# Patient Record
Sex: Male | Born: 1940 | Race: White | Hispanic: No | Smoking: Current every day smoker
Health system: Southern US, Community
[De-identification: ages and names within clinical notes are randomized; demographics above are authoritative.]

## PROBLEM LIST (undated history)

## (undated) DIAGNOSIS — R058 Other specified cough: Secondary | ICD-10-CM

## (undated) DIAGNOSIS — IMO0001 Reserved for inherently not codable concepts without codable children: Secondary | ICD-10-CM

## (undated) DIAGNOSIS — F17210 Nicotine dependence, cigarettes, uncomplicated: Secondary | ICD-10-CM

## (undated) DIAGNOSIS — R05 Cough: Secondary | ICD-10-CM

## (undated) DIAGNOSIS — R918 Other nonspecific abnormal finding of lung field: Secondary | ICD-10-CM

## (undated) DIAGNOSIS — C801 Malignant (primary) neoplasm, unspecified: Secondary | ICD-10-CM

## (undated) DIAGNOSIS — J9383 Other pneumothorax: Secondary | ICD-10-CM

## (undated) DIAGNOSIS — G479 Sleep disorder, unspecified: Secondary | ICD-10-CM

## (undated) DIAGNOSIS — Z85828 Personal history of other malignant neoplasm of skin: Secondary | ICD-10-CM

## (undated) DIAGNOSIS — R634 Abnormal weight loss: Secondary | ICD-10-CM

## (undated) DIAGNOSIS — K4090 Unilateral inguinal hernia, without obstruction or gangrene, not specified as recurrent: Secondary | ICD-10-CM

## (undated) DIAGNOSIS — C3490 Malignant neoplasm of unspecified part of unspecified bronchus or lung: Secondary | ICD-10-CM

## (undated) DIAGNOSIS — K5900 Constipation, unspecified: Secondary | ICD-10-CM

## (undated) DIAGNOSIS — IMO0002 Reserved for concepts with insufficient information to code with codable children: Secondary | ICD-10-CM

## (undated) DIAGNOSIS — I10 Essential (primary) hypertension: Secondary | ICD-10-CM

## (undated) DIAGNOSIS — J939 Pneumothorax, unspecified: Secondary | ICD-10-CM

## (undated) HISTORY — PX: WISDOM TOOTH EXTRACTION: SHX21

## (undated) HISTORY — DX: Other pneumothorax: J93.83

## (undated) HISTORY — PX: TONSILLECTOMY: SUR1361

## (undated) HISTORY — DX: Reserved for inherently not codable concepts without codable children: IMO0001

## (undated) HISTORY — DX: Reserved for concepts with insufficient information to code with codable children: IMO0002

## (undated) HISTORY — PX: COLONOSCOPY W/ BIOPSIES AND POLYPECTOMY: SHX1376

## (undated) HISTORY — DX: Nicotine dependence, cigarettes, uncomplicated: F17.210

## (undated) HISTORY — DX: Essential (primary) hypertension: I10

## (undated) HISTORY — DX: Abnormal weight loss: R63.4

---

## 1999-11-14 ENCOUNTER — Encounter: Payer: Self-pay | Admitting: Otolaryngology

## 1999-11-14 ENCOUNTER — Encounter: Admission: RE | Admit: 1999-11-14 | Discharge: 1999-11-14 | Payer: Self-pay | Admitting: Otolaryngology

## 2000-11-28 ENCOUNTER — Encounter (INDEPENDENT_AMBULATORY_CARE_PROVIDER_SITE_OTHER): Payer: Self-pay | Admitting: Specialist

## 2000-11-28 ENCOUNTER — Ambulatory Visit (HOSPITAL_COMMUNITY): Admission: RE | Admit: 2000-11-28 | Discharge: 2000-11-28 | Payer: Self-pay | Admitting: Surgery

## 2008-02-13 ENCOUNTER — Ambulatory Visit (HOSPITAL_COMMUNITY): Admission: RE | Admit: 2008-02-13 | Discharge: 2008-02-13 | Payer: Self-pay | Admitting: Surgery

## 2009-10-29 HISTORY — PX: HERNIA REPAIR: SHX51

## 2011-03-13 NOTE — Op Note (Signed)
NAMEJAVON, Gabriel [name] NO.:  [id number]   MEDICAL RECORD NO.:  [id number]          PATIENT TYPE:  AMB   LOCATION:  DAY                          FACILITY:  [facility]   PHYSICIAN:  Thornton Park. Daphine Deutscher, MD  DATE OF BIRTH:  February 01, 1941   DATE OF PROCEDURE:  02/13/2008  DATE OF DISCHARGE:                               OPERATIVE REPORT   PREOP DIAGNOSIS:  Right inguinal hernia.   POSTOP DIAGNOSIS:  Right inguinal hernia.   SURGEON:  Luretha Murphy, M.D.   ASSISTANT:  None.   ANESTHESIA:  General by LMA.   FINDINGS:  Right indirect inguinal hernia at a site previously treated  for right femoral hernia by Dr. Magnus Ivan.   DESCRIPTION OF PROCEDURE:  [name] was taken to room 11 at [facility] on Friday, February 13, 2008 and given general by LMA.  The inguinal  region was clipped and then prepped with Techni-Care and draped  sterilely.  An oblique incision was made and carried down where I upon  incised the external oblique and opened it up. I spared and separated  the ilioinguinal nerve branch medially and mobilized the cord. In the  medial portion of the cord I found the hernia sac which I dissected  free, opened and then I ligated it under direct vision with 2-0 silk and  then excised the excess sac.  I felt inside before doing that and I did  not feel a recurrent femoral hernia and could feel a piece of mesh plug  there and did not feel any other direct components.   I went ahead and placed three sutures of 2-0 Prolene in the internal  oblique muscles to tighten up the ring and then cut a piece of ultra Pro  mesh and sutured along the inguinal ligament medially and then brought  it around the cord structures to reinforce the floor.  This was then  tucked beneath the external oblique which was then closed with running 2-  0 Vicryl.  4-0 Vicryl was used in the subcutaneous tissue and then  subcuticularly 4-0 Vicryl was used and then Dermabond.  The patient  tolerated  the procedure well.  He will be given some Vicodin to take for  pain.  He will be followed-up in the office in 4-5 weeks.      Thornton Park Daphine Deutscher, MD  Electronically Signed     MBM/MEDQ  D:  02/13/2008  T:  02/13/2008  Job:  [id number]   cc:   Bernette Redbird, M.D.  Fax: [phone]

## 2011-03-16 NOTE — Op Note (Signed)
[facility]  Patient:    Gabriel [name], Gabriel [name]                      MRN: [id number] Proc. Date: 11/28/00 Adm. Date:  [id number] Attending:  Abigail Miyamoto A                           Operative Report  PREOPERATIVE DIAGNOSIS:  Lymphadenopathy of right groin.  POSTOPERATIVE DIAGNOSIS:  Right femoral hernia.  PROCEDURE:  Right groin exploration and repair of right femoral hernia with mesh.  SURGEON:  Abigail Miyamoto, M.D.  ANESTHESIA:  Local 1% lidocaine and monitored anesthesia care.  ESTIMATED BLOOD LOSS:  Minimal.  INDICATIONS:  Dayvian Blixt is a 70 year old gentleman who was found to have what appeared to be a large palpable lymph node in his right groin, which had been treated conservatively with observation and oral antibiotics, and seemed to be related to an infection he has had on his right buttock.  He had had no obstructive symptoms.  Because this mass stayed persistent, the decision was made to explore this area and excise the possible lymph node.  FINDINGS:  Instead of having a large lymph node, Mr. Kuenzel was found to have a femoral hernia with omentum.  There was only a small hernia defect, and no bowel was involved.  DESCRIPTION OF PROCEDURE:  The patient was brought to the operating room and identified as Maryelizabeth Rowan.  He was placed supine on the operating room table, and anesthesia was induced.  His right groin was then prepped and draped in the usual sterile fashion.  The area overlying the mass was then anesthetized with 1% lidocaine.  Incision was carried down to the mass with 15 scalpel.  The mass was dissected out further with electrocautery.  Upon finding the mass, it became evident that this represented a small femoral hernia sac rather than an enlarged lymph node.  The sac was dissected down easily to its base.  The base was then completely transected with the electrocautery, and the sac containing omentum was sent to  pathology.  The hernia defect was then plugged with a medium-sized Bard Prolene mesh plug. The inguinal ligament was then sewn down covering the defect with interrupted 2-0 Prolene sutures.  The wound was then thoroughly irrigated with normal saline.  Subcutaneous layer was then closed with interrupted 3-0 Vicryl sutures, and the skin was closed with running 4-0 Monocryl.  Steri-Strips, gauze, and tape were then applied.  The patient tolerated the procedure well. All sponge, needle and instrument counts were correct at the end of the procedure.  The patient was then taken in stable condition from the operating room to the recovery room. DD:  11/28/00 TD:  11/28/00 Job: [id number]

## 2011-07-24 LAB — BASIC METABOLIC PANEL
BUN: 16
CO2: 28
Calcium: 9.6
Chloride: 104
Creatinine, Ser: 1.03
GFR calc Af Amer: >60
GFR calc non Af Amer: >60
Glucose, Bld: 117 — ABNORMAL HIGH
Potassium: 4
Sodium: 139

## 2011-07-24 LAB — HEMOGLOBIN AND HEMATOCRIT, BLOOD
HCT: 47.4
Hemoglobin: 15.8

## 2013-02-05 ENCOUNTER — Other Ambulatory Visit: Payer: Self-pay | Admitting: Gastroenterology

## 2013-02-05 DIAGNOSIS — R110 Nausea: Secondary | ICD-10-CM

## 2013-02-09 ENCOUNTER — Ambulatory Visit
Admission: RE | Admit: 2013-02-09 | Discharge: 2013-02-09 | Disposition: A | Discharge status: Home / Self Care | Payer: 59 | Source: Ambulatory Visit | Attending: Gastroenterology | Admitting: Gastroenterology

## 2013-02-09 DIAGNOSIS — R110 Nausea: Secondary | ICD-10-CM

## 2013-10-05 ENCOUNTER — Encounter (HOSPITAL_COMMUNITY): Payer: Self-pay | Admitting: Emergency Medicine

## 2013-10-05 ENCOUNTER — Emergency Department (HOSPITAL_COMMUNITY)
Admission: EM | Admit: 2013-10-05 | Discharge: 2013-10-05 | Disposition: A | Discharge status: Home / Self Care | Payer: 59 | Source: Home / Self Care | Attending: Family Medicine | Admitting: Family Medicine

## 2013-10-05 DIAGNOSIS — J329 Chronic sinusitis, unspecified: Secondary | ICD-10-CM

## 2013-10-05 DIAGNOSIS — R0982 Postnasal drip: Secondary | ICD-10-CM

## 2013-10-05 NOTE — ED Notes (Signed)
C/o throat pain for six weeks States he notices spots of blood in the mucous  Admits to having nasal drainage

## 2013-10-05 NOTE — ED Provider Notes (Signed)
CSN: [id number]     Arrival date & time 10/05/13  1243 History   First MD Initiated Contact with Patient 10/05/13 1358     Chief Complaint  Patient presents with  . Dental Pain   (Consider location/radiation/quality/duration/timing/severity/associated sxs/prior Treatment) HPI Comments: Pt denies URI or other illness at time of onset of sx.  Sx did coincide with change in season and turning heat on in house.   Patient is a 72 y.o. male presenting with URI. The history is provided by the patient.  URI Presenting symptoms: no congestion, no cough, no fever, no rhinorrhea and no sore throat   Presenting symptoms comment:  Post nasal drainage with occasional small amounts of blood in it Severity:  Mild Onset quality:  Sudden Duration:  6 weeks Timing:  Sporadic Progression:  Worsening Chronicity:  New Relieved by:  None tried Worsened by:  Nothing tried Ineffective treatments:  None tried Associated symptoms: no sinus pain     History reviewed. No pertinent past medical history. History reviewed. No pertinent past surgical history. History reviewed. No pertinent family history. History  Substance Use Topics  . Smoking status: Not on file  . Smokeless tobacco: Not on file  . Alcohol Use: Not on file    Review of Systems  Constitutional: Negative for fever and chills.  HENT: Positive for postnasal drip. Negative for congestion, rhinorrhea, sinus pressure and sore throat.   Respiratory: Negative for cough.     Allergies  Aleve  Home Medications  No current outpatient prescriptions on file. BP 168/85  Pulse 95  Temp(Src) 97.6 F (36.4 C) (Oral)  Resp 16  SpO2 96% Physical Exam  Constitutional: He appears well-developed and well-nourished. No distress.  HENT:  Right Ear: Tympanic membrane, external ear and ear canal normal.  Left Ear: Tympanic membrane, external ear and ear canal normal.  Nose: No mucosal edema.  Mouth/Throat: Oropharynx is clear and moist.  Nasal  mucosa with friable areas, occasional slight bleeding in spots  Cardiovascular: Normal rate and regular rhythm.   Pulmonary/Chest: Effort normal and breath sounds normal.    ED Course  Procedures (including critical care time) Labs Review Labs Reviewed - No data to display Imaging Review No results found.  EKG Interpretation    Date/Time:    Ventricular Rate:    PR Interval:    QRS Duration:   QT Interval:    QTC Calculation:   R Axis:     Text Interpretation:              MDM   1. Post-nasal drainage   recommended saline nasal spray and humidifier  bp recheck is 168/85   Cathlyn Parsons, NP 10/05/13 1404  Cathlyn Parsons, NP 10/05/13 1405

## 2013-10-06 NOTE — ED Provider Notes (Signed)
Medical screening examination/treatment/procedure(s) were performed by a resident physician or non-physician practitioner and as the supervising physician I was immediately available for consultation/collaboration.  Clementeen Graham, MD    Rodolph Bong, MD 10/06/13 [phone]

## 2013-10-07 ENCOUNTER — Encounter: Payer: Self-pay | Admitting: Pulmonary Disease

## 2013-10-07 ENCOUNTER — Other Ambulatory Visit: Payer: Self-pay | Admitting: Pulmonary Disease

## 2013-10-07 ENCOUNTER — Ambulatory Visit (INDEPENDENT_AMBULATORY_CARE_PROVIDER_SITE_OTHER): Payer: 59 | Admitting: Pulmonary Disease

## 2013-10-07 ENCOUNTER — Ambulatory Visit
Admission: RE | Admit: 2013-10-07 | Discharge: 2013-10-07 | Disposition: A | Discharge status: Home / Self Care | Payer: 59 | Source: Ambulatory Visit | Attending: Internal Medicine | Admitting: Internal Medicine

## 2013-10-07 ENCOUNTER — Ambulatory Visit (INDEPENDENT_AMBULATORY_CARE_PROVIDER_SITE_OTHER)
Admission: RE | Admit: 2013-10-07 | Discharge: 2013-10-07 | Disposition: A | Discharge status: Home / Self Care | Payer: 59 | Source: Ambulatory Visit | Attending: Pulmonary Disease | Admitting: Pulmonary Disease

## 2013-10-07 ENCOUNTER — Telehealth: Payer: Self-pay | Admitting: Pulmonary Disease

## 2013-10-07 ENCOUNTER — Other Ambulatory Visit: Payer: Self-pay | Admitting: Internal Medicine

## 2013-10-07 VITALS — BP 172/82 | HR 104 | Temp 97.4°F | Ht 67.5 in | Wt 146.0 lb

## 2013-10-07 DIAGNOSIS — R042 Hemoptysis: Secondary | ICD-10-CM

## 2013-10-07 NOTE — Progress Notes (Signed)
Quick Note:  Pt aware of results. ______ 

## 2013-10-07 NOTE — Telephone Encounter (Signed)
    Result Note     Let pt know nothing of concern on cxr to explain blood in mucus. Good news.   Pt aware of results.

## 2013-10-07 NOTE — Progress Notes (Signed)
   Subjective:    Patient ID: Gabriel [name], male    DOB: 12-31-1940, 72 y.o.   MRN: [id number]  HPI The patient is a 72 year old male who comes in today for evaluation of hemoptysis. He has never had an issue with this before, and proximally 6 weeks ago began to notice specks of blood in the mucus after throat clearing. He has not had chest congestion or any significant cough, and does not feel this is coming out of his lungs. He denies any sinus pressure or purulent nasal mucus. He has been evaluated by a nurse practitioner who saw all areas of bleeding in the nose and recommended nasal saline spray for dryness. The patient has a long history of tobacco abuse, and has not had a recent chest x-ray. He is eating very well, and has not had any weight loss.   Review of Systems  Constitutional: Negative for fever and unexpected weight change.  HENT: Positive for nosebleeds and sinus pressure ( possible-- w/some blood in throat). Negative for congestion, dental problem, ear pain, postnasal drip, rhinorrhea, sneezing, sore throat and trouble swallowing.   Eyes: Negative for redness and itching.  Respiratory: Negative for cough, chest tightness, shortness of breath and wheezing.   Cardiovascular: Negative for palpitations and leg swelling.  Gastrointestinal: Negative for nausea and vomiting.  Genitourinary: Negative for dysuria.  Musculoskeletal: Negative for joint swelling.  Skin: Negative for rash.  Neurological: Negative for headaches.  Hematological: Does not bruise/bleed easily.  Psychiatric/Behavioral: Negative for dysphoric mood. The patient is not nervous/anxious.        Objective:   Physical Exam Constitutional:  Well developed, no acute distress  HENT:  Nares patent without discharge, but areas of erythema with mild bleeding/crusting noted.  Oropharynx without exudate, palate and uvula are normal.  No lesions seen in OP.   Eyes:  Perrla, eomi, no scleral icterus  Neck:  No JVD,  no TMG  Cardiovascular:  Normal rate, regular rhythm, no rubs or gallops.  No murmurs        Intact distal pulses  Pulmonary :  Normal breath sounds, no stridor or respiratory distress   No rales, rhonchi, or wheezing  Abdominal:  Soft, nondistended, bowel sounds present.  No tenderness noted.   Musculoskeletal:  No lower extremity edema noted.  Lymph Nodes:  No cervical lymphadenopathy noted  Skin:  No cyanosis noted  Neurologic:  Alert, appropriate, moves all 4 extremities without obvious deficit.         Assessment & Plan:

## 2013-10-07 NOTE — Patient Instructions (Signed)
Will check chest xray today for completeness.  Will call you with results. Use nasal saline spray to moisten and clean nasal passages. Work on total smoking cessation. followup with me as needed, but call if increasing quantity of blood that is persistent.

## 2013-10-07 NOTE — Assessment & Plan Note (Signed)
The patient has had a 6 week history of specks of blood in his mucus after throat clearing. He has some erythematous areas with local bruising in his nose, but no obvious oral pharyngeal abnormalities. His lungs are totally clear to auscultation, and he is denying any bronchitic symptoms. I suspect this is coming from his nose, but given his history of smoking, and he needs a chest x-ray for completeness. I have recommended nasal saline spray for dryness, and have given him a recipe to make his own at home. I have also stressed to the patient importance of contacting me if this continues to be an issue or if it worsens. I will call him with the results of his chest x-ray.

## 2015-04-04 ENCOUNTER — Inpatient Hospital Stay (HOSPITAL_COMMUNITY)
Admission: EM | Admit: 2015-04-04 | Discharge: 2015-04-08 | DRG: 201 | Disposition: A | Discharge status: Home / Self Care | Payer: 59 | Attending: Cardiothoracic Surgery | Admitting: Cardiothoracic Surgery

## 2015-04-04 ENCOUNTER — Encounter (HOSPITAL_COMMUNITY): Payer: Self-pay | Admitting: Emergency Medicine

## 2015-04-04 ENCOUNTER — Inpatient Hospital Stay (HOSPITAL_COMMUNITY): Payer: 59

## 2015-04-04 ENCOUNTER — Emergency Department (HOSPITAL_COMMUNITY): Payer: 59

## 2015-04-04 ENCOUNTER — Other Ambulatory Visit (HOSPITAL_COMMUNITY): Payer: Self-pay

## 2015-04-04 DIAGNOSIS — Z888 Allergy status to other drugs, medicaments and biological substances status: Secondary | ICD-10-CM | POA: Diagnosis not present

## 2015-04-04 DIAGNOSIS — Z6820 Body mass index (BMI) 20.0-20.9, adult: Secondary | ICD-10-CM

## 2015-04-04 DIAGNOSIS — J9383 Other pneumothorax: Principal | ICD-10-CM | POA: Diagnosis present

## 2015-04-04 DIAGNOSIS — R918 Other nonspecific abnormal finding of lung field: Secondary | ICD-10-CM | POA: Diagnosis present

## 2015-04-04 DIAGNOSIS — J9311 Primary spontaneous pneumothorax: Secondary | ICD-10-CM

## 2015-04-04 DIAGNOSIS — Z9689 Presence of other specified functional implants: Secondary | ICD-10-CM

## 2015-04-04 DIAGNOSIS — J939 Pneumothorax, unspecified: Secondary | ICD-10-CM | POA: Diagnosis present

## 2015-04-04 DIAGNOSIS — F17210 Nicotine dependence, cigarettes, uncomplicated: Secondary | ICD-10-CM | POA: Diagnosis present

## 2015-04-04 DIAGNOSIS — Z4682 Encounter for fitting and adjustment of non-vascular catheter: Secondary | ICD-10-CM

## 2015-04-04 DIAGNOSIS — R0602 Shortness of breath: Secondary | ICD-10-CM | POA: Diagnosis present

## 2015-04-04 LAB — COMPREHENSIVE METABOLIC PANEL
ALT: 18 U/L (ref 17–63)
AST: 22 U/L (ref 15–41)
Albumin: 3.8 g/dL (ref 3.5–5.0)
Alkaline Phosphatase: 72 U/L (ref 38–126)
Anion gap: 8 (ref 5–15)
BUN: 17 mg/dL (ref 6–20)
CO2: 26 mmol/L (ref 22–32)
Calcium: 8.9 mg/dL (ref 8.9–10.3)
Chloride: 103 mmol/L (ref 101–111)
Creatinine, Ser: 0.96 mg/dL (ref 0.61–1.24)
GFR calc Af Amer: >60 mL/min (ref >60)
GFR calc non Af Amer: >60 mL/min (ref >60)
Glucose, Bld: 89 mg/dL (ref 65–99)
Potassium: 3.9 mmol/L (ref 3.5–5.1)
Sodium: 137 mmol/L (ref 135–145)
Total Bilirubin: 0.7 mg/dL (ref 0.3–1.2)
Total Protein: 6.7 g/dL (ref 6.5–8.1)

## 2015-04-04 LAB — CBC WITH DIFFERENTIAL/PLATELET
Basophils Absolute: 0 10*3/uL (ref 0.0–0.1)
Basophils Relative: 0 % (ref 0–1)
EOS PCT: 2 % (ref 0–5)
Eosinophils Absolute: 0.2 10*3/uL (ref 0.0–0.7)
HEMATOCRIT: 46.8 % (ref 39.0–52.0)
Hemoglobin: 16.2 g/dL (ref 13.0–17.0)
LYMPHS ABS: 2.2 10*3/uL (ref 0.7–4.0)
LYMPHS PCT: 17 % (ref 12–46)
MCH: 30.4 pg (ref 26.0–34.0)
MCHC: 34.6 g/dL (ref 30.0–36.0)
MCV: 87.8 fL (ref 78.0–100.0)
MONOS PCT: 7 % (ref 3–12)
Monocytes Absolute: 0.9 10*3/uL (ref 0.1–1.0)
Neutro Abs: 9.5 10*3/uL — ABNORMAL HIGH (ref 1.7–7.7)
Neutrophils Relative %: 74 % (ref 43–77)
Platelets: 285 10*3/uL (ref 150–400)
RBC: 5.33 MIL/uL (ref 4.22–5.81)
RDW: 13.7 % (ref 11.5–15.5)
WBC: 12.9 10*3/uL — ABNORMAL HIGH (ref 4.0–10.5)

## 2015-04-04 LAB — CBC
HCT: 41.2 % (ref 39.0–52.0)
Hemoglobin: 14.4 g/dL (ref 13.0–17.0)
MCH: 30.5 pg (ref 26.0–34.0)
MCHC: 35 g/dL (ref 30.0–36.0)
MCV: 87.3 fL (ref 78.0–100.0)
Platelets: 228 10*3/uL (ref 150–400)
RBC: 4.72 MIL/uL (ref 4.22–5.81)
RDW: 13.7 % (ref 11.5–15.5)
WBC: 11.2 10*3/uL — ABNORMAL HIGH (ref 4.0–10.5)

## 2015-04-04 LAB — I-STAT CHEM 8, ED
BUN: 21 mg/dL — ABNORMAL HIGH (ref 6–20)
CALCIUM ION: 1.21 mmol/L (ref 1.13–1.30)
CHLORIDE: 100 mmol/L — AB (ref 101–111)
Creatinine, Ser: 1.1 mg/dL (ref 0.61–1.24)
Glucose, Bld: 109 mg/dL — ABNORMAL HIGH (ref 65–99)
HCT: 52 % (ref 39.0–52.0)
Hemoglobin: 17.7 g/dL — ABNORMAL HIGH (ref 13.0–17.0)
Potassium: 3.8 mmol/L (ref 3.5–5.1)
Sodium: 139 mmol/L (ref 135–145)
TCO2: 22 mmol/L (ref 0–100)

## 2015-04-04 LAB — I-STAT TROPONIN, ED: Troponin i, poc: 0 ng/mL (ref 0.00–0.08)

## 2015-04-04 LAB — BRAIN NATRIURETIC PEPTIDE: B Natriuretic Peptide: 53.8 pg/mL (ref 0.0–100.0)

## 2015-04-04 MED ORDER — SODIUM CHLORIDE 0.9 % IV SOLN: 250.0000 mL | INTRAVENOUS | Status: DC | PRN

## 2015-04-04 MED ORDER — ENOXAPARIN SODIUM 40 MG/0.4ML ~~LOC~~ SOLN
40.0000 mg | SUBCUTANEOUS | Status: DC
  Administered 2015-04-05 – 2015-04-07 (×3): 40 mg via SUBCUTANEOUS
  Filled 2015-04-04 (×4): qty 0.4

## 2015-04-04 MED ORDER — ACETAMINOPHEN 325 MG PO TABS: 650.0000 mg | ORAL_TABLET | Freq: Four times a day (QID) | ORAL | Status: DC | PRN

## 2015-04-04 MED ORDER — POLYETHYLENE GLYCOL 3350 17 G PO PACK
17.0000 g | PACK | Freq: Every day | ORAL | Status: DC | PRN
  Filled 2015-04-04: qty 1

## 2015-04-04 MED ORDER — OXYCODONE HCL 5 MG PO TABS
5.0000 mg | ORAL_TABLET | ORAL | Status: DC | PRN
  Administered 2015-04-05 – 2015-04-07 (×14): 5 mg via ORAL
  Filled 2015-04-04 (×14): qty 1

## 2015-04-04 MED ORDER — BISACODYL 5 MG PO TBEC
5.0000 mg | DELAYED_RELEASE_TABLET | Freq: Every day | ORAL | Status: DC | PRN
  Filled 2015-04-04: qty 1

## 2015-04-04 MED ORDER — LORAZEPAM 2 MG/ML IJ SOLN
2.0000 mg | Freq: Once | INTRAMUSCULAR | Status: AC
  Administered 2015-04-04: 1 mg via INTRAVENOUS
  Filled 2015-04-04: qty 1

## 2015-04-04 MED ORDER — SODIUM CHLORIDE 0.9 % IJ SOLN
3.0000 mL | Freq: Two times a day (BID) | INTRAMUSCULAR | Status: DC
  Administered 2015-04-05 – 2015-04-06 (×2): 3 mL via INTRAVENOUS

## 2015-04-04 MED ORDER — LIDOCAINE HCL (PF) 1 % IJ SOLN
30.0000 mL | Freq: Once | INTRAMUSCULAR | Status: AC
  Administered 2015-04-04: 30 mL
  Filled 2015-04-04: qty 30

## 2015-04-04 MED ORDER — MORPHINE SULFATE 2 MG/ML IJ SOLN
2.0000 mg | INTRAMUSCULAR | Status: DC | PRN
  Filled 2015-04-04: qty 1

## 2015-04-04 MED ORDER — ACETAMINOPHEN 650 MG RE SUPP: 650.0000 mg | Freq: Four times a day (QID) | RECTAL | Status: DC | PRN

## 2015-04-04 MED ORDER — SODIUM CHLORIDE 0.9 % IJ SOLN
3.0000 mL | INTRAMUSCULAR | Status: DC | PRN
  Administered 2015-04-04: 3 mL via INTRAVENOUS

## 2015-04-04 MED ORDER — SODIUM CHLORIDE 0.9 % IJ SOLN
3.0000 mL | Freq: Two times a day (BID) | INTRAMUSCULAR | Status: DC
  Administered 2015-04-04: 3 mL via INTRAVENOUS
  Administered 2015-04-05: 10 mL via INTRAVENOUS
  Administered 2015-04-05 – 2015-04-07 (×4): 3 mL via INTRAVENOUS

## 2015-04-04 MED ORDER — DOCUSATE SODIUM 100 MG PO CAPS
100.0000 mg | ORAL_CAPSULE | Freq: Two times a day (BID) | ORAL | Status: DC
  Administered 2015-04-05 – 2015-04-08 (×6): 100 mg via ORAL
  Filled 2015-04-04 (×10): qty 1

## 2015-04-04 MED ORDER — MORPHINE SULFATE 2 MG/ML IJ SOLN
2.0000 mg | Freq: Once | INTRAMUSCULAR | Status: AC
  Administered 2015-04-04: 2 mg via INTRAVENOUS
  Filled 2015-04-04: qty 1

## 2015-04-04 NOTE — Procedures (Signed)
      [address]       [place] [address]             [phone]      Chest Tube Insertion Procedure Note  Indications:  Clinically significant Pneumothorax  Pre-operative Diagnosis: Pneumothorax  Post-operative Diagnosis: Pneumothorax  Procedure Details  Informed consent was obtained for the procedure, including sedation.  Risks of lung perforation, hemorrhage, arrhythmia, and adverse drug reaction were discussed.   After sterile skin prep, using standard technique, a 28 French tube was placed in the right lateral 6 rib space.  Findings: None  Estimated Blood Loss:  Minimal         Specimens:  None              Complications:  None; patient tolerated the procedure well.         Disposition: admit 2w         Condition: stable  Attending Attestation: I performed the procedure.

## 2015-04-04 NOTE — H&P (Signed)
[address]       [name],Gabriel [name] [id number]             [phone]    Gabriel [name]   Chief Complaint: SOB right chest pain HPI: patient  Is 74 yo male long term smoker has some shortness of breath. Began this morning. He did have some slight right-sided chest pain . He felt that he pulled a muscle. He has had some shortness of breath since. No fevers.He's had a mild cough. He is in everyday smoker. He has a spontaneous pneumothorax.while in  [place] on the left . Brought back from triage chest x-ray showed hydropneumothorax on the right.  Past Medical History  Diagnosis Date  . Spontaneous pneumothorax     HISTORY OF (ONLY)    Past Surgical History  Procedure Laterality Date  . Hernia repair  2011    Family History  Problem Relation Age of Onset  . Heart attack Father   . Hypertension Mother   . Heart disease Father    Social History:  reports that he has been smoking Cigarettes.  He has a 41.25 pack-year smoking history. He does not have any smokeless tobacco history on file. He reports that he drinks alcohol. He reports that he does not use illicit drugs.  Allergies:  Allergies  Allergen Reactions  . Aleve [Naproxen] Rash     (Not in a hospital admission)  Results for orders placed or performed during the hospital encounter of 04/04/15 (from the past 48 hour(s))  CBC with Differential     Status: Abnormal   Collection Time: 04/04/15  6:31 PM  Result Value Ref Range   WBC 12.9 (H) 4.0 - 10.5 K/uL   RBC 5.33 4.22 - 5.81 MIL/uL   Hemoglobin 16.2 13.0 - 17.0 g/dL   HCT 46.8 39.0 - 52.0 %   MCV 87.8 78.0 - 100.0 fL   MCH 30.4 26.0 - 34.0 pg   MCHC 34.6 30.0 - 36.0 g/dL   RDW 13.7 11.5 - 15.5 %   Platelets 285 150 - 400 K/uL   Neutrophils Relative % 74 43 - 77 %   Neutro Abs 9.5 (H) 1.7 - 7.7 K/uL   Lymphocytes Relative 17 12 - 46 %   Lymphs Abs 2.2 0.7 - 4.0 K/uL   Monocytes Relative 7 3 - 12 %   Monocytes Absolute 0.9 0.1 - 1.0 K/uL   Eosinophils Relative 2 0 - 5 %   Eosinophils Absolute 0.2 0.0 - 0.7 K/uL   Basophils Relative 0 0 - 1 %   Basophils Absolute 0.0 0.0 - 0.1 K/uL  Brain natriuretic peptide     Status: None   Collection Time: 04/04/15  6:31 PM  Result Value Ref Range   B Natriuretic Peptide 53.8 0.0 - 100.0 pg/mL  I-Stat Troponin, ED (not at [facility])     Status: None   Collection Time: 04/04/15  6:38 PM  Result Value Ref Range   Troponin i, poc 0.00 0.00 - 0.08 ng/mL   Comment 3            Comment: Due to the release kinetics of cTnI, a negative result within the first hours of the onset of symptoms does not rule out myocardial infarction with certainty. If myocardial infarction is still suspected, repeat the test at appropriate intervals.   I-Stat Chem 8, ED     Status: Abnormal   Collection Time: 04/04/15  6:39 PM  Result  Value Ref Range   Sodium 139 135 - 145 mmol/L   Potassium 3.8 3.5 - 5.1 mmol/L   Chloride 100 (L) 101 - 111 mmol/L   BUN 21 (H) 6 - 20 mg/dL   Creatinine, Ser 1.10 0.61 - 1.24 mg/dL   Glucose, Bld 109 (H) 65 - 99 mg/dL   Calcium, Ion 1.21 1.13 - 1.30 mmol/L   TCO2 22 0 - 100 mmol/L   Hemoglobin 17.7 (H) 13.0 - 17.0 g/dL   HCT 52.0 39.0 - 52.0 %   Dg Chest 2 View  04/04/2015   CLINICAL DATA:  Productive cough for several months, acute shortness of Breath  EXAM: CHEST - 2 VIEW  COMPARISON:  10/07/2013  FINDINGS: Cardiac shadow is stable. A moderate to large right hydro pneumothorax is noted. The left lung is clear. Multiple radiopaque leads are noted over the chest wall. No acute bony abnormality is seen. Old healed rib fractures on the left are noted.  IMPRESSION: Moderate to large right hydro pneumothorax.  These results were called by telephone at the time of interpretation on 04/04/2015 at 7:49 pm to Dr. Alvino Chapel, who verbally acknowledged these results.   Electronically Signed   By: Inez Catalina M.D.   On: 04/04/2015 19:49    Review of Systems  Constitutional: Negative.   HENT:  Positive for congestion.   Eyes: Negative.   Respiratory: Positive for cough and shortness of breath. Negative for hemoptysis and sputum production.   Cardiovascular: Negative.   Gastrointestinal: Negative.   Genitourinary: Negative.   Musculoskeletal: Negative.   Skin: Negative.   Neurological: Negative.   Endo/Heme/Allergies: Negative.   Psychiatric/Behavioral: Negative.     Blood pressure 143/80, pulse 84, temperature 97.7 F (36.5 C), temperature source Oral, resp. rate 22, height '5\' 10"'$  (1.778 m), weight 140 lb 8 oz (63.73 kg), SpO2 92 %. Physical Exam  Constitutional: He is oriented to person, place, and time. He appears well-developed. He is not intubated.  HENT:  Mouth/Throat: No oropharyngeal exudate.  Neck: No JVD present. No tracheal deviation present. No thyromegaly present.  Cardiovascular: Normal rate, regular rhythm, normal heart sounds and intact distal pulses.   No murmur heard. Respiratory: Tachypnea noted. No apnea. He is not intubated. He is in respiratory distress. He has decreased breath sounds in the right middle field and the right lower field. He has no wheezes. He has no rhonchi. He has no rales.  GI: Soft. Bowel sounds are normal.  Musculoskeletal: Normal range of motion.  Neurological: He is alert and oriented to person, place, and time. No cranial nerve deficit.  Skin: Skin is warm.  Psychiatric: He has a normal mood and affect. His behavior is normal. Judgment and thought content normal.  left reduciable inguinal hernia  Assessment/Plan Right pneumothorax in long term smoker- plan admission and placement of right chest tube  Grace Isaac 04/04/2015, 10:40 PM

## 2015-04-04 NOTE — ED Notes (Signed)
Chest tube tray placed at bedside.

## 2015-04-04 NOTE — ED Provider Notes (Signed)
CSN: [id number]     Arrival date & time 04/04/15  1805 History   First MD Initiated Contact with Patient 04/04/15 1954     Chief Complaint  Patient presents with  . Shortness of Breath     (Consider location/radiation/quality/duration/timing/severity/associated sxs/prior Treatment) The history is provided by the patient.   patient has some shortness of breath. Began this morning. He did have some slight right-sided chest pain began when he was reaching backwards. He felt that he pulled a muscle. He has had some shortness of breath since. No fevers. He's had a mild cough. He is in everyday smoker. He has a spontaneous pneumothorax. He was in college. Brought back from triage chest x-ray showed hydropneumothorax on the right. Past Medical History  Diagnosis Date  . Spontaneous pneumothorax     HISTORY OF (ONLY)   Past Surgical History  Procedure Laterality Date  . Hernia repair  2011   Family History  Problem Relation Age of Onset  . Heart attack Father   . Hypertension Mother   . Heart disease Father    History  Substance Use Topics  . Smoking status: Current Every Day Smoker -- 0.75 packs/day for 55 years    Types: Cigarettes  . Smokeless tobacco: Not on file  . Alcohol Use: Yes     Comment: social    Review of Systems  Constitutional: Negative for activity change.  Respiratory: Positive for cough and shortness of breath.   Cardiovascular: Negative for chest pain and leg swelling.  Gastrointestinal: Negative for abdominal pain.  Genitourinary: Negative for flank pain.  Musculoskeletal: Negative for back pain.  Skin: Negative for wound.  Neurological: Negative for seizures.  Hematological: Negative for adenopathy.  Psychiatric/Behavioral: Negative for behavioral problems.      Allergies  Aleve  Home Medications   Prior to Admission medications   Medication Sig Start Date End Date Taking? Authorizing Provider  b complex vitamins tablet Take 1 tablet by mouth  daily.   Yes Historical Provider, MD  calcium-vitamin D (OSCAL WITH D) 500-200 MG-UNIT per tablet Take 1 tablet by mouth daily.   Yes Historical Provider, MD  Cyanocobalamin (VITAMIN B 12 PO) Take 1 tablet by mouth daily.   Yes Historical Provider, MD  Ginkgo Biloba 40 MG TABS Take 40 mg by mouth daily.   Yes Historical Provider, MD  L-Arginine 1000 MG TABS Take 1,000 mg by mouth daily.   Yes Historical Provider, MD  Multiple Vitamin (MULTI VITAMIN DAILY PO) Take 1 tablet by mouth daily.   Yes Historical Provider, MD  Omega-3 Fatty Acids (FISH OIL) 1000 MG CAPS Take 1,000 mg by mouth daily.   Yes Historical Provider, MD  vitamin A 10000 UNIT capsule Take 10,000 Units by mouth daily.   Yes Historical Provider, MD   BP 112/71 mmHg  Pulse 74  Temp(Src) 97.7 F (36.5 C) (Oral)  Resp 22  Ht '5\' 10"'$  (1.778 m)  Wt 140 lb 8 oz (63.73 kg)  BMI 20.16 kg/m2  SpO2 92% Physical Exam  Constitutional: He appears well-developed.  HENT:  Head: Normocephalic.  Cardiovascular: Normal rate and regular rhythm.   Pulmonary/Chest: Breath sounds normal. No respiratory distress. He has no wheezes. He has no rales.  Abdominal: Soft. There is no tenderness.  Musculoskeletal: Normal range of motion.  Neurological: He is alert.  Skin: Skin is warm.    ED Course  Procedures (including critical care time) Labs Review Labs Reviewed  CBC WITH DIFFERENTIAL/PLATELET - Abnormal; Notable for the  following:    WBC 12.9 (*)    Neutro Abs 9.5 (*)    All other components within normal limits  CBC - Abnormal; Notable for the following:    WBC 11.2 (*)    All other components within normal limits  I-STAT CHEM 8, ED - Abnormal; Notable for the following:    Chloride 100 (*)    BUN 21 (*)    Glucose, Bld 109 (*)    Hemoglobin 17.7 (*)    All other components within normal limits  BRAIN NATRIURETIC PEPTIDE  COMPREHENSIVE METABOLIC PANEL  I-STAT TROPOININ, ED    Imaging Review Dg Chest 2 View  04/04/2015    CLINICAL DATA:  Productive cough for several months, acute shortness of Breath  EXAM: CHEST - 2 VIEW  COMPARISON:  10/07/2013  FINDINGS: Cardiac shadow is stable. A moderate to large right hydro pneumothorax is noted. The left lung is clear. Multiple radiopaque leads are noted over the chest wall. No acute bony abnormality is seen. Old healed rib fractures on the left are noted.  IMPRESSION: Moderate to large right hydro pneumothorax.  These results were called by telephone at the time of interpretation on 04/04/2015 at 7:49 pm to Dr. Alvino Chapel, who verbally acknowledged these results.   Electronically Signed   By: Inez Catalina M.D.   On: 04/04/2015 19:49   Portable Chest 1 View  04/04/2015   CLINICAL DATA:  Pneumothorax.  Chest tube placement.  EXAM: PORTABLE CHEST - 1 VIEW  COMPARISON:  Earlier this day at 1859 hours  FINDINGS: Right chest tube, coursing medially tip adjacent pneumomediastinum. Decreased size of the right pneumothorax with small apical component persisting. Question of apical blebs. Mild atelectasis in the right lower lobe. Underlying emphysematous change. No acute abnormality in the left lung. Cardiomediastinal contours are unchanged. There are old left-sided rib fractures.  IMPRESSION: Decreased right pneumothorax post right-sided chest tube placement with small residual apical component. Question small right apical blebs.   Electronically Signed   By: Jeb Levering M.D.   On: 04/04/2015 23:32     EKG Interpretation   Date/Time:  Monday April 04 2015 18:20:49 EDT Ventricular Rate:  101 PR Interval:  154 QRS Duration: 94 QT Interval:  352 QTC Calculation: 456 R Axis:   91 Text Interpretation:  * Suspect arm lead reversal, interpretation  assumes no reversal Sinus tachycardia with Premature atrial complexes  Rightward axis Borderline ECG Confirmed by Alvino Chapel  MD, Ovid Curd [phone])  on 04/04/2015 8:38:29 PM      MDM   Final diagnoses:  Pneumothorax    Patient with  pneumothorax. Symptomatic with it but not hypoxic at rest. To be admitted to cardiothoracic surgery    Davonna Belling, MD 04/05/15 [phone]

## 2015-04-04 NOTE — ED Notes (Addendum)
Pt c/o shortness of breath onset earlier today,  Worse on exertion,  Pt denies any chest pain,  Pt speaking in full sentences

## 2015-04-04 NOTE — ED Notes (Signed)
Dr. Servando Snare inserting chest tube. Sterile technique used. Pt tolerated procedure well. Chest tube hooked to suction. Pts VSS and he was A&Ox4 throughout.

## 2015-04-05 ENCOUNTER — Inpatient Hospital Stay (HOSPITAL_COMMUNITY): Payer: 59

## 2015-04-05 DIAGNOSIS — J9311 Primary spontaneous pneumothorax: Secondary | ICD-10-CM

## 2015-04-05 NOTE — Progress Notes (Signed)
PATIENT ARRIVED TO TELE UNIT FROM E.D. VIA STRETCHER. AMBULATED TO BED. TELE APPLIED. VITALS OBTAINED. ASSESSMENT PERFORMED.  CHEST TUBE ATTACHED TO 20 CM SUCTION PER ORDER.  BLOODY DRAINAGE OBSERVED ON CHEST TUBE DRESSING. SITE REINFORCED WITH GAUZE AND TAPE.  PATIENT INSTRUCTED TO CALL FOR ASSISTANCE WHEN NEEDED. SON AND BELONGINGS AT BEDSIDE.

## 2015-04-05 NOTE — ED Notes (Signed)
Report attempted 

## 2015-04-05 NOTE — Progress Notes (Signed)
Utilization review completed.  

## 2015-04-05 NOTE — Care Management Note (Signed)
Case Management Note  Patient Details  Name: [name] MRN: [id number] Date of Birth: August 17, 1941  Subjective/Objective:    Pt admitted with Pntx and chest tube placement                Action/Plan:  PTA pt lived at home- anticipate return home- NCM to follow for d/c needs and planning  Expected Discharge Date:            Expected Discharge Plan:  Home/Self Care  In-House Referral:     Discharge planning Services  CM Consult  Post Acute Care Choice:    Choice offered to:     DME Arranged:    DME Agency:     HH Arranged:    HH Agency:     Status of Service:  In process, will continue to follow  Medicare Important Message Given:    Date Medicare IM Given:    Medicare IM give by:    Date Additional Medicare IM Given:    Additional Medicare Important Message give by:     If discussed at [place] of Stay Meetings, dates discussed:    Additional Comments:  Dawayne Patricia, RN 04/05/2015, 10:31 AM

## 2015-04-05 NOTE — Progress Notes (Addendum)
[address]       [place] [address]             [phone]      Subjective:  [name] has no complaints this morning.  States he is doing well.  Objective: Vital signs in last 24 hours: Temp:  [97.7 F (36.5 C)-98 F (36.7 C)] 97.8 F (36.6 C) (06/07 0455) Pulse Rate:  [74-103] 76 (06/07 0455) Cardiac Rhythm:  [-] Normal sinus rhythm (06/07 0113) Resp:  [13-24] 18 (06/07 0455) BP: (97-162)/(60-99) 97/62 mmHg (06/07 0455) SpO2:  [91 %-100 %] 93 % (06/07 0455) FiO2 (%):  [0 %] 0 % (06/06 2256) Weight:  [138 lb 3.7 oz (62.7 kg)-140 lb 8 oz (63.73 kg)] 138 lb 3.7 oz (62.7 kg) (06/07 0132)  Intake/Output from previous day: 06/06 0701 - 06/07 0700 In: 120 [P.O.:120] Out: -  Intake/Output this shift: Total I/O In: 240 [P.O.:240] Out: -   General appearance: alert, cooperative and no distress Heart: regular rate and rhythm Lungs: clear to auscultation bilaterally Abdomen: soft, non-tender; bowel sounds normal; no masses,  no organomegaly Wound: clean and dry  Lab Results:  Recent Labs  04/04/15 1831 04/04/15 1839 04/04/15 2302  WBC 12.9*  --  11.2*  HGB 16.2 17.7* 14.4  HCT 46.8 52.0 41.2  PLT 285  --  228   BMET:  Recent Labs  04/04/15 1839 04/04/15 2302  NA 139 137  K 3.8 3.9  CL 100* 103  CO2  --  26  GLUCOSE 109* 89  BUN 21* 17  CREATININE 1.10 0.96  CALCIUM  --  8.9    PT/INR: No results for input(s): LABPROT, INR in the last 72 hours. ABG    Component Value Date/Time   TCO2 22 04/04/2015 1839   CBG (last 3)  No results for input(s): GLUCAP in the last 72 hours.  Dg Chest 2 View  04/04/2015   CLINICAL DATA:  Productive cough for several months, acute shortness of Breath  EXAM: CHEST - 2 VIEW  COMPARISON:  10/07/2013  FINDINGS: Cardiac shadow is stable. A moderate to large right hydro pneumothorax is noted. The left lung is clear. Multiple radiopaque leads are noted over the chest wall. No acute bony abnormality is  seen. Old healed rib fractures on the left are noted.  IMPRESSION: Moderate to large right hydro pneumothorax.  These results were called by telephone at the time of interpretation on 04/04/2015 at 7:49 pm to Dr. Alvino Chapel, who verbally acknowledged these results.   Electronically Signed   By: Inez Catalina M.D.   On: 04/04/2015 19:49   Dg Chest Port 1 View  04/05/2015   CLINICAL DATA:  Pneumothorax.  EXAM: PORTABLE CHEST - 1 VIEW  COMPARISON:  04/04/2015.  FINDINGS: Right chest tube noted in stable position. Tiny right pneumothorax is decreased in size. Mediastinum and hilar structures are normal. Cardiomegaly with normal pulmonary vascularity. Persistent bibasilar atelectasis. No pleural effusion.  IMPRESSION: 1. Right chest tube in stable position. Tiny apical pneumothorax has almost completely resolved . 2. Persistent bibasilar atelectasis. 3. Stable cardiomegaly.   Electronically Signed   By: Marcello Moores  Register   On: 04/05/2015 07:56   Portable Chest 1 View  04/04/2015   CLINICAL DATA:  Pneumothorax.  Chest tube placement.  EXAM: PORTABLE CHEST - 1 VIEW  COMPARISON:  Earlier this day at 1859 hours  FINDINGS: Right chest tube, coursing medially tip adjacent pneumomediastinum. Decreased size of the right  pneumothorax with small apical component persisting. Question of apical blebs. Mild atelectasis in the right lower lobe. Underlying emphysematous change. No acute abnormality in the left lung. Cardiomediastinal contours are unchanged. There are old left-sided rib fractures.  IMPRESSION: Decreased right pneumothorax post right-sided chest tube placement with small residual apical component. Question small right apical blebs.   Electronically Signed   By: Jeb Levering M.D.   On: 04/04/2015 23:32    Assessment/Plan:  1. Spontaneous Pneumothorax- Right chest tube in place, shows resolution of pneumothorax- will leave on suction today 2. Dispo- patient stable, leave chest tube to suction today, repeat CXR in  AM   LOS: 1 day    Gabriel [name], [name] 04/05/2015  Not mention in xray report concerned about left upper lobe mass also, will get ct of chest  To evaluate No air  leak today I have seen and examined [name] and agree with the above assessment  and plan.  [name] Beeper [phone] Office [phone] 04/05/2015 11:17 AM

## 2015-04-06 ENCOUNTER — Other Ambulatory Visit: Payer: Self-pay

## 2015-04-06 ENCOUNTER — Inpatient Hospital Stay (HOSPITAL_COMMUNITY): Payer: 59

## 2015-04-06 DIAGNOSIS — J9311 Primary spontaneous pneumothorax: Secondary | ICD-10-CM

## 2015-04-06 DIAGNOSIS — R918 Other nonspecific abnormal finding of lung field: Secondary | ICD-10-CM

## 2015-04-06 NOTE — Progress Notes (Addendum)
      [address]       [place] [address]             [phone]            Subjective: Patient has no specific complaints this am.  Objective: Vital signs in last 24 hours: Temp:  [97.6 F (36.4 C)-98 F (36.7 C)] 98 F (36.7 C) (06/08 0522) Pulse Rate:  [75-83] 83 (06/08 0522) Cardiac Rhythm:  [-] Normal sinus rhythm (06/07 2000) Resp:  [18] 18 (06/08 0522) BP: (121-144)/(69-79) 121/69 mmHg (06/08 0522) SpO2:  [91 %-96 %] 91 % (06/08 0522)     Intake/Output from previous day: 06/07 0701 - 06/08 0700 In: 720 [P.O.:720] Out: 1350 [Urine:1350]   Physical Exam:  Cardiovascular: RRR Pulmonary: Clear to auscultation bilaterally; no rales, wheezes, or rhonchi. Abdomen: Soft, non tender, bowel sounds present. Extremities: No lower extremity edema. Wounds: Clean and dry.  No erythema or signs of infection. Chest Tube: to suction and no air leak  Lab Results: CBC: Recent Labs  04/04/15 1831 04/04/15 1839 04/04/15 2302  WBC 12.9*  --  11.2*  HGB 16.2 17.7* 14.4  HCT 46.8 52.0 41.2  PLT 285  --  228   BMET:  Recent Labs  04/04/15 1839 04/04/15 2302  NA 139 137  K 3.8 3.9  CL 100* 103  CO2  --  26  GLUCOSE 109* 89  BUN 21* 17  CREATININE 1.10 0.96  CALCIUM  --  8.9     PT/INR: No results for input(s): LABPROT, INR in the last 72 hours. ABG:  INR: Will add last result for INR, ABG once components are confirmed Will add last 4 CBG results once components are confirmed  Assessment/Plan:  1. CV - SR 2.  Pulmonary - Chest tube is to suction. There is no air leak. CXR this am shows no pneumothorax, basilar atelectasis, and bullous changes in bilateral upper lobes. CT done yesterday showed a 4.7 cm spiculated mass like opacity in central RLL withinvolvement of the right hilum/ right hilar lymphadenopathy, 5% right apical pneumothorax, moderate to severe emphysema with sub pleural blebs in both lung apices. Dr. Servando Snare to discuss CT  findings with patient. Likely place chest tube to water seal.  ZIMMERMAN,DONIELLE MPA-C 04/06/2015,8:13 AM  No air leak, chest tube to water seal today . 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 Discussed getting patient home and PET scan before planning BX depending PET findings I have seen and examined Gabriel [name] and agree with the above assessment  and plan.  [name] Beeper [phone] Office [phone] 04/06/2015 1:02 PM

## 2015-04-06 NOTE — Care Management Note (Signed)
Case Management Note  Patient Details  Name: Gabriel [name] MRN: [id number] Date of Birth: 1941-03-02  Subjective/Objective:  Pt admitted with Right pneumothorax - chest tube placed                  Action/Plan: PTA pt lived at home  Expected Discharge Date:  04/08/15               Expected Discharge Plan:  Home/Self Care  In-House Referral:     Discharge planning Services  CM Consult  Post Acute Care Choice:    Choice offered to:     DME Arranged:    DME Agency:     HH Arranged:    [place] Agency:     Status of Service:  In process, will continue to follow  Medicare Important Message Given:    Date Medicare IM Given:    Medicare IM give by:    Date Additional Medicare IM Given:    Additional Medicare Important Message give by:     If discussed at [place] of Stay Meetings, dates discussed:    Additional Comments: Asked to see pt regarding PCP needs- pt has [place] # given to pt with instructions for use.   [name] Chisholm, RN- [phone] 04/06/2015, 10:58 AM

## 2015-04-07 ENCOUNTER — Inpatient Hospital Stay (HOSPITAL_COMMUNITY): Payer: 59

## 2015-04-07 NOTE — Discharge Summary (Signed)
[address]       [name],Gabriel [name]             [phone]              Discharge Summary  Name: Gabriel [name] DOB: [birth year] 74 y.o. MRN: [id number]   Admission Date: 04/04/2015 Discharge Date: 04/08/2015    Admitting Diagnosis: Right spontaneous pneumothorax   Discharge Diagnosis:  Right spontaneous pneumothorax Right lower lobe mass  Past Medical History  Diagnosis Date  . Spontaneous pneumothorax     HISTORY OF (ONLY)     Procedures: Right 28 Fr chest tube placement - 04/04/2015    HPI:  The patient is a 74 y.o. male who presented to the ER at [name] on the date of admission complaining of right sided chest pain that started earlier in the day. He thought he had pulled a muscle initially, then developed some shortness of breath. He does have a history of a remote left pneumothorax.  A chest x-ray revealed a moderate to large right pneumothorax.  Thoracic surgery was consulted for evaluation.  Gabriel [name] saw the patient and placed a right chest tube at the bedside.  The patient was subsequently admitted for chest tube management.    Hospital Course:  The patient was admitted to [name] on 04/04/2015.  Repeat chest x-ray following chest tube placement showed resolution of his pneumothorax.  The chest tube has been managed conservatively and has remained stable with no air leak.  Suction was gradually weaned and the chest tube was ultimately discontinued on 04/07/2015.  Also, after review of his initial chest x-ray, [name] a right upper lobe mass.  Due to concern over this, a CT of the chest was performed which revealed a 4.7 cm spiculated mass-like opacity in the central right lower lobe which is highly suspicious for primary bronchogenic carcinoma, as well as indeterminate ground glass and tiny solitary pulmonary nodules in the left upper and lower lobes.  We will plan to further evaluate these with an outpatient PET scan  post-discharge.    The patient is overall progressing well. Follow up chest x-rays have been stable with no recurrent pneumothorax.  He is ambulating in the halls without difficulty and has been weaned from supplemental oxygen.  He is medically stable for discharge on todays date.   Recent vital signs:  Filed Vitals:   04/08/15 0347  BP: 122/78  Pulse: 75  Temp: 97.8 F (36.6 C)  Resp:     Recent laboratory studies:  CBC:No results for input(s): WBC, HGB, HCT, PLT in the last 72 hours. BMET: No results for input(s): NA, K, CL, CO2, GLUCOSE, BUN, CREATININE, CALCIUM in the last 72 hours.  PT/INR: No results for input(s): LABPROT, INR in the last 72 hours.   Discharge Medications:     Medication List    TAKE these medications        b complex vitamins tablet  Take 1 tablet by mouth daily.     calcium-vitamin D 500-200 MG-UNIT per tablet  Commonly known as:  OSCAL WITH D  Take 1 tablet by mouth daily.     Fish Oil 1000 MG Caps  Take 1,000 mg by mouth daily.     Ginkgo Biloba 40 MG Tabs  Take 40 mg by mouth daily.     L-Arginine 1000 MG Tabs  Take 1,000 mg by mouth daily.     MULTI VITAMIN DAILY PO  Take 1 tablet by mouth daily.     oxyCODONE 5 MG immediate release tablet  Commonly known as:  Oxy IR/ROXICODONE  Take 1 tablet (5 mg total) by mouth every 4 (four) hours as needed for moderate pain.     vitamin A 10000 UNIT capsule  Take 10,000 Units by mouth daily.     VITAMIN B 12 PO  Take 1 tablet by mouth daily.         Discharge Instructions:  The patient is to refrain from driving, heavy lifting or strenuous activity.  May shower daily and clean incisions with soap and water.  May resume regular diet.    Follow Up:  Follow-up Information    Follow up with [facility].   Contact information:   please call [phone] for assistance with finding primary care doctor      Follow up On 04/12/2015.   Why:  [facility] at 12:00.  Please have nothing to eat or drink after 6:00 am that morning.       Follow up with [name].   Specialty:  Cardiothoracic Surgery   Why:  Office will contact you with the results of your scan and will make a follow up appointment at that time   Contact information:   [place] [address] [phone]       Follow up with [facility] In 1 week.   Specialty:  Cardiothoracic Surgery   Why:  For suture removal, office will contact you with appointment   Contact information:   [place], [place], [place] 04/08/2015, 8:08 AM

## 2015-04-07 NOTE — Progress Notes (Addendum)
      [address]       [place] [address]             [phone]      Subjective:  Mr. Casaus has no complaints this morning.  He wants his telemetry pack removed  Objective: Vital signs in last 24 hours: Temp:  [97.7 F (36.5 C)-98 F (36.7 C)] 97.7 F (36.5 C) (06/09 0431) Pulse Rate:  [71-75] 71 (06/09 0431) Cardiac Rhythm:  [-] Normal sinus rhythm (06/08 1930) Resp:  [18-20] 18 (06/09 0431) BP: (128-145)/(74-78) 145/75 mmHg (06/09 0431) SpO2:  [93 %-95 %] 93 % (06/09 0431)  Intake/Output from previous day: 06/08 0701 - 06/09 0700 In: 360 [P.O.:360] Out: 215 [Urine:200; Chest Tube:15]   General appearance: alert, cooperative and no distress Heart: regular rate and rhythm Lungs: clear to auscultation bilaterally Abdomen: soft, non-tender; bowel sounds normal; no masses,  no organomegaly Wound: clean and dry  Lab Results:  Recent Labs  04/04/15 1831 04/04/15 1839 04/04/15 2302  WBC 12.9*  --  11.2*  HGB 16.2 17.7* 14.4  HCT 46.8 52.0 41.2  PLT 285  --  228   BMET:  Recent Labs  04/04/15 1839 04/04/15 2302  NA 139 137  K 3.8 3.9  CL 100* 103  CO2  --  26  GLUCOSE 109* 89  BUN 21* 17  CREATININE 1.10 0.96  CALCIUM  --  8.9    PT/INR: No results for input(s): LABPROT, INR in the last 72 hours. ABG    Component Value Date/Time   TCO2 22 04/04/2015 1839   CBG (last 3)  No results for input(s): GLUCAP in the last 72 hours.  Assessment/Plan:  1. Chest tube- no air leak, CXR no completed, nursing said duplicate order so night nurse just didn't get an CXR performed  2. Pulm- Bilateral Lung Nodules, likely cancer, not on oxygen, continue IS- will require workup with PET as outpatient 3. D/C Telemetry 4. Dispo- patient stable, will await CXR if stable will remove chest tube today, likely d/c home tomorrow   LOS: 3 days    BARRETT, ERIN 04/07/2015  Discussed work up and bx with patient. PET scan early next week as  outpatient I have seen and examined [name] and agree with the above assessment  and plan.  [name] Beeper [phone] Office [phone] 04/07/2015 8:43 AM

## 2015-04-08 ENCOUNTER — Inpatient Hospital Stay (HOSPITAL_COMMUNITY): Payer: 59

## 2015-04-08 MED ORDER — OXYCODONE HCL 5 MG PO TABS: 5.0000 mg | ORAL_TABLET | ORAL | Status: DC | PRN

## 2015-04-08 NOTE — Discharge Instructions (Signed)
Pneumothorax °A pneumothorax, commonly called a collapsed lung, is a condition in which air leaks from a lung and builds up in the space between the lung and the chest wall (pleural space). The air in a pneumothorax is trapped outside the lung and takes up space, preventing the lung from fully expanding. This is a condition that usually occurs suddenly. The buildup of air may be small or large. A small pneumothorax may go away on its own. When a pneumothorax is larger, it will often require medical treatment and hospitalization.  °CAUSES  °A pneumothorax can sometimes happen quickly with no apparent cause. People with underlying lung problems, particularly COPD or emphysema, are at higher risk of pneumothorax. However, pneumothorax can happen quickly even in people with no prior known lung problems. Trauma, surgery, medical procedures, or injury to the chest wall can also cause a pneumothorax. °SIGNS AND SYMPTOMS  °Sometimes a pneumothorax will have no symptoms. When symptoms are present, they can include: °· Chest pain. °· Shortness of breath. °· Increased rate of breathing. °· Bluish color to your lips or skin (cyanosis). °DIAGNOSIS  °Pneumothorax is usually diagnosed by a chest X-ray or chest CT scan. Your health care provider will also take a medical history and perform a physical exam to determine why you may have a pneumothorax. °TREATMENT  °A small pneumothorax may go away on its own without treatment. Extra oxygen can sometimes help a small pneumothorax go away more quickly. For a larger pneumothorax or a pneumothorax that is causing symptoms, a procedure is usually needed to drain the air. In some cases, the health care provider may drain the air using a needle. In other cases, a chest tube may be inserted into the pleural space. A chest tube is a small tube placed between the ribs and into the pleural space. This removes the extra air and allows the lung to expand back to its normal size. A large  pneumothorax will usually require a hospital stay. If there is ongoing air leakage into the pleural space, then the chest tube may need to remain in place for several days until the air leak has healed. In some cases, surgery may be needed.  °HOME CARE INSTRUCTIONS  °· Only take over-the-counter or prescription medicines as directed by your health care provider. °· If a cough or pain makes it difficult for you to sleep at night, try sleeping in a semi-upright position in a recliner or by using 2 or 3 pillows. °· Rest and limit activity as directed by your health care provider. °· If you had a chest tube and it was removed, ask your health care provider when it is okay to remove the dressing. Until your health care provider says you can remove the dressing, do not allow it to get wet. °· Do not smoke. Smoking is a risk factor for pneumothorax. °· Do not fly in an airplane or scuba dive until your health care provider says it is okay. °· Follow up with your health care provider as directed. °SEEK IMMEDIATE MEDICAL CARE IF:  °· You have increasing chest pain or shortness of breath. °· You have a cough that is not controlled with suppressants. °· You begin coughing up blood. °· You have pain that is getting worse or is not controlled with medicines. °· You cough up thick, discolored mucus (sputum) that is yellow to green in color. °· You have redness, increasing pain, or discharge at the site where a chest tube had been in place (if   your pneumothorax was treated with a chest tube). °· The site where your chest tube was located opens up. °· You feel air coming out of the site where the chest tube was placed. °· You have a fever or persistent symptoms for more than 2-3 days. °· You have a fever and your symptoms suddenly get worse. °MAKE SURE YOU:  °· Understand these instructions. °· Will watch your condition. °· Will get help right away if you are not doing well or get worse. °Document Released: 10/15/2005 Document  Revised: 08/05/2013 Document Reviewed: 05/14/2013 °ExitCare® Patient Information ©2015 [employer]. This information is not intended to replace advice given to you by your health care provider. Make sure you discuss any questions you have with your health care provider. ° °

## 2015-04-08 NOTE — Progress Notes (Signed)
Patient was adamant about walking out of the hospital and denied a wheel chair. Pt steady and stable

## 2015-04-08 NOTE — Progress Notes (Signed)
      [address]       [place] [address]             [phone]      Subjective:  Gabriel [name] has no complaints this morning.  He is ready to go home.  Objective: Vital signs in last 24 hours: Temp:  [97.5 F (36.4 C)-97.8 F (36.6 C)] 97.8 F (36.6 C) (06/10 0347) Pulse Rate:  [75-79] 75 (06/10 0347) Cardiac Rhythm:  [-]  Resp:  [19] 19 (06/09 2008) BP: (122-156)/(78-88) 122/78 mmHg (06/10 0347) SpO2:  [92 %-96 %] 92 % (06/10 0347)  Intake/Output from previous day: 06/09 0701 - 06/10 0700 In: 720 [P.O.:720] Out: 85 [Chest Tube:85]  General appearance: alert, cooperative and no distress Heart: regular rate and rhythm Lungs: clear to auscultation bilaterally Abdomen: soft, non-tender; bowel sounds normal; no masses,  no organomegaly Wound: clean and dry  Lab Results: No results for input(s): WBC, HGB, HCT, PLT in the last 72 hours. BMET: No results for input(s): NA, K, CL, CO2, GLUCOSE, BUN, CREATININE, CALCIUM in the last 72 hours.  PT/INR: No results for input(s): LABPROT, INR in the last 72 hours. ABG    Component Value Date/Time   TCO2 22 04/04/2015 1839   CBG (last 3)  No results for input(s): GLUCAP in the last 72 hours.  Assessment/Plan: S/P  Chest Tube Placement  1. Chest tube- removed yesterday, no pneumothorax on CXR 2. 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 Dispo- patient stable, will d/c home today    LOS: 4 days    Ahmed Prima, Burnette Valenti 04/08/2015

## 2015-04-11 ENCOUNTER — Ambulatory Visit: Payer: 59

## 2015-04-12 ENCOUNTER — Ambulatory Visit: Payer: 59 | Admitting: Cardiothoracic Surgery

## 2015-04-12 ENCOUNTER — Ambulatory Visit
Admit: 2015-04-12 | Discharge: 2015-04-12 | Disposition: A | Discharge status: Home / Self Care | Payer: 59 | Source: Ambulatory Visit | Attending: Cardiothoracic Surgery | Admitting: Cardiothoracic Surgery

## 2015-04-12 ENCOUNTER — Other Ambulatory Visit: Payer: Self-pay | Admitting: *Deleted

## 2015-04-12 DIAGNOSIS — R918 Other nonspecific abnormal finding of lung field: Secondary | ICD-10-CM

## 2015-04-12 HISTORY — DX: Malignant (primary) neoplasm, unspecified: C80.1

## 2015-04-12 LAB — GLUCOSE, CAPILLARY: Glucose-Capillary: 95 mg/dL (ref 65–99)

## 2015-04-12 MED ORDER — FLUDEOXYGLUCOSE F - 18 (FDG) INJECTION
12.6100 | Freq: Once | INTRAVENOUS | Status: AC | PRN
  Administered 2015-04-12: 12.61 via INTRAVENOUS

## 2015-04-14 ENCOUNTER — Encounter (HOSPITAL_COMMUNITY): Payer: Self-pay | Admitting: *Deleted

## 2015-04-14 NOTE — Progress Notes (Signed)
Pt denies SOB, chest pain, and being under the care of a cardiologist. Pt denies having a stress test, echo and cardiac cath. Pt made aware to stop taking Aspirin, otc vitamins and herbal medications ( Fish oil, Ginko, L- Arginine) Do not take any NSAIDs ie: Ibuprofen, Advil, Naproxen or any medication containing Aspirin. Pt verbalized understanding of all pre-op instructions.

## 2015-04-15 NOTE — Progress Notes (Signed)
Anesthesia Chart Review: SAME DAY WORK-UP.  Patient is a 74 year old male scheduled for video bronchoscopy with EBUS on 04/18/15 by Dr. Servando Snare.  History includes smoking, emphysema, left spontaneous PTX (remote), skin cancer, right IHR '09, tonsillectomy. He was admitted to [facility] 04/04/15 with a spontaneous right PTX s/p CT placement (d/c'd 04/07/15). During his hospitalization chest CT showed a 4.7 cm spiculated mass in the central RLL with right hilum/hilar lymphadenopathy and indeterminate pulmonary nodules in the LUL and LLL.   04/12/15 PET scan: ADDENDED IMPRESSION: 4.0 x 3.0 cm hypermetabolic right perihilar mass-like opacity, suspicious for primary bronchogenic neoplasm, as described above. Consider bronchoscopy for further evaluation. Multiple nodular/cavitary opacities in the left lung, including a dominant 2.3 cm cavitary lesion in the superior left lower lobe which is considered is suspicious for possible synchronous primary bronchogenic neoplasm. Given the presence of multiple lesions, consider short-term follow-up in 4-6 weeks after appropriate antimicrobial therapy to assess for resolution.  04/04/15 EKG: ST at 101 bpm, PACs, rightward axis. R waves are lower in aVL and V3 since his 02/13/08 EKG ([facility]).  Labs from 04/04/15 noted. He is for repeat CXR on the day of surgery.  No CP or SOB reported during PAT phone interview.  Further evaluation by his assigned anesthesiologist on the day of surgery, but if no acute changes then I would anticipate that he could proceed as planned.  [facility]/Anesthesiology Phone [phone] 04/15/2015 9:40 AM

## 2015-04-18 ENCOUNTER — Ambulatory Visit (HOSPITAL_COMMUNITY): Payer: 59

## 2015-04-18 ENCOUNTER — Encounter (HOSPITAL_COMMUNITY)
Admission: RE | Disposition: A | Discharge status: Home / Self Care | Payer: Self-pay | Source: Ambulatory Visit | Attending: Cardiothoracic Surgery

## 2015-04-18 ENCOUNTER — Encounter: Payer: Self-pay | Admitting: *Deleted

## 2015-04-18 ENCOUNTER — Ambulatory Visit (HOSPITAL_COMMUNITY)
Admission: RE | Admit: 2015-04-18 | Discharge: 2015-04-18 | Disposition: A | Discharge status: Home / Self Care | Payer: 59 | Source: Ambulatory Visit | Attending: Cardiothoracic Surgery | Admitting: Cardiothoracic Surgery

## 2015-04-18 ENCOUNTER — Ambulatory Visit (HOSPITAL_COMMUNITY): Payer: 59 | Admitting: Vascular Surgery

## 2015-04-18 ENCOUNTER — Telehealth: Payer: Self-pay | Admitting: *Deleted

## 2015-04-18 ENCOUNTER — Encounter (HOSPITAL_COMMUNITY): Payer: Self-pay | Admitting: Anesthesiology

## 2015-04-18 DIAGNOSIS — R918 Other nonspecific abnormal finding of lung field: Secondary | ICD-10-CM

## 2015-04-18 DIAGNOSIS — C342 Malignant neoplasm of middle lobe, bronchus or lung: Secondary | ICD-10-CM | POA: Insufficient documentation

## 2015-04-18 DIAGNOSIS — F17210 Nicotine dependence, cigarettes, uncomplicated: Secondary | ICD-10-CM | POA: Insufficient documentation

## 2015-04-18 DIAGNOSIS — R222 Localized swelling, mass and lump, trunk: Secondary | ICD-10-CM | POA: Diagnosis not present

## 2015-04-18 DIAGNOSIS — T819XXA Unspecified complication of procedure, initial encounter: Secondary | ICD-10-CM

## 2015-04-18 HISTORY — DX: Unilateral inguinal hernia, without obstruction or gangrene, not specified as recurrent: K40.90

## 2015-04-18 HISTORY — PX: VIDEO BRONCHOSCOPY WITH ENDOBRONCHIAL ULTRASOUND: SHX6177

## 2015-04-18 HISTORY — DX: Other nonspecific abnormal finding of lung field: R91.8

## 2015-04-18 LAB — COMPREHENSIVE METABOLIC PANEL
ALT: 20 U/L (ref 17–63)
AST: 21 U/L (ref 15–41)
Albumin: 3.7 g/dL (ref 3.5–5.0)
Alkaline Phosphatase: 69 U/L (ref 38–126)
Anion gap: 9 (ref 5–15)
BUN: 17 mg/dL (ref 6–20)
CO2: 24 mmol/L (ref 22–32)
Calcium: 9.4 mg/dL (ref 8.9–10.3)
Chloride: 104 mmol/L (ref 101–111)
Creatinine, Ser: 0.89 mg/dL (ref 0.61–1.24)
GFR calc Af Amer: >60 mL/min (ref >60)
GFR calc non Af Amer: >60 mL/min (ref >60)
Glucose, Bld: 117 mg/dL — ABNORMAL HIGH (ref 65–99)
Potassium: 3.9 mmol/L (ref 3.5–5.1)
Sodium: 137 mmol/L (ref 135–145)
Total Bilirubin: 0.7 mg/dL (ref 0.3–1.2)
Total Protein: 6.9 g/dL (ref 6.5–8.1)

## 2015-04-18 LAB — CBC
HCT: 44.5 % (ref 39.0–52.0)
Hemoglobin: 15.4 g/dL (ref 13.0–17.0)
MCH: 30.1 pg (ref 26.0–34.0)
MCHC: 34.6 g/dL (ref 30.0–36.0)
MCV: 86.9 fL (ref 78.0–100.0)
Platelets: 265 10*3/uL (ref 150–400)
RBC: 5.12 MIL/uL (ref 4.22–5.81)
RDW: 13.6 % (ref 11.5–15.5)
WBC: 8.8 10*3/uL (ref 4.0–10.5)

## 2015-04-18 LAB — PROTIME-INR
INR: 1.07 (ref 0.00–1.49)
Prothrombin Time: 14.1 seconds (ref 11.6–15.2)

## 2015-04-18 LAB — APTT: aPTT: 28 seconds (ref 24–37)

## 2015-04-18 SURGERY — BRONCHOSCOPY, WITH EBUS
Anesthesia: General

## 2015-04-18 MED ORDER — GLYCOPYRROLATE 0.2 MG/ML IJ SOLN
INTRAMUSCULAR | Status: DC | PRN
  Administered 2015-04-18: .8 mg via INTRAVENOUS

## 2015-04-18 MED ORDER — OXYCODONE HCL 5 MG PO TABS
5.0000 mg | ORAL_TABLET | Freq: Once | ORAL | Status: DC | PRN
Start: 2015-04-18 — End: 2015-04-18

## 2015-04-18 MED ORDER — LIDOCAINE HCL (CARDIAC) 20 MG/ML IV SOLN
INTRAVENOUS | Status: AC
  Filled 2015-04-18: qty 5

## 2015-04-18 MED ORDER — DEXAMETHASONE SODIUM PHOSPHATE 4 MG/ML IJ SOLN
INTRAMUSCULAR | Status: DC | PRN
  Administered 2015-04-18: 8 mg via INTRAVENOUS

## 2015-04-18 MED ORDER — ONDANSETRON HCL 4 MG/2ML IJ SOLN
INTRAMUSCULAR | Status: AC
  Filled 2015-04-18: qty 2

## 2015-04-18 MED ORDER — PHENYLEPHRINE HCL 10 MG/ML IJ SOLN
INTRAMUSCULAR | Status: DC | PRN
  Administered 2015-04-18 (×4): 80 ug via INTRAVENOUS
  Administered 2015-04-18: 120 ug via INTRAVENOUS
  Administered 2015-04-18: 40 ug via INTRAVENOUS

## 2015-04-18 MED ORDER — ONDANSETRON HCL 4 MG/2ML IJ SOLN: 4.0000 mg | Freq: Once | INTRAMUSCULAR | Status: DC | PRN

## 2015-04-18 MED ORDER — HYDROMORPHONE HCL 1 MG/ML IJ SOLN: 0.2500 mg | INTRAMUSCULAR | Status: DC | PRN

## 2015-04-18 MED ORDER — PROPOFOL 10 MG/ML IV BOLUS
INTRAVENOUS | Status: AC
  Filled 2015-04-18: qty 20

## 2015-04-18 MED ORDER — FENTANYL CITRATE (PF) 100 MCG/2ML IJ SOLN
INTRAMUSCULAR | Status: DC | PRN
  Administered 2015-04-18: 150 ug via INTRAVENOUS

## 2015-04-18 MED ORDER — NEOSTIGMINE METHYLSULFATE 10 MG/10ML IV SOLN
INTRAVENOUS | Status: DC | PRN
  Administered 2015-04-18: 5 mg via INTRAVENOUS

## 2015-04-18 MED ORDER — MIDAZOLAM HCL 5 MG/5ML IJ SOLN
INTRAMUSCULAR | Status: DC | PRN
  Administered 2015-04-18: 2 mg via INTRAVENOUS

## 2015-04-18 MED ORDER — MIDAZOLAM HCL 2 MG/2ML IJ SOLN
INTRAMUSCULAR | Status: AC
  Filled 2015-04-18: qty 2

## 2015-04-18 MED ORDER — FENTANYL CITRATE (PF) 250 MCG/5ML IJ SOLN
INTRAMUSCULAR | Status: AC
  Filled 2015-04-18: qty 5

## 2015-04-18 MED ORDER — LACTATED RINGERS IV SOLN
INTRAVENOUS | Status: DC | PRN
  Administered 2015-04-18 (×2): via INTRAVENOUS

## 2015-04-18 MED ORDER — LACTATED RINGERS IV SOLN
INTRAVENOUS | Status: DC
  Administered 2015-04-18: 12:00:00 via INTRAVENOUS

## 2015-04-18 MED ORDER — ROCURONIUM BROMIDE 100 MG/10ML IV SOLN
INTRAVENOUS | Status: DC | PRN
  Administered 2015-04-18: 10 mg via INTRAVENOUS
  Administered 2015-04-18: 40 mg via INTRAVENOUS

## 2015-04-18 MED ORDER — DEXAMETHASONE SODIUM PHOSPHATE 4 MG/ML IJ SOLN
INTRAMUSCULAR | Status: AC
  Filled 2015-04-18: qty 2

## 2015-04-18 MED ORDER — 0.9 % SODIUM CHLORIDE (POUR BTL) OPTIME
TOPICAL | Status: DC | PRN
  Administered 2015-04-18: 1000 mL

## 2015-04-18 MED ORDER — ROCURONIUM BROMIDE 50 MG/5ML IV SOLN
INTRAVENOUS | Status: AC
  Filled 2015-04-18: qty 1

## 2015-04-18 MED ORDER — LIDOCAINE HCL (CARDIAC) 20 MG/ML IV SOLN
INTRAVENOUS | Status: DC | PRN
  Administered 2015-04-18: 60 mg via INTRAVENOUS

## 2015-04-18 MED ORDER — OXYCODONE HCL 5 MG/5ML PO SOLN: 5.0000 mg | Freq: Once | ORAL | Status: DC | PRN

## 2015-04-18 MED ORDER — PROPOFOL 10 MG/ML IV BOLUS
INTRAVENOUS | Status: DC | PRN
  Administered 2015-04-18: 180 mg via INTRAVENOUS

## 2015-04-18 MED ORDER — ONDANSETRON HCL 4 MG/2ML IJ SOLN
INTRAMUSCULAR | Status: DC | PRN
  Administered 2015-04-18: 4 mg via INTRAVENOUS

## 2015-04-18 MED ORDER — EPINEPHRINE HCL 1 MG/ML IJ SOLN
INTRAMUSCULAR | Status: AC
  Filled 2015-04-18: qty 1

## 2015-04-18 MED ORDER — LIDOCAINE HCL 4 % MT SOLN
OROMUCOSAL | Status: DC | PRN
  Administered 2015-04-18: 4 mL via TOPICAL

## 2015-04-18 SURGICAL SUPPLY — 22 items
BRUSH CYTOL CELLEBRITY 1.5X140 (MISCELLANEOUS) IMPLANT
CANISTER SUCTION 2500CC (MISCELLANEOUS) ×2 IMPLANT
CONT SPEC 4OZ CLIKSEAL STRL BL (MISCELLANEOUS) ×8 IMPLANT
COVER TABLE BACK 60X90 (DRAPES) ×2 IMPLANT
FORCEPS BIOP RJ4 1.8 (CUTTING FORCEPS) ×2 IMPLANT
GAUZE SPONGE 4X4 12PLY STRL (GAUZE/BANDAGES/DRESSINGS) ×2 IMPLANT
GLOVE BIO SURGEON STRL SZ 6.5 (GLOVE) ×4 IMPLANT
GOWN STRL REUS W/ TWL LRG LVL3 (GOWN DISPOSABLE) ×2 IMPLANT
GOWN STRL REUS W/TWL LRG LVL3 (GOWN DISPOSABLE) ×2
KIT CLEAN ENDO COMPLIANCE (KITS) ×4 IMPLANT
KIT ROOM TURNOVER OR (KITS) ×2 IMPLANT
MARKER SKIN DUAL TIP RULER LAB (MISCELLANEOUS) ×2 IMPLANT
NEEDLE BLUNT 18X1 FOR OR ONLY (NEEDLE) IMPLANT
NEEDLE SONO TIP II EBUS (NEEDLE) ×2 IMPLANT
NS IRRIG 1000ML POUR BTL (IV SOLUTION) ×2 IMPLANT
OIL SILICONE PENTAX (PARTS (SERVICE/REPAIRS)) ×2 IMPLANT
PAD ARMBOARD 7.5X6 YLW CONV (MISCELLANEOUS) ×4 IMPLANT
SYR 20CC LL (SYRINGE) ×2 IMPLANT
SYR 20ML ECCENTRIC (SYRINGE) ×4 IMPLANT
TOWEL OR 17X24 6PK STRL BLUE (TOWEL DISPOSABLE) ×2 IMPLANT
TRAP SPECIMEN MUCOUS 40CC (MISCELLANEOUS) ×2 IMPLANT
TUBE CONNECTING 12X1/4 (SUCTIONS) ×4 IMPLANT

## 2015-04-18 NOTE — Telephone Encounter (Signed)
Oncology Nurse Navigator Documentation  Oncology Nurse Navigator Flowsheets 04/18/2015  Referral date to RadOnc/MedOnc 04/18/2015  Navigator Encounter Type Introductory phone call/Recieved referral from Dr. Servando Snare today.  I called to set up for thoracic clinic on 04/21/15 arrive at cancer center at 1:30.  I vm message regarding appt and if needed to follow up with my, my phon number.    Time Spent with Patient 15

## 2015-04-18 NOTE — Progress Notes (Signed)
Patient asking about sutures in his right chest that were to be removed today while he was at the hospital.  Dr. Servando Snare notified, just waiting on reply before sending patient home. Patient is stable

## 2015-04-18 NOTE — Brief Op Note (Signed)
      [address]       [place] [address]             [phone]      04/18/2015  1:34 PM  PATIENT:  Gabriel [name]  74 y.o. 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  PROCEDURE:  Procedure(s): VIDEO BRONCHOSCOPY WITH ENDOBRONCHIAL ULTRASOUND (N/A) And biopsy of right middle and right lower lobe bronchous , and transbronchial biopsy of the 10 r nodes   SURGEON:  Surgeon(s) and Role:    * Grace Isaac, MD - Primary    ANESTHESIA:   general  EBL:  Total I/O In: 1000 [I.V.:1000] Out: -   BLOOD ADMINISTERED:none  DRAINS: none   LOCAL MEDICATIONS USED:  NONE  SPECIMEN:  Source of Specimen:  right lowwr lobe bronchus, right middel lobe bronchus , 10 r node   DISPOSITION OF SPECIMEN:  PATHOLOGY  COUNTS:  YES DICTATION: .Dragon Dictation  PLAN OF CARE: Discharge to home after PACU  PATIENT DISPOSITION:  PACU - hemodynamically stable.   Delay start of Pharmacological VTE agent (>24hrs) due to surgical blood loss or risk of bleeding: yes

## 2015-04-18 NOTE — Anesthesia Preprocedure Evaluation (Signed)
Anesthesia Evaluation  Patient identified by MRN, date of birth, ID band Patient awake    Reviewed: Allergy & Precautions, NPO status , Patient's Chart, lab work & pertinent test results  Airway Mallampati: II  TM Distance: >3 FB Neck ROM: Full    Dental  (+) Teeth Intact, Dental Advisory Given   Pulmonary Current Smoker,  breath sounds clear to auscultation        Cardiovascular Rhythm:Regular Rate:Normal     Neuro/Psych    GI/Hepatic   Endo/Other    Renal/GU      Musculoskeletal   Abdominal   Peds  Hematology   Anesthesia Other Findings   Reproductive/Obstetrics                             Anesthesia Physical Anesthesia Plan  ASA: III  Anesthesia Plan: General   Post-op Pain Management:    Induction: Intravenous  Airway Management Planned: Oral ETT  Additional Equipment:   Intra-op Plan:   Post-operative Plan: Extubation in OR  Informed Consent: I have reviewed the patients History and Physical, chart, labs and discussed the procedure including the risks, benefits and alternatives for the proposed anesthesia with the patient or authorized representative who has indicated his/her understanding and acceptance.   Dental advisory given  Plan Discussed with: CRNA and Anesthesiologist  Anesthesia Plan Comments: (L. Lung mass Smoker  Plan GA with oral ETT  Gabriel [name])        Anesthesia Quick Evaluation

## 2015-04-18 NOTE — Anesthesia Postprocedure Evaluation (Signed)
  Anesthesia Post-op Note  Patient: Gabriel [name]  Procedure(s) Performed: Procedure(s): VIDEO BRONCHOSCOPY WITH ENDOBRONCHIAL ULTRASOUND (N/A)  Patient Location: PACU  Anesthesia Type:General  Level of Consciousness: awake, alert  and oriented  Airway and Oxygen Therapy: Patient Spontanous Breathing and Patient connected to nasal cannula oxygen  Post-op Pain: mild  Post-op Assessment: Post-op Vital signs reviewed, Patient's Cardiovascular Status Stable, Respiratory Function Stable, Patent Airway and Pain level controlled              Post-op Vital Signs: stable  Last Vitals:  Filed Vitals:   04/18/15 1500  BP: 136/67  Pulse: 76  Temp:   Resp:     Complications: No apparent anesthesia complications

## 2015-04-18 NOTE — Progress Notes (Signed)
[employer] nurse from Dr. Servando Snare office called back and stated to remove the one suture from his right side.  Suture removed skin intact and surrounding area clean dry and intact. Instructed patient to monitor for signs of infection redness tenderness and swelling. Patient tolerated well. And ready for discharge

## 2015-04-18 NOTE — Transfer of Care (Signed)
Immediate Anesthesia Transfer of Care Note  Patient: Gabriel [name]  Procedure(s) Performed: Procedure(s): VIDEO BRONCHOSCOPY WITH ENDOBRONCHIAL ULTRASOUND (N/A)  Patient Location: PACU  Anesthesia Type:General  Level of Consciousness: awake, alert , oriented and patient cooperative  Airway & Oxygen Therapy: Patient Spontanous Breathing and Patient connected to nasal cannula oxygen  Post-op Assessment: Report given to RN and Post -op Vital signs reviewed and stable  Post vital signs: Reviewed and stable  Last Vitals:  Filed Vitals:   04/18/15 1345  BP: 138/76  Pulse: 92  Temp: 36.9 C  Resp: 15    Complications: No apparent anesthesia complications

## 2015-04-18 NOTE — Discharge Instructions (Signed)
Flexible Bronchoscopy, Care After ° °Refer to this sheet in the next few weeks. These instructions provide you with information on caring for yourself after your procedure. Your health care provider may also give you more specific instructions. Your treatment has been planned according to current medical practices, but problems sometimes occur. Call your health care provider if you have any problems or questions after your procedure.  °WHAT TO EXPECT AFTER THE PROCEDURE °It is normal to have the following symptoms for 24-48 hours after the procedure:  °· Increased cough. °· Low-grade fever. °· Sore throat or hoarse voice. °· Small streaks of blood in your thick spit (sputum) if tissue samples were taken (biopsy). °HOME CARE INSTRUCTIONS  °· Do not eat or drink anything for 2 hours after your procedure. Your nose and throat were numbed by medicine. If you try to eat or drink before the medicine wears off, food or drink could go into your lungs or you could burn yourself. After the numbness is gone and your cough and gag reflexes have returned, you may eat soft food and drink liquids slowly.   °· The day after the procedure, you can go back to your normal diet.   °· You may resume normal activities.   °· Keep all follow-up visits as directed by your health care provider. It is important to keep all your appointments, especially if tissue samples were taken for testing (biopsy). °SEEK IMMEDIATE MEDICAL CARE IF:  °· You have increasing shortness of breath.   °· You become light-headed or faint.   °· You have chest pain.   °· You have any new concerning symptoms. °· You cough up more than a small amount of blood. °· The amount of blood you cough up increases. °MAKE SURE YOU: °· Understand these instructions. °· Will watch your condition. °· Will get help right away if you are not doing well or get worse. °Document Released: 05/04/2005 Document Revised: 03/01/2014 Document Reviewed: 06/19/2013 °[employer]® Patient  Information ©2015 [employer]. This information is not intended to replace advice given to you by your health care provider. Make sure you discuss any questions you have with your health care provider. ° °

## 2015-04-18 NOTE — H&P (Signed)
[address]       [place] [address]             [phone]    Pressley H Lueth   Chief Complaint: SOB right chest pain HPI: patient  Is 74 yo male long term smoker has some shortness of breath. Began this morning. He did have some slight right-sided chest pain . He felt that he pulled a muscle. He has had some shortness of breath since. No fevers.He's had a mild cough. He is in everyday smoker. He has a spontaneous pneumothorax.while in  [place] on the left . Brought back from triage chest x-ray showed hydropneumothorax on the right. On previous admission ct was placed and removed, A ct scan was done and as out patient a PET scan was done.    Past Medical History  Diagnosis Date  . Spontaneous pneumothorax     HISTORY OF (ONLY)  . Cancer     skin cancer  . Lung mass     right  . Inguinal hernia     Past Surgical History  Procedure Laterality Date  . Hernia repair  2011  . Colonoscopy w/ biopsies and polypectomy    . Wisdom tooth extraction    . Tonsillectomy      Family History  Problem Relation Age of Onset  . Heart attack Father   . Hypertension Mother   . Heart disease Father    Social History:  reports that he has been smoking Cigarettes.  He has a 27.5 pack-year smoking history. He has never used smokeless tobacco. He reports that he drinks alcohol. He reports that he does not use illicit drugs.  Allergies:  Allergies  Allergen Reactions  . Aleve [Naproxen] Rash    Medications Prior to Admission  Medication Sig Dispense Refill  . b complex vitamins tablet Take 1 tablet by mouth daily.    . calcium-vitamin D (OSCAL WITH D) 500-200 MG-UNIT per tablet Take 1 tablet by mouth daily.    . Cyanocobalamin (VITAMIN B 12 PO) Take 1 tablet by mouth daily.    . Ginkgo Biloba 40 MG TABS Take 40 mg by mouth daily.    Marland Kitchen L-Arginine 1000 MG TABS Take 1,000 mg by mouth daily.    . Multiple Vitamin (MULTI VITAMIN DAILY PO) Take 1 tablet by mouth  daily.    . Omega-3 Fatty Acids (FISH OIL) 1000 MG CAPS Take 1,000 mg by mouth daily.    Marland Kitchen oxyCODONE (OXY IR/ROXICODONE) 5 MG immediate release tablet Take 1 tablet (5 mg total) by mouth every 4 (four) hours as needed for moderate pain. 30 tablet 0  . vitamin A 10000 UNIT capsule Take 10,000 Units by mouth daily.      Results for orders placed or performed during the hospital encounter of 04/18/15 (from the past 48 hour(s))  APTT     Status: None   Collection Time: 04/18/15 11:06 AM  Result Value Ref Range   aPTT 28 24 - 37 seconds  CBC     Status: None   Collection Time: 04/18/15 11:06 AM  Result Value Ref Range   WBC 8.8 4.0 - 10.5 K/uL   RBC 5.12 4.22 - 5.81 MIL/uL   Hemoglobin 15.4 13.0 - 17.0 g/dL   HCT 44.5 39.0 - 52.0 %   MCV 86.9 78.0 - 100.0 fL   MCH 30.1 26.0 - 34.0 pg   MCHC 34.6 30.0 - 36.0 g/dL   RDW 13.6  11.5 - 15.5 %   Platelets 265 150 - 400 K/uL  Comprehensive metabolic panel     Status: Abnormal   Collection Time: 04/18/15 11:06 AM  Result Value Ref Range   Sodium 137 135 - 145 mmol/L   Potassium 3.9 3.5 - 5.1 mmol/L   Chloride 104 101 - 111 mmol/L   CO2 24 22 - 32 mmol/L   Glucose, Bld 117 (H) 65 - 99 mg/dL   BUN 17 6 - 20 mg/dL   Creatinine, Ser 0.89 0.61 - 1.24 mg/dL   Calcium 9.4 8.9 - 10.3 mg/dL   Total Protein 6.9 6.5 - 8.1 g/dL   Albumin 3.7 3.5 - 5.0 g/dL   AST 21 15 - 41 U/L   ALT 20 17 - 63 U/L   Alkaline Phosphatase 69 38 - 126 U/L   Total Bilirubin 0.7 0.3 - 1.2 mg/dL   GFR calc non Af Amer >60 >60 mL/min   GFR calc Af Amer >60 >60 mL/min    Comment: (NOTE) The eGFR has been calculated using the CKD EPI equation. This calculation has not been validated in all clinical situations. eGFR's persistently <60 mL/min signify possible Chronic Kidney Disease.    Anion gap 9 5 - 15  Protime-INR     Status: None   Collection Time: 04/18/15 11:06 AM  Result Value Ref Range   Prothrombin Time 14.1 11.6 - 15.2 seconds   INR 1.07 0.00 - 1.49   Dg  Chest 2 View  04/18/2015   CLINICAL DATA:  Preop bronchoscopy for right lung mass  EXAM: CHEST  2 VIEW  COMPARISON:  04/08/2015  FINDINGS: Improved right lower lobe inflation. An ill-defined right hilar mass is again noted, recently evaluated by PET-CT. Emphysema. There is no edema, consolidation, effusion, or pneumothorax. Remote left rib fractures.  IMPRESSION: 1. No active cardiopulmonary disease. 2. Right perihilar mass. Postobstructive changes in the right lower lobe are improved since 04/08/2015.   Electronically Signed   By: Monte Fantasia M.D.   On: 04/18/2015 11:00   .Ct Chest Wo Contrast  04/06/2015   CLINICAL DATA:  Spontaneous right pneumothorax.  Left lung nodule.  EXAM: CT CHEST WITHOUT CONTRAST  TECHNIQUE: Multidetector CT imaging of the chest was performed following the standard protocol without IV contrast.  COMPARISON:  None.  FINDINGS: Mediastinum/Lymph Nodes: No mediastinal lymphadenopathy identified. Evaluation limited by lack of IV contrast, however right hilar soft tissue fullness/ lymphadenopathy is seen which is contiguous with a mass in the central right lower lobe described below.  Lungs/Pleura: Right chest tube is seen in place and there is a tiny residual less than 5% right anterolateral pneumothorax. Moderate to severe emphysema is seen with subpleural blebs seen in both lung apices, right side greater than left. No evidence pleural effusion.  A spiculated masslike opacity is seen in the central right lower lobe which is contiguous with the right hilum. This measures approximately 4.3 x 4.7 cm on image 33 of series 3. This is highly suspicious for primary bronchogenic carcinoma. Atelectasis seen in the posterior right lower lobe.  A ground-glass nodule is seen in the anterior left lower lobe on image 48 measuring 1.3 x 1.6 cm. A lesion with peripheral ground-glass opacity and central bullous changes is seen in the superior segment left lower lobe measuring 2.6 x 2.8 cm on image 30.  In the left upper lobe, there are 2 irregular subpleural nodular densities measuring 9 mm on image 8 and 17 of series 3.  Musculoskeletal/Soft Tissues: No suspicious bone lesions or other significant chest wall abnormality.  Upper Abdomen: Incidental note is made of a 1.9 cm low-attenuation right adrenal mass with attenuation value of -11 Hounsfield units, consistent with benign adrenal adenoma.  IMPRESSION: Tiny less than 5% right pneumothorax with right chest tube in place. 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  4.7 cm spiculated masslike opacity in central right lower lobe, with involvement of the right hilum/ right hilar lymphadenopathy. This is highly suspicious for primary bronchogenic carcinoma. No mediastinal lymphadenopathy identified. Consider bronchoscopy and PET-CT for further evaluation.  Indeterminate ground-glass and tiny solid pulmonary nodules in the left upper and lower lobes, as described above. These can also be assessed by PET-CT or short-term follow-up CT in 3 months.  Incidentally noted benign right adrenal adenoma.   Electronically Signed   By: Earle Gell M.D.   On: 04/06/2015 08:12   Nm Pet Image Initial (pi) Skull Base To Thigh  04/12/2015   ADDENDUM REPORT: 04/12/2015 15:32  ADDENDUM: Multiple lesions in the left lung were not described in the original report. There are as follows:  --12 x 11 mm subpleural nodular opacity in the lateral left lung apex (series 3/image 66), max SUV 1.2  --12 x 9 mm irregular nodular opacity in the medial left upper lobe (series 3/image 72), max SUV 1.4  --14 x 18 mm cavitary lesion in the posterior left upper lobe with 10 mm solid component (series 3/image 78), max SUV 1.4  --21 x 23 mm cavitary lesion in the superior left lower lobe with 9 mm solid component (series 3/image 94), max SUV 1.7  --14 x 10 mm partially cavitary nodule at the left lung base, max SUV 0.7  Of these lesions, the  2.3 cm cavitary lesion in the superior left lower lobe is considered most suspicious for malignancy, possibly synchronous primary bronchogenic neoplasm. However, given the presence of multiple cavitary lesions, infection is also possible. Contralateral cavitary pulmonary metastases would be unusual in the absence of a necrotic/cavitary primary or nodal disease, and is therefore considered less likely.  Consider short-term follow-up in 4-6 weeks after appropriate antimicrobial therapy to assess for resolution.  ADDENDED IMPRESSION:  4.0 x 3.0 cm hypermetabolic right perihilar mass-like opacity, suspicious for primary bronchogenic neoplasm, as described above. Consider bronchoscopy for further evaluation.  Multiple nodular/cavitary opacities in the left lung, including a dominant 2.3 cm cavitary lesion in the superior left lower lobe which is considered is suspicious for possible synchronous primary bronchogenic neoplasm. Given the presence of multiple lesions, consider short-term follow-up in 4-6 weeks after appropriate antimicrobial therapy to assess for resolution.  These results were called by telephone at the time of interpretation on 04/12/2015 at 3:20 pm to Dr. Lanelle Bal, who verbally acknowledged these results.   Electronically Signed   By: Julian Hy M.D.   On: 04/12/2015 15:32   04/12/2015   CLINICAL DATA:  Initial treatment strategy for right lung mass.  EXAM: NUCLEAR MEDICINE PET SKULL BASE TO THIGH  TECHNIQUE: 12.61 mCi F-18 FDG was injected intravenously. Full-ring PET imaging was performed from the skull base to thigh after the radiotracer. CT data was obtained and used for attenuation correction and anatomic localization.  FASTING BLOOD GLUCOSE:  Value: 98 mg/dl  COMPARISON:  CT chest dated 04/05/2015  FINDINGS: NECK  No hypermetabolic lymph nodes in the neck.  9 mm right thyroid nodule (series 3/ image 61), max SUV 3.7.  CHEST  4.0 x 3.0 cm right perihilar mass-like opacity (series 3/  image 104), max SUV 8.0, suspicious for primary bronchogenic neoplasm. The lesion abuts the major fissure and straddles the posterior right upper and superior right lower lobes.  Notably, this does not reflect a true endobronchial lesion, although this does compress the right lower and middle lobe airway, with associated debris within the right lower lobe bronchus and associated postobstructive opacity the in the posterior right upper and middle lobes.  No associated hypermetabolic thoracic lymphadenopathy.  The heart is normal in size. No pericardial effusion. Coronary atherosclerosis. Atherosclerotic calcifications of the aortic arch.  ABDOMEN/PELVIS  No abnormal hypermetabolic activity within the liver, pancreas, adrenal glands, or spleen.  No hypermetabolic lymph nodes in the abdomen or pelvis.  Atherosclerotic calcifications of the abdominal aorta and branch vessels.  Prostatomegaly, with enlargement of the central gland which indents the base of the bladder. Bladder is mildly thick-walled although underdistended.  SKELETON  No focal hypermetabolic activity to suggest skeletal metastasis.  IMPRESSION: 4.0 x 3.0 cm hyper mid right perihilar mass-like opacity, suspicious for primary bronchogenic neoplasm. The lesion abuts the major fissure and straddles the posterior right upper and superior right lower lobes.  No evidence of metastatic disease.  9 mm hypermetabolic right thyroid nodule, indeterminate.  Electronically Signed: By: Julian Hy M.D. On: 04/12/2015 14:48  .  Review of Systems  Constitutional: Negative.   HENT: Positive for congestion.   Eyes: Negative.   Respiratory: Positive for cough and shortness of breath. Negative for hemoptysis and sputum production.   Cardiovascular: Negative.   Gastrointestinal: Negative.   Genitourinary: Negative.   Musculoskeletal: Negative.   Skin: Negative.   Neurological: Negative.   Endo/Heme/Allergies: Negative.   Psychiatric/Behavioral: Negative.      Blood pressure 173/73, pulse 89, temperature 97.6 F (36.4 C), temperature source Oral, resp. rate 20, height _0  (1.778 m), weight 138 lb (62.596 kg), SpO2 100 %. Physical Exam  Constitutional: He is oriented to person, place, and time. He appears well-developed. He is not intubated.  HENT:  Mouth/Throat: No oropharyngeal exudate.  Neck: No JVD present. No tracheal deviation present. No thyromegaly present.  Cardiovascular: Normal rate, regular rhythm, normal heart sounds and intact distal pulses.   No murmur heard. Respiratory: breath sounds bilaterial GI: Soft. Bowel sounds are normal.  Musculoskeletal: Normal range of motion.  Neurological: He is alert and oriented to person, place, and time. No cranial nerve deficit.  Skin: Skin is warm.  Psychiatric: He has a normal mood and affect. His behavior is normal. Judgment and thought content normal.  left reduciable inguinal hernia  Assessment/Plan  Right hilar lung mass with addition smaller left lung lesions. Plan Bronchoscopy and EBUS poss biopsy. The goals risks and alternatives of the planned surgical procedure Bronchoscopy and EBUS poss biopsy  have been discussed with the patient in detail. The risks of the procedure including death, infection, stroke, myocardial infarction, bleeding, blood transfusion have all been discussed specifically.  I have quoted [name] a 2 % of perioperative mortality and a complication rate as high as 20 %. The patient's questions have been answered.[name] is willing  to proceed with the planned procedure.   Grace Isaac 04/18/2015, 11:48 AM

## 2015-04-19 ENCOUNTER — Other Ambulatory Visit: Payer: 59

## 2015-04-19 ENCOUNTER — Encounter (HOSPITAL_COMMUNITY): Payer: Self-pay | Admitting: Cardiothoracic Surgery

## 2015-04-21 ENCOUNTER — Encounter: Payer: Self-pay | Admitting: *Deleted

## 2015-04-21 ENCOUNTER — Ambulatory Visit: Payer: 59 | Attending: Internal Medicine | Admitting: Physical Therapy

## 2015-04-21 ENCOUNTER — Ambulatory Visit (HOSPITAL_BASED_OUTPATIENT_CLINIC_OR_DEPARTMENT_OTHER): Payer: 59 | Admitting: Internal Medicine

## 2015-04-21 ENCOUNTER — Telehealth: Payer: Self-pay | Admitting: Internal Medicine

## 2015-04-21 ENCOUNTER — Ambulatory Visit
Admission: RE | Admit: 2015-04-21 | Discharge: 2015-04-21 | Disposition: A | Discharge status: Home / Self Care | Payer: 59 | Source: Ambulatory Visit | Attending: Radiation Oncology | Admitting: Radiation Oncology

## 2015-04-21 ENCOUNTER — Encounter: Payer: Self-pay | Admitting: Internal Medicine

## 2015-04-21 VITALS — BP 162/86 | HR 93 | Temp 98.6°F | Resp 19 | Ht 70.0 in | Wt 138.8 lb

## 2015-04-21 DIAGNOSIS — C342 Malignant neoplasm of middle lobe, bronchus or lung: Secondary | ICD-10-CM | POA: Diagnosis not present

## 2015-04-21 DIAGNOSIS — C3491 Malignant neoplasm of unspecified part of right bronchus or lung: Secondary | ICD-10-CM

## 2015-04-21 DIAGNOSIS — R599 Enlarged lymph nodes, unspecified: Secondary | ICD-10-CM | POA: Diagnosis not present

## 2015-04-21 DIAGNOSIS — R911 Solitary pulmonary nodule: Secondary | ICD-10-CM

## 2015-04-21 DIAGNOSIS — Z720 Tobacco use: Secondary | ICD-10-CM

## 2015-04-21 DIAGNOSIS — C3431 Malignant neoplasm of lower lobe, right bronchus or lung: Secondary | ICD-10-CM

## 2015-04-21 DIAGNOSIS — Z8739 Personal history of other diseases of the musculoskeletal system and connective tissue: Secondary | ICD-10-CM

## 2015-04-21 DIAGNOSIS — M549 Dorsalgia, unspecified: Secondary | ICD-10-CM | POA: Diagnosis not present

## 2015-04-21 DIAGNOSIS — R293 Abnormal posture: Secondary | ICD-10-CM | POA: Diagnosis present

## 2015-04-21 MED ORDER — PROCHLORPERAZINE MALEATE 10 MG PO TABS: 10.0000 mg | ORAL_TABLET | Freq: Four times a day (QID) | ORAL | Status: DC | PRN

## 2015-04-21 NOTE — Progress Notes (Signed)
[place] Clinical Social Work  Clinical Social Work met with patient/family and Futures trader at [facility] appointment to offer support and assess for psychosocial needs.  Medical oncologist reviewed patient's diagnosis and recommended treatment plan with patient/family.  Gabriel [name] was accompanied by [name] and daughter Gabriel [name].  Gabriel [name] works as Holiday representative for [employer]  Gabriel [name] shared his main concern was experiencing side effects, patient's son shared his concern is father not feeling well and dealing with the uncertainty, and his daughter shared her hope is more information.  CSW and patient/family discussed ways of coping including family support and information seeking.    Clinical Social Work briefly discussed Clinical Social Work role and [employer] support programs/services.  Clinical Social Work encouraged patient to call with any additional questions or concerns.   [name], MSW, LCSW, OSW-C Clinical Social Worker [facility] [phone]

## 2015-04-21 NOTE — Therapy (Signed)
[place], [place], [address] Phone: [phone]   Fax:  [phone]  Physical Therapy Evaluation  Patient Details  Name: Gabriel [name] MRN: [id number] Date of Birth: 11/25/1940 Referring Provider:  Curt Bears, MD  Encounter Date: 04/21/2015      PT End of Session - 04/21/15 1703    Visit Number 1   Number of Visits 1   PT Start Time 1530   PT Stop Time 1545   PT Time Calculation (min) 15 min   Activity Tolerance Patient tolerated treatment well   Behavior During Therapy [facility] for tasks assessed/performed      Past Medical History  Diagnosis Date  . Spontaneous pneumothorax     HISTORY OF (ONLY)  . Cancer     skin cancer  . Lung mass     right  . Inguinal hernia     Past Surgical History  Procedure Laterality Date  . Hernia repair  2011  . Colonoscopy w/ biopsies and polypectomy    . Wisdom tooth extraction    . Tonsillectomy    . Video bronchoscopy with endobronchial ultrasound N/A 04/18/2015    Procedure: VIDEO BRONCHOSCOPY WITH ENDOBRONCHIAL ULTRASOUND;  Surgeon: Grace Isaac, MD;  [facility];  Service: Thoracic;  Laterality: N/A;    There were no vitals filed for this visit.  Visit Diagnosis:  Poor posture - Plan: PT plan of care cert/re-cert  History of back pain - Plan: PT plan of care cert/re-cert      Subjective Assessment - 04/21/15 1654    Subjective Patient presented to Ed with shortness of breath and right chest pain 04/06/15.   Patient is accompained by: Family member  son and daughter   Pertinent History Presented to ED with SOB and right chest pain.  Workup indicated hydropneumothorax; drained with chest tube.  Studies showed right lung mass and about 5 left lung lesions; mass on right diagnosed as adenocarcinoma.  Pt. anticipates having chemoradiation to right lung and left lung lesions will be followed.  Current smoker.  Hernia repair 2011.  h/o back  problems helped with current home exercise program; also does shoulder exercises for problems there.                                                                       Currently in Pain? 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  No   Is the patient reluctant to leave their home because of a fear of falling?  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   Leisure takes a break to walk around the block a couple of times at work; also does crunches, back exercises, and shoulder exercises with [employer]   Observation/Other Assessments   Observations slim older male in no distress   Functional Tests   Functional tests Sit to Stand   Sit to Stand   Comments 17 times in 30 seconds (good)   Posture/Postural Control   Posture/Postural Control Postural limitations   Postural Limitations Rounded Shoulders;Forward head   ROM / Strength   AROM / PROM / Strength AROM   AROM   Overall AROM Comments trunk AROM in standing WFL throughout   Ambulation/Gait   Ambulation/Gait Yes   Ambulation/Gait Assistance 7: Independent   Balance   Balance Assessed Yes   Dynamic Standing Balance   Dynamic Standing - Comments reaches forward 14 inches in standing                           PT Education  - 2015/04/27 1703    Education provided Yes   Education Details posture, breathing, energy conservation, walking, PT information   Person(s) Educated Patient;Child(ren)   Methods Explanation;Handout   Comprehension Verbalized understanding               Lung Clinic Goals - 04-27-2015 1706    Patient will be able to verbalize understanding of the benefit of exercise to decrease fatigue.   Status Achieved   Patient will be able to verbalize the importance of posture.   Status Achieved   Patient will be able to demonstrate diaphragmatic breathing for improved lung function.   Status Achieved   Patient will be able to verbalize understanding of the role of physical therapy to prevent functional decline and who to contact if physical therapy is needed.   Status Achieved             Plan - 27-Apr-2015 1704    Clinical Impression Statement Patient with newly diagnosed with adenocarcinoma of right lung and undiagnosed lesions in left lung, expected to be treated with chemoradiation;  He does have some postural impairments and h/o back pain and shoulder pain.   Rehab Potential Good   PT Frequency One time visit   PT Treatment/Interventions Patient/family education   PT Next Visit Plan None at this time.   Consulted and Agree with Plan of Care Patient          G-Codes - Apr 27, 2015 1707    Functional Assessment Tool Used clinical judgement   Functional Limitation Changing and maintaining body position   Changing and Maintaining Body Position Current Status [phone]) At least 1 percent but less than 20 percent impaired, limited or restricted   Changing and Maintaining Body Position Goal Status (K9326) At least 1 percent but less than 20 percent impaired, limited or restricted   Changing and Maintaining Body Position Discharge Status (Z1245) At least 1 percent but less than 20 percent impaired, limited or restricted       Problem List Patient Active Problem List   Diagnosis Date  Noted  . Non-small cell carcinoma of right lung, stage 3 04/27/2015  . Lung mass 04/18/2015  . Pneumothorax 04/04/2015  . Pneumothorax on right 04/04/2015  . Hemoptysis, unspecified 10/07/2013    Jearlene Bridwell 27-Apr-2015, 5:09 PM  [facility]  [place], [place], [address] Phone: [phone]   Fax:  [phone]    Patient will follow up at outpatient cancer rehab if needed.  If the patient does require further physical therapy, a specific plan will be dictated and sent to the referring physician for approval at that time. The patient was educated today on the importance of posture, diaphragmatic breathing, energy conservation, and a progressive walking program.    Serafina Royals, PT 04/21/2015 5:09 PM

## 2015-04-21 NOTE — Progress Notes (Signed)
[place] [phone]   [phone] Multidisciplinary thoracic oncology clinic  CONSULT NOTE  REFERRING PHYSICIAN: Dr. Lanelle Bal  REASON FOR CONSULTATION:  74 years old white male recently diagnosed with lung cancer.  HPI Gabriel [name] is a 74 y.o. male was past medical history significant for right pneumothorax, hemoptysis, irregular heart rate, skin cancer as well as long history of heavy smoking. The patient mentions that in early June 2016 he was complaining of right sided chest pain as well as cough and shortness of breath. He was seen at the emergency department at [facility] and chest x-ray performed on 04/04/2015 showed moderate to large right hydropneumothorax. The patient was seen by Dr. Madolyn Frieze and a right chest tube was placed. CT scan of the chest without contrast on 04/05/2015 showed less than 5% right pneumothorax with a right chest tube in place. There was a 4.7 cm spiculated masslike opacity in the central right lower lobe with involvement of the right hilum/right hilar lymphadenopathy. This is highly suspicious for primary bronchogenic carcinoma. There was no mediastinal lymphadenopathy identified. There was also indeterminate groundglass and tiny solid pulmonary nodules in the left upper and lower lobes. A PET scan was performed on 04/12/2015 and it showed 4.0 x 3.0 cm hypermetabolic right perihilar mass like opacity suspicious for primary bronchogenic neoplasm. There was multinodular and typically opacity in the left lung including a dominant 2.3 cm cavitary lesion in the superior left lower lobe suspicious for possible synchronous primary bronchogenic neoplasm. There was also a 1.2 x 1.1 cm subpleural nodular opacity in the lateral left lung apex, 1.2 x 0.9 cm irregular nodular opacity in the medial left upper lobe, 1.4 x 1.8 cm cavitary lesion in the posterior left upper lobe with a 1.0 cm solid component. There was also a 1.4 x  1.0 cm partially cavitary nodule in the left lung base. This nodules have no hypermetabolic activity. On 04/18/2015 the patient underwent bronchoscopy with biopsy of the right lower and right middle lobe bronchus and EBUS with transbronchial biopsy of 10 R lymph nodes. Dr. Servando Snare kindly referred the patient to me today for evaluation and recommendation regarding treatment of his condition. When seen today he denied having any specific complaints except for dry cough as well as shortness of breath with exertion and anxiety. He denied having any significant chest pain or hemoptysis. He lost around 8 pounds in the last 8 months. He has no headache or visual changes. Family history significant for a father with heart disease and mother with hypertension. The patient is divorced and he has 2 children [name] and daughter Gabriel [name]. 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 He has a history of smoking up to 2 packs per day for around 60 years and unfortunately he continues to smoke. He also drinks alcohol occasionally and no history of drug abuse.  HPI  Past Medical History  Diagnosis Date  . Spontaneous pneumothorax     HISTORY OF (ONLY)  . Cancer     skin cancer  . Lung mass     right  . Inguinal hernia     Past Surgical History  Procedure Laterality Date  . Hernia repair  2011  . Colonoscopy w/ biopsies and polypectomy    . Wisdom tooth extraction    . Tonsillectomy    . Video bronchoscopy with endobronchial ultrasound N/A 04/18/2015    Procedure: VIDEO BRONCHOSCOPY WITH ENDOBRONCHIAL ULTRASOUND;  Surgeon: Percell Miller  Maryruth Bun, MD;  Location: [facility];  Service: Thoracic;  Laterality: N/A;    Family History  Problem Relation Age of Onset  . Heart attack Father   . Hypertension Mother   . Heart disease Father     Social History History  Substance Use Topics  . Smoking status: Current Every Day Smoker -- 0.50 packs/day for 55 years    Types: Cigarettes  . Smokeless  tobacco: Never Used  . Alcohol Use: Yes     Comment: every other night -social    Allergies  Allergen Reactions  . Aleve [Naproxen] Rash    Current Outpatient Prescriptions  Medication Sig Dispense Refill  . Ascorbic Acid (VITAMIN C) 1000 MG tablet Take 1,000 mg by mouth daily.    Marland Kitchen b complex vitamins tablet Take 1 tablet by mouth daily.    . calcium-vitamin D (OSCAL WITH D) 500-200 MG-UNIT per tablet Take 1 tablet by mouth daily.    . Cyanocobalamin (VITAMIN B 12 PO) Take 1 tablet by mouth daily.    . Ginkgo Biloba 40 MG TABS Take 40 mg by mouth daily.    Marland Kitchen GINSENG PO Take 1 each by mouth.    Marland Kitchen L-Arginine 1000 MG TABS Take 1,000 mg by mouth daily.    . magnesium 30 MG tablet Take 30 mg by mouth daily.    . Multiple Vitamin (MULTI VITAMIN DAILY PO) Take 1 tablet by mouth daily.    . Omega-3 Fatty Acids (FISH OIL) 1000 MG CAPS Take 1,000 mg by mouth daily.    Marland Kitchen oxyCODONE (OXY IR/ROXICODONE) 5 MG immediate release tablet Take 1 tablet (5 mg total) by mouth every 4 (four) hours as needed for moderate pain. 30 tablet 0  . potassium chloride (K-DUR) 10 MEQ tablet Take 10 mEq by mouth daily.    . vitamin A 10000 UNIT capsule Take 10,000 Units by mouth daily.     No current facility-administered medications for this visit.    Review of Systems  Constitutional: positive for fatigue and weight loss Eyes: negative Ears, nose, mouth, throat, and face: negative Respiratory: positive for cough and dyspnea on exertion Cardiovascular: negative Gastrointestinal: negative Genitourinary:negative Integument/breast: negative Hematologic/lymphatic: negative Musculoskeletal:negative Neurological: negative Behavioral/Psych: positive for anxiety Endocrine: negative Allergic/Immunologic: negative  Physical Exam  EUM:PNTIR, healthy, no distress, well nourished, well developed and anxious SKIN: skin color, texture, turgor are normal, no rashes or significant lesions HEAD: Normocephalic, No  masses, lesions, tenderness or abnormalities EYES: normal, PERRLA, Conjunctiva are pink and non-injected EARS: External ears normal, Canals clear OROPHARYNX:no exudate, no erythema and lips, buccal mucosa, and tongue normal  NECK: supple, no adenopathy, no JVD LYMPH:  no palpable lymphadenopathy, no hepatosplenomegaly LUNGS: clear to auscultation , and palpation HEART: regular rate & rhythm, no murmurs and no gallops ABDOMEN:abdomen soft, non-tender, normal bowel sounds and no masses or organomegaly BACK: Back symmetric, no curvature., No CVA tenderness EXTREMITIES:no joint deformities, effusion, or inflammation, no edema, no skin discoloration  NEURO: alert & oriented x 3 with fluent speech, no focal motor/sensory deficits  PERFORMANCE STATUS: ECOG 1  LABORATORY DATA: Lab Results  Component Value Date   WBC 8.8 04/18/2015   HGB 15.4 04/18/2015   HCT 44.5 04/18/2015   MCV 86.9 04/18/2015   PLT 265 04/18/2015      Chemistry      Component Value Date/Time   NA 137 04/18/2015 1106   K 3.9 04/18/2015 1106   CL 104 04/18/2015 1106   CO2 24 04/18/2015 1106  BUN 17 04/18/2015 1106   CREATININE 0.89 04/18/2015 1106      Component Value Date/Time   CALCIUM 9.4 04/18/2015 1106   ALKPHOS 69 04/18/2015 1106   AST 21 04/18/2015 1106   ALT 20 04/18/2015 1106   BILITOT 0.7 04/18/2015 1106       RADIOGRAPHIC STUDIES: Dg Chest 2 View  04/18/2015   CLINICAL DATA:  Preop bronchoscopy for right lung mass  EXAM: CHEST  2 VIEW  COMPARISON:  04/08/2015  FINDINGS: Improved right lower lobe inflation. An ill-defined right hilar mass is again noted, recently evaluated by PET-CT. Emphysema. There is no edema, consolidation, effusion, or pneumothorax. Remote left rib fractures.  IMPRESSION: 1. No active cardiopulmonary disease. 2. Right perihilar mass. Postobstructive changes in the right lower lobe are improved since 04/08/2015.   Electronically Signed   By: Monte Fantasia M.D.   On: 04/18/2015  11:00   Dg Chest 2 View  04/08/2015   CLINICAL DATA:  Right pneumothorax  EXAM: CHEST  2 VIEW  COMPARISON:  04/07/2015  FINDINGS: The right chest tube has been removed. There is no significant pneumothorax. There are apical blebs, right greater than left. There is mild chronic appearing interstitial coarsening. There is improvement, with partial clearance of right base airspace opacities.  IMPRESSION: Right chest tube removal, with no significant pneumothorax. Partial clearance of right base opacities.   Electronically Signed   By: Andreas Newport M.D.   On: 04/08/2015 06:22   Dg Chest 2 View  04/04/2015   CLINICAL DATA:  Productive cough for several months, acute shortness of Breath  EXAM: CHEST - 2 VIEW  COMPARISON:  10/07/2013  FINDINGS: Cardiac shadow is stable. A moderate to large right hydro pneumothorax is noted. The left lung is clear. Multiple radiopaque leads are noted over the chest wall. No acute bony abnormality is seen. Old healed rib fractures on the left are noted.  IMPRESSION: Moderate to large right hydro pneumothorax.  These results were called by telephone at the time of interpretation on 04/04/2015 at 7:49 pm to Dr. Alvino Chapel, who verbally acknowledged these results.   Electronically Signed   By: Inez Catalina M.D.   On: 04/04/2015 19:49   Ct Chest Wo Contrast  04/06/2015   CLINICAL DATA:  Spontaneous right pneumothorax.  Left lung nodule.  EXAM: CT CHEST WITHOUT CONTRAST  TECHNIQUE: Multidetector CT imaging of the chest was performed following the standard protocol without IV contrast.  COMPARISON:  None.  FINDINGS: Mediastinum/Lymph Nodes: No mediastinal lymphadenopathy identified. Evaluation limited by lack of IV contrast, however right hilar soft tissue fullness/ lymphadenopathy is seen which is contiguous with a mass in the central right lower lobe described below.  Lungs/Pleura: Right chest tube is seen in place and there is a tiny residual less than 5% right anterolateral  pneumothorax. Moderate to severe emphysema is seen with subpleural blebs seen in both lung apices, right side greater than left. No evidence pleural effusion.  A spiculated masslike opacity is seen in the central right lower lobe which is contiguous with the right hilum. This measures approximately 4.3 x 4.7 cm on image 33 of series 3. This is highly suspicious for primary bronchogenic carcinoma. Atelectasis seen in the posterior right lower lobe.  A ground-glass nodule is seen in the anterior left lower lobe on image 48 measuring 1.3 x 1.6 cm. A lesion with peripheral ground-glass opacity and central bullous changes is seen in the superior segment left lower lobe measuring 2.6 x 2.8 cm on  image 30. In the left upper lobe, there are 2 irregular subpleural nodular densities measuring 9 mm on image 8 and 17 of series 3.  Musculoskeletal/Soft Tissues: No suspicious bone lesions or other significant chest wall abnormality.  Upper Abdomen: Incidental note is made of a 1.9 cm low-attenuation right adrenal mass with attenuation value of -11 Hounsfield units, consistent with benign adrenal adenoma.  IMPRESSION: Tiny less than 5% right pneumothorax with right chest tube in place. 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  4.7 cm spiculated masslike opacity in central right lower lobe, with involvement of the right hilum/ right hilar lymphadenopathy. This is highly suspicious for primary bronchogenic carcinoma. No mediastinal lymphadenopathy identified. Consider bronchoscopy and PET-CT for further evaluation.  Indeterminate ground-glass and tiny solid pulmonary nodules in the left upper and lower lobes, as described above. These can also be assessed by PET-CT or short-term follow-up CT in 3 months.  Incidentally noted benign right adrenal adenoma.   Electronically Signed   By: Earle Gell M.D.   On: 04/06/2015 08:12   Nm Pet Image Initial (pi) Skull Base To  Thigh  04/12/2015   ADDENDUM REPORT: 04/12/2015 15:32  ADDENDUM: Multiple lesions in the left lung were not described in the original report. There are as follows:  --12 x 11 mm subpleural nodular opacity in the lateral left lung apex (series 3/image 66), max SUV 1.2  --12 x 9 mm irregular nodular opacity in the medial left upper lobe (series 3/image 72), max SUV 1.4  --14 x 18 mm cavitary lesion in the posterior left upper lobe with 10 mm solid component (series 3/image 78), max SUV 1.4  --21 x 23 mm cavitary lesion in the superior left lower lobe with 9 mm solid component (series 3/image 94), max SUV 1.7  --14 x 10 mm partially cavitary nodule at the left lung base, max SUV 0.7  Of these lesions, the 2.3 cm cavitary lesion in the superior left lower lobe is considered most suspicious for malignancy, possibly synchronous primary bronchogenic neoplasm. However, given the presence of multiple cavitary lesions, infection is also possible. Contralateral cavitary pulmonary metastases would be unusual in the absence of a necrotic/cavitary primary or nodal disease, and is therefore considered less likely.  Consider short-term follow-up in 4-6 weeks after appropriate antimicrobial therapy to assess for resolution.  ADDENDED IMPRESSION:  4.0 x 3.0 cm hypermetabolic right perihilar mass-like opacity, suspicious for primary bronchogenic neoplasm, as described above. Consider bronchoscopy for further evaluation.  Multiple nodular/cavitary opacities in the left lung, including a dominant 2.3 cm cavitary lesion in the superior left lower lobe which is considered is suspicious for possible synchronous primary bronchogenic neoplasm. Given the presence of multiple lesions, consider short-term follow-up in 4-6 weeks after appropriate antimicrobial therapy to assess for resolution.  These results were called by telephone at the time of interpretation on 04/12/2015 at 3:20 pm to Dr. Lanelle Bal, who verbally acknowledged these  results.   Electronically Signed   By: Julian Hy M.D.   On: 04/12/2015 15:32   04/12/2015   CLINICAL DATA:  Initial treatment strategy for right lung mass.  EXAM: NUCLEAR MEDICINE PET SKULL BASE TO THIGH  TECHNIQUE: 12.61 mCi F-18 FDG was injected intravenously. Full-ring PET imaging was performed from the skull base to thigh after the radiotracer. CT data was obtained and used for attenuation correction and anatomic localization.  FASTING BLOOD GLUCOSE:  Value: 98 mg/dl  COMPARISON:  CT  chest dated 04/05/2015  FINDINGS: NECK  No hypermetabolic lymph nodes in the neck.  9 mm right thyroid nodule (series 3/ image 61), max SUV 3.7.  CHEST  4.0 x 3.0 cm right perihilar mass-like opacity (series 3/ image 104), max SUV 8.0, suspicious for primary bronchogenic neoplasm. The lesion abuts the major fissure and straddles the posterior right upper and superior right lower lobes.  Notably, this does not reflect a true endobronchial lesion, although this does compress the right lower and middle lobe airway, with associated debris within the right lower lobe bronchus and associated postobstructive opacity the in the posterior right upper and middle lobes.  No associated hypermetabolic thoracic lymphadenopathy.  The heart is normal in size. No pericardial effusion. Coronary atherosclerosis. Atherosclerotic calcifications of the aortic arch.  ABDOMEN/PELVIS  No abnormal hypermetabolic activity within the liver, pancreas, adrenal glands, or spleen.  No hypermetabolic lymph nodes in the abdomen or pelvis.  Atherosclerotic calcifications of the abdominal aorta and branch vessels.  Prostatomegaly, with enlargement of the central gland which indents the base of the bladder. Bladder is mildly thick-walled although underdistended.  SKELETON  No focal hypermetabolic activity to suggest skeletal metastasis.  IMPRESSION: 4.0 x 3.0 cm hyper mid right perihilar mass-like opacity, suspicious for primary bronchogenic neoplasm. The  lesion abuts the major fissure and straddles the posterior right upper and superior right lower lobes.  No evidence of metastatic disease.  9 mm hypermetabolic right thyroid nodule, indeterminate.  Electronically Signed: By: Julian Hy M.D. On: 04/12/2015 14:48   Dg Chest Port 1 View  04/18/2015   CLINICAL DATA:  Postop.  Lung mass.  Inguinal hernia.  EXAM: PORTABLE CHEST - 1 VIEW  COMPARISON:  CT of 04/05/2015. Most recent plain film of earlier today.  FINDINGS: 1413 hours. Remote left rib fractures. Midline trachea. Mild cardiomegaly. No pleural effusion or pneumothorax. Pulmonary interstitial thickening is increased. Right infrahilar volume loss again identified.  IMPRESSION: No pneumothorax.  Persistent right infrahilar volume loss. Right perihilar mass is less well delineated today.  Pulmonary interstitial prominence is slightly increased. Possibly related to COPD/chronic bronchitis. Cannot exclude mild pulmonary venous congestion.   Electronically Signed   By: Abigail Miyamoto M.D.   On: 04/18/2015 14:34   Dg Chest Port 1 View  04/07/2015   CLINICAL DATA:  Chest soreness  EXAM: PORTABLE CHEST - 1 VIEW  COMPARISON:  04/06/2015  FINDINGS: Right chest tube is in place. No pneumothorax identified. Atelectasis is noted in the right lung base. Heart size is normal. No pleural effusion or edema.  IMPRESSION: 1. Stable right chest tube without pneumothorax. 2. Right base atelectasis.   Electronically Signed   By: Kerby Moors M.D.   On: 04/07/2015 08:25   Dg Chest Port 1 View  04/06/2015   CLINICAL DATA:  Right-sided pneumothorax, chest tube treatment  EXAM: PORTABLE CHEST - 1 VIEW  COMPARISON:  Portable chest x-ray and chest CT scan dated April 05, 2015  FINDINGS: The left lung is well-expanded. There are coarse interstitial markings inferiorly which are stable. On the right there is mild volume loss. There is basilar atelectasis. The apical pneumothorax is no longer evident. There are bullous changes in  the right pulmonary apex. The right chest tube tip projects over the medial aspect of the eighth rib. The mediastinum is normal in width. The cardiac silhouette remains enlarged. The pulmonary vascularity is normal. There are old rib deformities on the left.  IMPRESSION: There is bibasilar atelectasis greater on the right than  on the left. No right-sided pneumothorax is evident today. There are bullous changes in the upper lobes bilaterally.   Electronically Signed   By: David  Martinique M.D.   On: 04/06/2015 07:32   Dg Chest Port 1 View  04/05/2015   CLINICAL DATA:  Pneumothorax.  EXAM: PORTABLE CHEST - 1 VIEW  COMPARISON:  04/04/2015.  FINDINGS: Right chest tube noted in stable position. Tiny right pneumothorax is decreased in size. Mediastinum and hilar structures are normal. Cardiomegaly with normal pulmonary vascularity. Persistent bibasilar atelectasis. No pleural effusion.  IMPRESSION: 1. Right chest tube in stable position. Tiny apical pneumothorax has almost completely resolved . 2. Persistent bibasilar atelectasis. 3. Stable cardiomegaly.   Electronically Signed   By: Marcello Moores  Register   On: 04/05/2015 07:56   Portable Chest 1 View  04/04/2015   CLINICAL DATA:  Pneumothorax.  Chest tube placement.  EXAM: PORTABLE CHEST - 1 VIEW  COMPARISON:  Earlier this day at 1859 hours  FINDINGS: Right chest tube, coursing medially tip adjacent pneumomediastinum. Decreased size of the right pneumothorax with small apical component persisting. Question of apical blebs. Mild atelectasis in the right lower lobe. Underlying emphysematous change. No acute abnormality in the left lung. Cardiomediastinal contours are unchanged. There are old left-sided rib fractures.  IMPRESSION: Decreased right pneumothorax post right-sided chest tube placement with small residual apical component. Question small right apical blebs.   Electronically Signed   By: Jeb Levering M.D.   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 The patient also has multiple suspicious pulmonary nodules in the left upper and lower lobes.   PLAN: I had a lengthy discussion with the patient and his children about his current disease stage, prognosis and treatment options. I will complete the staging workup by ordering a MRI of the brain to rule out brain metastasis. I recommended for the patient a course of concurrent chemoradiation with weekly carboplatin for AUC of 2 and paclitaxel 45 MG/M2 to the right lower lobe lung mass in addition to the right hilar lymphadenopathy. I discussed with the patient adverse effect of the chemotherapy including but not limited to alopecia, myelosuppression, nausea and vomiting, peripheral neuropathy, liver or renal dysfunction. He is expected to start the first dose of his concurrent chemoradiation on 05/09/2015. He will have a chemotherapy education class before starting the first dose of his treatment. We will continue to monitor the pulmonary nodules in the left lung closely. The tumor tissue block was sent for molecular testing that if insufficient, I would consider sending blood test to [facility] for molecular biomarker testing. The patient was seen later today by Dr. Pablo Ledger for discussion of the radiotherapy option. He would come back for follow-up visit in 3 weeks for reevaluation. The patient was seen during the multidisciplinary thoracic oncology clinic today by medical oncology, radiation oncology, thoracic navigator, social worker and physical therapist. The patient voices understanding of current disease status and treatment options and is in agreement with the current care plan.  All questions were answered. The patient knows to call the  clinic with any problems, questions or concerns. We can certainly see the patient much sooner if necessary.  Thank you so much for allowing me to participate in the care of [place].  I will continue to follow up the patient with you and assist in his care.  I spent 55 minutes counseling the patient face to face. The total time spent in the appointment was 80 minutes.  Disclaimer: This note was dictated with voice recognition software. Similar sounding words can inadvertently be transcribed and may not be corrected upon review.   Xzaria Teo K. April 21, 2015, 3:02 PM

## 2015-04-21 NOTE — Progress Notes (Signed)
  Radiation Oncology         [phone]) [phone] ________________________________  Initial Outpatient Consultation - Date: 04/21/2015   Name: Gabriel [name] MRN: [id number]   DOB: 14-Mar-1941  REFERRING PHYSICIAN: Grace Isaac, MD  DIAGNOSIS AND STAGE: Stage III NSCLC  HISTORY OF PRESENT ILLNESS:Gabriel [name] is a 74 y.o. male presenting to clinic in regards to recommendation with treatment to his disease. the patient presented to emergency room with shortness of breath and chest pain. He was found to have a hydropneumothorax on the right. A chest tube was placed. A CT scan on 04/06/2015 showed a 4.3cmX4.7cm mass in the right hilar with associated right lower lobe atelectatics. Other pulmonary nodules were observed, including a 1.3X1.6cm left lower lobe nodule. A PET scan was performed on 04/12/2015 that showed uptake in the right lung mass and an SUV of 1.7 in the left lower lobe mass. Multiple other nodules in the lungs were not hot on PET imaging.No mediastinal adenopathy was seen. He underwent bronchoscopy and bx on 04/18/2015, which showed adenocarcinoma. Upon bronchoscopy, he was noted to have tumor in the lower and middle lobe bronchi and a 10R lymph node that was bx was also positive for adenocarcinoma. He is a current smoker. He is accompanied by his son and daughter.   PREVIOUS RADIATION THERAPY: No  Past medical, social and family history were reviewed in the electronic chart. Review of symptoms was reviewed in the electronic chart. Medications were reviewed in the electronic chart. The patient currently engages in smoking.He is employed as an Chief Financial Officer.   PHYSICAL EXAM: Pleasant male. No distress. Normal respiratory effort.   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     PLAN:    We discussed his diagnosis and stage. We discussed definitive chemoradiation in the management of  his disease. We discussed 6 weeks of treatment as an outpatient. We discussed the process of simulation and the placement of tattoos. We discussed dysphagia, weight loss and fatigue as the acute side effects of radiation. We discussed damage to critical normal structures in the chest as well as the spinal cord as possible but improbably. We scheduled him for simulation on July 5th and begin treatments as soon as possible.  He has signed informed consent.   A brain MRI has been ordered to rule out metastatic disease.   We will monitor or possibly D.r Servando Snare will go back and biopsy via bronch the lesion in the left lung.      I spent 40 minutes  face to face with the patient and more than 50% of that time was spent in counseling and/or coordination of care.  This document serves as a record of services personally performed by [name] , MD. It was created on her behalf by Lenn Cal, a trained medical scribe. The creation of this record is based on the scribe's personal observations and the provider's statements to them. This document has been checked and approved by the attending provider.     ------------------------------------------------  [name], MD

## 2015-04-21 NOTE — Telephone Encounter (Signed)
Gave and prnted appt sched adn avs fo rpt for July and Aug

## 2015-04-21 NOTE — Op Note (Signed)
NAMEREID, REGAS NO.:  [id number]  MEDICAL RECORD NO.:  [id number]  LOCATION:  [facility]                         FACILITY:  [facility]  PHYSICIAN:  Lanelle Bal, MD    DATE OF BIRTH:  1941-09-19  DATE OF PROCEDURE:  04/18/2015 DATE OF DISCHARGE:  04/18/2015                              OPERATIVE REPORT   PREOPERATIVE DIAGNOSIS:  Right hilar lung mass.  POSTOPERATIVE DIAGNOSIS:  Right hilar lung mass, non-small cell lung cancer by quick stain cytology.  PROCEDURES PERFORMED:  Bronchoscopy with biopsy of right lower and right middle lobe bronchus and EBUS with transbronchial biopsy of 10R lymph nodes.  SURGEON:  Lanelle Bal, MD  BRIEF HISTORY:  The patient is a 74 year old male, who 10 days prior had presented with a right spontaneous pneumothorax.  A chest tube was placed at that time.  The patient had a long history of smoking and emphysema.  On his initial portable chest film, a left lung lesion was noted.  After the reinflation of his lung and to evaluate his pulmonary disease and left lung lesion, a CT scan was performed.  This confirmed a right hilar mass involving the right lower lobe and also cavitary lesions in the left lung.  The patient had successful removal of his chest tube, was discharged home, and a PET scan and a brain scan was performed.  The patient now returns to biopsy of the right lung lesion. Risks and options were discussed with him in detail.  DESCRIPTION OF PROCEDURE:  The patient underwent general endotracheal anesthesia without incident.  A single-lumen endotracheal tube was placed.  Appropriate time-out was performed.  We proceeded with fiberoptic bronchoscopy to the subsegmental level.  In the left, there were no endobronchial lesions noted.  In the right lung, takeoff of the right upper lobe bronchus appeared normal, but just past the right upper lobe bronchus.  The bronchus became irregular looking with mass involving both  the right middle lobe bronchus and lower lobe bronchus. Brushings were obtained in the right middle lobe bronchus area.  Quick stain on this confirmed adequate tissue for diagnosis and was highly suspicious of non-small cell cancer.  All the remaining tissue was submitted for permanent evaluation to maximize the amount of tissue. Multiple passes with biopsy forceps in the area of the right middle lobe bronchus and the right lower lobe bronchus were obtained and submitted in separate cups for pathology.  We then removed the scope and placed an EBUS scope, and just at the takeoff of the intermediate bronchus, we examined with the EBUS scope and identified 10R nodes on the right. Several passes with the aspirating needle were performed in the area of the 10R lymph nodes and submitted in satellite.  The patient tolerated the procedure without complication, was extubated in the operating room and transferred to the recovery room for further postoperative care.     Lanelle Bal, MD     EG/MEDQ  D:  04/20/2015  T:  04/21/2015  Job:  [id number]

## 2015-04-23 LAB — CULTURE, RESPIRATORY W GRAM STAIN

## 2015-04-25 ENCOUNTER — Telehealth: Payer: Self-pay | Admitting: *Deleted

## 2015-04-25 NOTE — Telephone Encounter (Signed)
I left message to call me back about his "hernia " .

## 2015-04-25 NOTE — Telephone Encounter (Signed)
Oncology Nurse Navigator Documentation  Oncology Nurse Navigator Flowsheets 04/25/2015  Navigator Encounter Type Other/Recieved a message from triage today.  Apparently, patient is having problems with his hernia.  I called and left a vm message to call me to figure out what was going on.  I left my name and phone number  Treatment Phase Other  Coordination of Care MD Appointments  Time Spent with Patient 15

## 2015-04-25 NOTE — Telephone Encounter (Signed)
Pt called and left message re:  Pt was seen last week by Dr. Johnnette Litter, nurse navigator, and other physicians at the lung clinic.  Pt stated he has lower abdominal hernia, and wanted to know if pt should have hernia repaired ASAP prior to starting chemotherapy.  Pt had seen surgeon Dr. Cottie Banda  9 years ago.   Pt would like to talk to either Dr. Julien Nordmann and/or Hinton Dyer, lung navigator nurse. Pt's  Phone     [phone].

## 2015-04-26 ENCOUNTER — Encounter: Payer: Self-pay | Admitting: *Deleted

## 2015-04-26 NOTE — Progress Notes (Signed)
Oncology Nurse Navigator Documentation  Oncology Nurse Navigator Flowsheets 04/26/2015  Navigator Encounter Type Other/I called and spoke with Dr. Hassell Done about patient's concerns regarding hernia.  He stated his office will follow up with patient.    Treatment Phase Pre treatment  Coordination of Care MD Appointments  Time Spent with Patient 15

## 2015-04-26 NOTE — Progress Notes (Signed)
Oncology Nurse Navigator Documentation  Oncology Nurse Navigator Flowsheets 04/26/2015  Navigator Encounter Type Other/I received a message from triage about patient having hernia pain.  I called and he stated that Diane, Dr. Worthy Flank desk nurse called earlier today and is following up with Dr. Julien Nordmann.   Treatment Phase Other  Coordination of Care MD Appointments  Time Spent with Patient 30

## 2015-04-29 ENCOUNTER — Encounter: Payer: Self-pay | Admitting: *Deleted

## 2015-04-29 NOTE — Progress Notes (Signed)
Oncology Nurse Navigator Documentation  Oncology Nurse Navigator Flowsheets 04/29/2015  Navigator Encounter Type Other/Noted in [facility] patient saw general surgery, Dr. Hassell Done today regarding hernia.  Dr. Earlie Server note indicates patient is to precede with lung cancer tx and get supportive device for hernia at this time.  Patient is set up for oncology tx.   Treatment Phase Other  Time Spent with Patient 15

## 2015-05-03 ENCOUNTER — Ambulatory Visit
Admission: RE | Admit: 2015-05-03 | Discharge: 2015-05-03 | Disposition: A | Discharge status: Home / Self Care | Payer: 59 | Source: Ambulatory Visit | Attending: Radiation Oncology | Admitting: Radiation Oncology

## 2015-05-03 DIAGNOSIS — Z510 Encounter for antineoplastic radiation therapy: Secondary | ICD-10-CM | POA: Diagnosis present

## 2015-05-03 DIAGNOSIS — C3491 Malignant neoplasm of unspecified part of right bronchus or lung: Secondary | ICD-10-CM | POA: Insufficient documentation

## 2015-05-03 NOTE — Progress Notes (Signed)
[facility] Simulation and Treatment Planning Note   Name: Gabriel [name] MRN: [id number]  Date: 05/03/2015  DOB: July 05, 1941  Status: outpatient    DIAGNOSIS: Non-small cell carcinoma of right lung, stage 3   Staging form: Lung, AJCC 7th Edition     Clinical stage from 04/21/2015: Stage IIIA (T2a, N2, M0) - Signed by Curt Bears, MD on 04/21/2015     CONSENT VERIFIED: yes   SET UP AND IMMOBILIZATION: Patient is setup supine with arms in a wing board.   NARRATIVE: The patient was brought to the [facility].  Identity was confirmed.  All relevant records and images related to the planned course of therapy were reviewed.  Then, the patient was positioned in a stable reproducible clinical set-up for radiation therapy.  CT images were obtained.  Skin markings were placed.  The CT images were loaded into the planning software where the target and avoidance structures were contoured.  The radiation prescription was entered and confirmed.   TREATMENT PLANNING NOTE:  Treatment planning then occurred. I have requested 3D simulation with [facility] of the spinal cord, total lungs and gross tumor volume. I have also requested mlcs and an isodose plan.   Special treatment procedure will be performed as [name] will be receiving concurrent chemotherapy.   I have ordered a consult with the dietician for monitoring.  I will also be verifying that weekly lab values are appropriate.   A total of 4 complex treatment devices will be utilized in the form of unique MLCs to shield critical normal structures such as the spinal cord and heart.

## 2015-05-04 ENCOUNTER — Ambulatory Visit (HOSPITAL_COMMUNITY)
Admission: RE | Admit: 2015-05-04 | Discharge: 2015-05-04 | Disposition: A | Discharge status: Home / Self Care | Payer: 59 | Source: Ambulatory Visit | Attending: Internal Medicine | Admitting: Internal Medicine

## 2015-05-04 ENCOUNTER — Encounter (HOSPITAL_COMMUNITY): Payer: Self-pay

## 2015-05-04 DIAGNOSIS — Q283 Other malformations of cerebral vessels: Secondary | ICD-10-CM | POA: Diagnosis not present

## 2015-05-04 DIAGNOSIS — I739 Peripheral vascular disease, unspecified: Secondary | ICD-10-CM | POA: Insufficient documentation

## 2015-05-04 DIAGNOSIS — G319 Degenerative disease of nervous system, unspecified: Secondary | ICD-10-CM | POA: Insufficient documentation

## 2015-05-04 DIAGNOSIS — C3491 Malignant neoplasm of unspecified part of right bronchus or lung: Secondary | ICD-10-CM | POA: Insufficient documentation

## 2015-05-04 MED ORDER — GADOBENATE DIMEGLUMINE 529 MG/ML IV SOLN
15.0000 mL | Freq: Once | INTRAVENOUS | Status: AC | PRN
  Administered 2015-05-04: 13 mL via INTRAVENOUS

## 2015-05-09 ENCOUNTER — Other Ambulatory Visit (HOSPITAL_BASED_OUTPATIENT_CLINIC_OR_DEPARTMENT_OTHER): Payer: 59

## 2015-05-09 ENCOUNTER — Ambulatory Visit (HOSPITAL_BASED_OUTPATIENT_CLINIC_OR_DEPARTMENT_OTHER): Payer: 59 | Admitting: Physician Assistant

## 2015-05-09 ENCOUNTER — Ambulatory Visit: Payer: 59

## 2015-05-09 ENCOUNTER — Encounter: Payer: Self-pay | Admitting: Physician Assistant

## 2015-05-09 ENCOUNTER — Ambulatory Visit (HOSPITAL_BASED_OUTPATIENT_CLINIC_OR_DEPARTMENT_OTHER): Payer: 59

## 2015-05-09 ENCOUNTER — Other Ambulatory Visit: Payer: 59

## 2015-05-09 VITALS — BP 169/86 | HR 88 | Temp 97.7°F | Resp 18 | Wt 140.8 lb

## 2015-05-09 VITALS — BP 142/81 | HR 75 | Temp 97.9°F | Resp 20

## 2015-05-09 DIAGNOSIS — R918 Other nonspecific abnormal finding of lung field: Secondary | ICD-10-CM

## 2015-05-09 DIAGNOSIS — C3431 Malignant neoplasm of lower lobe, right bronchus or lung: Secondary | ICD-10-CM

## 2015-05-09 DIAGNOSIS — C342 Malignant neoplasm of middle lobe, bronchus or lung: Secondary | ICD-10-CM | POA: Diagnosis not present

## 2015-05-09 DIAGNOSIS — C3491 Malignant neoplasm of unspecified part of right bronchus or lung: Secondary | ICD-10-CM

## 2015-05-09 DIAGNOSIS — Z5111 Encounter for antineoplastic chemotherapy: Secondary | ICD-10-CM

## 2015-05-09 LAB — COMPREHENSIVE METABOLIC PANEL (CC13)
ALBUMIN: 4 g/dL (ref 3.5–5.0)
ALT: 16 U/L (ref 0–55)
AST: 19 U/L (ref 5–34)
Alkaline Phosphatase: 80 U/L (ref 40–150)
Anion Gap: 8 mEq/L (ref 3–11)
BUN: 18.9 mg/dL (ref 7.0–26.0)
CALCIUM: 9.8 mg/dL (ref 8.4–10.4)
CO2: 24 meq/L (ref 22–29)
Chloride: 106 mEq/L (ref 98–109)
Creatinine: 1 mg/dL (ref 0.7–1.3)
EGFR: 75 mL/min/{1.73_m2} — ABNORMAL LOW (ref >90)
GLUCOSE: 110 mg/dL (ref 70–140)
POTASSIUM: 4.3 meq/L (ref 3.5–5.1)
Sodium: 138 mEq/L (ref 136–145)
Total Bilirubin: 0.43 mg/dL (ref 0.20–1.20)
Total Protein: 7.1 g/dL (ref 6.4–8.3)

## 2015-05-09 LAB — CBC WITH DIFFERENTIAL/PLATELET
BASO%: 0.8 % (ref 0.0–2.0)
Basophils Absolute: 0.1 10*3/uL (ref 0.0–0.1)
EOS%: 1.2 % (ref 0.0–7.0)
Eosinophils Absolute: 0.1 10*3/uL (ref 0.0–0.5)
HCT: 46 % (ref 38.4–49.9)
HGB: 15.4 g/dL (ref 13.0–17.1)
LYMPH#: 1.4 10*3/uL (ref 0.9–3.3)
LYMPH%: 16 % (ref 14.0–49.0)
MCH: 29.7 pg (ref 27.2–33.4)
MCHC: 33.4 g/dL (ref 32.0–36.0)
MCV: 89.1 fL (ref 79.3–98.0)
MONO#: 0.7 10*3/uL (ref 0.1–0.9)
MONO%: 7.9 % (ref 0.0–14.0)
NEUT#: 6.6 10*3/uL — ABNORMAL HIGH (ref 1.5–6.5)
NEUT%: 74.1 % (ref 39.0–75.0)
Platelets: 225 10*3/uL (ref 140–400)
RBC: 5.17 10*6/uL (ref 4.20–5.82)
RDW: 13.9 % (ref 11.0–14.6)
WBC: 8.9 10*3/uL (ref 4.0–10.3)

## 2015-05-09 MED ORDER — METHYLPREDNISOLONE SODIUM SUCC 40 MG IJ SOLR
40.0000 mg | Freq: Once | INTRAMUSCULAR | Status: AC
  Administered 2015-05-09: 40 mg via INTRAVENOUS

## 2015-05-09 MED ORDER — DIPHENHYDRAMINE HCL 50 MG/ML IJ SOLN
50.0000 mg | Freq: Once | INTRAMUSCULAR | Status: AC
  Administered 2015-05-09: 50 mg via INTRAVENOUS

## 2015-05-09 MED ORDER — SODIUM CHLORIDE 0.9 % IV SOLN
Freq: Once | INTRAVENOUS | Status: AC
  Administered 2015-05-09: 13:00:00 via INTRAVENOUS
  Filled 2015-05-09: qty 8

## 2015-05-09 MED ORDER — SODIUM CHLORIDE 0.9 % IV SOLN
Freq: Once | INTRAVENOUS | Status: AC
  Administered 2015-05-09: 13:00:00 via INTRAVENOUS

## 2015-05-09 MED ORDER — DIPHENHYDRAMINE HCL 50 MG/ML IJ SOLN
INTRAMUSCULAR | Status: AC
  Filled 2015-05-09: qty 1

## 2015-05-09 MED ORDER — SODIUM CHLORIDE 0.9 % IV SOLN
160.0000 mg | Freq: Once | INTRAVENOUS | Status: AC
  Administered 2015-05-09: 160 mg via INTRAVENOUS
  Filled 2015-05-09: qty 16

## 2015-05-09 MED ORDER — FAMOTIDINE IN NACL 20-0.9 MG/50ML-% IV SOLN
20.0000 mg | Freq: Once | INTRAVENOUS | Status: AC
  Administered 2015-05-09: 20 mg via INTRAVENOUS

## 2015-05-09 MED ORDER — DEXTROSE 5 % IV SOLN
45.0000 mg/m2 | Freq: Once | INTRAVENOUS | Status: AC
  Administered 2015-05-09: 78 mg via INTRAVENOUS
  Filled 2015-05-09: qty 13

## 2015-05-09 MED ORDER — FAMOTIDINE IN NACL 20-0.9 MG/50ML-% IV SOLN
INTRAVENOUS | Status: AC
  Filled 2015-05-09: qty 50

## 2015-05-09 MED ORDER — METHYLPREDNISOLONE SODIUM SUCC 40 MG IJ SOLR
INTRAMUSCULAR | Status: AC
  Filled 2015-05-09: qty 1

## 2015-05-09 NOTE — Progress Notes (Addendum)
No images are attached to the encounter. No scans are attached to the encounter. No scans are attached to the encounter. [place] SHARED VISIT PROGRESS NOTE  No PCP Per Patient No address on file  DIAGNOSIS: Non-small cell carcinoma of right lung, stage 3   Staging form: Lung, AJCC 7th Edition     Clinical stage from 04/21/2015: Stage IIIA (T2a, N2, M0) - Signed by Curt Bears, MD on 04/21/2015  Foundation One molecular studies reveal that he was positive for [place], [place], and STAG2 [id number]*20. He was negative for: EGFR, ALK, BRAF, MET, RET, ERBB2 and ROS1  PRIOR THERAPY: none  CURRENT THERAPY: Concurrent chemoradiation with chemotherapy in the form of weekly carboplatin for AUC 2 paclitaxel 45 mg/m given concurrent with radiation therapy. First cycle expected 05/09/2015  INTERVAL HISTORY: Gabriel MONFORTE89y.[id number]. male returns for a scheduled regular office visit for followup of his recently diagnosed stage IIIa non-small cell lung cancer. He presents to receive his first cycle of  weekly chemotherapy with carboplatin and paclitaxel. He is scheduled to have port films 05/10/2015 with his first fraction of radiation therapy on 05/11/2015. He is accompanied today by [name] and [name] He states that he does not have anything for nausea at home. He reports some tooth pain/discomfort but he has not been evaluated by his dentist. He is not sure whether he will need just antibiotics, or a root canal, or what will be involved. He has inquired about his Foundation One molecular study results. He is negative for EGFR, ALK, BRAF, MET, RET, ERBB2 and ROS 1. He was positive for KRAS as outlined above. Today he feels fine, denies any fever, chills, cough, shortness of breath or hemoptysis. He denied any nausea vomiting, diarrhea or constipation.  MEDICAL HISTORY: Past Medical History  Diagnosis Date  . Spontaneous pneumothorax     HISTORY  OF (ONLY)  . Cancer     skin cancer  . Lung mass     right  . Inguinal hernia     ALLERGIES:  is allergic to aleve.  MEDICATIONS:  Current Outpatient Prescriptions  Medication Sig Dispense Refill  . Ascorbic Acid (VITAMIN C) 1000 MG tablet Take 1,000 mg by mouth daily.    .Marland Kitchenb complex vitamins tablet Take 1 tablet by mouth daily.    . calcium-vitamin D (OSCAL WITH D) 500-200 MG-UNIT per tablet Take 1 tablet by mouth daily.    . Cyanocobalamin (VITAMIN B 12 PO) Take 1 tablet by mouth daily.    . Ginkgo Biloba 40 MG TABS Take 40 mg by mouth daily.    .Marland KitchenGINSENG PO Take 1 each by mouth.    .Marland KitchenL-Arginine 1000 MG TABS Take 1,000 mg by mouth daily.    . magnesium 30 MG tablet Take 30 mg by mouth daily.    . Multiple Vitamin (MULTI VITAMIN DAILY PO) Take 1 tablet by mouth daily.    . Omega-3 Fatty Acids (FISH OIL) 1000 MG CAPS Take 1,000 mg by mouth daily.    .Marland KitchenoxyCODONE (OXY IR/ROXICODONE) 5 MG immediate release tablet Take 1 tablet (5 mg total) by mouth every 4 (four) hours as needed for moderate pain. 30 tablet 0  . potassium chloride (K-DUR) 10 MEQ tablet Take 10 mEq by mouth daily.    . prochlorperazine (COMPAZINE) 10 MG tablet Take 1 tablet (10 mg total) by mouth every 6 (six) hours as needed for nausea or vomiting. 3[place]  tablet 0  . vitamin A 10000 UNIT capsule Take 10,000 Units by mouth daily.     No current facility-administered medications for this visit.    SURGICAL HISTORY:  Past Surgical History  Procedure Laterality Date  . Hernia repair  2011  . Colonoscopy w/ biopsies and polypectomy    . Wisdom tooth extraction    . Tonsillectomy    . Video bronchoscopy with endobronchial ultrasound N/A 04/18/2015    Procedure: VIDEO BRONCHOSCOPY WITH ENDOBRONCHIAL ULTRASOUND;  Surgeon: Grace Isaac, MD;  Location: [facility] OR;  Service: Thoracic;  Laterality: N/A;    REVIEW OF SYSTEMS:  Review of Systems  Constitutional: Negative for fever, chills, weight loss, malaise/fatigue and  diaphoresis.  HENT: Negative for congestion, ear discharge, ear pain, hearing loss, nosebleeds, sore throat and tinnitus.        Patient has minor tooth pain and will contact his dentist for evaluation and treatment  Eyes: Negative for blurred vision, double vision, photophobia, pain, discharge and redness.  Respiratory: Negative for cough, hemoptysis, sputum production, shortness of breath, wheezing and stridor.   Cardiovascular: Negative for chest pain, palpitations, orthopnea, claudication, leg swelling and PND.  Gastrointestinal: Negative for heartburn, nausea, vomiting, abdominal pain, diarrhea, constipation, blood in stool and melena.  Genitourinary: Negative.   Musculoskeletal: Negative.   Skin: Negative.   Neurological: Negative for dizziness, tingling, focal weakness, seizures, weakness and headaches.  Endo/Heme/Allergies: Does not bruise/bleed easily.  Psychiatric/Behavioral: Negative for depression. The patient is not nervous/anxious and does not have insomnia.      PHYSICAL EXAMINATION: Physical Exam  Constitutional: He is oriented to person, place, and time and well-developed, well-nourished, and in no distress.  HENT:  Head: Normocephalic and atraumatic.  Mouth/Throat: Oropharynx is clear and moist.  Eyes: Pupils are equal, round, and reactive to light.  Neck: Normal range of motion. Neck supple. No JVD present. No tracheal deviation present. No thyromegaly present.  Cardiovascular: Normal rate, regular rhythm, normal heart sounds and intact distal pulses.  Exam reveals no gallop and no friction rub.   No murmur heard. Pulmonary/Chest: Effort normal and breath sounds normal. No respiratory distress. He has no wheezes. He has no rales.  Abdominal: Soft. Bowel sounds are normal. He exhibits no distension and no mass. There is no tenderness.  Musculoskeletal: Normal range of motion. He exhibits no edema or tenderness.  Lymphadenopathy:    He has no cervical adenopathy.   Neurological: He is alert and oriented to person, place, and time. He has normal reflexes. Gait normal.  Skin: Skin is warm and dry. No rash noted.    ECOG PERFORMANCE STATUS: 0 - Asymptomatic  Blood pressure 169/86, pulse 88, temperature 97.7 F (36.5 C), temperature source Oral, resp. rate 18, weight 140 lb 12.8 oz (63.866 kg), SpO2 97 %.  LABORATORY DATA: Lab Results  Component Value Date   WBC 8.9 05/09/2015   HGB 15.4 05/09/2015   HCT 46.0 05/09/2015   MCV 89.1 05/09/2015   PLT 225 05/09/2015      Chemistry      Component Value Date/Time   NA 138 05/09/2015 0924   NA 137 04/18/2015 1106   K 4.3 05/09/2015 0924   K 3.9 04/18/2015 1106   CL 104 04/18/2015 1106   CO2 24 05/09/2015 0924   CO2 24 04/18/2015 1106   BUN 18.9 05/09/2015 0924   BUN 17 04/18/2015 1106   CREATININE 1.0 05/09/2015 0924   CREATININE 0.89 04/18/2015 1106      Component Value  Date/Time   CALCIUM 9.8 05/09/2015 0924   CALCIUM 9.4 04/18/2015 1106   ALKPHOS 80 05/09/2015 0924   ALKPHOS 69 04/18/2015 1106   AST 19 05/09/2015 0924   AST 21 04/18/2015 1106   ALT 16 05/09/2015 0924   ALT 20 04/18/2015 1106   BILITOT 0.43 05/09/2015 0924   BILITOT 0.7 04/18/2015 1106       RADIOGRAPHIC STUDIES:  Dg Chest 2 View  04/18/2015   CLINICAL DATA:  Preop bronchoscopy for right lung mass  EXAM: CHEST  2 VIEW  COMPARISON:  04/08/2015  FINDINGS: Improved right lower lobe inflation. An ill-defined right hilar mass is again noted, recently evaluated by PET-CT. Emphysema. There is no edema, consolidation, effusion, or pneumothorax. Remote left rib fractures.  IMPRESSION: 1. No active cardiopulmonary disease. 2. Right perihilar mass. Postobstructive changes in the right lower lobe are improved since 04/08/2015.   Electronically Signed   By: Monte Fantasia M.D.   On: 04/18/2015 11:00   Mr Jeri Cos TK Contrast  05/04/2015   CLINICAL DATA:  Lung cancer.  Staging.  EXAM: MRI HEAD WITHOUT AND WITH CONTRAST   TECHNIQUE: Multiplanar, multiecho pulse sequences of the brain and surrounding structures were obtained without and with intravenous contrast.  CONTRAST:  61m MULTIHANCE GADOBENATE DIMEGLUMINE 529 MG/ML IV SOLN  COMPARISON:  PET scan 04/12/2015.  FINDINGS: No evidence for acute infarction, hemorrhage, mass lesion, hydrocephalus, or extra-axial fluid. Moderate atrophy not unexpected for age. Mild subcortical and periventricular T2 and FLAIR hyperintensities, likely chronic microvascular ischemic change. Flow voids are maintained throughout the carotid, basilar, and vertebral arteries. There are no areas of chronic hemorrhage. Pituitary, pineal, and cerebellar tonsils unremarkable.  Focal area of loss of the normal marrow signal in the clivus seen best on image 13 series 3, 4 x 6 mm, does show postcontrast enhancement. Skull base metastasis could have this appearance. This area is incompletely evaluated on recent PET scan but not clearly abnormal on that study. Continued short-term surveillance warranted. No upper cervical or calvarial osseous metastases elsewhere.  Post infusion, no abnormal enhancement of the brain or meninges. Incidental venous angioma LEFT frontal white matter. Major dural venous sinuses are patent. Extracranial soft tissues are unremarkable.  IMPRESSION: Moderate atrophy with mild small vessel disease.  No intracranial metastatic disease is evident.  Focal area of marrow heterogeneity within the clivus, subcentimeter size, is indeterminate but short-term follow-up recommended as osseous metastatic disease could have this appearance.   Electronically Signed   By: JStaci RighterM.D.   On: 05/04/2015 21:02   Nm Pet Image Initial (pi) Skull Base To Thigh  04/12/2015   ADDENDUM REPORT: 04/12/2015 15:32  ADDENDUM: Multiple lesions in the left lung were not described in the original report. There are as follows:  --12 x 11 mm subpleural nodular opacity in the lateral left lung apex (series 3/image  66), max SUV 1.2  --12 x 9 mm irregular nodular opacity in the medial left upper lobe (series 3/image 72), max SUV 1.4  --14 x 18 mm cavitary lesion in the posterior left upper lobe with 10 mm solid component (series 3/image 78), max SUV 1.4  --21 x 23 mm cavitary lesion in the superior left lower lobe with 9 mm solid component (series 3/image 94), max SUV 1.7  --14 x 10 mm partially cavitary nodule at the left lung base, max SUV 0.7  Of these lesions, the 2.3 cm cavitary lesion in the superior left lower lobe is considered most suspicious for malignancy,  possibly synchronous primary bronchogenic neoplasm. However, given the presence of multiple cavitary lesions, infection is also possible. Contralateral cavitary pulmonary metastases would be unusual in the absence of a necrotic/cavitary primary or nodal disease, and is therefore considered less likely.  Consider short-term follow-up in 4-6 weeks after appropriate antimicrobial therapy to assess for resolution.  ADDENDED IMPRESSION:  4.0 x 3.0 cm hypermetabolic right perihilar mass-like opacity, suspicious for primary bronchogenic neoplasm, as described above. Consider bronchoscopy for further evaluation.  Multiple nodular/cavitary opacities in the left lung, including a dominant 2.3 cm cavitary lesion in the superior left lower lobe which is considered is suspicious for possible synchronous primary bronchogenic neoplasm. Given the presence of multiple lesions, consider short-term follow-up in 4-6 weeks after appropriate antimicrobial therapy to assess for resolution.  These results were called by telephone at the time of interpretation on 04/12/2015 at 3:20 pm to Dr. Lanelle Bal, who verbally acknowledged these results.   Electronically Signed   By: Julian Hy M.D.   On: 04/12/2015 15:32   04/12/2015   CLINICAL DATA:  Initial treatment strategy for right lung mass.  EXAM: NUCLEAR MEDICINE PET SKULL BASE TO THIGH  TECHNIQUE: 12.61 mCi F-18 FDG was  injected intravenously. Full-ring PET imaging was performed from the skull base to thigh after the radiotracer. CT data was obtained and used for attenuation correction and anatomic localization.  FASTING BLOOD GLUCOSE:  Value: 98 mg/dl  COMPARISON:  CT chest dated 04/05/2015  FINDINGS: NECK  No hypermetabolic lymph nodes in the neck.  9 mm right thyroid nodule (series 3/ image 61), max SUV 3.7.  CHEST  4.0 x 3.0 cm right perihilar mass-like opacity (series 3/ image 104), max SUV 8.0, suspicious for primary bronchogenic neoplasm. The lesion abuts the major fissure and straddles the posterior right upper and superior right lower lobes.  Notably, this does not reflect a true endobronchial lesion, although this does compress the right lower and middle lobe airway, with associated debris within the right lower lobe bronchus and associated postobstructive opacity the in the posterior right upper and middle lobes.  No associated hypermetabolic thoracic lymphadenopathy.  The heart is normal in size. No pericardial effusion. Coronary atherosclerosis. Atherosclerotic calcifications of the aortic arch.  ABDOMEN/PELVIS  No abnormal hypermetabolic activity within the liver, pancreas, adrenal glands, or spleen.  No hypermetabolic lymph nodes in the abdomen or pelvis.  Atherosclerotic calcifications of the abdominal aorta and branch vessels.  Prostatomegaly, with enlargement of the central gland which indents the base of the bladder. Bladder is mildly thick-walled although underdistended.  SKELETON  No focal hypermetabolic activity to suggest skeletal metastasis.  IMPRESSION: 4.0 x 3.0 cm hyper mid right perihilar mass-like opacity, suspicious for primary bronchogenic neoplasm. The lesion abuts the major fissure and straddles the posterior right upper and superior right lower lobes.  No evidence of metastatic disease.  9 mm hypermetabolic right thyroid nodule, indeterminate.  Electronically Signed: By: Julian Hy M.D. On:  04/12/2015 14:48   Dg Chest Port 1 View  04/18/2015   CLINICAL DATA:  Postop.  Lung mass.  Inguinal hernia.  EXAM: PORTABLE CHEST - 1 VIEW  COMPARISON:  CT of 04/05/2015. Most recent plain film of earlier today.  FINDINGS: 1413 hours. Remote left rib fractures. Midline trachea. Mild cardiomegaly. No pleural effusion or pneumothorax. Pulmonary interstitial thickening is increased. Right infrahilar volume loss again identified.  IMPRESSION: No pneumothorax.  Persistent right infrahilar volume loss. Right perihilar mass is less well delineated today.  Pulmonary interstitial prominence is  slightly increased. Possibly related to COPD/chronic bronchitis. Cannot exclude mild pulmonary venous congestion.   Electronically Signed   By: Abigail Miyamoto M.D.   On: 04/18/2015 14:34     ASSESSMENT/PLAN:  No problem-specific assessment & plan notes found for this encounter.  patient is very pleasant 74 year old Caucasian male recently diagnosed with Stage IIIA (T2a, N2, M0) non-small cell lung cancer, adenocarcinoma. He presents to start his course of concurrent chemoradiation. First weekly cycle of systemic chemotherapy with carboplatin for an AUC of 2 and paclitaxel 45 mg/m will be given today (05/09/2015). He will a port films on 05/10/2015 and his first fraction of radiation therapy on 05/11/2015. Contacted patient's pharmacy and he does have a prescription for Compazine available to him unable have it ready for him today. He will follow-up in 2 weeks for symptom management visit.  Awilda Metro E, PA-C 05/09/2015  All questions were answered. The patient knows to call the clinic with any problems, questions or concerns. We can certainly see the patient much sooner if necessary.  ADDENDUM: Hematology/Oncology Attending: I had a face to face encounter with the patient today. I recommended his care plan. 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 His molecular studies showed no EGFR mutation or ALK gene translocation. The patient is here today to start the first cycle of concurrent chemoradiation to the lesions in the right lung. We will continue to monitor the lesion in the left lung as well as the questionable bone lesion and the skull closely on the upcoming scans. He will proceed with the first cycle of his concurrent chemoradiation today as a scheduled. The patient would come back for follow-up visit in 2 weeks for reevaluation and management of any adverse effect of his treatment. He was advised to call immediately if he has any concerning symptoms in the interval.  Disclaimer: This note was dictated with voice recognition software. Similar sounding words can inadvertently be transcribed and may be missed upon review. Eilleen Kempf., MD 05/09/2015

## 2015-05-09 NOTE — Progress Notes (Signed)
1725-Pt states that numbness remains to bilateral cheeks, chin and across his lips.  No difficulty swallowing or SOB.  Pt has no other complaints at this time.  Pt instructed that if numbness gets worse to directly report to the ER.  Pt and his son and daughter verbalize an understanding to do so.  Pt has no further questions at this time.

## 2015-05-09 NOTE — Patient Instructions (Signed)
[facility] Discharge Instructions for Patients Receiving Chemotherapy  Today you received the following chemotherapy agents TAXOL, CARBOPLATIN  To help prevent nausea and vomiting after your treatment, we encourage you to take your nausea medication compazine 10 mg every 6 hours as needed. May start tonight.   If you develop nausea and vomiting that is not controlled by your nausea medication, call the clinic.   BELOW ARE SYMPTOMS THAT SHOULD BE REPORTED IMMEDIATELY:  *FEVER GREATER THAN 100.5 F  *CHILLS WITH OR WITHOUT FEVER  NAUSEA AND VOMITING THAT IS NOT CONTROLLED WITH YOUR NAUSEA MEDICATION  *UNUSUAL SHORTNESS OF BREATH  *UNUSUAL BRUISING OR BLEEDING  TENDERNESS IN MOUTH AND THROAT WITH OR WITHOUT PRESENCE OF ULCERS  *URINARY PROBLEMS  *BOWEL PROBLEMS  UNUSUAL RASH Items with * indicate a potential emergency and should be followed up as soon as possible.  Feel free to call the clinic you have any questions or concerns. The clinic phone number is [phone].  Please show the [place] at check-in to the Emergency Department and triage nurse.

## 2015-05-09 NOTE — Progress Notes (Signed)
64 Pt c/o of "cottoney feeling on my face; like there is a cobweb on my face"  No other c/o "just feels different" VSS; no c/o pain  Dr. Julien Nordmann @ chairside.  Solumedrol 40 mg IVP given per protocol and will observe pt for 20mn.

## 2015-05-09 NOTE — Patient Instructions (Signed)
Continue labs chemotherapy ans radiation as scheduled. Follow up in 2 weeks

## 2015-05-10 ENCOUNTER — Ambulatory Visit
Admission: RE | Admit: 2015-05-10 | Discharge: 2015-05-10 | Disposition: A | Discharge status: Home / Self Care | Payer: 59 | Source: Ambulatory Visit | Attending: Radiation Oncology | Admitting: Radiation Oncology

## 2015-05-10 ENCOUNTER — Telehealth: Payer: Self-pay

## 2015-05-10 ENCOUNTER — Ambulatory Visit: Payer: 59

## 2015-05-10 DIAGNOSIS — Z510 Encounter for antineoplastic radiation therapy: Secondary | ICD-10-CM | POA: Diagnosis not present

## 2015-05-10 NOTE — Telephone Encounter (Signed)
S/w pt: the numbness gradually went away and no problems since. Denied nausea, vomiting, bowel problems, fever. Instructed to call if any questions or concerns arise.

## 2015-05-10 NOTE — Telephone Encounter (Signed)
-----   Message from [place], RN sent at 05/09/2015  6:28 PM EDT ----- Regarding: [facility] f/u call  Contact: [phone] First time Taxol and Carboplatin-Pt had numbness and tingling to his face at the end of his infusion. Dr. Julien Nordmann aware and ordered additional 40 mg Solumedrol.  At time of discharge, numbness was slightly better.  MD aware.  Pt instructed to go to the ED if numbness worsened.

## 2015-05-11 ENCOUNTER — Ambulatory Visit: Payer: 59

## 2015-05-11 ENCOUNTER — Ambulatory Visit
Admission: RE | Admit: 2015-05-11 | Discharge: 2015-05-11 | Disposition: A | Discharge status: Home / Self Care | Payer: 59 | Source: Ambulatory Visit | Attending: Radiation Oncology | Admitting: Radiation Oncology

## 2015-05-11 DIAGNOSIS — Z510 Encounter for antineoplastic radiation therapy: Secondary | ICD-10-CM | POA: Diagnosis not present

## 2015-05-12 ENCOUNTER — Ambulatory Visit
Admission: RE | Admit: 2015-05-12 | Discharge: 2015-05-12 | Disposition: A | Discharge status: Home / Self Care | Payer: 59 | Source: Ambulatory Visit | Attending: Radiation Oncology | Admitting: Radiation Oncology

## 2015-05-12 ENCOUNTER — Ambulatory Visit: Payer: 59

## 2015-05-12 DIAGNOSIS — Z510 Encounter for antineoplastic radiation therapy: Secondary | ICD-10-CM | POA: Diagnosis not present

## 2015-05-13 ENCOUNTER — Ambulatory Visit
Admission: RE | Admit: 2015-05-13 | Discharge: 2015-05-13 | Disposition: A | Discharge status: Home / Self Care | Payer: 59 | Source: Ambulatory Visit | Attending: Radiation Oncology | Admitting: Radiation Oncology

## 2015-05-13 ENCOUNTER — Ambulatory Visit: Payer: 59

## 2015-05-13 DIAGNOSIS — C3491 Malignant neoplasm of unspecified part of right bronchus or lung: Secondary | ICD-10-CM

## 2015-05-13 DIAGNOSIS — Z510 Encounter for antineoplastic radiation therapy: Secondary | ICD-10-CM | POA: Diagnosis not present

## 2015-05-13 NOTE — Progress Notes (Signed)
Pt here for patient teaching.  Pt given Radiation and You booklet and skin care instructions. Reviewed areas of pertinence such as fatigue, hair loss, skin changes, throat changes, cough, shortness of breath, earaches and taste changes . Pt able to give teach back of to pat skin, use unscented/gentle soap and drink plenty of water,apply Radiaplex bid. Pt demonstrated understanding, needs reinforcement and verbalizes understanding of information given and will contact nursing with any questions or concerns.

## 2015-05-16 ENCOUNTER — Ambulatory Visit: Payer: 59

## 2015-05-16 ENCOUNTER — Ambulatory Visit
Admission: RE | Admit: 2015-05-16 | Discharge: 2015-05-16 | Disposition: A | Discharge status: Home / Self Care | Payer: 59 | Source: Ambulatory Visit | Attending: Radiation Oncology | Admitting: Radiation Oncology

## 2015-05-16 ENCOUNTER — Other Ambulatory Visit (HOSPITAL_BASED_OUTPATIENT_CLINIC_OR_DEPARTMENT_OTHER): Payer: 59

## 2015-05-16 ENCOUNTER — Ambulatory Visit (HOSPITAL_BASED_OUTPATIENT_CLINIC_OR_DEPARTMENT_OTHER): Payer: 59

## 2015-05-16 VITALS — BP 137/75 | HR 76 | Temp 98.3°F | Resp 16

## 2015-05-16 DIAGNOSIS — C342 Malignant neoplasm of middle lobe, bronchus or lung: Secondary | ICD-10-CM

## 2015-05-16 DIAGNOSIS — Z5111 Encounter for antineoplastic chemotherapy: Secondary | ICD-10-CM

## 2015-05-16 DIAGNOSIS — C3431 Malignant neoplasm of lower lobe, right bronchus or lung: Secondary | ICD-10-CM

## 2015-05-16 DIAGNOSIS — C3491 Malignant neoplasm of unspecified part of right bronchus or lung: Secondary | ICD-10-CM

## 2015-05-16 DIAGNOSIS — Z510 Encounter for antineoplastic radiation therapy: Secondary | ICD-10-CM | POA: Diagnosis not present

## 2015-05-16 LAB — CBC WITH DIFFERENTIAL/PLATELET
BASO%: 0.5 % (ref 0.0–2.0)
Basophils Absolute: 0 10*3/uL (ref 0.0–0.1)
EOS%: 1.2 % (ref 0.0–7.0)
Eosinophils Absolute: 0.1 10*3/uL (ref 0.0–0.5)
HCT: 45.8 % (ref 38.4–49.9)
HGB: 15.4 g/dL (ref 13.0–17.1)
LYMPH#: 1.5 10*3/uL (ref 0.9–3.3)
LYMPH%: 15.5 % (ref 14.0–49.0)
MCH: 29.9 pg (ref 27.2–33.4)
MCHC: 33.7 g/dL (ref 32.0–36.0)
MCV: 88.7 fL (ref 79.3–98.0)
MONO#: 0.7 10*3/uL (ref 0.1–0.9)
MONO%: 7.6 % (ref 0.0–14.0)
NEUT#: 7.1 10*3/uL — ABNORMAL HIGH (ref 1.5–6.5)
NEUT%: 75.2 % — ABNORMAL HIGH (ref 39.0–75.0)
PLATELETS: 234 10*3/uL (ref 140–400)
RBC: 5.17 10*6/uL (ref 4.20–5.82)
RDW: 13.9 % (ref 11.0–14.6)
WBC: 9.4 10*3/uL (ref 4.0–10.3)

## 2015-05-16 LAB — COMPREHENSIVE METABOLIC PANEL (CC13)
ALK PHOS: 78 U/L (ref 40–150)
ALT: 19 U/L (ref 0–55)
AST: 19 U/L (ref 5–34)
Albumin: 4.1 g/dL (ref 3.5–5.0)
Anion Gap: 8 mEq/L (ref 3–11)
BILIRUBIN TOTAL: 0.73 mg/dL (ref 0.20–1.20)
BUN: 23.2 mg/dL (ref 7.0–26.0)
CALCIUM: 9.7 mg/dL (ref 8.4–10.4)
CHLORIDE: 105 meq/L (ref 98–109)
CO2: 23 meq/L (ref 22–29)
Creatinine: 0.9 mg/dL (ref 0.7–1.3)
EGFR: 85 mL/min/{1.73_m2} — ABNORMAL LOW (ref >90)
Glucose: 102 mg/dl (ref 70–140)
POTASSIUM: 4.2 meq/L (ref 3.5–5.1)
Sodium: 136 mEq/L (ref 136–145)
Total Protein: 6.9 g/dL (ref 6.4–8.3)

## 2015-05-16 LAB — FUNGUS CULTURE W SMEAR: Fungal Smear: NONE SEEN

## 2015-05-16 MED ORDER — FAMOTIDINE IN NACL 20-0.9 MG/50ML-% IV SOLN
INTRAVENOUS | Status: AC
  Filled 2015-05-16: qty 50

## 2015-05-16 MED ORDER — SODIUM CHLORIDE 0.9 % IV SOLN
Freq: Once | INTRAVENOUS | Status: AC
  Administered 2015-05-16: 14:00:00 via INTRAVENOUS

## 2015-05-16 MED ORDER — FAMOTIDINE IN NACL 20-0.9 MG/50ML-% IV SOLN
20.0000 mg | Freq: Once | INTRAVENOUS | Status: AC
  Administered 2015-05-16: 20 mg via INTRAVENOUS

## 2015-05-16 MED ORDER — SODIUM CHLORIDE 0.9 % IV SOLN
Freq: Once | INTRAVENOUS | Status: AC
  Administered 2015-05-16: 14:00:00 via INTRAVENOUS
  Filled 2015-05-16: qty 50

## 2015-05-16 MED ORDER — BIAFINE EX EMUL
CUTANEOUS | Status: DC | PRN
  Administered 2015-05-16: 16:00:00 via TOPICAL

## 2015-05-16 MED ORDER — DIPHENHYDRAMINE HCL 50 MG/ML IJ SOLN
50.0000 mg | Freq: Once | INTRAMUSCULAR | Status: DC
  Administered 2015-05-16: 50 mg via INTRAVENOUS

## 2015-05-16 MED ORDER — PACLITAXEL CHEMO INJECTION 300 MG/50ML
45.0000 mg/m2 | Freq: Once | INTRAVENOUS | Status: AC
  Administered 2015-05-16: 78 mg via INTRAVENOUS
  Filled 2015-05-16: qty 13

## 2015-05-16 MED ORDER — SODIUM CHLORIDE 0.9 % IV SOLN
163.8000 mg | Freq: Once | INTRAVENOUS | Status: AC
  Administered 2015-05-16: 160 mg via INTRAVENOUS
  Filled 2015-05-16: qty 16

## 2015-05-16 MED ORDER — DIPHENHYDRAMINE HCL 50 MG/ML IJ SOLN
INTRAMUSCULAR | Status: AC
  Filled 2015-05-16: qty 1

## 2015-05-16 MED ORDER — SODIUM CHLORIDE 0.9 % IV SOLN
Freq: Once | INTRAVENOUS | Status: AC
  Administered 2015-05-16: 14:00:00 via INTRAVENOUS
  Filled 2015-05-16: qty 8

## 2015-05-17 ENCOUNTER — Ambulatory Visit
Admission: RE | Admit: 2015-05-17 | Discharge: 2015-05-17 | Disposition: A | Discharge status: Home / Self Care | Payer: 59 | Source: Ambulatory Visit | Attending: Radiation Oncology | Admitting: Radiation Oncology

## 2015-05-17 ENCOUNTER — Ambulatory Visit: Payer: 59

## 2015-05-17 VITALS — BP 147/80 | HR 98 | Temp 97.8°F | Wt 141.0 lb

## 2015-05-17 DIAGNOSIS — C3491 Malignant neoplasm of unspecified part of right bronchus or lung: Secondary | ICD-10-CM

## 2015-05-17 DIAGNOSIS — Z510 Encounter for antineoplastic radiation therapy: Secondary | ICD-10-CM | POA: Diagnosis not present

## 2015-05-17 NOTE — Progress Notes (Signed)
Weekly Management Note Current Dose:10 Gy  Projected Dose:60 Gy   Narrative:  The patient presents for routine under treatment assessment.  CBCT/MVCT images/Port film x-rays were reviewed.  The chart was checked. Doing well. No complaints.   Physical Findings:  Unchanged  Vitals:  Filed Vitals:   05/17/15 1550  BP: 147/80  Pulse: 98  Temp: 97.8 F (36.6 C)   Weight:  Wt Readings from Last 3 Encounters:  05/17/15 141 lb (63.957 kg)  05/09/15 140 lb 12.8 oz (63.866 kg)  05/04/15 140 lb (63.504 kg)   Lab Results  Component Value Date   WBC 9.4 05/16/2015   HGB 15.4 05/16/2015   HCT 45.8 05/16/2015   MCV 88.7 05/16/2015   PLT 234 05/16/2015   Lab Results  Component Value Date   CREATININE 0.9 05/16/2015   BUN 23.2 05/16/2015   NA 136 05/16/2015   K 4.2 05/16/2015   CL 104 04/18/2015   CO2 23 05/16/2015     Impression:  The patient is tolerating radiation.  Plan:  Continue treatment as planned. Discussed treatment plan, effect on singing, etc.

## 2015-05-17 NOTE — Progress Notes (Signed)
Weekly assessment of radiation to right lung.Denies pain or shortness of breath.Has mild  productive cough with some streaks of blood.Had  Weekly chemotherapy on yesterday and is doing well.

## 2015-05-18 ENCOUNTER — Emergency Department (HOSPITAL_COMMUNITY)
Admission: EM | Admit: 2015-05-18 | Discharge: 2015-05-18 | Disposition: A | Discharge status: Home / Self Care | Payer: 59 | Attending: Emergency Medicine | Admitting: Emergency Medicine

## 2015-05-18 ENCOUNTER — Encounter (HOSPITAL_COMMUNITY): Payer: Self-pay | Admitting: Emergency Medicine

## 2015-05-18 ENCOUNTER — Ambulatory Visit
Admission: RE | Admit: 2015-05-18 | Discharge: 2015-05-18 | Disposition: A | Discharge status: Home / Self Care | Payer: 59 | Source: Ambulatory Visit | Attending: Radiation Oncology | Admitting: Radiation Oncology

## 2015-05-18 ENCOUNTER — Emergency Department (HOSPITAL_COMMUNITY): Payer: 59

## 2015-05-18 ENCOUNTER — Ambulatory Visit: Payer: 59

## 2015-05-18 DIAGNOSIS — Z8709 Personal history of other diseases of the respiratory system: Secondary | ICD-10-CM | POA: Diagnosis not present

## 2015-05-18 DIAGNOSIS — R062 Wheezing: Secondary | ICD-10-CM | POA: Diagnosis not present

## 2015-05-18 DIAGNOSIS — Z85828 Personal history of other malignant neoplasm of skin: Secondary | ICD-10-CM | POA: Diagnosis not present

## 2015-05-18 DIAGNOSIS — Z720 Tobacco use: Secondary | ICD-10-CM | POA: Insufficient documentation

## 2015-05-18 DIAGNOSIS — R042 Hemoptysis: Secondary | ICD-10-CM | POA: Diagnosis not present

## 2015-05-18 DIAGNOSIS — Z510 Encounter for antineoplastic radiation therapy: Secondary | ICD-10-CM | POA: Diagnosis not present

## 2015-05-18 DIAGNOSIS — Z8719 Personal history of other diseases of the digestive system: Secondary | ICD-10-CM | POA: Diagnosis not present

## 2015-05-18 DIAGNOSIS — Z79899 Other long term (current) drug therapy: Secondary | ICD-10-CM | POA: Insufficient documentation

## 2015-05-18 LAB — CBC WITH DIFFERENTIAL/PLATELET
Basophils Absolute: 0 10*3/uL (ref 0.0–0.1)
Basophils Relative: 0 % (ref 0–1)
Eosinophils Absolute: 0.1 10*3/uL (ref 0.0–0.7)
Eosinophils Relative: 1 % (ref 0–5)
HCT: 43.1 % (ref 39.0–52.0)
Hemoglobin: 14.7 g/dL (ref 13.0–17.0)
LYMPHS ABS: 1.6 10*3/uL (ref 0.7–4.0)
Lymphocytes Relative: 21 % (ref 12–46)
MCH: 30 pg (ref 26.0–34.0)
MCHC: 34.1 g/dL (ref 30.0–36.0)
MCV: 88 fL (ref 78.0–100.0)
MONOS PCT: 5 % (ref 3–12)
Monocytes Absolute: 0.4 10*3/uL (ref 0.1–1.0)
NEUTROS ABS: 5.4 10*3/uL (ref 1.7–7.7)
Neutrophils Relative %: 73 % (ref 43–77)
Platelets: 220 10*3/uL (ref 150–400)
RBC: 4.9 MIL/uL (ref 4.22–5.81)
RDW: 14 % (ref 11.5–15.5)
WBC: 7.4 10*3/uL (ref 4.0–10.5)

## 2015-05-18 LAB — COMPREHENSIVE METABOLIC PANEL
ALT: 20 U/L (ref 17–63)
ANION GAP: 8 (ref 5–15)
AST: 26 U/L (ref 15–41)
Albumin: 4.4 g/dL (ref 3.5–5.0)
Alkaline Phosphatase: 67 U/L (ref 38–126)
BUN: 21 mg/dL — ABNORMAL HIGH (ref 6–20)
CHLORIDE: 104 mmol/L (ref 101–111)
CO2: 24 mmol/L (ref 22–32)
Calcium: 9.1 mg/dL (ref 8.9–10.3)
Creatinine, Ser: 0.97 mg/dL (ref 0.61–1.24)
GFR calc Af Amer: >60 mL/min (ref >60)
GFR calc non Af Amer: >60 mL/min (ref >60)
GLUCOSE: 125 mg/dL — AB (ref 65–99)
POTASSIUM: 4 mmol/L (ref 3.5–5.1)
Sodium: 136 mmol/L (ref 135–145)
Total Bilirubin: 0.6 mg/dL (ref 0.3–1.2)
Total Protein: 7.4 g/dL (ref 6.5–8.1)

## 2015-05-18 LAB — I-STAT TROPONIN, ED: TROPONIN I, POC: 0.01 ng/mL (ref 0.00–0.08)

## 2015-05-18 MED ORDER — ALBUTEROL SULFATE HFA 108 (90 BASE) MCG/ACT IN AERS: 1.0000 | INHALATION_SPRAY | Freq: Four times a day (QID) | RESPIRATORY_TRACT | Status: DC | PRN

## 2015-05-18 MED ORDER — IPRATROPIUM-ALBUTEROL 0.5-2.5 (3) MG/3ML IN SOLN
3.0000 mL | Freq: Once | RESPIRATORY_TRACT | Status: AC
  Administered 2015-05-18: 3 mL via RESPIRATORY_TRACT
  Filled 2015-05-18: qty 3

## 2015-05-18 MED ORDER — SODIUM CHLORIDE 0.9 % IV BOLUS (SEPSIS)
1000.0000 mL | Freq: Once | INTRAVENOUS | Status: AC
  Administered 2015-05-18: 1000 mL via INTRAVENOUS

## 2015-05-18 NOTE — ED Provider Notes (Signed)
CSN: [id number]     Arrival date & time 05/18/15  2112 History   First MD Initiated Contact with Patient 05/18/15 2115     Chief Complaint  Patient presents with  . Hemoptysis  . chemo patient      (Consider location/radiation/quality/duration/timing/severity/associated sxs/prior Treatment) The history is provided by the patient.  Gabriel [name] is a 74 y.o. male hx of nonsmall cell cancer on chemo (Monday) and radiation (today), here with hemoptysis.  Patient has been having nonproductive cough for several days. Patient states that he coughed up blood tinged sputum today. He coughed up 3 episodes of blood in his sputum, about 1 teaspoon each time. Denies fever or chills. Called oncology and sent for evaluation.    Past Medical History  Diagnosis Date  . Spontaneous pneumothorax     HISTORY OF (ONLY)  . Cancer     skin cancer  . Lung mass     right  . Inguinal hernia    Past Surgical History  Procedure Laterality Date  . Hernia repair  2011  . Colonoscopy w/ biopsies and polypectomy    . Wisdom tooth extraction    . Tonsillectomy    . Video bronchoscopy with endobronchial ultrasound N/A 04/18/2015    Procedure: VIDEO BRONCHOSCOPY WITH ENDOBRONCHIAL ULTRASOUND;  Surgeon: Grace Isaac, MD;  Location: [facility] OR;  Service: Thoracic;  Laterality: N/A;   Family History  Problem Relation Age of Onset  . Heart attack Father   . Hypertension Mother   . Heart disease Father    History  Substance Use Topics  . Smoking status: Current Every Day Smoker -- 0.50 packs/day for 55 years    Types: Cigarettes  . Smokeless tobacco: Never Used  . Alcohol Use: Yes     Comment: every other night -social    Review of Systems  Respiratory: Positive for cough.   All other systems reviewed and are negative.     Allergies  Aleve  Home Medications   Prior to Admission medications   Medication Sig Start Date End Date Taking? Authorizing Provider  Ascorbic Acid (VITAMIN C) 1000 MG  tablet Take 1,000 mg by mouth daily.   Yes Historical Provider, MD  b complex vitamins tablet Take 1 tablet by mouth daily.   Yes Historical Provider, MD  calcium-vitamin D (OSCAL WITH D) 500-200 MG-UNIT per tablet Take 1 tablet by mouth daily.   Yes Historical Provider, MD  Cyanocobalamin (VITAMIN B 12 PO) Take 1 tablet by mouth daily.   Yes Historical Provider, MD  L-Arginine 1000 MG TABS Take 1,000 mg by mouth daily.   Yes Historical Provider, MD  magnesium 30 MG tablet Take 30 mg by mouth daily.   Yes Historical Provider, MD  Multiple Vitamin (MULTI VITAMIN DAILY PO) Take 1 tablet by mouth daily.   Yes Historical Provider, MD  Omega-3 Fatty Acids (FISH OIL) 1000 MG CAPS Take 1,000 mg by mouth daily.   Yes Historical Provider, MD  potassium chloride (K-DUR) 10 MEQ tablet Take 10 mEq by mouth daily.   Yes Historical Provider, MD  prochlorperazine (COMPAZINE) 10 MG tablet Take 1 tablet (10 mg total) by mouth every 6 (six) hours as needed for nausea or vomiting. 04/21/15  Yes Curt Bears, MD  vitamin A 10000 UNIT capsule Take 10,000 Units by mouth daily.   Yes Historical Provider, MD   BP 177/91 mmHg  Pulse 87  Temp(Src) 98.2 F (36.8 C) (Oral)  Resp 24  SpO2 98% Physical Exam  Constitutional: He is oriented to person, place, and time. He appears well-nourished.  Chronically ill   HENT:  Head: Normocephalic.  Mouth/Throat: Oropharynx is clear and moist.  Eyes: Pupils are equal, round, and reactive to light.  Neck: Normal range of motion. Neck supple.  Cardiovascular: Normal rate, regular rhythm and normal heart sounds.   Pulmonary/Chest: Effort normal and breath sounds normal.  Minimal wheezing throughout. Diminished R side   Abdominal: Soft. Bowel sounds are normal. He exhibits no distension. There is no tenderness. There is no rebound.  Musculoskeletal: Normal range of motion. He exhibits no edema or tenderness.  Neurological: He is alert and oriented to person, place, and time.  No cranial nerve deficit. Coordination normal.  Skin: Skin is warm and dry.  Psychiatric: He has a normal mood and affect. His behavior is normal. Judgment and thought content normal.  Nursing note and vitals reviewed.   ED Course  Procedures (including critical care time) Labs Review Labs Reviewed  COMPREHENSIVE METABOLIC PANEL - Abnormal; Notable for the following:    Glucose, Bld 125 (*)    BUN 21 (*)    All other components within normal limits  CBC WITH DIFFERENTIAL/PLATELET  Randolm Idol, ED    Imaging Review Dg Chest 2 View  05/18/2015   CLINICAL DATA:  74 year old male with hemoptysis  EXAM: CHEST  2 VIEW  COMPARISON:  Chest radiograph dated 04/18/2015 and CT dated 04/05/2015  FINDINGS: Two views of the chest demonstrate emphysematous changes of the lungs. There is no focal consolidation, pleural effusion, or pneumothorax. Right perihilar linear atelectasis/ scarring. The cardiomediastinal silhouette is within normal limits. The osseous structures are grossly unremarkable.  IMPRESSION: Emphysema with right lung linear atelectasis/ scarring. No focal consolidation.   Electronically Signed   By: Anner Crete M.D.   On: 05/18/2015 22:22     EKG Interpretation None      MDM   Final diagnoses:  None    Gabriel [name] is a 74 y.o. male here with hemoptysis after radiation. Will get labs, CXR. Vitals stable, I doubt PE.   11:05 PM Hg stable. CXR showed no infiltrate. Discussed with Dr. Inda Merlin, who agrees with discharge. Will dc home with albuterol for possible bronchitis.      Wandra Arthurs, MD 05/18/15 [phone]

## 2015-05-18 NOTE — ED Notes (Signed)
Pt from home c/o hemoptysis. Pt being treated for lung CA last treatment was today at 1315. Denies fever or pain. Pt talked to Oncologist Lance Morin about "considerable amount of blood in phlegm".

## 2015-05-18 NOTE — Discharge Instructions (Signed)
Use albuterol as needed for cough.   Go to radiation tomorrow.   See your oncologist.  Return to ER if you have worse cough with blood, vomiting, fevers, trouble breathing.

## 2015-05-19 ENCOUNTER — Ambulatory Visit: Payer: 59

## 2015-05-19 ENCOUNTER — Ambulatory Visit
Admission: RE | Admit: 2015-05-19 | Discharge: 2015-05-19 | Disposition: A | Discharge status: Home / Self Care | Payer: 59 | Source: Ambulatory Visit | Attending: Radiation Oncology | Admitting: Radiation Oncology

## 2015-05-19 DIAGNOSIS — Z510 Encounter for antineoplastic radiation therapy: Secondary | ICD-10-CM | POA: Diagnosis not present

## 2015-05-20 ENCOUNTER — Ambulatory Visit: Payer: 59

## 2015-05-20 ENCOUNTER — Ambulatory Visit
Admission: RE | Admit: 2015-05-20 | Discharge: 2015-05-20 | Disposition: A | Discharge status: Home / Self Care | Payer: 59 | Source: Ambulatory Visit | Attending: Radiation Oncology | Admitting: Radiation Oncology

## 2015-05-20 DIAGNOSIS — Z510 Encounter for antineoplastic radiation therapy: Secondary | ICD-10-CM | POA: Diagnosis not present

## 2015-05-23 ENCOUNTER — Ambulatory Visit
Admission: RE | Admit: 2015-05-23 | Discharge: 2015-05-23 | Disposition: A | Discharge status: Home / Self Care | Payer: 59 | Source: Ambulatory Visit | Attending: Radiation Oncology | Admitting: Radiation Oncology

## 2015-05-23 ENCOUNTER — Ambulatory Visit (HOSPITAL_BASED_OUTPATIENT_CLINIC_OR_DEPARTMENT_OTHER): Payer: 59 | Admitting: Oncology

## 2015-05-23 ENCOUNTER — Ambulatory Visit (HOSPITAL_BASED_OUTPATIENT_CLINIC_OR_DEPARTMENT_OTHER): Payer: 59

## 2015-05-23 ENCOUNTER — Other Ambulatory Visit (HOSPITAL_BASED_OUTPATIENT_CLINIC_OR_DEPARTMENT_OTHER): Payer: 59

## 2015-05-23 ENCOUNTER — Ambulatory Visit: Payer: 59

## 2015-05-23 ENCOUNTER — Encounter: Payer: Self-pay | Admitting: *Deleted

## 2015-05-23 ENCOUNTER — Encounter: Payer: Self-pay | Admitting: Oncology

## 2015-05-23 VITALS — BP 155/62 | HR 98 | Temp 97.5°F | Resp 18 | Ht 70.0 in | Wt 138.7 lb

## 2015-05-23 DIAGNOSIS — C3431 Malignant neoplasm of lower lobe, right bronchus or lung: Secondary | ICD-10-CM | POA: Diagnosis not present

## 2015-05-23 DIAGNOSIS — C3491 Malignant neoplasm of unspecified part of right bronchus or lung: Secondary | ICD-10-CM

## 2015-05-23 DIAGNOSIS — Z5111 Encounter for antineoplastic chemotherapy: Secondary | ICD-10-CM

## 2015-05-23 DIAGNOSIS — C342 Malignant neoplasm of middle lobe, bronchus or lung: Secondary | ICD-10-CM

## 2015-05-23 DIAGNOSIS — Z510 Encounter for antineoplastic radiation therapy: Secondary | ICD-10-CM | POA: Diagnosis not present

## 2015-05-23 DIAGNOSIS — R0609 Other forms of dyspnea: Secondary | ICD-10-CM

## 2015-05-23 DIAGNOSIS — R05 Cough: Secondary | ICD-10-CM

## 2015-05-23 LAB — CBC WITH DIFFERENTIAL/PLATELET
BASO%: 0.1 % (ref 0.0–2.0)
BASOS ABS: 0 10*3/uL (ref 0.0–0.1)
EOS%: 1 % (ref 0.0–7.0)
Eosinophils Absolute: 0.1 10*3/uL (ref 0.0–0.5)
HEMATOCRIT: 43.7 % (ref 38.4–49.9)
HGB: 15.1 g/dL (ref 13.0–17.1)
LYMPH#: 1 10*3/uL (ref 0.9–3.3)
LYMPH%: 14.9 % (ref 14.0–49.0)
MCH: 30.3 pg (ref 27.2–33.4)
MCHC: 34.6 g/dL (ref 32.0–36.0)
MCV: 87.8 fL (ref 79.3–98.0)
MONO#: 0.5 10*3/uL (ref 0.1–0.9)
MONO%: 7.2 % (ref 0.0–14.0)
NEUT%: 76.8 % — AB (ref 39.0–75.0)
NEUTROS ABS: 5.1 10*3/uL (ref 1.5–6.5)
Platelets: 203 10*3/uL (ref 140–400)
RBC: 4.98 10*6/uL (ref 4.20–5.82)
RDW: 14.1 % (ref 11.0–14.6)
WBC: 6.7 10*3/uL (ref 4.0–10.3)

## 2015-05-23 LAB — COMPREHENSIVE METABOLIC PANEL (CC13)
ALBUMIN: 4 g/dL (ref 3.5–5.0)
ALK PHOS: 77 U/L (ref 40–150)
ALT: 17 U/L (ref 0–55)
AST: 18 U/L (ref 5–34)
Anion Gap: 9 mEq/L (ref 3–11)
BUN: 20.3 mg/dL (ref 7.0–26.0)
CO2: 22 mEq/L (ref 22–29)
CREATININE: 0.9 mg/dL (ref 0.7–1.3)
Calcium: 9.7 mg/dL (ref 8.4–10.4)
Chloride: 108 mEq/L (ref 98–109)
EGFR: 85 mL/min/{1.73_m2} — ABNORMAL LOW (ref >90)
Glucose: 105 mg/dl (ref 70–140)
POTASSIUM: 4.3 meq/L (ref 3.5–5.1)
SODIUM: 139 meq/L (ref 136–145)
Total Bilirubin: 0.4 mg/dL (ref 0.20–1.20)
Total Protein: 6.9 g/dL (ref 6.4–8.3)

## 2015-05-23 MED ORDER — FAMOTIDINE IN NACL 20-0.9 MG/50ML-% IV SOLN
20.0000 mg | Freq: Once | INTRAVENOUS | Status: AC
  Administered 2015-05-23: 20 mg via INTRAVENOUS

## 2015-05-23 MED ORDER — SODIUM CHLORIDE 0.9 % IV SOLN
Freq: Once | INTRAVENOUS | Status: AC
  Administered 2015-05-23: 14:00:00 via INTRAVENOUS

## 2015-05-23 MED ORDER — FAMOTIDINE IN NACL 20-0.9 MG/50ML-% IV SOLN
INTRAVENOUS | Status: AC
  Filled 2015-05-23: qty 50

## 2015-05-23 MED ORDER — SODIUM CHLORIDE 0.9 % IV SOLN
Freq: Once | INTRAVENOUS | Status: AC
  Administered 2015-05-23: 15:00:00 via INTRAVENOUS
  Filled 2015-05-23: qty 8

## 2015-05-23 MED ORDER — PACLITAXEL CHEMO INJECTION 300 MG/50ML
45.0000 mg/m2 | Freq: Once | INTRAVENOUS | Status: AC
  Administered 2015-05-23: 78 mg via INTRAVENOUS
  Filled 2015-05-23: qty 13

## 2015-05-23 MED ORDER — DIPHENHYDRAMINE HCL 50 MG/ML IJ SOLN
50.0000 mg | Freq: Once | INTRAMUSCULAR | Status: AC
  Administered 2015-05-23: 50 mg via INTRAVENOUS

## 2015-05-23 MED ORDER — DIPHENHYDRAMINE HCL 50 MG/ML IJ SOLN
INTRAMUSCULAR | Status: AC
  Filled 2015-05-23: qty 1

## 2015-05-23 MED ORDER — SODIUM CHLORIDE 0.9 % IV SOLN
163.8000 mg | Freq: Once | INTRAVENOUS | Status: AC
  Administered 2015-05-23: 160 mg via INTRAVENOUS
  Filled 2015-05-23: qty 16

## 2015-05-23 NOTE — Progress Notes (Signed)
No images are attached to the encounter. No scans are attached to the encounter. No scans are attached to the encounter. [facility] SHARED VISIT PROGRESS NOTE  No PCP Per Patient No address on file  DIAGNOSIS: Non-small cell carcinoma of right lung, stage 3   Staging form: Lung, AJCC 7th Edition     Clinical stage from 04/21/2015: Stage IIIA (T2a, N2, M0) - Signed by Curt Bears, MD on 04/21/2015  [facility] One molecular studies reveal that he was positive for [place], [place], and STAG2 [id number]. He was negative for: EGFR, ALK, BRAF, MET, RET, ERBB2 and ROS1  PRIOR THERAPY: none  CURRENT THERAPY: Concurrent chemoradiation with chemotherapy in the form of weekly carboplatin for AUC 2 paclitaxel 45 mg/m given concurrent with radiation therapy. First cycle started 05/09/2015  INTERVAL HISTORY: Gabriel CUMMISKEY74y.o. male returns for a scheduled regular office visit for followup of his recently diagnosed stage IIIa non-small cell lung cancer. He presents to receive his third cycle of  weekly chemotherapy with carboplatin and paclitaxel. Since his last visit, he was seen in the ER for hemoptysis. Hgb was stable and CXR was negative for infiltrate. He was discharged home with an inhaler which he indicates that he doesn't really need. Today he feels fine, denies any fever, chills, cough, shortness of breath. Hemoptysis is significantly improved. States sputum is only slightly blood-tinged. He denied any nausea vomiting, diarrhea or constipation.  MEDICAL HISTORY: Past Medical History  Diagnosis Date  . Spontaneous pneumothorax     HISTORY OF (ONLY)  . Cancer     skin cancer  . Lung mass     right  . Inguinal hernia     ALLERGIES:  is allergic to aleve.  MEDICATIONS:  Current Outpatient Prescriptions  Medication Sig Dispense Refill  . albuterol (PROVENTIL HFA;VENTOLIN HFA) 108 (90 BASE) MCG/ACT inhaler Inhale 1-2 puffs into the lungs every 6 (six)  hours as needed for wheezing or shortness of breath. 1 Inhaler 0  . Ascorbic Acid (VITAMIN C) 1000 MG tablet Take 1,000 mg by mouth daily.    .Marland Kitchenb complex vitamins tablet Take 1 tablet by mouth daily.    . calcium-vitamin D (OSCAL WITH D) 500-200 MG-UNIT per tablet Take 1 tablet by mouth daily.    . Cyanocobalamin (VITAMIN B 12 PO) Take 1 tablet by mouth daily.    .Marland KitchenL-Arginine 1000 MG TABS Take 1,000 mg by mouth daily.    . magnesium 30 MG tablet Take 30 mg by mouth daily.    . Multiple Vitamin (MULTI VITAMIN DAILY PO) Take 1 tablet by mouth daily.    . Omega-3 Fatty Acids (FISH OIL) 1000 MG CAPS Take 1,000 mg by mouth daily.    . potassium chloride (K-DUR) 10 MEQ tablet Take 10 mEq by mouth daily.    . prochlorperazine (COMPAZINE) 10 MG tablet Take 1 tablet (10 mg total) by mouth every 6 (six) hours as needed for nausea or vomiting. 30 tablet 0  . vitamin A 10000 UNIT capsule Take 10,000 Units by mouth daily.     No current facility-administered medications for this visit.    SURGICAL HISTORY:  Past Surgical History  Procedure Laterality Date  . Hernia repair  2011  . Colonoscopy w/ biopsies and polypectomy    . Wisdom tooth extraction    . Tonsillectomy    . Video bronchoscopy with endobronchial ultrasound N/A 04/18/2015    Procedure: VIDEO BRONCHOSCOPY WITH ENDOBRONCHIAL ULTRASOUND;  Surgeon: EPercell Miller  Maryruth Bun, MD;  Location: [facility];  Service: Thoracic;  Laterality: N/A;    REVIEW OF SYSTEMS:  Review of Systems  Constitutional: Negative.  Negative for fever, chills, weight loss, malaise/fatigue and diaphoresis.  HENT: Negative.  Negative for congestion, ear discharge, ear pain, hearing loss, nosebleeds, sore throat and tinnitus.        Patient has minor tooth pain and will contact his dentist for evaluation and treatment  Eyes: Negative.  Negative for blurred vision, double vision, photophobia, pain, discharge and redness.  Respiratory: Positive for hemoptysis. Negative for cough,  sputum production, shortness of breath, wheezing and stridor.        Slightly blood-tinged sputum.   Cardiovascular: Negative.  Negative for chest pain, palpitations, orthopnea, claudication, leg swelling and PND.  Gastrointestinal: Negative.  Negative for heartburn, nausea, vomiting, abdominal pain, diarrhea, constipation, blood in stool and melena.  Genitourinary: Negative.   Musculoskeletal: Negative.   Skin: Negative.   Neurological: Negative.  Negative for dizziness, tingling, focal weakness, seizures, weakness and headaches.  Endo/Heme/Allergies: Negative.  Does not bruise/bleed easily.  Psychiatric/Behavioral: Negative.  Negative for depression. The patient is not nervous/anxious and does not have insomnia.      PHYSICAL EXAMINATION: Physical Exam  Constitutional: He is oriented to person, place, and time and well-developed, well-nourished, and in no distress.  HENT:  Head: Normocephalic and atraumatic.  Mouth/Throat: Oropharynx is clear and moist.  Eyes: Pupils are equal, round, and reactive to light.  Neck: Normal range of motion. Neck supple. No JVD present. No tracheal deviation present. No thyromegaly present.  Cardiovascular: Normal rate, regular rhythm, normal heart sounds and intact distal pulses.  Exam reveals no gallop and no friction rub.   No murmur heard. Pulmonary/Chest: Effort normal and breath sounds normal. No respiratory distress. He has no wheezes. He has no rales.  Abdominal: Soft. Bowel sounds are normal. He exhibits no distension and no mass. There is no tenderness.  Musculoskeletal: Normal range of motion. He exhibits no edema or tenderness.  Lymphadenopathy:    He has no cervical adenopathy.  Neurological: He is alert and oriented to person, place, and time. He has normal reflexes. Gait normal.  Skin: Skin is warm and dry. No rash noted.    ECOG PERFORMANCE STATUS: 0 - Asymptomatic  Blood pressure 155/62, pulse 98, temperature 97.5 F (36.4 C),  temperature source Oral, resp. rate 18, height 5' 10"  (1.778 m), weight 138 lb 11.2 oz (62.914 kg), SpO2 98 %.  LABORATORY DATA: Lab Results  Component Value Date   WBC 6.7 05/23/2015   HGB 15.1 05/23/2015   HCT 43.7 05/23/2015   MCV 87.8 05/23/2015   PLT 203 05/23/2015      Chemistry      Component Value Date/Time   NA 139 05/23/2015 1246   NA 136 05/18/2015 2136   K 4.3 05/23/2015 1246   K 4.0 05/18/2015 2136   CL 104 05/18/2015 2136   CO2 22 05/23/2015 1246   CO2 24 05/18/2015 2136   BUN 20.3 05/23/2015 1246   BUN 21* 05/18/2015 2136   CREATININE 0.9 05/23/2015 1246   CREATININE 0.97 05/18/2015 2136      Component Value Date/Time   CALCIUM 9.7 05/23/2015 1246   CALCIUM 9.1 05/18/2015 2136   ALKPHOS 77 05/23/2015 1246   ALKPHOS 67 05/18/2015 2136   AST 18 05/23/2015 1246   AST 26 05/18/2015 2136   ALT 17 05/23/2015 1246   ALT 20 05/18/2015 2136   BILITOT 0.40 05/23/2015  1246   BILITOT 0.6 05/18/2015 2136       RADIOGRAPHIC STUDIES:  Dg Chest 2 View  05/18/2015   CLINICAL DATA:  74 year old male with hemoptysis  EXAM: CHEST  2 VIEW  COMPARISON:  Chest radiograph dated 04/18/2015 and CT dated 04/05/2015  FINDINGS: Two views of the chest demonstrate emphysematous changes of the lungs. There is no focal consolidation, pleural effusion, or pneumothorax. Right perihilar linear atelectasis/ scarring. The cardiomediastinal silhouette is within normal limits. The osseous structures are grossly unremarkable.  IMPRESSION: Emphysema with right lung linear atelectasis/ scarring. No focal consolidation.   Electronically Signed   By: Anner Crete M.D.   On: 05/18/2015 22:22   Mr Jeri Cos FY Contrast  05/04/2015   CLINICAL DATA:  Lung cancer.  Staging.  EXAM: MRI HEAD WITHOUT AND WITH CONTRAST  TECHNIQUE: Multiplanar, multiecho pulse sequences of the brain and surrounding structures were obtained without and with intravenous contrast.  CONTRAST:  25m MULTIHANCE GADOBENATE  DIMEGLUMINE 529 MG/ML IV SOLN  COMPARISON:  PET scan 04/12/2015.  FINDINGS: No evidence for acute infarction, hemorrhage, mass lesion, hydrocephalus, or extra-axial fluid. Moderate atrophy not unexpected for age. Mild subcortical and periventricular T2 and FLAIR hyperintensities, likely chronic microvascular ischemic change. Flow voids are maintained throughout the carotid, basilar, and vertebral arteries. There are no areas of chronic hemorrhage. Pituitary, pineal, and cerebellar tonsils unremarkable.  Focal area of loss of the normal marrow signal in the clivus seen best on image 13 series 3, 4 x 6 mm, does show postcontrast enhancement. Skull base metastasis could have this appearance. This area is incompletely evaluated on recent PET scan but not clearly abnormal on that study. Continued short-term surveillance warranted. No upper cervical or calvarial osseous metastases elsewhere.  Post infusion, no abnormal enhancement of the brain or meninges. Incidental venous angioma LEFT frontal white matter. Major dural venous sinuses are patent. Extracranial soft tissues are unremarkable.  IMPRESSION: Moderate atrophy with mild small vessel disease.  No intracranial metastatic disease is evident.  Focal area of marrow heterogeneity within the clivus, subcentimeter size, is indeterminate but short-term follow-up recommended as osseous metastatic disease could have this appearance.   Electronically Signed   By: JStaci RighterM.D.   On: 05/04/2015 21:02     ASSESSMENT/PLAN:  No problem-specific assessment & plan notes found for this encounter. Mr. OGoodallis a very pleasant 74year old Caucasian male recently diagnosed with Stage IIIA (T2a, N2, M0) non-small cell lung cancer, adenocarcinoma. He presents for his third weekly cycle of concurrent chemoradiation with chemotherapy consisting of carboplatin for an AUC of 2 and paclitaxel 45 mg/m.    Patient was seen and discussed with Dr. MJulien Nordmann Recommend that he  proceed with cycle 3 today as scheduled. He was advised that he would need to be evaluated in the ER if he developed any further significant hemoptysis. He will follow-up in 2 weeks for symptom management visit.  All questions were answered. The patient knows to call the clinic with any problems, questions or concerns. We can certainly see the patient much sooner if necessary.  CMikey Bussing DNP, AGPCNP-BC, AOCNP 05/23/2015  ADDENDUM: Hematology/Oncology Attending: I had a face to face encounter with the patient today. I recommended his care plan. This is a very pleasant 74years old white male with a stage IIIa non-small cell lung cancer currently undergoing concurrent chemoradiation with weekly carboplatin and paclitaxel is status post 2 cycles and tolerating his treatment fairly well. The patient was seen at the emergency department  recently complaining of mild hemoptysis but this improved over the last few days. He denied having any significant chest pain but has shortness breath with exertion and mild cough. He denied having any significant weight loss or night sweats. He has no nausea or vomiting. I recommended for the patient to proceed with cycle #3 today as a scheduled. He would come back for follow-up visit in 2 weeks for reevaluation and management of any adverse effect of his treatment. The patient was advised to call immediately if he has any concerning symptoms in the interval.  Disclaimer: This note was dictated with voice recognition software. Similar sounding words can inadvertently be transcribed and may not be corrected upon review.  Eilleen Kempf., MD 05/23/2015

## 2015-05-23 NOTE — Progress Notes (Signed)
Oncology Nurse Navigator Documentation  Oncology Nurse Navigator Flowsheets 05/23/2015  Navigator Encounter Type Treatment/spoke with patient today at [facility].  He is receiving treatment.  No issues or concerns with treatment at this time.  No barriers identified at this time.    Patient Visit Type Medonc  Treatment Phase Treatment  Barriers/Navigation Needs No barriers at this time  Interventions Other  Time Spent with Patient 15

## 2015-05-23 NOTE — Patient Instructions (Signed)
[facility] Discharge Instructions for Patients Receiving Chemotherapy  Today you received the following chemotherapy agents Taxol and Carboplatin.  To help prevent nausea and vomiting after your treatment, we encourage you to take your nausea medication as prescribed.   If you develop nausea and vomiting that is not controlled by your nausea medication, call the clinic.   BELOW ARE SYMPTOMS THAT SHOULD BE REPORTED IMMEDIATELY:  *FEVER GREATER THAN 100.5 F  *CHILLS WITH OR WITHOUT FEVER  NAUSEA AND VOMITING THAT IS NOT CONTROLLED WITH YOUR NAUSEA MEDICATION  *UNUSUAL SHORTNESS OF BREATH  *UNUSUAL BRUISING OR BLEEDING  TENDERNESS IN MOUTH AND THROAT WITH OR WITHOUT PRESENCE OF ULCERS  *URINARY PROBLEMS  *BOWEL PROBLEMS  UNUSUAL RASH Items with * indicate a potential emergency and should be followed up as soon as possible.  Feel free to call the clinic you have any questions or concerns. The clinic phone number is [phone].  Please show the CHEMO ALERT CARD at check-in to the Emergency Department and triage nurse.   

## 2015-05-24 ENCOUNTER — Ambulatory Visit
Admission: RE | Admit: 2015-05-24 | Discharge: 2015-05-24 | Disposition: A | Discharge status: Home / Self Care | Payer: 59 | Source: Ambulatory Visit | Attending: Radiation Oncology | Admitting: Radiation Oncology

## 2015-05-24 ENCOUNTER — Ambulatory Visit: Payer: 59

## 2015-05-24 VITALS — BP 141/79 | HR 96 | Temp 98.3°F | Resp 20 | Wt 140.6 lb

## 2015-05-24 DIAGNOSIS — Z510 Encounter for antineoplastic radiation therapy: Secondary | ICD-10-CM | POA: Diagnosis not present

## 2015-05-24 DIAGNOSIS — C3491 Malignant neoplasm of unspecified part of right bronchus or lung: Secondary | ICD-10-CM

## 2015-05-24 NOTE — Progress Notes (Signed)
Weekly assessment of radiation to right lung.Completed 10 of 30 fractions.Denies pain or shortness of breath.No cough over last couple of days.Will start amoxicillin tonight for abscessed tooth.Moderate fatigue.Weekly chemotherapy on Monday.No skin changes per patient.

## 2015-05-24 NOTE — Progress Notes (Signed)
  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  Current Dose: 20 Gy     Planned Dose:  60 Gy  Narrative . . . . . . . . The patient presents for routine under treatment assessment.                                   The patient is without complaint. Denies pain or shortness of breath. No cough over last couple of days. Will start amoxicillin tonight for abscessed tooth. Moderate fatigue. Weekly chemotherapy on Monday. No skin changes per patient. Has an albuterol inhaler that he does not currently use.                                  Set-up films were reviewed.                                 The chart was checked. Physical Findings. . .  weight is 140 lb 9.6 oz (63.776 kg). His temperature is 98.3 F (36.8 C). His blood pressure is 141/79 and his pulse is 96. His respiration is 20 and oxygen saturation is 98%. . Weight essentially stable.  No significant changes. Wheezing heard on right side. Heart has regular rhythm and rate. Impression . . . . . . . The patient is tolerating radiation. Plan . . . . . . . . . . . . Continue treatment as planned. He does not need any refills today.  This document serves as a record of services personally performed by Gery Pray, MD. It was created on his behalf by Arlyce Harman, a trained medical scribe. The creation of this record is based on the scribe's personal observations and the provider's statements to them. This document has been checked and approved by the attending provider. ________________________________   Blair Promise, PhD, MD

## 2015-05-25 ENCOUNTER — Ambulatory Visit: Payer: 59

## 2015-05-25 ENCOUNTER — Ambulatory Visit
Admission: RE | Admit: 2015-05-25 | Discharge: 2015-05-25 | Disposition: A | Discharge status: Home / Self Care | Payer: 59 | Source: Ambulatory Visit | Attending: Radiation Oncology | Admitting: Radiation Oncology

## 2015-05-25 DIAGNOSIS — Z510 Encounter for antineoplastic radiation therapy: Secondary | ICD-10-CM | POA: Diagnosis not present

## 2015-05-26 ENCOUNTER — Ambulatory Visit
Admission: RE | Admit: 2015-05-26 | Discharge: 2015-05-26 | Disposition: A | Discharge status: Home / Self Care | Payer: 59 | Source: Ambulatory Visit | Attending: Radiation Oncology | Admitting: Radiation Oncology

## 2015-05-26 ENCOUNTER — Ambulatory Visit: Payer: 59 | Admitting: Nutrition

## 2015-05-26 ENCOUNTER — Ambulatory Visit: Payer: 59

## 2015-05-26 DIAGNOSIS — Z510 Encounter for antineoplastic radiation therapy: Secondary | ICD-10-CM | POA: Diagnosis not present

## 2015-05-26 NOTE — Progress Notes (Signed)
74 year old male diagnosed with non-small cell lung cancer.  Past medical history includes spontaneous pneumothorax and hernia.  Medications include vitamin C, B complex vitamin, B12, calcium with vitamin D, L-arginine, magnesium, multivitamin, omega-3 fatty acids, Compazine, and vitamin A.  Labs were reviewed.  Height: 5 feet 10 inches. Weight: 140.6 pounds Usual body weight: 140-145 pounds. BMI: 20.17.  Patient currently maintaining weight and eating well. Reports his appetite is fair.  He makes himself eat. Dietary recall reveals patient does consume 3 meals daily with approximately 95 g of protein daily. Patient drinks a shake every morning with milk, Carnation breakfast essentials and one scoop of whey protein powder. Patient denies current nutrition impact symptoms.  Nutrition diagnosis: Food and nutrition related knowledge deficit related to lung cancer and associated treatments as evidenced by no prior need for nutrition related information.  Intervention: Educated patient on sources of calories and protein and encouraged continued intake of current meals.   Recommended patient could add bedtime snack if weight maintenance becomes an issue. Patient will continue Carnation breakfast essentials daily.  Provided samples and coupons. Educated patient on strategies for difficulty swallowing and provided a fact sheet Questions were answered.  Teach back method used.  I provided contact information.  Monitoring, evaluation, goals: Patient will tolerate adequate calories and protein for weight maintenance.  Next visit: Patient agrees to contact me for questions or concerns.  **Disclaimer: This note was dictated with voice recognition software. Similar sounding words can inadvertently be transcribed and this note may contain transcription errors which may not have been corrected upon publication of note.**

## 2015-05-27 ENCOUNTER — Ambulatory Visit
Admission: RE | Admit: 2015-05-27 | Discharge: 2015-05-27 | Disposition: A | Discharge status: Home / Self Care | Payer: 59 | Source: Ambulatory Visit | Attending: Radiation Oncology | Admitting: Radiation Oncology

## 2015-05-27 ENCOUNTER — Ambulatory Visit: Payer: 59

## 2015-05-27 DIAGNOSIS — Z510 Encounter for antineoplastic radiation therapy: Secondary | ICD-10-CM | POA: Diagnosis not present

## 2015-05-30 ENCOUNTER — Other Ambulatory Visit (HOSPITAL_BASED_OUTPATIENT_CLINIC_OR_DEPARTMENT_OTHER): Payer: 59

## 2015-05-30 ENCOUNTER — Ambulatory Visit (HOSPITAL_BASED_OUTPATIENT_CLINIC_OR_DEPARTMENT_OTHER): Payer: 59

## 2015-05-30 ENCOUNTER — Ambulatory Visit
Admission: RE | Admit: 2015-05-30 | Discharge: 2015-05-30 | Disposition: A | Discharge status: Home / Self Care | Payer: 59 | Source: Ambulatory Visit | Attending: Radiation Oncology | Admitting: Radiation Oncology

## 2015-05-30 ENCOUNTER — Ambulatory Visit: Payer: 59

## 2015-05-30 VITALS — BP 155/77 | HR 101 | Temp 98.2°F | Resp 18

## 2015-05-30 DIAGNOSIS — Z510 Encounter for antineoplastic radiation therapy: Secondary | ICD-10-CM | POA: Diagnosis not present

## 2015-05-30 DIAGNOSIS — C3431 Malignant neoplasm of lower lobe, right bronchus or lung: Secondary | ICD-10-CM

## 2015-05-30 DIAGNOSIS — Z5111 Encounter for antineoplastic chemotherapy: Secondary | ICD-10-CM | POA: Diagnosis not present

## 2015-05-30 DIAGNOSIS — C342 Malignant neoplasm of middle lobe, bronchus or lung: Secondary | ICD-10-CM | POA: Diagnosis not present

## 2015-05-30 DIAGNOSIS — C3491 Malignant neoplasm of unspecified part of right bronchus or lung: Secondary | ICD-10-CM

## 2015-05-30 LAB — COMPREHENSIVE METABOLIC PANEL (CC13)
ALK PHOS: 75 U/L (ref 40–150)
ALT: 19 U/L (ref 0–55)
ANION GAP: 8 meq/L (ref 3–11)
AST: 20 U/L (ref 5–34)
Albumin: 3.9 g/dL (ref 3.5–5.0)
BILIRUBIN TOTAL: 0.37 mg/dL (ref 0.20–1.20)
BUN: 26.3 mg/dL — ABNORMAL HIGH (ref 7.0–26.0)
CO2: 26 mEq/L (ref 22–29)
Calcium: 9.6 mg/dL (ref 8.4–10.4)
Chloride: 107 mEq/L (ref 98–109)
Creatinine: 0.9 mg/dL (ref 0.7–1.3)
EGFR: 85 mL/min/{1.73_m2} — ABNORMAL LOW (ref >90)
Glucose: 123 mg/dl (ref 70–140)
Potassium: 4 mEq/L (ref 3.5–5.1)
Sodium: 140 mEq/L (ref 136–145)
TOTAL PROTEIN: 6.8 g/dL (ref 6.4–8.3)

## 2015-05-30 LAB — CBC WITH DIFFERENTIAL/PLATELET
BASO%: 0.5 % (ref 0.0–2.0)
BASOS ABS: 0 10*3/uL (ref 0.0–0.1)
EOS%: 0.8 % (ref 0.0–7.0)
Eosinophils Absolute: 0.1 10*3/uL (ref 0.0–0.5)
HEMATOCRIT: 42.4 % (ref 38.4–49.9)
HGB: 14.7 g/dL (ref 13.0–17.1)
LYMPH#: 1.1 10*3/uL (ref 0.9–3.3)
LYMPH%: 16 % (ref 14.0–49.0)
MCH: 30.8 pg (ref 27.2–33.4)
MCHC: 34.7 g/dL (ref 32.0–36.0)
MCV: 88.7 fL (ref 79.3–98.0)
MONO#: 0.6 10*3/uL (ref 0.1–0.9)
MONO%: 9.4 % (ref 0.0–14.0)
NEUT#: 4.9 10*3/uL (ref 1.5–6.5)
NEUT%: 73.3 % (ref 39.0–75.0)
PLATELETS: 160 10*3/uL (ref 140–400)
RBC: 4.78 10*6/uL (ref 4.20–5.82)
RDW: 14.5 % (ref 11.0–14.6)
WBC: 6.6 10*3/uL (ref 4.0–10.3)

## 2015-05-30 MED ORDER — SODIUM CHLORIDE 0.9 % IV SOLN
Freq: Once | INTRAVENOUS | Status: AC
  Administered 2015-05-30: 14:00:00 via INTRAVENOUS
  Filled 2015-05-30: qty 8

## 2015-05-30 MED ORDER — SODIUM CHLORIDE 0.9 % IV SOLN
Freq: Once | INTRAVENOUS | Status: AC
  Administered 2015-05-30: 14:00:00 via INTRAVENOUS

## 2015-05-30 MED ORDER — DIPHENHYDRAMINE HCL 50 MG/ML IJ SOLN
INTRAMUSCULAR | Status: AC
  Filled 2015-05-30: qty 1

## 2015-05-30 MED ORDER — FAMOTIDINE IN NACL 20-0.9 MG/50ML-% IV SOLN
INTRAVENOUS | Status: AC
  Filled 2015-05-30: qty 50

## 2015-05-30 MED ORDER — SODIUM CHLORIDE 0.9 % IV SOLN
163.8000 mg | Freq: Once | INTRAVENOUS | Status: AC
  Administered 2015-05-30: 160 mg via INTRAVENOUS
  Filled 2015-05-30: qty 16

## 2015-05-30 MED ORDER — FAMOTIDINE IN NACL 20-0.9 MG/50ML-% IV SOLN
20.0000 mg | Freq: Once | INTRAVENOUS | Status: AC
  Administered 2015-05-30: 20 mg via INTRAVENOUS

## 2015-05-30 MED ORDER — PACLITAXEL CHEMO INJECTION 300 MG/50ML
45.0000 mg/m2 | Freq: Once | INTRAVENOUS | Status: AC
  Administered 2015-05-30: 78 mg via INTRAVENOUS
  Filled 2015-05-30: qty 13

## 2015-05-30 MED ORDER — DIPHENHYDRAMINE HCL 50 MG/ML IJ SOLN
50.0000 mg | Freq: Once | INTRAMUSCULAR | Status: AC
  Administered 2015-05-30: 50 mg via INTRAVENOUS

## 2015-05-30 NOTE — Patient Instructions (Signed)
[facility] Discharge Instructions for Patients Receiving Chemotherapy  Today you received the following chemotherapy agents:  Taxol and Carboplatin  To help prevent nausea and vomiting after your treatment, we encourage you to take your nausea medication as ordered per MD.   If you develop nausea and vomiting that is not controlled by your nausea medication, call the clinic.   BELOW ARE SYMPTOMS THAT SHOULD BE REPORTED IMMEDIATELY:  *FEVER GREATER THAN 100.5 F  *CHILLS WITH OR WITHOUT FEVER  NAUSEA AND VOMITING THAT IS NOT CONTROLLED WITH YOUR NAUSEA MEDICATION  *UNUSUAL SHORTNESS OF BREATH  *UNUSUAL BRUISING OR BLEEDING  TENDERNESS IN MOUTH AND THROAT WITH OR WITHOUT PRESENCE OF ULCERS  *URINARY PROBLEMS  *BOWEL PROBLEMS  UNUSUAL RASH Items with * indicate a potential emergency and should be followed up as soon as possible.  Feel free to call the clinic you have any questions or concerns. The clinic phone number is [phone].  Please show the CHEMO ALERT CARD at check-in to the Emergency Department and triage nurse.   

## 2015-05-31 ENCOUNTER — Ambulatory Visit
Admission: RE | Admit: 2015-05-31 | Discharge: 2015-05-31 | Disposition: A | Discharge status: Home / Self Care | Payer: 59 | Source: Ambulatory Visit | Attending: Radiation Oncology | Admitting: Radiation Oncology

## 2015-05-31 ENCOUNTER — Ambulatory Visit: Payer: 59

## 2015-05-31 VITALS — BP 155/78 | HR 86 | Temp 97.8°F | Wt 140.3 lb

## 2015-05-31 DIAGNOSIS — Z510 Encounter for antineoplastic radiation therapy: Secondary | ICD-10-CM | POA: Diagnosis not present

## 2015-05-31 DIAGNOSIS — C3491 Malignant neoplasm of unspecified part of right bronchus or lung: Secondary | ICD-10-CM

## 2015-05-31 LAB — AFB CULTURE WITH SMEAR (NOT AT ARMC): Acid Fast Smear: NONE SEEN

## 2015-05-31 NOTE — Progress Notes (Signed)
Weekly Management Note Current Dose: 30 Gy  Projected Dose: 60 Gy   Narrative:  The patient presents for routine under treatment assessment.  CBCT/MVCT images/Port film x-rays were reviewed.  The chart was checked. Doing well. No complaints. To ED for hemoptysis but w/u was negative. Scant hemoptysis now. Skin is red. Not using biafene.   Physical Findings:  Skin of chest and back is red.   Vitals:  Filed Vitals:   05/31/15 1711  BP: 155/78  Pulse: 86  Temp: 97.8 F (36.6 C)   Weight:  Wt Readings from Last 3 Encounters:  05/31/15 140 lb 4.8 oz (63.64 kg)  05/24/15 140 lb 9.6 oz (63.776 kg)  05/23/15 138 lb 11.2 oz (62.914 kg)   Lab Results  Component Value Date   WBC 6.6 05/30/2015   HGB 14.7 05/30/2015   HCT 42.4 05/30/2015   MCV 88.7 05/30/2015   PLT 160 05/30/2015   Lab Results  Component Value Date   CREATININE 0.9 05/30/2015   BUN 26.3* 05/30/2015   NA 140 05/30/2015   K 4.0 05/30/2015   CL 104 05/18/2015   CO2 26 05/30/2015     Impression:  The patient is tolerating radiation.  Plan:  Continue treatment as planned. Start biafene. Monitor hemoptysis.

## 2015-05-31 NOTE — Progress Notes (Signed)
Weekly assessment of radiation to right lung.Completed 15 of 30 fractions.Denies pain.Encouraged to drink at least 1 more glass water daily.On amoxicillin for tooth.No other concerns voiced.

## 2015-06-01 ENCOUNTER — Ambulatory Visit
Admission: RE | Admit: 2015-06-01 | Discharge: 2015-06-01 | Disposition: A | Discharge status: Home / Self Care | Payer: 59 | Source: Ambulatory Visit | Attending: Radiation Oncology | Admitting: Radiation Oncology

## 2015-06-01 ENCOUNTER — Ambulatory Visit: Payer: 59

## 2015-06-01 DIAGNOSIS — Z510 Encounter for antineoplastic radiation therapy: Secondary | ICD-10-CM | POA: Diagnosis not present

## 2015-06-02 ENCOUNTER — Ambulatory Visit: Payer: 59

## 2015-06-02 ENCOUNTER — Ambulatory Visit
Admission: RE | Admit: 2015-06-02 | Discharge: 2015-06-02 | Disposition: A | Discharge status: Home / Self Care | Payer: 59 | Source: Ambulatory Visit | Attending: Radiation Oncology | Admitting: Radiation Oncology

## 2015-06-02 DIAGNOSIS — Z510 Encounter for antineoplastic radiation therapy: Secondary | ICD-10-CM | POA: Diagnosis not present

## 2015-06-03 ENCOUNTER — Ambulatory Visit
Admission: RE | Admit: 2015-06-03 | Discharge: 2015-06-03 | Disposition: A | Discharge status: Home / Self Care | Payer: 59 | Source: Ambulatory Visit | Attending: Radiation Oncology | Admitting: Radiation Oncology

## 2015-06-03 ENCOUNTER — Ambulatory Visit: Payer: 59

## 2015-06-03 DIAGNOSIS — Z510 Encounter for antineoplastic radiation therapy: Secondary | ICD-10-CM | POA: Diagnosis not present

## 2015-06-06 ENCOUNTER — Ambulatory Visit
Admission: RE | Admit: 2015-06-06 | Discharge: 2015-06-06 | Disposition: A | Discharge status: Home / Self Care | Payer: 59 | Source: Ambulatory Visit | Attending: Radiation Oncology | Admitting: Radiation Oncology

## 2015-06-06 ENCOUNTER — Ambulatory Visit: Payer: 59

## 2015-06-06 ENCOUNTER — Other Ambulatory Visit: Payer: Self-pay | Admitting: Medical Oncology

## 2015-06-06 ENCOUNTER — Other Ambulatory Visit (HOSPITAL_BASED_OUTPATIENT_CLINIC_OR_DEPARTMENT_OTHER): Payer: 59

## 2015-06-06 ENCOUNTER — Ambulatory Visit (HOSPITAL_BASED_OUTPATIENT_CLINIC_OR_DEPARTMENT_OTHER): Payer: 59

## 2015-06-06 DIAGNOSIS — C3491 Malignant neoplasm of unspecified part of right bronchus or lung: Secondary | ICD-10-CM

## 2015-06-06 DIAGNOSIS — C342 Malignant neoplasm of middle lobe, bronchus or lung: Secondary | ICD-10-CM

## 2015-06-06 DIAGNOSIS — Z5111 Encounter for antineoplastic chemotherapy: Secondary | ICD-10-CM

## 2015-06-06 DIAGNOSIS — C3431 Malignant neoplasm of lower lobe, right bronchus or lung: Secondary | ICD-10-CM

## 2015-06-06 DIAGNOSIS — Z510 Encounter for antineoplastic radiation therapy: Secondary | ICD-10-CM | POA: Diagnosis not present

## 2015-06-06 LAB — COMPREHENSIVE METABOLIC PANEL (CC13)
ALBUMIN: 4 g/dL (ref 3.5–5.0)
ALK PHOS: 84 U/L (ref 40–150)
ALT: 21 U/L (ref 0–55)
AST: 21 U/L (ref 5–34)
Anion Gap: 7 mEq/L (ref 3–11)
BUN: 18.2 mg/dL (ref 7.0–26.0)
CO2: 26 mEq/L (ref 22–29)
Calcium: 9.6 mg/dL (ref 8.4–10.4)
Chloride: 105 mEq/L (ref 98–109)
Creatinine: 0.9 mg/dL (ref 0.7–1.3)
EGFR: 82 mL/min/{1.73_m2} — ABNORMAL LOW (ref >90)
GLUCOSE: 90 mg/dL (ref 70–140)
Potassium: 4 mEq/L (ref 3.5–5.1)
Sodium: 138 mEq/L (ref 136–145)
TOTAL PROTEIN: 6.9 g/dL (ref 6.4–8.3)
Total Bilirubin: 0.7 mg/dL (ref 0.20–1.20)

## 2015-06-06 LAB — CBC WITH DIFFERENTIAL/PLATELET
BASO%: 0.5 % (ref 0.0–2.0)
Basophils Absolute: 0 10*3/uL (ref 0.0–0.1)
EOS ABS: 0.1 10*3/uL (ref 0.0–0.5)
EOS%: 1.1 % (ref 0.0–7.0)
HEMATOCRIT: 45.1 % (ref 38.4–49.9)
HGB: 15.2 g/dL (ref 13.0–17.1)
LYMPH#: 0.8 10*3/uL — AB (ref 0.9–3.3)
LYMPH%: 13.3 % — ABNORMAL LOW (ref 14.0–49.0)
MCH: 30.2 pg (ref 27.2–33.4)
MCHC: 33.6 g/dL (ref 32.0–36.0)
MCV: 89.7 fL (ref 79.3–98.0)
MONO#: 0.5 10*3/uL (ref 0.1–0.9)
MONO%: 8.1 % (ref 0.0–14.0)
NEUT%: 77 % — AB (ref 39.0–75.0)
NEUTROS ABS: 4.5 10*3/uL (ref 1.5–6.5)
Platelets: 121 10*3/uL — ABNORMAL LOW (ref 140–400)
RBC: 5.02 10*6/uL (ref 4.20–5.82)
RDW: 14.8 % — AB (ref 11.0–14.6)
WBC: 5.9 10*3/uL (ref 4.0–10.3)

## 2015-06-06 MED ORDER — PACLITAXEL CHEMO INJECTION 300 MG/50ML
45.0000 mg/m2 | Freq: Once | INTRAVENOUS | Status: AC
  Administered 2015-06-06: 78 mg via INTRAVENOUS
  Filled 2015-06-06: qty 13

## 2015-06-06 MED ORDER — DIPHENHYDRAMINE HCL 50 MG/ML IJ SOLN
50.0000 mg | Freq: Once | INTRAMUSCULAR | Status: AC
  Administered 2015-06-06: 50 mg via INTRAVENOUS

## 2015-06-06 MED ORDER — SODIUM CHLORIDE 0.9 % IV SOLN
Freq: Once | INTRAVENOUS | Status: AC
  Administered 2015-06-06: 14:00:00 via INTRAVENOUS

## 2015-06-06 MED ORDER — FAMOTIDINE IN NACL 20-0.9 MG/50ML-% IV SOLN
20.0000 mg | Freq: Once | INTRAVENOUS | Status: AC
Start: 2015-06-06 — End: 2015-06-06
  Administered 2015-06-06: 20 mg via INTRAVENOUS

## 2015-06-06 MED ORDER — SODIUM CHLORIDE 0.9 % IV SOLN
Freq: Once | INTRAVENOUS | Status: AC
  Administered 2015-06-06: 14:00:00 via INTRAVENOUS
  Filled 2015-06-06: qty 8

## 2015-06-06 MED ORDER — DIPHENHYDRAMINE HCL 50 MG/ML IJ SOLN
INTRAMUSCULAR | Status: AC
  Filled 2015-06-06: qty 1

## 2015-06-06 MED ORDER — SODIUM CHLORIDE 0.9 % IV SOLN
160.0000 mg | Freq: Once | INTRAVENOUS | Status: AC
  Administered 2015-06-06: 160 mg via INTRAVENOUS
  Filled 2015-06-06: qty 16

## 2015-06-06 MED ORDER — FAMOTIDINE IN NACL 20-0.9 MG/50ML-% IV SOLN
INTRAVENOUS | Status: AC
  Filled 2015-06-06: qty 50

## 2015-06-06 NOTE — Patient Instructions (Signed)
[facility] Discharge Instructions for Patients Receiving Chemotherapy  Today you received the following chemotherapy agents carboplatin/taxol  To help prevent nausea and vomiting after your treatment, we encourage you to take your nausea medication.   If you develop nausea and vomiting that is not controlled by your nausea medication, call the clinic.   BELOW ARE SYMPTOMS THAT SHOULD BE REPORTED IMMEDIATELY:  *FEVER GREATER THAN 100.5 F  *CHILLS WITH OR WITHOUT FEVER  NAUSEA AND VOMITING THAT IS NOT CONTROLLED WITH YOUR NAUSEA MEDICATION  *UNUSUAL SHORTNESS OF BREATH  *UNUSUAL BRUISING OR BLEEDING  TENDERNESS IN MOUTH AND THROAT WITH OR WITHOUT PRESENCE OF ULCERS  *URINARY PROBLEMS  *BOWEL PROBLEMS  UNUSUAL RASH Items with * indicate a potential emergency and should be followed up as soon as possible.  Feel free to call the clinic you have any questions or concerns. The clinic phone number is [phone].  Please show the [facility] at check-in to the Emergency Department and triage nurse.

## 2015-06-06 NOTE — Progress Notes (Signed)
Per Gabriel [name] pt can be seen next week instead of today since he is chemo radiation

## 2015-06-07 ENCOUNTER — Ambulatory Visit
Admission: RE | Admit: 2015-06-07 | Discharge: 2015-06-07 | Disposition: A | Discharge status: Home / Self Care | Payer: 59 | Source: Ambulatory Visit | Attending: Radiation Oncology | Admitting: Radiation Oncology

## 2015-06-07 ENCOUNTER — Ambulatory Visit: Payer: 59

## 2015-06-07 VITALS — BP 148/78 | HR 88 | Temp 98.1°F | Wt 139.5 lb

## 2015-06-07 DIAGNOSIS — C3491 Malignant neoplasm of unspecified part of right bronchus or lung: Secondary | ICD-10-CM

## 2015-06-07 DIAGNOSIS — Z510 Encounter for antineoplastic radiation therapy: Secondary | ICD-10-CM | POA: Diagnosis not present

## 2015-06-07 NOTE — Progress Notes (Signed)
Weekly Management Note Current Dose:40 Gy  Projected Dose: 60 Gy   Narrative:  The patient presents for routine under treatment assessment.  CBCT/MVCT images/Port film x-rays were reviewed.  The chart was checked. Productive clear cough, worse when lying down. Insomnia aided by Ativan.   Physical Findings:  Unchanged. Skin irritation in front and back.   Vitals:  Filed Vitals:   06/07/15 1705  BP: 148/78  Pulse: 88  Temp: 98.1 F (36.7 C)   Weight:  Wt Readings from Last 3 Encounters:  06/07/15 139 lb 8 oz (63.277 kg)  05/31/15 140 lb 4.8 oz (63.64 kg)  05/24/15 140 lb 9.6 oz (63.776 kg)   Lab Results  Component Value Date   WBC 5.9 06/06/2015   HGB 15.2 06/06/2015   HCT 45.1 06/06/2015   MCV 89.7 06/06/2015   PLT 121* 06/06/2015   Lab Results  Component Value Date   CREATININE 0.9 06/06/2015   BUN 18.2 06/06/2015   NA 138 06/06/2015   K 4.0 06/06/2015   CL 104 05/18/2015   CO2 26 06/06/2015     Impression:  The patient is tolerating radiation.  Plan:  Continue treatment as planned. Start Gi prophylaxis with PPI. Continue biafene to skin.

## 2015-06-07 NOTE — Progress Notes (Signed)
Completed 20 of 30 treatments to right lung.Skin is red anteriorly and posterioly.Apply radiaplex twice daily.May bring with him to be applied in unreachable areas.Denies pin.Has increased cough at beditme and epigastric tickle.

## 2015-06-08 ENCOUNTER — Ambulatory Visit
Admission: RE | Admit: 2015-06-08 | Discharge: 2015-06-08 | Disposition: A | Discharge status: Home / Self Care | Payer: 59 | Source: Ambulatory Visit | Attending: Radiation Oncology | Admitting: Radiation Oncology

## 2015-06-08 ENCOUNTER — Ambulatory Visit: Payer: 59

## 2015-06-08 DIAGNOSIS — Z510 Encounter for antineoplastic radiation therapy: Secondary | ICD-10-CM | POA: Diagnosis not present

## 2015-06-09 ENCOUNTER — Ambulatory Visit
Admission: RE | Admit: 2015-06-09 | Discharge: 2015-06-09 | Disposition: A | Discharge status: Home / Self Care | Payer: 59 | Source: Ambulatory Visit | Attending: Radiation Oncology | Admitting: Radiation Oncology

## 2015-06-09 ENCOUNTER — Ambulatory Visit: Payer: 59

## 2015-06-09 DIAGNOSIS — Z510 Encounter for antineoplastic radiation therapy: Secondary | ICD-10-CM | POA: Diagnosis not present

## 2015-06-10 ENCOUNTER — Ambulatory Visit
Admission: RE | Admit: 2015-06-10 | Discharge: 2015-06-10 | Disposition: A | Discharge status: Home / Self Care | Payer: 59 | Source: Ambulatory Visit | Attending: Radiation Oncology | Admitting: Radiation Oncology

## 2015-06-10 ENCOUNTER — Ambulatory Visit: Payer: 59

## 2015-06-10 DIAGNOSIS — Z510 Encounter for antineoplastic radiation therapy: Secondary | ICD-10-CM | POA: Diagnosis not present

## 2015-06-13 ENCOUNTER — Ambulatory Visit (HOSPITAL_BASED_OUTPATIENT_CLINIC_OR_DEPARTMENT_OTHER): Payer: 59

## 2015-06-13 ENCOUNTER — Other Ambulatory Visit (HOSPITAL_BASED_OUTPATIENT_CLINIC_OR_DEPARTMENT_OTHER): Payer: 59

## 2015-06-13 ENCOUNTER — Ambulatory Visit: Payer: 59

## 2015-06-13 ENCOUNTER — Ambulatory Visit (HOSPITAL_BASED_OUTPATIENT_CLINIC_OR_DEPARTMENT_OTHER): Payer: 59 | Admitting: Internal Medicine

## 2015-06-13 ENCOUNTER — Telehealth: Payer: Self-pay | Admitting: Internal Medicine

## 2015-06-13 ENCOUNTER — Encounter: Payer: Self-pay | Admitting: Internal Medicine

## 2015-06-13 ENCOUNTER — Other Ambulatory Visit: Payer: 59

## 2015-06-13 ENCOUNTER — Ambulatory Visit
Admission: RE | Admit: 2015-06-13 | Discharge: 2015-06-13 | Disposition: A | Discharge status: Home / Self Care | Payer: 59 | Source: Ambulatory Visit | Attending: Radiation Oncology | Admitting: Radiation Oncology

## 2015-06-13 VITALS — BP 155/81 | HR 110 | Temp 98.2°F | Resp 18 | Ht 70.0 in | Wt 138.2 lb

## 2015-06-13 DIAGNOSIS — C3431 Malignant neoplasm of lower lobe, right bronchus or lung: Secondary | ICD-10-CM

## 2015-06-13 DIAGNOSIS — Z5111 Encounter for antineoplastic chemotherapy: Secondary | ICD-10-CM | POA: Diagnosis not present

## 2015-06-13 DIAGNOSIS — R05 Cough: Secondary | ICD-10-CM

## 2015-06-13 DIAGNOSIS — C342 Malignant neoplasm of middle lobe, bronchus or lung: Secondary | ICD-10-CM

## 2015-06-13 DIAGNOSIS — C3491 Malignant neoplasm of unspecified part of right bronchus or lung: Secondary | ICD-10-CM

## 2015-06-13 DIAGNOSIS — Z510 Encounter for antineoplastic radiation therapy: Secondary | ICD-10-CM | POA: Diagnosis not present

## 2015-06-13 LAB — COMPREHENSIVE METABOLIC PANEL (CC13)
ALK PHOS: 78 U/L (ref 40–150)
ALT: 19 U/L (ref 0–55)
ANION GAP: 9 meq/L (ref 3–11)
AST: 18 U/L (ref 5–34)
Albumin: 3.8 g/dL (ref 3.5–5.0)
BILIRUBIN TOTAL: 0.38 mg/dL (ref 0.20–1.20)
BUN: 19.2 mg/dL (ref 7.0–26.0)
CO2: 24 meq/L (ref 22–29)
CREATININE: 1 mg/dL (ref 0.7–1.3)
Calcium: 9.3 mg/dL (ref 8.4–10.4)
Chloride: 107 mEq/L (ref 98–109)
EGFR: 71 mL/min/{1.73_m2} — AB (ref >90)
Glucose: 124 mg/dl (ref 70–140)
Potassium: 3.7 mEq/L (ref 3.5–5.1)
Sodium: 140 mEq/L (ref 136–145)
Total Protein: 6.7 g/dL (ref 6.4–8.3)

## 2015-06-13 LAB — CBC WITH DIFFERENTIAL/PLATELET
BASO%: 0.5 % (ref 0.0–2.0)
Basophils Absolute: 0 10*3/uL (ref 0.0–0.1)
EOS ABS: 0.1 10*3/uL (ref 0.0–0.5)
EOS%: 1.3 % (ref 0.0–7.0)
HEMATOCRIT: 42.4 % (ref 38.4–49.9)
HGB: 14.1 g/dL (ref 13.0–17.1)
LYMPH#: 0.7 10*3/uL — AB (ref 0.9–3.3)
LYMPH%: 15.2 % (ref 14.0–49.0)
MCH: 29.9 pg (ref 27.2–33.4)
MCHC: 33.2 g/dL (ref 32.0–36.0)
MCV: 90 fL (ref 79.3–98.0)
MONO#: 0.3 10*3/uL (ref 0.1–0.9)
MONO%: 5.4 % (ref 0.0–14.0)
NEUT%: 77.6 % — AB (ref 39.0–75.0)
NEUTROS ABS: 3.6 10*3/uL (ref 1.5–6.5)
PLATELETS: 102 10*3/uL — AB (ref 140–400)
RBC: 4.71 10*6/uL (ref 4.20–5.82)
RDW: 15.5 % — ABNORMAL HIGH (ref 11.0–14.6)
WBC: 4.6 10*3/uL (ref 4.0–10.3)

## 2015-06-13 MED ORDER — DIPHENHYDRAMINE HCL 50 MG/ML IJ SOLN
INTRAMUSCULAR | Status: AC
  Filled 2015-06-13: qty 1

## 2015-06-13 MED ORDER — SODIUM CHLORIDE 0.9 % IV SOLN
Freq: Once | INTRAVENOUS | Status: AC
  Administered 2015-06-13: 15:00:00 via INTRAVENOUS
  Filled 2015-06-13: qty 8

## 2015-06-13 MED ORDER — SODIUM CHLORIDE 0.9 % IV SOLN
163.8000 mg | Freq: Once | INTRAVENOUS | Status: AC
  Administered 2015-06-13: 160 mg via INTRAVENOUS
  Filled 2015-06-13: qty 16

## 2015-06-13 MED ORDER — FAMOTIDINE IN NACL 20-0.9 MG/50ML-% IV SOLN
INTRAVENOUS | Status: AC
  Filled 2015-06-13: qty 50

## 2015-06-13 MED ORDER — DIPHENHYDRAMINE HCL 50 MG/ML IJ SOLN
50.0000 mg | Freq: Once | INTRAMUSCULAR | Status: AC
  Administered 2015-06-13: 50 mg via INTRAVENOUS

## 2015-06-13 MED ORDER — SODIUM CHLORIDE 0.9 % IV SOLN
Freq: Once | INTRAVENOUS | Status: AC
  Administered 2015-06-13: 15:00:00 via INTRAVENOUS

## 2015-06-13 MED ORDER — PACLITAXEL CHEMO INJECTION 300 MG/50ML
45.0000 mg/m2 | Freq: Once | INTRAVENOUS | Status: AC
  Administered 2015-06-13: 78 mg via INTRAVENOUS
  Filled 2015-06-13: qty 13

## 2015-06-13 MED ORDER — FAMOTIDINE IN NACL 20-0.9 MG/50ML-% IV SOLN
20.0000 mg | Freq: Once | INTRAVENOUS | Status: AC
  Administered 2015-06-13: 20 mg via INTRAVENOUS

## 2015-06-13 NOTE — Patient Instructions (Signed)
[facility] Discharge Instructions for Patients Receiving Chemotherapy  Today you received the following chemotherapy agents: taxol, carboplatin  To help prevent nausea and vomiting after your treatment, we encourage you to take your nausea medication.  Take it as often as prescribed.     If you develop nausea and vomiting that is not controlled by your nausea medication, call the clinic. If it is after clinic hours your family physician or the after hours number for the clinic or go to the Emergency Department.   BELOW ARE SYMPTOMS THAT SHOULD BE REPORTED IMMEDIATELY:  *FEVER GREATER THAN 100.5 F  *CHILLS WITH OR WITHOUT FEVER  NAUSEA AND VOMITING THAT IS NOT CONTROLLED WITH YOUR NAUSEA MEDICATION  *UNUSUAL SHORTNESS OF BREATH  *UNUSUAL BRUISING OR BLEEDING  TENDERNESS IN MOUTH AND THROAT WITH OR WITHOUT PRESENCE OF ULCERS  *URINARY PROBLEMS  *BOWEL PROBLEMS  UNUSUAL RASH Items with * indicate a potential emergency and should be followed up as soon as possible.  Feel free to call the clinic you have any questions or concerns. The clinic phone number is [phone].   I have been informed and understand all the instructions given to me. I know to contact the clinic, my physician, or go to the Emergency Department if any problems should occur. I do not have any questions at this time, but understand that I may call the clinic during office hours   should I have any questions or need assistance in obtaining follow up care.    __________________________________________  _____________  __________ Signature of Patient or Authorized Representative            Date                   Time    __________________________________________ Nurse's Signature    

## 2015-06-13 NOTE — Telephone Encounter (Signed)
Appointments made and avs will be printed in chemo

## 2015-06-13 NOTE — Patient Instructions (Signed)
Smoking Cessation, Tips for Success  If you are ready to quit smoking, congratulations! You have chosen to help yourself be healthier. Cigarettes bring nicotine, tar, carbon monoxide, and other irritants into your body. Your lungs, heart, and blood vessels will be able to work better without these poisons. There are many different ways to quit smoking. Nicotine gum, nicotine patches, a nicotine inhaler, or nicotine nasal spray can help with physical craving. Hypnosis, support groups, and medicines help break the habit of smoking.  WHAT THINGS CAN I DO TO MAKE QUITTING EASIER?   Here are some tips to help you quit for good:  · Pick a date when you will quit smoking completely. Tell all of your friends and family about your plan to quit on that date.  · Do not try to slowly cut down on the number of cigarettes you are smoking. Pick a quit date and quit smoking completely starting on that day.  · Throw away all cigarettes.    · Clean and remove all ashtrays from your home, work, and car.  · On a card, write down your reasons for quitting. Carry the card with you and read it when you get the urge to smoke.  · Cleanse your body of nicotine. Drink enough water and fluids to keep your urine clear or pale yellow. Do this after quitting to flush the nicotine from your body.  · Learn to predict your moods. Do not let a bad situation be your excuse to have a cigarette. Some situations in your life might tempt you into wanting a cigarette.  · Never have "just one" cigarette. It leads to wanting another and another. Remind yourself of your decision to quit.  · Change habits associated with smoking. If you smoked while driving or when feeling stressed, try other activities to replace smoking. Stand up when drinking your coffee. Brush your teeth after eating. Sit in a different chair when you read the paper. Avoid alcohol while trying to quit, and try to drink fewer caffeinated beverages. Alcohol and caffeine may urge you to  smoke.  · Avoid foods and drinks that can trigger a desire to smoke, such as sugary or spicy foods and alcohol.  · Ask people who smoke not to smoke around you.  · Have something planned to do right after eating or having a cup of coffee. For example, plan to take a walk or exercise.  · Try a relaxation exercise to calm you down and decrease your stress. Remember, you may be tense and nervous for the first 2 weeks after you quit, but this will pass.  · Find new activities to keep your hands busy. Play with a pen, coin, or rubber band. Doodle or draw things on paper.  · Brush your teeth right after eating. This will help cut down on the craving for the taste of tobacco after meals. You can also try mouthwash.    · Use oral substitutes in place of cigarettes. Try using lemon drops, carrots, cinnamon sticks, or chewing gum. Keep them handy so they are available when you have the urge to smoke.  · When you have the urge to smoke, try deep breathing.  · Designate your home as a nonsmoking area.  · If you are a heavy smoker, ask your health care provider about a prescription for nicotine chewing gum. It can ease your withdrawal from nicotine.  · Reward yourself. Set aside the cigarette money you save and buy yourself something nice.  · Look for   support from others. Join a support group or smoking cessation program. Ask someone at home or at work to help you with your plan to quit smoking.  · Always ask yourself, "Do I need this cigarette or is this just a reflex?" Tell yourself, "Today, I choose not to smoke," or "I do not want to smoke." You are reminding yourself of your decision to quit.  · Do not replace cigarette smoking with electronic cigarettes (commonly called e-cigarettes). The safety of e-cigarettes is unknown, and some may contain harmful chemicals.  · If you relapse, do not give up! Plan ahead and think about what you will do the next time you get the urge to smoke.  HOW WILL I FEEL WHEN I QUIT SMOKING?  You  may have symptoms of withdrawal because your body is used to nicotine (the addictive substance in cigarettes). You may crave cigarettes, be irritable, feel very hungry, cough often, get headaches, or have difficulty concentrating. The withdrawal symptoms are only temporary. They are strongest when you first quit but will go away within 10-14 days. When withdrawal symptoms occur, stay in control. Think about your reasons for quitting. Remind yourself that these are signs that your body is healing and getting used to being without cigarettes. Remember that withdrawal symptoms are easier to treat than the major diseases that smoking can cause.   Even after the withdrawal is over, expect periodic urges to smoke. However, these cravings are generally short lived and will go away whether you smoke or not. Do not smoke!  WHAT RESOURCES ARE AVAILABLE TO HELP ME QUIT SMOKING?  Your health care provider can direct you to community resources or hospitals for support, which may include:  · Group support.  · Education.  · Hypnosis.  · Therapy.  Document Released: 07/13/2004 Document Revised: 03/01/2014 Document Reviewed: 04/02/2013  ExitCare® Patient Information ©2015 [employer]. This information is not intended to replace advice given to you by your health care provider. Make sure you discuss any questions you have with your health care provider.

## 2015-06-13 NOTE — Progress Notes (Signed)
[facility] [phone]   [phone]  OFFICE PROGRESS NOTE  No PCP Per Patient No address on file  DIAGNOSIS: Non-small cell carcinoma of right lung, stage 3  Staging form: Lung, AJCC 7th Edition  Clinical stage from 04/21/2015: Stage IIIA (T2a, N2, M0) - Signed by Curt Bears, MD on 04/21/2015  Foundation One molecular studies reveal that he was positive for [place], [place], and STAG2 S1039fs*20. He was negative for: EGFR, ALK, BRAF, MET, RET, ERBB2 and ROS1  PRIOR THERAPY: none  CURRENT THERAPY: Concurrent chemoradiation with chemotherapy in the form of weekly carboplatin for AUC 2 paclitaxel 45 mg/m given concurrent with radiation therapy. First cycle started 05/09/2015. Status post 5 cycles.  INTERVAL HISTORY: Gabriel [name] 74 y.o. male returns to the clinic today for follow-up visit accompanied by his daughter [name] and his son Drayke. The patient is currently undergoing a course of concurrent chemoradiation with weekly carboplatin and paclitaxel status post 5 cycles and tolerating his treatment fairly well. He denied having any significant nausea or vomiting, no fever or chills. He denied having any sore throat, dysphagia or odynophagia. No significant chest pain but continues to have dry cough with no hemoptysis. He is here today to start cycle #6.  MEDICAL HISTORY: Past Medical History  Diagnosis Date  . Spontaneous pneumothorax     HISTORY OF (ONLY)  . Cancer     skin cancer  . Lung mass     right  . Inguinal hernia     ALLERGIES:  is allergic to aleve.  MEDICATIONS:  Current Outpatient Prescriptions  Medication Sig Dispense Refill  . albuterol (PROVENTIL HFA;VENTOLIN HFA) 108 (90 BASE) MCG/ACT inhaler Inhale 1-2 puffs into the lungs every 6 (six) hours as needed for wheezing or shortness of breath. 1 Inhaler 0  . amoxicillin (AMOXIL) 500 MG capsule Take 500 mg by mouth 3 (three) times daily.    .  Ascorbic Acid (VITAMIN C) 1000 MG tablet Take 1,000 mg by mouth daily.    Marland Kitchen b complex vitamins tablet Take 1 tablet by mouth daily.    . calcium-vitamin D (OSCAL WITH D) 500-200 MG-UNIT per tablet Take 1 tablet by mouth daily.    . Cyanocobalamin (VITAMIN B 12 PO) Take 1 tablet by mouth daily.    Marland Kitchen L-Arginine 1000 MG TABS Take 1,000 mg by mouth daily.    . magnesium 30 MG tablet Take 30 mg by mouth daily.    . Multiple Vitamin (MULTI VITAMIN DAILY PO) Take 1 tablet by mouth daily.    . Omega-3 Fatty Acids (FISH OIL) 1000 MG CAPS Take 1,000 mg by mouth daily.    . potassium chloride (K-DUR) 10 MEQ tablet Take 10 mEq by mouth daily.    . prochlorperazine (COMPAZINE) 10 MG tablet Take 1 tablet (10 mg total) by mouth every 6 (six) hours as needed for nausea or vomiting. 30 tablet 0  . vitamin A 10000 UNIT capsule Take 10,000 Units by mouth daily.     No current facility-administered medications for this visit.    SURGICAL HISTORY:  Past Surgical History  Procedure Laterality Date  . Hernia repair  2011  . Colonoscopy w/ biopsies and polypectomy    . Wisdom tooth extraction    . Tonsillectomy    . Video bronchoscopy with endobronchial ultrasound N/A 04/18/2015    Procedure: VIDEO BRONCHOSCOPY WITH ENDOBRONCHIAL ULTRASOUND;  Surgeon: Grace Isaac, MD;  Location: [facility];  Service:  Thoracic;  Laterality: N/A;    REVIEW OF SYSTEMS:  A comprehensive review of systems was negative except for: Respiratory: positive for cough   PHYSICAL EXAMINATION: General appearance: alert, cooperative and no distress Head: Normocephalic, without obvious abnormality, atraumatic Neck: no adenopathy, no JVD, supple, symmetrical, trachea midline and thyroid not enlarged, symmetric, no tenderness/mass/nodules Lymph nodes: Cervical, supraclavicular, and axillary nodes normal. Resp: clear to auscultation bilaterally Back: symmetric, no curvature. ROM normal. No CVA tenderness. Cardio: regular rate and rhythm, S1,  S2 normal, no murmur, click, rub or gallop GI: soft, non-tender; bowel sounds normal; no masses,  no organomegaly Extremities: extremities normal, atraumatic, no cyanosis or edema  ECOG PERFORMANCE STATUS: 1 - Symptomatic but completely ambulatory  Blood pressure 155/81, pulse 110, temperature 98.2 F (36.8 C), temperature source Oral, resp. rate 18, height $RemoveBe'5\' 10"'yYURaFMxY$  (1.778 m), weight 138 lb 3.2 oz (62.687 kg), SpO2 98 %.  LABORATORY DATA: Lab Results  Component Value Date   WBC 4.6 06/13/2015   HGB 14.1 06/13/2015   HCT 42.4 06/13/2015   MCV 90.0 06/13/2015   PLT 102* 06/13/2015      Chemistry      Component Value Date/Time   NA 138 06/06/2015 1244   NA 136 05/18/2015 2136   K 4.0 06/06/2015 1244   K 4.0 05/18/2015 2136   CL 104 05/18/2015 2136   CO2 26 06/06/2015 1244   CO2 24 05/18/2015 2136   BUN 18.2 06/06/2015 1244   BUN 21* 05/18/2015 2136   CREATININE 0.9 06/06/2015 1244   CREATININE 0.97 05/18/2015 2136      Component Value Date/Time   CALCIUM 9.6 06/06/2015 1244   CALCIUM 9.1 05/18/2015 2136   ALKPHOS 84 06/06/2015 1244   ALKPHOS 67 05/18/2015 2136   AST 21 06/06/2015 1244   AST 26 05/18/2015 2136   ALT 21 06/06/2015 1244   ALT 20 05/18/2015 2136   BILITOT 0.70 06/06/2015 1244   BILITOT 0.6 05/18/2015 2136       RADIOGRAPHIC STUDIES: Dg Chest 2 View  05/18/2015   CLINICAL DATA:  74 year old male with hemoptysis  EXAM: CHEST  2 VIEW  COMPARISON:  Chest radiograph dated 04/18/2015 and CT dated 04/05/2015  FINDINGS: Two views of the chest demonstrate emphysematous changes of the lungs. There is no focal consolidation, pleural effusion, or pneumothorax. Right perihilar linear atelectasis/ scarring. The cardiomediastinal silhouette is within normal limits. The osseous structures are grossly unremarkable.  IMPRESSION: Emphysema with right lung linear atelectasis/ scarring. No focal consolidation.   Electronically Signed   By: Anner Crete M.D.   On: 05/18/2015  22:22    ASSESSMENT AND PLAN: This is a very pleasant 74 years old white male with stage IIIa non-small cell lung cancer currently undergoing concurrent chemoradiation with weekly carboplatin and paclitaxel is status post 5 cycles and tolerating his treatment fairly well with no significant adverse effects. I recommended for the patient to continue his treatment and he will receive cycle #6 today. The patient would come back for follow-up visit in 6 weeks for reevaluation after repeating CT scan of the chest for restaging of his disease. He was advised to call immediately if he has any concerning symptoms in the interval. The patient voices understanding of current disease status and treatment options and is in agreement with the current care plan.  All questions were answered. The patient knows to call the clinic with any problems, questions or concerns. We can certainly see the patient much sooner if necessary.  Disclaimer:  This note was dictated with voice recognition software. Similar sounding words can inadvertently be transcribed and may not be corrected upon review.

## 2015-06-14 ENCOUNTER — Ambulatory Visit: Payer: 59

## 2015-06-14 ENCOUNTER — Ambulatory Visit
Admission: RE | Admit: 2015-06-14 | Discharge: 2015-06-14 | Disposition: A | Discharge status: Home / Self Care | Payer: 59 | Source: Ambulatory Visit | Attending: Radiation Oncology | Admitting: Radiation Oncology

## 2015-06-14 VITALS — BP 150/79 | HR 95 | Temp 97.4°F | Wt 139.3 lb

## 2015-06-14 DIAGNOSIS — Z510 Encounter for antineoplastic radiation therapy: Secondary | ICD-10-CM | POA: Diagnosis not present

## 2015-06-14 DIAGNOSIS — C3491 Malignant neoplasm of unspecified part of right bronchus or lung: Secondary | ICD-10-CM

## 2015-06-14 NOTE — Progress Notes (Signed)
Weekly Management Note Current Dose:50 Gy  Projected Dose:60 Gy   Narrative:  The patient presents for routine under treatment assessment.  CBCT/MVCT images/Port film x-rays were reviewed.  The chart was checked. Doing well. No swallowing problems.   Physical Findings: Pink skin anterior and posterior back.   Vitals:  Filed Vitals:   06/14/15 1645  BP: 150/79  Pulse: 95  Temp: 97.4 F (36.3 C)   Weight:  Wt Readings from Last 3 Encounters:  06/14/15 139 lb 4.8 oz (63.186 kg)  06/13/15 138 lb 3.2 oz (62.687 kg)  06/07/15 139 lb 8 oz (63.277 kg)   Lab Results  Component Value Date   WBC 4.6 06/13/2015   HGB 14.1 06/13/2015   HCT 42.4 06/13/2015   MCV 90.0 06/13/2015   PLT 102* 06/13/2015   Lab Results  Component Value Date   CREATININE 1.0 06/13/2015   BUN 19.2 06/13/2015   NA 140 06/13/2015   K 3.7 06/13/2015   CL 104 05/18/2015   CO2 24 06/13/2015     Impression:  The patient is tolerating radiation.  Plan:  Continue treatment as planned. Continue radiaplex.

## 2015-06-14 NOTE — Progress Notes (Signed)
Completed 25 of 30 treatments to right lung.Denies pain,shortness of breath.Has light blood-tinged sputum at times.States skin is mildly discolored soothed by radiaplex.No concerns voiced.

## 2015-06-15 ENCOUNTER — Ambulatory Visit: Payer: 59

## 2015-06-15 ENCOUNTER — Ambulatory Visit
Admission: RE | Admit: 2015-06-15 | Discharge: 2015-06-15 | Disposition: A | Discharge status: Home / Self Care | Payer: 59 | Source: Ambulatory Visit | Attending: Radiation Oncology | Admitting: Radiation Oncology

## 2015-06-15 DIAGNOSIS — Z510 Encounter for antineoplastic radiation therapy: Secondary | ICD-10-CM | POA: Diagnosis not present

## 2015-06-16 ENCOUNTER — Ambulatory Visit: Payer: 59

## 2015-06-16 ENCOUNTER — Ambulatory Visit
Admission: RE | Admit: 2015-06-16 | Discharge: 2015-06-16 | Disposition: A | Discharge status: Home / Self Care | Payer: 59 | Source: Ambulatory Visit | Attending: Radiation Oncology | Admitting: Radiation Oncology

## 2015-06-16 DIAGNOSIS — Z510 Encounter for antineoplastic radiation therapy: Secondary | ICD-10-CM | POA: Diagnosis not present

## 2015-06-17 ENCOUNTER — Ambulatory Visit: Payer: 59

## 2015-06-17 ENCOUNTER — Ambulatory Visit
Admission: RE | Admit: 2015-06-17 | Discharge: 2015-06-17 | Disposition: A | Discharge status: Home / Self Care | Payer: 59 | Source: Ambulatory Visit | Attending: Radiation Oncology | Admitting: Radiation Oncology

## 2015-06-17 DIAGNOSIS — Z510 Encounter for antineoplastic radiation therapy: Secondary | ICD-10-CM | POA: Diagnosis not present

## 2015-06-20 ENCOUNTER — Ambulatory Visit
Admission: RE | Admit: 2015-06-20 | Discharge: 2015-06-20 | Disposition: A | Discharge status: Home / Self Care | Payer: 59 | Source: Ambulatory Visit | Attending: Radiation Oncology | Admitting: Radiation Oncology

## 2015-06-20 ENCOUNTER — Ambulatory Visit (HOSPITAL_BASED_OUTPATIENT_CLINIC_OR_DEPARTMENT_OTHER): Payer: 59

## 2015-06-20 ENCOUNTER — Other Ambulatory Visit (HOSPITAL_BASED_OUTPATIENT_CLINIC_OR_DEPARTMENT_OTHER): Payer: 59

## 2015-06-20 VITALS — BP 149/80 | HR 105 | Temp 97.0°F | Resp 18

## 2015-06-20 DIAGNOSIS — C3491 Malignant neoplasm of unspecified part of right bronchus or lung: Secondary | ICD-10-CM

## 2015-06-20 DIAGNOSIS — C342 Malignant neoplasm of middle lobe, bronchus or lung: Secondary | ICD-10-CM

## 2015-06-20 DIAGNOSIS — C3431 Malignant neoplasm of lower lobe, right bronchus or lung: Secondary | ICD-10-CM

## 2015-06-20 DIAGNOSIS — Z5111 Encounter for antineoplastic chemotherapy: Secondary | ICD-10-CM | POA: Diagnosis not present

## 2015-06-20 DIAGNOSIS — Z510 Encounter for antineoplastic radiation therapy: Secondary | ICD-10-CM | POA: Diagnosis not present

## 2015-06-20 LAB — CBC WITH DIFFERENTIAL/PLATELET
BASO%: 0.7 % (ref 0.0–2.0)
BASOS ABS: 0 10*3/uL (ref 0.0–0.1)
EOS%: 1.2 % (ref 0.0–7.0)
Eosinophils Absolute: 0 10*3/uL (ref 0.0–0.5)
HEMATOCRIT: 41.7 % (ref 38.4–49.9)
HGB: 13.9 g/dL (ref 13.0–17.1)
LYMPH#: 0.7 10*3/uL — AB (ref 0.9–3.3)
LYMPH%: 20.8 % (ref 14.0–49.0)
MCH: 30.2 pg (ref 27.2–33.4)
MCHC: 33.4 g/dL (ref 32.0–36.0)
MCV: 90.4 fL (ref 79.3–98.0)
MONO#: 0.2 10*3/uL (ref 0.1–0.9)
MONO%: 7.1 % (ref 0.0–14.0)
NEUT#: 2.2 10*3/uL (ref 1.5–6.5)
NEUT%: 70.2 % (ref 39.0–75.0)
PLATELETS: 115 10*3/uL — AB (ref 140–400)
RBC: 4.61 10*6/uL (ref 4.20–5.82)
RDW: 16 % — ABNORMAL HIGH (ref 11.0–14.6)
WBC: 3.2 10*3/uL — ABNORMAL LOW (ref 4.0–10.3)

## 2015-06-20 LAB — COMPREHENSIVE METABOLIC PANEL (CC13)
ALT: 18 U/L (ref 0–55)
ANION GAP: 10 meq/L (ref 3–11)
AST: 19 U/L (ref 5–34)
Albumin: 3.7 g/dL (ref 3.5–5.0)
Alkaline Phosphatase: 72 U/L (ref 40–150)
BUN: 22.6 mg/dL (ref 7.0–26.0)
CALCIUM: 9.6 mg/dL (ref 8.4–10.4)
CHLORIDE: 107 meq/L (ref 98–109)
CO2: 22 mEq/L (ref 22–29)
Creatinine: 1 mg/dL (ref 0.7–1.3)
EGFR: 75 mL/min/{1.73_m2} — AB (ref >90)
Glucose: 119 mg/dl (ref 70–140)
POTASSIUM: 4.2 meq/L (ref 3.5–5.1)
Sodium: 140 mEq/L (ref 136–145)
Total Bilirubin: 0.38 mg/dL (ref 0.20–1.20)
Total Protein: 6.7 g/dL (ref 6.4–8.3)

## 2015-06-20 MED ORDER — DIPHENHYDRAMINE HCL 50 MG/ML IJ SOLN
INTRAMUSCULAR | Status: AC
  Filled 2015-06-20: qty 1

## 2015-06-20 MED ORDER — SODIUM CHLORIDE 0.9 % IV SOLN
163.8000 mg | Freq: Once | INTRAVENOUS | Status: AC
  Administered 2015-06-20: 160 mg via INTRAVENOUS
  Filled 2015-06-20: qty 16

## 2015-06-20 MED ORDER — PACLITAXEL CHEMO INJECTION 300 MG/50ML
45.0000 mg/m2 | Freq: Once | INTRAVENOUS | Status: AC
  Administered 2015-06-20: 78 mg via INTRAVENOUS
  Filled 2015-06-20: qty 13

## 2015-06-20 MED ORDER — SODIUM CHLORIDE 0.9 % IV SOLN
Freq: Once | INTRAVENOUS | Status: AC
  Administered 2015-06-20: 14:00:00 via INTRAVENOUS
  Filled 2015-06-20: qty 8

## 2015-06-20 MED ORDER — DIPHENHYDRAMINE HCL 50 MG/ML IJ SOLN
50.0000 mg | Freq: Once | INTRAMUSCULAR | Status: AC
  Administered 2015-06-20: 50 mg via INTRAVENOUS

## 2015-06-20 MED ORDER — SODIUM CHLORIDE 0.9 % IV SOLN
Freq: Once | INTRAVENOUS | Status: AC
  Administered 2015-06-20: 13:00:00 via INTRAVENOUS

## 2015-06-20 MED ORDER — FAMOTIDINE IN NACL 20-0.9 MG/50ML-% IV SOLN
20.0000 mg | Freq: Once | INTRAVENOUS | Status: AC
  Administered 2015-06-20: 20 mg via INTRAVENOUS

## 2015-06-20 MED ORDER — FAMOTIDINE IN NACL 20-0.9 MG/50ML-% IV SOLN
INTRAVENOUS | Status: AC
  Filled 2015-06-20: qty 50

## 2015-06-20 NOTE — Patient Instructions (Addendum)
[facility] Discharge Instructions for Patients Receiving Chemotherapy  Today you received the following chemotherapy agents Taxol/Carboplatin.  To help prevent nausea and vomiting after your treatment, we encourage you to take your nausea medication as directed.   If you develop nausea and vomiting that is not controlled by your nausea medication, call the clinic.   BELOW ARE SYMPTOMS THAT SHOULD BE REPORTED IMMEDIATELY:  *FEVER GREATER THAN 100.5 F  *CHILLS WITH OR WITHOUT FEVER  NAUSEA AND VOMITING THAT IS NOT CONTROLLED WITH YOUR NAUSEA MEDICATION  *UNUSUAL SHORTNESS OF BREATH  *UNUSUAL BRUISING OR BLEEDING  TENDERNESS IN MOUTH AND THROAT WITH OR WITHOUT PRESENCE OF ULCERS  *URINARY PROBLEMS  *BOWEL PROBLEMS  UNUSUAL RASH Items with * indicate a potential emergency and should be followed up as soon as possible.  Feel free to call the clinic you have any questions or concerns. The clinic phone number is [phone].  Please show the [facility] at check-in to the Emergency Department and triage nurse.

## 2015-06-21 ENCOUNTER — Ambulatory Visit
Admission: RE | Admit: 2015-06-21 | Discharge: 2015-06-21 | Disposition: A | Discharge status: Home / Self Care | Payer: 59 | Source: Ambulatory Visit | Attending: Radiation Oncology | Admitting: Radiation Oncology

## 2015-06-21 VITALS — BP 113/73 | HR 91 | Temp 98.0°F | Wt 137.0 lb

## 2015-06-21 DIAGNOSIS — C3491 Malignant neoplasm of unspecified part of right bronchus or lung: Secondary | ICD-10-CM | POA: Diagnosis not present

## 2015-06-21 DIAGNOSIS — Z720 Tobacco use: Secondary | ICD-10-CM | POA: Diagnosis not present

## 2015-06-21 DIAGNOSIS — Z510 Encounter for antineoplastic radiation therapy: Secondary | ICD-10-CM | POA: Diagnosis not present

## 2015-06-21 MED ORDER — BIAFINE EX EMUL
CUTANEOUS | Status: DC | PRN
  Administered 2015-06-21: 18:00:00 via TOPICAL

## 2015-06-21 NOTE — Progress Notes (Signed)
Patient has completed 30/30 radiation treatments to right lung.Denies pain.Has skin irritation and discoloration greater posterior than anterior.Give another tube of biafine.follow up in one month.

## 2015-06-21 NOTE — Progress Notes (Signed)
  Radiation Oncology         [phone] ________________________________  Name: CLAUDE WALDMAN MRN: [id number]  Date: 06/21/2015  DOB: [birth year]  End of Treatment Note  Diagnosis:   Non-small cell carcinoma of right lung, stage 3   Staging form: Lung, AJCC 7th Edition     Clinical stage from 04/21/2015: Stage IIIA (T2a, N2, M0) - Signed by Curt Bears, MD on 04/21/2015  Indication for treatment:  Curative       Radiation treatment dates:   05/11/15-06/21/15  Site/dose:   Right lung/ 60 Gy in 30 fractions at 2 Gy per fraction  Beams/energy:   3D with 6 and 10 MV photons  Narrative: The patient tolerated radiation treatment relatively well.   He had dry desquamation over his back. He had no weight loss and no esophagitis.  Plan: The patient has completed radiation treatment. The patient will return to radiation oncology clinic for routine followup in one month. I advised them to call or return sooner if they have any questions or concerns related to their recovery or treatment.  ------------------------------------------------  [name], MD

## 2015-06-21 NOTE — Addendum Note (Signed)
Encounter addended by: Norm Salt, RN on: 06/21/2015  6:01 PM<BR>     Documentation filed: Inpatient MAR

## 2015-06-21 NOTE — Addendum Note (Signed)
Encounter addended by: Norm Salt, RN on: 06/21/2015  5:58 PM<BR>     Documentation filed: Orders, Dx Association

## 2015-07-01 ENCOUNTER — Ambulatory Visit: Payer: Self-pay | Admitting: Surgery

## 2015-07-01 NOTE — H&P (Signed)
Chief Complaint:  Left inguinal hernia  History of Present Illness:  Gabriel [name] is an 74 y.o. male who was seen in the office regarding a new left inguinal hernia (I had previously repaired a right inguinal hernia).  He had just received a dx of lung cancer and was beginning chemotherapy.  He called the office and wanted to have this repaired between chemotherapy sessions.  He must have failed the truss treatment.   Past Medical History  Diagnosis Date  . Spontaneous pneumothorax     HISTORY OF (ONLY)  . Cancer     skin cancer  . Lung mass     right  . Inguinal hernia     Past Surgical History  Procedure Laterality Date  . Hernia repair  2011  . Colonoscopy w/ biopsies and polypectomy    . Wisdom tooth extraction    . Tonsillectomy    . Video bronchoscopy with endobronchial ultrasound N/A 04/18/2015    Procedure: VIDEO BRONCHOSCOPY WITH ENDOBRONCHIAL ULTRASOUND;  Surgeon: Grace Isaac, MD;  Location: [facility] OR;  Service: Thoracic;  Laterality: N/A;    Current Outpatient Prescriptions  Medication Sig Dispense Refill  . albuterol (PROVENTIL HFA;VENTOLIN HFA) 108 (90 BASE) MCG/ACT inhaler Inhale 1-2 puffs into the lungs every 6 (six) hours as needed for wheezing or shortness of breath. 1 Inhaler 0  . Ascorbic Acid (VITAMIN C) 1000 MG tablet Take 1,000 mg by mouth daily.    Marland Kitchen b complex vitamins tablet Take 1 tablet by mouth daily.    . calcium-vitamin D (OSCAL WITH D) 500-200 MG-UNIT per tablet Take 1 tablet by mouth daily.    . Cyanocobalamin (VITAMIN B 12 PO) Take 1 tablet by mouth daily.    Marland Kitchen docusate sodium (COLACE) 100 MG capsule Take 100 mg by mouth daily.    Marland Kitchen emollient (BIAFINE) cream Apply 90 g topically 2 (two) times daily.    Marland Kitchen L-Arginine 1000 MG TABS Take 1,000 mg by mouth daily.    . magnesium 30 MG tablet Take 30 mg by mouth daily.    . Multiple Vitamin (MULTI VITAMIN DAILY PO) Take 1 tablet by mouth daily.    . Omega-3 Fatty Acids (FISH OIL) 1000 MG CAPS Take  1,000 mg by mouth daily.    . potassium chloride (K-DUR) 10 MEQ tablet Take 10 mEq by mouth daily.    . prochlorperazine (COMPAZINE) 10 MG tablet Take 1 tablet (10 mg total) by mouth every 6 (six) hours as needed for nausea or vomiting. (Patient not taking: Reported on 06/13/2015) 30 tablet 0  . vitamin A 10000 UNIT capsule Take 10,000 Units by mouth daily.     No current facility-administered medications for this visit.   Aleve Family History  Problem Relation Age of Onset  . Heart attack Father   . Hypertension Mother   . Heart disease Father    Social History:   reports that he has been smoking Cigarettes.  He has a 27.5 pack-year smoking history. He has never used smokeless tobacco. He reports that he drinks alcohol. He reports that he does not use illicit drugs.   REVIEW OF SYSTEMS : Negative except for see problem list  Physical Exam:   There were no vitals taken for this visit. There is no weight on file to calculate BMI.  Gen:  WDWN WM NAD  Neurological: Alert and oriented to person, place, and time. Motor and sensory function is grossly intact  Head: Normocephalic and atraumatic.  Eyes: Conjunctivae  are normal. Pupils are equal, round, and reactive to light. No scleral icterus.  Neck: Normal range of motion. Neck supple. No tracheal deviation or thyromegaly present.  Cardiovascular:  SR without murmurs or gallops.  No carotid bruits Breast:  Not examined Respiratory: Effort normal.  No respiratory distress. No chest wall tenderness. Breath sounds normal.  No wheezes, rales or rhonchi.  Abdomen:  nontender GU:  Prior open RIH; now with reducible left inguinal hernia Musculoskeletal: Normal range of motion. Extremities are nontender. No cyanosis, edema or clubbing noted Lymphadenopathy: No cervical, preauricular, postauricular or axillary adenopathy is present Skin: Skin is warm and dry. No rash noted. No diaphoresis. No erythema. No pallor. Pscyh: Normal mood and affect.  Behavior is normal. Judgment and thought content normal.   LABORATORY RESULTS: No results found for this or any previous visit (from the past 48 hour(s)).   RADIOLOGY RESULTS: No results found.  Problem List: Patient Active Problem List   Diagnosis Date Noted  . Encounter for antineoplastic chemotherapy 05/09/2015  . Non-small cell carcinoma of right lung, stage 3 04/21/2015  . Lung mass 04/18/2015  . Pneumothorax 04/04/2015  . Pneumothorax on right 04/04/2015  . Hemoptysis, unspecified 10/07/2013    Assessment & Plan: Left inguinal hernia Open left inguinal hernia    Matt B. Hassell Done, MD, [facility] [phone] beeper [phone]  07/01/2015 3:43 PM    .

## 2015-07-07 ENCOUNTER — Encounter: Payer: Self-pay | Admitting: Radiation Oncology

## 2015-07-07 ENCOUNTER — Telehealth: Payer: Self-pay | Admitting: *Deleted

## 2015-07-07 NOTE — Patient Instructions (Addendum)
YOUR PROCEDURE IS SCHEDULED ON :  07/15/15  REPORT TO [place] MAIN ENTRANCE FOLLOW SIGNS TO EAST ELEVATOR - GO TO 3rd FLOOR CHECK IN AT 3 EAST NURSES STATION (SHORT STAY) AT: 5:30 AM  CALL THIS NUMBER IF YOU HAVE PROBLEMS THE MORNING OF SURGERY [phone]  REMEMBER:ONLY 1 PER PERSON MAY GO TO SHORT STAY WITH YOU TO GET READY THE MORNING OF YOUR SURGERY  DO NOT EAT FOOD OR DRINK LIQUIDS AFTER MIDNIGHT  TAKE THESE MEDICINES THE MORNING OF SURGERY: MAY USE INHALER IF NEEDED  BRING ALBUTEROL INHALER TO HOSPITAL  STOP ASPIRIN / IBUPROFEN / ALEVE / VITAMINS / HERBAL MEDS __5__ DAYS BEFORE SURGERY  YOU MAY NOT HAVE ANY METAL ON YOUR BODY INCLUDING HAIR PINS AND PIERCING'S. DO NOT WEAR JEWELRY, MAKEUP, LOTIONS, POWDERS OR PERFUMES. DO NOT WEAR NAIL POLISH. DO NOT SHAVE 48 HRS PRIOR TO SURGERY. MEN MAY SHAVE FACE AND NECK.  DO NOT Cement. [facility] IS NOT RESPONSIBLE FOR VALUABLES.  CONTACTS, DENTURES OR PARTIALS MAY NOT BE WORN TO SURGERY. LEAVE SUITCASE IN CAR. CAN BE BROUGHT TO ROOM AFTER SURGERY.  PATIENTS DISCHARGED THE DAY OF SURGERY WILL NOT BE ALLOWED TO DRIVE HOME.  PLEASE READ OVER THE FOLLOWING INSTRUCTION SHEETS _________________________________________________________________________________                                          [place] - PREPARING FOR SURGERY  Before surgery, you can play an important role.  Because skin is not sterile, your skin needs to be as free of germs as possible.  You can reduce the number of germs on your skin by washing with CHG (chlorahexidine gluconate) soap before surgery.  CHG is an antiseptic cleaner which kills germs and bonds with the skin to continue killing germs even after washing. Please DO NOT use if you have an allergy to CHG or antibacterial soaps.  If your skin becomes reddened/irritated stop using the CHG and inform your nurse when you arrive at Short Stay. Do not shave (including  legs and underarms) for at least 48 hours prior to the first CHG shower.  You may shave your face. Please follow these instructions carefully:   1.  Shower with CHG Soap the night before surgery and the  morning of Surgery.   2.  If you choose to wash your hair, wash your hair first as usual with your  normal  Shampoo.   3.  After you shampoo, rinse your hair and body thoroughly to remove the  shampoo.                                         4.  Use CHG as you would any other liquid soap.  You can apply chg directly  to the skin and wash . Gently wash with scrungie or clean wascloth    5.  Apply the CHG Soap to your body ONLY FROM THE NECK DOWN.   Do not use on open                           Wound or open sores. Avoid contact with eyes, ears mouth and genitals (private parts).  Genitals (private parts) with your normal soap.              6.  Wash thoroughly, paying special attention to the area where your surgery  will be performed.   7.  Thoroughly rinse your body with warm water from the neck down.   8.  DO NOT shower/wash with your normal soap after using and rinsing off  the CHG Soap .                9.  Pat yourself dry with a clean towel.             10.  Wear clean night clothes to bed after shower             11.  Place clean sheets on your bed the night of your first shower and do not  sleep with pets.  Day of Surgery : Do not apply any lotions/deodorants the morning of surgery.  Please wear clean clothes to the hospital/surgery center.  FAILURE TO FOLLOW THESE INSTRUCTIONS MAY RESULT IN THE CANCELLATION OF YOUR SURGERY    PATIENT SIGNATURE_________________________________  ______________________________________________________________________

## 2015-07-07 NOTE — Telephone Encounter (Signed)
"  Would you pl ease let Dr. Julien Nordmann know I am scheduled for Hernia Surgery 07-15-2015."

## 2015-07-07 NOTE — Progress Notes (Signed)
  Radiation Oncology         [phone] ________________________________  Name: Gabriel [name] MRN: [id number]  Date: 07/07/2015  DOB: Jul 23, 1941  End of Treatment Note  Diagnosis:   Non-small cell carcinoma of right lung, stage 3   Staging form: Lung, AJCC 7th Edition     Clinical stage from 04/21/2015: Stage IIIA (T2a, N2, M0) - Signed by Curt Bears, MD on 04/21/2015      Indication for treatment:  Curative, Stage III Non-small cell cancer of the right lung.  Radiation treatment dates: 05/11/2015-06/21/2015  Site/dose: Right lung 60 Gy @ 2 Gy per fraction X30  Beams/energy: 3D conformal with 6 and 10 MV photons and daily CBCT for image guidance.  Narrative: The patient tolerated radiation treatment relatively well. He maintained his weight and did not have any dysphagia. He was able to continue working through out his course of treatment.   Plan: The patient has completed radiation treatment. The patient will return to radiation oncology clinic for routine followup in one month. I advised them to call or return sooner if they have any questions or concerns related to their recovery or treatment.   This document serves as a record of services personally performed by [name] , MD. It was created on her behalf by Lenn Cal, a trained medical scribe. The creation of this record is based on the scribe's personal observations and the provider's statements to them. This document has been checked and approved by the attending provider.   ------------------------------------------------  [name], MD

## 2015-07-08 ENCOUNTER — Encounter (HOSPITAL_COMMUNITY)
Admission: RE | Admit: 2015-07-08 | Discharge: 2015-07-08 | Disposition: A | Discharge status: Home / Self Care | Payer: 59 | Source: Ambulatory Visit | Attending: Surgery | Admitting: Surgery

## 2015-07-08 ENCOUNTER — Encounter (INDEPENDENT_AMBULATORY_CARE_PROVIDER_SITE_OTHER): Payer: Self-pay

## 2015-07-08 ENCOUNTER — Encounter (HOSPITAL_COMMUNITY): Payer: Self-pay

## 2015-07-08 DIAGNOSIS — K4090 Unilateral inguinal hernia, without obstruction or gangrene, not specified as recurrent: Secondary | ICD-10-CM | POA: Insufficient documentation

## 2015-07-08 DIAGNOSIS — Z01818 Encounter for other preprocedural examination: Secondary | ICD-10-CM | POA: Insufficient documentation

## 2015-07-08 HISTORY — DX: Constipation, unspecified: K59.00

## 2015-07-08 HISTORY — DX: Personal history of other malignant neoplasm of skin: Z85.828

## 2015-07-08 HISTORY — DX: Other specified cough: R05.8

## 2015-07-08 HISTORY — DX: Cough: R05

## 2015-07-08 HISTORY — DX: Sleep disorder, unspecified: G47.9

## 2015-07-08 LAB — BASIC METABOLIC PANEL
ANION GAP: 8 (ref 5–15)
BUN: 18 mg/dL (ref 6–20)
CHLORIDE: 102 mmol/L (ref 101–111)
CO2: 28 mmol/L (ref 22–32)
Calcium: 9.6 mg/dL (ref 8.9–10.3)
Creatinine, Ser: 0.98 mg/dL (ref 0.61–1.24)
GFR calc non Af Amer: >60 mL/min (ref >60)
Glucose, Bld: 104 mg/dL — ABNORMAL HIGH (ref 65–99)
POTASSIUM: 5.1 mmol/L (ref 3.5–5.1)
Sodium: 138 mmol/L (ref 135–145)

## 2015-07-08 LAB — CBC
HEMATOCRIT: 40.4 % (ref 39.0–52.0)
HEMOGLOBIN: 13.6 g/dL (ref 13.0–17.0)
MCH: 30.7 pg (ref 26.0–34.0)
MCHC: 33.7 g/dL (ref 30.0–36.0)
MCV: 91.2 fL (ref 78.0–100.0)
Platelets: 220 10*3/uL (ref 150–400)
RBC: 4.43 MIL/uL (ref 4.22–5.81)
RDW: 17.2 % — ABNORMAL HIGH (ref 11.5–15.5)
WBC: 5.9 10*3/uL (ref 4.0–10.5)

## 2015-07-08 NOTE — Telephone Encounter (Signed)
Note routed to Mohamed. 

## 2015-07-11 ENCOUNTER — Inpatient Hospital Stay (HOSPITAL_COMMUNITY): Admission: RE | Admit: 2015-07-11 | Payer: 59 | Source: Ambulatory Visit

## 2015-07-14 NOTE — Progress Notes (Signed)
Verified with OR patient's OR time changed. Called and left msg. On patient's phone re time change to be at Short Stay at 10:50 May have clear liquids mn to 6:30 am (listed what patient could have) Asked patient to call us back to confirm.

## 2015-07-14 NOTE — Progress Notes (Signed)
Per [place] and [place] OR scheduling, pt surgery changed back to 0730, left message with patient to arrive at 0530 and be NPO after midnight. Asked patient to call back to confirm he got message, or if he has questions

## 2015-07-15 ENCOUNTER — Ambulatory Visit (HOSPITAL_COMMUNITY): Payer: 59 | Admitting: Anesthesiology

## 2015-07-15 ENCOUNTER — Encounter (HOSPITAL_COMMUNITY)
Admission: RE | Disposition: A | Discharge status: Home / Self Care | Payer: Self-pay | Source: Ambulatory Visit | Attending: Surgery

## 2015-07-15 ENCOUNTER — Ambulatory Visit (HOSPITAL_COMMUNITY)
Admission: RE | Admit: 2015-07-15 | Discharge: 2015-07-15 | Disposition: A | Discharge status: Home / Self Care | Payer: 59 | Source: Ambulatory Visit | Attending: Surgery | Admitting: Surgery

## 2015-07-15 ENCOUNTER — Encounter (HOSPITAL_COMMUNITY): Payer: Self-pay | Admitting: *Deleted

## 2015-07-15 DIAGNOSIS — F17210 Nicotine dependence, cigarettes, uncomplicated: Secondary | ICD-10-CM | POA: Insufficient documentation

## 2015-07-15 DIAGNOSIS — C3490 Malignant neoplasm of unspecified part of unspecified bronchus or lung: Secondary | ICD-10-CM | POA: Diagnosis not present

## 2015-07-15 DIAGNOSIS — Z79899 Other long term (current) drug therapy: Secondary | ICD-10-CM | POA: Diagnosis not present

## 2015-07-15 DIAGNOSIS — K4090 Unilateral inguinal hernia, without obstruction or gangrene, not specified as recurrent: Secondary | ICD-10-CM | POA: Diagnosis present

## 2015-07-15 HISTORY — PX: INGUINAL HERNIA REPAIR: SHX194

## 2015-07-15 SURGERY — REPAIR, HERNIA, INGUINAL, ADULT
Anesthesia: General | Site: Groin | Laterality: Left

## 2015-07-15 MED ORDER — FENTANYL CITRATE (PF) 100 MCG/2ML IJ SOLN
INTRAMUSCULAR | Status: AC
  Filled 2015-07-15: qty 2

## 2015-07-15 MED ORDER — CHLORHEXIDINE GLUCONATE 4 % EX LIQD: 1.0000 "application " | Freq: Once | CUTANEOUS | Status: DC

## 2015-07-15 MED ORDER — CEFAZOLIN SODIUM-DEXTROSE 2-3 GM-% IV SOLR
2.0000 g | INTRAVENOUS | Status: AC
  Administered 2015-07-15: 2 g via INTRAVENOUS

## 2015-07-15 MED ORDER — ACETAMINOPHEN 650 MG RE SUPP
650.0000 mg | RECTAL | Status: DC | PRN
  Filled 2015-07-15: qty 1

## 2015-07-15 MED ORDER — CEFAZOLIN SODIUM-DEXTROSE 2-3 GM-% IV SOLR
INTRAVENOUS | Status: AC
  Filled 2015-07-15: qty 50

## 2015-07-15 MED ORDER — PROPOFOL 10 MG/ML IV BOLUS
INTRAVENOUS | Status: AC
  Filled 2015-07-15: qty 20

## 2015-07-15 MED ORDER — EPHEDRINE SULFATE 50 MG/ML IJ SOLN
INTRAMUSCULAR | Status: AC
  Filled 2015-07-15: qty 1

## 2015-07-15 MED ORDER — EPHEDRINE SULFATE 50 MG/ML IJ SOLN
INTRAMUSCULAR | Status: DC | PRN
  Administered 2015-07-15: 5 mg via INTRAVENOUS
  Administered 2015-07-15: 10 mg via INTRAVENOUS
  Administered 2015-07-15: 5 mg via INTRAVENOUS

## 2015-07-15 MED ORDER — HYDROCODONE-ACETAMINOPHEN 5-325 MG PO TABS: 1.0000 | ORAL_TABLET | ORAL | Status: DC | PRN

## 2015-07-15 MED ORDER — SODIUM CHLORIDE 0.9 % IV SOLN: 250.0000 mL | INTRAVENOUS | Status: DC | PRN

## 2015-07-15 MED ORDER — ONDANSETRON HCL 4 MG/2ML IJ SOLN
INTRAMUSCULAR | Status: AC
  Filled 2015-07-15: qty 2

## 2015-07-15 MED ORDER — FENTANYL CITRATE (PF) 250 MCG/5ML IJ SOLN
INTRAMUSCULAR | Status: AC
  Filled 2015-07-15: qty 25

## 2015-07-15 MED ORDER — DEXAMETHASONE SODIUM PHOSPHATE 10 MG/ML IJ SOLN
INTRAMUSCULAR | Status: DC | PRN
  Administered 2015-07-15: 10 mg via INTRAVENOUS

## 2015-07-15 MED ORDER — 0.9 % SODIUM CHLORIDE (POUR BTL) OPTIME
TOPICAL | Status: DC | PRN
  Administered 2015-07-15: 1000 mL

## 2015-07-15 MED ORDER — PROPOFOL 10 MG/ML IV BOLUS
INTRAVENOUS | Status: DC | PRN
  Administered 2015-07-15: 200 mg via INTRAVENOUS

## 2015-07-15 MED ORDER — FENTANYL CITRATE (PF) 100 MCG/2ML IJ SOLN
INTRAMUSCULAR | Status: DC | PRN
  Administered 2015-07-15 (×2): 25 ug via INTRAVENOUS
  Administered 2015-07-15: 50 ug via INTRAVENOUS
  Administered 2015-07-15: 25 ug via INTRAVENOUS
  Administered 2015-07-15 (×2): 50 ug via INTRAVENOUS
  Administered 2015-07-15: 25 ug via INTRAVENOUS

## 2015-07-15 MED ORDER — SODIUM CHLORIDE 0.9 % IJ SOLN
3.0000 mL | INTRAMUSCULAR | Status: DC | PRN
Start: 2015-07-15 — End: 2015-07-15

## 2015-07-15 MED ORDER — DEXAMETHASONE SODIUM PHOSPHATE 10 MG/ML IJ SOLN
INTRAMUSCULAR | Status: AC
  Filled 2015-07-15: qty 1

## 2015-07-15 MED ORDER — ACETAMINOPHEN 325 MG PO TABS: 650.0000 mg | ORAL_TABLET | ORAL | Status: DC | PRN

## 2015-07-15 MED ORDER — LACTATED RINGERS IV SOLN
INTRAVENOUS | Status: DC | PRN
  Administered 2015-07-15: 07:00:00 via INTRAVENOUS

## 2015-07-15 MED ORDER — ONDANSETRON HCL 4 MG/2ML IJ SOLN
INTRAMUSCULAR | Status: DC | PRN
  Administered 2015-07-15: 4 mg via INTRAVENOUS

## 2015-07-15 MED ORDER — HEPARIN SODIUM (PORCINE) 5000 UNIT/ML IJ SOLN
5000.0000 [IU] | Freq: Once | INTRAMUSCULAR | Status: AC
  Administered 2015-07-15: 5000 [IU] via SUBCUTANEOUS
  Filled 2015-07-15: qty 1

## 2015-07-15 MED ORDER — ROCURONIUM BROMIDE 100 MG/10ML IV SOLN
INTRAVENOUS | Status: AC
  Filled 2015-07-15: qty 1

## 2015-07-15 MED ORDER — PHENYLEPHRINE HCL 10 MG/ML IJ SOLN
INTRAMUSCULAR | Status: DC | PRN
  Administered 2015-07-15 (×2): 80 ug via INTRAVENOUS
  Administered 2015-07-15: 40 ug via INTRAVENOUS

## 2015-07-15 MED ORDER — LIDOCAINE HCL (CARDIAC) 20 MG/ML IV SOLN
INTRAVENOUS | Status: DC | PRN
  Administered 2015-07-15: 100 mg via INTRAVENOUS

## 2015-07-15 MED ORDER — SODIUM CHLORIDE 0.9 % IJ SOLN: 3.0000 mL | Freq: Two times a day (BID) | INTRAMUSCULAR | Status: DC

## 2015-07-15 MED ORDER — FENTANYL CITRATE (PF) 100 MCG/2ML IJ SOLN
25.0000 ug | INTRAMUSCULAR | Status: DC | PRN
  Administered 2015-07-15: 50 ug via INTRAVENOUS

## 2015-07-15 MED ORDER — BUPIVACAINE-EPINEPHRINE (PF) 0.25% -1:200000 IJ SOLN
INTRAMUSCULAR | Status: AC
  Filled 2015-07-15: qty 30

## 2015-07-15 MED ORDER — LIDOCAINE HCL (CARDIAC) 20 MG/ML IV SOLN
INTRAVENOUS | Status: AC
  Filled 2015-07-15: qty 5

## 2015-07-15 MED ORDER — PHENYLEPHRINE 40 MCG/ML (10ML) SYRINGE FOR IV PUSH (FOR BLOOD PRESSURE SUPPORT)
PREFILLED_SYRINGE | INTRAVENOUS | Status: AC
  Filled 2015-07-15: qty 10

## 2015-07-15 MED ORDER — BUPIVACAINE LIPOSOME 1.3 % IJ SUSP
20.0000 mL | Freq: Once | INTRAMUSCULAR | Status: AC
  Administered 2015-07-15: 20 mL
  Filled 2015-07-15: qty 20

## 2015-07-15 MED ORDER — SODIUM CHLORIDE 0.9 % IJ SOLN
INTRAMUSCULAR | Status: AC
  Filled 2015-07-15: qty 10

## 2015-07-15 MED ORDER — OXYCODONE HCL 5 MG PO TABS
5.0000 mg | ORAL_TABLET | ORAL | Status: DC | PRN
  Administered 2015-07-15: 5 mg via ORAL
  Filled 2015-07-15: qty 1

## 2015-07-15 MED ORDER — PROMETHAZINE HCL 25 MG/ML IJ SOLN: 6.2500 mg | INTRAMUSCULAR | Status: DC | PRN

## 2015-07-15 SURGICAL SUPPLY — 36 items
BENZOIN TINCTURE PRP APPL 2/3 (GAUZE/BANDAGES/DRESSINGS) IMPLANT
BLADE HEX COATED 2.75 (ELECTRODE) ×3 IMPLANT
BLADE SURG 15 STRL LF DISP TIS (BLADE) ×1 IMPLANT
BLADE SURG 15 STRL SS (BLADE) ×2
CLOSURE WOUND 1/2 X4 (GAUZE/BANDAGES/DRESSINGS)
COVER SURGICAL LIGHT HANDLE (MISCELLANEOUS) ×3 IMPLANT
DECANTER SPIKE VIAL GLASS SM (MISCELLANEOUS) IMPLANT
DISSECTOR ROUND CHERRY 3/8 STR (MISCELLANEOUS) IMPLANT
DRAIN PENROSE 18X1/2 LTX STRL (DRAIN) ×3 IMPLANT
DRAPE LAPAROTOMY TRNSV 102X78 (DRAPE) ×3 IMPLANT
ELECT PENCIL ROCKER SW 15FT (MISCELLANEOUS) ×3 IMPLANT
ELECT REM PT RETURN 9FT ADLT (ELECTROSURGICAL) ×3
ELECTRODE REM PT RTRN 9FT ADLT (ELECTROSURGICAL) ×1 IMPLANT
GLOVE BIOGEL M 8.0 STRL (GLOVE) ×3 IMPLANT
GOWN STRL REUS W/TWL XL LVL3 (GOWN DISPOSABLE) ×9 IMPLANT
KIT BASIN OR (CUSTOM PROCEDURE TRAY) ×3 IMPLANT
LIQUID BAND (GAUZE/BANDAGES/DRESSINGS) ×3 IMPLANT
MESH HERNIA 1X4 RECT BARD (Mesh General) ×1 IMPLANT
MESH HERNIA BARD 1X4 (Mesh General) ×2 IMPLANT
NEEDLE HYPO 22GX1.5 SAFETY (NEEDLE) ×3 IMPLANT
PACK BASIC VI WITH GOWN DISP (CUSTOM PROCEDURE TRAY) ×3 IMPLANT
SPONGE LAP 4X18 X RAY DECT (DISPOSABLE) ×3 IMPLANT
STAPLER VISISTAT 35W (STAPLE) IMPLANT
STRIP CLOSURE SKIN 1/2X4 (GAUZE/BANDAGES/DRESSINGS) IMPLANT
SUT PROLENE 2 0 CT2 30 (SUTURE) ×9 IMPLANT
SUT SILK 2 0 SH (SUTURE) ×3 IMPLANT
SUT VIC AB 2-0 SH 27 (SUTURE) ×2
SUT VIC AB 2-0 SH 27X BRD (SUTURE) ×1 IMPLANT
SUT VIC AB 4-0 PS2 18 (SUTURE) ×3 IMPLANT
SUT VIC AB 4-0 SH 18 (SUTURE) ×3 IMPLANT
SUT VICRYL 4-0 (SUTURE) ×3 IMPLANT
SYR 20CC LL (SYRINGE) ×3 IMPLANT
SYR BULB IRRIGATION 50ML (SYRINGE) ×3 IMPLANT
TOWEL OR 17X26 10 PK STRL BLUE (TOWEL DISPOSABLE) ×3 IMPLANT
TOWEL OR NON WOVEN STRL DISP B (DISPOSABLE) ×3 IMPLANT
YANKAUER SUCT BULB TIP 10FT TU (MISCELLANEOUS) ×3 IMPLANT

## 2015-07-15 NOTE — Brief Op Note (Signed)
07/15/2015  9:08 AM  PATIENT:  [name]  74 y.o. male  PRE-OPERATIVE DIAGNOSIS:  LEFT INGUINAL HERNIA  POST-OPERATIVE DIAGNOSIS:  LEFT INGUINAL HERNIA  PROCEDURE:  Procedure(s): OPEN LEFT INGUINAL HERNIA REPAIR (Left)  SURGEON:  Surgeon(s) and Role:    * Johnathan Hausen, MD - Primary  PHYSICIAN ASSISTANT:   ASSISTANTS: none   ANESTHESIA:   general  EBL:  Total I/O In: 600 [I.V.:600] Out: -   BLOOD ADMINISTERED:none  DRAINS: none   LOCAL MEDICATIONS USED:  BUPIVICAINE   SPECIMEN:  No Specimen  DISPOSITION OF SPECIMEN:  N/A  COUNTS:  YES  TOURNIQUET:  * No tourniquets in log *  DICTATION: .Other Dictation: Dictation Number [id number]  PLAN OF CARE: Discharge to home after PACU  PATIENT DISPOSITION:  PACU - hemodynamically stable.   Delay start of Pharmacological VTE agent (>24hrs) due to surgical blood loss or risk of bleeding: not applicable

## 2015-07-15 NOTE — Anesthesia Preprocedure Evaluation (Addendum)
Anesthesia Evaluation  Patient identified by MRN, date of birth, ID band Patient awake    Reviewed: Allergy & Precautions, NPO status , Patient's Chart, lab work & pertinent test results  Airway Mallampati: II  TM Distance: >3 FB Neck ROM: Full    Dental no notable dental hx.    Pulmonary Current Smoker,    Pulmonary exam normal breath sounds clear to auscultation       Cardiovascular negative cardio ROS Normal cardiovascular exam Rhythm:Regular Rate:Normal     Neuro/Psych negative neurological ROS  negative psych ROS   GI/Hepatic negative GI ROS, Neg liver ROS,   Endo/Other  negative endocrine ROS  Renal/GU negative Renal ROS  negative genitourinary   Musculoskeletal negative musculoskeletal ROS (+)   Abdominal   Peds negative pediatric ROS (+)  Hematology negative hematology ROS (+)   Anesthesia Other Findings   Reproductive/Obstetrics negative OB ROS                            Anesthesia Physical Anesthesia Plan  ASA: III  Anesthesia Plan: General   Post-op Pain Management:    Induction: Intravenous  Airway Management Planned: LMA  Additional Equipment:   Intra-op Plan:   Post-operative Plan: Extubation in OR  Informed Consent: I have reviewed the patients History and Physical, chart, labs and discussed the procedure including the risks, benefits and alternatives for the proposed anesthesia with the patient or authorized representative who has indicated his/her understanding and acceptance.   Dental advisory given  Plan Discussed with: CRNA  Anesthesia Plan Comments:        Anesthesia Quick Evaluation

## 2015-07-15 NOTE — Interval H&P Note (Signed)
History and Physical Interval Note:  07/15/2015 7:25 AM  Gabriel [name]  has presented today for surgery, with the diagnosis of LEFT INGUINAL HERNIA  The various methods of treatment have been discussed with the patient and family. After consideration of risks, benefits and other options for treatment, the patient has consented to  Procedure(s): OPEN LEFT INGUINAL HERNIA REPAIR (Left) as a surgical intervention .  The patient's history has been reviewed, patient examined, no change in status, stable for surgery.  I have reviewed the patient's chart and labs.  Questions were answered to the patient's satisfaction.     MARTIN,MATTHEW B

## 2015-07-15 NOTE — Anesthesia Postprocedure Evaluation (Signed)
  Anesthesia Post-op Note  Patient: Gabriel [name]  Procedure(s) Performed: Procedure(s) (LRB): OPEN LEFT INGUINAL HERNIA REPAIR (Left)  Patient Location: PACU  Anesthesia Type: General  Level of Consciousness: awake and alert   Airway and Oxygen Therapy: Patient Spontanous Breathing  Post-op Pain: mild  Post-op Assessment: Post-op Vital signs reviewed, Patient's Cardiovascular Status Stable, Respiratory Function Stable, Patent Airway and No signs of Nausea or vomiting  Last Vitals:  Filed Vitals:   07/15/15 1000  BP: 106/51  Pulse: 98  Temp:   Resp: 20    Post-op Vital Signs: stable   Complications: No apparent anesthesia complications

## 2015-07-15 NOTE — Progress Notes (Signed)
Gabriel [name] up and walked hall without difficulty.  Pt tolerated well.  He then voided moderate amount without difficulty.

## 2015-07-15 NOTE — H&P (View-Only) (Signed)
Chief Complaint:  Left inguinal hernia  History of Present Illness:  Gabriel [name] is an 74 y.o. male who was seen in the office regarding a new left inguinal hernia (I had previously repaired a right inguinal hernia).  He had just received a dx of lung cancer and was beginning chemotherapy.  He called the office and wanted to have this repaired between chemotherapy sessions.  He must have failed the truss treatment.   Past Medical History  Diagnosis Date  . Spontaneous pneumothorax     HISTORY OF (ONLY)  . Cancer     skin cancer  . Lung mass     right  . Inguinal hernia     Past Surgical History  Procedure Laterality Date  . Hernia repair  2011  . Colonoscopy w/ biopsies and polypectomy    . Wisdom tooth extraction    . Tonsillectomy    . Video bronchoscopy with endobronchial ultrasound N/A 04/18/2015    Procedure: VIDEO BRONCHOSCOPY WITH ENDOBRONCHIAL ULTRASOUND;  Surgeon: Grace Isaac, MD;  Location: [facility] OR;  Service: Thoracic;  Laterality: N/A;    Current Outpatient Prescriptions  Medication Sig Dispense Refill  . albuterol (PROVENTIL HFA;VENTOLIN HFA) 108 (90 BASE) MCG/ACT inhaler Inhale 1-2 puffs into the lungs every 6 (six) hours as needed for wheezing or shortness of breath. 1 Inhaler 0  . Ascorbic Acid (VITAMIN C) 1000 MG tablet Take 1,000 mg by mouth daily.    Marland Kitchen b complex vitamins tablet Take 1 tablet by mouth daily.    . calcium-vitamin D (OSCAL WITH D) 500-200 MG-UNIT per tablet Take 1 tablet by mouth daily.    . Cyanocobalamin (VITAMIN B 12 PO) Take 1 tablet by mouth daily.    Marland Kitchen docusate sodium (COLACE) 100 MG capsule Take 100 mg by mouth daily.    Marland Kitchen emollient (BIAFINE) cream Apply 90 g topically 2 (two) times daily.    Marland Kitchen L-Arginine 1000 MG TABS Take 1,000 mg by mouth daily.    . magnesium 30 MG tablet Take 30 mg by mouth daily.    . Multiple Vitamin (MULTI VITAMIN DAILY PO) Take 1 tablet by mouth daily.    . Omega-3 Fatty Acids (FISH OIL) 1000 MG CAPS Take  1,000 mg by mouth daily.    . potassium chloride (K-DUR) 10 MEQ tablet Take 10 mEq by mouth daily.    . prochlorperazine (COMPAZINE) 10 MG tablet Take 1 tablet (10 mg total) by mouth every 6 (six) hours as needed for nausea or vomiting. (Patient not taking: Reported on 06/13/2015) 30 tablet 0  . vitamin A 10000 UNIT capsule Take 10,000 Units by mouth daily.     No current facility-administered medications for this visit.   Aleve Family History  Problem Relation Age of Onset  . Heart attack Father   . Hypertension Mother   . Heart disease Father    Social History:   reports that he has been smoking Cigarettes.  He has a 27.5 pack-year smoking history. He has never used smokeless tobacco. He reports that he drinks alcohol. He reports that he does not use illicit drugs.   REVIEW OF SYSTEMS : Negative except for see problem list  Physical Exam:   There were no vitals taken for this visit. There is no weight on file to calculate BMI.  Gen:  WDWN WM NAD  Neurological: Alert and oriented to person, place, and time. Motor and sensory function is grossly intact  Head: Normocephalic and atraumatic.  Eyes: Conjunctivae  are normal. Pupils are equal, round, and reactive to light. No scleral icterus.  Neck: Normal range of motion. Neck supple. No tracheal deviation or thyromegaly present.  Cardiovascular:  SR without murmurs or gallops.  No carotid bruits Breast:  Not examined Respiratory: Effort normal.  No respiratory distress. No chest wall tenderness. Breath sounds normal.  No wheezes, rales or rhonchi.  Abdomen:  nontender GU:  Prior open RIH; now with reducible left inguinal hernia Musculoskeletal: Normal range of motion. Extremities are nontender. No cyanosis, edema or clubbing noted Lymphadenopathy: No cervical, preauricular, postauricular or axillary adenopathy is present Skin: Skin is warm and dry. No rash noted. No diaphoresis. No erythema. No pallor. Pscyh: Normal mood and affect.  Behavior is normal. Judgment and thought content normal.   LABORATORY RESULTS: No results found for this or any previous visit (from the past 48 hour(s)).   RADIOLOGY RESULTS: No results found.  Problem List: Patient Active Problem List   Diagnosis Date Noted  . Encounter for antineoplastic chemotherapy 05/09/2015  . Non-small cell carcinoma of right lung, stage 3 04/21/2015  . Lung mass 04/18/2015  . Pneumothorax 04/04/2015  . Pneumothorax on right 04/04/2015  . Hemoptysis, unspecified 10/07/2013    Assessment & Plan: Left inguinal hernia Open left inguinal hernia    Matt B. Hassell Done, MD, [facility] [phone] beeper [phone]  07/01/2015 3:43 PM    .

## 2015-07-15 NOTE — Anesthesia Procedure Notes (Signed)
Procedure Name: LMA Insertion Date/Time: 07/15/2015 7:37 AM Performed by: Maxwell Caul Pre-anesthesia Checklist: Patient identified, Emergency Drugs available, Suction available and Patient being monitored Patient Re-evaluated:Patient Re-evaluated prior to inductionOxygen Delivery Method: Circle system utilized Preoxygenation: Pre-oxygenation with 100% oxygen Intubation Type: IV induction LMA: LMA inserted LMA Size: 4.0 Number of attempts: 1 Placement Confirmation: positive ETCO2,  CO2 detector and breath sounds checked- equal and bilateral Tube secured with: Tape Dental Injury: Teeth and Oropharynx as per pre-operative assessment

## 2015-07-15 NOTE — Progress Notes (Signed)
Dr. Hassell Done talked with patient.

## 2015-07-15 NOTE — Transfer of Care (Signed)
Immediate Anesthesia Transfer of Care Note  Patient: Gabriel [name]  Procedure(s) Performed: Procedure(s): OPEN LEFT INGUINAL HERNIA REPAIR (Left)  Patient Location: PACU  Anesthesia Type:General  Level of Consciousness:  sedated, patient cooperative and responds to stimulation  Airway & Oxygen Therapy:Patient Spontanous Breathing and Patient connected to face mask oxgen  Post-op Assessment:  Report given to PACU RN and Post -op Vital signs reviewed and stable  Post vital signs:  Reviewed and stable  Last Vitals:  Filed Vitals:   07/15/15 0529  BP: 135/82  Pulse: 103  Temp: 36.4 C  Resp: 18    Complications: No apparent anesthesia complications

## 2015-07-15 NOTE — Op Note (Signed)
NAMEROCKLIN, SODERQUIST NO.:  [id number]  MEDICAL RECORD NO.:  [id number]  LOCATION:  [facility]                         FACILITY:  [facility]  PHYSICIAN:  Isabel Caprice. Hassell Done, MD  DATE OF BIRTH:  February 22, 1941  DATE OF PROCEDURE: DATE OF DISCHARGE:                              OPERATIVE REPORT   PREOPERATIVE DIAGNOSIS:  Symptomatic painful left inguinal hernia who has failed TRUS therapy.  POSTOPERATIVE DIAGNOSIS:  Symptomatic painful left inguinal hernia.  PROCEDURE:  Open left inguinal herniorrhaphy with mesh.  FINDINGS:  Left indirect inguinal hernia with a direct component as well.  SURGEON:  Isabel Caprice. Hassell Done, MD.  ASSISTANT:  None.  ANESTHESIA:  General by LMA.  DESCRIPTION OF PROCEDURE:  Gabriel [name] was taken to room 4 and given general anesthesia.  Left side has previously been marked.  After prepping completely with PCMX and after a time out, I made an incision in the left groin obliquely.  I carried this down to the external oblique which I incised.  His external oblique fibers had separated somewhat, and I went ahead and opened in that area to expose the cord structures.  I mobilized the cord.  In the anteromedial location, I found an indirect sac which was dissected free from the cord structures, and performed a high ligation after opening it, putting my finger in, and then identifying small bowel.  I did a pursestring suture of 2-0 silk at the neck, tucked everything and twisted it, ligated, and it was returned into the abdomen.  I put a single Prolene suture medially to snug up the internal ring.  I then cut a piece of 1 x 4 mesh, Marlex type and sutured it to the inguinal ligament with a running 2-0 Prolene. I sutured that medially as well and took it around the cord and sutured it to itself with a single 2-0 Prolene.  External oblique was closed with running 2-0 Vicryl.  The entire area was infiltrated with 20 mL of Exparel.  It was then closed with 4-0  Vicryl, subcutaneously with a running 4-0 Vicryl and LiquiBand.  The patient tolerated the procedure well.  He was taken to the recovery room in satisfactory condition.    Isabel Caprice Hassell Done, MD    MBM/MEDQ  D:  07/15/2015  T:  07/15/2015  Job:  [id number]  cc:   Eilleen Kempf, M.D.

## 2015-07-15 NOTE — Discharge Instructions (Signed)
Inguinal Hernia, Adult  Care After Refer to this sheet in the next few weeks. These discharge instructions provide you with general information on caring for yourself after you leave the hospital. Your caregiver may also give you specific instructions. Your treatment has been planned according to the most current medical practices available, but unavoidable complications sometimes occur. If you have any problems or questions after discharge, please call your caregiver. HOME CARE INSTRUCTIONS  Put ice on the operative site.  Put ice in a plastic bag.  Place a towel between your skin and the bag.  Leave the ice on for 15-20 minutes at a time, 03-04 times a day while awake.  Change bandages (dressings) as directed.  Keep the wound dry and clean. The wound may be washed gently with soap and water. Gently blot or dab the wound dry. It is okay to take showers 24 to 48 hours after surgery. Do not take baths, use swimming pools, or use hot tubs for 10 days, or as directed by your caregiver.  Only take over-the-counter or prescription medicines for pain, discomfort, or fever as directed by your caregiver.  Continue your normal diet as directed.  Do not lift anything more than 10 pounds or play contact sports for 3 weeks, or as directed. SEEK MEDICAL CARE IF:  There is redness, swelling, or increasing pain in the wound.  There is fluid (pus) coming from the wound.  There is drainage from a wound lasting longer than 1 day.  You have an oral temperature above 102 F (38.9 C).  You notice a bad smell coming from the wound or dressing.  The wound breaks open after the stitches (sutures) have been removed.  You notice increasing pain in the shoulders (shoulder strap areas).  You develop dizzy episodes or fainting while standing.  You feel sick to your stomach (nauseous) or throw up (vomit). SEEK IMMEDIATE MEDICAL CARE IF:  You develop a rash.  You have difficulty breathing.  You  develop a reaction or have side effects to medicines you were given. MAKE SURE YOU:   Understand these instructions.  Will watch your condition.  Will get help right away if you are not doing well or get worse. Document Released: 11/15/2006 Document Revised: 01/07/2012 Document Reviewed: 09/14/2009 [facility] Patient Information 2015 [place], [place]. This information is not intended to replace advice given to you by your health care provider. Make sure you discuss any questions you have with your health care provider.    General Anesthesia, Care After Refer to this sheet in the next few weeks. These instructions provide you with information on caring for yourself after your procedure. Your health care provider may also give you more specific instructions. Your treatment has been planned according to current medical practices, but problems sometimes occur. Call your health care provider if you have any problems or questions after your procedure. WHAT TO EXPECT AFTER THE PROCEDURE After the procedure, it is typical to experience:  Sleepiness.  Nausea and vomiting. HOME CARE INSTRUCTIONS  For the first 24 hours after general anesthesia:  Have a responsible person with you.  Do not drive a car. If you are alone, do not take public transportation.  Do not drink alcohol.  Do not take medicine that has not been prescribed by your health care provider.  Do not sign important papers or make important decisions.  You may resume a normal diet and activities as directed by your health care provider.  Change bandages (dressings) as directed.  If you have questions or problems that seem related to general anesthesia, call the hospital and ask for the anesthetist or anesthesiologist on call. SEEK MEDICAL CARE IF:  You have nausea and vomiting that continue the day after anesthesia.  You develop a rash. SEEK IMMEDIATE MEDICAL CARE IF:   You have difficulty breathing.  You have chest  pain.  You have any allergic problems. Document Released: 01/21/2001 Document Revised: 10/20/2013 Document Reviewed: 04/30/2013 [facility] Patient Information 2015 [place], [place]. This information is not intended to replace advice given to you by your health care provider. Make sure you discuss any questions you have with your health care provider.

## 2015-07-22 ENCOUNTER — Ambulatory Visit (HOSPITAL_COMMUNITY)
Admission: RE | Admit: 2015-07-22 | Discharge: 2015-07-22 | Disposition: A | Discharge status: Home / Self Care | Payer: 59 | Source: Ambulatory Visit | Attending: Internal Medicine | Admitting: Internal Medicine

## 2015-07-22 DIAGNOSIS — R918 Other nonspecific abnormal finding of lung field: Secondary | ICD-10-CM | POA: Insufficient documentation

## 2015-07-22 DIAGNOSIS — C3491 Malignant neoplasm of unspecified part of right bronchus or lung: Secondary | ICD-10-CM | POA: Diagnosis present

## 2015-07-22 DIAGNOSIS — Z5111 Encounter for antineoplastic chemotherapy: Secondary | ICD-10-CM

## 2015-07-22 DIAGNOSIS — J439 Emphysema, unspecified: Secondary | ICD-10-CM | POA: Diagnosis not present

## 2015-07-22 DIAGNOSIS — I2510 Atherosclerotic heart disease of native coronary artery without angina pectoris: Secondary | ICD-10-CM | POA: Diagnosis not present

## 2015-07-22 MED ORDER — IOHEXOL 300 MG/ML  SOLN
75.0000 mL | Freq: Once | INTRAMUSCULAR | Status: AC | PRN
  Administered 2015-07-22: 75 mL via INTRAVENOUS

## 2015-07-25 ENCOUNTER — Telehealth: Payer: Self-pay | Admitting: Internal Medicine

## 2015-07-25 ENCOUNTER — Other Ambulatory Visit (HOSPITAL_BASED_OUTPATIENT_CLINIC_OR_DEPARTMENT_OTHER): Payer: 59

## 2015-07-25 ENCOUNTER — Ambulatory Visit (HOSPITAL_BASED_OUTPATIENT_CLINIC_OR_DEPARTMENT_OTHER): Payer: 59 | Admitting: Internal Medicine

## 2015-07-25 ENCOUNTER — Encounter: Payer: Self-pay | Admitting: Internal Medicine

## 2015-07-25 ENCOUNTER — Encounter: Payer: Self-pay | Admitting: *Deleted

## 2015-07-25 VITALS — BP 158/80 | HR 96 | Temp 97.6°F | Resp 17 | Ht 70.0 in | Wt 132.4 lb

## 2015-07-25 DIAGNOSIS — C3491 Malignant neoplasm of unspecified part of right bronchus or lung: Secondary | ICD-10-CM

## 2015-07-25 DIAGNOSIS — C3431 Malignant neoplasm of lower lobe, right bronchus or lung: Secondary | ICD-10-CM

## 2015-07-25 DIAGNOSIS — C342 Malignant neoplasm of middle lobe, bronchus or lung: Secondary | ICD-10-CM

## 2015-07-25 DIAGNOSIS — Z5111 Encounter for antineoplastic chemotherapy: Secondary | ICD-10-CM

## 2015-07-25 LAB — COMPREHENSIVE METABOLIC PANEL (CC13)
ALT: 20 U/L (ref 0–55)
ANION GAP: 9 meq/L (ref 3–11)
AST: 18 U/L (ref 5–34)
Albumin: 3.8 g/dL (ref 3.5–5.0)
Alkaline Phosphatase: 98 U/L (ref 40–150)
BILIRUBIN TOTAL: 0.34 mg/dL (ref 0.20–1.20)
BUN: 19.2 mg/dL (ref 7.0–26.0)
CHLORIDE: 104 meq/L (ref 98–109)
CO2: 25 meq/L (ref 22–29)
Calcium: 9.7 mg/dL (ref 8.4–10.4)
Creatinine: 0.9 mg/dL (ref 0.7–1.3)
EGFR: 84 mL/min/{1.73_m2} — AB (ref >90)
Glucose: 111 mg/dl (ref 70–140)
Potassium: 4.2 mEq/L (ref 3.5–5.1)
Sodium: 138 mEq/L (ref 136–145)
Total Protein: 7.4 g/dL (ref 6.4–8.3)

## 2015-07-25 LAB — CBC WITH DIFFERENTIAL/PLATELET
BASO%: 1 % (ref 0.0–2.0)
BASOS ABS: 0.1 10*3/uL (ref 0.0–0.1)
EOS ABS: 0.1 10*3/uL (ref 0.0–0.5)
EOS%: 0.7 % (ref 0.0–7.0)
HCT: 43.7 % (ref 38.4–49.9)
HGB: 14.7 g/dL (ref 13.0–17.1)
LYMPH%: 8.4 % — AB (ref 14.0–49.0)
MCH: 30.9 pg (ref 27.2–33.4)
MCHC: 33.7 g/dL (ref 32.0–36.0)
MCV: 91.6 fL (ref 79.3–98.0)
MONO#: 0.9 10*3/uL (ref 0.1–0.9)
MONO%: 9.2 % (ref 0.0–14.0)
NEUT#: 8.3 10*3/uL — ABNORMAL HIGH (ref 1.5–6.5)
NEUT%: 80.7 % — AB (ref 39.0–75.0)
PLATELETS: 298 10*3/uL (ref 140–400)
RBC: 4.77 10*6/uL (ref 4.20–5.82)
RDW: 18.1 % — ABNORMAL HIGH (ref 11.0–14.6)
WBC: 10.3 10*3/uL (ref 4.0–10.3)
lymph#: 0.9 10*3/uL (ref 0.9–3.3)

## 2015-07-25 NOTE — Telephone Encounter (Signed)
Gave and printed appt sched and avs for pt for Sept thru [facility]

## 2015-07-25 NOTE — Progress Notes (Signed)
Oncology Nurse Navigator Documentation  Oncology Nurse Navigator Flowsheets 07/25/2015  Navigator Encounter Type Treatment/spoke with patient and family today at [facility].  Patient is post hernia repair and is on tx.  He had follow up scans today with good results.  He will have tx in 2 weeks. All questions and concerns addressed.   Patient Visit Type Follow-up  Treatment Phase -  Barriers/Navigation Needs No barriers at this time  Interventions -  Time Spent with Patient 15

## 2015-07-25 NOTE — Progress Notes (Signed)
[place] Telephone:([phone]) [phone]   [phone]  OFFICE PROGRESS NOTE  No PCP Per Patient No address on file  DIAGNOSIS: Non-small cell carcinoma of right lung, stage 3  Staging form: Lung, AJCC 7th Edition  Clinical stage from 04/21/2015: Stage IIIA (T2a, N2, M0) - Signed by Curt Bears, MD on 04/21/2015  Foundation One molecular studies reveal that he was positive for [place], [place], and STAG2 [id number]. He was negative for: EGFR, ALK, BRAF, MET, RET, ERBB2 and ROS1  PRIOR THERAPY: Concurrent chemoradiation with chemotherapy in the form of weekly carboplatin for AUC 2 paclitaxel 45 mg/m given concurrent with radiation therapy. First cycle started 05/09/2015. Status post 7 cycles.  CURRENT THERAPY: Consolidation chemotherapy with carboplatin for AUC of 5 on day 1 and gemcitabine 1000 MG/M2 on days 1 and 8 every 3 weeks. First dose 08/15/2015.  INTERVAL HISTORY: Gabriel [id number].[id number]. male returns to the clinic today for follow-up visit accompanied by his daughter Gabriel Signsand [name] The patient completed a course of concurrent chemoradiation with weekly carboplatin and paclitaxel status post 7 cycles. He tolerated his treatment fairly well with no significant adverse effects. He denied having any significant weight loss or night sweats.. He denied having any significant nausea or vomiting, no fever or chills. He denied having any sore throat, dysphagia or odynophagia. He has no chest pain, shortness of breath, cough or hemoptysis. The patient recently had hernia repair by Dr. MHassell Done He had repeat CT scan of the chest performed recently and he is here for evaluation and discussion of his scan results.  MEDICAL HISTORY: Past Medical History  Diagnosis Date  . Spontaneous pneumothorax MANY YRS AGO AND AGAIN 04/04/15    HISTORY OF (ONLY)  . Cancer     skin cancer  . Lung mass     right / HAS HAD RADIATION AND CHEMO FOR LUNG CANCER   . Inguinal hernia   . Productive cough     "DUE TO RADIATION"  . History of nonmelanoma skin cancer   . Constipation     AFTER ANESTHESIA  . Difficulty sleeping     ALLERGIES:  is allergic to aleve.  MEDICATIONS:  Current Outpatient Prescriptions  Medication Sig Dispense Refill  . albuterol (PROVENTIL HFA;VENTOLIN HFA) 108 (90 BASE) MCG/ACT inhaler Inhale 1-2 puffs into the lungs every 6 (six) hours as needed for wheezing or shortness of breath. 1 Inhaler 0  . amoxicillin-clavulanate (AUGMENTIN) 875-125 MG per tablet Take 1 tablet by mouth 2 (two) times daily.  0  . Ascorbic Acid (VITAMIN C) 1000 MG tablet Take 1,000 mg by mouth daily.    .Marland Kitchenb complex vitamins tablet Take 1 tablet by mouth daily.    . calcium-vitamin D (OSCAL WITH D) 500-200 MG-UNIT per tablet Take 1 tablet by mouth daily.    . Cyanocobalamin (VITAMIN B 12 PO) Take 1 tablet by mouth daily.    .Marland Kitchendocusate sodium (COLACE) 100 MG capsule Take 100 mg by mouth daily.    .Marland Kitchenemollient (BIAFINE) cream Apply 90 g topically 2 (two) times daily.    .Marland KitchenHYDROcodone-acetaminophen (NORCO) 5-325 MG per tablet Take 1 tablet by mouth every 4 (four) hours as needed for moderate pain. 30 tablet 0  . L-Arginine 1000 MG TABS Take 1,000 mg by mouth daily.    . magnesium 30 MG tablet Take 30 mg by mouth daily.    . Multiple Vitamin (MULTI VITAMIN DAILY PO) Take  1 tablet by mouth daily.    . Omega-3 Fatty Acids (FISH OIL) 1000 MG CAPS Take 1,000 mg by mouth daily.    . potassium chloride (K-DUR) 10 MEQ tablet Take 10 mEq by mouth daily.    Marland Kitchen PRESCRIPTION MEDICATION CHEMO [facility]    . prochlorperazine (COMPAZINE) 10 MG tablet Take 1 tablet (10 mg total) by mouth every 6 (six) hours as needed for nausea or vomiting. 30 tablet 0  . vitamin A 10000 UNIT capsule Take 10,000 Units by mouth daily.     No current facility-administered medications for this visit.    SURGICAL HISTORY:  Past Surgical History  Procedure Laterality Date  . Hernia repair   2011  . Colonoscopy w/ biopsies and polypectomy    . Wisdom tooth extraction    . Tonsillectomy    . Video bronchoscopy with endobronchial ultrasound N/A 04/18/2015    Procedure: VIDEO BRONCHOSCOPY WITH ENDOBRONCHIAL ULTRASOUND;  Surgeon: Grace Isaac, MD;  Location: [facility];  Service: Thoracic;  Laterality: N/A;  . Inguinal hernia repair Left 07/15/2015    Procedure: OPEN LEFT INGUINAL HERNIA REPAIR;  Surgeon: Johnathan Hausen, MD;  Location: [facility];  Service: General;  Laterality: Left;  With MESH    REVIEW OF SYSTEMS:  Constitutional: negative Eyes: negative Ears, nose, mouth, throat, and face: negative Respiratory: negative Cardiovascular: negative Gastrointestinal: negative Genitourinary:negative Integument/breast: negative Hematologic/lymphatic: negative Musculoskeletal:negative Neurological: negative Behavioral/Psych: negative Endocrine: negative Allergic/Immunologic: negative   PHYSICAL EXAMINATION: General appearance: alert, cooperative and no distress Head: Normocephalic, without obvious abnormality, atraumatic Neck: no adenopathy, no JVD, supple, symmetrical, trachea midline and thyroid not enlarged, symmetric, no tenderness/mass/nodules Lymph nodes: Cervical, supraclavicular, and axillary nodes normal. Resp: clear to auscultation bilaterally Back: symmetric, no curvature. ROM normal. No CVA tenderness. Cardio: regular rate and rhythm, S1, S2 normal, no murmur, click, rub or gallop GI: soft, non-tender; bowel sounds normal; no masses,  no organomegaly Extremities: extremities normal, atraumatic, no cyanosis or edema Neurologic: Alert and oriented X 3, normal strength and tone. Normal symmetric reflexes. Normal coordination and gait  ECOG PERFORMANCE STATUS: 1 - Symptomatic but completely ambulatory  There were no vitals taken for this visit.  LABORATORY DATA: Lab Results  Component Value Date   WBC 10.3 07/25/2015   HGB 14.7 07/25/2015   HCT 43.7 07/25/2015    MCV 91.6 07/25/2015   PLT 298 07/25/2015      Chemistry      Component Value Date/Time   NA 138 07/08/2015 0945   NA 140 06/20/2015 1234   K 5.1 07/08/2015 0945   K 4.2 06/20/2015 1234   CL 102 07/08/2015 0945   CO2 28 07/08/2015 0945   CO2 22 06/20/2015 1234   BUN 18 07/08/2015 0945   BUN 22.6 06/20/2015 1234   CREATININE 0.98 07/08/2015 0945   CREATININE 1.0 06/20/2015 1234      Component Value Date/Time   CALCIUM 9.6 07/08/2015 0945   CALCIUM 9.6 06/20/2015 1234   ALKPHOS 72 06/20/2015 1234   ALKPHOS 67 05/18/2015 2136   AST 19 06/20/2015 1234   AST 26 05/18/2015 2136   ALT 18 06/20/2015 1234   ALT 20 05/18/2015 2136   BILITOT 0.38 06/20/2015 1234   BILITOT 0.6 05/18/2015 2136       RADIOGRAPHIC STUDIES: Ct Chest W Contrast  07/22/2015   CLINICAL DATA:  74 year old male with stage IIIA (T2a, N2, M0) lung adenocarcinoma of the right middle/lower lobes diagnosed in June 2016 status post interval concurrent chemoradiation therapy, presenting  for restaging.  EXAM: CT CHEST WITH CONTRAST  TECHNIQUE: Multidetector CT imaging of the chest was performed during intravenous contrast administration.  CONTRAST:  16m OMNIPAQUE IOHEXOL 300 MG/ML  SOLN  COMPARISON:  04/12/2015 PET-CT and 04/05/2015 chest CT.  FINDINGS: Mediastinum/Nodes: Normal heart size. No pericardial fluid/thickening. There is atherosclerosis of the thoracic aorta, the great vessels of the mediastinum and the coronary arteries, including calcified atherosclerotic plaque in the left anterior descending coronary artery. Great vessels are normal in course and caliber. No central pulmonary emboli. Stable 0.7 cm hypodense right thyroid lobe nodule. Normal esophagus. No axillary lymphadenopathy. No pathologically enlarged mediastinal or left hilar lymph nodes.  Lungs/Pleura: No pneumothorax. No pleural effusion. There is a 3.8 x 2.7 cm spiculated lung mass centered in the central right lower/middle lobe (series 2/ image 35),  minimally decreased from 4.0 x 3.0 cm on 04/12/2015. The mass encases the proximal right middle and right lower lobe bronchi, with improved patency/decreased narrowing of these bronchi in the interval. The mass is confluent with right infrahilar lymphadenopathy, unchanged. There is subpleural 1.0 x 1.0 cm lateral apical left upper lobe irregular pulmonary nodule (series 5/image 7), previously 1.2 x 1.1 cm, minimally decreased. A 1.0 x 0.7 cm medial subpleural irregular left upper lobe pulmonary nodule (5/11) is slightly decreased from 1.2 x 0.9 cm. A sub solid 1.5 x 1.1 cm posterior left upper lobe pulmonary nodule (5/15) is decreased from 1.8 x 1.4 cm with decreased solid component now measuring 0.8 cm, previously 1.0 cm. A cavitary sub solid superior segment left lower lobe 2.2 x 2.0 cm pulmonary nodule with 0.6 cm solid component (5/28) is decreased from 2.3 x 2.1 cm with solid component previously 0.9 cm. A sub solid and possibly cavitary 1.3 x 1.0 cm basilar left lower lobe pulmonary nodule (5/50) is not appreciably changed, previously 1.4 x 1.0 cm. There is a new sub solid 1.0 x 0.9 cm peripheral right upper lobe pulmonary nodule (5/29). Focal peribronchovascular nodularity and interstitial thickening (5/35) in the peripheral right middle lobe is not appreciably changed and suspicious for focal lymphangitic tumor spread. Stable moderate centrilobular and paraseptal emphysema. Subsegmental scarring versus atelectasis in the basilar right lower lobe.  Upper abdomen: Stable 1.9 cm hypodense right adrenal nodule, characterized as a benign adenoma on 04/05/2015 chest CT.  Musculoskeletal: Moderate degenerative changes in the thoracic spine. No aggressive appearing focal osseous lesions.  IMPRESSION: 1. Slight interval decreased size of the primary spiculated central right lower/middle lobe malignancy, now 3.8 x 2.7 cm. Improved patency of the encased right middle and right lower lobe bronchi. 2. Stable focal  peribronchovascular nodularity and interstitial thickening in the peripheral right middle lobe, suspicious for stable focal lymphangitic tumor spread. 3. Nonspecific new subsolid 1.0 cm peripheral right upper lobe pulmonary nodule, which could represent early post treatment change or a subsolid metastasis. 4. Stable to decreased size of the previously described sub solid left lung nodules, several of which are cavitary. These remain suspicious for lung metastases. 5. Moderate centrilobular and paraseptal emphysema. 6. Atherosclerosis, including left anterior descending coronary artery disease.   Electronically Signed   By: JIlona SorrelM.D.   On: 07/22/2015 17:34    ASSESSMENT AND PLAN: This is a very pleasant 74years old white male with stage IIIA non-small cell lung cancer. He is status post a course of concurrent chemoradiation with weekly carboplatin and paclitaxel with partial response. I discussed the scan results and showed the images to the patient and his family.  I gave the patient the option of continuous observation versus consideration of consolidation chemotherapy. He is interested in proceeding with chemotherapy. I discussed with him to regimen for treatment of his condition including carboplatin for AUC of 5 and paclitaxel 175 MG/M2 every 3 weeks with Neulasta support versus treatment with carboplatin for AUC of 5 on day 1 and gemcitabine 1000 MG/M2 on days 1 and 8 every 3 weeks. I discussed with the patient adverse effect of each treatment including but not limited to alopecia, myelosuppression, nausea and vomiting, peripheral neuropathy, liver or renal dysfunction. The patient is interested in treatment with carboplatin and gemcitabine because of the lower risk for alopecia and peripheral neuropathy. He is expected to start the first cycle of this treatment on 08/08/2015. I will see the patient back for follow-up visit in one month's with the start of cycle #2. He was advised to call  immediately if he has any concerning symptoms in the interval. The patient voices understanding of current disease status and treatment options and is in agreement with the current care plan.  All questions were answered. The patient knows to call the clinic with any problems, questions or concerns. We can certainly see the patient much sooner if necessary.  Disclaimer: This note was dictated with voice recognition software. Similar sounding words can inadvertently be transcribed and may not be corrected upon review.

## 2015-07-28 ENCOUNTER — Other Ambulatory Visit: Payer: Self-pay

## 2015-07-28 ENCOUNTER — Inpatient Hospital Stay (HOSPITAL_COMMUNITY): Payer: 59

## 2015-07-28 ENCOUNTER — Telehealth: Payer: Self-pay | Admitting: *Deleted

## 2015-07-28 ENCOUNTER — Emergency Department (HOSPITAL_COMMUNITY): Payer: 59

## 2015-07-28 ENCOUNTER — Encounter (HOSPITAL_COMMUNITY): Payer: Self-pay | Admitting: *Deleted

## 2015-07-28 ENCOUNTER — Ambulatory Visit: Admission: RE | Admit: 2015-07-28 | Payer: 59 | Source: Ambulatory Visit | Admitting: Radiation Oncology

## 2015-07-28 ENCOUNTER — Inpatient Hospital Stay (HOSPITAL_COMMUNITY)
Admission: EM | Admit: 2015-07-28 | Discharge: 2015-08-18 | DRG: 199 | Disposition: A | Discharge status: Home Health | Payer: 59 | Attending: Internal Medicine | Admitting: Internal Medicine

## 2015-07-28 DIAGNOSIS — J439 Emphysema, unspecified: Secondary | ICD-10-CM | POA: Diagnosis not present

## 2015-07-28 DIAGNOSIS — J189 Pneumonia, unspecified organism: Secondary | ICD-10-CM | POA: Diagnosis not present

## 2015-07-28 DIAGNOSIS — C3411 Malignant neoplasm of upper lobe, right bronchus or lung: Secondary | ICD-10-CM

## 2015-07-28 DIAGNOSIS — C3491 Malignant neoplasm of unspecified part of right bronchus or lung: Secondary | ICD-10-CM | POA: Diagnosis present

## 2015-07-28 DIAGNOSIS — F17210 Nicotine dependence, cigarettes, uncomplicated: Secondary | ICD-10-CM | POA: Diagnosis present

## 2015-07-28 DIAGNOSIS — M62838 Other muscle spasm: Secondary | ICD-10-CM | POA: Diagnosis not present

## 2015-07-28 DIAGNOSIS — C3490 Malignant neoplasm of unspecified part of unspecified bronchus or lung: Secondary | ICD-10-CM | POA: Diagnosis present

## 2015-07-28 DIAGNOSIS — R0989 Other specified symptoms and signs involving the circulatory and respiratory systems: Secondary | ICD-10-CM

## 2015-07-28 DIAGNOSIS — J939 Pneumothorax, unspecified: Secondary | ICD-10-CM

## 2015-07-28 DIAGNOSIS — J9383 Other pneumothorax: Secondary | ICD-10-CM | POA: Diagnosis present

## 2015-07-28 DIAGNOSIS — Z419 Encounter for procedure for purposes other than remedying health state, unspecified: Secondary | ICD-10-CM

## 2015-07-28 DIAGNOSIS — J42 Unspecified chronic bronchitis: Secondary | ICD-10-CM | POA: Diagnosis not present

## 2015-07-28 DIAGNOSIS — Z7289 Other problems related to lifestyle: Secondary | ICD-10-CM | POA: Diagnosis not present

## 2015-07-28 DIAGNOSIS — R0602 Shortness of breath: Secondary | ICD-10-CM | POA: Diagnosis present

## 2015-07-28 DIAGNOSIS — J9382 Other air leak: Secondary | ICD-10-CM | POA: Diagnosis present

## 2015-07-28 DIAGNOSIS — Z85828 Personal history of other malignant neoplasm of skin: Secondary | ICD-10-CM

## 2015-07-28 DIAGNOSIS — J9811 Atelectasis: Secondary | ICD-10-CM | POA: Diagnosis not present

## 2015-07-28 DIAGNOSIS — J441 Chronic obstructive pulmonary disease with (acute) exacerbation: Secondary | ICD-10-CM | POA: Diagnosis not present

## 2015-07-28 DIAGNOSIS — Z79899 Other long term (current) drug therapy: Secondary | ICD-10-CM

## 2015-07-28 DIAGNOSIS — Z923 Personal history of irradiation: Secondary | ICD-10-CM | POA: Diagnosis not present

## 2015-07-28 DIAGNOSIS — Z789 Other specified health status: Secondary | ICD-10-CM | POA: Diagnosis not present

## 2015-07-28 DIAGNOSIS — J449 Chronic obstructive pulmonary disease, unspecified: Secondary | ICD-10-CM | POA: Diagnosis not present

## 2015-07-28 DIAGNOSIS — E875 Hyperkalemia: Secondary | ICD-10-CM | POA: Diagnosis present

## 2015-07-28 DIAGNOSIS — B370 Candidal stomatitis: Secondary | ICD-10-CM | POA: Diagnosis not present

## 2015-07-28 DIAGNOSIS — Z9221 Personal history of antineoplastic chemotherapy: Secondary | ICD-10-CM | POA: Diagnosis not present

## 2015-07-28 DIAGNOSIS — Y95 Nosocomial condition: Secondary | ICD-10-CM | POA: Diagnosis not present

## 2015-07-28 DIAGNOSIS — Z9689 Presence of other specified functional implants: Secondary | ICD-10-CM

## 2015-07-28 DIAGNOSIS — J9311 Primary spontaneous pneumothorax: Secondary | ICD-10-CM

## 2015-07-28 HISTORY — PX: CHEST TUBE INSERTION: SHX231

## 2015-07-28 LAB — CBC WITH DIFFERENTIAL/PLATELET
BASOS ABS: 0 10*3/uL (ref 0.0–0.1)
BASOS PCT: 1 %
EOS ABS: 0.3 10*3/uL (ref 0.0–0.7)
EOS PCT: 4 %
HCT: 41.1 % (ref 39.0–52.0)
Hemoglobin: 14.6 g/dL (ref 13.0–17.0)
LYMPHS PCT: 12 %
Lymphs Abs: 1 10*3/uL (ref 0.7–4.0)
MCH: 32.5 pg (ref 26.0–34.0)
MCHC: 35.5 g/dL (ref 30.0–36.0)
MCV: 91.5 fL (ref 78.0–100.0)
Monocytes Absolute: 0.6 10*3/uL (ref 0.1–1.0)
Monocytes Relative: 8 %
Neutro Abs: 5.8 10*3/uL (ref 1.7–7.7)
Neutrophils Relative %: 75 %
PLATELETS: 244 10*3/uL (ref 150–400)
RBC: 4.49 MIL/uL (ref 4.22–5.81)
RDW: 17.3 % — ABNORMAL HIGH (ref 11.5–15.5)
WBC: 7.8 10*3/uL (ref 4.0–10.5)

## 2015-07-28 LAB — BASIC METABOLIC PANEL
ANION GAP: 8 (ref 5–15)
BUN: 20 mg/dL (ref 6–20)
CO2: 23 mmol/L (ref 22–32)
Calcium: 9.3 mg/dL (ref 8.9–10.3)
Chloride: 105 mmol/L (ref 101–111)
Creatinine, Ser: 0.9 mg/dL (ref 0.61–1.24)
GFR calc Af Amer: >60 mL/min (ref >60)
GLUCOSE: 107 mg/dL — AB (ref 65–99)
POTASSIUM: 5.3 mmol/L — AB (ref 3.5–5.1)
Sodium: 136 mmol/L (ref 135–145)

## 2015-07-28 LAB — I-STAT TROPONIN, ED: Troponin i, poc: 0 ng/mL (ref 0.00–0.08)

## 2015-07-28 LAB — BRAIN NATRIURETIC PEPTIDE: B NATRIURETIC PEPTIDE 5: 42.7 pg/mL (ref 0.0–100.0)

## 2015-07-28 MED ORDER — MORPHINE SULFATE (PF) 2 MG/ML IV SOLN
2.0000 mg | Freq: Once | INTRAVENOUS | Status: AC
  Administered 2015-07-28: 2 mg via INTRAVENOUS
  Filled 2015-07-28: qty 1

## 2015-07-28 MED ORDER — MIDAZOLAM HCL 2 MG/2ML IJ SOLN
INTRAMUSCULAR | Status: AC | PRN
  Administered 2015-07-28: 2 mg via INTRAVENOUS

## 2015-07-28 MED ORDER — ALUM & MAG HYDROXIDE-SIMETH 200-200-20 MG/5ML PO SUSP: 30.0000 mL | Freq: Four times a day (QID) | ORAL | Status: DC | PRN

## 2015-07-28 MED ORDER — SODIUM CHLORIDE 0.9 % IV SOLN: 250.0000 mL | INTRAVENOUS | Status: DC | PRN

## 2015-07-28 MED ORDER — ACETAMINOPHEN 325 MG PO TABS: 650.0000 mg | ORAL_TABLET | Freq: Four times a day (QID) | ORAL | Status: DC | PRN

## 2015-07-28 MED ORDER — MORPHINE SULFATE (PF) 2 MG/ML IV SOLN
INTRAVENOUS | Status: AC
  Administered 2015-07-28: 2 mg via INTRAVENOUS
  Filled 2015-07-28: qty 1

## 2015-07-28 MED ORDER — MORPHINE SULFATE (PF) 2 MG/ML IV SOLN
2.0000 mg | INTRAVENOUS | Status: DC | PRN
  Administered 2015-07-28 – 2015-08-05 (×15): 2 mg via INTRAVENOUS
  Filled 2015-07-28 (×18): qty 1

## 2015-07-28 MED ORDER — ONDANSETRON HCL 4 MG/2ML IJ SOLN
4.0000 mg | Freq: Four times a day (QID) | INTRAMUSCULAR | Status: DC | PRN
  Administered 2015-08-02: 4 mg via INTRAVENOUS

## 2015-07-28 MED ORDER — ACETAMINOPHEN 650 MG RE SUPP: 650.0000 mg | Freq: Four times a day (QID) | RECTAL | Status: DC | PRN

## 2015-07-28 MED ORDER — MORPHINE SULFATE 10 MG/ML IJ SOLN
INTRAMUSCULAR | Status: AC | PRN
  Administered 2015-07-28 (×2): 2 mg via INTRAVENOUS

## 2015-07-28 MED ORDER — ONDANSETRON HCL 4 MG PO TABS
4.0000 mg | ORAL_TABLET | Freq: Four times a day (QID) | ORAL | Status: DC | PRN
Start: 2015-07-28 — End: 2015-08-18

## 2015-07-28 MED ORDER — SODIUM CHLORIDE 0.9 % IV BOLUS (SEPSIS)
1000.0000 mL | Freq: Once | INTRAVENOUS | Status: AC
  Administered 2015-07-28: 1000 mL via INTRAVENOUS

## 2015-07-28 MED ORDER — SODIUM CHLORIDE 0.9 % IJ SOLN
3.0000 mL | Freq: Two times a day (BID) | INTRAMUSCULAR | Status: DC
  Administered 2015-07-29 – 2015-08-03 (×5): 3 mL via INTRAVENOUS

## 2015-07-28 MED ORDER — MIDAZOLAM HCL 2 MG/2ML IJ SOLN
2.0000 mg | Freq: Once | INTRAMUSCULAR | Status: AC
  Administered 2015-07-28: 2 mg via INTRAVENOUS
  Filled 2015-07-28: qty 2

## 2015-07-28 MED ORDER — SODIUM CHLORIDE 0.9 % IJ SOLN: 3.0000 mL | INTRAMUSCULAR | Status: DC | PRN

## 2015-07-28 MED ORDER — LIDOCAINE HCL 1 % IJ SOLN
INTRAMUSCULAR | Status: AC
  Administered 2015-07-28: 17 mL
  Filled 2015-07-28: qty 20

## 2015-07-28 MED ORDER — OXYCODONE HCL 5 MG PO TABS
5.0000 mg | ORAL_TABLET | ORAL | Status: DC | PRN
  Administered 2015-07-28 – 2015-08-14 (×64): 5 mg via ORAL
  Filled 2015-07-28 (×65): qty 1

## 2015-07-28 MED ORDER — SODIUM CHLORIDE 0.9 % IJ SOLN
3.0000 mL | Freq: Two times a day (BID) | INTRAMUSCULAR | Status: DC
  Administered 2015-07-28 – 2015-08-17 (×22): 3 mL via INTRAVENOUS

## 2015-07-28 MED ORDER — DOCUSATE SODIUM 100 MG PO CAPS
100.0000 mg | ORAL_CAPSULE | Freq: Three times a day (TID) | ORAL | Status: DC | PRN
  Administered 2015-07-28 – 2015-08-10 (×29): 100 mg via ORAL
  Filled 2015-07-28 (×30): qty 1

## 2015-07-28 MED ORDER — ENOXAPARIN SODIUM 40 MG/0.4ML ~~LOC~~ SOLN
40.0000 mg | SUBCUTANEOUS | Status: DC
  Administered 2015-07-28 – 2015-08-17 (×21): 40 mg via SUBCUTANEOUS
  Filled 2015-07-28 (×22): qty 0.4

## 2015-07-28 NOTE — Sedation Documentation (Signed)
Chest tube placed by Dr. Servando Snare to the right chest.

## 2015-07-28 NOTE — ED Notes (Signed)
Pt remains on monitor. 

## 2015-07-28 NOTE — Procedures (Signed)
      [address]       [place] [address]             [phone]     Chest Tube Insertion Procedure Note  Indications:  Clinically significant Pneumothorax  Pre-operative Diagnosis: Pneumothorax  Post-operative Diagnosis: Pneumothorax  Procedure Details  Informed consent was obtained for the procedure, including sedation.  Risks of lung perforation, hemorrhage, arrhythmia, and adverse drug reaction were discussed.   After sterile skin prep, using standard technique, a 28 French tube was placed in the right lateral 7 rib space.  Findings: Chest xray post tube lung inflated   Estimated Blood Loss:  Minimal         Specimens:  None              Complications:  None; patient tolerated the procedure well.         Disposition: admission          Condition: stable  Attending Attestation: I performed the procedure.

## 2015-07-28 NOTE — Sedation Documentation (Signed)
Patient denies pain and is resting comfortably.  

## 2015-07-28 NOTE — ED Provider Notes (Signed)
CSN: [id number]     Arrival date & time 07/28/15  2458 History   First MD Initiated Contact with Patient 07/28/15 [phone]     Chief Complaint  Patient presents with  . Shortness of Breath     (Consider location/radiation/quality/duration/timing/severity/associated sxs/prior Treatment) Patient is a 74 y.o. male presenting with shortness of breath.  Shortness of Breath Severity:  Severe Onset quality:  Gradual Duration:  1 hour Timing:  Constant Progression:  Unchanged Chronicity:  New Context: activity   Relieved by:  Oxygen and rest Worsened by:  Exertion Associated symptoms: no abdominal pain, no chest pain, no cough, no diaphoresis, no fever, no headaches, no hemoptysis, no PND, no rash, no sore throat, no sputum production and no wheezing   Risk factors: hx of cancer and recent surgery (9/16)   Risk factors: no hx of PE/DVT   Felt "muscle pull" similar to last pneumothorax.  Past Medical History  Diagnosis Date  . Spontaneous pneumothorax MANY YRS AGO AND AGAIN 04/04/15    HISTORY OF (ONLY)  . Cancer     skin cancer  . Lung mass     right / HAS HAD RADIATION AND CHEMO FOR LUNG CANCER  . Inguinal hernia   . Productive cough     "DUE TO RADIATION"  . History of nonmelanoma skin cancer   . Constipation     AFTER ANESTHESIA  . Difficulty sleeping   . Radiation 05/11/15-06/21/15    NSCLCA/right lung   Past Surgical History  Procedure Laterality Date  . Hernia repair  2011  . Colonoscopy w/ biopsies and polypectomy    . Wisdom tooth extraction    . Tonsillectomy    . Video bronchoscopy with endobronchial ultrasound N/A 04/18/2015    Procedure: VIDEO BRONCHOSCOPY WITH ENDOBRONCHIAL ULTRASOUND;  Surgeon: Grace Isaac, MD;  Location: [facility];  Service: Thoracic;  Laterality: N/A;  . Inguinal hernia repair Left 07/15/2015    Procedure: OPEN LEFT INGUINAL HERNIA REPAIR;  Surgeon: Johnathan Hausen, MD;  Location: [facility];  Service: General;  Laterality: Left;  With MESH   Family  History  Problem Relation Age of Onset  . Heart attack Father   . Hypertension Mother   . Heart disease Father    Social History  Substance Use Topics  . Smoking status: Current Every Day Smoker -- 0.50 packs/day for 55 years    Types: Cigarettes  . Smokeless tobacco: Never Used  . Alcohol Use: Yes     Comment: every other night -social    Review of Systems  Constitutional: Negative for fever and diaphoresis.  HENT: Negative for sore throat.   Eyes: Negative for visual disturbance.  Respiratory: Positive for shortness of breath. Negative for cough, hemoptysis, sputum production and wheezing.   Cardiovascular: Negative for chest pain and PND.  Gastrointestinal: Negative for abdominal pain.  Genitourinary: Negative for difficulty urinating.  Musculoskeletal: Negative for back pain and neck stiffness.  Skin: Negative for rash.  Neurological: Negative for syncope and headaches.      Allergies  Aleve  Home Medications   Prior to Admission medications   Medication Sig Start Date End Date Taking? Authorizing Provider  albuterol (PROVENTIL HFA;VENTOLIN HFA) 108 (90 BASE) MCG/ACT inhaler Inhale 1-2 puffs into the lungs every 6 (six) hours as needed for wheezing or shortness of breath. 05/18/15  Yes Wandra Arthurs, MD  Ascorbic Acid (VITAMIN C) 1000 MG tablet Take 1,000 mg by mouth daily.   Yes Historical Provider, MD  b complex  vitamins tablet Take 1 tablet by mouth daily.   Yes Historical Provider, MD  calcium-vitamin D (OSCAL WITH D) 500-200 MG-UNIT per tablet Take 1 tablet by mouth daily.   Yes Historical Provider, MD  Cyanocobalamin (VITAMIN B 12 PO) Take 1 tablet by mouth daily.   Yes Historical Provider, MD  docusate sodium (COLACE) 100 MG capsule Take 100 mg by mouth 3 (three) times daily as needed for mild constipation.    Yes Historical Provider, MD  L-Arginine 1000 MG TABS Take 1,000 mg by mouth daily.    Historical Provider, MD  magnesium 30 MG tablet Take 30 mg by mouth  daily.    Historical Provider, MD  Multiple Vitamin (MULTI VITAMIN DAILY PO) Take 1 tablet by mouth daily.    Historical Provider, MD  Omega-3 Fatty Acids (FISH OIL) 1000 MG CAPS Take 1,000 mg by mouth daily.    Historical Provider, MD  potassium chloride (K-DUR) 10 MEQ tablet Take 10 mEq by mouth daily.    Historical Provider, MD  vitamin A 10000 UNIT capsule Take 10,000 Units by mouth daily.    Historical Provider, MD   BP 132/82 mmHg  Pulse 85  Temp(Src) 97.8 F (36.6 C) (Oral)  Resp 19  Ht '5\' 10"'$  (1.778 m)  Wt 135 lb (61.236 kg)  BMI 19.37 kg/m2  SpO2 100% Physical Exam  Constitutional: He is oriented to person, place, and time. He appears well-developed and well-nourished. No distress.  HENT:  Head: Normocephalic and atraumatic.  Eyes: Conjunctivae and EOM are normal.  Neck: Normal range of motion.  Cardiovascular: Normal rate, regular rhythm, normal heart sounds and intact distal pulses.  Exam reveals no gallop and no friction rub.   No murmur heard. Pulmonary/Chest: Effort normal. No respiratory distress. He has decreased breath sounds (right side diminished, bronchial breath sounds, with breath sounds present bilaterally) in the right upper field, the right middle field and the right lower field. He has no wheezes. He has no rales.  Abdominal: Soft.  Musculoskeletal: He exhibits no edema.  Neurological: He is alert and oriented to person, place, and time.  Skin: Skin is warm and dry. He is not diaphoretic.  Nursing note and vitals reviewed.   ED Course  Procedures (including critical care time) Labs Review Labs Reviewed  BASIC METABOLIC PANEL - Abnormal; Notable for the following:    Potassium 5.3 (*)    Glucose, Bld 107 (*)    All other components within normal limits  CBC WITH DIFFERENTIAL/PLATELET - Abnormal; Notable for the following:    RDW 17.3 (*)    All other components within normal limits  BRAIN NATRIURETIC PEPTIDE  I-STAT TROPOININ, ED    Imaging  Review Dg Chest Portable 1 View  07/28/2015   CLINICAL DATA:  Short of breath.  EXAM: PORTABLE CHEST 1 VIEW  COMPARISON:  05/18/2015  FINDINGS: Large right pneumothorax approximately 60%. There is collapse in the right lower lobe. No significant effusion.  Increased interstitial markings in the left lung likely due to mild interstitial edema and vascular congestion. No edema or effusion on the left.  Underlying chronic lung disease and COPD.  IMPRESSION: 60% right pneumothorax.  Critical Value/emergent results were called by telephone at the time of interpretation on 07/28/2015 at 7:51 am to Dr. Gareth Morgan , who verbally acknowledged these results.   Electronically Signed   By: Franchot Gallo M.D.   On: 07/28/2015 07:52   I have personally reviewed and evaluated these images and lab results  as part of my medical decision-making.   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 Servando Snare CT Surgery), recent hernia repair September 16th presents with concern for shortness of breath.  Differential diagnosis for dyspnea includes ACS, PE,pneumothorax, CHF exacerbation, anemia, pneumonia.  Chest x-ray was done which showed large (60-70%) pneumothorax.  Patient with O2 saturation 88% on EMS arrival with exertion, 93% in ED on room air, however was placed on nonrebreather O2, VS otherwise stable. Consulted CT surgery.  Labs show mild hyperkalemia of 5.3, no renal injury,  no EKG changes and will give IVF and monitor.   CT Surgery, Dr. Servando Snare, came to emergency department and placed chest tube. Patient to be admitted to hospitalist for continued care.    Gareth Morgan, MD 07/28/15 [phone]

## 2015-07-28 NOTE — Telephone Encounter (Signed)
Patient called from hospital bed.  "I have problems with my appointments.  There are labs scheduled after chemotherapy.  All appointments are two hours.  The first is two hours, the second is thirty minutes.  I am working so this needs to be corrected.  Please tell Dr. Julien Nordmann I surprisingly have been admitted with a collapsed lung.  In room 1423."   Explained appointment on day 8 still need time for assessment, access, pre-meds and then the gemzar so two hours is needed.

## 2015-07-28 NOTE — ED Notes (Signed)
Pt arrives to the ER via EMS for complaints of shortness of breath upon exertion; pt states that he began having shortness of breath with exertion; pt states that he had a spontaneous pneumo in June and is concerned that this is happening again; EMS reports that pt ambulated to the EMS stretcher and his O2 sat was 88% on room air; pt was placed on NRB for comfort; pt with slightly dim lung sounds on the right per EMS

## 2015-07-28 NOTE — Consult Note (Signed)
[address]       [place] [address]             [phone]        Gabriel [name] Kingsley Medical Record #[id number] Date of Birth: 08-10-1941  [facility]: [facility] Primary Care: No PCP Per Patient  Chief Complaint:    Chief Complaint  Patient presents with  . Shortness of Breath    History of Present Illness:  Patient with known lung CA being treated presents with recurrent PTX on the right. Was diagnosed in June when he presented with rt ptx, ct of chest at that time revealed. Right lung mass.        Non-small cell carcinoma of right lung, stage 3   Staging form: Lung, AJCC 7th Edition     Clinical stage from 04/21/2015: Stage IIIA (T2a, N2, M0) - Signed by Curt Bears, MD on 04/21/2015  DIAGNOSIS: Non-small cell carcinoma of right lung, stage 3  Staging form: Lung, AJCC 7th Edition  Clinical stage from 04/21/2015: Stage IIIA (T2a, N2, M0) - Signed by Curt Bears, MD on 04/21/2015  Foundation One molecular studies reveal that he was positive for [place], [place], and STAG2 S102f*20. He was negative for: EGFR, ALK, BRAF, MET, RET, ERBB2 and ROS1  PRIOR THERAPY: Concurrent chemoradiation with chemotherapy in the form of weekly carboplatin for AUC 2 paclitaxel 45 mg/m given concurrent with radiation therapy. First cycle started 05/09/2015. Status post 7 cycles.  CURRENT THERAPY: Consolidation chemotherapy with carboplatin for AUC of 5 on day 1 and gemcitabine 1000 MG/M2 on days 1 and 8   Current Activity/ Functional Status: Patient is independent with mobility/ambulation, transfers, ADL's, IADL's.   Zubrod Score: At the time of surgery this patient's most appropriate activity status/level should be described as: []     0    Normal activity, no symptoms []     1    Restricted in physical strenuous activity but ambulatory, able to do out light work []     2    Ambulatory and capable of self care, unable to do work activities,  up and about                 more than 50%  Of the time                            []     3    Only limited self care, in bed greater than 50% of waking hours []     4    Completely disabled, no self care, confined to bed or chair []     5    Moribund  Past Medical History  Diagnosis Date  . Spontaneous pneumothorax MANY YRS AGO AND AGAIN 04/04/15    HISTORY OF (ONLY)  . Cancer     skin cancer  . Lung mass     right / HAS HAD RADIATION AND CHEMO FOR LUNG CANCER  . Inguinal hernia   . Productive cough     "DUE TO RADIATION"  . History of nonmelanoma skin cancer   . Constipation     AFTER ANESTHESIA  . Difficulty sleeping   . Radiation 05/11/15-06/21/15    NSCLCA/right lung    Past Surgical History  Procedure Laterality Date  . Hernia repair  2011  . Colonoscopy w/ biopsies and polypectomy    . Wisdom tooth extraction    .  Tonsillectomy    . Video bronchoscopy with endobronchial ultrasound N/A 04/18/2015    Procedure: VIDEO BRONCHOSCOPY WITH ENDOBRONCHIAL ULTRASOUND;  Surgeon: Grace Isaac, MD;  Location: [facility];  Service: Thoracic;  Laterality: N/A;  . Inguinal hernia repair Left 07/15/2015    Procedure: OPEN LEFT INGUINAL HERNIA REPAIR;  Surgeon: Johnathan Hausen, MD;  Location: [facility];  Service: General;  Laterality: Left;  With MESH    History  Smoking status  . Current Every Day Smoker -- 0.50 packs/day for 55 years  . Types: Cigarettes  Smokeless tobacco  . Never Used   History  Alcohol Use  . Yes    Comment: every other night -social    Social History   Social History  . Marital Status: Divorced    Spouse Name: N/A  . Number of Children: N/A  . Years of Education: N/A   Occupational History  . engineer    Social History Main Topics  . Smoking status: Current Every Day Smoker -- 0.50 packs/day for 55 years    Types: Cigarettes  . Smokeless tobacco: Never Used  . Alcohol Use: Yes     Comment: every other night -social  . Drug Use: No  . Sexual Activity:  Not on file   Other Topics Concern  . Not on file   Social History Narrative    Allergies  Allergen Reactions  . Aleve [Naproxen] Rash    Current Facility-Administered Medications  Medication Dose Route Frequency Provider Last Rate Last Dose  . midazolam (VERSED) injection 2 mg  2 mg Intravenous Once Grace Isaac, MD      . morphine 2 MG/ML injection 2 mg  2 mg Intravenous Once Grace Isaac, MD       Current Outpatient Prescriptions  Medication Sig Dispense Refill  . albuterol (PROVENTIL HFA;VENTOLIN HFA) 108 (90 BASE) MCG/ACT inhaler Inhale 1-2 puffs into the lungs every 6 (six) hours as needed for wheezing or shortness of breath. 1 Inhaler 0  . Ascorbic Acid (VITAMIN C) 1000 MG tablet Take 1,000 mg by mouth daily.    Marland Kitchen b complex vitamins tablet Take 1 tablet by mouth daily.    . calcium-vitamin D (OSCAL WITH D) 500-200 MG-UNIT per tablet Take 1 tablet by mouth daily.    . Cyanocobalamin (VITAMIN B 12 PO) Take 1 tablet by mouth daily.    Marland Kitchen docusate sodium (COLACE) 100 MG capsule Take 100 mg by mouth 3 (three) times daily as needed for mild constipation.     Marland Kitchen L-Arginine 1000 MG TABS Take 1,000 mg by mouth daily.    . magnesium 30 MG tablet Take 30 mg by mouth daily.    . Multiple Vitamin (MULTI VITAMIN DAILY PO) Take 1 tablet by mouth daily.    . Omega-3 Fatty Acids (FISH OIL) 1000 MG CAPS Take 1,000 mg by mouth daily.    . potassium chloride (K-DUR) 10 MEQ tablet Take 10 mEq by mouth daily.    . vitamin A 10000 UNIT capsule Take 10,000 Units by mouth daily.       (Not in a hospital admission)  Family History  Problem Relation Age of Onset  . Heart attack Father   . Hypertension Mother   . Heart disease Father      Review of Systems:      Cardiac Review of Systems: Y or N  Chest Pain [  y  ]  Resting SOB [  y ] Exertional SOB  Blue.Reese  ]  Orthopnea [  ]   Pedal Edema [ n  ]    Palpitations [n ] Syncope  [ n ]   Presyncope [n   ]  General Review of Systems: [Y]  = yes [  ]=no Constitional: recent weight change [  ]; anorexia [  ]; fatigue [  ]; nausea [  ]; night sweats [  ]; fever [  ]; or chills [  ]                                                               Dental: poor dentition[  ]; Last Dentist visit:   Eye : blurred vision [  ]; diplopia [   ]; vision changes [  ];  Amaurosis fugax[  ]; Resp: cough [  ];  wheezing[  ];  hemoptysis[  ]; shortness of breath[  ]; paroxysmal nocturnal dyspnea[  ]; dyspnea on exertion[  ]; or orthopnea[  ];  GI:  gallstones[  ], vomiting[  ];  dysphagia[  ]; melena[  ];  hematochezia [  ]; heartburn[  ];   Hx of  Colonoscopy[  ]; GU: kidney stones [  ]; hematuria[  ];   dysuria [  ];  nocturia[  ];  history of     obstruction [  ]; urinary frequency [  ]             Skin: rash, swelling[  ];, hair loss[  ];  peripheral edema[  ];  or itching[  ]; Musculosketetal: myalgias[  ];  joint swelling[  ];  joint erythema[  ];  joint pain[  ];  back pain[  ];  Heme/Lymph: bruising[  ];  bleeding[  ];  anemia[  ];  Neuro: TIA[  ];  headaches[  ];  stroke[  ];  vertigo[  ];  seizures[  ];   paresthesias[  ];  difficulty walking[  ];  Psych:depression[  ]; anxiety[  ];  Endocrine: diabetes[  ];  thyroid dysfunction[  ];  Immunizations: Flu [  ]; Pneumococcal[  ];  Other:  Physical Exam: BP 124/86 mmHg  Pulse 97  Temp(Src) 97.8 F (36.6 C) (Oral)  Resp 24  Ht 5' 10"  (1.778 m)  Wt 135 lb (61.236 kg)  BMI 19.37 kg/m2  SpO2 95%   General appearance: alert, cooperative and appears stated age Head: Normocephalic, without obvious abnormality, atraumatic Neck: no adenopathy, no carotid bruit, no JVD, supple, symmetrical, trachea midline and thyroid not enlarged, symmetric, no tenderness/mass/nodules Lymph nodes: Cervical, supraclavicular, and axillary nodes normal. Resp: diminished breath sounds RLL, RML and RUL Back: symmetric, no curvature. ROM normal. No CVA tenderness. Cardio: regular rate and rhythm, S1, S2 normal,  no murmur, click, rub or gallop GI: soft, non-tender; bowel sounds normal; no masses,  no organomegaly Extremities: extremities normal, atraumatic, no cyanosis or edema Neurologic: Grossly normal  Diagnostic Studies & Laboratory data:     Recent Radiology Findings:   Dg Chest Portable 1 View  07/28/2015   CLINICAL DATA:  Short of breath.  EXAM: PORTABLE CHEST 1 VIEW  COMPARISON:  05/18/2015  FINDINGS: Large right pneumothorax approximately 60%. There is collapse in the right lower lobe. No significant effusion.  Increased interstitial markings in the left lung likely due to mild  interstitial edema and vascular congestion. No edema or effusion on the left.  Underlying chronic lung disease and COPD.  IMPRESSION: 60% right pneumothorax.  Critical Value/emergent results were called by telephone at the time of interpretation on 07/28/2015 at 7:51 am to Dr. Gareth Morgan , who verbally acknowledged these results.   Electronically Signed   By: Franchot Gallo M.D.   On: 07/28/2015 07:52     I have independently reviewed the above radiologic studies.  Recent Lab Findings: Lab Results  Component Value Date   WBC 10.3 07/25/2015   HGB 14.7 07/25/2015   HCT 43.7 07/25/2015   PLT 298 07/25/2015   GLUCOSE 107* 07/28/2015   ALT 20 07/25/2015   AST 18 07/25/2015   NA 136 07/28/2015   K 5.3* 07/28/2015   CL 105 07/28/2015   CREATININE 0.90 07/28/2015   BUN 20 07/28/2015   CO2 23 07/28/2015   INR 1.07 04/18/2015      Assessment / Plan:     Recurrent pneumothorax, right- placed chest tube Ad[facility] to [facility] if needs more then chest tube will need to move to [facility]    I  spent 30 minutes counseling the patient face to face and 50% or more the  time was spent in counseling and coordination of care. The total time spent in the appointment was 40 minutes.    [name]      [address] [place] [address] Office [phone]   Beeper  [phone]  07/28/2015 8:45 AM

## 2015-07-28 NOTE — H&P (Signed)
[facility] History and Physical  Gabriel [name] EXH:[id number] DOB: 1940-12-13 DOA: 07/28/2015  Referring physician:  PCP: No PCP Per Patient   Chief Complaint: Shortness of breath  HPI: Gabriel [name] is a 74 y.o. 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 He was last seen by Dr. Julien Nordmann on 07/25/2015 with treatment options were discussed at the time, he expects to start treatment with carboplatin and gemcitabine on 08/08/2015. Gabriel [name] also has a history of a right-sided pneumothorax diagnosed on 04/04/2015 undergoing chest tube placement. He presented to the emergency room today with complaints of severe dyspnea on exertion. He reported getting out of bed today having significant shortness of breath and intolerance to physical exertion. He denied fevers, chills, wheezing, cough, sputum production, falls, chest pain or recent trauma. On x-ray performed in the emergency department revealed a right-sided pneumothorax. Dr Creta Levin all of cardiothoracic surgery was consulted as a chest tube placement was performed in the emergency department.  Repeat chest x-ray showed significantly improved right-sided pneumothorax.                                                                                     Review of Systems:  Constitutional:  No weight loss, night sweats, Fevers, chills, fatigue.  HEENT:  No headaches, Difficulty swallowing,Tooth/dental problems,Sore throat,  No sneezing, itching, ear ache, nasal congestion, post nasal drip,  Cardio-vascular:  No chest pain, Orthopnea, PND, swelling in lower extremities, anasarca, dizziness, palpitations  GI:  No heartburn, indigestion, abdominal pain, nausea, vomiting, diarrhea, change in bowel habits, loss of appetite  Resp:  Positive for shortness of  breath with exertion or at rest. No excess mucus, no productive cough, No non-productive cough, No coughing up of blood.No change in color of mucus.No wheezing.No chest wall deformity  Skin:  no rash or lesions.  GU:  no dysuria, change in color of urine, no urgency or frequency. No flank pain.  Musculoskeletal:  No joint pain or swelling. No decreased range of motion. No back pain.  Psych:  No change in mood or affect. No depression or anxiety. No memory loss.   Past Medical History  Diagnosis Date  . Spontaneous pneumothorax MANY YRS AGO AND AGAIN 04/04/15    HISTORY OF (ONLY)  . Cancer     skin cancer  . Lung mass     right / HAS HAD RADIATION AND CHEMO FOR LUNG CANCER  . Inguinal hernia   . Productive cough     "DUE TO RADIATION"  . History of nonmelanoma skin cancer   . Constipation     AFTER ANESTHESIA  . Difficulty sleeping   . Radiation 05/11/15-06/21/15    NSCLCA/right lung   Past Surgical History  Procedure Laterality Date  . Hernia repair  2011  . Colonoscopy w/ biopsies and polypectomy    . Wisdom tooth extraction    . Tonsillectomy    . Video bronchoscopy with endobronchial ultrasound N/A 04/18/2015    Procedure: VIDEO BRONCHOSCOPY WITH ENDOBRONCHIAL ULTRASOUND;  Surgeon:  Grace Isaac, MD;  Location: [facility];  Service: Thoracic;  Laterality: N/A;  . Inguinal hernia repair Left 07/15/2015    Procedure: OPEN LEFT INGUINAL HERNIA REPAIR;  Surgeon: Johnathan Hausen, MD;  Location: [facility];  Service: General;  Laterality: Left;  With MESH   Social History:  reports that he has been smoking Cigarettes.  He has a 27.5 pack-year smoking history. He has never used smokeless tobacco. He reports that he drinks alcohol. He reports that he does not use illicit drugs.  Allergies  Allergen Reactions  . Aleve [Naproxen] Rash    Family History  Problem Relation Age of Onset  . Heart attack Father   . Hypertension Mother   . Heart disease Father     Prior to Admission  medications   Medication Sig Start Date End Date Taking? Authorizing Provider  albuterol (PROVENTIL HFA;VENTOLIN HFA) 108 (90 BASE) MCG/ACT inhaler Inhale 1-2 puffs into the lungs every 6 (six) hours as needed for wheezing or shortness of breath. 05/18/15  Yes Wandra Arthurs, MD  Ascorbic Acid (VITAMIN C) 1000 MG tablet Take 1,000 mg by mouth daily.   Yes Historical Provider, MD  b complex vitamins tablet Take 1 tablet by mouth daily.   Yes Historical Provider, MD  CALCIUM CITRATE PO Take 1 tablet by mouth daily.   Yes Historical Provider, MD  Cyanocobalamin (VITAMIN B 12 PO) Take 1 tablet by mouth daily.   Yes Historical Provider, MD  docusate sodium (COLACE) 100 MG capsule Take 100 mg by mouth 3 (three) times daily as needed for mild constipation.    Yes Historical Provider, MD  HYDROcodone-acetaminophen (NORCO/VICODIN) 5-325 MG tablet Take 1 tablet by mouth every 6 (six) hours as needed. pain 07/15/15  Yes Historical Provider, MD  L-Arginine 1000 MG TABS Take 1,000 mg by mouth daily.   Yes Historical Provider, MD  magnesium 30 MG tablet Take 30 mg by mouth daily.   Yes Historical Provider, MD  Multiple Vitamin (MULTI VITAMIN DAILY PO) Take 1 tablet by mouth daily.   Yes Historical Provider, MD  Omega-3 Fatty Acids (FISH OIL) 1000 MG CAPS Take 1,000 mg by mouth daily.   Yes Historical Provider, MD  potassium chloride (K-DUR) 10 MEQ tablet Take 10 mEq by mouth daily.   Yes Historical Provider, MD  prochlorperazine (COMPAZINE) 10 MG tablet Take 1 tablet by mouth every 6 (six) hours as needed. n/v 05/09/15  Yes Historical Provider, MD  vitamin A 10000 UNIT capsule Take 10,000 Units by mouth daily.   Yes Historical Provider, MD   Physical Exam: Filed Vitals:   07/28/15 0932 07/28/15 0939 07/28/15 0946 07/28/15 1039  BP: 93/65 110/64 105/73 123/74  Pulse: 84 82 81 75  Temp:    97.6 F (36.4 C)  TempSrc:    Oral  Resp: '16 22 18 19  '$ Height:    '5\' 10"'$  (1.778 m)  Weight:    60.1 kg (132 lb 7.9 oz)    SpO2: 99% 100% 100% 100%    Wt Readings from Last 3 Encounters:  07/28/15 60.1 kg (132 lb 7.9 oz)  07/25/15 60.056 kg (132 lb 6.4 oz)  07/15/15 61.236 kg (135 lb)    General:  Patient says in no acute distress, he does not appear to be in acute respiratory failure, status post chest tube placement. He is awake and alert oriented able to provide history. Chronically ill-appearing. Eyes: PERRL, normal lids, irises & conjunctiva ENT: grossly normal hearing, lips & tongue Neck:  no LAD, masses or thyromegaly Cardiovascular: RRR, no m/r/g. No LE edema. Telemetry: SR, no arrhythmias  Respiratory: Left lung clear to auscultation bilaterally no wheezing rhonchi rales, right lung had a few rhonchi otherwise good air movement Abdomen: soft, ntnd Skin: no rash or induration seen on limited exam Musculoskeletal: grossly normal tone BUE/BLE Psychiatric: grossly normal mood and affect, speech fluent and appropriate Neurologic: grossly non-focal.          Labs on Admission:  Basic Metabolic Panel:  Recent Labs Lab 07/25/15 1039 07/28/15 0720  NA 138 136  K 4.2 5.3*  CL  --  105  CO2 25 23  GLUCOSE 111 107*  BUN 19.2 20  CREATININE 0.9 0.90  CALCIUM 9.7 9.3   Liver Function Tests:  Recent Labs Lab 07/25/15 1039  AST 18  ALT 20  ALKPHOS 98  BILITOT 0.34  PROT 7.4  ALBUMIN 3.8   No results for input(s): LIPASE, AMYLASE in the last 168 hours. No results for input(s): AMMONIA in the last 168 hours. CBC:  Recent Labs Lab 07/25/15 1039 07/28/15 0720  WBC 10.3 7.8  NEUTROABS 8.3* 5.8  HGB 14.7 14.6  HCT 43.7 41.1  MCV 91.6 91.5  PLT 298 244   Cardiac Enzymes: No results for input(s): CKTOTAL, CKMB, CKMBINDEX, TROPONINI in the last 168 hours.  BNP (last 3 results)  Recent Labs  04/04/15 1831 07/28/15 0720  BNP 53.8 42.7    ProBNP (last 3 results) No results for input(s): PROBNP in the last 8760 hours.  CBG: No results for input(s): GLUCAP in the last 168  hours.  Radiological Exams on Admission: Dg Chest Portable 1 View  07/28/2015   CLINICAL DATA:  74 year old male with right-sided chest tube for pneumothorax  EXAM: PORTABLE CHEST 1 VIEW  COMPARISON:  Prior chest x-ray 07/28/2015  FINDINGS: Right-sided thoracostomy tube has been placed. There has been partial interval re-expansion of the lung. A small (less than 10%) residual pneumothorax remains. Improving atelectasis in the right middle and lower lobes. Background chronic parenchymal lung changes again noted. Cardiac and mediastinal contours are unchanged right infrahilar mass again noted. No left-sided pneumothorax. No acute osseous abnormality.  IMPRESSION: 1. Significantly improved right-sided pneumothorax status post placement of thoracostomy tube. There is a small (less than 10%) residual pneumothorax. 2. Improving right lower and middle lobe atelectasis.   Electronically Signed   By: Jacqulynn Cadet M.D.   On: 07/28/2015 09:51   Dg Chest Portable 1 View  07/28/2015   CLINICAL DATA:  Short of breath.  EXAM: PORTABLE CHEST 1 VIEW  COMPARISON:  05/18/2015  FINDINGS: Large right pneumothorax approximately 60%. There is collapse in the right lower lobe. No significant effusion.  Increased interstitial markings in the left lung likely due to mild interstitial edema and vascular congestion. No edema or effusion on the left.  Underlying chronic lung disease and COPD.  IMPRESSION: 60% right pneumothorax.  Critical Value/emergent results were called by telephone at the time of interpretation on 07/28/2015 at 7:51 am to Dr. Gareth Morgan , who verbally acknowledged these results.   Electronically Signed   By: Franchot Gallo M.D.   On: 07/28/2015 07:52    EKG: Independently reviewed.   Assessment/Plan Principal Problem:   Pneumothorax Active Problems:   Non-small cell carcinoma of right lung, stage 3   COPD (chronic obstructive pulmonary disease)   Lung cancer   1. Spontaneous pneumothorax. Mr.  [name] has a history of COPD and lung cancer with probable underlying structural  lung disease in the context of ongoing tobacco abuse, likely leading to development of spontaneous pneumothorax. He denies history of trauma. Prior to this he had a similar episode back in June 2016 that was treated with chest tube placement. He was seen by Dr.Gerhadt of cardiothoracic surgery undergoing placement of chest tube in the emergency room. Repeat chest x-ray showed near resolution to pneumothorax.  2. Stage III non-small cell carcinoma of right lung. He is currently receiving his care at the cancer center and has been followed by Dr. Julien Nordmann of medical oncology. He was treated with chemoradiation therapy undergoing treatment with carboplatin and paclitaxel having partial response. Plans have been made for him to initiate therapy with carboplatin and gemcitabine on 08/08/2015.  3. Chronic objective pulmonary disease. He does not appear to have acute issues regarding COPD. Both lungs were overall clear without evidence for wheezing. 4. Tobacco abuse. Unfortunately patient continues to smoke about half a pack a day, indicates that he is in the process of quitting. Patient counseled.  Code Status: Full code DVT Prophylaxis: Lovenox Family Communication: I spoke to his son was present at bedside Disposition Plan: Admitting him to the inpatient service, anticipate will require rhythm 2 nights hospitalization  Time spent: 60 minutes  [name] [facility] Pager [phone]

## 2015-07-28 NOTE — ED Notes (Signed)
4th floor Charge nurse notified of delay of transport due to chest tube placement.

## 2015-07-28 NOTE — Progress Notes (Signed)
Patient have been tolerating chest tube well, with some intermittent right flank pain control with PRN pain med, patient denies any SOB or difficulty breathing, sat between 95-97%-RA, encourage patient to notify staff with any SOB. Will continue to assess patient.

## 2015-07-28 NOTE — ED Notes (Signed)
Bed: CA98 Expected date:  Expected time:  Means of arrival:  Comments: EMS 74 yo male lung cancer/short of breath non-rebreather

## 2015-07-29 ENCOUNTER — Inpatient Hospital Stay (HOSPITAL_COMMUNITY): Payer: 59

## 2015-07-29 DIAGNOSIS — J9311 Primary spontaneous pneumothorax: Secondary | ICD-10-CM

## 2015-07-29 DIAGNOSIS — J439 Emphysema, unspecified: Secondary | ICD-10-CM

## 2015-07-29 LAB — BASIC METABOLIC PANEL
ANION GAP: 8 (ref 5–15)
BUN: 14 mg/dL (ref 6–20)
CALCIUM: 8.9 mg/dL (ref 8.9–10.3)
CO2: 26 mmol/L (ref 22–32)
CREATININE: 0.87 mg/dL (ref 0.61–1.24)
Chloride: 103 mmol/L (ref 101–111)
GLUCOSE: 95 mg/dL (ref 65–99)
Potassium: 4.2 mmol/L (ref 3.5–5.1)
Sodium: 137 mmol/L (ref 135–145)

## 2015-07-29 LAB — CBC
HCT: 40 % (ref 39.0–52.0)
HEMOGLOBIN: 13.2 g/dL (ref 13.0–17.0)
MCH: 30.6 pg (ref 26.0–34.0)
MCHC: 33 g/dL (ref 30.0–36.0)
MCV: 92.8 fL (ref 78.0–100.0)
PLATELETS: 224 10*3/uL (ref 150–400)
RBC: 4.31 MIL/uL (ref 4.22–5.81)
RDW: 17.4 % — ABNORMAL HIGH (ref 11.5–15.5)
WBC: 7 10*3/uL (ref 4.0–10.5)

## 2015-07-29 MED ORDER — METHOCARBAMOL 500 MG PO TABS
500.0000 mg | ORAL_TABLET | Freq: Three times a day (TID) | ORAL | Status: AC | PRN
  Administered 2015-07-30 – 2015-07-31 (×3): 500 mg via ORAL
  Filled 2015-07-29 (×3): qty 1

## 2015-07-29 NOTE — Progress Notes (Signed)
[facility] PROGRESS NOTE  Gabriel [name] JAS:[id number] DOB: 1941-02-04 DOA: 07/28/2015 PCP: No PCP Per Patient  Assessment/Plan: 1. Spontaneous pneumothorax -Gabriel [name] having a history of lung cancer, chronic obstructive pulmonary disease, active tobacco abuse -I think that his underlying structural lung disease likely contributing to development of spontaneous pneumothorax -In the emergency department he underwent placement of a chest tube -Repeat chest x-ray today showing a stable less than 10% right pneumothorax -Cardiothoracic surgery following.  2.  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 Julien Nordmann of medical oncology. -Status post treatment with chemoradiation therapy, carboplatin and paclitaxel having partial response, plan for him to initiate therapy with carboplatin and gemcitabine on 08/08/2015  3.  Chronic objective pulmonary disease. -No wheeze on exam, does not appear to have acute issues regarding COPD.   Code Status: Full code Family Communication:  Disposition Plan:    Consultants:  Cardiothoracic surgery  Procedures:  Chest tube placement performed on 07/28/2015   HPI/Subjective: Gabriel [name] is a pleasant 74 year old woman with a past medical history of stage III non-small cell carcinoma of right lung, undergoing chemoradiation therapy at the cancer center, also had a history of right sided spontaneous pneumothorax in June 2016 treated with chest tube placement at that time, admitted to medicine service on 07/28/2015 when he present with complaints of dyspnea on exertion. X-ray in the emergency department revealed a recurrent right-sided spontaneous pneumothorax. He was seen and evaluated by cardiothoracic surgery undergoing chest tube placement.  Objective: Filed Vitals:   07/29/15 1259  BP: 98/64  Pulse: 84  Temp: 98 F (36.7 C)  Resp: 18    Intake/Output Summary (Last 24 hours) at 07/29/15  1623 Last data filed at 07/29/15 0845  Gross per 24 hour  Intake    240 ml  Output   1300 ml  Net  -1060 ml   Filed Weights   07/28/15 0652 07/28/15 1039  Weight: 61.236 kg (135 lb) 60.1 kg (132 lb 7.9 oz)    Exam:   General:  Gabriel [name] is in no acute distress, awake and alert, states doing well has no complaints.  Cardiovascular: Regular rate and rhythm normal S1-S2 no murmurs rubs or gallops  Respiratory: Right lung having good air movement, no wheezing rhonchi or rales. Left lung clear. Chest tube in place.  Abdomen: Soft nontender nondistended  Musculoskeletal: No edema  Data Reviewed: Basic Metabolic Panel:  Recent Labs Lab 07/25/15 1039 07/28/15 0720 07/29/15 0510  NA 138 136 137  K 4.2 5.3* 4.2  CL  --  105 103  CO2 '25 23 26  '$ GLUCOSE 111 107* 95  BUN 19.'2 20 14  '$ CREATININE 0.9 0.90 0.87  CALCIUM 9.7 9.3 8.9   Liver Function Tests:  Recent Labs Lab 07/25/15 1039  AST 18  ALT 20  ALKPHOS 98  BILITOT 0.34  PROT 7.4  ALBUMIN 3.8   No results for input(s): LIPASE, AMYLASE in the last 168 hours. No results for input(s): AMMONIA in the last 168 hours. CBC:  Recent Labs Lab 07/25/15 1039 07/28/15 0720 07/29/15 0510  WBC 10.3 7.8 7.0  NEUTROABS 8.3* 5.8  --   HGB 14.7 14.6 13.2  HCT 43.7 41.1 40.0  MCV 91.6 91.5 92.8  PLT 298 244 224   Cardiac Enzymes: No results for input(s): CKTOTAL, CKMB, CKMBINDEX, TROPONINI in the last 168 hours. BNP (last 3 results)  Recent Labs  04/04/15 1831 07/28/15 0720  BNP 53.8 42.7  ProBNP (last 3 results) No results for input(s): PROBNP in the last 8760 hours.  CBG: No results for input(s): GLUCAP in the last 168 hours.  No results found for this or any previous visit (from the past 240 hour(s)).   Studies: Dg Chest Port 1 View  07/29/2015   CLINICAL DATA:  Pneumothorax and right chest tube  EXAM: PORTABLE CHEST 1 VIEW  COMPARISON:  Yesterday  FINDINGS: Right basilar chest tube has a more  horizontal appearance but still overlaps the lower chest to the same degree. Small right apical and basilar pneumothorax is unchanged, less than 10%. Increase in right basilar atelectasis. Background of emphysema. No edema, pneumonia, or effusion. Remote left rib fractures. Normal heart size.  IMPRESSION: 1. Stable small, less than 10%, right pneumothorax. 2. Shifted chest tube but still overlapping the lower chest.   Electronically Signed   By: Monte Fantasia M.D.   On: 07/29/2015 06:58   Dg Chest Portable 1 View  07/28/2015   CLINICAL DATA:  74 year old male with right-sided chest tube for pneumothorax  EXAM: PORTABLE CHEST 1 VIEW  COMPARISON:  Prior chest x-ray 07/28/2015  FINDINGS: Right-sided thoracostomy tube has been placed. There has been partial interval re-expansion of the lung. A small (less than 10%) residual pneumothorax remains. Improving atelectasis in the right middle and lower lobes. Background chronic parenchymal lung changes again noted. Cardiac and mediastinal contours are unchanged right infrahilar mass again noted. No left-sided pneumothorax. No acute osseous abnormality.  IMPRESSION: 1. Significantly improved right-sided pneumothorax status post placement of thoracostomy tube. There is a small (less than 10%) residual pneumothorax. 2. Improving right lower and middle lobe atelectasis.   Electronically Signed   By: Jacqulynn Cadet M.D.   On: 07/28/2015 09:51   Dg Chest Portable 1 View  07/28/2015   CLINICAL DATA:  Short of breath.  EXAM: PORTABLE CHEST 1 VIEW  COMPARISON:  05/18/2015  FINDINGS: Large right pneumothorax approximately 60%. There is collapse in the right lower lobe. No significant effusion.  Increased interstitial markings in the left lung likely due to mild interstitial edema and vascular congestion. No edema or effusion on the left.  Underlying chronic lung disease and COPD.  IMPRESSION: 60% right pneumothorax.  Critical Value/emergent results were called by telephone  at the time of interpretation on 07/28/2015 at 7:51 am to Dr. Gareth Morgan , who verbally acknowledged these results.   Electronically Signed   By: Franchot Gallo M.D.   On: 07/28/2015 07:52    Scheduled Meds: . enoxaparin (LOVENOX) injection  40 mg Subcutaneous Q24H  . sodium chloride  3 mL Intravenous Q12H  . sodium chloride  3 mL Intravenous Q12H   Continuous Infusions:   Principal Problem:   Pneumothorax Active Problems:   Non-small cell carcinoma of right lung, stage 3   COPD (chronic obstructive pulmonary disease)   Lung cancer    Time spent: 15 min    [name]  [facility] Pager [phone]. If 7PM-7AM, please contact night-coverage at [email or web address], password [facility] 07/29/2015, 4:23 PM  LOS: 1 day

## 2015-07-29 NOTE — Progress Notes (Signed)
Patient ID: Gabriel [name], male   DOB: Feb 03, 1941, 74 y.o.   MRN: [id number]      [address]       [place] [address]             [phone]                      LOS: 1 day   Subjective: stable chest tube in place   Objective: Vital signs in last 24 hours: Patient Vitals for the past 24 hrs:  BP Temp Temp src Pulse Resp SpO2  07/29/15 1259 98/64 mmHg 98 F (36.7 C) Oral 84 18 91 %  07/28/15 2243 135/76 mmHg 98.2 F (36.8 C) Oral 84 20 93 %    Filed Weights   07/28/15 0652 07/28/15 1039  Weight: 135 lb (61.236 kg) 132 lb 7.9 oz (60.1 kg)    Hemodynamic parameters for last 24 hours:    Intake/Output from previous day: 09/29 0701 - 09/30 0700 In: 1480 [P.O.:480; I.V.:1000] Out: 1105 [Urine:925; Chest Tube:180] Intake/Output this shift: Total I/O In: -  Out: 800 [Urine:800]  Scheduled Meds: . enoxaparin (LOVENOX) injection  40 mg Subcutaneous Q24H  . sodium chloride  3 mL Intravenous Q12H  . sodium chloride  3 mL Intravenous Q12H   Continuous Infusions:  PRN Meds:.sodium chloride, acetaminophen **OR** acetaminophen, alum & mag hydroxide-simeth, docusate sodium, morphine injection, ondansetron **OR** ondansetron (ZOFRAN) IV, oxyCODONE, sodium chloride  General appearance: alert and cooperative Neurologic: intact Heart: regular rate and rhythm, S1, S2 normal, no murmur, click, rub or gallop Lungs: diminished breath sounds RLL Abdomen: soft, non-tender; bowel sounds normal; no masses,  no organomegaly Extremities: extremities normal, atraumatic, no cyanosis or edema and Homans sign is negative, no sign of DVT Wound: 2-3+ air leak with cough  Lab Results: CBC: Recent Labs  07/28/15 0720 07/29/15 0510  WBC 7.8 7.0  HGB 14.6 13.2  HCT 41.1 40.0  PLT 244 224   BMET:  Recent Labs  07/28/15 0720 07/29/15 0510  NA 136 137  K 5.3* 4.2  CL 105 103  CO2 23 26  GLUCOSE 107* 95  BUN 20 14  CREATININE 0.90 0.87  CALCIUM 9.3 8.9      PT/INR: No results for input(s): LABPROT, INR in the last 72 hours.   Radiology Dg Chest Port 1 View  07/29/2015   CLINICAL DATA:  Pneumothorax and right chest tube  EXAM: PORTABLE CHEST 1 VIEW  COMPARISON:  Yesterday  FINDINGS: Right basilar chest tube has a more horizontal appearance but still overlaps the lower chest to the same degree. Small right apical and basilar pneumothorax is unchanged, less than 10%. Increase in right basilar atelectasis. Background of emphysema. No edema, pneumonia, or effusion. Remote left rib fractures. Normal heart size.  IMPRESSION: 1. Stable small, less than 10%, right pneumothorax. 2. Shifted chest tube but still overlapping the lower chest.   Electronically Signed   By: Monte Fantasia M.D.   On: 07/29/2015 06:58   Dg Chest Portable 1 View  07/28/2015   CLINICAL DATA:  74 year old male with right-sided chest tube for pneumothorax  EXAM: PORTABLE CHEST 1 VIEW  COMPARISON:  Prior chest x-ray 07/28/2015  FINDINGS: Right-sided thoracostomy tube has been placed. There has been partial interval re-expansion of the lung. A small (less than 10%) residual pneumothorax remains. Improving atelectasis in the right middle and lower lobes. Background chronic parenchymal lung changes again noted. Cardiac and mediastinal contours are unchanged  right infrahilar mass again noted. No left-sided pneumothorax. No acute osseous abnormality.  IMPRESSION: 1. Significantly improved right-sided pneumothorax status post placement of thoracostomy tube. There is a small (less than 10%) residual pneumothorax. 2. Improving right lower and middle lobe atelectasis.   Electronically Signed   By: Jacqulynn Cadet M.D.   On: 07/28/2015 09:51   Dg Chest Portable 1 View  07/28/2015   CLINICAL DATA:  Short of breath.  EXAM: PORTABLE CHEST 1 VIEW  COMPARISON:  05/18/2015  FINDINGS: Large right pneumothorax approximately 60%. There is collapse in the right lower lobe. No significant effusion.  Increased  interstitial markings in the left lung likely due to mild interstitial edema and vascular congestion. No edema or effusion on the left.  Underlying chronic lung disease and COPD.  IMPRESSION: 60% right pneumothorax.  Critical Value/emergent results were called by telephone at the time of interpretation on 07/28/2015 at 7:51 am to Dr. Gareth Morgan , who verbally acknowledged these results.   Electronically Signed   By: Franchot Gallo M.D.   On: 07/28/2015 07:52     Assessment/Plan: Persistent air leak with cough- leave on suction -20 and follow up chest xray in am  [name] 07/29/2015 6:26 PM

## 2015-07-30 ENCOUNTER — Inpatient Hospital Stay (HOSPITAL_COMMUNITY): Payer: 59

## 2015-07-30 LAB — CBC
HCT: 38.4 % — ABNORMAL LOW (ref 39.0–52.0)
HEMOGLOBIN: 13.4 g/dL (ref 13.0–17.0)
MCH: 32.2 pg (ref 26.0–34.0)
MCHC: 34.9 g/dL (ref 30.0–36.0)
MCV: 92.3 fL (ref 78.0–100.0)
PLATELETS: 190 10*3/uL (ref 150–400)
RBC: 4.16 MIL/uL — AB (ref 4.22–5.81)
RDW: 17.4 % — ABNORMAL HIGH (ref 11.5–15.5)
WBC: 9 10*3/uL (ref 4.0–10.5)

## 2015-07-30 LAB — BASIC METABOLIC PANEL
ANION GAP: 6 (ref 5–15)
BUN: 19 mg/dL (ref 6–20)
CHLORIDE: 104 mmol/L (ref 101–111)
CO2: 28 mmol/L (ref 22–32)
Calcium: 9 mg/dL (ref 8.9–10.3)
Creatinine, Ser: 0.88 mg/dL (ref 0.61–1.24)
GFR calc Af Amer: >60 mL/min (ref >60)
GLUCOSE: 89 mg/dL (ref 65–99)
POTASSIUM: 4.2 mmol/L (ref 3.5–5.1)
Sodium: 138 mmol/L (ref 135–145)

## 2015-07-30 MED ORDER — DIAZEPAM 5 MG PO TABS
5.0000 mg | ORAL_TABLET | Freq: Four times a day (QID) | ORAL | Status: DC | PRN
Start: 2015-07-30 — End: 2015-08-04
  Administered 2015-07-30 – 2015-08-04 (×11): 5 mg via ORAL
  Filled 2015-07-30 (×11): qty 1

## 2015-07-30 NOTE — Progress Notes (Addendum)
      [address]       [place] [address]             [phone]      Subjective:  Mr. Ndiaye complains of back spasms this morning.  He states the medication prescribed has not done much. He request valium be added as this has worked well for him in the past.   Objective: Vital signs in last 24 hours: Temp:  [97.7 F (36.5 C)-98.6 F (37 C)] 98.6 F (37 C) (10/01 0930) Pulse Rate:  [80-89] 80 (10/01 0930) Cardiac Rhythm:  [-] Normal sinus rhythm;Heart block (10/01 0701) Resp:  [18] 18 (10/01 0930) BP: (98-129)/(64-71) 129/70 mmHg (10/01 0930) SpO2:  [91 %-96 %] 96 % (10/01 0930)  Intake/Output from previous day: 09/30 0701 - 10/01 0700 In: -  Out: 830 [Urine:800; Chest Tube:30]  General appearance: alert, cooperative and no distress Heart: regular rate and rhythm Lungs: clear to auscultation bilaterally Abdomen: soft, non-tender; bowel sounds normal; no masses,  no organomegaly Wound: clean and dry  Lab Results:  Recent Labs  07/29/15 0510 07/30/15 0509  WBC 7.0 9.0  HGB 13.2 13.4  HCT 40.0 38.4*  PLT 224 190   BMET:  Recent Labs  07/29/15 0510 07/30/15 0509  NA 137 138  K 4.2 4.2  CL 103 104  CO2 26 28  GLUCOSE 95 89  BUN 14 19  CREATININE 0.87 0.88  CALCIUM 8.9 9.0    PT/INR: No results for input(s): LABPROT, INR in the last 72 hours. ABG    Component Value Date/Time   TCO2 22 04/04/2015 1839   CBG (last 3)  No results for input(s): GLUCAP in the last 72 hours.  Assessment/Plan:  1. Pneumothorax- chest tube in place with + air leak, CXR with stable appearance of pneumothorax- leave on suction today, repeat CXR in AM 2. Back Spasms- on Robaxin, instructed patient to give this a few hours to work, will add Valium prn 3. Dispo- leave chest tube to suction, repeat CXR in AM, care per primary   LOS: 2 days    BARRETT, ERIN 07/30/2015  I agree with the assessment and plan as outlined.  Rexene Alberts,  MD 07/30/2015 12:00 PM

## 2015-07-30 NOTE — Progress Notes (Signed)
[facility] PROGRESS NOTE  Gabriel [name] GHW:[id number] DOB: January 31, 1941 DOA: 07/28/2015 PCP: No PCP Per Patient  Assessment/Plan: 1. Spontaneous pneumothorax -Gabriel [name] having a history of lung cancer, chronic obstructive pulmonary disease, active tobacco abuse -I think that his underlying structural lung disease likely contributing to development of spontaneous pneumothorax -In the emergency department he underwent placement of a chest tube -Repeat chest x-ray on 07/29/2015 showing a stable less than 10% right pneumothorax -Cardiothoracic surgery following.  2.  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 Julien Nordmann of medical oncology. -Status post treatment with chemoradiation therapy, carboplatin and paclitaxel having partial response, plan for him to initiate therapy with carboplatin and gemcitabine on 08/08/2015  3.  Chronic objective pulmonary disease. -No wheeze on exam, does not appear to have acute issues regarding COPD.   Code Status: Full code Family Communication:  Disposition Plan: Anticipate discharge home when medically stable   Consultants:  Cardiothoracic surgery  Procedures:  Chest tube placement performed on 07/28/2015   HPI/Subjective: Gabriel [name] is a pleasant 74 year old woman with a past medical history of stage III non-small cell carcinoma of right lung, undergoing chemoradiation therapy at the cancer center, also had a history of right sided spontaneous pneumothorax in June 2016 treated with chest tube placement at that time, admitted to medicine service on 07/28/2015 when he present with complaints of dyspnea on exertion. X-ray in the emergency department revealed a recurrent right-sided spontaneous pneumothorax. He was seen and evaluated by cardiothoracic surgery undergoing chest tube placement.  Objective: Filed Vitals:   07/30/15 1412  BP: 108/68  Pulse: 85  Temp: 97.6 F (36.4 C)  Resp: 16     Intake/Output Summary (Last 24 hours) at 07/30/15 1548 Last data filed at 07/30/15 0930  Gross per 24 hour  Intake    240 ml  Output      0 ml  Net    240 ml   Filed Weights   07/28/15 0652 07/28/15 1039  Weight: 61.236 kg (135 lb) 60.1 kg (132 lb 7.9 oz)    Exam:   General:  Gabriel [name] is in no acute distress, awake and alert, states doing well has no complaints.  Cardiovascular: Regular rate and rhythm normal S1-S2 no murmurs rubs or gallops  Respiratory: Right lung having good air movement, no wheezing rhonchi or rales. Left lung clear. Chest tube in place.  Abdomen: Soft nontender nondistended  Musculoskeletal: No edema  Data Reviewed: Basic Metabolic Panel:  Recent Labs Lab 07/25/15 1039 07/28/15 0720 07/29/15 0510 07/30/15 0509  NA 138 136 137 138  K 4.2 5.3* 4.2 4.2  CL  --  105 103 104  CO2 '25 23 26 28  '$ GLUCOSE 111 107* 95 89  BUN 19.'2 20 14 19  '$ CREATININE 0.9 0.90 0.87 0.88  CALCIUM 9.7 9.3 8.9 9.0   Liver Function Tests:  Recent Labs Lab 07/25/15 1039  AST 18  ALT 20  ALKPHOS 98  BILITOT 0.34  PROT 7.4  ALBUMIN 3.8   No results for input(s): LIPASE, AMYLASE in the last 168 hours. No results for input(s): AMMONIA in the last 168 hours. CBC:  Recent Labs Lab 07/25/15 1039 07/28/15 0720 07/29/15 0510 07/30/15 0509  WBC 10.3 7.8 7.0 9.0  NEUTROABS 8.3* 5.8  --   --   HGB 14.7 14.6 13.2 13.4  HCT 43.7 41.1 40.0 38.4*  MCV 91.6 91.5 92.8 92.3  PLT 298 244 224 190   Cardiac Enzymes:  No results for input(s): CKTOTAL, CKMB, CKMBINDEX, TROPONINI in the last 168 hours. BNP (last 3 results)  Recent Labs  04/04/15 1831 07/28/15 0720  BNP 53.8 42.7    ProBNP (last 3 results) No results for input(s): PROBNP in the last 8760 hours.  CBG: No results for input(s): GLUCAP in the last 168 hours.  No results found for this or any previous visit (from the past 240 hour(s)).   Studies: Dg Chest Port 1 View  07/30/2015   CLINICAL  DATA:  Chest tube, RIGHT lung cancer  EXAM: PORTABLE CHEST 1 VIEW  COMPARISON:  Radiograph 07/29/2015  FINDINGS: Normal cardiac silhouette. Chest tube in the RIGHT lower lobe with a focus of atelectasis or contusion. No interval change. Small RIGHT apical pneumothorax is unchanged in volume. No pulmonary edema.  IMPRESSION: 1. Small RIGHT apical pneumothorax with chest tube in place. 2. No interval change.   Electronically Signed   By: Suzy Bouchard M.D.   On: 07/30/2015 07:45   Dg Chest Port 1 View  07/29/2015   CLINICAL DATA:  Pneumothorax and right chest tube  EXAM: PORTABLE CHEST 1 VIEW  COMPARISON:  Yesterday  FINDINGS: Right basilar chest tube has a more horizontal appearance but still overlaps the lower chest to the same degree. Small right apical and basilar pneumothorax is unchanged, less than 10%. Increase in right basilar atelectasis. Background of emphysema. No edema, pneumonia, or effusion. Remote left rib fractures. Normal heart size.  IMPRESSION: 1. Stable small, less than 10%, right pneumothorax. 2. Shifted chest tube but still overlapping the lower chest.   Electronically Signed   By: Monte Fantasia M.D.   On: 07/29/2015 06:58    Scheduled Meds: . enoxaparin (LOVENOX) injection  40 mg Subcutaneous Q24H  . sodium chloride  3 mL Intravenous Q12H  . sodium chloride  3 mL Intravenous Q12H   Continuous Infusions:   Principal Problem:   Pneumothorax Active Problems:   Non-small cell carcinoma of right lung, stage 3   COPD (chronic obstructive pulmonary disease)   Lung cancer    Time spent: 15 min    [name]  [facility] Pager [phone]. If 7PM-7AM, please contact night-coverage at [email or web address], password [facility] 07/30/2015, 3:48 PM  LOS: 2 days

## 2015-07-31 ENCOUNTER — Inpatient Hospital Stay (HOSPITAL_COMMUNITY): Payer: 59

## 2015-07-31 NOTE — Progress Notes (Addendum)
      [address]       [place] [address]             [phone]      Subjective:  Mr. Heindl has no new complaints.  He states the Robaxin actually helped his muscle spasms.    Objective: Vital signs in last 24 hours: Temp:  [97.5 F (36.4 C)-97.6 F (36.4 C)] 97.6 F (36.4 C) (10/02 0602) Pulse Rate:  [81-85] 81 (10/02 0602) Cardiac Rhythm:  [-] Normal sinus rhythm (10/02 0700) Resp:  [16-18] 18 (10/02 0602) BP: (101-121)/(63-77) 101/63 mmHg (10/02 0602) SpO2:  [92 %-94 %] 92 % (10/02 0602)  Intake/Output from previous day: 10/01 0701 - 10/02 0700 In: 240 [P.O.:240] Out: 125 [Chest Tube:125]  General appearance: alert, cooperative and no distress Heart: regular rate and rhythm Lungs: clear to auscultation bilaterally Abdomen: soft, non-tender; bowel sounds normal; no masses,  no organomegaly Wound: clean and dry  Lab Results:  Recent Labs  07/29/15 0510 07/30/15 0509  WBC 7.0 9.0  HGB 13.2 13.4  HCT 40.0 38.4*  PLT 224 190   BMET:  Recent Labs  07/29/15 0510 07/30/15 0509  NA 137 138  K 4.2 4.2  CL 103 104  CO2 26 28  GLUCOSE 95 89  BUN 14 19  CREATININE 0.87 0.88  CALCIUM 8.9 9.0    PT/INR: No results for input(s): LABPROT, INR in the last 72 hours. ABG    Component Value Date/Time   TCO2 22 04/04/2015 1839   CBG (last 3)  No results for input(s): GLUCAP in the last 72 hours.  Assessment/Plan:  1. Chest tube- continued air leak with cough 5-6+, on 30 cm of suction- will leave chest tube there today 2. Pulm- CXR shows a 10% pneumothorax, which is worse from yesterday, will repeat in AM 3. Back Spasms- improved, continue Robaxin, Valium prn 4. Dispo- patient stable, leave chest tube on suction, repeat CXR in AM   LOS: 3 days    BARRETT, ERIN 07/31/2015  I agree with the assessment and plan as outlined.  Rexene Alberts, MD 07/31/2015 11:23 AM

## 2015-07-31 NOTE — Progress Notes (Signed)
[facility] PROGRESS NOTE  Gabriel [name] PFX:[id number] DOB: Apr 25, 1941 DOA: 07/28/2015 PCP: No PCP Per Patient  Assessment/Plan: 1. Spontaneous pneumothorax -Gabriel [name] having a history of lung cancer, chronic obstructive pulmonary disease, active tobacco abuse -I think that his underlying structural lung disease likely contributing to development of spontaneous pneumothorax -In the emergency department he underwent placement of a chest tube -On 07/31/2015 a repeat CXR showed interval increase in pneumothorax, now at 10%. -Cardiothoracic surgery recommending keeping chest tube in .   2.  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 Julien Nordmann of medical oncology. -Status post treatment with chemoradiation therapy, carboplatin and paclitaxel having partial response, plan for him to initiate therapy with carboplatin and gemcitabine on 08/08/2015  3.  Chronic objective pulmonary disease. -No wheeze on exam, does not appear to have acute issues regarding COPD.   Code Status: Full code Family Communication:  Disposition Plan: Anticipate discharge home when medically stable   Consultants:  Cardiothoracic surgery  Procedures:  Chest tube placement performed on 07/28/2015   HPI/Subjective: Gabriel [name] is a pleasant 74 year old woman with a past medical history of stage III non-small cell carcinoma of right lung, undergoing chemoradiation therapy at the cancer center, also had a history of right sided spontaneous pneumothorax in June 2016 treated with chest tube placement at that time, admitted to medicine service on 07/28/2015 when he present with complaints of dyspnea on exertion. X-ray in the emergency department revealed a recurrent right-sided spontaneous pneumothorax. He was seen and evaluated by cardiothoracic surgery undergoing chest tube placement.  Objective: Filed Vitals:   07/31/15 0602  BP: 101/63  Pulse: 81  Temp: 97.6 F  (36.4 C)  Resp: 18    Intake/Output Summary (Last 24 hours) at 07/31/15 1426 Last data filed at 07/31/15 0559  Gross per 24 hour  Intake      0 ml  Output    125 ml  Net   -125 ml   Filed Weights   07/28/15 0652 07/28/15 1039  Weight: 61.236 kg (135 lb) 60.1 kg (132 lb 7.9 oz)    Exam:   General:  Gabriel [name] is in no acute distress, awake and alert, states doing well has no complaints.  Cardiovascular: Regular rate and rhythm normal S1-S2 no murmurs rubs or gallops  Respiratory: Right lung having good air movement, no wheezing rhonchi or rales. Left lung clear. Chest tube in place.  Abdomen: Soft nontender nondistended  Musculoskeletal: No edema  Data Reviewed: Basic Metabolic Panel:  Recent Labs Lab 07/25/15 1039 07/28/15 0720 07/29/15 0510 07/30/15 0509  NA 138 136 137 138  K 4.2 5.3* 4.2 4.2  CL  --  105 103 104  CO2 '25 23 26 28  '$ GLUCOSE 111 107* 95 89  BUN 19.'2 20 14 19  '$ CREATININE 0.9 0.90 0.87 0.88  CALCIUM 9.7 9.3 8.9 9.0   Liver Function Tests:  Recent Labs Lab 07/25/15 1039  AST 18  ALT 20  ALKPHOS 98  BILITOT 0.34  PROT 7.4  ALBUMIN 3.8   No results for input(s): LIPASE, AMYLASE in the last 168 hours. No results for input(s): AMMONIA in the last 168 hours. CBC:  Recent Labs Lab 07/25/15 1039 07/28/15 0720 07/29/15 0510 07/30/15 0509  WBC 10.3 7.8 7.0 9.0  NEUTROABS 8.3* 5.8  --   --   HGB 14.7 14.6 13.2 13.4  HCT 43.7 41.1 40.0 38.4*  MCV 91.6 91.5 92.8 92.3  PLT 298 244 224  190   Cardiac Enzymes: No results for input(s): CKTOTAL, CKMB, CKMBINDEX, TROPONINI in the last 168 hours. BNP (last 3 results)  Recent Labs  04/04/15 1831 07/28/15 0720  BNP 53.8 42.7    ProBNP (last 3 results) No results for input(s): PROBNP in the last 8760 hours.  CBG: No results for input(s): GLUCAP in the last 168 hours.  No results found for this or any previous visit (from the past 240 hour(s)).   Studies: Dg Chest Port 1  View  07/31/2015   CLINICAL DATA:  Pneumothorax.  Lung cancer  EXAM: PORTABLE CHEST 1 VIEW  COMPARISON:  07/30/2015  FINDINGS: Right chest tube in place. Increased right pneumothorax now approximately 10%.  Slight increase in right lower lobe atelectasis. Right hilar lung mass unchanged. Negative for heart failure. COPD.  IMPRESSION: Interval increase in right apical pneumothorax now approximately 10%.  Increased right lower lobe atelectasis.   Electronically Signed   By: Franchot Gallo M.D.   On: 07/31/2015 07:16   Dg Chest Port 1 View  07/30/2015   CLINICAL DATA:  Chest tube, RIGHT lung cancer  EXAM: PORTABLE CHEST 1 VIEW  COMPARISON:  Radiograph 07/29/2015  FINDINGS: Normal cardiac silhouette. Chest tube in the RIGHT lower lobe with a focus of atelectasis or contusion. No interval change. Small RIGHT apical pneumothorax is unchanged in volume. No pulmonary edema.  IMPRESSION: 1. Small RIGHT apical pneumothorax with chest tube in place. 2. No interval change.   Electronically Signed   By: Suzy Bouchard M.D.   On: 07/30/2015 07:45    Scheduled Meds: . enoxaparin (LOVENOX) injection  40 mg Subcutaneous Q24H  . sodium chloride  3 mL Intravenous Q12H  . sodium chloride  3 mL Intravenous Q12H   Continuous Infusions:   Principal Problem:   Pneumothorax Active Problems:   Non-small cell carcinoma of right lung, stage 3 (HCC)   COPD (chronic obstructive pulmonary disease) ([place])   Lung cancer ([place])    Time spent: 15 min    [name]  [facility] Pager [phone]. If 7PM-7AM, please contact night-coverage at [email or web address], password [facility] 07/31/2015, 2:26 PM  LOS: 3 days

## 2015-08-01 ENCOUNTER — Inpatient Hospital Stay (HOSPITAL_COMMUNITY): Payer: 59

## 2015-08-01 DIAGNOSIS — J9311 Primary spontaneous pneumothorax: Secondary | ICD-10-CM

## 2015-08-01 DIAGNOSIS — J42 Unspecified chronic bronchitis: Secondary | ICD-10-CM

## 2015-08-01 LAB — COMPREHENSIVE METABOLIC PANEL
ALT: 19 U/L (ref 17–63)
AST: 21 U/L (ref 15–41)
Albumin: 3.3 g/dL — ABNORMAL LOW (ref 3.5–5.0)
Alkaline Phosphatase: 79 U/L (ref 38–126)
Anion gap: 7 (ref 5–15)
BUN: 14 mg/dL (ref 6–20)
CO2: 27 mmol/L (ref 22–32)
Calcium: 9.1 mg/dL (ref 8.9–10.3)
Chloride: 104 mmol/L (ref 101–111)
Creatinine, Ser: 0.9 mg/dL (ref 0.61–1.24)
GFR calc Af Amer: >60 mL/min (ref >60)
GFR calc non Af Amer: >60 mL/min (ref >60)
Glucose, Bld: 93 mg/dL (ref 65–99)
Potassium: 3.7 mmol/L (ref 3.5–5.1)
Sodium: 138 mmol/L (ref 135–145)
Total Bilirubin: 0.3 mg/dL (ref 0.3–1.2)
Total Protein: 6.1 g/dL — ABNORMAL LOW (ref 6.5–8.1)

## 2015-08-01 LAB — CBC
HCT: 39.7 % (ref 39.0–52.0)
Hemoglobin: 13.1 g/dL (ref 13.0–17.0)
MCH: 30.4 pg (ref 26.0–34.0)
MCHC: 33 g/dL (ref 30.0–36.0)
MCV: 92.1 fL (ref 78.0–100.0)
Platelets: 180 10*3/uL (ref 150–400)
RBC: 4.31 MIL/uL (ref 4.22–5.81)
RDW: 16.7 % — ABNORMAL HIGH (ref 11.5–15.5)
WBC: 7.1 10*3/uL (ref 4.0–10.5)

## 2015-08-01 LAB — PROTIME-INR
INR: 1.1 (ref 0.00–1.49)
Prothrombin Time: 14.4 seconds (ref 11.6–15.2)

## 2015-08-01 LAB — APTT: aPTT: 34 seconds (ref 24–37)

## 2015-08-01 NOTE — Progress Notes (Signed)
Patient ID: Gabriel [name], male   DOB: 1941/06/10, 73 y.o.   MRN: [id number]      [address]       [place] [address]             [phone]                   Procedure(s) (LRB): VIDEO BRONCHOSCOPY WITH POSSIBLE INSERTION OF INTERBRONCHIAL VALVE (IBV) (N/A)  LOS: 4 days   Subjective: Some right chest wall tenderness  Objective: Vital signs in last 24 hours: Patient Vitals for the past 24 hrs:  BP Temp Temp src Pulse Resp SpO2  08/01/15 1815 135/73 mmHg 97.9 F (36.6 C) Oral 80 16 96 %  08/01/15 1500 112/68 mmHg 97.9 F (36.6 C) Oral 83 18 93 %  08/01/15 0454 111/71 mmHg 97.7 F (36.5 C) Oral 80 16 92 %  07/31/15 2052 110/73 mmHg 97.5 F (36.4 C) Oral 87 20 94 %    Filed Weights   07/28/15 0652 07/28/15 1039  Weight: 135 lb (61.236 kg) 132 lb 7.9 oz (60.1 kg)    Hemodynamic parameters for last 24 hours:    Intake/Output from previous day: 10/02 0701 - 10/03 0700 In: -  Out: 100 [Chest Tube:100] Intake/Output this shift:    Scheduled Meds: . enoxaparin (LOVENOX) injection  40 mg Subcutaneous Q24H  . sodium chloride  3 mL Intravenous Q12H  . sodium chloride  3 mL Intravenous Q12H   Continuous Infusions:  PRN Meds:.sodium chloride, acetaminophen **OR** acetaminophen, alum & mag hydroxide-simeth, diazepam, docusate sodium, morphine injection, ondansetron **OR** ondansetron (ZOFRAN) IV, oxyCODONE, sodium chloride  General appearance: alert and cooperative Neurologic: intact Heart: regular rate and rhythm, S1, S2 normal, no murmur, click, rub or gallop Lungs: rhonchi RLL and RML Abdomen: soft, non-tender; bowel sounds normal; no masses,  no organomegaly Extremities: extremities normal, atraumatic, no cyanosis or edema and Homans sign is negative, no sign of DVT Wound: increased air leak from two days ago   Lab Results: CBC: Recent Labs  07/30/15 0509  WBC 9.0  HGB 13.4  HCT 38.4*  PLT 190   BMET:  Recent Labs  07/30/15 0509  NA  138  K 4.2  CL 104  CO2 28  GLUCOSE 89  BUN 19  CREATININE 0.88  CALCIUM 9.0    PT/INR: No results for input(s): LABPROT, INR in the last 72 hours.   Radiology Dg Chest Port 1 View  08/01/2015   CLINICAL DATA:  Pneumothorax.  EXAM: PORTABLE CHEST 1 VIEW  COMPARISON:  07/31/2015.  CT 07/22/2015  FINDINGS: Right chest tube in stable position. Stable tiny right apical pneumothorax. Heart size stable. Right hilar mass unchanged. Persistent right base subsegmental atelectasis. Represent made to recent CT report for discussion of pulmonary nodules. No prominent pleural effusion. Old left posterior rib fractures.  IMPRESSION: 1. Right chest tube in stable position. Stable right apical pneumothorax. 2. Right hilar mass unchanged.  Persistent bibasilar atelectasis.   Electronically Signed   By: Golden   On: 08/01/2015 07:32   Dg Chest Port 1 View  07/31/2015   CLINICAL DATA:  Pneumothorax.  Lung cancer  EXAM: PORTABLE CHEST 1 VIEW  COMPARISON:  07/30/2015  FINDINGS: Right chest tube in place. Increased right pneumothorax now approximately 10%.  Slight increase in right lower lobe atelectasis. Right hilar lung mass unchanged. Negative for heart failure. COPD.  IMPRESSION: Interval increase in right apical pneumothorax now approximately 10%.  Increased right  lower lobe atelectasis.   Electronically Signed   By: Franchot Gallo M.D.   On: 07/31/2015 07:16     Assessment/Plan: S/P Procedure(s) (LRB): VIDEO BRONCHOSCOPY WITH POSSIBLE INSERTION OF INTERBRONCHIAL VALVE (IBV) (N/A) With persistent air leak with tube on increased suction and underlying unresectable lung ca I have discussed with patient , son and daughter placement of IBV valves to try to stop or decrease air leak without major  operative procedure. The limitations of FDA approval for post surgical cases  discussed with patient . Risks and options discussed. Patient agreeable with proceeding.  [name] 08/01/2015 7:06  PM

## 2015-08-01 NOTE — Progress Notes (Signed)
Date:  Oct. 03, 2016 U.R. performed for needs and level of care. Will continue to follow for Case Management needs.  Velva Harman, RN, BSN, [place]   [phone]

## 2015-08-01 NOTE — Progress Notes (Signed)
[facility] PROGRESS NOTE  Gabriel [name]:[id number] DOB: 1940/11/15 DOA: 07/28/2015 PCP: No PCP Per Patient  Assessment/Plan: 1. Spontaneous pneumothorax -Gabriel [name] having a history of lung cancer, chronic obstructive pulmonary disease, active tobacco abuse -I think that his underlying structural lung disease likely contributing to development of spontaneous pneumothorax -In the emergency department he underwent placement of a chest tube -On 07/31/2015 a repeat CXR showed interval increase in pneumothorax, now at 10%. -He continues to have air leak with series CXR's showing interval increase in Pneumothorax. Cardiothoracic surgery recommending transfer to [facility] where he will likely undergo surgery.   2.  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 Julien Nordmann of medical oncology. -Status post treatment with chemoradiation therapy, carboplatin and paclitaxel having partial response, plan for him to initiate therapy with carboplatin and gemcitabine on 08/08/2015  3.  Chronic objective pulmonary disease. -No wheeze on exam, does not appear to have acute issues regarding COPD.   Code Status: Full code Family Communication:  Disposition Plan: Anticipate discharge home when medically stable   Consultants:  Cardiothoracic surgery  Procedures:  Chest tube placement performed on 07/28/2015   HPI/Subjective: Gabriel [name] is a pleasant 74 year old woman with a past medical history of stage III non-small cell carcinoma of right lung, undergoing chemoradiation therapy at the cancer center, also had a history of right sided spontaneous pneumothorax in June 2016 treated with chest tube placement at that time, admitted to medicine service on 07/28/2015 when he present with complaints of dyspnea on exertion. X-ray in the emergency department revealed a recurrent right-sided spontaneous pneumothorax. He was seen and evaluated by  cardiothoracic surgery undergoing chest tube placement. Serial CXR's revealed interval increase in Pneumothorax. On 08/01/2015 Dr Roxy Manns recommended he be transferred to [facility] where he would likely undergo surgical intervention.   Objective: Filed Vitals:   08/01/15 1500  BP: 112/68  Pulse: 83  Temp: 97.9 F (36.6 C)  Resp: 18    Intake/Output Summary (Last 24 hours) at 08/01/15 1729 Last data filed at 08/01/15 4259  Gross per 24 hour  Intake      0 ml  Output    150 ml  Net   -150 ml   Filed Weights   07/28/15 0652 07/28/15 1039  Weight: 61.236 kg (135 lb) 60.1 kg (132 lb 7.9 oz)    Exam:   General:  Mr. Griesinger is in no acute distress, awake and alert, states doing well has no complaints.  Cardiovascular: Regular rate and rhythm normal S1-S2 no murmurs rubs or gallops  Respiratory: Right lung having good air movement, no wheezing rhonchi or rales. Left lung clear. Chest tube in place.  Abdomen: Soft nontender nondistended  Musculoskeletal: No edema  Data Reviewed: Basic Metabolic Panel:  Recent Labs Lab 07/28/15 0720 07/29/15 0510 07/30/15 0509  NA 136 137 138  K 5.3* 4.2 4.2  CL 105 103 104  CO2 '23 26 28  '$ GLUCOSE 107* 95 89  BUN '20 14 19  '$ CREATININE 0.90 0.87 0.88  CALCIUM 9.3 8.9 9.0   Liver Function Tests: No results for input(s): AST, ALT, ALKPHOS, BILITOT, PROT, ALBUMIN in the last 168 hours. No results for input(s): LIPASE, AMYLASE in the last 168 hours. No results for input(s): AMMONIA in the last 168 hours. CBC:  Recent Labs Lab 07/28/15 0720 07/29/15 0510 07/30/15 0509  WBC 7.8 7.0 9.0  NEUTROABS 5.8  --   --   HGB 14.6  13.2 13.4  HCT 41.1 40.0 38.4*  MCV 91.5 92.8 92.3  PLT 244 224 190   Cardiac Enzymes: No results for input(s): CKTOTAL, CKMB, CKMBINDEX, TROPONINI in the last 168 hours. BNP (last 3 results)  Recent Labs  04/04/15 1831 07/28/15 0720  BNP 53.8 42.7    ProBNP (last 3 results) No results for  input(s): PROBNP in the last 8760 hours.  CBG: No results for input(s): GLUCAP in the last 168 hours.  No results found for this or any previous visit (from the past 240 hour(s)).   Studies: Dg Chest Port 1 View  08/01/2015   CLINICAL DATA:  Pneumothorax.  EXAM: PORTABLE CHEST 1 VIEW  COMPARISON:  07/31/2015.  CT 07/22/2015  FINDINGS: Right chest tube in stable position. Stable tiny right apical pneumothorax. Heart size stable. Right hilar mass unchanged. Persistent right base subsegmental atelectasis. Represent made to recent CT report for discussion of pulmonary nodules. No prominent pleural effusion. Old left posterior rib fractures.  IMPRESSION: 1. Right chest tube in stable position. Stable right apical pneumothorax. 2. Right hilar mass unchanged.  Persistent bibasilar atelectasis.   Electronically Signed   By: [place]   On: 08/01/2015 07:32   Dg Chest Port 1 View  07/31/2015   CLINICAL DATA:  Pneumothorax.  Lung cancer  EXAM: PORTABLE CHEST 1 VIEW  COMPARISON:  07/30/2015  FINDINGS: Right chest tube in place. Increased right pneumothorax now approximately 10%.  Slight increase in right lower lobe atelectasis. Right hilar lung mass unchanged. Negative for heart failure. COPD.  IMPRESSION: Interval increase in right apical pneumothorax now approximately 10%.  Increased right lower lobe atelectasis.   Electronically Signed   By: Franchot Gallo M.D.   On: 07/31/2015 07:16    Scheduled Meds: . enoxaparin (LOVENOX) injection  40 mg Subcutaneous Q24H  . sodium chloride  3 mL Intravenous Q12H  . sodium chloride  3 mL Intravenous Q12H   Continuous Infusions:   Principal Problem:   Pneumothorax Active Problems:   Non-small cell carcinoma of right lung, stage 3 (HCC)   COPD (chronic obstructive pulmonary disease) ([place])   Lung cancer ([place])    Time spent: 15 min    Gabriel [name]  [facility] Pager [phone]. If 7PM-7AM, please contact night-coverage at [email or web address],  password [facility] 08/01/2015, 5:29 PM  LOS: 4 days

## 2015-08-01 NOTE — Progress Notes (Signed)
Patient arrived to 2w30 via stretcher. Transported from [employer] by [employer] . Patient alert and oriented X 4 and in no distress. Right chest tube placed to 30 cm H2O wall Sx.  Patient O2 sats 96% on room air.  Left lung UL/LL clear but faint gurgling sound noted on the Right UL/LL.  Intermittent air leak noted. Chest tube dressing changed.  PIV in Left for arm saline locked.  Patient placed on Tele. SR in 45s noted.  Abd soft and bowel sounds active X 4.  Patient reported pain score of 2/10.  Requested pain medication now and stool softener before bed.  Will continue to monitor patient until shift change.

## 2015-08-02 ENCOUNTER — Encounter (HOSPITAL_COMMUNITY): Payer: Self-pay | Admitting: Certified Registered Nurse Anesthetist

## 2015-08-02 ENCOUNTER — Inpatient Hospital Stay (HOSPITAL_COMMUNITY): Payer: 59

## 2015-08-02 ENCOUNTER — Inpatient Hospital Stay (HOSPITAL_COMMUNITY): Payer: 59 | Admitting: Certified Registered"

## 2015-08-02 ENCOUNTER — Encounter (HOSPITAL_COMMUNITY)
Admission: EM | Disposition: A | Discharge status: Home Health | Payer: Self-pay | Source: Home / Self Care | Attending: Internal Medicine

## 2015-08-02 DIAGNOSIS — J9311 Primary spontaneous pneumothorax: Secondary | ICD-10-CM

## 2015-08-02 HISTORY — PX: VIDEO BRONCHOSCOPY WITH INSERTION OF INTERBRONCHIAL VALVE (IBV): SHX6178

## 2015-08-02 HISTORY — PX: CHEST TUBE INSERTION: SHX231

## 2015-08-02 LAB — SURGICAL PCR SCREEN
MRSA, PCR: NEGATIVE
Staphylococcus aureus: NEGATIVE

## 2015-08-02 LAB — GRAM STAIN

## 2015-08-02 SURGERY — BRONCHOSCOPY, FLEXIBLE, WITH INTRABRONCHIAL VALVE INSERTION
Anesthesia: General | Site: Chest | Laterality: Right

## 2015-08-02 MED ORDER — OXYCODONE HCL 5 MG PO TABS: 5.0000 mg | ORAL_TABLET | Freq: Once | ORAL | Status: DC | PRN

## 2015-08-02 MED ORDER — PROPOFOL 10 MG/ML IV BOLUS
INTRAVENOUS | Status: DC | PRN
  Administered 2015-08-02: 140 mg via INTRAVENOUS

## 2015-08-02 MED ORDER — PHENYLEPHRINE HCL 10 MG/ML IJ SOLN
INTRAMUSCULAR | Status: DC | PRN
  Administered 2015-08-02: 80 ug via INTRAVENOUS

## 2015-08-02 MED ORDER — FENTANYL CITRATE (PF) 100 MCG/2ML IJ SOLN
INTRAMUSCULAR | Status: DC | PRN
  Administered 2015-08-02: 100 ug via INTRAVENOUS

## 2015-08-02 MED ORDER — SUCCINYLCHOLINE CHLORIDE 20 MG/ML IJ SOLN
INTRAMUSCULAR | Status: AC
  Filled 2015-08-02: qty 1

## 2015-08-02 MED ORDER — FENTANYL CITRATE (PF) 100 MCG/2ML IJ SOLN
INTRAMUSCULAR | Status: AC
  Filled 2015-08-02: qty 2

## 2015-08-02 MED ORDER — PHENYLEPHRINE HCL 10 MG/ML IJ SOLN
10.0000 mg | INTRAMUSCULAR | Status: DC | PRN
Start: 2015-08-02 — End: 2015-08-02
  Administered 2015-08-02: 10 ug/min via INTRAVENOUS

## 2015-08-02 MED ORDER — EPHEDRINE SULFATE 50 MG/ML IJ SOLN
INTRAMUSCULAR | Status: AC
  Filled 2015-08-02: qty 1

## 2015-08-02 MED ORDER — LIDOCAINE HCL (CARDIAC) 20 MG/ML IV SOLN
INTRAVENOUS | Status: DC | PRN
  Administered 2015-08-02: 40 mg via INTRAVENOUS

## 2015-08-02 MED ORDER — PROPOFOL 10 MG/ML IV BOLUS
INTRAVENOUS | Status: AC
  Filled 2015-08-02: qty 20

## 2015-08-02 MED ORDER — MIDAZOLAM HCL 5 MG/5ML IJ SOLN
INTRAMUSCULAR | Status: DC | PRN
  Administered 2015-08-02 (×2): 1 mg via INTRAVENOUS

## 2015-08-02 MED ORDER — DEXAMETHASONE SODIUM PHOSPHATE 4 MG/ML IJ SOLN
INTRAMUSCULAR | Status: AC
  Filled 2015-08-02: qty 1

## 2015-08-02 MED ORDER — DEXAMETHASONE SODIUM PHOSPHATE 4 MG/ML IJ SOLN
INTRAMUSCULAR | Status: DC | PRN
  Administered 2015-08-02: 4 mg via INTRAVENOUS

## 2015-08-02 MED ORDER — FENTANYL CITRATE (PF) 100 MCG/2ML IJ SOLN
25.0000 ug | INTRAMUSCULAR | Status: DC | PRN
  Administered 2015-08-02: 25 ug via INTRAVENOUS

## 2015-08-02 MED ORDER — ROCURONIUM BROMIDE 100 MG/10ML IV SOLN
INTRAVENOUS | Status: DC | PRN
  Administered 2015-08-02: 40 mg via INTRAVENOUS

## 2015-08-02 MED ORDER — LACTATED RINGERS IV SOLN
INTRAVENOUS | Status: DC | PRN
  Administered 2015-08-02: 07:00:00 via INTRAVENOUS

## 2015-08-02 MED ORDER — SUGAMMADEX SODIUM 200 MG/2ML IV SOLN
INTRAVENOUS | Status: AC
  Filled 2015-08-02: qty 2

## 2015-08-02 MED ORDER — LIDOCAINE HCL (CARDIAC) 20 MG/ML IV SOLN
INTRAVENOUS | Status: AC
  Filled 2015-08-02: qty 5

## 2015-08-02 MED ORDER — HYDROMORPHONE HCL 1 MG/ML IJ SOLN
1.0000 mg | INTRAMUSCULAR | Status: DC | PRN
  Administered 2015-08-02: 1 mg via INTRAVENOUS
  Filled 2015-08-02: qty 1

## 2015-08-02 MED ORDER — LIDOCAINE HCL 4 % MT SOLN
OROMUCOSAL | Status: DC | PRN
  Administered 2015-08-02: 5 mL via TOPICAL

## 2015-08-02 MED ORDER — OXYCODONE HCL 5 MG PO TABS
ORAL_TABLET | ORAL | Status: AC
  Administered 2015-08-02: 5 mg
  Filled 2015-08-02: qty 1

## 2015-08-02 MED ORDER — OXYCODONE HCL 5 MG/5ML PO SOLN: 5.0000 mg | Freq: Once | ORAL | Status: DC | PRN

## 2015-08-02 MED ORDER — SUGAMMADEX SODIUM 200 MG/2ML IV SOLN
INTRAVENOUS | Status: DC | PRN
  Administered 2015-08-02: 150 mg via INTRAVENOUS

## 2015-08-02 MED ORDER — ONDANSETRON HCL 4 MG/2ML IJ SOLN: 4.0000 mg | Freq: Once | INTRAMUSCULAR | Status: DC | PRN

## 2015-08-02 MED ORDER — EPINEPHRINE HCL 1 MG/ML IJ SOLN
INTRAMUSCULAR | Status: AC
  Filled 2015-08-02: qty 1

## 2015-08-02 MED ORDER — MIDAZOLAM HCL 2 MG/2ML IJ SOLN
INTRAMUSCULAR | Status: AC
  Filled 2015-08-02: qty 4

## 2015-08-02 MED ORDER — SODIUM CHLORIDE 0.9 % IJ SOLN
INTRAMUSCULAR | Status: AC
  Filled 2015-08-02: qty 10

## 2015-08-02 MED ORDER — PHENYLEPHRINE 40 MCG/ML (10ML) SYRINGE FOR IV PUSH (FOR BLOOD PRESSURE SUPPORT)
PREFILLED_SYRINGE | INTRAVENOUS | Status: AC
  Filled 2015-08-02: qty 10

## 2015-08-02 MED ORDER — 0.9 % SODIUM CHLORIDE (POUR BTL) OPTIME
TOPICAL | Status: DC | PRN
  Administered 2015-08-02: 1000 mL

## 2015-08-02 MED ORDER — GLYCOPYRROLATE 0.2 MG/ML IJ SOLN
INTRAMUSCULAR | Status: DC | PRN
  Administered 2015-08-02: 0.2 mg via INTRAVENOUS

## 2015-08-02 MED ORDER — ONDANSETRON HCL 4 MG/2ML IJ SOLN
INTRAMUSCULAR | Status: AC
  Filled 2015-08-02: qty 2

## 2015-08-02 MED ORDER — FENTANYL CITRATE (PF) 250 MCG/5ML IJ SOLN
INTRAMUSCULAR | Status: AC
  Filled 2015-08-02: qty 5

## 2015-08-02 SURGICAL SUPPLY — 36 items
CANISTER SUCTION 2500CC (MISCELLANEOUS) ×3 IMPLANT
CATH BALLN 4FR (CATHETERS) ×3 IMPLANT
CATH EMB 4FR 80CM (CATHETERS) IMPLANT
CATH EMB 5FR 80CM (CATHETERS) ×3 IMPLANT
CATH EMB 6FR 80CM (CATHETERS) ×3 IMPLANT
CATH THORACIC 28FR (CATHETERS) ×3 IMPLANT
CATH TROCAR 28FR (CATHETERS) IMPLANT
CONT SPEC 4OZ CLIKSEAL STRL BL (MISCELLANEOUS) ×3 IMPLANT
COVER TABLE BACK 60X90 (DRAPES) ×3 IMPLANT
DRSG AQUACEL AG ADV 3.5X14 (GAUZE/BANDAGES/DRESSINGS) ×3 IMPLANT
FILTER STRAW FLUID ASPIR (MISCELLANEOUS) IMPLANT
FORCEPS BIOP RJ4 1.8 (CUTTING FORCEPS) IMPLANT
GAUZE SPONGE 4X4 12PLY STRL (GAUZE/BANDAGES/DRESSINGS) IMPLANT
GLOVE BIO SURGEON STRL SZ 6.5 (GLOVE) ×3 IMPLANT
GLOVE SURG SS PI 7.0 STRL IVOR (GLOVE) ×3 IMPLANT
GOWN STRL REUS W/ TWL LRG LVL3 (GOWN DISPOSABLE) ×4 IMPLANT
GOWN STRL REUS W/TWL LRG LVL3 (GOWN DISPOSABLE) ×2
KIT AIRWAY SIZING W/B5-23 BALL (VALVE) ×3 IMPLANT
KIT CLEAN ENDO COMPLIANCE (KITS) ×3 IMPLANT
KIT ROOM TURNOVER OR (KITS) ×3 IMPLANT
MARKER SKIN DUAL TIP RULER LAB (MISCELLANEOUS) ×3 IMPLANT
NS IRRIG 1000ML POUR BTL (IV SOLUTION) ×3 IMPLANT
OIL SILICONE PENTAX (PARTS (SERVICE/REPAIRS)) ×3 IMPLANT
PAD ARMBOARD 7.5X6 YLW CONV (MISCELLANEOUS) ×6 IMPLANT
SPONGE GAUZE 4X4 12PLY STER LF (GAUZE/BANDAGES/DRESSINGS) ×3 IMPLANT
STOPCOCK MORSE 400PSI 3WAY (MISCELLANEOUS) ×3 IMPLANT
SUT SILK  1 MH (SUTURE) ×1
SUT SILK 1 MH (SUTURE) ×2 IMPLANT
SYR 20ML ECCENTRIC (SYRINGE) ×3 IMPLANT
SYR 3ML LL SCALE MARK (SYRINGE) ×3 IMPLANT
SYR 5ML LL (SYRINGE) ×3 IMPLANT
SYRINGE 10CC LL (SYRINGE) ×3 IMPLANT
TAPE CLOTH SURG 4X10 WHT LF (GAUZE/BANDAGES/DRESSINGS) ×3 IMPLANT
TOWEL NATURAL 4PK STERILE (DISPOSABLE) ×3 IMPLANT
TRAP SPECIMEN MUCOUS 40CC (MISCELLANEOUS) ×3 IMPLANT
TUBE CONNECTING 20X1/4 (TUBING) ×3 IMPLANT

## 2015-08-02 NOTE — Transfer of Care (Signed)
Immediate Anesthesia Transfer of Care Note  Patient: Gabriel [name]  Procedure(s) Performed: Procedure(s): VIDEO BRONCHOSCOPY WITH ATTEMPTED INSERTION OF INTERBRONCHIAL VALVE (IBV) WITH FLUROSCOPY (N/A) INSERTION OF SECOND RIGHT CHEST TUBE WITH FLUROSCOPY (Right)  Patient Location: PACU  Anesthesia Type:General  Level of Consciousness: awake and alert   Airway & Oxygen Therapy: Patient Spontanous Breathing and Patient connected to nasal cannula oxygen  Post-op Assessment: Report given to RN and Post -op Vital signs reviewed and stable  Post vital signs: Reviewed and stable  Last Vitals:  Filed Vitals:   08/02/15 0933  BP: 108/63  Pulse: 90  Temp:   Resp: 24    Complications: No apparent anesthesia complications

## 2015-08-02 NOTE — Progress Notes (Signed)
[facility] PROGRESS NOTE  RASHAAN [name] XNT:[id number] DOB: August 24, 1941 DOA: 07/28/2015 PCP: No PCP Per Patient   Assessment/Plan: 1. Spontaneous pneumothorax -[name] having a history of lung cancer, chronic obstructive pulmonary disease, active tobacco abuse -I think that his underlying structural lung disease most likely contributing to development of spontaneous pneumothorax -In the emergency department he underwent placement of a chest tube -Cardiothoracic surgeon on board and today he attempted insertion of intrabronchial valve but was unable to place. As such second chest tube was inserted  2.  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 Julien Nordmann of medical oncology. -Status post treatment with chemoradiation therapy, carboplatin and paclitaxel having partial response, plan for him to initiate therapy with carboplatin and gemcitabine on 08/08/2015  3.  Chronic objective pulmonary disease. -No wheeze on exam, does not appear to have acute issues regarding COPD.   Code Status: Full code Family Communication:  Disposition Plan: Once cleared for discharge by cardiothoracic surgeon   Consultants:  Cardiothoracic surgery  Procedures:  Chest tube placement performed on 07/28/2015   HPI/Subjective: Gabriel [name] is a pleasant 74 year old woman with a past medical history of stage III non-small cell carcinoma of right lung, undergoing chemoradiation therapy at the cancer center, also had a history of right sided spontaneous pneumothorax in June 2016 treated with chest tube placement at that time, admitted to medicine service on 07/28/2015 when he present with complaints of dyspnea on exertion. X-ray in the emergency department revealed a recurrent right-sided spontaneous pneumothorax. He was seen and evaluated by cardiothoracic surgery undergoing chest tube placement. Serial CXR's revealed interval increase in Pneumothorax. On 08/01/2015  Dr Roxy Manns recommended he be transferred to [facility] where he would likely undergo surgical intervention.   Patient has no new complaints today. States that he is currently breathing comfortably     Objective: Filed Vitals:   08/02/15 1346  BP: 120/66  Pulse: 82  Temp: 97.3 F (36.3 C)  Resp: 18    Intake/Output Summary (Last 24 hours) at 08/02/15 1826 Last data filed at 08/02/15 1630  Gross per 24 hour  Intake   1380 ml  Output    195 ml  Net   1185 ml   Filed Weights   07/28/15 0652 07/28/15 1039  Weight: 61.236 kg (135 lb) 60.1 kg (132 lb 7.9 oz)    Exam:   General:  Mr. [name] is in no acute distress, awake and alert, states doing well has no complaints.  Cardiovascular: Regular rate and rhythm normal S1-S2 no murmurs rubs or gallops  Respiratory: Right lung having good air movement, no wheezing rhonchi or rales. Left lung clear. Chest tube in place x 2  Abdomen: Soft nontender nondistended  Musculoskeletal: No edema  Data Reviewed: Basic Metabolic Panel:  Recent Labs Lab 07/28/15 0720 07/29/15 0510 07/30/15 0509 08/01/15 2044  NA 136 137 138 138  K 5.3* 4.2 4.2 3.7  CL 105 103 104 104  CO2 '23 26 28 27  '$ GLUCOSE 107* 95 89 93  BUN '20 14 19 14  '$ CREATININE 0.90 0.87 0.88 0.90  CALCIUM 9.3 8.9 9.0 9.1   Liver Function Tests:  Recent Labs Lab 08/01/15 2044  AST 21  ALT 19  ALKPHOS 79  BILITOT 0.3  PROT 6.1*  ALBUMIN 3.3*   No results for input(s): LIPASE, AMYLASE in the last 168 hours. No results for input(s): AMMONIA in the last 168 hours. CBC:  Recent Labs Lab 07/28/15  0720 07/29/15 0510 07/30/15 0509 08/01/15 2044  WBC 7.8 7.0 9.0 7.1  NEUTROABS 5.8  --   --   --   HGB 14.6 13.2 13.4 13.1  HCT 41.1 40.0 38.4* 39.7  MCV 91.5 92.8 92.3 92.1  PLT 244 224 190 180   Cardiac Enzymes: No results for input(s): CKTOTAL, CKMB, CKMBINDEX, TROPONINI in the last 168 hours. BNP (last 3 results)  Recent Labs  04/04/15 1831  07/28/15 0720  BNP 53.8 42.7    ProBNP (last 3 results) No results for input(s): PROBNP in the last 8760 hours.  CBG: No results for input(s): GLUCAP in the last 168 hours.  Recent Results (from the past 240 hour(s))  Surgical pcr screen     Status: None   Collection Time: 08/01/15 11:47 PM  Result Value Ref Range Status   MRSA, PCR NEGATIVE NEGATIVE Final   Staphylococcus aureus NEGATIVE NEGATIVE Final    Comment:        The Xpert SA Assay (FDA approved for NASAL specimens in patients over 85 years of age), is one component of a comprehensive surveillance program.  Test performance has been validated by [facility] for patients greater than or equal to 75 year old. It is not intended to diagnose infection nor to guide or monitor treatment.   Gram stain     Status: None   Collection Time: 08/02/15  8:26 AM  Result Value Ref Range Status   Specimen Description BRONCHIAL WASHINGS  Final   Special Requests NONE  Final   Gram Stain   Final    MODERATE WBC PRESENT, PREDOMINANTLY PMN FEW GRAM POSITIVE COCCI IN PAIRS FEW GRAM NEGATIVE COCCOBACILLI RARE GRAM POSITIVE RODS    Report Status 08/02/2015 FINAL  Final     Studies: Dg Chest Port 1 View  08/02/2015   CLINICAL DATA:  Status post intrabronchial valve placement  EXAM: CHEST RADIOGRAPH-ONE VIEW  COMPARISON:  August 01, 2015  FINDINGS: There are 2 chest tubes on the right. Pneumothorax on the right is slightly larger compared to 1 day prior. Is patchy consolidation in the right base. There is generalized interstitial prominence, likely reflecting chronic inflammatory type change. Heart size and pulmonary vascularity are normal. No adenopathy.  IMPRESSION: Pneumothorax on the right is slightly larger compared to 1 day prior. Two chest tubes are currently present on the right there is no tension component. There is persistent patchy consolidation right base. Left lung clear except for underlying interstitial prominence which  is stable and probably reflective of chronic inflammatory type change. No change in cardiac silhouette.   Electronically Signed   By: [name]   On: 08/02/2015 10:15   Dg Chest Port 1 View  08/01/2015   CLINICAL DATA:  Pneumothorax.  EXAM: PORTABLE CHEST 1 VIEW  COMPARISON:  07/31/2015.  CT 07/22/2015  FINDINGS: Right chest tube in stable position. Stable tiny right apical pneumothorax. Heart size stable. Right hilar mass unchanged. Persistent right base subsegmental atelectasis. Represent made to recent CT report for discussion of pulmonary nodules. No prominent pleural effusion. Old left posterior rib fractures.  IMPRESSION: 1. Right chest tube in stable position. Stable right apical pneumothorax. 2. Right hilar mass unchanged.  Persistent bibasilar atelectasis.   Electronically Signed   By: Marcello Moores  Register   On: 08/01/2015 07:32   Dg Fluoro Guide Cv Line-no Report  08/02/2015   CLINICAL DATA:    FLOURO GUIDE CV LINE  Fluoroscopy was utilized by the requesting physician.  No radiographic  interpretation.     Scheduled Meds: . enoxaparin (LOVENOX) injection  40 mg Subcutaneous Q24H  . fentaNYL      . sodium chloride  3 mL Intravenous Q12H  . sodium chloride  3 mL Intravenous Q12H   Continuous Infusions:   Principal Problem:   Pneumothorax Active Problems:   Non-small cell carcinoma of right lung, stage 3 (HCC)   COPD (chronic obstructive pulmonary disease) ([place])   Lung cancer (HCC)    Time spent: 15 min    Abass Misener, [facility] Pager [phone]. If 7PM-7AM, please contact night-coverage at [email or web address], password [facility] 08/02/2015, 6:26 PM  LOS: 5 days

## 2015-08-02 NOTE — Anesthesia Postprocedure Evaluation (Signed)
  Anesthesia Post-op Note  Patient: Gabriel [name]  Procedure(s) Performed: Procedure(s): VIDEO BRONCHOSCOPY WITH ATTEMPTED INSERTION OF INTERBRONCHIAL VALVE (IBV) WITH FLUROSCOPY (N/A) INSERTION OF SECOND RIGHT CHEST TUBE WITH FLUROSCOPY (Right)  Patient Location: PACU  Anesthesia Type:General  Level of Consciousness: awake, alert  and oriented  Airway and Oxygen Therapy: Patient Spontanous Breathing and Patient connected to nasal cannula oxygen  Post-op Pain: mild  Post-op Assessment: Post-op Vital signs reviewed, Patient's Cardiovascular Status Stable, Respiratory Function Stable, Patent Airway and Pain level controlled              Post-op Vital Signs: stable  Last Vitals:  Filed Vitals:   08/02/15 1024  BP: 118/61  Pulse: 80  Temp: 36.6 C  Resp: 18    Complications: No apparent anesthesia complications

## 2015-08-02 NOTE — Progress Notes (Signed)
Hot pack to S.W. Site redden hard swollen up on 2 pillows

## 2015-08-02 NOTE — Anesthesia Preprocedure Evaluation (Signed)
Anesthesia Evaluation  Patient identified by MRN, date of birth, ID band Patient awake    Reviewed: Allergy & Precautions, NPO status , Patient's Chart, lab work & pertinent test results  Airway Mallampati: II  TM Distance: >3 FB Neck ROM: Full    Dental  (+) Teeth Intact   Pulmonary Current Smoker,     + decreased breath sounds      Cardiovascular  Rhythm:Regular Rate:Normal     Neuro/Psych    GI/Hepatic   Endo/Other    Renal/GU      Musculoskeletal   Abdominal   Peds  Hematology   Anesthesia Other Findings   Reproductive/Obstetrics                             Anesthesia Physical Anesthesia Plan  ASA: III  Anesthesia Plan: General   Post-op Pain Management:    Induction: Intravenous  Airway Management Planned: Oral ETT  Additional Equipment:   Intra-op Plan:   Post-operative Plan: Extubation in OR  Informed Consent: I have reviewed the patients History and Physical, chart, labs and discussed the procedure including the risks, benefits and alternatives for the proposed anesthesia with the patient or authorized representative who has indicated his/her understanding and acceptance.     Plan Discussed with: CRNA and Anesthesiologist  Anesthesia Plan Comments:         Anesthesia Quick Evaluation

## 2015-08-02 NOTE — Brief Op Note (Signed)
      [address]       [place] [address]             [phone]       08/02/2015  9:35 AM  PATIENT:  Gabriel [name]  74 y.o. male  PRE-OPERATIVE DIAGNOSIS:  AIRLEAK RIGHT CHEST TUBE  POST-OPERATIVE DIAGNOSIS:  AIRLEAK RIGHT CHEST TUBE  PROCEDURE:  Procedure(s): VIDEO BRONCHOSCOPY WITH ATTEMPTED INSERTION OF INTERBRONCHIAL VALVE (IBV) WITH FLUROSCOPY (N/A) INSERTION OF SECOND RIGHT CHEST TUBE WITH FLUROSCOPY (Right)  SURGEON:  Surgeon(s) and Role:    * Grace Isaac, MD - Primary    ANESTHESIA:   general  EBL:     BLOOD ADMINISTERED:none  DRAINS: right chest tube placement   LOCAL MEDICATIONS USED:  NONE  SPECIMEN:  Source of Specimen:  bronchial washings  DISPOSITION OF SPECIMEN:  micro  COUNTS:  YES  DICTATION: .Dragon Dictation  PLAN OF CARE: inpatient  PATIENT DISPOSITION:  PACU - hemodynamically stable.   Delay start of Pharmacological VTE agent (>24hrs) due to surgical blood loss or risk of bleeding: no

## 2015-08-02 NOTE — Anesthesia Procedure Notes (Signed)
Procedure Name: Intubation Date/Time: 08/02/2015 8:12 AM Performed by: Raphael Gibney T Pre-anesthesia Checklist: Patient identified, Timeout performed, Emergency Drugs available, Suction available and Patient being monitored Patient Re-evaluated:Patient Re-evaluated prior to inductionOxygen Delivery Method: Circle system utilized Preoxygenation: Pre-oxygenation with 100% oxygen Intubation Type: IV induction Ventilation: Mask ventilation without difficulty Laryngoscope Size: Mac and 4 Grade View: Grade I Tube type: Oral Tube size: 9.0 mm Number of attempts: 1 Airway Equipment and Method: Stylet Placement Confirmation: ETT inserted through vocal cords under direct vision,  positive ETCO2,  CO2 detector and breath sounds checked- equal and bilateral Secured at: 21 cm Tube secured with: Tape Dental Injury: Teeth and Oropharynx as per pre-operative assessment

## 2015-08-02 NOTE — Progress Notes (Addendum)
Received patient from PACU, alert & oriented, c/o with pain right chest,  2 chest tubes in place.  HOB elevated, placed back on the monitor, family present with patient.   Very anxious, Valium '5mg'$  po now.  Mervyn Skeeters, RN

## 2015-08-03 ENCOUNTER — Encounter (HOSPITAL_COMMUNITY): Payer: Self-pay | Admitting: Cardiothoracic Surgery

## 2015-08-03 ENCOUNTER — Inpatient Hospital Stay (HOSPITAL_COMMUNITY): Payer: 59

## 2015-08-03 DIAGNOSIS — J449 Chronic obstructive pulmonary disease, unspecified: Secondary | ICD-10-CM

## 2015-08-03 MED ORDER — TIOTROPIUM BROMIDE MONOHYDRATE 18 MCG IN CAPS
18.0000 ug | ORAL_CAPSULE | Freq: Every day | RESPIRATORY_TRACT | Status: DC
  Administered 2015-08-04: 18 ug via RESPIRATORY_TRACT
  Filled 2015-08-03: qty 5

## 2015-08-03 MED ORDER — BUDESONIDE-FORMOTEROL FUMARATE 160-4.5 MCG/ACT IN AERO
2.0000 | INHALATION_SPRAY | Freq: Two times a day (BID) | RESPIRATORY_TRACT | Status: DC
  Administered 2015-08-04 – 2015-08-18 (×29): 2 via RESPIRATORY_TRACT
  Filled 2015-08-03 (×2): qty 6

## 2015-08-03 NOTE — Progress Notes (Addendum)
[address]       [place] [address]             [phone]      1 Day Post-Op Procedure(s) (LRB): VIDEO BRONCHOSCOPY WITH ATTEMPTED INSERTION OF INTERBRONCHIAL VALVE (IBV) WITH FLUROSCOPY (N/A) INSERTION OF SECOND RIGHT CHEST TUBE WITH FLUROSCOPY (Right) Subjective: Feels pretty well  Objective: Vital signs in last 24 hours: Temp:  [97.3 F (36.3 C)-97.9 F (36.6 C)] 97.7 F (36.5 C) (10/05 0522) Pulse Rate:  [74-90] 74 (10/05 0522) Cardiac Rhythm:  [-] Heart block (10/05 0700) Resp:  [16-24] 18 (10/05 0522) BP: (108-132)/(61-72) 132/72 mmHg (10/05 0522) SpO2:  [90 %-98 %] 95 % (10/05 0522)  Hemodynamic parameters for last 24 hours:    Intake/Output from previous day: 10/04 0701 - 10/05 0700 In: 1260 [P.O.:360; I.V.:900] Out: 375 [Urine:350; Blood:25] Intake/Output this shift: Total I/O In: -  Out: 170 [Chest Tube:170]  General appearance: alert, cooperative and no distress Heart: regular rate and rhythm Lungs: coarse BS on right  Lab Results:  Recent Labs  08/01/15 2044  WBC 7.1  HGB 13.1  HCT 39.7  PLT 180   BMET:  Recent Labs  08/01/15 2044  NA 138  K 3.7  CL 104  CO2 27  GLUCOSE 93  BUN 14  CREATININE 0.90  CALCIUM 9.1    PT/INR:  Recent Labs  08/01/15 2044  LABPROT 14.4  INR 1.10   ABG    Component Value Date/Time   TCO2 22 04/04/2015 1839   CBG (last 3)  No results for input(s): GLUCAP in the last 72 hours.  Meds Scheduled Meds: . enoxaparin (LOVENOX) injection  40 mg Subcutaneous Q24H  . sodium chloride  3 mL Intravenous Q12H  . sodium chloride  3 mL Intravenous Q12H   Continuous Infusions:  PRN Meds:.sodium chloride, acetaminophen **OR** acetaminophen, alum & mag hydroxide-simeth, diazepam, docusate sodium, HYDROmorphone (DILAUDID) injection, morphine injection, ondansetron **OR** ondansetron (ZOFRAN) IV, oxyCODONE, sodium chloride  Xrays Dg Chest Port 1 View  08/03/2015   CLINICAL DATA:   74 year old male, chest tube placement. Stage IIIA right lung adenocarcinoma. Initial encounter.  EXAM: PORTABLE CHEST 1 VIEW  COMPARISON:  08/02/2015 and earlier.  FINDINGS: Portable AP upright view at 0712 hours. Two right side chest tubes are stable. Patchy opacity at the right lung base along the course of the tubes is stable. Stable right pneumothorax, small to moderate. Pleural edge is also visible at the right lung base today.  Slightly lower lung volumes. Stable cardiac size and mediastinal contours. Stable left lung.  IMPRESSION: Mildly lower lung volumes today. Small to moderate right pneumothorax is stable with 2 right chest tubes remaining in place.   Electronically Signed   By: [name]   On: 08/03/2015 08:21   Dg Chest Port 1 View  08/02/2015   CLINICAL DATA:  Status post intrabronchial valve placement  EXAM: CHEST RADIOGRAPH-ONE VIEW  COMPARISON:  August 01, 2015  FINDINGS: There are 2 chest tubes on the right. Pneumothorax on the right is slightly larger compared to 1 day prior. Is patchy consolidation in the right base. There is generalized interstitial prominence, likely reflecting chronic inflammatory type change. Heart size and pulmonary vascularity are normal. No adenopathy.  IMPRESSION: Pneumothorax on the right is slightly larger compared to 1 day prior. Two chest tubes are currently present on the right there is no tension component. There is persistent patchy consolidation right base. Left lung clear  except for underlying interstitial prominence which is stable and probably reflective of chronic inflammatory type change. No change in cardiac silhouette.   Electronically Signed   By: [name]   On: 08/02/2015 10:15   Dg Fluoro Guide Cv Line-no Report  08/02/2015   CLINICAL DATA:    FLOURO GUIDE CV LINE  Fluoroscopy was utilized by the requesting physician.  No radiographic  interpretation.     Assessment/Plan: S/P Procedure(s) (LRB): VIDEO BRONCHOSCOPY WITH  ATTEMPTED INSERTION OF INTERBRONCHIAL VALVE (IBV) WITH FLUROSCOPY (N/A) INSERTION OF SECOND RIGHT CHEST TUBE WITH FLUROSCOPY (Right)   1 chest tubes in place- + air leak- the pntx is stable 2 cont medical management per primary svc   LOS: 6 days    [name],[name] 08/03/2015  Will place tubes to water seal today I have seen and examined Gabriel [name] and agree with the above assessment  and plan.  [name] [facility] [phone] Office [phone] 08/03/2015 12:09 PM

## 2015-08-03 NOTE — Progress Notes (Signed)
[facility] PROGRESS NOTE  Gabriel [name] ZDG:[id number] DOB: 22-May-1941 DOA: 07/28/2015 PCP: No PCP Per Patient  Assessment/Plan: #1 recurrent pneumothorax Questionable etiology. May be secondary to history of lung cancer, COPD and ongoing tobacco abuse. Patient status post video bronchoscopy with attempted insertion of intrabronchial valve with fluoroscopy, insertion of second right chest tube 08/02/2015. Chest x-ray this morning with stable pneumothorax. Chest using place. Per cardiothoracic surgery.  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 Earlie Server of oncology. Patient status post treatment with chemotherapy and radiation therapy, carboplatin and paclitaxel with partial response. Oncology plans to initiate therapy with carboplatin and gemcitabine on 08/08/2015. Outpatient follow-up with oncology.  #3 COPD Stable. Will place on Spiriva and Symbicort. Tobacco cessation.  #4 prophylaxis Lovenox for DVT prophylaxis.    Code Status: Full Family Communication: Updated patient and family at bedside.  Disposition Plan: Per CVTS   Consultants:  CVTS: Dr Servando Snare 07/28/2015  Procedures:  Video bronchoscopy with attempted insertion of intrabronchial valve with fluoroscopy insertion of second right chest tube with fluoroscopy 08/02/2015 per Dr. Servando Snare   chest x-ray 08/02/2015, 08/03/2015     Antibiotics:  None   HPI/Subjective: Patient denies any testing. No shortness of breath.  Objective: Filed Vitals:   08/03/15 0522  BP: 132/72  Pulse: 74  Temp: 97.7 F (36.5 C)  Resp: 18    Intake/Output Summary (Last 24 hours) at 08/03/15 1403 Last data filed at 08/03/15 1236  Gross per 24 hour  Intake    600 ml  Output   1170 ml  Net   -570 ml   Filed Weights   07/28/15 0652 07/28/15 1039  Weight: 61.236 kg (135 lb) 60.1 kg (132 lb 7.9 oz)    Exam:   General:  NAD  Cardiovascular: RRR  Respiratory: CTAB  Abdomen: Soft, nontender,  nondistended, positive bowel sounds.   Musculoskeletal:  no clubbing cyanosis or edema.   Data Reviewed: Basic Metabolic Panel:  Recent Labs Lab 07/28/15 0720 07/29/15 0510 07/30/15 0509 08/01/15 2044  NA 136 137 138 138  K 5.3* 4.2 4.2 3.7  CL 105 103 104 104  CO2 '23 26 28 27  '$ GLUCOSE 107* 95 89 93  BUN '20 14 19 14  '$ CREATININE 0.90 0.87 0.88 0.90  CALCIUM 9.3 8.9 9.0 9.1   Liver Function Tests:  Recent Labs Lab 08/01/15 2044  AST 21  ALT 19  ALKPHOS 79  BILITOT 0.3  PROT 6.1*  ALBUMIN 3.3*   No results for input(s): LIPASE, AMYLASE in the last 168 hours. No results for input(s): AMMONIA in the last 168 hours. CBC:  Recent Labs Lab 07/28/15 0720 07/29/15 0510 07/30/15 0509 08/01/15 2044  WBC 7.8 7.0 9.0 7.1  NEUTROABS 5.8  --   --   --   HGB 14.6 13.2 13.4 13.1  HCT 41.1 40.0 38.4* 39.7  MCV 91.5 92.8 92.3 92.1  PLT 244 224 190 180   Cardiac Enzymes: No results for input(s): CKTOTAL, CKMB, CKMBINDEX, TROPONINI in the last 168 hours. BNP (last 3 results)  Recent Labs  04/04/15 1831 07/28/15 0720  BNP 53.8 42.7    ProBNP (last 3 results) No results for input(s): PROBNP in the last 8760 hours.  CBG: No results for input(s): GLUCAP in the last 168 hours.  Recent Results (from the past 240 hour(s))  Surgical pcr screen     Status: None   Collection Time: 08/01/15 11:47 PM  Result Value Ref Range Status   MRSA,  PCR NEGATIVE NEGATIVE Final   Staphylococcus aureus NEGATIVE NEGATIVE Final    Comment:        The Xpert SA Assay (FDA approved for NASAL specimens in patients over 73 years of age), is one component of a comprehensive surveillance program.  Test performance has been validated by [facility] for patients greater than or equal to 70 year old. It is not intended to diagnose infection nor to guide or monitor treatment.   Culture, respiratory (NON-Expectorated)     Status: None (Preliminary result)   Collection Time: 08/02/15  8:26  AM  Result Value Ref Range Status   Specimen Description BRONCHIAL WASHINGS RIGHT  Final   Special Requests NONE  Final   Gram Stain   Final    MODERATE WBC PRESENT, PREDOMINANTLY PMN RARE SQUAMOUS EPITHELIAL CELLS PRESENT FEW GRAM POSITIVE COCCI IN PAIRS FEW GRAM NEGATIVE COCCOBACILLI Performed at [facility]    Culture   Final    Culture reincubated for better growth Performed at [facility]    Report Status PENDING  Incomplete  Gram stain     Status: None   Collection Time: 08/02/15  8:26 AM  Result Value Ref Range Status   Specimen Description BRONCHIAL WASHINGS  Final   Special Requests NONE  Final   Gram Stain   Final    MODERATE WBC PRESENT, PREDOMINANTLY PMN FEW GRAM POSITIVE COCCI IN PAIRS FEW GRAM NEGATIVE COCCOBACILLI RARE GRAM POSITIVE RODS    Report Status 08/02/2015 FINAL  Final     Studies: Dg Chest Port 1 View  08/03/2015   CLINICAL DATA:  74 year old male, chest tube placement. Stage IIIA right lung adenocarcinoma. Initial encounter.  EXAM: PORTABLE CHEST 1 VIEW  COMPARISON:  08/02/2015 and earlier.  FINDINGS: Portable AP upright view at 0712 hours. Two right side chest tubes are stable. Patchy opacity at the right lung base along the course of the tubes is stable. Stable right pneumothorax, small to moderate. Pleural edge is also visible at the right lung base today.  Slightly lower lung volumes. Stable cardiac size and mediastinal contours. Stable left lung.  IMPRESSION: Mildly lower lung volumes today. Small to moderate right pneumothorax is stable with 2 right chest tubes remaining in place.   Electronically Signed   By: Gabriel [name] M.D.   On: 08/03/2015 08:21   Dg Chest Port 1 View  08/02/2015   CLINICAL DATA:  Status post intrabronchial valve placement  EXAM: CHEST RADIOGRAPH-ONE VIEW  COMPARISON:  August 01, 2015  FINDINGS: There are 2 chest tubes on the right. Pneumothorax on the right is slightly larger compared to 1 day prior. Is patchy  consolidation in the right base. There is generalized interstitial prominence, likely reflecting chronic inflammatory type change. Heart size and pulmonary vascularity are normal. No adenopathy.  IMPRESSION: Pneumothorax on the right is slightly larger compared to 1 day prior. Two chest tubes are currently present on the right there is no tension component. There is persistent patchy consolidation right base. Left lung clear except for underlying interstitial prominence which is stable and probably reflective of chronic inflammatory type change. No change in cardiac silhouette.   Electronically Signed   By: [name]   On: 08/02/2015 10:15   Dg Fluoro Guide Cv Line-no Report  08/02/2015   CLINICAL DATA:    FLOURO GUIDE CV LINE  Fluoroscopy was utilized by the requesting physician.  No radiographic  interpretation.     Scheduled Meds: . enoxaparin (  LOVENOX) injection  40 mg Subcutaneous Q24H  . sodium chloride  3 mL Intravenous Q12H  . sodium chloride  3 mL Intravenous Q12H   Continuous Infusions:   Principal Problem:   Pneumothorax Active Problems:   Non-small cell carcinoma of right lung, stage 3 (HCC)   COPD (chronic obstructive pulmonary disease) (HCC)   Lung cancer (HCC)    Time spent: 8 minutes    THOMPSON,DANIEL M.D. [facility] Pager [phone]. If 7PM-7AM, please contact night-coverage at [email or web address], password [facility] 08/03/2015, 2:03 PM  LOS: 6 days

## 2015-08-03 NOTE — Evaluation (Addendum)
Occupational Therapy Evaluation Patient Details Name: Gabriel [name] MRN: [id number] DOB: [birth year] Today's Date: 08/03/2015    History of Present Illness 74 y.o. 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 He was last seen by Dr. Julien Nordmann on 07/25/2015 with treatment options were discussed at the time, he expects to start treatment with carboplatin and gemcitabine on 08/08/2015. Mr. Jacquin also has a history of a right-sided pneumothorax diagnosed on 04/04/2015 undergoing chest tube placement. He presented to the emergency room with complaints of severe dyspnea on exertion. Pt now s/p BRONCHOSCOPY WITH ATTEMPTED INSERTION OF INTERBRONCHIAL VALVE (IBV) WITH FLUROSCOPY and INSERTION OF SECOND RIGHT CHEST TUBE WITH FLUROSCOPY (Right)   Clinical Impression   Pt s/p above. Pt independent with ADLs, PTA. Feel pt will benefit from acute OT to increase activity tolerance prior to d/c. Do not feel pt will need follow up OT upon d/c.     Follow Up Recommendations  No OT follow up;Supervision - Intermittent    Equipment Recommendations  None recommended by OT    Recommendations for Other Services       Precautions / Restrictions Precautions Precaution Comments: watch O2 sats; has 2 chest tubes Restrictions Weight Bearing Restrictions: No      Mobility Bed Mobility               General bed mobility comments: not assessed  Transfers Overall transfer level: Needs assistance   Transfers: Sit to/from Stand Sit to Stand: Supervision              Balance  No LOB in session.                                           ADL Overall ADL's : Needs assistance/impaired     Grooming: Wash/dry hands;Supervision/safety;Standing   Upper Body Bathing: Supervision/  safety;Standing   Lower Body Bathing: Supervison/ safety;Sit to/from stand   Upper Body Dressing : Supervision/safety;Standing   Lower Body Dressing: Supervision/safety;Sit to/from stand   Toilet Transfer: Supervision/safety;Ambulation (sit to stand from bed)           Functional mobility during ADLs: Supervision/safety General ADL Comments: Educated on energy conservation. Mentioned getting a shower chair, but pt not interested-also suggested maybe sitting on edge of tub. Suggested long handled sponge for washing back. Explained role of OT and benefit of therapy. Pt seemed SOB in session-he reported it was due to pain.     Vision     Perception     Praxis      Pertinent Vitals/Pain Pain Assessment: 0-10 Pain Score: 2  Pain Location: right side Pain Descriptors / Indicators: Spasm;Grimacing Pain Intervention(s): Monitored during session  Pt on RA during session and O2 sats towards end of session were in 80s. O2 did trend up to 90-91%. Placed pt on 2L of O2. Notified nurse.     Hand Dominance     Extremity/Trunk Assessment Upper Extremity Assessment Upper Extremity Assessment: Overall WFL for tasks assessed   Lower Extremity Assessment Lower Extremity Assessment: Overall WFL for tasks assessed       Communication Communication Communication: No difficulties   Cognition Arousal/Alertness: Awake/alert Behavior During Therapy: WFL for tasks assessed/performed Overall Cognitive Status: Within Functional Limits for tasks assessed  General Comments       Exercises       Shoulder Instructions      Home Living Family/patient expects to be discharged to:: Private residence Living Arrangements: Alone Available Help at Discharge: Family Type of Home: Other(Comment) (townhome) Home Access: Stairs to enter Technical brewer of Steps: 3 Entrance Stairs-Rails: None Home Layout: One level     Bathroom Shower/Tub: Tub/shower unit                     Prior Functioning/Environment Level of Independence: Independent             OT Diagnosis: Other (comment) (decreased activity tolerance)   OT Problem List: Decreased activity tolerance;Pain;Decreased knowledge of precautions;Decreased knowledge of use of DME or AE   OT Treatment/Interventions: Self-care/ADL training;Therapeutic exercise;Energy conservation;DME and/or AE instruction;Therapeutic activities;Patient/family education;Balance training    OT Goals(Current goals can be found in the care plan section) Acute Rehab OT Goals Patient Stated Goal: not stated OT Goal Formulation: With patient Time For Goal Achievement: 08/10/15 Potential to Achieve Goals: Good ADL Goals Pt Will Perform Lower Body Bathing: sit to/from stand;with modified independence Pt Will Perform Lower Body Dressing: sit to/from stand;Independently Pt Will Transfer to Toilet: with modified independence;ambulating;regular height toilet Additional ADL Goal #1: Pt will independently utilize energy conservation techniques as needed.  OT Frequency: Min 2X/week   Barriers to D/C:            Co-evaluation              End of Session Nurse Communication: Other (comment) (O2 sats)  Activity Tolerance: Patient tolerated treatment well Patient left: with family/visitor present (sitting EOB)   Time: 1437-1510 OT Time Calculation (min): 33 min Charges:  OT General Charges $OT Visit: 1 Procedure OT Evaluation $Initial OT Evaluation Tier I: 1 Procedure OT Treatments $Self Care/Home Management : 8-22 mins G-CodesBenito Mccreedy OTR/L [phone] 08/03/2015, 3:29 PM

## 2015-08-04 ENCOUNTER — Inpatient Hospital Stay (HOSPITAL_COMMUNITY): Payer: 59

## 2015-08-04 DIAGNOSIS — J189 Pneumonia, unspecified organism: Secondary | ICD-10-CM | POA: Clinically undetermined

## 2015-08-04 DIAGNOSIS — J441 Chronic obstructive pulmonary disease with (acute) exacerbation: Secondary | ICD-10-CM | POA: Diagnosis not present

## 2015-08-04 LAB — CBC WITH DIFFERENTIAL/PLATELET
Basophils Absolute: 0 10*3/uL (ref 0.0–0.1)
Basophils Relative: 0 %
EOS PCT: 1 %
Eosinophils Absolute: 0.2 10*3/uL (ref 0.0–0.7)
HCT: 41.4 % (ref 39.0–52.0)
HEMOGLOBIN: 13.8 g/dL (ref 13.0–17.0)
LYMPHS ABS: 0.7 10*3/uL (ref 0.7–4.0)
LYMPHS PCT: 5 %
MCH: 30.7 pg (ref 26.0–34.0)
MCHC: 33.3 g/dL (ref 30.0–36.0)
MCV: 92 fL (ref 78.0–100.0)
Monocytes Absolute: 1.5 10*3/uL — ABNORMAL HIGH (ref 0.1–1.0)
Monocytes Relative: 10 %
NEUTROS PCT: 84 %
Neutro Abs: 11.6 10*3/uL — ABNORMAL HIGH (ref 1.7–7.7)
Platelets: 187 10*3/uL (ref 150–400)
RBC: 4.5 MIL/uL (ref 4.22–5.81)
RDW: 16.2 % — ABNORMAL HIGH (ref 11.5–15.5)
WBC: 14.1 10*3/uL — AB (ref 4.0–10.5)

## 2015-08-04 LAB — BASIC METABOLIC PANEL
Anion gap: 8 (ref 5–15)
BUN: 14 mg/dL (ref 6–20)
CHLORIDE: 100 mmol/L — AB (ref 101–111)
CO2: 27 mmol/L (ref 22–32)
Calcium: 9.1 mg/dL (ref 8.9–10.3)
Creatinine, Ser: 0.89 mg/dL (ref 0.61–1.24)
GFR calc Af Amer: >60 mL/min (ref >60)
GFR calc non Af Amer: >60 mL/min (ref >60)
Glucose, Bld: 119 mg/dL — ABNORMAL HIGH (ref 65–99)
POTASSIUM: 4 mmol/L (ref 3.5–5.1)
SODIUM: 135 mmol/L (ref 135–145)

## 2015-08-04 MED ORDER — IPRATROPIUM-ALBUTEROL 0.5-2.5 (3) MG/3ML IN SOLN: 3.0000 mL | RESPIRATORY_TRACT | Status: DC | PRN

## 2015-08-04 MED ORDER — METHYLPREDNISOLONE SODIUM SUCC 125 MG IJ SOLR
80.0000 mg | Freq: Three times a day (TID) | INTRAMUSCULAR | Status: DC
  Administered 2015-08-04 – 2015-08-06 (×6): 80 mg via INTRAVENOUS
  Filled 2015-08-04 (×6): qty 2

## 2015-08-04 MED ORDER — ALPRAZOLAM 0.5 MG PO TABS
0.5000 mg | ORAL_TABLET | Freq: Three times a day (TID) | ORAL | Status: DC | PRN
  Administered 2015-08-04 – 2015-08-18 (×25): 0.5 mg via ORAL
  Filled 2015-08-04 (×28): qty 1

## 2015-08-04 MED ORDER — IPRATROPIUM BROMIDE 0.02 % IN SOLN: 0.5000 mg | Freq: Four times a day (QID) | RESPIRATORY_TRACT | Status: DC

## 2015-08-04 MED ORDER — GUAIFENESIN ER 600 MG PO TB12
1200.0000 mg | ORAL_TABLET | Freq: Two times a day (BID) | ORAL | Status: DC
  Administered 2015-08-04 – 2015-08-18 (×29): 1200 mg via ORAL
  Filled 2015-08-04 (×32): qty 2

## 2015-08-04 MED ORDER — LEVALBUTEROL HCL 0.63 MG/3ML IN NEBU
0.6300 mg | INHALATION_SOLUTION | Freq: Four times a day (QID) | RESPIRATORY_TRACT | Status: DC
  Administered 2015-08-04 – 2015-08-07 (×12): 0.63 mg via RESPIRATORY_TRACT
  Filled 2015-08-04 (×12): qty 3

## 2015-08-04 MED ORDER — LEVOFLOXACIN IN D5W 750 MG/150ML IV SOLN
750.0000 mg | INTRAVENOUS | Status: DC
  Administered 2015-08-04 – 2015-08-07 (×4): 750 mg via INTRAVENOUS
  Filled 2015-08-04 (×4): qty 150

## 2015-08-04 MED ORDER — LEVALBUTEROL HCL 0.63 MG/3ML IN NEBU: 0.6300 mg | INHALATION_SOLUTION | Freq: Four times a day (QID) | RESPIRATORY_TRACT | Status: DC

## 2015-08-04 MED ORDER — IPRATROPIUM BROMIDE 0.02 % IN SOLN
0.5000 mg | RESPIRATORY_TRACT | Status: DC | PRN
  Administered 2015-08-09 – 2015-08-15 (×3): 0.5 mg via RESPIRATORY_TRACT
  Filled 2015-08-04 (×3): qty 2.5

## 2015-08-04 MED ORDER — LEVALBUTEROL HCL 0.63 MG/3ML IN NEBU
0.6300 mg | INHALATION_SOLUTION | RESPIRATORY_TRACT | Status: DC | PRN
  Administered 2015-08-04: 0.63 mg via RESPIRATORY_TRACT
  Filled 2015-08-04: qty 3

## 2015-08-04 MED ORDER — LEVALBUTEROL HCL 0.63 MG/3ML IN NEBU: 0.6300 mg | INHALATION_SOLUTION | Freq: Four times a day (QID) | RESPIRATORY_TRACT | Status: DC | PRN

## 2015-08-04 MED ORDER — IPRATROPIUM BROMIDE 0.02 % IN SOLN
0.5000 mg | Freq: Four times a day (QID) | RESPIRATORY_TRACT | Status: DC
  Administered 2015-08-04 – 2015-08-07 (×12): 0.5 mg via RESPIRATORY_TRACT
  Filled 2015-08-04 (×12): qty 2.5

## 2015-08-04 NOTE — Progress Notes (Signed)
Utilization review completed.  

## 2015-08-04 NOTE — Progress Notes (Signed)
Called by RN for patient with c/o chest tightness and trouble taking a breath.  Upon my arrival to patients room, RN and RT at bedside along with family members.  VSS.  Breath Sounds coarse rhonchi on right, left diminished at base.  Patient states he feels like his lungs are full and feels a little anxious.  Rn giving patient morphine, RT giving a breathing treatment.  RN to call if assistance needed

## 2015-08-04 NOTE — Progress Notes (Signed)
[facility] PROGRESS NOTE  Gabriel [name] YHC:[id number] DOB: August 18, 1941 DOA: 07/28/2015 PCP: No PCP Per Patient  Assessment/Plan: #1 recurrent pneumothorax Questionable etiology. May be secondary to history of lung cancer, COPD and ongoing tobacco abuse. Patient status post video bronchoscopy with attempted insertion of intrabronchial valve with fluoroscopy, insertion of second right chest tube 08/02/2015. Chest x-ray this morning with worsening pneumothorax. Chest tube was placed back to suction per CT surgery with repeat chest x-ray with improvement in pneumothorax. Pain management. Per cardiothoracic surgery.  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 Earlie Server of oncology. Patient status post treatment with chemotherapy and radiation therapy, carboplatin and paclitaxel with partial response. Oncology plans to initiate therapy with carboplatin and gemcitabine on 08/08/2015. Outpatient follow-up with oncology.  #3 COPD with acute exacerbation Patient with wheezing noted on examination and right-sided rhonchi complaining of shortness of breath. Chest x-ray repeat shows improved pneumothorax after chest tubes have been placed back on suction with persistent right basilar opacity consistent with atelectasis or infiltrate. Patient also noted to have a leukocytosis. Discontinue Spiriva. Place on Xopenex and Atrovent scheduled nebulizers, continue Symbicort, place on IV Solu-Medrol, place on IV Levaquin.   #4 probable HCAP Repeat chest x-ray with persistent right basilar opacity noted consistent with atelectasis or infiltrate. Patient withdrawn process breath sounds and patient states feeling congested. Patient also noted to be wheezing. Check a urine Legionella antigen. Check a urine pneumococcus antigen. Check a sputum Gram stain and culture. Patient with history of lung cancer and as such is immunocompromised with a pneumothorax and will place empirically on IV Levaquin,  scheduled nebs, Mucinex, flutter valve. Follow.  #5 leukocytosis Likely secondary to problem #4. Check a sputum Gram stain and culture. Check a urine Legionella antigen. Check a urine pneumococcus antigen.   #6 prophylaxis Lovenox for DVT prophylaxis.    Code Status: Full Family Communication: Updated patient and family at bedside.  Disposition Plan: Per CVTS   Consultants:  CVTS: Dr Servando Snare 07/28/2015  Procedures:  Video bronchoscopy with attempted insertion of intrabronchial valve with fluoroscopy insertion of second right chest tube with fluoroscopy 08/02/2015 per Dr. Servando Snare   chest x-ray 08/02/2015, 08/03/2015, 08/04/2015     Antibiotics:  IV Levaquin 08/04/2015  HPI/Subjective: Patient states was very short of breath and very anxious and concerned about his breathing. Patient also with some complaints of congestion.  Objective: Filed Vitals:   08/04/15 1400  BP: 117/64  Pulse:   Temp:   Resp:     Intake/Output Summary (Last 24 hours) at 08/04/15 1522 Last data filed at 08/04/15 0558  Gross per 24 hour  Intake    240 ml  Output    390 ml  Net   -150 ml   Filed Weights   07/28/15 0652 07/28/15 1039  Weight: 61.236 kg (135 lb) 60.1 kg (132 lb 7.9 oz)    Exam:   General:  NAD  Cardiovascular: RRR  Respiratory: Rhonchorous breath sounds on the right greater than left. Inspiratory and expiratory wheezing  Abdomen: Soft, nontender, nondistended, positive bowel sounds.   Musculoskeletal:  no clubbing cyanosis or edema.   Data Reviewed: Basic Metabolic Panel:  Recent Labs Lab 07/29/15 0510 07/30/15 0509 08/01/15 2044 08/04/15 0519  NA 137 138 138 135  K 4.2 4.2 3.7 4.0  CL 103 104 104 100*  CO2 '26 28 27 27  '$ GLUCOSE 95 89 93 119*  BUN '14 19 14 14  '$ CREATININE 0.87 0.88 0.90 0.89  CALCIUM  8.9 9.0 9.1 9.1   Liver Function Tests:  Recent Labs Lab 08/01/15 2044  AST 21  ALT 19  ALKPHOS 79  BILITOT 0.3  PROT 6.1*  ALBUMIN 3.3*    No results for input(s): LIPASE, AMYLASE in the last 168 hours. No results for input(s): AMMONIA in the last 168 hours. CBC:  Recent Labs Lab 07/29/15 0510 07/30/15 0509 08/01/15 2044 08/04/15 1316  WBC 7.0 9.0 7.1 14.1*  NEUTROABS  --   --   --  11.6*  HGB 13.2 13.4 13.1 13.8  HCT 40.0 38.4* 39.7 41.4  MCV 92.8 92.3 92.1 92.0  PLT 224 190 180 187   Cardiac Enzymes: No results for input(s): CKTOTAL, CKMB, CKMBINDEX, TROPONINI in the last 168 hours. BNP (last 3 results)  Recent Labs  04/04/15 1831 07/28/15 0720  BNP 53.8 42.7    ProBNP (last 3 results) No results for input(s): PROBNP in the last 8760 hours.  CBG: No results for input(s): GLUCAP in the last 168 hours.  Recent Results (from the past 240 hour(s))  Surgical pcr screen     Status: None   Collection Time: 08/01/15 11:47 PM  Result Value Ref Range Status   MRSA, PCR NEGATIVE NEGATIVE Final   Staphylococcus aureus NEGATIVE NEGATIVE Final    Comment:        The Xpert SA Assay (FDA approved for NASAL specimens in patients over 31 years of age), is one component of a comprehensive surveillance program.  Test performance has been validated by [facility] for patients greater than or equal to 61 year old. It is not intended to diagnose infection nor to guide or monitor treatment.   Culture, respiratory (NON-Expectorated)     Status: None (Preliminary result)   Collection Time: 08/02/15  8:26 AM  Result Value Ref Range Status   Specimen Description BRONCHIAL WASHINGS RIGHT  Final   Special Requests NONE  Final   Gram Stain   Final    MODERATE WBC PRESENT, PREDOMINANTLY PMN RARE SQUAMOUS EPITHELIAL CELLS PRESENT FEW GRAM POSITIVE COCCI IN PAIRS FEW GRAM NEGATIVE COCCOBACILLI Performed at [facility]    Culture   Final    Non-Pathogenic Oropharyngeal-type Flora Isolated. Performed at [facility]    Report Status PENDING  Incomplete  Gram stain     Status: None   Collection  Time: 08/02/15  8:26 AM  Result Value Ref Range Status   Specimen Description BRONCHIAL WASHINGS  Final   Special Requests NONE  Final   Gram Stain   Final    MODERATE WBC PRESENT, PREDOMINANTLY PMN FEW GRAM POSITIVE COCCI IN PAIRS FEW GRAM NEGATIVE COCCOBACILLI RARE GRAM POSITIVE RODS    Report Status 08/02/2015 FINAL  Final     Studies: Dg Chest Port 1 View  08/04/2015   CLINICAL DATA:  Shortness of breath.  EXAM: PORTABLE CHEST 1 VIEW  COMPARISON:  Same day.  FINDINGS: Two right-sided chest tubes are again noted and unchanged in position. Right-sided pneumothorax noted on prior exam is significantly smaller, with mild apical component remaining. Stable right basilar opacity is noted consistent with atelectasis or infiltrate and possible associated pleural effusion. Left lung is clear and hyperexpanded. Mediastinal shift to the right is noted. Old left rib fractures are noted.  IMPRESSION: Mild right apical pneumothorax is noted which is significantly smaller since prior exam. 2 right-sided chest tubes are unchanged in position. Persistent right basilar opacity is noted consistent with atelectasis or infiltrate and possible associated effusion.  Electronically Signed   By: Marijo Conception, M.D.   On: 08/04/2015 11:45   Dg Chest Port 1 View  08/04/2015   CLINICAL DATA:  Pneumothorax.  EXAM: PORTABLE CHEST 1 VIEW  COMPARISON:  08/03/2015.  CT 07/22/2015 .  FINDINGS: Two right chest tubes in stable position. Progression of right pneumothorax. Atelectasis and consolidation of the right lower lobe. Persistent right infrahilar mass. Left lung is clear. Heart size normal.  IMPRESSION: 1. Two right chest tubes in stable position. Significant progression of right-sided pneumothorax. Pneumothorax is approximately 40%. 2. Dense atelectasis and consolidation of the right lower lobe. Persistent right infrahilar mass. Critical Value/emergent results were called by telephone at the time of interpretation on  08/04/2015 at 7:46 am to nurse Lee,who verbally acknowledged these results.   Electronically Signed   By: Marcello Moores  Register   On: 08/04/2015 07:47   Dg Chest Port 1 View  08/03/2015   CLINICAL DATA:  74 year old male, chest tube placement. Stage IIIA right lung adenocarcinoma. Initial encounter.  EXAM: PORTABLE CHEST 1 VIEW  COMPARISON:  08/02/2015 and earlier.  FINDINGS: Portable AP upright view at 0712 hours. Two right side chest tubes are stable. Patchy opacity at the right lung base along the course of the tubes is stable. Stable right pneumothorax, small to moderate. Pleural edge is also visible at the right lung base today.  Slightly lower lung volumes. Stable cardiac size and mediastinal contours. Stable left lung.  IMPRESSION: Mildly lower lung volumes today. Small to moderate right pneumothorax is stable with 2 right chest tubes remaining in place.   Electronically Signed   By: [name]   On: 08/03/2015 08:21    Scheduled Meds: . budesonide-formoterol  2 puff Inhalation BID  . enoxaparin (LOVENOX) injection  40 mg Subcutaneous Q24H  . guaiFENesin  1,200 mg Oral BID  . ipratropium  0.5 mg Nebulization Q6H  . levalbuterol  0.63 mg Nebulization Q6H  . levofloxacin (LEVAQUIN) IV  750 mg Intravenous Q24H  . methylPREDNISolone (SOLU-MEDROL) injection  80 mg Intravenous 3 times per day  . sodium chloride  3 mL Intravenous Q12H  . sodium chloride  3 mL Intravenous Q12H   Continuous Infusions:   Principal Problem:   Pneumothorax Active Problems:   Pneumothorax on right   Non-small cell carcinoma of right lung, stage 3 (HCC)   COPD (chronic obstructive pulmonary disease) (HCC)   Lung cancer (HCC)   HCAP (healthcare-associated pneumonia)   COPD exacerbation (HCC)    Time spent: 15 minutes    THOMPSON,DANIEL M.D. [facility] Pager [phone]. If 7PM-7AM, please contact night-coverage at [email or web address], password [facility] 08/04/2015, 3:22 PM  LOS: 7 days

## 2015-08-04 NOTE — Op Note (Signed)
NAMEDEYTON, ELLENBECKER NO.:  [id number]  MEDICAL RECORD NO.:  [id number]  LOCATION:  [id number]                        FACILITY:  [facility]  PHYSICIAN:  Lanelle Bal, MD    DATE OF BIRTH:  18-Apr-1941  DATE OF PROCEDURE:  08/02/2015 DATE OF DISCHARGE:                              OPERATIVE REPORT   PREOPERATIVE DIAGNOSIS:  Persistent air leak following spontaneous pneumothorax.  POSTOPERATIVE DIAGNOSIS:  Persistent air leak following spontaneous pneumothorax.  SURGICAL PROCEDURE:  Bronchoscopy with possible placement of intrabronchial valves and placement of second right chest tube.  SURGEON:  Lanelle Bal, M.D.  BRIEF HISTORY:  The patient is a 74 year old male, with known stage IIIB carcinoma of the lung, originally diagnosed when he presented with a right spontaneous pneumothorax.  At that time, a CT scan revealed a moderate-size right hilar mass.  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  Approximately 5 days previously, he presented to the [facility] Emergency Room with a recurrent right spontaneous pneumothorax.  A right chest tube was placed with reinflation of his lung.  He continued to have air leak and residual basilar pneumothorax.  We discussed with the patient options of treatment.  His underlying pulmonary disease and malignancy would make him a poor candidate for video-assisted thoracoscopy.  We discussed placement of IB valves as a possible treatment.  He agreed and signed informed consent.  DESCRIPTION OF PROCEDURE:  The patient underwent general endotracheal anesthesia without incident.  After appropriate time-out, a video bronchoscope was used to examine the tracheobronchial tree.  There were little in the way of secretions.  The left tracheobronchial tree was without lesions.  The right tracheobronchial tree was then  carefully examined.  The right upper lobe was free of any disease.  There was extrinsic compression and narrowing of the bronchus intermedius, the middle lobe bronchus and the lower lobe bronchus.  We, in a systematic manner, used a Fogarty balloon to occlude upper lobe, middle lobe, and lower lobe in an attempt to isolate an area of air leak for placement of the balloons.  Doing this, we were unable to find a satisfactory anatomic situation where the balloon would stop the air leak for placement of valve, so we abandoned placement of IB valves.  While the patient was asleep, the right chest was then prepped in an attempt to get the lung to fully inflate.  A second chest tube was placed.  The patient tolerated the procedure without obvious complication and was transferred to the recovery room for postoperative care.  Sponge and needle counts were correct.     Lanelle Bal, MD     EG/MEDQ  D:  08/04/2015  T:  08/04/2015  Job:  [id number]

## 2015-08-04 NOTE — Progress Notes (Signed)
Called by Gabriel [name]'s nurse regarding patient being anxious and experiencing shortness of breath.  Patient states his occurred around 1pm.  He states it felt like his lungs were full of fluid.  Currently he states he is feeling better and his shortness of breath is back to baseline.    BP 117/64 mmHg  Pulse 90  Temp(Src) 98 F (36.7 C) (Oral)  Resp 18  Ht '5\' 10"'$  (1.778 m)  Wt 132 lb 7.9 oz (60.1 kg)  BMI 19.01 kg/m2  SpO2 95%  Gen: no apparent distress, awoke from sleep for eval Pulm- diminished R base Chest Tubes- Posterior chest tube is free from air leak, anterior chest tube with air leak  A/P:  1. Patients discomfort and symptoms were likely from lung trying to re-expand from Pleuro-vac being placed back on suction 2. Will add low dose Xanax prn for anxiety 3. Add Xopenex nebs prn 4. Instructed to nurse to notify primary team in case further issues arise, they should be aware

## 2015-08-04 NOTE — Progress Notes (Signed)
PT Cancellation Note  Patient Details Name: Gabriel [name] MRN: [id number] DOB: [birth year]   Cancelled Treatment:    Reason Eval/Treat Not Completed: Medical issues which prohibited therapy. Noted incr pneumothorax. Spoke with patient re: OOB to chair for lunch. He was sitting EOB and states he feels the worst he's felt and just cannot do it today. Agreed PT may return 10/7 to attempt evaluation   [name] 08/04/2015, 11:47 AM  Pager [phone]

## 2015-08-04 NOTE — Progress Notes (Addendum)
Patient continues to c/o of chest tightness. Rapid response called vitals stable patient sats 95% on 2.5 L. Bp 117/64. Heart rate 90. Patient given morphine and changed positions. Called PA to notify of patients condition. Respiratory called to assess patient as well.. Md notified as well. No new orders will continue to monitor.   Tobin Witucki, Mervin Kung Rn

## 2015-08-04 NOTE — Progress Notes (Addendum)
[address]       [place] [address]             [phone]      2 Days Post-Op Procedure(s) (LRB): VIDEO BRONCHOSCOPY WITH ATTEMPTED INSERTION OF INTERBRONCHIAL VALVE (IBV) WITH FLUROSCOPY (N/A) INSERTION OF SECOND RIGHT CHEST TUBE WITH FLUROSCOPY (Right) Subjective: Feels mildly more SOB, and anxious. CXR shows increase in PNTX  Objective: Vital signs in last 24 hours: Temp:  [97.7 F (36.5 C)-99 F (37.2 C)] 98 F (36.7 C) (10/06 0515) Pulse Rate:  [90-95] 90 (10/06 0515) Cardiac Rhythm:  [-] Normal sinus rhythm (10/06 0700) Resp:  [18-19] 18 (10/06 0515) BP: (108-136)/(63-76) 108/63 mmHg (10/06 0515) SpO2:  [93 %-97 %] 93 % (10/06 0515)  Hemodynamic parameters for last 24 hours:    Intake/Output from previous day: 10/05 0701 - 10/06 0700 In: 720 [P.O.:720] Out: 1210 [Urine:1000; Chest Tube:210] Intake/Output this shift:    General appearance: alert, cooperative and no distress Heart: regular rate and rhythm Lungs: dim in right lower fields  Lab Results:  Recent Labs  08/01/15 2044  WBC 7.1  HGB 13.1  HCT 39.7  PLT 180   BMET:  Recent Labs  08/01/15 2044 08/04/15 0519  NA 138 135  K 3.7 4.0  CL 104 100*  CO2 27 27  GLUCOSE 93 119*  BUN 14 14  CREATININE 0.90 0.89  CALCIUM 9.1 9.1    PT/INR:  Recent Labs  08/01/15 2044  LABPROT 14.4  INR 1.10   ABG    Component Value Date/Time   TCO2 22 04/04/2015 1839   CBG (last 3)  No results for input(s): GLUCAP in the last 72 hours.  Meds Scheduled Meds: . budesonide-formoterol  2 puff Inhalation BID  . enoxaparin (LOVENOX) injection  40 mg Subcutaneous Q24H  . sodium chloride  3 mL Intravenous Q12H  . sodium chloride  3 mL Intravenous Q12H  . tiotropium  18 mcg Inhalation Daily   Continuous Infusions:  PRN Meds:.sodium chloride, acetaminophen **OR** acetaminophen, alum & mag hydroxide-simeth, diazepam, docusate sodium, HYDROmorphone (DILAUDID) injection, morphine  injection, ondansetron **OR** ondansetron (ZOFRAN) IV, oxyCODONE, sodium chloride  Xrays Dg Chest Port 1 View  08/04/2015   CLINICAL DATA:  Pneumothorax.  EXAM: PORTABLE CHEST 1 VIEW  COMPARISON:  08/03/2015.  CT 07/22/2015 .  FINDINGS: Two right chest tubes in stable position. Progression of right pneumothorax. Atelectasis and consolidation of the right lower lobe. Persistent right infrahilar mass. Left lung is clear. Heart size normal.  IMPRESSION: 1. Two right chest tubes in stable position. Significant progression of right-sided pneumothorax. Pneumothorax is approximately 40%. 2. Dense atelectasis and consolidation of the right lower lobe. Persistent right infrahilar mass. Critical Value/emergent results were called by telephone at the time of interpretation on 08/04/2015 at 7:46 am to nurse Lee,who verbally acknowledged these results.   Electronically Signed   By: Marcello Moores  Register   On: 08/04/2015 07:47   Dg Chest Port 1 View  08/03/2015   CLINICAL DATA:  74 year old male, chest tube placement. Stage IIIA right lung adenocarcinoma. Initial encounter.  EXAM: PORTABLE CHEST 1 VIEW  COMPARISON:  08/02/2015 and earlier.  FINDINGS: Portable AP upright view at 0712 hours. Two right side chest tubes are stable. Patchy opacity at the right lung base along the course of the tubes is stable. Stable right pneumothorax, small to moderate. Pleural edge is also visible at the right lung base today.  Slightly lower lung volumes. Stable cardiac  size and mediastinal contours. Stable left lung.  IMPRESSION: Mildly lower lung volumes today. Small to moderate right pneumothorax is stable with 2 right chest tubes remaining in place.   Electronically Signed   By: [name]   On: 08/03/2015 08:21   Dg Chest Port 1 View  08/02/2015   CLINICAL DATA:  Status post intrabronchial valve placement  EXAM: CHEST RADIOGRAPH-ONE VIEW  COMPARISON:  August 01, 2015  FINDINGS: There are 2 chest tubes on the right. Pneumothorax on the  right is slightly larger compared to 1 day prior. Is patchy consolidation in the right base. There is generalized interstitial prominence, likely reflecting chronic inflammatory type change. Heart size and pulmonary vascularity are normal. No adenopathy.  IMPRESSION: Pneumothorax on the right is slightly larger compared to 1 day prior. Two chest tubes are currently present on the right there is no tension component. There is persistent patchy consolidation right base. Left lung clear except for underlying interstitial prominence which is stable and probably reflective of chronic inflammatory type change. No change in cardiac silhouette.   Electronically Signed   By: [name]   On: 08/02/2015 10:15   Dg Fluoro Guide Cv Line-no Report  08/02/2015   CLINICAL DATA:    FLOURO GUIDE CV LINE  Fluoroscopy was utilized by the requesting physician.  No radiographic  interpretation.     Assessment/Plan: S/P Procedure(s) (LRB): VIDEO BRONCHOSCOPY WITH ATTEMPTED INSERTION OF INTERBRONCHIAL VALVE (IBV) WITH FLUROSCOPY (N/A) INSERTION OF SECOND RIGHT CHEST TUBE WITH FLUROSCOPY (Right)  1 Chest tube shows small air leak but pntx is increased- place back on 20 cm H2O suction . Will probably require talc pleurodesis in near future   LOS: 7 days    [name],[name] 08/04/2015  significant increase in ptx off suction, ct tubes back to suction , consider talc pleurodesis when lung up  I have seen and examined Gabriel [name] and agree with the above assessment  and plan.  [name] Beeper [phone] Office [phone] 08/04/2015 8:39 AM

## 2015-08-04 NOTE — Progress Notes (Signed)
Patient c/o of chest tightness when hooked the chest tubes up to suction. Pa notified and he ordered a chest x-ray.   Patients sats remained above 95% on 2L . Bp and heart rate stable.  Chevelle Coulson, Mervin Kung RN

## 2015-08-04 NOTE — Progress Notes (Signed)
Notified Dr Grandville Silos of the updates today with patients chest tightness and discomfort. New orders given. Will continue to monitor.  Nava Song, Mervin Kung RN

## 2015-08-05 ENCOUNTER — Inpatient Hospital Stay (HOSPITAL_COMMUNITY): Payer: 59

## 2015-08-05 DIAGNOSIS — Z9689 Presence of other specified functional implants: Secondary | ICD-10-CM | POA: Insufficient documentation

## 2015-08-05 LAB — CBC WITH DIFFERENTIAL/PLATELET
BASOS ABS: 0 10*3/uL (ref 0.0–0.1)
BASOS PCT: 0 %
EOS ABS: 0 10*3/uL (ref 0.0–0.7)
EOS PCT: 0 %
HCT: 38.2 % — ABNORMAL LOW (ref 39.0–52.0)
Hemoglobin: 12.8 g/dL — ABNORMAL LOW (ref 13.0–17.0)
LYMPHS PCT: 4 %
Lymphs Abs: 0.4 10*3/uL — ABNORMAL LOW (ref 0.7–4.0)
MCH: 30.9 pg (ref 26.0–34.0)
MCHC: 33.5 g/dL (ref 30.0–36.0)
MCV: 92.3 fL (ref 78.0–100.0)
MONO ABS: 0.2 10*3/uL (ref 0.1–1.0)
Monocytes Relative: 2 %
Neutro Abs: 10 10*3/uL — ABNORMAL HIGH (ref 1.7–7.7)
Neutrophils Relative %: 94 %
PLATELETS: 177 10*3/uL (ref 150–400)
RBC: 4.14 MIL/uL — AB (ref 4.22–5.81)
RDW: 16.1 % — AB (ref 11.5–15.5)
WBC: 10.6 10*3/uL — AB (ref 4.0–10.5)

## 2015-08-05 LAB — CULTURE, RESPIRATORY W GRAM STAIN

## 2015-08-05 LAB — BASIC METABOLIC PANEL
ANION GAP: 9 (ref 5–15)
BUN: 14 mg/dL (ref 6–20)
CALCIUM: 9.3 mg/dL (ref 8.9–10.3)
CO2: 26 mmol/L (ref 22–32)
Chloride: 96 mmol/L — ABNORMAL LOW (ref 101–111)
Creatinine, Ser: 0.81 mg/dL (ref 0.61–1.24)
GFR calc non Af Amer: >60 mL/min (ref >60)
GLUCOSE: 153 mg/dL — AB (ref 65–99)
POTASSIUM: 4.2 mmol/L (ref 3.5–5.1)
SODIUM: 131 mmol/L — AB (ref 135–145)

## 2015-08-05 LAB — HIV ANTIBODY (ROUTINE TESTING W REFLEX): HIV SCREEN 4TH GENERATION: NONREACTIVE

## 2015-08-05 MED ORDER — ACETYLCYSTEINE 20 % IN SOLN
2.0000 mL | Freq: Four times a day (QID) | RESPIRATORY_TRACT | Status: AC
  Administered 2015-08-06: 2 mL via RESPIRATORY_TRACT
  Administered 2015-08-06: 11:00:00 via RESPIRATORY_TRACT
  Filled 2015-08-05 (×4): qty 4

## 2015-08-05 MED ORDER — POLYETHYLENE GLYCOL 3350 17 G PO PACK
17.0000 g | PACK | Freq: Every day | ORAL | Status: DC
  Filled 2015-08-05 (×8): qty 1

## 2015-08-05 NOTE — Progress Notes (Signed)
PT Cancellation Note  Patient Details Name: DUANE EARNSHAW MRN: [id number] DOB: 10/06/1941   Cancelled Treatment:    Reason Eval Not Completed: PT screened, no needs identified, will sign off  On arrival pt standing at bedside with family members present. Discussed prior functional status, he's currently ambulating in room with chest tube on suction (able to verbalize procedure), and states he has no PT needs. Encouraged upright activity as much as possible (OOB in chair, standing, etc).    Joanny Dupree 08/05/2015, 10:03 AM Pager [phone]

## 2015-08-05 NOTE — Progress Notes (Addendum)
[address]       [place] [address]             [phone]      3 Days Post-Op Procedure(s) (LRB): VIDEO BRONCHOSCOPY WITH ATTEMPTED INSERTION OF INTERBRONCHIAL VALVE (IBV) WITH FLUROSCOPY (N/A) INSERTION OF SECOND RIGHT CHEST TUBE WITH FLUROSCOPY (Right) Subjective: Feels better today, breathing is more comfortable and less anxious  Objective: Vital signs in last 24 hours: Temp:  [97.5 F (36.4 C)-98.2 F (36.8 C)] 97.5 F (36.4 C) (10/07 1430) Pulse Rate:  [76-90] 90 (10/07 1430) Cardiac Rhythm:  [-] Normal sinus rhythm (10/07 1000) Resp:  [14-18] 18 (10/07 1430) BP: (105-123)/(62-68) 123/64 mmHg (10/07 1430) SpO2:  [88 %-94 %] 88 % (10/07 1445)  Hemodynamic parameters for last 24 hours:    Intake/Output from previous day: 10/06 0701 - 10/07 0700 In: 480 [P.O.:480] Out: 1200 [Urine:500; Chest Tube:700] Intake/Output this shift: Total I/O In: 360 [P.O.:360] Out: 76 [Urine:1; Chest Tube:75]  General appearance: alert, cooperative and no distress Heart: regular rate and rhythm Lungs: coarse BS  Lab Results:  Recent Labs  08/04/15 1316 08/05/15 0359  WBC 14.1* 10.6*  HGB 13.8 12.8*  HCT 41.4 38.2*  PLT 187 177   BMET:   Recent Labs  08/04/15 0519 08/05/15 0359  NA 135 131*  K 4.0 4.2  CL 100* 96*  CO2 27 26  GLUCOSE 119* 153*  BUN 14 14  CREATININE 0.89 0.81  CALCIUM 9.1 9.3    PT/INR: No results for input(s): LABPROT, INR in the last 72 hours. ABG    Component Value Date/Time   TCO2 22 04/04/2015 1839   CBG (last 3)  No results for input(s): GLUCAP in the last 72 hours.  Meds Scheduled Meds: . budesonide-formoterol  2 puff Inhalation BID  . enoxaparin (LOVENOX) injection  40 mg Subcutaneous Q24H  . guaiFENesin  1,200 mg Oral BID  . ipratropium  0.5 mg Nebulization Q6H  . levalbuterol  0.63 mg Nebulization Q6H  . levofloxacin (LEVAQUIN) IV  750 mg Intravenous Q24H  . methylPREDNISolone (SOLU-MEDROL) injection   80 mg Intravenous 3 times per day  . sodium chloride  3 mL Intravenous Q12H   Continuous Infusions:  PRN Meds:.acetaminophen **OR** acetaminophen, ALPRAZolam, alum & mag hydroxide-simeth, docusate sodium, ipratropium, levalbuterol, morphine injection, ondansetron **OR** ondansetron (ZOFRAN) IV, oxyCODONE  Xrays Dg Chest Port 1 View  08/05/2015   CLINICAL DATA:  Chest tubes, recent bronchoscopy  EXAM: PORTABLE CHEST 1 VIEW  COMPARISON:  Portable exam 0811 hours compared to 08/04/2015  FINDINGS: Pair of RIGHT thoracostomy tubes again identified.  Persistent mild to moderate RIGHT apex pneumothorax little changed.  Persistent consolidation of RIGHT middle and RIGHT lower lobes question atelectasis though infiltrate not excluded.  Blebs noted at RIGHT apex.  Heart size stable.  LEFT lung clear.  Bones unremarkable.  IMPRESSION: Persistent mild to moderate RIGHT pneumothorax despite thoracostomy tubes.  Persistent atelectasis versus consolidation of RIGHT middle and RIGHT lower lobes.   Electronically Signed   By: Lavonia Dana M.D.   On: 08/05/2015 08:20   Dg Chest Port 1 View  08/04/2015   CLINICAL DATA:  Shortness of breath.  EXAM: PORTABLE CHEST 1 VIEW  COMPARISON:  Same day.  FINDINGS: Two right-sided chest tubes are again noted and unchanged in position. Right-sided pneumothorax noted on prior exam is significantly smaller, with mild apical component remaining. Stable right basilar opacity is noted consistent with atelectasis or infiltrate and possible  associated pleural effusion. Left lung is clear and hyperexpanded. Mediastinal shift to the right is noted. Old left rib fractures are noted.  IMPRESSION: Mild right apical pneumothorax is noted which is significantly smaller since prior exam. 2 right-sided chest tubes are unchanged in position. Persistent right basilar opacity is noted consistent with atelectasis or infiltrate and possible associated effusion.   Electronically Signed   By: Marijo Conception,  M.D.   On: 08/04/2015 11:45   Dg Chest Port 1 View  08/04/2015   CLINICAL DATA:  Pneumothorax.  EXAM: PORTABLE CHEST 1 VIEW  COMPARISON:  08/03/2015.  CT 07/22/2015 .  FINDINGS: Two right chest tubes in stable position. Progression of right pneumothorax. Atelectasis and consolidation of the right lower lobe. Persistent right infrahilar mass. Left lung is clear. Heart size normal.  IMPRESSION: 1. Two right chest tubes in stable position. Significant progression of right-sided pneumothorax. Pneumothorax is approximately 40%. 2. Dense atelectasis and consolidation of the right lower lobe. Persistent right infrahilar mass. Critical Value/emergent results were called by telephone at the time of interpretation on 08/04/2015 at 7:46 am to nurse Lee,who verbally acknowledged these results.   Electronically Signed   By: Marcello Moores  Register   On: 08/04/2015 07:47    Assessment/Plan: S/P Procedure(s) (LRB): VIDEO BRONCHOSCOPY WITH ATTEMPTED INSERTION OF INTERBRONCHIAL VALVE (IBV) WITH FLUROSCOPY (N/A) INSERTION OF SECOND RIGHT CHEST TUBE WITH FLUROSCOPY (Right)   1 stable on current rx. CXR is scheduled for tomorrow- keep CT's to suction with ongoing air leak    LOS: 8 days    Grace Isaac 08/05/2015

## 2015-08-05 NOTE — Progress Notes (Signed)
[facility] PROGRESS NOTE  Gabriel [name] KGY:[id number] DOB: 24-Mar-1941 DOA: 07/28/2015 PCP: No PCP Per Patient  Assessment/Plan: #1 recurrent pneumothorax Questionable etiology. May be secondary to history of lung cancer, COPD and ongoing tobacco abuse. Patient status post video bronchoscopy with attempted insertion of intrabronchial valve with fluoroscopy, insertion of second right chest tube 08/02/2015. Chest x-ray on the morning of 08/04/2015 with worsening pneumothorax. Chest tube was placed back to suction per CT surgery with repeat chest x-ray with improvement in pneumothorax. Pain management. Per cardiothoracic surgery.  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 Earlie Server of oncology. Patient status post treatment with chemotherapy and radiation therapy, carboplatin and paclitaxel with partial response. Oncology plans to initiate therapy with carboplatin and gemcitabine on 08/08/2015. Outpatient follow-up with oncology.  #3 COPD with acute exacerbation Patient with wheezing noted on examination and right-sided rhonchi complaining of shortness of breath yesterday. Clinical improvement.. Chest x-ray repeat shows improved pneumothorax after chest tubes have been placed back on suction with persistent right basilar opacity consistent with atelectasis or infiltrate. Patient also noted to have a leukocytosis which is trending down. Continue Xopenex and Atrovent scheduled nebulizers, Symbicort,  IV Solu-Medrol, IV Levaquin.   #4 probable HCAP Repeat chest x-ray with persistent right basilar opacity noted consistent with atelectasis or infiltrate. Patient withdrawn process breath sounds and patient states feeling congested. Patient also noted to be wheezing yesterday. Clinical improvement. Urine Legionella antigen and urine pneumococcus antigen pending. Sputum Gram stain and culture pending. Patient with history of lung cancer and as such is immunocompromised with a  pneumothorax. Continue empiric IV Levaquin, scheduled nebs, Mucinex, flutter valve. Follow.  #5 leukocytosis Likely secondary to problem #4. Leukocytosis trending down. Sputum Gram stain and culture pending. Urine Legionella antigen and pneumococcus antigen pending.    #6 prophylaxis Lovenox for DVT prophylaxis.    Code Status: Full Family Communication: Updated patient and family at bedside.  Disposition Plan: Per CVTS   Consultants:  CVTS: Dr Servando Snare 07/28/2015  Procedures:  Video bronchoscopy with attempted insertion of intrabronchial valve with fluoroscopy insertion of second right chest tube with fluoroscopy 08/02/2015 per Dr. Servando Snare   chest x-ray 08/02/2015, 08/03/2015, 08/04/2015     Antibiotics:  IV Levaquin 08/04/2015  HPI/Subjective: Patient states shortness of breath has improved. Patient less anxious today.  Objective: Filed Vitals:   08/05/15 0358  BP: 105/62  Pulse: 76  Temp: 98.2 F (36.8 C)  Resp: 16    Intake/Output Summary (Last 24 hours) at 08/05/15 1153 Last data filed at 08/05/15 0900  Gross per 24 hour  Intake    480 ml  Output   1000 ml  Net   -520 ml   Filed Weights   07/28/15 0652 07/28/15 1039  Weight: 61.236 kg (135 lb) 60.1 kg (132 lb 7.9 oz)    Exam:   General:  NAD  Cardiovascular: RRR  Respiratory: Rhonchorous breath sounds on the right greater than left. Less Inspiratory and expiratory wheezing  Abdomen: Soft, nontender, nondistended, positive bowel sounds.   Musculoskeletal:  no clubbing cyanosis or edema.   Data Reviewed: Basic Metabolic Panel:  Recent Labs Lab 07/30/15 0509 08/01/15 2044 08/04/15 0519 08/05/15 0359  NA 138 138 135 131*  K 4.2 3.7 4.0 4.2  CL 104 104 100* 96*  CO2 '28 27 27 26  '$ GLUCOSE 89 93 119* 153*  BUN '19 14 14 14  '$ CREATININE 0.88 0.90 0.89 0.81  CALCIUM 9.0 9.1 9.1 9.3   Liver Function  Tests:  Recent Labs Lab 08/01/15 2044  AST 21  ALT 19  ALKPHOS 79  BILITOT 0.3   PROT 6.1*  ALBUMIN 3.3*   No results for input(s): LIPASE, AMYLASE in the last 168 hours. No results for input(s): AMMONIA in the last 168 hours. CBC:  Recent Labs Lab 07/30/15 0509 08/01/15 2044 08/04/15 1316 08/05/15 0359  WBC 9.0 7.1 14.1* 10.6*  NEUTROABS  --   --  11.6* 10.0*  HGB 13.4 13.1 13.8 12.8*  HCT 38.4* 39.7 41.4 38.2*  MCV 92.3 92.1 92.0 92.3  PLT 190 180 187 177   Cardiac Enzymes: No results for input(s): CKTOTAL, CKMB, CKMBINDEX, TROPONINI in the last 168 hours. BNP (last 3 results)  Recent Labs  04/04/15 1831 07/28/15 0720  BNP 53.8 42.7    ProBNP (last 3 results) No results for input(s): PROBNP in the last 8760 hours.  CBG: No results for input(s): GLUCAP in the last 168 hours.  Recent Results (from the past 240 hour(s))  Surgical pcr screen     Status: None   Collection Time: 08/01/15 11:47 PM  Result Value Ref Range Status   MRSA, PCR NEGATIVE NEGATIVE Final   Staphylococcus aureus NEGATIVE NEGATIVE Final    Comment:        The Xpert SA Assay (FDA approved for NASAL specimens in patients over 86 years of age), is one component of a comprehensive surveillance program.  Test performance has been validated by [facility] for patients greater than or equal to 31 year old. It is not intended to diagnose infection nor to guide or monitor treatment.   Culture, respiratory (NON-Expectorated)     Status: None   Collection Time: 08/02/15  8:26 AM  Result Value Ref Range Status   Specimen Description BRONCHIAL WASHINGS RIGHT  Final   Special Requests NONE  Final   Gram Stain   Final    MODERATE WBC PRESENT, PREDOMINANTLY PMN RARE SQUAMOUS EPITHELIAL CELLS PRESENT FEW GRAM POSITIVE COCCI IN PAIRS FEW GRAM NEGATIVE COCCOBACILLI Performed at [facility]    Culture   Final    Non-Pathogenic Oropharyngeal-type Flora Isolated. Performed at [facility]    Report Status 08/05/2015 FINAL  Final  Gram stain     Status: None    Collection Time: 08/02/15  8:26 AM  Result Value Ref Range Status   Specimen Description BRONCHIAL WASHINGS  Final   Special Requests NONE  Final   Gram Stain   Final    MODERATE WBC PRESENT, PREDOMINANTLY PMN FEW GRAM POSITIVE COCCI IN PAIRS FEW GRAM NEGATIVE COCCOBACILLI RARE GRAM POSITIVE RODS    Report Status 08/02/2015 FINAL  Final  Culture, blood (routine x 2) Call MD if unable to obtain prior to antibiotics being given     Status: None (Preliminary result)   Collection Time: 08/04/15  4:00 PM  Result Value Ref Range Status   Specimen Description BLOOD RIGHT ANTECUBITAL  Final   Special Requests BOTTLES DRAWN AEROBIC ONLY 10CC  Final   Culture NO GROWTH < 24 HOURS  Final   Report Status PENDING  Incomplete  Culture, blood (routine x 2) Call MD if unable to obtain prior to antibiotics being given     Status: None (Preliminary result)   Collection Time: 08/04/15  4:18 PM  Result Value Ref Range Status   Specimen Description BLOOD LEFT ANTECUBITAL  Final   Special Requests BOTTLES DRAWN AEROBIC AND ANAEROBIC 10CC 10CC  Final   Culture NO GROWTH <  24 HOURS  Final   Report Status PENDING  Incomplete     Studies: Dg Chest Port 1 View  08/05/2015   CLINICAL DATA:  Chest tubes, recent bronchoscopy  EXAM: PORTABLE CHEST 1 VIEW  COMPARISON:  Portable exam 0811 hours compared to 08/04/2015  FINDINGS: Pair of RIGHT thoracostomy tubes again identified.  Persistent mild to moderate RIGHT apex pneumothorax little changed.  Persistent consolidation of RIGHT middle and RIGHT lower lobes question atelectasis though infiltrate not excluded.  Blebs noted at RIGHT apex.  Heart size stable.  LEFT lung clear.  Bones unremarkable.  IMPRESSION: Persistent mild to moderate RIGHT pneumothorax despite thoracostomy tubes.  Persistent atelectasis versus consolidation of RIGHT middle and RIGHT lower lobes.   Electronically Signed   By: Lavonia Dana M.D.   On: 08/05/2015 08:20   Dg Chest Port 1  View  08/04/2015   CLINICAL DATA:  Shortness of breath.  EXAM: PORTABLE CHEST 1 VIEW  COMPARISON:  Same day.  FINDINGS: Two right-sided chest tubes are again noted and unchanged in position. Right-sided pneumothorax noted on prior exam is significantly smaller, with mild apical component remaining. Stable right basilar opacity is noted consistent with atelectasis or infiltrate and possible associated pleural effusion. Left lung is clear and hyperexpanded. Mediastinal shift to the right is noted. Old left rib fractures are noted.  IMPRESSION: Mild right apical pneumothorax is noted which is significantly smaller since prior exam. 2 right-sided chest tubes are unchanged in position. Persistent right basilar opacity is noted consistent with atelectasis or infiltrate and possible associated effusion.   Electronically Signed   By: Marijo Conception, M.D.   On: 08/04/2015 11:45   Dg Chest Port 1 View  08/04/2015   CLINICAL DATA:  Pneumothorax.  EXAM: PORTABLE CHEST 1 VIEW  COMPARISON:  08/03/2015.  CT 07/22/2015 .  FINDINGS: Two right chest tubes in stable position. Progression of right pneumothorax. Atelectasis and consolidation of the right lower lobe. Persistent right infrahilar mass. Left lung is clear. Heart size normal.  IMPRESSION: 1. Two right chest tubes in stable position. Significant progression of right-sided pneumothorax. Pneumothorax is approximately 40%. 2. Dense atelectasis and consolidation of the right lower lobe. Persistent right infrahilar mass. Critical Value/emergent results were called by telephone at the time of interpretation on 08/04/2015 at 7:46 am to nurse Lee,who verbally acknowledged these results.   Electronically Signed   By: Marcello Moores  Register   On: 08/04/2015 07:47    Scheduled Meds: . budesonide-formoterol  2 puff Inhalation BID  . enoxaparin (LOVENOX) injection  40 mg Subcutaneous Q24H  . guaiFENesin  1,200 mg Oral BID  . ipratropium  0.5 mg Nebulization Q6H  . levalbuterol  0.63  mg Nebulization Q6H  . levofloxacin (LEVAQUIN) IV  750 mg Intravenous Q24H  . methylPREDNISolone (SOLU-MEDROL) injection  80 mg Intravenous 3 times per day  . sodium chloride  3 mL Intravenous Q12H   Continuous Infusions:   Principal Problem:   Pneumothorax Active Problems:   Pneumothorax on right   Non-small cell carcinoma of right lung, stage 3 (HCC)   COPD (chronic obstructive pulmonary disease) (HCC)   Lung cancer (HCC)   HCAP (healthcare-associated pneumonia)   COPD exacerbation (HCC)    Time spent: 31 minutes    Jodine Muchmore M.D. [facility] Pager [phone]. If 7PM-7AM, please contact night-coverage at [email or web address], password [email or web address] 08/05/2015, 11:53 AM  LOS: 8 days

## 2015-08-06 ENCOUNTER — Inpatient Hospital Stay (HOSPITAL_COMMUNITY): Payer: 59

## 2015-08-06 LAB — CBC
HCT: 34 % — ABNORMAL LOW (ref 39.0–52.0)
Hemoglobin: 11.7 g/dL — ABNORMAL LOW (ref 13.0–17.0)
MCH: 31.7 pg (ref 26.0–34.0)
MCHC: 34.4 g/dL (ref 30.0–36.0)
MCV: 92.1 fL (ref 78.0–100.0)
PLATELETS: 174 10*3/uL (ref 150–400)
RBC: 3.69 MIL/uL — AB (ref 4.22–5.81)
RDW: 15.9 % — ABNORMAL HIGH (ref 11.5–15.5)
WBC: 13.3 10*3/uL — ABNORMAL HIGH (ref 4.0–10.5)

## 2015-08-06 LAB — BASIC METABOLIC PANEL
Anion gap: 10 (ref 5–15)
BUN: 22 mg/dL — ABNORMAL HIGH (ref 6–20)
CALCIUM: 9.4 mg/dL (ref 8.9–10.3)
CO2: 26 mmol/L (ref 22–32)
Chloride: 100 mmol/L — ABNORMAL LOW (ref 101–111)
Creatinine, Ser: 0.93 mg/dL (ref 0.61–1.24)
GFR calc Af Amer: >60 mL/min (ref >60)
GFR calc non Af Amer: >60 mL/min (ref >60)
GLUCOSE: 150 mg/dL — AB (ref 65–99)
Potassium: 4.1 mmol/L (ref 3.5–5.1)
SODIUM: 136 mmol/L (ref 135–145)

## 2015-08-06 MED ORDER — METHYLPREDNISOLONE SODIUM SUCC 125 MG IJ SOLR
60.0000 mg | Freq: Two times a day (BID) | INTRAMUSCULAR | Status: DC
  Administered 2015-08-06 – 2015-08-07 (×3): 60 mg via INTRAVENOUS
  Filled 2015-08-06 (×3): qty 2

## 2015-08-06 NOTE — Progress Notes (Addendum)
[address]       [place] [address]             [phone]      4 Days Post-Op Procedure(s) (LRB): VIDEO BRONCHOSCOPY WITH ATTEMPTED INSERTION OF INTERBRONCHIAL VALVE (IBV) WITH FLUROSCOPY (N/A) INSERTION OF SECOND RIGHT CHEST TUBE WITH FLUROSCOPY (Right) Subjective: Feeling better, cough is productive but clearing secretions better  Objective: Vital signs in last 24 hours: Temp:  [97.5 F (36.4 C)-97.8 F (36.6 C)] 97.8 F (36.6 C) (10/08 0448) Pulse Rate:  [80-96] 80 (10/08 0448) Cardiac Rhythm:  [-] Normal sinus rhythm (10/08 0700) Resp:  [18] 18 (10/08 0448) BP: (101-134)/(62-75) 101/62 mmHg (10/08 0448) SpO2:  [88 %-96 %] 93 % (10/08 0448)  Hemodynamic parameters for last 24 hours:    Intake/Output from previous day: 10/07 0701 - 10/08 0700 In: 600 [P.O.:600] Out: 1137 [Urine:1002; Chest Tube:135] Intake/Output this shift:    General appearance: alert, cooperative and no distress Heart: regular rate and rhythm Lungs: coarse BS- improved  Lab Results:  Recent Labs  08/05/15 0359 08/06/15 0414  WBC 10.6* 13.3*  HGB 12.8* 11.7*  HCT 38.2* 34.0*  PLT 177 174   BMET:  Recent Labs  08/05/15 0359 08/06/15 0414  NA 131* 136  K 4.2 4.1  CL 96* 100*  CO2 26 26  GLUCOSE 153* 150*  BUN 14 22*  CREATININE 0.81 0.93  CALCIUM 9.3 9.4    PT/INR: No results for input(s): LABPROT, INR in the last 72 hours. ABG    Component Value Date/Time   TCO2 22 04/04/2015 1839   CBG (last 3)  No results for input(s): GLUCAP in the last 72 hours.  Meds Scheduled Meds: . acetylcysteine  2 mL Nebulization QID  . budesonide-formoterol  2 puff Inhalation BID  . enoxaparin (LOVENOX) injection  40 mg Subcutaneous Q24H  . guaiFENesin  1,200 mg Oral BID  . ipratropium  0.5 mg Nebulization Q6H  . levalbuterol  0.63 mg Nebulization Q6H  . levofloxacin (LEVAQUIN) IV  750 mg Intravenous Q24H  . methylPREDNISolone (SOLU-MEDROL) injection  60 mg  Intravenous Q12H  . polyethylene glycol  17 g Oral Daily  . sodium chloride  3 mL Intravenous Q12H   Continuous Infusions:  PRN Meds:.acetaminophen **OR** acetaminophen, ALPRAZolam, alum & mag hydroxide-simeth, docusate sodium, ipratropium, levalbuterol, morphine injection, ondansetron **OR** ondansetron (ZOFRAN) IV, oxyCODONE  Xrays Dg Chest Port 1 View  08/06/2015   CLINICAL DATA:  Chest tube.  Shortness of breath.  EXAM: PORTABLE CHEST 1 VIEW  COMPARISON:  Chest radiograph from one day prior.  FINDINGS: Stable cardiomediastinal silhouette with normal heart size. Stable position of 2 right chest tubes. Small right apical pneumothorax, slightly decreased. No left pneumothorax. No pleural effusion. No pulmonary edema. Patchy masslike consolidation at the right lung base, decreased.  IMPRESSION: 1. Small right apical pneumothorax, slightly decreased. 2. Patchy masslike right basilar lung consolidation, decreased, in keeping with decreased atelectasis and persistent known right lung base mass.   Electronically Signed   By: Ilona Sorrel M.D.   On: 08/06/2015 07:33   Dg Chest Port 1 View  08/05/2015   CLINICAL DATA:  Chest tubes, recent bronchoscopy  EXAM: PORTABLE CHEST 1 VIEW  COMPARISON:  Portable exam 0811 hours compared to 08/04/2015  FINDINGS: Pair of RIGHT thoracostomy tubes again identified.  Persistent mild to moderate RIGHT apex pneumothorax little changed.  Persistent consolidation of RIGHT middle and RIGHT lower lobes question atelectasis though infiltrate not  excluded.  Blebs noted at RIGHT apex.  Heart size stable.  LEFT lung clear.  Bones unremarkable.  IMPRESSION: Persistent mild to moderate RIGHT pneumothorax despite thoracostomy tubes.  Persistent atelectasis versus consolidation of RIGHT middle and RIGHT lower lobes.   Electronically Signed   By: Lavonia Dana M.D.   On: 08/05/2015 08:20   Dg Chest Port 1 View  08/04/2015   CLINICAL DATA:  Shortness of breath.  EXAM: PORTABLE CHEST 1 VIEW   COMPARISON:  Same day.  FINDINGS: Two right-sided chest tubes are again noted and unchanged in position. Right-sided pneumothorax noted on prior exam is significantly smaller, with mild apical component remaining. Stable right basilar opacity is noted consistent with atelectasis or infiltrate and possible associated pleural effusion. Left lung is clear and hyperexpanded. Mediastinal shift to the right is noted. Old left rib fractures are noted.  IMPRESSION: Mild right apical pneumothorax is noted which is significantly smaller since prior exam. 2 right-sided chest tubes are unchanged in position. Persistent right basilar opacity is noted consistent with atelectasis or infiltrate and possible associated effusion.   Electronically Signed   By: Marijo Conception, M.D.   On: 08/04/2015 11:45    Assessment/Plan: S/P Procedure(s) (LRB): VIDEO BRONCHOSCOPY WITH ATTEMPTED INSERTION OF INTERBRONCHIAL VALVE (IBV) WITH FLUROSCOPY (N/A) INSERTION OF SECOND RIGHT CHEST TUBE WITH FLUROSCOPY (Right)  1 conts with air leak, pntx improved- cont current rx    LOS: 9 days    GOLD,[name] 08/06/2015   Chart reviewed, patient examined, agree with above. Right ptx is stable. There is a constant 1-4+ air leak from the [place] with normal breathing. The other [place] has no leak. Continue current suction. Check CXR Monday unless something changes

## 2015-08-06 NOTE — Progress Notes (Signed)
ANTIBIOTIC CONSULT NOTE - INITIAL  Pharmacy Consult for Levaquin  Indication: Probably HCAP   Allergies  Allergen Reactions  . Aleve [Naproxen] Rash    Patient Measurements: Height: '5\' 10"'$  (177.8 cm) Weight: 132 lb 7.9 oz (60.1 kg) IBW/kg (Calculated) : 73  Vital Signs: Temp: 97.8 F (36.6 C) (10/08 1322) Temp Source: Oral (10/08 1322) BP: 124/61 mmHg (10/08 1322) Pulse Rate: 95 (10/08 1322) Intake/Output from previous day: 10/07 0701 - 10/08 0700 In: 600 [P.O.:600] Out: 1137 [Urine:1002; Chest Tube:135] Intake/Output from this shift: Total I/O In: 360 [P.O.:360] Out: 651 [Urine:650; Stool:1]  Labs:  Recent Labs  08/04/15 0519 08/04/15 1316 08/05/15 0359 08/06/15 0414  WBC  --  14.1* 10.6* 13.3*  HGB  --  13.8 12.8* 11.7*  PLT  --  187 177 174  CREATININE 0.89  --  0.81 0.93   Estimated Creatinine Clearance: 58.3 mL/min (by C-G formula based on Cr of 0.93). No results for input(s): VANCOTROUGH, VANCOPEAK, VANCORANDOM, GENTTROUGH, GENTPEAK, GENTRANDOM, TOBRATROUGH, TOBRAPEAK, TOBRARND, AMIKACINPEAK, AMIKACINTROU, AMIKACIN in the last 72 hours.   Microbiology: Recent Results (from the past 720 hour(s))  Surgical pcr screen     Status: None   Collection Time: 08/01/15 11:47 PM  Result Value Ref Range Status   MRSA, PCR NEGATIVE NEGATIVE Final   Staphylococcus aureus NEGATIVE NEGATIVE Final    Comment:        The Xpert SA Assay (FDA approved for NASAL specimens in patients over 75 years of age), is one component of a comprehensive surveillance program.  Test performance has been validated by [facility] for patients greater than or equal to 75 year old. It is not intended to diagnose infection nor to guide or monitor treatment.   Culture, respiratory (NON-Expectorated)     Status: None   Collection Time: 08/02/15  8:26 AM  Result Value Ref Range Status   Specimen Description BRONCHIAL WASHINGS RIGHT  Final   Special Requests NONE  Final   Gram Stain    Final    MODERATE WBC PRESENT, PREDOMINANTLY PMN RARE SQUAMOUS EPITHELIAL CELLS PRESENT FEW GRAM POSITIVE COCCI IN PAIRS FEW GRAM NEGATIVE COCCOBACILLI Performed at [facility]    Culture   Final    Non-Pathogenic Oropharyngeal-type Flora Isolated. Performed at [facility]    Report Status 08/05/2015 FINAL  Final  Gram stain     Status: None   Collection Time: 08/02/15  8:26 AM  Result Value Ref Range Status   Specimen Description BRONCHIAL WASHINGS  Final   Special Requests NONE  Final   Gram Stain   Final    MODERATE WBC PRESENT, PREDOMINANTLY PMN FEW GRAM POSITIVE COCCI IN PAIRS FEW GRAM NEGATIVE COCCOBACILLI RARE GRAM POSITIVE RODS    Report Status 08/02/2015 FINAL  Final  Culture, blood (routine x 2) Call MD if unable to obtain prior to antibiotics being given     Status: None (Preliminary result)   Collection Time: 08/04/15  4:00 PM  Result Value Ref Range Status   Specimen Description BLOOD RIGHT ANTECUBITAL  Final   Special Requests BOTTLES DRAWN AEROBIC ONLY 10CC  Final   Culture NO GROWTH 2 DAYS  Final   Report Status PENDING  Incomplete  Culture, blood (routine x 2) Call MD if unable to obtain prior to antibiotics being given     Status: None (Preliminary result)   Collection Time: 08/04/15  4:18 PM  Result Value Ref Range Status   Specimen Description BLOOD LEFT ANTECUBITAL  Final   Special Requests BOTTLES DRAWN AEROBIC AND ANAEROBIC 10CC  Final   Culture NO GROWTH 2 DAYS  Final   Report Status PENDING  Incomplete    Medical History: Past Medical History  Diagnosis Date  . Spontaneous pneumothorax MANY YRS AGO AND AGAIN 04/04/15    HISTORY OF (ONLY)  . Cancer ([place])     skin cancer  . Lung mass     right / HAS HAD RADIATION AND CHEMO FOR LUNG CANCER  . Inguinal hernia   . Productive cough     "DUE TO RADIATION"  . History of nonmelanoma skin cancer   . Constipation     AFTER ANESTHESIA  . Difficulty sleeping   . Radiation  05/11/15-06/21/15    NSCLCA/right lung    Medications:  Prescriptions prior to admission  Medication Sig Dispense Refill Last Dose  . albuterol (PROVENTIL HFA;VENTOLIN HFA) 108 (90 BASE) MCG/ACT inhaler Inhale 1-2 puffs into the lungs every 6 (six) hours as needed for wheezing or shortness of breath. 1 Inhaler 0 07/28/2015 at Unknown time  . Ascorbic Acid (VITAMIN C) 1000 MG tablet Take 1,000 mg by mouth daily.   07/27/2015 at Unknown time  . b complex vitamins tablet Take 1 tablet by mouth daily.   07/27/2015 at Unknown time  . CALCIUM CITRATE PO Take 1 tablet by mouth daily.   07/27/2015 at Unknown time  . Cyanocobalamin (VITAMIN B 12 PO) Take 1 tablet by mouth daily.   07/27/2015 at Unknown time  . docusate sodium (COLACE) 100 MG capsule Take 100 mg by mouth 3 (three) times daily as needed for mild constipation.    07/27/2015 at Unknown time  . HYDROcodone-acetaminophen (NORCO/VICODIN) 5-325 MG tablet Take 1 tablet by mouth every 6 (six) hours as needed. pain   07/18/2015  . L-Arginine 1000 MG TABS Take 1,000 mg by mouth daily.   07/27/2015 at Unknown time  . magnesium 30 MG tablet Take 30 mg by mouth daily.   Past Month at Unknown time  . Multiple Vitamin (MULTI VITAMIN DAILY PO) Take 1 tablet by mouth daily.   07/27/2015 at Unknown time  . Omega-3 Fatty Acids (FISH OIL) 1000 MG CAPS Take 1,000 mg by mouth daily.   07/27/2015 at Unknown time  . potassium chloride (K-DUR) 10 MEQ tablet Take 10 mEq by mouth daily.   Past Month at Unknown time  . prochlorperazine (COMPAZINE) 10 MG tablet Take 1 tablet by mouth every 6 (six) hours as needed. n/v   unknown  . vitamin A 10000 UNIT capsule Take 10,000 Units by mouth daily.   Past Month at Unknown time   Assessment: 11 YOM with recurrent pneumothorax of questionable etiology s/p chest tube insertion. He is currently IV Levaquin for probably HCAP. Afeb, wbc up to 13.3. CrCl ~ 55-60 mL/min   10/6 BCx2>> NGTD  10/4 Bronch wash>> Normal flora   Goal of  Therapy:  Resolution of infection   Plan:  -Levaquin 750 mg IV Q 24 hours -Monitor CBC, renal fx, cultures and clinical progress   Albertina Parr, PharmD., BCPS Clinical Pharmacist Pager [phone]

## 2015-08-06 NOTE — Progress Notes (Signed)
[facility] PROGRESS NOTE  Gabriel [name] YBO:[id number] DOB: 1941/10/24 DOA: 07/28/2015 PCP: No PCP Per Patient  Assessment/Plan: #1 recurrent pneumothorax Questionable etiology. May be secondary to history of lung cancer, COPD and ongoing tobacco abuse. Patient status post video bronchoscopy with attempted insertion of intrabronchial valve with fluoroscopy, insertion of second right chest tube 08/02/2015. Chest x-ray on the morning of 08/04/2015 with worsening pneumothorax. Chest tube was placed back to suction per CT surgery with repeat chest x-ray with improvement in pneumothorax. Pain management. Per cardiothoracic surgery.  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 Earlie Server of oncology. Patient status post treatment with chemotherapy and radiation therapy, carboplatin and paclitaxel with partial response. Oncology plans to initiate therapy with carboplatin and gemcitabine on 08/08/2015. Outpatient follow-up with oncology.  #3 COPD with acute exacerbation Patient with wheezing noted on examination and right-sided rhonchi complaining of shortness of breath 2 days ago. Clinical improvement.. Chest x-ray repeat shows improved pneumothorax after chest tubes have been placed back on suction with persistent right basilar opacity consistent with atelectasis or infiltrate. Patient also noted to have a leukocytosis which is trending down. Continue Xopenex and Atrovent scheduled nebulizers, Symbicort,  IV Levaquin. Change IV Solu-Medrol to 60 mg IV every 12 hours.  #4 probable HCAP Repeat chest x-ray with persistent right basilar opacity noted consistent with atelectasis or infiltrate. Patient withdrawn process breath sounds and patient states feeling congested. Patient also noted to be wheezing 2 days ago. Clinical improvement. Urine Legionella antigen and urine pneumococcus antigen pending. Sputum Gram stain and culture negative. Patient with history of lung cancer and as such  is immunocompromised with a pneumothorax. Continue empiric IV Levaquin, scheduled nebs, Mucinex, flutter valve. Follow.  #5 leukocytosis Likely secondary to problem #4. Leukocytosis trending down. Sputum Gram stain and culture negative. Urine Legionella antigen and pneumococcus antigen pending. Continue empiric IV Levaquin.   #6 prophylaxis Lovenox for DVT prophylaxis.    Code Status: Full Family Communication: Updated patient and family at bedside.  Disposition Plan: Per CVTS   Consultants:  CVTS: Dr Servando Snare 07/28/2015  Procedures:  Video bronchoscopy with attempted insertion of intrabronchial valve with fluoroscopy insertion of second right chest tube with fluoroscopy 08/02/2015 per Dr. Servando Snare   chest x-ray 08/02/2015, 08/03/2015, 08/04/2015     Antibiotics:  IV Levaquin 08/04/2015  HPI/Subjective: Patient states shortness of breath has improved. Patient feeling better.  Objective: Filed Vitals:   08/06/15 0448  BP: 101/62  Pulse: 80  Temp: 97.8 F (36.6 C)  Resp: 18    Intake/Output Summary (Last 24 hours) at 08/06/15 1238 Last data filed at 08/06/15 0449  Gross per 24 hour  Intake    360 ml  Output   1062 ml  Net   -702 ml   Filed Weights   07/28/15 0652 07/28/15 1039  Weight: 61.236 kg (135 lb) 60.1 kg (132 lb 7.9 oz)    Exam:   General:  NAD  Cardiovascular: RRR  Respiratory: Less Rhonchorous breath sounds on the right greater than left. Less Inspiratory and expiratory wheezing  Abdomen: Soft, nontender, nondistended, positive bowel sounds.   Musculoskeletal:  no clubbing cyanosis or edema.   Data Reviewed: Basic Metabolic Panel:  Recent Labs Lab 08/01/15 2044 08/04/15 0519 08/05/15 0359 08/06/15 0414  NA 138 135 131* 136  K 3.7 4.0 4.2 4.1  CL 104 100* 96* 100*  CO2 '27 27 26 26  '$ GLUCOSE 93 119* 153* 150*  BUN '14 14 14 '$ 22*  CREATININE  0.90 0.89 0.81 0.93  CALCIUM 9.1 9.1 9.3 9.4   Liver Function Tests:  Recent Labs Lab  08/01/15 2044  AST 21  ALT 19  ALKPHOS 79  BILITOT 0.3  PROT 6.1*  ALBUMIN 3.3*   No results for input(s): LIPASE, AMYLASE in the last 168 hours. No results for input(s): AMMONIA in the last 168 hours. CBC:  Recent Labs Lab 08/01/15 2044 08/04/15 1316 08/05/15 0359 08/06/15 0414  WBC 7.1 14.1* 10.6* 13.3*  NEUTROABS  --  11.6* 10.0*  --   HGB 13.1 13.8 12.8* 11.7*  HCT 39.7 41.4 38.2* 34.0*  MCV 92.1 92.0 92.3 92.1  PLT 180 187 177 174   Cardiac Enzymes: No results for input(s): CKTOTAL, CKMB, CKMBINDEX, TROPONINI in the last 168 hours. BNP (last 3 results)  Recent Labs  04/04/15 1831 07/28/15 0720  BNP 53.8 42.7    ProBNP (last 3 results) No results for input(s): PROBNP in the last 8760 hours.  CBG: No results for input(s): GLUCAP in the last 168 hours.  Recent Results (from the past 240 hour(s))  Surgical pcr screen     Status: None   Collection Time: 08/01/15 11:47 PM  Result Value Ref Range Status   MRSA, PCR NEGATIVE NEGATIVE Final   Staphylococcus aureus NEGATIVE NEGATIVE Final    Comment:        The Xpert SA Assay (FDA approved for NASAL specimens in patients over 53 years of age), is one component of a comprehensive surveillance program.  Test performance has been validated by [facility] for patients greater than or equal to 68 year old. It is not intended to diagnose infection nor to guide or monitor treatment.   Culture, respiratory (NON-Expectorated)     Status: None   Collection Time: 08/02/15  8:26 AM  Result Value Ref Range Status   Specimen Description BRONCHIAL WASHINGS RIGHT  Final   Special Requests NONE  Final   Gram Stain   Final    MODERATE WBC PRESENT, PREDOMINANTLY PMN RARE SQUAMOUS EPITHELIAL CELLS PRESENT FEW GRAM POSITIVE COCCI IN PAIRS FEW GRAM NEGATIVE COCCOBACILLI Performed at [facility]    Culture   Final    Non-Pathogenic Oropharyngeal-type Flora Isolated. Performed at [facility]    Report  Status 08/05/2015 FINAL  Final  Gram stain     Status: None   Collection Time: 08/02/15  8:26 AM  Result Value Ref Range Status   Specimen Description BRONCHIAL WASHINGS  Final   Special Requests NONE  Final   Gram Stain   Final    MODERATE WBC PRESENT, PREDOMINANTLY PMN FEW GRAM POSITIVE COCCI IN PAIRS FEW GRAM NEGATIVE COCCOBACILLI RARE GRAM POSITIVE RODS    Report Status 08/02/2015 FINAL  Final  Culture, blood (routine x 2) Call MD if unable to obtain prior to antibiotics being given     Status: None (Preliminary result)   Collection Time: 08/04/15  4:00 PM  Result Value Ref Range Status   Specimen Description BLOOD RIGHT ANTECUBITAL  Final   Special Requests BOTTLES DRAWN AEROBIC ONLY 10CC  Final   Culture NO GROWTH 2 DAYS  Final   Report Status PENDING  Incomplete  Culture, blood (routine x 2) Call MD if unable to obtain prior to antibiotics being given     Status: None (Preliminary result)   Collection Time: 08/04/15  4:18 PM  Result Value Ref Range Status   Specimen Description BLOOD LEFT ANTECUBITAL  Final   Special Requests BOTTLES DRAWN  AEROBIC AND ANAEROBIC 10CC  Final   Culture NO GROWTH 2 DAYS  Final   Report Status PENDING  Incomplete     Studies: Dg Chest Port 1 View  08/06/2015   CLINICAL DATA:  Chest tube.  Shortness of breath.  EXAM: PORTABLE CHEST 1 VIEW  COMPARISON:  Chest radiograph from one day prior.  FINDINGS: Stable cardiomediastinal silhouette with normal heart size. Stable position of 2 right chest tubes. Small right apical pneumothorax, slightly decreased. No left pneumothorax. No pleural effusion. No pulmonary edema. Patchy masslike consolidation at the right lung base, decreased.  IMPRESSION: 1. Small right apical pneumothorax, slightly decreased. 2. Patchy masslike right basilar lung consolidation, decreased, in keeping with decreased atelectasis and persistent known right lung base mass.   Electronically Signed   By: Ilona Sorrel M.D.   On: 08/06/2015  07:33   Dg Chest Port 1 View  08/05/2015   CLINICAL DATA:  Chest tubes, recent bronchoscopy  EXAM: PORTABLE CHEST 1 VIEW  COMPARISON:  Portable exam 0811 hours compared to 08/04/2015  FINDINGS: Pair of RIGHT thoracostomy tubes again identified.  Persistent mild to moderate RIGHT apex pneumothorax little changed.  Persistent consolidation of RIGHT middle and RIGHT lower lobes question atelectasis though infiltrate not excluded.  Blebs noted at RIGHT apex.  Heart size stable.  LEFT lung clear.  Bones unremarkable.  IMPRESSION: Persistent mild to moderate RIGHT pneumothorax despite thoracostomy tubes.  Persistent atelectasis versus consolidation of RIGHT middle and RIGHT lower lobes.   Electronically Signed   By: Lavonia Dana M.D.   On: 08/05/2015 08:20    Scheduled Meds: . acetylcysteine  2 mL Nebulization QID  . budesonide-formoterol  2 puff Inhalation BID  . enoxaparin (LOVENOX) injection  40 mg Subcutaneous Q24H  . guaiFENesin  1,200 mg Oral BID  . ipratropium  0.5 mg Nebulization Q6H  . levalbuterol  0.63 mg Nebulization Q6H  . levofloxacin (LEVAQUIN) IV  750 mg Intravenous Q24H  . methylPREDNISolone (SOLU-MEDROL) injection  60 mg Intravenous Q12H  . polyethylene glycol  17 g Oral Daily  . sodium chloride  3 mL Intravenous Q12H   Continuous Infusions:   Principal Problem:   Pneumothorax Active Problems:   Pneumothorax on right   Non-small cell carcinoma of right lung, stage 3 (HCC)   COPD (chronic obstructive pulmonary disease) (HCC)   Lung cancer (HCC)   HCAP (healthcare-associated pneumonia)   COPD exacerbation (HCC)   Chest tube in place    Time spent: 32 minutes    [facility] M.D. [facility] Pager [phone]. If 7PM-7AM, please contact night-coverage at [email or web address], password [email or web address] 08/06/2015, 12:38 PM  LOS: 9 days

## 2015-08-07 DIAGNOSIS — J939 Pneumothorax, unspecified: Secondary | ICD-10-CM | POA: Insufficient documentation

## 2015-08-07 LAB — BASIC METABOLIC PANEL
Anion gap: 10 (ref 5–15)
BUN: 19 mg/dL (ref 6–20)
CALCIUM: 9.3 mg/dL (ref 8.9–10.3)
CO2: 27 mmol/L (ref 22–32)
CREATININE: 0.83 mg/dL (ref 0.61–1.24)
Chloride: 100 mmol/L — ABNORMAL LOW (ref 101–111)
GFR calc Af Amer: >60 mL/min (ref >60)
GLUCOSE: 133 mg/dL — AB (ref 65–99)
Potassium: 4.4 mmol/L (ref 3.5–5.1)
Sodium: 137 mmol/L (ref 135–145)

## 2015-08-07 LAB — CBC
HCT: 35.4 % — ABNORMAL LOW (ref 39.0–52.0)
Hemoglobin: 11.7 g/dL — ABNORMAL LOW (ref 13.0–17.0)
MCH: 30.8 pg (ref 26.0–34.0)
MCHC: 33.1 g/dL (ref 30.0–36.0)
MCV: 93.2 fL (ref 78.0–100.0)
PLATELETS: 188 10*3/uL (ref 150–400)
RBC: 3.8 MIL/uL — ABNORMAL LOW (ref 4.22–5.81)
RDW: 16.2 % — AB (ref 11.5–15.5)
WBC: 10.6 10*3/uL — AB (ref 4.0–10.5)

## 2015-08-07 MED ORDER — LEVALBUTEROL HCL 0.63 MG/3ML IN NEBU
0.6300 mg | INHALATION_SOLUTION | Freq: Four times a day (QID) | RESPIRATORY_TRACT | Status: DC
  Administered 2015-08-08 – 2015-08-13 (×19): 0.63 mg via RESPIRATORY_TRACT
  Filled 2015-08-07 (×23): qty 3

## 2015-08-07 MED ORDER — IPRATROPIUM BROMIDE 0.02 % IN SOLN
0.5000 mg | Freq: Four times a day (QID) | RESPIRATORY_TRACT | Status: DC
  Administered 2015-08-08 – 2015-08-13 (×19): 0.5 mg via RESPIRATORY_TRACT
  Filled 2015-08-07 (×23): qty 2.5

## 2015-08-07 MED ORDER — PREDNISONE 20 MG PO TABS
60.0000 mg | ORAL_TABLET | Freq: Every day | ORAL | Status: DC
  Administered 2015-08-08 – 2015-08-09 (×2): 60 mg via ORAL
  Filled 2015-08-07 (×2): qty 3

## 2015-08-07 MED ORDER — LEVOFLOXACIN 750 MG PO TABS
750.0000 mg | ORAL_TABLET | Freq: Every day | ORAL | Status: DC
  Administered 2015-08-08 – 2015-08-11 (×4): 750 mg via ORAL
  Filled 2015-08-07 (×4): qty 1

## 2015-08-07 MED ORDER — LEVALBUTEROL HCL 0.63 MG/3ML IN NEBU
0.6300 mg | INHALATION_SOLUTION | RESPIRATORY_TRACT | Status: DC | PRN
  Administered 2015-08-09 – 2015-08-17 (×4): 0.63 mg via RESPIRATORY_TRACT
  Filled 2015-08-07 (×3): qty 3

## 2015-08-07 NOTE — Progress Notes (Addendum)
[address]       [place] [address]             [phone]      5 Days Post-Op Procedure(s) (LRB): VIDEO BRONCHOSCOPY WITH ATTEMPTED INSERTION OF INTERBRONCHIAL VALVE (IBV) WITH FLUROSCOPY (N/A) INSERTION OF SECOND RIGHT CHEST TUBE WITH FLUROSCOPY (Right) Subjective: Feels pretty well, no new complaints  Objective: Vital signs in last 24 hours: Temp:  [97.5 F (36.4 C)-97.8 F (36.6 C)] 97.8 F (36.6 C) (10/09 0438) Pulse Rate:  [77-95] 77 (10/09 0438) Cardiac Rhythm:  [-] Normal sinus rhythm (10/08 2015) Resp:  [18] 18 (10/09 0438) BP: (101-124)/(56-66) 109/66 mmHg (10/09 0438) SpO2:  [93 %-98 %] 98 % (10/09 0438)  Hemodynamic parameters for last 24 hours:    Intake/Output from previous day: 10/08 0701 - 10/09 0700 In: 360 [P.O.:360] Out: 1451 [Urine:1450; Stool:1] Intake/Output this shift: Total I/O In: -  Out: 100 [Chest Tube:100]  General appearance: alert, cooperative and no distress Heart: regular rate and rhythm Lungs: coarse BS on right  Lab Results:  Recent Labs  08/06/15 0414 08/07/15 0335  WBC 13.3* 10.6*  HGB 11.7* 11.7*  HCT 34.0* 35.4*  PLT 174 188   BMET:  Recent Labs  08/06/15 0414 08/07/15 0335  NA 136 137  K 4.1 4.4  CL 100* 100*  CO2 26 27  GLUCOSE 150* 133*  BUN 22* 19  CREATININE 0.93 0.83  CALCIUM 9.4 9.3    PT/INR: No results for input(s): LABPROT, INR in the last 72 hours. ABG    Component Value Date/Time   TCO2 22 04/04/2015 1839   CBG (last 3)  No results for input(s): GLUCAP in the last 72 hours.  Meds Scheduled Meds: . budesonide-formoterol  2 puff Inhalation BID  . enoxaparin (LOVENOX) injection  40 mg Subcutaneous Q24H  . guaiFENesin  1,200 mg Oral BID  . ipratropium  0.5 mg Nebulization Q6H  . levalbuterol  0.63 mg Nebulization Q6H  . levofloxacin (LEVAQUIN) IV  750 mg Intravenous Q24H  . methylPREDNISolone (SOLU-MEDROL) injection  60 mg Intravenous Q12H  . polyethylene glycol   17 g Oral Daily  . sodium chloride  3 mL Intravenous Q12H   Continuous Infusions:  PRN Meds:.acetaminophen **OR** acetaminophen, ALPRAZolam, alum & mag hydroxide-simeth, docusate sodium, ipratropium, levalbuterol, morphine injection, ondansetron **OR** ondansetron (ZOFRAN) IV, oxyCODONE  Xrays Dg Chest Port 1 View  08/06/2015   CLINICAL DATA:  Chest tube.  Shortness of breath.  EXAM: PORTABLE CHEST 1 VIEW  COMPARISON:  Chest radiograph from one day prior.  FINDINGS: Stable cardiomediastinal silhouette with normal heart size. Stable position of 2 right chest tubes. Small right apical pneumothorax, slightly decreased. No left pneumothorax. No pleural effusion. No pulmonary edema. Patchy masslike consolidation at the right lung base, decreased.  IMPRESSION: 1. Small right apical pneumothorax, slightly decreased. 2. Patchy masslike right basilar lung consolidation, decreased, in keeping with decreased atelectasis and persistent known right lung base mass.   Electronically Signed   By: Ilona Sorrel M.D.   On: 08/06/2015 07:33   Dg Chest Port 1 View  08/05/2015   CLINICAL DATA:  Chest tubes, recent bronchoscopy  EXAM: PORTABLE CHEST 1 VIEW  COMPARISON:  Portable exam 0811 hours compared to 08/04/2015  FINDINGS: Pair of RIGHT thoracostomy tubes again identified.  Persistent mild to moderate RIGHT apex pneumothorax little changed.  Persistent consolidation of RIGHT middle and RIGHT lower lobes question atelectasis though infiltrate not excluded.  Blebs noted  at RIGHT apex.  Heart size stable.  LEFT lung clear.  Bones unremarkable.  IMPRESSION: Persistent mild to moderate RIGHT pneumothorax despite thoracostomy tubes.  Persistent atelectasis versus consolidation of RIGHT middle and RIGHT lower lobes.   Electronically Signed   By: Lavonia Dana M.D.   On: 08/05/2015 08:20    Assessment/Plan: S/P Procedure(s) (LRB): VIDEO BRONCHOSCOPY WITH ATTEMPTED INSERTION OF INTERBRONCHIAL VALVE (IBV) WITH FLUROSCOPY  (N/A) INSERTION OF SECOND RIGHT CHEST TUBE WITH FLUROSCOPY (Right)  1 Stable with persistent air leak, no real clinical change- cont same tx for now 2 CXR in am   LOS: 10 days    [name],[name] 08/07/2015   Chart reviewed, patient examined, agree with above.

## 2015-08-07 NOTE — Progress Notes (Signed)
[facility] PROGRESS NOTE  Gabriel [name]:[id number] DOB: 06-18-1941 DOA: 07/28/2015 PCP: No PCP Per Patient  Assessment/Plan: #1 recurrent pneumothorax Questionable etiology. May be secondary to history of lung cancer, COPD and ongoing tobacco abuse. Patient status post video bronchoscopy with attempted insertion of intrabronchial valve with fluoroscopy, insertion of second right chest tube 08/02/2015. Chest x-ray on the morning of 08/04/2015 with worsening pneumothorax. Chest tube was placed back to suction per CT surgery with repeat chest x-ray with improvement in pneumothorax. Pain management. Per cardiothoracic surgery.  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 Earlie Server of oncology. Patient status post treatment with chemotherapy and radiation therapy, carboplatin and paclitaxel with partial response. Oncology plans to initiate therapy with carboplatin and gemcitabine on 08/08/2015, which may need to be rescheduled. Outpatient follow-up with oncology.  #3 COPD with acute exacerbation Patient with wheezing noted on examination and right-sided rhonchi complaining of shortness of breath 2 days ago. Clinical improvement.. Chest x-ray repeat shows improved pneumothorax after chest tubes have been placed back on suction with persistent right basilar opacity consistent with atelectasis or infiltrate. Patient also noted to have a leukocytosis which is trending down. Continue Xopenex and Atrovent scheduled nebulizers, Symbicort,  IV Levaquin. Continue IV Solu-Medrol to 60 mg IV 12 hours.  #4 probable HCAP Repeat chest x-ray with persistent right basilar opacity noted consistent with atelectasis or infiltrate. Patient withdrawn process breath sounds and patient states feeling congested. Patient also noted to be wheezing 2 days ago. Clinical improvement. Urine Legionella antigen and urine pneumococcus antigen pending. Sputum Gram stain and culture negative. Patient with  history of lung cancer and as such is immunocompromised with a pneumothorax. Continue empiric IV Levaquin, scheduled nebs, Mucinex, flutter valve. Transition to oral antibiotics tomorrow. Follow.  #5 leukocytosis Likely secondary to problem #4. Leukocytosis trending down. Sputum Gram stain and culture negative. Urine Legionella antigen and pneumococcus antigen pending. Continue empiric IV Levaquin.   #6 prophylaxis Lovenox for DVT prophylaxis.    Code Status: Full Family Communication: Updated patient and family at bedside.  Disposition Plan: Per CVTS   Consultants:  CVTS: Dr Servando Snare 07/28/2015  Procedures:  Video bronchoscopy with attempted insertion of intrabronchial valve with fluoroscopy insertion of second right chest tube with fluoroscopy 08/02/2015 per Dr. Servando Snare   chest x-ray 08/02/2015, 08/03/2015, 08/04/2015     Antibiotics:  IV Levaquin 08/04/2015  HPI/Subjective: Patient states shortness of breath has improved. Patient feeling better.  Objective: Filed Vitals:   08/07/15 1300  BP: 93/55  Pulse: 94  Temp: 99.5 F (37.5 C)  Resp: 18    Intake/Output Summary (Last 24 hours) at 08/07/15 1428 Last data filed at 08/07/15 1230  Gross per 24 hour  Intake    480 ml  Output   2000 ml  Net  -1520 ml   Filed Weights   07/28/15 0652 07/28/15 1039  Weight: 61.236 kg (135 lb) 60.1 kg (132 lb 7.9 oz)    Exam:   General:  NAD  Cardiovascular: RRR  Respiratory: Less Rhonchorous breath sounds on the right greater than left. Less Inspiratory and expiratory wheezing  Abdomen: Soft, nontender, nondistended, positive bowel sounds.   Musculoskeletal:  no clubbing cyanosis or edema.   Data Reviewed: Basic Metabolic Panel:  Recent Labs Lab 08/01/15 2044 08/04/15 0519 08/05/15 0359 08/06/15 0414 08/07/15 0335  NA 138 135 131* 136 137  K 3.7 4.0 4.2 4.1 4.4  CL 104 100* 96* 100* 100*  CO2 '27 27 26 '$ 26  27  GLUCOSE 93 119* 153* 150* 133*  BUN '14 14  14 '$ 22* 19  CREATININE 0.90 0.89 0.81 0.93 0.83  CALCIUM 9.1 9.1 9.3 9.4 9.3   Liver Function Tests:  Recent Labs Lab 08/01/15 2044  AST 21  ALT 19  ALKPHOS 79  BILITOT 0.3  PROT 6.1*  ALBUMIN 3.3*   No results for input(s): LIPASE, AMYLASE in the last 168 hours. No results for input(s): AMMONIA in the last 168 hours. CBC:  Recent Labs Lab 08/01/15 2044 08/04/15 1316 08/05/15 0359 08/06/15 0414 08/07/15 0335  WBC 7.1 14.1* 10.6* 13.3* 10.6*  NEUTROABS  --  11.6* 10.0*  --   --   HGB 13.1 13.8 12.8* 11.7* 11.7*  HCT 39.7 41.4 38.2* 34.0* 35.4*  MCV 92.1 92.0 92.3 92.1 93.2  PLT 180 187 177 174 188   Cardiac Enzymes: No results for input(s): CKTOTAL, CKMB, CKMBINDEX, TROPONINI in the last 168 hours. BNP (last 3 results)  Recent Labs  04/04/15 1831 07/28/15 0720  BNP 53.8 42.7    ProBNP (last 3 results) No results for input(s): PROBNP in the last 8760 hours.  CBG: No results for input(s): GLUCAP in the last 168 hours.  Recent Results (from the past 240 hour(s))  Surgical pcr screen     Status: None   Collection Time: 08/01/15 11:47 PM  Result Value Ref Range Status   MRSA, PCR NEGATIVE NEGATIVE Final   Staphylococcus aureus NEGATIVE NEGATIVE Final    Comment:        The Xpert SA Assay (FDA approved for NASAL specimens in patients over 60 years of age), is one component of a comprehensive surveillance program.  Test performance has been validated by [facility] for patients greater than or equal to 48 year old. It is not intended to diagnose infection nor to guide or monitor treatment.   Culture, respiratory (NON-Expectorated)     Status: None   Collection Time: 08/02/15  8:26 AM  Result Value Ref Range Status   Specimen Description BRONCHIAL WASHINGS RIGHT  Final   Special Requests NONE  Final   Gram Stain   Final    MODERATE WBC PRESENT, PREDOMINANTLY PMN RARE SQUAMOUS EPITHELIAL CELLS PRESENT FEW GRAM POSITIVE COCCI IN PAIRS FEW GRAM  NEGATIVE COCCOBACILLI Performed at [facility]    Culture   Final    Non-Pathogenic Oropharyngeal-type Flora Isolated. Performed at [facility]    Report Status 08/05/2015 FINAL  Final  Gram stain     Status: None   Collection Time: 08/02/15  8:26 AM  Result Value Ref Range Status   Specimen Description BRONCHIAL WASHINGS  Final   Special Requests NONE  Final   Gram Stain   Final    MODERATE WBC PRESENT, PREDOMINANTLY PMN FEW GRAM POSITIVE COCCI IN PAIRS FEW GRAM NEGATIVE COCCOBACILLI RARE GRAM POSITIVE RODS    Report Status 08/02/2015 FINAL  Final  Culture, blood (routine x 2) Call MD if unable to obtain prior to antibiotics being given     Status: None (Preliminary result)   Collection Time: 08/04/15  4:00 PM  Result Value Ref Range Status   Specimen Description BLOOD RIGHT ANTECUBITAL  Final   Special Requests BOTTLES DRAWN AEROBIC ONLY 10CC  Final   Culture NO GROWTH 3 DAYS  Final   Report Status PENDING  Incomplete  Culture, blood (routine x 2) Call MD if unable to obtain prior to antibiotics being given     Status: None (Preliminary result)  Collection Time: 08/04/15  4:18 PM  Result Value Ref Range Status   Specimen Description BLOOD LEFT ANTECUBITAL  Final   Special Requests BOTTLES DRAWN AEROBIC AND ANAEROBIC 10CC  Final   Culture NO GROWTH 3 DAYS  Final   Report Status PENDING  Incomplete     Studies: Dg Chest Port 1 View  08/06/2015   CLINICAL DATA:  Chest tube.  Shortness of breath.  EXAM: PORTABLE CHEST 1 VIEW  COMPARISON:  Chest radiograph from one day prior.  FINDINGS: Stable cardiomediastinal silhouette with normal heart size. Stable position of 2 right chest tubes. Small right apical pneumothorax, slightly decreased. No left pneumothorax. No pleural effusion. No pulmonary edema. Patchy masslike consolidation at the right lung base, decreased.  IMPRESSION: 1. Small right apical pneumothorax, slightly decreased. 2. Patchy masslike right basilar  lung consolidation, decreased, in keeping with decreased atelectasis and persistent known right lung base mass.   Electronically Signed   By: Ilona Sorrel M.D.   On: 08/06/2015 07:33    Scheduled Meds: . budesonide-formoterol  2 puff Inhalation BID  . enoxaparin (LOVENOX) injection  40 mg Subcutaneous Q24H  . guaiFENesin  1,200 mg Oral BID  . ipratropium  0.5 mg Nebulization Q6H  . levalbuterol  0.63 mg Nebulization Q6H  . levofloxacin (LEVAQUIN) IV  750 mg Intravenous Q24H  . methylPREDNISolone (SOLU-MEDROL) injection  60 mg Intravenous Q12H  . polyethylene glycol  17 g Oral Daily  . sodium chloride  3 mL Intravenous Q12H   Continuous Infusions:   Principal Problem:   Pneumothorax Active Problems:   Pneumothorax on right   Non-small cell carcinoma of right lung, stage 3 (HCC)   COPD (chronic obstructive pulmonary disease) (HCC)   Lung cancer (HCC)   HCAP (healthcare-associated pneumonia)   COPD exacerbation (HCC)   Chest tube in place    Time spent: 57 minutes    [facility] M.D. [facility] Pager [phone]. If 7PM-7AM, please contact night-coverage at [email or web address], password [facility] 08/07/2015, 2:28 PM  LOS: 10 days

## 2015-08-08 ENCOUNTER — Inpatient Hospital Stay (HOSPITAL_COMMUNITY): Payer: 59

## 2015-08-08 ENCOUNTER — Other Ambulatory Visit: Payer: 59

## 2015-08-08 ENCOUNTER — Ambulatory Visit: Payer: 59

## 2015-08-08 LAB — BASIC METABOLIC PANEL
ANION GAP: 10 (ref 5–15)
BUN: 16 mg/dL (ref 6–20)
CHLORIDE: 101 mmol/L (ref 101–111)
CO2: 26 mmol/L (ref 22–32)
Calcium: 9.1 mg/dL (ref 8.9–10.3)
Creatinine, Ser: 0.83 mg/dL (ref 0.61–1.24)
GFR calc Af Amer: >60 mL/min (ref >60)
GLUCOSE: 101 mg/dL — AB (ref 65–99)
POTASSIUM: 4 mmol/L (ref 3.5–5.1)
SODIUM: 137 mmol/L (ref 135–145)

## 2015-08-08 LAB — CBC
HCT: 35.7 % — ABNORMAL LOW (ref 39.0–52.0)
HEMOGLOBIN: 11.9 g/dL — AB (ref 13.0–17.0)
MCH: 30.7 pg (ref 26.0–34.0)
MCHC: 33.3 g/dL (ref 30.0–36.0)
MCV: 92.2 fL (ref 78.0–100.0)
PLATELETS: 189 10*3/uL (ref 150–400)
RBC: 3.87 MIL/uL — AB (ref 4.22–5.81)
RDW: 16.2 % — ABNORMAL HIGH (ref 11.5–15.5)
WBC: 8.1 10*3/uL (ref 4.0–10.5)

## 2015-08-08 LAB — MAGNESIUM: MAGNESIUM: 1.7 mg/dL (ref 1.7–2.4)

## 2015-08-08 MED ORDER — MAGNESIUM SULFATE 2 GM/50ML IV SOLN
2.0000 g | Freq: Once | INTRAVENOUS | Status: AC
  Administered 2015-08-08: 2 g via INTRAVENOUS
  Filled 2015-08-08: qty 50

## 2015-08-08 NOTE — Progress Notes (Signed)
OT Cancellation Note  Patient Details Name: Gabriel [name] MRN: [id number] DOB: 1941/08/22   Cancelled Treatment:    Reason Eval/Treat Not Completed:  (Pt with no OT concerns/needs at this time). Pt reports he has been up and walking for 10 minutes and dressing/bathing himself. OT signing off.  Benito Mccreedy  OTR/L [phone]  08/08/2015, 11:17 AM

## 2015-08-08 NOTE — Progress Notes (Addendum)
[address]       [place] [address]             [phone]      6 Days Post-Op Procedure(s) (LRB): VIDEO BRONCHOSCOPY WITH ATTEMPTED INSERTION OF INTERBRONCHIAL VALVE (IBV) WITH FLUROSCOPY (N/A) INSERTION OF SECOND RIGHT CHEST TUBE WITH FLUROSCOPY (Right) Subjective: Feels ok, no new issues  Objective: Vital signs in last 24 hours: Temp:  [97.5 F (36.4 C)-99.5 F (37.5 C)] 98.1 F (36.7 C) (10/10 0454) Pulse Rate:  [79-105] 79 (10/10 0454) Cardiac Rhythm:  [-] Other (Comment) (10/10 0735) Resp:  [16-80] 18 (10/10 0454) BP: (93-135)/(55-72) 120/72 mmHg (10/10 0454) SpO2:  [94 %-100 %] 96 % (10/10 0837) FiO2 (%):  [30 %] 30 % (10/09 1401)  Hemodynamic parameters for last 24 hours:    Intake/Output from previous day: 10/09 0701 - 10/10 0700 In: 1800 [P.O.:1200; IV Piggyback:600] Out: 3171 [Urine:2950; Stool:1; Chest Tube:220] Intake/Output this shift:    General appearance: alert, cooperative and no distress Heart: regular rate and rhythm Lungs: coarse BS- improved  Lab Results:  Recent Labs  08/07/15 0335 08/08/15 0422  WBC 10.6* 8.1  HGB 11.7* 11.9*  HCT 35.4* 35.7*  PLT 188 189   BMET:  Recent Labs  08/07/15 0335 08/08/15 0422  NA 137 137  K 4.4 4.0  CL 100* 101  CO2 27 26  GLUCOSE 133* 101*  BUN 19 16  CREATININE 0.83 0.83  CALCIUM 9.3 9.1    PT/INR: No results for input(s): LABPROT, INR in the last 72 hours. ABG    Component Value Date/Time   TCO2 22 04/04/2015 1839   CBG (last 3)  No results for input(s): GLUCAP in the last 72 hours.  Meds Scheduled Meds: . budesonide-formoterol  2 puff Inhalation BID  . enoxaparin (LOVENOX) injection  40 mg Subcutaneous Q24H  . guaiFENesin  1,200 mg Oral BID  . ipratropium  0.5 mg Nebulization QID  . levalbuterol  0.63 mg Nebulization QID  . levofloxacin  750 mg Oral Daily  . magnesium sulfate 1 - 4 g bolus IVPB  2 g Intravenous Once  . polyethylene glycol  17 g Oral  Daily  . predniSONE  60 mg Oral Q breakfast  . sodium chloride  3 mL Intravenous Q12H   Continuous Infusions:  PRN Meds:.acetaminophen **OR** acetaminophen, ALPRAZolam, alum & mag hydroxide-simeth, docusate sodium, ipratropium, levalbuterol, morphine injection, ondansetron **OR** ondansetron (ZOFRAN) IV, oxyCODONE  Xrays Dg Chest Port 1 View  08/08/2015   CLINICAL DATA:  Right-sided pneumothorax with chest tube treatment, lung malignancy, Celsius OPD  EXAM: PORTABLE CHEST 1 VIEW  COMPARISON:  Portable chest x-ray of August 06, 2015  FINDINGS: A persistent right apical and subpulmonic pneumothorax is demonstrated. There are 2 chest tubes in place today. The medial chest tube is stable. The lower chest tube is new and and upper chest tube has been removed. There is density within the lung parenchyma are just above the right hemidiaphragm which is stable. The left lung is clear. There is no significant mediastinal shift. The cardiac silhouette remains enlarged. The pulmonary vascularity is normal. The bony thorax exhibits no acute abnormality.  IMPRESSION: There is a persistent approximately 15-20% right-sided pneumothorax in the apex and subpulmonic region. Two right chest tubes are present with the tip of the medial chest tube being most superiorly positioned just inferior to the eighth rib. Stable parenchymal density at the right lung base.   Electronically Signed  By: David  Martinique M.D.   On: 08/08/2015 07:27   Chest tube- + air leak Assessment/Plan: S/P Procedure(s) (LRB): VIDEO BRONCHOSCOPY WITH ATTEMPTED INSERTION OF INTERBRONCHIAL VALVE (IBV) WITH FLUROSCOPY (N/A) INSERTION OF SECOND RIGHT CHEST TUBE WITH FLUROSCOPY (Right)  1 change to 10 cm H2O suction per EBG    LOS: 11 days    GOLD,[name] 08/08/2015  Stable chest xray still with air leak fom one tube , suction decreased today I have seen and examined Gabriel [name] and agree with the above assessment  and plan.  [name] Beeper [phone] Office [phone] 08/08/2015 7:41 PM

## 2015-08-08 NOTE — Telephone Encounter (Signed)
Appointments for lab corrected.  Currently admitted.

## 2015-08-08 NOTE — Progress Notes (Signed)
Utilization review completed.  

## 2015-08-08 NOTE — Progress Notes (Signed)
[facility] PROGRESS NOTE  Gabriel [name] RJJ:[id number] DOB: 1941/10/01 DOA: 07/28/2015 PCP: No PCP Per Patient  Assessment/Plan: #1 recurrent pneumothorax Questionable etiology. May be secondary to history of lung cancer, COPD and ongoing tobacco abuse. Patient status post video bronchoscopy with attempted insertion of intrabronchial valve with fluoroscopy, insertion of second right chest tube 08/02/2015. Chest x-ray on the morning of 08/04/2015 with worsening pneumothorax. Chest tube was placed back to suction per CT surgery with repeat chest x-ray from today with persistent 15-20% right-sided pneumothorax in the apex and subpulmonic region. 2 right chest user present with the tip of the medial chest tube being mostly superior position just inferior to the A3. Stable parenchymal density at the right lung base.  Pain management. Per cardiothoracic surgery.  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 Earlie Server of oncology. Patient status post treatment with chemotherapy and radiation therapy, carboplatin and paclitaxel with partial response. Oncology plans to initiate therapy with carboplatin and gemcitabine on 08/08/2015, which will need to be rescheduled. Outpatient follow-up with oncology.  #3 COPD with acute exacerbation Patient with wheezing noted on examination and right-sided rhonchi complaining of shortness of breath 3 days ago. Clinical improvement.. Chest x-ray repeat shows improved pneumothorax after chest tubes have been placed back on suction with persistent right basilar opacity consistent with atelectasis or infiltrate. Patient also noted to have a leukocytosis which is trending down. Continue Xopenex and Atrovent scheduled nebulizers, Symbicort,  Levaquin. Change IV Solu-Medrol to oral prednisone taper.  #4 probable HCAP Repeat chest x-ray with persistent right basilar opacity noted consistent with atelectasis or infiltrate. Patient withdrawn process breath  sounds and patient states feeling congested. Patient also noted to be wheezing 3 days ago. Clinical improvement. Urine Legionella antigen and urine pneumococcus antigen pending. Sputum Gram stain and culture negative. Patient with history of lung cancer and as such is immunocompromised with a pneumothorax. Continue scheduled nebs, Mucinex, flutter valve. Change IV Levaquin to oral Levaquin. Follow.  #5 leukocytosis Likely secondary to problem #4. Leukocytosis trending down. Sputum Gram stain and culture negative. Urine Legionella antigen and pneumococcus antigen pending. Continue empiric Levaquin.   #6 prophylaxis Lovenox for DVT prophylaxis.    Code Status: Full Family Communication: Updated patient and family at bedside.  Disposition Plan: Per CVTS   Consultants:  CVTS: Dr Servando Snare 07/28/2015  Procedures:  Video bronchoscopy with attempted insertion of intrabronchial valve with fluoroscopy insertion of second right chest tube with fluoroscopy 08/02/2015 per Dr. Servando Snare   chest x-ray 08/02/2015, 08/03/2015, 08/04/2015     Antibiotics:  IV Levaquin 08/04/2015  HPI/Subjective: Patient states shortness of breath has improved. Patient feeling better.No CP.  Objective: Filed Vitals:   08/08/15 0454  BP: 120/72  Pulse: 79  Temp: 98.1 F (36.7 C)  Resp: 18    Intake/Output Summary (Last 24 hours) at 08/08/15 1342 Last data filed at 08/08/15 0800  Gross per 24 hour  Intake   1560 ml  Output   1971 ml  Net   -411 ml   Filed Weights   07/28/15 0652 07/28/15 1039  Weight: 61.236 kg (135 lb) 60.1 kg (132 lb 7.9 oz)    Exam:   General:  NAD  Cardiovascular: RRR  Respiratory: Less Rhonchorous breath sounds on the right greater than left. Less Inspiratory and expiratory wheezing  Abdomen: Soft, nontender, nondistended, positive bowel sounds.   Musculoskeletal:  no clubbing cyanosis or edema.   Data Reviewed: Basic Metabolic Panel:  Recent Labs Lab  08/04/15  6578 08/05/15 0359 08/06/15 0414 08/07/15 0335 08/08/15 0422  NA 135 131* 136 137 137  K 4.0 4.2 4.1 4.4 4.0  CL 100* 96* 100* 100* 101  CO2 '27 26 26 27 26  '$ GLUCOSE 119* 153* 150* 133* 101*  BUN 14 14 22* 19 16  CREATININE 0.89 0.81 0.93 0.83 0.83  CALCIUM 9.1 9.3 9.4 9.3 9.1  MG  --   --   --   --  1.7   Liver Function Tests:  Recent Labs Lab 08/01/15 2044  AST 21  ALT 19  ALKPHOS 79  BILITOT 0.3  PROT 6.1*  ALBUMIN 3.3*   No results for input(s): LIPASE, AMYLASE in the last 168 hours. No results for input(s): AMMONIA in the last 168 hours. CBC:  Recent Labs Lab 08/04/15 1316 08/05/15 0359 08/06/15 0414 08/07/15 0335 08/08/15 0422  WBC 14.1* 10.6* 13.3* 10.6* 8.1  NEUTROABS 11.6* 10.0*  --   --   --   HGB 13.8 12.8* 11.7* 11.7* 11.9*  HCT 41.4 38.2* 34.0* 35.4* 35.7*  MCV 92.0 92.3 92.1 93.2 92.2  PLT 187 177 174 188 189   Cardiac Enzymes: No results for input(s): CKTOTAL, CKMB, CKMBINDEX, TROPONINI in the last 168 hours. BNP (last 3 results)  Recent Labs  04/04/15 1831 07/28/15 0720  BNP 53.8 42.7    ProBNP (last 3 results) No results for input(s): PROBNP in the last 8760 hours.  CBG: No results for input(s): GLUCAP in the last 168 hours.  Recent Results (from the past 240 hour(s))  Surgical pcr screen     Status: None   Collection Time: 08/01/15 11:47 PM  Result Value Ref Range Status   MRSA, PCR NEGATIVE NEGATIVE Final   Staphylococcus aureus NEGATIVE NEGATIVE Final    Comment:        The Xpert SA Assay (FDA approved for NASAL specimens in patients over 24 years of age), is one component of a comprehensive surveillance program.  Test performance has been validated by [facility] for patients greater than or equal to 63 year old. It is not intended to diagnose infection nor to guide or monitor treatment.   Culture, respiratory (NON-Expectorated)     Status: None   Collection Time: 08/02/15  8:26 AM  Result Value Ref Range  Status   Specimen Description BRONCHIAL WASHINGS RIGHT  Final   Special Requests NONE  Final   Gram Stain   Final    MODERATE WBC PRESENT, PREDOMINANTLY PMN RARE SQUAMOUS EPITHELIAL CELLS PRESENT FEW GRAM POSITIVE COCCI IN PAIRS FEW GRAM NEGATIVE COCCOBACILLI Performed at [facility]    Culture   Final    Non-Pathogenic Oropharyngeal-type Flora Isolated. Performed at [facility]    Report Status 08/05/2015 FINAL  Final  Gram stain     Status: None   Collection Time: 08/02/15  8:26 AM  Result Value Ref Range Status   Specimen Description BRONCHIAL WASHINGS  Final   Special Requests NONE  Final   Gram Stain   Final    MODERATE WBC PRESENT, PREDOMINANTLY PMN FEW GRAM POSITIVE COCCI IN PAIRS FEW GRAM NEGATIVE COCCOBACILLI RARE GRAM POSITIVE RODS    Report Status 08/02/2015 FINAL  Final  Culture, blood (routine x 2) Call MD if unable to obtain prior to antibiotics being given     Status: None (Preliminary result)   Collection Time: 08/04/15  4:00 PM  Result Value Ref Range Status   Specimen Description BLOOD RIGHT ANTECUBITAL  Final  Special Requests BOTTLES DRAWN AEROBIC ONLY 10CC  Final   Culture NO GROWTH 4 DAYS  Final   Report Status PENDING  Incomplete  Culture, blood (routine x 2) Call MD if unable to obtain prior to antibiotics being given     Status: None (Preliminary result)   Collection Time: 08/04/15  4:18 PM  Result Value Ref Range Status   Specimen Description BLOOD LEFT ANTECUBITAL  Final   Special Requests BOTTLES DRAWN AEROBIC AND ANAEROBIC 10CC  Final   Culture NO GROWTH 4 DAYS  Final   Report Status PENDING  Incomplete     Studies: Dg Chest Port 1 View  08/08/2015   CLINICAL DATA:  Right-sided pneumothorax with chest tube treatment, lung malignancy, Celsius OPD  EXAM: PORTABLE CHEST 1 VIEW  COMPARISON:  Portable chest x-ray of August 06, 2015  FINDINGS: A persistent right apical and subpulmonic pneumothorax is demonstrated. There are 2  chest tubes in place today. The medial chest tube is stable. The lower chest tube is new and and upper chest tube has been removed. There is density within the lung parenchyma are just above the right hemidiaphragm which is stable. The left lung is clear. There is no significant mediastinal shift. The cardiac silhouette remains enlarged. The pulmonary vascularity is normal. The bony thorax exhibits no acute abnormality.  IMPRESSION: There is a persistent approximately 15-20% right-sided pneumothorax in the apex and subpulmonic region. Two right chest tubes are present with the tip of the medial chest tube being most superiorly positioned just inferior to the eighth rib. Stable parenchymal density at the right lung base.   Electronically Signed   By: David  Martinique M.D.   On: 08/08/2015 07:27    Scheduled Meds: . budesonide-formoterol  2 puff Inhalation BID  . enoxaparin (LOVENOX) injection  40 mg Subcutaneous Q24H  . guaiFENesin  1,200 mg Oral BID  . ipratropium  0.5 mg Nebulization QID  . levalbuterol  0.63 mg Nebulization QID  . levofloxacin  750 mg Oral Daily  . polyethylene glycol  17 g Oral Daily  . predniSONE  60 mg Oral Q breakfast  . sodium chloride  3 mL Intravenous Q12H   Continuous Infusions:   Principal Problem:   Pneumothorax Active Problems:   Pneumothorax on right   Non-small cell carcinoma of right lung, stage 3 (HCC)   COPD (chronic obstructive pulmonary disease) (HCC)   Lung cancer (HCC)   HCAP (healthcare-associated pneumonia)   COPD exacerbation (HCC)   Chest tube in place   Pneumothorax, right    Time spent: 91 minutes    Glory Graefe M.D. [facility] Pager [phone]. If 7PM-7AM, please contact night-coverage at [email or web address], password [facility] 08/08/2015, 1:42 PM  LOS: 11 days

## 2015-08-08 NOTE — Progress Notes (Signed)
After clarification of chest tube order with PA Erin; pt right anterior wall chest tube left to water seal and pt second right tube decreased to 10cm suction as ordered. Will closely monitor pt quietly. P Amo Raysean Graumann RN.

## 2015-08-08 NOTE — Progress Notes (Signed)
Both chest tubes did not put out any drainage this shift. Will continue to monitor.

## 2015-08-09 ENCOUNTER — Inpatient Hospital Stay (HOSPITAL_COMMUNITY): Payer: 59

## 2015-08-09 LAB — BASIC METABOLIC PANEL
Anion gap: 7 (ref 5–15)
BUN: 16 mg/dL (ref 6–20)
CHLORIDE: 100 mmol/L — AB (ref 101–111)
CO2: 29 mmol/L (ref 22–32)
CREATININE: 0.81 mg/dL (ref 0.61–1.24)
Calcium: 9 mg/dL (ref 8.9–10.3)
GFR calc Af Amer: >60 mL/min (ref >60)
GFR calc non Af Amer: >60 mL/min (ref >60)
Glucose, Bld: 85 mg/dL (ref 65–99)
POTASSIUM: 3.6 mmol/L (ref 3.5–5.1)
SODIUM: 136 mmol/L (ref 135–145)

## 2015-08-09 LAB — CULTURE, BLOOD (ROUTINE X 2)
Culture: NO GROWTH
Culture: NO GROWTH

## 2015-08-09 MED ORDER — PREDNISONE 20 MG PO TABS
40.0000 mg | ORAL_TABLET | Freq: Every day | ORAL | Status: DC
  Administered 2015-08-10 – 2015-08-11 (×2): 40 mg via ORAL
  Filled 2015-08-09 (×2): qty 2

## 2015-08-09 NOTE — Progress Notes (Signed)
[facility] PROGRESS NOTE  Gabriel [name] BMW:[id number] DOB: [birth year] DOA: 07/28/2015 PCP: No PCP Per Patient  Brief interval summary HPI: Gabriel [name] is a 74 y.o. 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 He was last seen by Gabriel. Julien [name] on 07/25/2015 with treatment options were discussed at the time, he expects to start treatment with carboplatin and gemcitabine on 08/08/2015. Gabriel [name] has a history of a right-sided pneumothorax diagnosed on 04/04/2015 undergoing chest tube placement. He presented to the emergency room today with complaints of severe dyspnea on exertion. He reported getting out of bed on the day of admission, having significant shortness of breath and intolerance to physical exertion. He denied fevers, chills, wheezing, cough, sputum production, falls, chest pain or recent trauma. On x-ray performed in the emergency department revealed a right-sided pneumothorax. Gabriel Gabriel [name] of cardiothoracic surgery was consulted as a chest tube placement was performed in the emergency department. Repeat chest x-ray showed significantly improved right-sided pneumothorax  Patient was admitted and subsequently transferred to [facility] where he was followed by cardiothoracic surgery. Cardiothoracic surgery performed video bronchoscopy with attempted insertion of intrabronchial valve with fluoroscopy with insertion of second right chest tube with fluoroscopy on the right. Suction was discontinued however no significant increase in pneumothorax and chest tubes were placed back to suction. Cardiothoracic surgery considering a talc pleurodesis when lung is up.  Patient during this time developed some worsening shortness of breath and wheezing and noted to have a leukocytosis. Chest x-ray  obtained was worrisome for pneumonia. Patient was placed empirically on antibiotics as well as nebulizer treatments and Mucinex for probable acute COPD exacerbation and a healthcare associated pneumonia. Patient improved clinically.        Assessment/Plan: #1 recurrent pneumothorax Questionable etiology. May be secondary to history of lung cancer, COPD and ongoing tobacco abuse. Patient status post video bronchoscopy with attempted insertion of intrabronchial valve with fluoroscopy, insertion of second right chest tube 08/02/2015. Chest x-ray on the morning of 08/04/2015 with worsening pneumothorax. Chest tube was placed back to suction per CT surgery with repeat chest x-ray from today with persistent 15-20% right-sided pneumothorax in the apex and subpulmonic region. 2 right chest tubes present with the tip of the medial chest tube being mostly superior position just inferior to the A3. Stable parenchymal density at the right lung base.  Pain management. Per cardiothoracic surgery.  #2 stage III non-small cell lung cancer Being followed by Gabriel. Earlie [name] of oncology. Patient status post treatment with chemotherapy and radiation therapy, carboplatin and paclitaxel with partial response. Oncology plans to initiate therapy with carboplatin and gemcitabine on 08/08/2015, which will need to be rescheduled. Outpatient follow-up with oncology.  #3 COPD with acute exacerbation Patient with wheezing noted on examination and right-sided rhonchi complaining of shortness of breath 4 days ago. Clinical improvement.. Chest x-ray repeat shows improved pneumothorax after chest tubes have been placed back on suction with persistent right basilar opacity consistent with atelectasis or infiltrate. Patient [name] noted to have a leukocytosis which is trending down. Continue Xopenex and Atrovent scheduled nebulizers, Symbicort,  Levaquin, oral prednisone taper.  #4 probable HCAP Repeat chest x-ray with persistent right  basilar opacity noted consistent with atelectasis or infiltrate. Patient noted to have some rhonchorous breath sounds and wheezing 4 days ago. Clinical improvement. Urine Legionella antigen and urine  pneumococcus antigen pending. Sputum Gram stain and culture negative. Patient with history of lung cancer and as such is immunocompromised with a pneumothorax. Continue scheduled nebs, Mucinex, flutter valve. Continue oral Levaquin antibiotic day 5/7.  #5 leukocytosis Likely secondary to problem #4. Leukocytosis trending down. Sputum Gram stain and culture negative. Urine Legionella antigen and pneumococcus antigen pending. Continue empiric Levaquin.   #6 prophylaxis Lovenox for DVT prophylaxis.    Code Status: Full Family Communication: Updated patient and family at bedside.  Disposition Plan: Per CVTS   Consultants:  CVTS: Gabriel [name] 07/28/2015  Procedures:  Video bronchoscopy with attempted insertion of intrabronchial valve with fluoroscopy insertion of second right chest tube with fluoroscopy 08/02/2015 per Gabriel. Servando [name]   chest x-ray 08/02/2015, 08/03/2015, 08/04/2015     Antibiotics:  IV Levaquin 08/04/2015  Oral Levaquin  HPI/Subjective: Patient states shortness of breath better. No CP. Feeling better.  Objective: Filed Vitals:   08/09/15 0415  BP: 115/67  Pulse: 74  Temp: 97.9 F (36.6 C)  Resp: 18    Intake/Output Summary (Last 24 hours) at 08/09/15 0915 Last data filed at 08/09/15 0900  Gross per 24 hour  Intake    720 ml  Output    410 ml  Net    310 ml   Filed Weights   07/28/15 0652 07/28/15 1039  Weight: 61.236 kg (135 lb) 60.1 kg (132 lb 7.9 oz)    Exam:   General:  NAD  Cardiovascular: RRR  Respiratory: Less Rhonchorous breath sounds on the right greater than left. Less Inspiratory and expiratory wheezing  Abdomen: Soft, nontender, nondistended, positive bowel sounds.   Musculoskeletal:  no clubbing cyanosis or edema.   Data  Reviewed: Basic Metabolic Panel:  Recent Labs Lab 08/05/15 0359 08/06/15 0414 08/07/15 0335 08/08/15 0422 08/09/15 0506  NA 131* 136 137 137 136  K 4.2 4.1 4.4 4.0 3.6  CL 96* 100* 100* 101 100*  CO2 '26 26 27 26 29  '$ GLUCOSE 153* 150* 133* 101* 85  BUN 14 22* '19 16 16  '$ CREATININE 0.81 0.93 0.83 0.83 0.81  CALCIUM 9.3 9.4 9.3 9.1 9.0  MG  --   --   --  1.7  --    Liver Function Tests: No results for input(s): AST, ALT, ALKPHOS, BILITOT, PROT, ALBUMIN in the last 168 hours. No results for input(s): LIPASE, AMYLASE in the last 168 hours. No results for input(s): AMMONIA in the last 168 hours. CBC:  Recent Labs Lab 08/04/15 1316 08/05/15 0359 08/06/15 0414 08/07/15 0335 08/08/15 0422  WBC 14.1* 10.6* 13.3* 10.6* 8.1  NEUTROABS 11.6* 10.0*  --   --   --   HGB 13.8 12.8* 11.7* 11.7* 11.9*  HCT 41.4 38.2* 34.0* 35.4* 35.7*  MCV 92.0 92.3 92.1 93.2 92.2  PLT 187 177 174 188 189   Cardiac Enzymes: No results for input(s): CKTOTAL, CKMB, CKMBINDEX, TROPONINI in the last 168 hours. BNP (last 3 results)  Recent Labs  04/04/15 1831 07/28/15 0720  BNP 53.8 42.7    ProBNP (last 3 results) No results for input(s): PROBNP in the last 8760 hours.  CBG: No results for input(s): GLUCAP in the last 168 hours.  Recent Results (from the past 240 hour(s))  Surgical pcr screen     Status: None   Collection Time: 08/01/15 11:47 PM  Result Value Ref Range Status   MRSA, PCR NEGATIVE NEGATIVE Final   Staphylococcus aureus NEGATIVE NEGATIVE Final    Comment:  The Xpert SA Assay (FDA approved for NASAL specimens in patients over 50 years of age), is one component of a comprehensive surveillance program.  Test performance has been validated by [facility] for patients greater than or equal to 38 year old. It is not intended to diagnose infection nor to guide or monitor treatment.   Culture, respiratory (NON-Expectorated)     Status: None   Collection Time: 08/02/15   8:26 AM  Result Value Ref Range Status   Specimen Description BRONCHIAL WASHINGS RIGHT  Final   Special Requests NONE  Final   Gram Stain   Final    MODERATE WBC PRESENT, PREDOMINANTLY PMN RARE SQUAMOUS EPITHELIAL CELLS PRESENT FEW GRAM POSITIVE COCCI IN PAIRS FEW GRAM NEGATIVE COCCOBACILLI Performed at [facility]    Culture   Final    Non-Pathogenic Oropharyngeal-type Flora Isolated. Performed at [facility]    Report Status 08/05/2015 FINAL  Final  Gram stain     Status: None   Collection Time: 08/02/15  8:26 AM  Result Value Ref Range Status   Specimen Description BRONCHIAL WASHINGS  Final   Special Requests NONE  Final   Gram Stain   Final    MODERATE WBC PRESENT, PREDOMINANTLY PMN FEW GRAM POSITIVE COCCI IN PAIRS FEW GRAM NEGATIVE COCCOBACILLI RARE GRAM POSITIVE RODS    Report Status 08/02/2015 FINAL  Final  Culture, blood (routine x 2) Call MD if unable to obtain prior to antibiotics being given     Status: None (Preliminary result)   Collection Time: 08/04/15  4:00 PM  Result Value Ref Range Status   Specimen Description BLOOD RIGHT ANTECUBITAL  Final   Special Requests BOTTLES DRAWN AEROBIC ONLY 10CC  Final   Culture NO GROWTH 4 DAYS  Final   Report Status PENDING  Incomplete  Culture, blood (routine x 2) Call MD if unable to obtain prior to antibiotics being given     Status: None (Preliminary result)   Collection Time: 08/04/15  4:18 PM  Result Value Ref Range Status   Specimen Description BLOOD LEFT ANTECUBITAL  Final   Special Requests BOTTLES DRAWN AEROBIC AND ANAEROBIC 10CC  Final   Culture NO GROWTH 4 DAYS  Final   Report Status PENDING  Incomplete     Studies: Dg Chest Port 1 View  08/09/2015   CLINICAL DATA:  Followup right pneumothorax with chest tubes in place.  EXAM: PORTABLE CHEST 1 VIEW  COMPARISON:  08/08/2015 and earlier.  FINDINGS: 2 right chest tubes in place with a residual right apical, medial and basilar pneumothorax on the  order of 15-20% or so, unchanged. 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 Atelectasis at the right lung base, increased since yesterday. Left lung remains clear. Cardiac silhouette enlarged, unchanged. Pulmonary vascularity normal.  IMPRESSION: 1. No change in the approximate 15-20% or so right pneumothorax with chest tubes in place. 2. Increasing right basilar atelectasis.   Electronically Signed   By: Evangeline Dakin M.D.   On: 08/09/2015 08:14   Dg Chest Port 1 View  08/08/2015   CLINICAL DATA:  Right-sided pneumothorax with chest tube treatment, lung malignancy, Celsius OPD  EXAM: PORTABLE CHEST 1 VIEW  COMPARISON:  Portable chest x-ray of August 06, 2015  FINDINGS: A persistent right apical and subpulmonic pneumothorax is demonstrated. There are 2 chest tubes in place today. The medial chest tube is stable. The lower chest tube is new and and upper chest tube has been  removed. There is density within the lung parenchyma are just above the right hemidiaphragm which is stable. The left lung is clear. There is no significant mediastinal shift. The cardiac silhouette remains enlarged. The pulmonary vascularity is normal. The bony thorax exhibits no acute abnormality.  IMPRESSION: There is a persistent approximately 15-20% right-sided pneumothorax in the apex and subpulmonic region. Two right chest tubes are present with the tip of the medial chest tube being most superiorly positioned just inferior to the eighth rib. Stable parenchymal density at the right lung base.   Electronically Signed   By: David  Martinique M.D.   On: 08/08/2015 07:27    Scheduled Meds: . budesonide-formoterol  2 puff Inhalation BID  . enoxaparin (LOVENOX) injection  40 mg Subcutaneous Q24H  . guaiFENesin  1,200 mg Oral BID  . ipratropium  0.5 mg Nebulization QID  . levalbuterol  0.63 mg Nebulization QID  . levofloxacin  750 mg Oral Daily  . polyethylene glycol  17 g Oral Daily  .  predniSONE  60 mg Oral Q breakfast  . sodium chloride  3 mL Intravenous Q12H   Continuous Infusions:   Principal Problem:   Pneumothorax Active Problems:   Pneumothorax on right   Non-small cell carcinoma of right lung, stage 3 (HCC)   COPD (chronic obstructive pulmonary disease) (HCC)   Lung cancer (HCC)   HCAP (healthcare-associated pneumonia)   COPD exacerbation (HCC)   Chest tube in place   Pneumothorax, right    Time spent: 60 minutes    Linda Grimmer M.D. [facility] Pager [phone]. If 7PM-7AM, please contact night-coverage at [email or web address], password [email or web address] 08/09/2015, 9:15 AM  LOS: 12 days

## 2015-08-09 NOTE — Progress Notes (Addendum)
       [address]       [place] [address]             [phone]          7 Days Post-Op Procedure(s) (LRB): VIDEO BRONCHOSCOPY WITH ATTEMPTED INSERTION OF INTERBRONCHIAL VALVE (IBV) WITH FLUROSCOPY (N/A) INSERTION OF SECOND RIGHT CHEST TUBE WITH FLUROSCOPY (Right)  Subjective: Walking around in room, breathing comfortably. No complaints.   Objective: Vital signs in last 24 hours: Patient Vitals for the past 24 hrs:  BP Temp Temp src Pulse Resp SpO2  08/09/15 0754 - - - - - 98 %  08/09/15 0415 115/67 mmHg 97.9 F (36.6 C) Oral 74 18 98 %  08/08/15 2033 - - - - - 98 %  08/08/15 2026 (!) 148/89 mmHg 97.6 F (36.4 C) Oral 92 18 98 %  08/08/15 1459 126/73 mmHg 97.8 F (36.6 C) Oral 90 18 96 %  08/08/15 1334 - - - - - 97 %   Current Weight  07/28/15 132 lb 7.9 oz (60.1 kg)     Intake/Output from previous day: 10/10 0701 - 10/11 0700 In: 720 [P.O.:720] Out: 410 [Urine:400; Chest Tube:10]    PHYSICAL EXAM:  Heart: RRR Lungs: Clear, slightly decreased BS on R Wound: Clean and dry Chest tube: 3-4 air leak with cough from posterior tube, small 1/7 leak from anterior tube   Lab Results: CBC: Recent Labs  08/07/15 0335 08/08/15 0422  WBC 10.6* 8.1  HGB 11.7* 11.9*  HCT 35.4* 35.7*  PLT 188 189   BMET:  Recent Labs  08/08/15 0422 08/09/15 0506  NA 137 136  K 4.0 3.6  CL 101 100*  CO2 26 29  GLUCOSE 101* 85  BUN 16 16  CREATININE 0.83 0.81  CALCIUM 9.1 9.0    PT/INR: No results for input(s): LABPROT, INR in the last 72 hours.  CXR; FINDINGS: 2 right chest tubes in place with a residual right apical, medial and basilar pneumothorax on the order of 15-20% or so, unchanged. 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 Atelectasis at the right lung base, increased since yesterday. Left lung remains clear. Cardiac silhouette enlarged, unchanged. Pulmonary vascularity normal.  IMPRESSION: 1.  No change in the approximate 15-20% or so right pneumothorax with chest tubes in place. 2. Increasing right basilar atelectasis.   Assessment/Plan: S/P Procedure(s) (LRB): VIDEO BRONCHOSCOPY WITH ATTEMPTED INSERTION OF INTERBRONCHIAL VALVE (IBV) WITH FLUROSCOPY (N/A) INSERTION OF SECOND RIGHT CHEST TUBE WITH FLUROSCOPY (Right) Persistent air leak . CXR with stable pneumothorax. Continue current care for now.   LOS: 12 days    COLLINS,GINA H 08/09/2015  Mostly basilar ptx, ct now to water seal, both, both tubes leak with cough I have seen and examined Gabriel [name] and agree with the above assessment  and plan.  [name] Beeper [phone] Office [phone] 08/09/2015 6:28 PM

## 2015-08-09 NOTE — Progress Notes (Signed)
Patient sitting on side of bed.  No distress, son is present at bedside.  Call light within reach.

## 2015-08-10 ENCOUNTER — Inpatient Hospital Stay (HOSPITAL_COMMUNITY): Payer: 59

## 2015-08-10 LAB — BASIC METABOLIC PANEL
Anion gap: 11 (ref 5–15)
BUN: 15 mg/dL (ref 6–20)
CHLORIDE: 98 mmol/L — AB (ref 101–111)
CO2: 29 mmol/L (ref 22–32)
CREATININE: 0.94 mg/dL (ref 0.61–1.24)
Calcium: 9.2 mg/dL (ref 8.9–10.3)
GFR calc non Af Amer: >60 mL/min (ref >60)
Glucose, Bld: 81 mg/dL (ref 65–99)
Potassium: 3.8 mmol/L (ref 3.5–5.1)
Sodium: 138 mmol/L (ref 135–145)

## 2015-08-10 LAB — CBC
HEMATOCRIT: 36.4 % — AB (ref 39.0–52.0)
HEMOGLOBIN: 11.9 g/dL — AB (ref 13.0–17.0)
MCH: 30.1 pg (ref 26.0–34.0)
MCHC: 32.7 g/dL (ref 30.0–36.0)
MCV: 92.2 fL (ref 78.0–100.0)
Platelets: 212 10*3/uL (ref 150–400)
RBC: 3.95 MIL/uL — ABNORMAL LOW (ref 4.22–5.81)
RDW: 15.9 % — ABNORMAL HIGH (ref 11.5–15.5)
WBC: 8.7 10*3/uL (ref 4.0–10.5)

## 2015-08-10 MED ORDER — POTASSIUM CHLORIDE CRYS ER 20 MEQ PO TBCR
20.0000 meq | EXTENDED_RELEASE_TABLET | Freq: Once | ORAL | Status: AC
  Administered 2015-08-10: 20 meq via ORAL
  Filled 2015-08-10: qty 1

## 2015-08-10 NOTE — Progress Notes (Signed)
Patient alert watching tv. No pain or distress at this time.  Call light within reach.

## 2015-08-10 NOTE — Progress Notes (Signed)
Patient states that he is having some discomfort in the sacrum area.  Did not want the foam dressing at this time.  Wanted to try repositioning with a pillow at this time.  Advised him that if he does not get any relief to let the nurse know so that further steps can be taken. Area is slightly red, no broken skin.  States that he slept in one spot all night.

## 2015-08-10 NOTE — Progress Notes (Addendum)
      [address]       [place] [address]             [phone]       8 Days Post-Op Procedure(s) (LRB): VIDEO BRONCHOSCOPY WITH ATTEMPTED INSERTION OF INTERBRONCHIAL VALVE (IBV) WITH FLUROSCOPY (N/A) INSERTION OF SECOND RIGHT CHEST TUBE WITH FLUROSCOPY (Right)  Subjective: Patient had some burning at chest tube site. He states his breathing is "fairly good".   Objective: Vital signs in last 24 hours: Temp:  [97.8 F (36.6 C)-98.3 F (36.8 C)] 97.8 F (36.6 C) (10/12 0412) Pulse Rate:  [79-106] 79 (10/12 0412) Cardiac Rhythm:  [-] Normal sinus rhythm (10/12 0708) Resp:  [16-18] 18 (10/12 0412) BP: (105-147)/(71-77) 105/71 mmHg (10/12 0412) SpO2:  [97 %-99 %] 98 % (10/12 0412)     Intake/Output from previous day: 10/11 0701 - 10/12 0700 In: 483 [P.O.:480; I.V.:3] Out: 1020 [Urine:1020]   Physical Exam:  Cardiovascular: RRR Pulmonary: Mostly clear to auscultation bilaterally Abdomen: Soft, non tender, bowel sounds present. Wounds: Clean and dry.  No erythema or signs of infection. Chest Tubes:to water seal with air leak in both  Lab Results: CBC: Recent Labs  08/08/15 0422 08/10/15 0429  WBC 8.1 8.7  HGB 11.9* 11.9*  HCT 35.7* 36.4*  PLT 189 212   BMET:  Recent Labs  08/09/15 0506 08/10/15 0429  NA 136 138  K 3.6 3.8  CL 100* 98*  CO2 29 29  GLUCOSE 85 81  BUN 16 15  CREATININE 0.81 0.94  CALCIUM 9.0 9.2    PT/INR: No results for input(s): LABPROT, INR in the last 72 hours. ABG:  INR: Will add last result for INR, ABG once components are confirmed Will add last 4 CBG results once components are confirmed  Assessment/Plan:  1. CV - SR in the 70's. 2.  Pulmonary - 2 chest tubes with minor output. There is an air leak in both that worsens with cough. CXR not taken yet. Continue both chest tubes to water seal for now. On 2 liters of oxygen via [place]. Encourage incentive spirometer. 3. H and H stable at 11.9 and 36.4 4.  Supplement potassium 5.ID-On Levaquin for probable CAP  ZIMMERMAN,Gabriel [name] 08/10/2015,7:59 AM  Basilar ptx evident slightly larger off suction, will echeck in am, consider d/c one chest tube an d/c home with one tube. I have seen and examined Gabriel [name] and agree with the above assessment  and plan.  Gabriel Isaac MD Beeper [phone] Office [phone] 08/10/2015 10:23 PM

## 2015-08-10 NOTE — Progress Notes (Signed)
[facility] PROGRESS NOTE  Gabriel [name] MWU:[id number] DOB: 1941/01/25 DOA: 07/28/2015 PCP: No PCP Per Patient  Assessment/Plan: recurrent pneumothorax  - May be secondary to history of lung cancer, COPD and ongoing tobacco abuse.  -  status post video bronchoscopy with attempted insertion of intrabronchial valve with fluoroscopy, insertion of second right chest tube 08/02/2015. - Management per CT surgery  stage III non-small cell lung cancer Being followed by Dr. Earlie Server of oncology. Patient status post treatment with chemotherapy and radiation therapy, carboplatin and paclitaxel with partial response. Oncology plans to initiate therapy with carboplatin and gemcitabine on 08/08/2015, which will need to be rescheduled. Outpatient follow-up with oncology.   COPD with acute exacerbation -  Continue Xopenex and Atrovent scheduled nebulizers, Symbicort,  Levaquin. IV steroids changed to oral prednisone.  probable HCAP Repeat chest x-ray with persistent right basilar opacity noted consistent with atelectasis or infiltrate. Patient withdrawn process breath sounds and patient states feeling congested. Patient also noted to be wheezing 3 days ago. Clinical improvement. Urine Legionella antigen and urine pneumococcus antigen pending. Sputum Gram stain and culture negative. Patient with history of lung cancer and as such is immunocompromised with a pneumothorax. Continue scheduled nebs, Mucinex, flutter valve. Change IV Levaquin to oral Levaquin. Follow.  leukocytosis Likely secondary to problem #4. Leukocytosis trending down. Sputum Gram stain and culture negative. Urine Legionella antigen and pneumococcus antigen pending. Continue empiric Levaquin.    prophylaxis Lovenox for DVT prophylaxis.    Code Status: Full Family Communication: Updated patient and family at bedside.  Disposition Plan: Per CVTS   Consultants:  CVTS: Dr Servando Snare 07/28/2015  Procedures:  Video bronchoscopy  with attempted insertion of intrabronchial valve with fluoroscopy insertion of second right chest tube with fluoroscopy 08/02/2015 per Dr. Servando Snare   chest x-ray 08/02/2015, 08/03/2015, 08/04/2015     Antibiotics:  IV Levaquin 08/04/2015  HPI/Subjective: Patient states shortness of breath has improved. Patient feeling better.No CP.  Objective: Filed Vitals:   08/10/15 0412  BP: 105/71  Pulse: 79  Temp: 97.8 F (36.6 C)  Resp: 18    Intake/Output Summary (Last 24 hours) at 08/10/15 1302 Last data filed at 08/10/15 0730  Gross per 24 hour  Intake    483 ml  Output   1470 ml  Net   -987 ml   Filed Weights   07/28/15 0652 07/28/15 1039  Weight: 61.236 kg (135 lb) 60.1 kg (132 lb 7.9 oz)    Exam:   General:  NAD  Cardiovascular: RRR  Respiratory: Less Rhonchorous breath sounds on the right greater than left. No wheezing  Abdomen: Soft, nontender, nondistended, positive bowel sounds.   Musculoskeletal:  no clubbing cyanosis or edema.   Data Reviewed: Basic Metabolic Panel:  Recent Labs Lab 08/06/15 0414 08/07/15 0335 08/08/15 0422 08/09/15 0506 08/10/15 0429  NA 136 137 137 136 138  K 4.1 4.4 4.0 3.6 3.8  CL 100* 100* 101 100* 98*  CO2 '26 27 26 29 29  '$ GLUCOSE 150* 133* 101* 85 81  BUN 22* '19 16 16 15  '$ CREATININE 0.93 0.83 0.83 0.81 0.94  CALCIUM 9.4 9.3 9.1 9.0 9.2  MG  --   --  1.7  --   --    Liver Function Tests: No results for input(s): AST, ALT, ALKPHOS, BILITOT, PROT, ALBUMIN in the last 168 hours. No results for input(s): LIPASE, AMYLASE in the last 168 hours. No results for input(s): AMMONIA in the last 168 hours. CBC:  Recent Labs Lab 08/04/15  1316 08/05/15 0359 08/06/15 0414 08/07/15 0335 08/08/15 0422 08/10/15 0429  WBC 14.1* 10.6* 13.3* 10.6* 8.1 8.7  NEUTROABS 11.6* 10.0*  --   --   --   --   HGB 13.8 12.8* 11.7* 11.7* 11.9* 11.9*  HCT 41.4 38.2* 34.0* 35.4* 35.7* 36.4*  MCV 92.0 92.3 92.1 93.2 92.2 92.2  PLT 187 177 174 188  189 212   Cardiac Enzymes: No results for input(s): CKTOTAL, CKMB, CKMBINDEX, TROPONINI in the last 168 hours. BNP (last 3 results)  Recent Labs  04/04/15 1831 07/28/15 0720  BNP 53.8 42.7    ProBNP (last 3 results) No results for input(s): PROBNP in the last 8760 hours.  CBG: No results for input(s): GLUCAP in the last 168 hours.  Recent Results (from the past 240 hour(s))  Surgical pcr screen     Status: None   Collection Time: 08/01/15 11:47 PM  Result Value Ref Range Status   MRSA, PCR NEGATIVE NEGATIVE Final   Staphylococcus aureus NEGATIVE NEGATIVE Final    Comment:        The Xpert SA Assay (FDA approved for NASAL specimens in patients over 55 years of age), is one component of a comprehensive surveillance program.  Test performance has been validated by [facility] for patients greater than or equal to 55 year old. It is not intended to diagnose infection nor to guide or monitor treatment.   Culture, respiratory (NON-Expectorated)     Status: None   Collection Time: 08/02/15  8:26 AM  Result Value Ref Range Status   Specimen Description BRONCHIAL WASHINGS RIGHT  Final   Special Requests NONE  Final   Gram Stain   Final    MODERATE WBC PRESENT, PREDOMINANTLY PMN RARE SQUAMOUS EPITHELIAL CELLS PRESENT FEW GRAM POSITIVE COCCI IN PAIRS FEW GRAM NEGATIVE COCCOBACILLI Performed at [facility]    Culture   Final    Non-Pathogenic Oropharyngeal-type Flora Isolated. Performed at [facility]    Report Status 08/05/2015 FINAL  Final  Gram stain     Status: None   Collection Time: 08/02/15  8:26 AM  Result Value Ref Range Status   Specimen Description BRONCHIAL WASHINGS  Final   Special Requests NONE  Final   Gram Stain   Final    MODERATE WBC PRESENT, PREDOMINANTLY PMN FEW GRAM POSITIVE COCCI IN PAIRS FEW GRAM NEGATIVE COCCOBACILLI RARE GRAM POSITIVE RODS    Report Status 08/02/2015 FINAL  Final  Culture, blood (routine x 2) Call MD if  unable to obtain prior to antibiotics being given     Status: None   Collection Time: 08/04/15  4:00 PM  Result Value Ref Range Status   Specimen Description BLOOD RIGHT ANTECUBITAL  Final   Special Requests BOTTLES DRAWN AEROBIC ONLY 10CC  Final   Culture NO GROWTH 5 DAYS  Final   Report Status 08/09/2015 FINAL  Final  Culture, blood (routine x 2) Call MD if unable to obtain prior to antibiotics being given     Status: None   Collection Time: 08/04/15  4:18 PM  Result Value Ref Range Status   Specimen Description BLOOD LEFT ANTECUBITAL  Final   Special Requests BOTTLES DRAWN AEROBIC AND ANAEROBIC 10CC  Final   Culture NO GROWTH 5 DAYS  Final   Report Status 08/09/2015 FINAL  Final     Studies: Dg Chest 2 View  08/10/2015  CLINICAL DATA:  Patient with right-sided chest pain and shortness of breath. EXAM: CHEST  2  VIEW COMPARISON:  Chest radiograph 08/09/2015 FINDINGS: Two right-sided chest tubes remain in place. Slight interval increase in size of moderate right pneumothorax, particularly the basilar component. Stable cardiac and mediastinal contours. Unchanged heterogeneous opacities right lung base. Left lung is clear. Mid thoracic spine degenerative changes. IMPRESSION: Slight interval increase in size of right pneumothorax with interval increase in size of the basilar component. Two right chest tubes remain in place. Persistent heterogeneous opacities right lung base. These results will be called to the ordering clinician or representative by the Radiologist Assistant, and communication documented in the PACS or zVision Dashboard. Electronically Signed   By: Lovey Newcomer M.D.   On: 08/10/2015 08:59   Dg Chest Port 1 View  08/09/2015  CLINICAL DATA:  Followup right pneumothorax with chest tubes in place. EXAM: PORTABLE CHEST 1 VIEW COMPARISON:  08/08/2015 and earlier. FINDINGS: 2 right chest tubes in place with a residual right apical, medial and basilar pneumothorax on the order of 15-20%  or so, unchanged. 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 Atelectasis at the right lung base, increased since yesterday. Left lung remains clear. Cardiac silhouette enlarged, unchanged. Pulmonary vascularity normal. IMPRESSION: 1. No change in the approximate 15-20% or so right pneumothorax with chest tubes in place. 2. Increasing right basilar atelectasis. Electronically Signed   By: Evangeline Dakin M.D.   On: 08/09/2015 08:14    Scheduled Meds: . budesonide-formoterol  2 puff Inhalation BID  . enoxaparin (LOVENOX) injection  40 mg Subcutaneous Q24H  . guaiFENesin  1,200 mg Oral BID  . ipratropium  0.5 mg Nebulization QID  . levalbuterol  0.63 mg Nebulization QID  . levofloxacin  750 mg Oral Daily  . polyethylene glycol  17 g Oral Daily  . predniSONE  40 mg Oral Q breakfast  . sodium chloride  3 mL Intravenous Q12H   Continuous Infusions:   Principal Problem:   Pneumothorax Active Problems:   Pneumothorax on right   Non-small cell carcinoma of right lung, stage 3 (HCC)   COPD (chronic obstructive pulmonary disease) (HCC)   Lung cancer (HCC)   HCAP (healthcare-associated pneumonia)   COPD exacerbation (HCC)   Chest tube in place   Pneumothorax, right    Time spent: 35 minutes    Karion Cudd M.D. [facility] Pager [phone] If 7PM-7AM, please contact night-coverage at [email or web address], password [facility] 08/10/2015, 1:02 PM  LOS: 13 days

## 2015-08-10 NOTE — Progress Notes (Signed)
Tele monitoring states pt having occasional missed beats.  Patient was asleep, states he is fine, no pain or distress. Call light within reach.

## 2015-08-11 ENCOUNTER — Inpatient Hospital Stay (HOSPITAL_COMMUNITY): Payer: 59

## 2015-08-11 MED ORDER — PREDNISONE 20 MG PO TABS
20.0000 mg | ORAL_TABLET | Freq: Every day | ORAL | Status: DC
  Administered 2015-08-12: 20 mg via ORAL
  Filled 2015-08-11: qty 1

## 2015-08-11 MED ORDER — PREDNISONE 20 MG PO TABS: 30.0000 mg | ORAL_TABLET | Freq: Every day | ORAL | Status: DC

## 2015-08-11 NOTE — Progress Notes (Addendum)
      [address]       [place] [address]             [phone]       9 Days Post-Op Procedure(s) (LRB): VIDEO BRONCHOSCOPY WITH ATTEMPTED INSERTION OF INTERBRONCHIAL VALVE (IBV) WITH FLUROSCOPY (N/A) INSERTION OF SECOND RIGHT CHEST TUBE WITH FLUROSCOPY (Right)  Subjective: Patient had some burning at chest tube site. He states his breathing is "fairly good".   Objective: Vital signs in last 24 hours: Temp:  [97.3 F (36.3 C)-98.1 F (36.7 C)] 97.3 F (36.3 C) (10/13 0606) Pulse Rate:  [84-105] 84 (10/13 0606) Cardiac Rhythm:  [-] Normal sinus rhythm (10/12 1948) Resp:  [18] 18 (10/13 0606) BP: (107-124)/(71-74) 107/74 mmHg (10/13 0606) SpO2:  [96 %-98 %] 96 % (10/13 0606)     Intake/Output from previous day: 10/12 0701 - 10/13 0700 In: 960 [P.O.:960] Out: 1860 [Urine:1750; Chest Tube:110]   Physical Exam:  Cardiovascular: RRR Pulmonary: Mostly clear to auscultation bilaterally Abdomen: Soft, non tender, bowel sounds present. Wounds: Clean and dry.  No erythema or signs of infection. Chest Tubes:to water seal with air leak in both  Lab Results: CBC:  Recent Labs  08/10/15 0429  WBC 8.7  HGB 11.9*  HCT 36.4*  PLT 212   BMET:   Recent Labs  08/09/15 0506 08/10/15 0429  NA 136 138  K 3.6 3.8  CL 100* 98*  CO2 29 29  GLUCOSE 85 81  BUN 16 15  CREATININE 0.81 0.94  CALCIUM 9.0 9.2    PT/INR: No results for input(s): LABPROT, INR in the last 72 hours. ABG:  INR: Will add last result for INR, ABG once components are confirmed Will add last 4 CBG results once components are confirmed  Assessment/Plan:  1. CV - SR in the 70's. 2.  Pulmonary - 2 chest tubes with 110 cc of output last 24 hours.There is an air leak in both that worsens with cough.  CXR appears similar to yesterday. Hope to remove one chest tube. On 2 liters of oxygen via [place]. Encourage incentive spirometer. 3. H and H stable at 11.9 and 36.4 4. Supplement  potassium 5.ID-On Levaquin for probable CAP  ZIMMERMAN,DONIELLE MPA-C 08/11/2015,8:02 AM   Plan to get one chest tbe out today,  I have seen and examined [name] and agree with the above assessment  and plan.  [name] Beeper [phone] Office [phone] 08/11/2015 11:19 AM

## 2015-08-11 NOTE — Progress Notes (Signed)
[facility] PROGRESS NOTE  Gabriel [name] WYO:[id number] DOB: 1941-09-18 DOA: 07/28/2015 PCP: No PCP Per Patient  Assessment/Plan:  recurrent pneumothorax  - May be secondary to history of lung cancer, COPD and ongoing tobacco abuse.  -  status post video bronchoscopy with attempted insertion of intrabronchial valve with fluoroscopy, insertion of second right chest tube 08/02/2015. - Management per CT surgery  stage III non-small cell lung cancer - followed by Dr. Earlie Server of oncology. Patient status post treatment with chemotherapy and radiation therapy, carboplatin and paclitaxel with partial response. - Outpatient follow-up with oncology.   COPD with acute exacerbation -  Continue Xopenex and Atrovent scheduled nebulizers, Symbicort,  finished Levaquin treatment,  continue with oral prednisone taper.  probable HCAP - Repeat chest x-ray with persistent right basilar opacity noted consistent with atelectasis or infiltrate. -  Treated with levofloxacin  leukocytosis - Resolved   prophylaxis Lovenox for DVT prophylaxis.    Code Status: Full Family Communication: None at bedside Disposition Plan: Per CVTS   Consultants:  CVTS: Dr Servando Snare 07/28/2015  Procedures:  Video bronchoscopy with attempted insertion of intrabronchial valve with fluoroscopy insertion of second right chest tube with fluoroscopy 08/02/2015 per Dr. Servando Snare   chest x-ray 08/02/2015, 08/03/2015, 08/04/2015     Antibiotics:  IV Levaquin 08/04/2015  HPI/Subjective: Patient states shortness of breath has improved. Patient feeling better.No CP.  Objective: Filed Vitals:   08/11/15 0606  BP: 107/74  Pulse: 84  Temp: 97.3 F (36.3 C)  Resp: 18    Intake/Output Summary (Last 24 hours) at 08/11/15 1356 Last data filed at 08/11/15 0831  Gross per 24 hour  Intake    600 ml  Output    760 ml  Net   -160 ml   Filed Weights   07/28/15 0652 07/28/15 1039  Weight: 61.236 kg (135 lb) 60.1  kg (132 lb 7.9 oz)    Exam:   General:  NAD  Cardiovascular: RRR  Respiratory: Less Rhonchorous breath sounds on the right greater than left. No wheezing  Abdomen: Soft, nontender, nondistended, positive bowel sounds.   Musculoskeletal:  no clubbing cyanosis or edema.   Data Reviewed: Basic Metabolic Panel:  Recent Labs Lab 08/06/15 0414 08/07/15 0335 08/08/15 0422 08/09/15 0506 08/10/15 0429  NA 136 137 137 136 138  K 4.1 4.4 4.0 3.6 3.8  CL 100* 100* 101 100* 98*  CO2 '26 27 26 29 29  '$ GLUCOSE 150* 133* 101* 85 81  BUN 22* '19 16 16 15  '$ CREATININE 0.93 0.83 0.83 0.81 0.94  CALCIUM 9.4 9.3 9.1 9.0 9.2  MG  --   --  1.7  --   --    Liver Function Tests: No results for input(s): AST, ALT, ALKPHOS, BILITOT, PROT, ALBUMIN in the last 168 hours. No results for input(s): LIPASE, AMYLASE in the last 168 hours. No results for input(s): AMMONIA in the last 168 hours. CBC:  Recent Labs Lab 08/05/15 0359 08/06/15 0414 08/07/15 0335 08/08/15 0422 08/10/15 0429  WBC 10.6* 13.3* 10.6* 8.1 8.7  NEUTROABS 10.0*  --   --   --   --   HGB 12.8* 11.7* 11.7* 11.9* 11.9*  HCT 38.2* 34.0* 35.4* 35.7* 36.4*  MCV 92.3 92.1 93.2 92.2 92.2  PLT 177 174 188 189 212   Cardiac Enzymes: No results for input(s): CKTOTAL, CKMB, CKMBINDEX, TROPONINI in the last 168 hours. BNP (last 3 results)  Recent Labs  04/04/15 1831 07/28/15 0720  BNP 53.8 42.7  ProBNP (last 3 results) No results for input(s): PROBNP in the last 8760 hours.  CBG: No results for input(s): GLUCAP in the last 168 hours.  Recent Results (from the past 240 hour(s))  Surgical pcr screen     Status: None   Collection Time: 08/01/15 11:47 PM  Result Value Ref Range Status   MRSA, PCR NEGATIVE NEGATIVE Final   Staphylococcus aureus NEGATIVE NEGATIVE Final    Comment:        The Xpert SA Assay (FDA approved for NASAL specimens in patients over 6 years of age), is one component of a comprehensive  surveillance program.  Test performance has been validated by [facility] for patients greater than or equal to 50 year old. It is not intended to diagnose infection nor to guide or monitor treatment.   Culture, respiratory (NON-Expectorated)     Status: None   Collection Time: 08/02/15  8:26 AM  Result Value Ref Range Status   Specimen Description BRONCHIAL WASHINGS RIGHT  Final   Special Requests NONE  Final   Gram Stain   Final    MODERATE WBC PRESENT, PREDOMINANTLY PMN RARE SQUAMOUS EPITHELIAL CELLS PRESENT FEW GRAM POSITIVE COCCI IN PAIRS FEW GRAM NEGATIVE COCCOBACILLI Performed at [facility]    Culture   Final    Non-Pathogenic Oropharyngeal-type Flora Isolated. Performed at [facility]    Report Status 08/05/2015 FINAL  Final  Gram stain     Status: None   Collection Time: 08/02/15  8:26 AM  Result Value Ref Range Status   Specimen Description BRONCHIAL WASHINGS  Final   Special Requests NONE  Final   Gram Stain   Final    MODERATE WBC PRESENT, PREDOMINANTLY PMN FEW GRAM POSITIVE COCCI IN PAIRS FEW GRAM NEGATIVE COCCOBACILLI RARE GRAM POSITIVE RODS    Report Status 08/02/2015 FINAL  Final  Culture, blood (routine x 2) Call MD if unable to obtain prior to antibiotics being given     Status: None   Collection Time: 08/04/15  4:00 PM  Result Value Ref Range Status   Specimen Description BLOOD RIGHT ANTECUBITAL  Final   Special Requests BOTTLES DRAWN AEROBIC ONLY 10CC  Final   Culture NO GROWTH 5 DAYS  Final   Report Status 08/09/2015 FINAL  Final  Culture, blood (routine x 2) Call MD if unable to obtain prior to antibiotics being given     Status: None   Collection Time: 08/04/15  4:18 PM  Result Value Ref Range Status   Specimen Description BLOOD LEFT ANTECUBITAL  Final   Special Requests BOTTLES DRAWN AEROBIC AND ANAEROBIC 10CC  Final   Culture NO GROWTH 5 DAYS  Final   Report Status 08/09/2015 FINAL  Final     Studies: Dg Chest 2  View  08/10/2015  CLINICAL DATA:  Patient with right-sided chest pain and shortness of breath. EXAM: CHEST  2 VIEW COMPARISON:  Chest radiograph 08/09/2015 FINDINGS: Two right-sided chest tubes remain in place. Slight interval increase in size of moderate right pneumothorax, particularly the basilar component. Stable cardiac and mediastinal contours. Unchanged heterogeneous opacities right lung base. Left lung is clear. Mid thoracic spine degenerative changes. IMPRESSION: Slight interval increase in size of right pneumothorax with interval increase in size of the basilar component. Two right chest tubes remain in place. Persistent heterogeneous opacities right lung base. These results will be called to the ordering clinician or representative by the Radiologist Assistant, and communication documented in the PACS or zVision Dashboard. Electronically  Signed   By: Lovey Newcomer M.D.   On: 08/10/2015 08:59   Dg Chest Port 1 View  08/11/2015  CLINICAL DATA:  Pneumothorax with chest tubes.  Right lung cancer. EXAM: PORTABLE CHEST 1 VIEW COMPARISON:  Chest radiograph from one day prior. FINDINGS: Two right-sided chest tubes are stable. Stable cardiomediastinal silhouette with top-normal heart size. Small moderate right pneumothorax with apical and basilar components is not appreciably changed. No left pneumothorax. No mediastinal shift. No pleural effusion. Patchy opacity in the right lower lung is unchanged. No new focal lung opacity. Emphysematous change is seen in the upper lungs. IMPRESSION: 1. Stable small to moderate right pneumothorax with apical and basilar components. 2. Stable nonspecific patchy right basilar lung opacity, likely a combination of atelectasis and tumor. Electronically Signed   By: Ilona Sorrel M.D.   On: 08/11/2015 08:07    Scheduled Meds: . budesonide-formoterol  2 puff Inhalation BID  . enoxaparin (LOVENOX) injection  40 mg Subcutaneous Q24H  . guaiFENesin  1,200 mg Oral BID  .  ipratropium  0.5 mg Nebulization QID  . levalbuterol  0.63 mg Nebulization QID  . levofloxacin  750 mg Oral Daily  . polyethylene glycol  17 g Oral Daily  . predniSONE  40 mg Oral Q breakfast  . sodium chloride  3 mL Intravenous Q12H   Continuous Infusions:   Principal Problem:   Pneumothorax Active Problems:   Pneumothorax on right   Non-small cell carcinoma of right lung, stage 3 (HCC)   COPD (chronic obstructive pulmonary disease) (HCC)   Lung cancer (HCC)   HCAP (healthcare-associated pneumonia)   COPD exacerbation (HCC)   Chest tube in place   Pneumothorax, right    Time spent: 35 minutes    Malky Rudzinski M.D. [facility] Pager [phone] If 7PM-7AM, please contact night-coverage at [email or web address], password [facility] 08/11/2015, 1:56 PM  LOS: 14 days

## 2015-08-11 NOTE — Progress Notes (Signed)
Pt doing "much better" after RN removed 1 chest tube today. 1 Chest tube remains intact, and to suction. Will cont to monitor. Etta Quill, RN

## 2015-08-11 NOTE — Progress Notes (Signed)
Utilization review completed.  

## 2015-08-12 ENCOUNTER — Inpatient Hospital Stay (HOSPITAL_COMMUNITY): Payer: 59

## 2015-08-12 DIAGNOSIS — J9311 Primary spontaneous pneumothorax: Secondary | ICD-10-CM

## 2015-08-12 MED ORDER — MAGIC MOUTHWASH
5.0000 mL | Freq: Four times a day (QID) | ORAL | Status: DC
  Administered 2015-08-12 – 2015-08-18 (×24): 5 mL via ORAL
  Filled 2015-08-12 (×31): qty 5

## 2015-08-12 MED ORDER — MENTHOL 3 MG MT LOZG
1.0000 | LOZENGE | OROMUCOSAL | Status: DC | PRN
  Filled 2015-08-12: qty 9

## 2015-08-12 MED ORDER — PREDNISONE 5 MG PO TABS
5.0000 mg | ORAL_TABLET | Freq: Every day | ORAL | Status: DC
  Administered 2015-08-13 – 2015-08-14 (×2): 5 mg via ORAL
  Filled 2015-08-12 (×2): qty 1

## 2015-08-12 NOTE — Progress Notes (Addendum)
      [address]       [place] [address]             [phone]       10 Days Post-Op Procedure(s) (LRB): VIDEO BRONCHOSCOPY WITH ATTEMPTED INSERTION OF INTERBRONCHIAL VALVE (IBV) WITH FLUROSCOPY (N/A) INSERTION OF SECOND RIGHT CHEST TUBE WITH FLUROSCOPY (Right)  Subjective: Patient without complaints this am  Objective: Vital signs in last 24 hours: Temp:  [97.6 F (36.4 C)-98.2 F (36.8 C)] 97.6 F (36.4 C) (10/14 0426) Pulse Rate:  [80-100] 80 (10/14 0426) Cardiac Rhythm:  [-] Normal sinus rhythm (10/13 1900) Resp:  [16-18] 18 (10/14 0426) BP: (104-135)/(64-83) 105/64 mmHg (10/14 0426) SpO2:  [96 %-99 %] 96 % (10/14 0426)     Intake/Output from previous day: 10/13 0701 - 10/14 0700 In: 480 [P.O.:480] Out: -    Physical Exam:  Cardiovascular: RRR Pulmonary: Clear to auscultation on left and coarse on right Abdomen: Soft, non tender, bowel sounds present. Wounds: Clean and dry.  No erythema or signs of infection. Chest Tubes:to suction and there is a +1  air leak with cough  Lab Results: CBC:  Recent Labs  08/10/15 0429  WBC 8.7  HGB 11.9*  HCT 36.4*  PLT 212   BMET:   Recent Labs  08/10/15 0429  NA 138  K 3.8  CL 98*  CO2 29  GLUCOSE 81  BUN 15  CREATININE 0.94  CALCIUM 9.2    PT/INR: No results for input(s): LABPROT, INR in the last 72 hours. ABG:  INR: Will add last result for INR, ABG once components are confirmed Will add last 4 CBG results once components are confirmed  Assessment/Plan:  1. CV - SR in the 70's. 2.  Pulmonary - On 2 liters of oxygen via [place].1 chest tube with 120 cc of utput last 24 hours.There is a +1 air leak with cough.  CXR appears stable. Hope to place remaining chest tube to water seal soon. Patient then will likely be transitioned to a mini express and go home with it. Encourage incentive spirometer.   ZIMMERMAN,DONIELLE MPA-C 08/12/2015,7:35 AM   Try miniexpress now , tentative plan  d/c home with miniexpress I have seen and examined [name] and agree with the above assessment  and plan.  [name] Beeper [phone] Office [phone] 08/12/2015 9:33 AM

## 2015-08-12 NOTE — Care Management Note (Signed)
Case Management Note [name] RN, BSN Unit 2W-Case Manager [phone]  Patient Details  Name: Gabriel [name] MRN: [id number] Date of Birth: 12-Oct-1941  Subjective/Objective:   Pt admitted with spont. pntx- with chest tube placement-                  Action/Plan: PTA pt lived at home- independent- anticipate return home when medically stable- per MD note today- may need to go home with CT to mini express- spoke with pt at bedside regarding possible [place] needs- list for [facility] agencies of [place] provided to pt for review- per pt choice he has chosen [facility] for [facility] service needs- referral called to Santiago Glad with [facility] for possible HH-RN needs for CT(mini express) referral accepted and they will follow  - will need MD order for HH-RN prior to discharge.  Expected Discharge Date:                 Expected Discharge Plan:  [place]  In-House Referral:  NA  Discharge planning Services  CM Consult  Post Acute Care Choice:  Home Health Choice offered to:  Patient  DME Arranged:    DME Agency:     HH Arranged:  RN [place] Agency:  [place]  Status of Service:  In process, will continue to follow  Medicare Important Message Given:    Date Medicare IM Given:    Medicare IM give by:    Date Additional Medicare IM Given:    Additional Medicare Important Message give by:     If discussed at [place] of Stay Meetings, dates discussed:  08/11/15  Additional Comments:  Dawayne Patricia, RN 08/12/2015, 3:25 PM

## 2015-08-12 NOTE — Progress Notes (Signed)
[facility] PROGRESS NOTE  DAIMION ADAMCIK GLO:[id number] DOB: [birth year] DOA: 07/28/2015 PCP: No PCP Per Patient  Assessment/Plan:  recurrent pneumothorax  - May be secondary to history of lung cancer, COPD and ongoing tobacco abuse.  -  status post video bronchoscopy with attempted insertion of intrabronchial valve with fluoroscopy, insertion of second right chest tube 08/02/2015,  one chest tube discontinued on 10/13. - Management as per CT surgery.  stage III non-small cell lung cancer - followed by Dr. Earlie Server of oncology. Patient status post treatment with chemotherapy and radiation therapy, carboplatin and paclitaxel with partial response. - Outpatient follow-up with oncology.   COPD with acute exacerbation -  Continue Xopenex and Atrovent scheduled nebulizers, Symbicort,  finished Levaquin treatment,  continue with oral prednisone taper.  Oral thrush - Started on Magic mouthwash.  probable HCAP - Repeat chest x-ray with persistent right basilar opacity noted consistent with atelectasis or infiltrate. -  Treated with levofloxacin  leukocytosis - Resolved   prophylaxis Lovenox for DVT prophylaxis.    Code Status: Full Family Communication: None at bedside Disposition Plan: Per CVTS   Consultants:  CVTS: Dr Servando Snare 07/28/2015  Procedures:  Video bronchoscopy with attempted insertion of intrabronchial valve with fluoroscopy insertion of second right chest tube with fluoroscopy 08/02/2015 per Dr. Servando Snare   chest x-ray 08/02/2015, 08/03/2015, 08/04/2015     Antibiotics:  IV Levaquin 08/04/2015  HPI/Subjective: Patient states shortness of breath has improved. Patient feeling better.No CP.  Objective: Filed Vitals:   08/12/15 0902  BP:   Pulse: 80  Temp:   Resp: 18    Intake/Output Summary (Last 24 hours) at 08/12/15 1115 Last data filed at 08/12/15 0815  Gross per 24 hour  Intake    480 ml  Output    120 ml  Net    360 ml   Filed Weights    07/28/15 0652 07/28/15 1039  Weight: 61.236 kg (135 lb) 60.1 kg (132 lb 7.9 oz)    Exam:   General:  NAD  Cardiovascular: RRR  Respiratory: Less Rhonchorous breath sounds on the right greater than left. No wheezing  Abdomen: Soft, nontender, nondistended, positive bowel sounds.   Musculoskeletal:  no clubbing cyanosis or edema.   Data Reviewed: Basic Metabolic Panel:  Recent Labs Lab 08/06/15 0414 08/07/15 0335 08/08/15 0422 08/09/15 0506 08/10/15 0429  NA 136 137 137 136 138  K 4.1 4.4 4.0 3.6 3.8  CL 100* 100* 101 100* 98*  CO2 '26 27 26 29 29  '$ GLUCOSE 150* 133* 101* 85 81  BUN 22* '19 16 16 15  '$ CREATININE 0.93 0.83 0.83 0.81 0.94  CALCIUM 9.4 9.3 9.1 9.0 9.2  MG  --   --  1.7  --   --    Liver Function Tests: No results for input(s): AST, ALT, ALKPHOS, BILITOT, PROT, ALBUMIN in the last 168 hours. No results for input(s): LIPASE, AMYLASE in the last 168 hours. No results for input(s): AMMONIA in the last 168 hours. CBC:  Recent Labs Lab 08/06/15 0414 08/07/15 0335 08/08/15 0422 08/10/15 0429  WBC 13.3* 10.6* 8.1 8.7  HGB 11.7* 11.7* 11.9* 11.9*  HCT 34.0* 35.4* 35.7* 36.4*  MCV 92.1 93.2 92.2 92.2  PLT 174 188 189 212   Cardiac Enzymes: No results for input(s): CKTOTAL, CKMB, CKMBINDEX, TROPONINI in the last 168 hours. BNP (last 3 results)  Recent Labs  04/04/15 1831 07/28/15 0720  BNP 53.8 42.7    ProBNP (last 3 results) No results for input(s):  PROBNP in the last 8760 hours.  CBG: No results for input(s): GLUCAP in the last 168 hours.  Recent Results (from the past 240 hour(s))  Culture, blood (routine x 2) Call MD if unable to obtain prior to antibiotics being given     Status: None   Collection Time: 08/04/15  4:00 PM  Result Value Ref Range Status   Specimen Description BLOOD RIGHT ANTECUBITAL  Final   Special Requests BOTTLES DRAWN AEROBIC ONLY 10CC  Final   Culture NO GROWTH 5 DAYS  Final   Report Status 08/09/2015 FINAL  Final   Culture, blood (routine x 2) Call MD if unable to obtain prior to antibiotics being given     Status: None   Collection Time: 08/04/15  4:18 PM  Result Value Ref Range Status   Specimen Description BLOOD LEFT ANTECUBITAL  Final   Special Requests BOTTLES DRAWN AEROBIC AND ANAEROBIC 10CC  Final   Culture NO GROWTH 5 DAYS  Final   Report Status 08/09/2015 FINAL  Final     Studies: Dg Chest Port 1 View  08/12/2015  CLINICAL DATA:  Chest tube in place EXAM: PORTABLE CHEST - 1 VIEW COMPARISON:  08/11/2015 FINDINGS: 1 of the 2 right chest tubes has been removed. A chest tube remains directed towards the infrahilar region. Moderate right pneumothorax has slightly improved, with persistent apical and basilar components. Apex of the lung projects at the posterior aspect of the third rib. Partial resolution of the atelectasis/consolidation at the right lung base. Left lung remains clear.  Heart size normal. No effusion. Old Left sixth and seventh rib fractures. IMPRESSION: 1. Removal of 1 of 2 right chest tubes, with slight decrease in residual pneumothorax and right lower lung atelectasis/consolidation. Electronically Signed   By: Lucrezia Europe M.D.   On: 08/12/2015 08:09   Dg Chest Port 1 View  08/11/2015  CLINICAL DATA:  Pneumothorax with chest tubes.  Right lung cancer. EXAM: PORTABLE CHEST 1 VIEW COMPARISON:  Chest radiograph from one day prior. FINDINGS: Two right-sided chest tubes are stable. Stable cardiomediastinal silhouette with top-normal heart size. Small moderate right pneumothorax with apical and basilar components is not appreciably changed. No left pneumothorax. No mediastinal shift. No pleural effusion. Patchy opacity in the right lower lung is unchanged. No new focal lung opacity. Emphysematous change is seen in the upper lungs. IMPRESSION: 1. Stable small to moderate right pneumothorax with apical and basilar components. 2. Stable nonspecific patchy right basilar lung opacity, likely a  combination of atelectasis and tumor. Electronically Signed   By: Ilona Sorrel M.D.   On: 08/11/2015 08:07    Scheduled Meds: . budesonide-formoterol  2 puff Inhalation BID  . enoxaparin (LOVENOX) injection  40 mg Subcutaneous Q24H  . guaiFENesin  1,200 mg Oral BID  . ipratropium  0.5 mg Nebulization QID  . levalbuterol  0.63 mg Nebulization QID  . magic mouthwash  5 mL Oral QID  . polyethylene glycol  17 g Oral Daily  . predniSONE  20 mg Oral Q breakfast  . sodium chloride  3 mL Intravenous Q12H   Continuous Infusions:   Principal Problem:   Pneumothorax Active Problems:   Pneumothorax on right   Non-small cell carcinoma of right lung, stage 3 (HCC)   COPD (chronic obstructive pulmonary disease) (HCC)   Lung cancer (HCC)   HCAP (healthcare-associated pneumonia)   COPD exacerbation (HCC)   Chest tube in place   Pneumothorax, right    Time spent: 30 minutes  [facility], Anjanae Woehrle M.D. [facility] Pager [phone] If 7PM-7AM, please contact night-coverage at [email or web address], password [facility] 08/12/2015, 11:15 AM  LOS: 15 days

## 2015-08-12 NOTE — Progress Notes (Signed)
Per X-ray results, the pneumothorax and atelectasis are both increasing.  Per order from Dr Roxan Hockey, patient placed back on suction at 20.  Patient is asymptomatic at this time and was asleep.  Did advise patient of the pneumothorax and atelectasis and why he was being put back on suction.  Advised him to let me know if he had any discomfort or pain, call light within reach.

## 2015-08-12 NOTE — Progress Notes (Signed)
Patient sitting on side of bed.  Felt very short of breath when ambulating but not at this time. Call light within reach

## 2015-08-12 NOTE — Progress Notes (Signed)
Paged Dr Roxan Hockey, 2 view chest xray ordered per phone call with read back.  Order placed.

## 2015-08-13 ENCOUNTER — Inpatient Hospital Stay (HOSPITAL_COMMUNITY): Payer: 59

## 2015-08-13 LAB — BASIC METABOLIC PANEL
Anion gap: 8 (ref 5–15)
BUN: 21 mg/dL — AB (ref 6–20)
CHLORIDE: 101 mmol/L (ref 101–111)
CO2: 28 mmol/L (ref 22–32)
CREATININE: 0.95 mg/dL (ref 0.61–1.24)
Calcium: 9 mg/dL (ref 8.9–10.3)
GFR calc Af Amer: >60 mL/min (ref >60)
GFR calc non Af Amer: >60 mL/min (ref >60)
GLUCOSE: 78 mg/dL (ref 65–99)
Potassium: 3.9 mmol/L (ref 3.5–5.1)
SODIUM: 137 mmol/L (ref 135–145)

## 2015-08-13 LAB — CBC
HCT: 36.4 % — ABNORMAL LOW (ref 39.0–52.0)
Hemoglobin: 12.2 g/dL — ABNORMAL LOW (ref 13.0–17.0)
MCH: 31.1 pg (ref 26.0–34.0)
MCHC: 33.5 g/dL (ref 30.0–36.0)
MCV: 92.9 fL (ref 78.0–100.0)
PLATELETS: 219 10*3/uL (ref 150–400)
RBC: 3.92 MIL/uL — ABNORMAL LOW (ref 4.22–5.81)
RDW: 15.8 % — AB (ref 11.5–15.5)
WBC: 9.3 10*3/uL (ref 4.0–10.5)

## 2015-08-13 MED ORDER — LEVALBUTEROL HCL 0.63 MG/3ML IN NEBU
0.6300 mg | INHALATION_SOLUTION | Freq: Three times a day (TID) | RESPIRATORY_TRACT | Status: DC
  Administered 2015-08-13 – 2015-08-15 (×6): 0.63 mg via RESPIRATORY_TRACT
  Filled 2015-08-13 (×6): qty 3

## 2015-08-13 MED ORDER — IPRATROPIUM BROMIDE 0.02 % IN SOLN
0.5000 mg | Freq: Three times a day (TID) | RESPIRATORY_TRACT | Status: DC
  Administered 2015-08-13 – 2015-08-15 (×6): 0.5 mg via RESPIRATORY_TRACT
  Filled 2015-08-13 (×6): qty 2.5

## 2015-08-13 NOTE — Progress Notes (Signed)
[facility] PROGRESS NOTE  Gabriel [name] HQI:[id number] DOB: 1941-10-09 DOA: 07/28/2015 PCP: No PCP Per Patient  Assessment/Plan:  recurrent pneumothorax  - May be secondary to history of lung cancer, COPD and ongoing tobacco abuse.  -  status post video bronchoscopy with attempted insertion of intrabronchial valve with fluoroscopy, insertion of second right chest tube 08/02/2015,  one chest tube discontinued on 10/13. - Management as per CT surgery. Had an episode of shortness of breath on ambulation overnight, chest tube back on Pleurovac on - 20 cm suction  stage III non-small cell lung cancer - followed by Dr. Earlie Server of oncology. Patient status post treatment with chemotherapy and radiation therapy, carboplatin and paclitaxel with partial response. - Outpatient follow-up with oncology.   COPD with acute exacerbation -  Continue Xopenex and Atrovent scheduled nebulizers, Symbicort,  finished Levaquin treatment,  continue with oral prednisone taper.  Oral thrush - Started on Magic mouthwash.  probable HCAP - Repeat chest x-ray with persistent right basilar opacity noted consistent with atelectasis or infiltrate. -  Treated with levofloxacin  leukocytosis - Resolved   prophylaxis Lovenox for DVT prophylaxis.    Code Status: Full Family Communication: None at bedside Disposition Plan: Per CVTS   Consultants:  CVTS: Dr Servando Snare 07/28/2015  Procedures:  Video bronchoscopy with attempted insertion of intrabronchial valve with fluoroscopy insertion of second right chest tube with fluoroscopy 08/02/2015 per Dr. Servando Snare   chest x-ray 08/02/2015, 08/03/2015, 08/04/2015     Antibiotics:  IV Levaquin 08/04/2015  HPI/Subjective: Patient states shortness of breath has improved. Patient feeling better.No CP.  Objective: Filed Vitals:   08/13/15 0438  BP: 107/66  Pulse: 79  Temp: 98.3 F (36.8 C)  Resp: 18    Intake/Output Summary (Last 24 hours) at  08/13/15 1215 Last data filed at 08/13/15 0800  Gross per 24 hour  Intake    843 ml  Output     61 ml  Net    782 ml   Filed Weights   07/28/15 0652 07/28/15 1039  Weight: 61.236 kg (135 lb) 60.1 kg (132 lb 7.9 oz)    Exam:   General:  NAD  Cardiovascular: RRR  Respiratory: Less Rhonchorous breath sounds on the right greater than left. No wheezing  Abdomen: Soft, nontender, nondistended, positive bowel sounds.   Musculoskeletal:  no clubbing cyanosis or edema.   Data Reviewed: Basic Metabolic Panel:  Recent Labs Lab 08/07/15 0335 08/08/15 0422 08/09/15 0506 08/10/15 0429 08/13/15 0416  NA 137 137 136 138 137  K 4.4 4.0 3.6 3.8 3.9  CL 100* 101 100* 98* 101  CO2 '27 26 29 29 28  '$ GLUCOSE 133* 101* 85 81 78  BUN '19 16 16 15 '$ 21*  CREATININE 0.83 0.83 0.81 0.94 0.95  CALCIUM 9.3 9.1 9.0 9.2 9.0  MG  --  1.7  --   --   --    Liver Function Tests: No results for input(s): AST, ALT, ALKPHOS, BILITOT, PROT, ALBUMIN in the last 168 hours. No results for input(s): LIPASE, AMYLASE in the last 168 hours. No results for input(s): AMMONIA in the last 168 hours. CBC:  Recent Labs Lab 08/07/15 0335 08/08/15 0422 08/10/15 0429 08/13/15 0416  WBC 10.6* 8.1 8.7 9.3  HGB 11.7* 11.9* 11.9* 12.2*  HCT 35.4* 35.7* 36.4* 36.4*  MCV 93.2 92.2 92.2 92.9  PLT 188 189 212 219   Cardiac Enzymes: No results for input(s): CKTOTAL, CKMB, CKMBINDEX, TROPONINI in the last 168 hours. BNP (last 3  results)  Recent Labs  04/04/15 1831 07/28/15 0720  BNP 53.8 42.7    ProBNP (last 3 results) No results for input(s): PROBNP in the last 8760 hours.  CBG: No results for input(s): GLUCAP in the last 168 hours.  Recent Results (from the past 240 hour(s))  Culture, blood (routine x 2) Call MD if unable to obtain prior to antibiotics being given     Status: None   Collection Time: 08/04/15  4:00 PM  Result Value Ref Range Status   Specimen Description BLOOD RIGHT ANTECUBITAL  Final    Special Requests BOTTLES DRAWN AEROBIC ONLY 10CC  Final   Culture NO GROWTH 5 DAYS  Final   Report Status 08/09/2015 FINAL  Final  Culture, blood (routine x 2) Call MD if unable to obtain prior to antibiotics being given     Status: None   Collection Time: 08/04/15  4:18 PM  Result Value Ref Range Status   Specimen Description BLOOD LEFT ANTECUBITAL  Final   Special Requests BOTTLES DRAWN AEROBIC AND ANAEROBIC 10CC  Final   Culture NO GROWTH 5 DAYS  Final   Report Status 08/09/2015 FINAL  Final     Studies: Dg Chest 2 View  08/13/2015  CLINICAL DATA:  Right chest tube EXAM: CHEST  2 VIEW COMPARISON:  08/12/2015 FINDINGS: Moderate right apical/basilar pneumothorax, mildly decreased. Indwelling medial right chest tube. Left lung is clear, noting emphysematous changes. Heart is normal in size. Degenerative changes of the visualized thoracolumbar spine. IMPRESSION: Moderate right pneumothorax, mildly decreased. Indwelling medial right chest tube. Electronically Signed   By: Julian Hy M.D.   On: 08/13/2015 09:19   Dg Chest 2 View  08/12/2015  CLINICAL DATA:  Spontaneous pneumothorax. Chest tube. Shortness of breath today. Congestion this evening. No fever. Smoker. EXAM: CHEST  2 VIEW COMPARISON:  08/12/2015 FINDINGS: Right chest tube is unchanged in position. Persistent right pneumothorax is increasing since previous study with increased atelectasis in the right lung. Left lung is clear. Heart size and pulmonary vascularity are normal. Calcification of the aorta. IMPRESSION: Increasing right pneumothorax with increased atelectasis in the right lung despite chest tube. Electronically Signed   By: Lucienne Capers M.D.   On: 08/12/2015 22:32   Dg Chest Port 1 View  08/12/2015  CLINICAL DATA:  Chest tube in place EXAM: PORTABLE CHEST - 1 VIEW COMPARISON:  08/11/2015 FINDINGS: 1 of the 2 right chest tubes has been removed. A chest tube remains directed towards the infrahilar region. Moderate  right pneumothorax has slightly improved, with persistent apical and basilar components. Apex of the lung projects at the posterior aspect of the third rib. Partial resolution of the atelectasis/consolidation at the right lung base. Left lung remains clear.  Heart size normal. No effusion. Old Left sixth and seventh rib fractures. IMPRESSION: 1. Removal of 1 of 2 right chest tubes, with slight decrease in residual pneumothorax and right lower lung atelectasis/consolidation. Electronically Signed   By: Lucrezia Europe M.D.   On: 08/12/2015 08:09    Scheduled Meds: . budesonide-formoterol  2 puff Inhalation BID  . enoxaparin (LOVENOX) injection  40 mg Subcutaneous Q24H  . guaiFENesin  1,200 mg Oral BID  . ipratropium  0.5 mg Nebulization TID  . levalbuterol  0.63 mg Nebulization TID  . magic mouthwash  5 mL Oral QID  . polyethylene glycol  17 g Oral Daily  . predniSONE  5 mg Oral Q breakfast  . sodium chloride  3 mL Intravenous Q12H  Continuous Infusions:   Principal Problem:   Pneumothorax Active Problems:   Pneumothorax on right   Non-small cell carcinoma of right lung, stage 3 (HCC)   COPD (chronic obstructive pulmonary disease) (HCC)   Lung cancer (HCC)   HCAP (healthcare-associated pneumonia)   COPD exacerbation (HCC)   Chest tube in place   Pneumothorax, right    Time spent: 30 minutes    Monia Timmers M.D. [facility] Pager [phone] If 7PM-7AM, please contact night-coverage at [email or web address], password [facility] 08/13/2015, 12:15 PM  LOS: 16 days

## 2015-08-13 NOTE — Progress Notes (Addendum)
       [address]       [place] [address]             [phone]          11 Days Post-Op Procedure(s) (LRB): VIDEO BRONCHOSCOPY WITH ATTEMPTED INSERTION OF INTERBRONCHIAL VALVE (IBV) WITH FLUROSCOPY (N/A) INSERTION OF SECOND RIGHT CHEST TUBE WITH FLUROSCOPY (Right)  Subjective: Breathing improved this am, no pain.   Objective: Vital signs in last 24 hours: Patient Vitals for the past 24 hrs:  BP Temp Temp src Pulse Resp SpO2  08/13/15 0751 - - - - - 94 %  08/13/15 0438 107/66 mmHg 98.3 F (36.8 C) Oral 79 18 98 %  08/12/15 2117 137/78 mmHg - - (!) 110 18 98 %  08/12/15 2014 122/71 mmHg 98.1 F (36.7 C) Oral 100 16 96 %  08/12/15 2013 - - - - - 98 %  08/12/15 1638 - - - 80 18 98 %  08/12/15 1554 112/71 mmHg - - - - 98 %  08/12/15 1315 - - - 86 18 -   Current Weight  07/28/15 132 lb 7.9 oz (60.1 kg)     Intake/Output from previous day: 10/14 0701 - 10/15 0700 In: 723 [P.O.:720; I.V.:3] Out: 181 [Stool:1; Chest Tube:180]    PHYSICAL EXAM:  Heart: RRR Lungs: Exp wheezes bilaterally Chest tube: + air leak on Mini Express    Lab Results: CBC: Recent Labs  08/13/15 0416  WBC 9.3  HGB 12.2*  HCT 36.4*  PLT 219   BMET:  Recent Labs  08/13/15 0416  NA 137  K 3.9  CL 101  CO2 28  GLUCOSE 78  BUN 21*  CREATININE 0.95  CALCIUM 9.0    PT/INR: No results for input(s): LABPROT, INR in the last 72 hours.  CXR: FINDINGS: Moderate right apical/basilar pneumothorax, mildly decreased. Indwelling medial right chest tube.  Left lung is clear, noting emphysematous changes.  Heart is normal in size.  Degenerative changes of the visualized thoracolumbar spine.  IMPRESSION: Moderate right pneumothorax, mildly decreased. Indwelling medial right chest tube.   Assessment/Plan: S/P Procedure(s) (LRB): VIDEO BRONCHOSCOPY WITH ATTEMPTED INSERTION OF INTERBRONCHIAL VALVE (IBV) WITH FLUROSCOPY (N/A) INSERTION OF SECOND RIGHT CHEST  TUBE WITH FLUROSCOPY (Right) Developed increased SOB on Mini Express last night with increase in ptx. Now back on suction without much improvement.  Will return CT to Pleurovac and continue to -20 cm suction.   LOS: 16 days    COLLINS,GINA H 08/13/2015  Patient seen and examined, agree with above Keep on suction today. Will try off suction again tomorrow  Remo Lipps C. Roxan Hockey, MD [facility] [phone]

## 2015-08-13 NOTE — Progress Notes (Signed)
Patient sitting on side of bed.  Daughter and son at bedside, no distress.  Call light within reach

## 2015-08-13 NOTE — Progress Notes (Signed)
Patient sitting up in bed, no distress. Call light within reach

## 2015-08-14 ENCOUNTER — Inpatient Hospital Stay (HOSPITAL_COMMUNITY): Payer: 59

## 2015-08-14 MED ORDER — SODIUM CHLORIDE 0.9 % IJ SOLN: 3.0000 mL | INTRAMUSCULAR | Status: DC | PRN

## 2015-08-14 MED ORDER — SODIUM CHLORIDE 0.9 % IJ SOLN
3.0000 mL | Freq: Two times a day (BID) | INTRAMUSCULAR | Status: DC
  Administered 2015-08-14 – 2015-08-17 (×6): 3 mL via INTRAVENOUS

## 2015-08-14 NOTE — Progress Notes (Signed)
Utilization review completed.  

## 2015-08-14 NOTE — Progress Notes (Addendum)
       [address]       [place] [address]             [phone]          12 Days Post-Op Procedure(s) (LRB): VIDEO BRONCHOSCOPY WITH ATTEMPTED INSERTION OF INTERBRONCHIAL VALVE (IBV) WITH FLUROSCOPY (N/A) INSERTION OF SECOND RIGHT CHEST TUBE WITH FLUROSCOPY (Right)  Subjective: Comfortable, breathing stable this am.   Objective: Vital signs in last 24 hours: Patient Vitals for the past 24 hrs:  BP Temp Temp src Pulse Resp SpO2  08/14/15 0434 107/63 mmHg 97.7 F (36.5 C) Oral 75 18 99 %  08/13/15 2046 131/78 mmHg 97.5 F (36.4 C) Oral 90 18 97 %  08/13/15 2020 - - - - - 97 %  08/13/15 1331 119/69 mmHg 97.5 F (36.4 C) Axillary 90 19 -  08/13/15 1325 - - - - - 97 %   Current Weight  07/28/15 132 lb 7.9 oz (60.1 kg)     Intake/Output from previous day: 10/15 0701 - 10/16 0700 In: 363 [P.O.:360; I.V.:3] Out: 100 [Chest Tube:100]    PHYSICAL EXAM:  Heart: RRR Lungs: Some exp wheezes bilaterally, slightly diminished on R Chest tube: No air leak    Lab Results: CBC: Recent Labs  08/13/15 0416  WBC 9.3  HGB 12.2*  HCT 36.4*  PLT 219   BMET:  Recent Labs  08/13/15 0416  NA 137  K 3.9  CL 101  CO2 28  GLUCOSE 78  BUN 21*  CREATININE 0.95  CALCIUM 9.0    PT/INR: No results for input(s): LABPROT, INR in the last 72 hours.  CXR: FINDINGS: Midline trachea. Mild cardiomegaly. Mild right hemidiaphragm elevation. Remote left rib trauma. No pleural fluid. Right-sided chest tube remains in place. Moderate right-sided pneumothorax. Visceral pleural line measures on the order of 2.4 cm from the chest wall superiorly and laterally. Decreased from 3.6 cm at the same location on the prior. Inferolateral component is also less well-defined today. Secondary right infrahilar atelectasis.  IMPRESSION: Decreased in moderate right-sided pneumothorax, right-sided chest tube remaining in place.   Assessment/Plan: S/P Procedure(s)  (LRB): VIDEO BRONCHOSCOPY WITH ATTEMPTED INSERTION OF INTERBRONCHIAL VALVE (IBV) WITH FLUROSCOPY (N/A) INSERTION OF SECOND RIGHT CHEST TUBE WITH FLUROSCOPY (Right) CXR with some improvement of ptx on suction, no air leak on exam. Pt reluctant to place back on water seal today. Will continue suction for now.   LOS: 17 days    [place] 08/14/2015  Patient seen and examined He is understandably anxious about CT being taken off suction His CXR looks better and I do not see an air leak currently- will keep on suction today Take off suction on AM prior to [place]. Roxan Hockey, MD [facility] [phone]

## 2015-08-14 NOTE — Progress Notes (Signed)
[facility] PROGRESS NOTE  Gabriel [name] ALP:[id number] DOB: [birth year] DOA: 07/28/2015 PCP: No PCP Per Patient  Assessment/Plan:  recurrent pneumothorax  - May be secondary to history of lung cancer, COPD and ongoing tobacco abuse.  -  status post video bronchoscopy with attempted insertion of intrabronchial valve with fluoroscopy, insertion of second right chest tube 08/02/2015,  one chest tube discontinued on 10/13. - Management as per CT surgery, chest x-ray with mild improvement of pneumothorax today, will keep on suction today, off suction. 2 chest x-ray in a.m. as per CT surgery.  stage III non-small cell lung cancer - followed by Dr. Earlie Server of oncology. Patient status post treatment with chemotherapy and radiation therapy, carboplatin and paclitaxel with partial response. - Outpatient follow-up with oncology.   COPD with acute exacerbation -  Continue Xopenex and Atrovent scheduled nebulizers, Symbicort,  finished Levaquin treatment, finished steroid taper, currently off prednisone..  Oral thrush - Started on Magic mouthwash.  probable HCAP - Repeat chest x-ray with persistent right basilar opacity noted consistent with atelectasis or infiltrate. -  Treated with levofloxacin  leukocytosis - Resolved   prophylaxis Lovenox for DVT prophylaxis.    Code Status: Full Family Communication: None at bedside Disposition Plan: Per CVTS   Consultants:  CVTS: Dr Servando Snare 07/28/2015  Procedures:  Video bronchoscopy with attempted insertion of intrabronchial valve with fluoroscopy insertion of second right chest tube with fluoroscopy 08/02/2015 per Dr. Servando Snare   chest x-ray 08/02/2015, 08/03/2015, 08/04/2015     Antibiotics:  IV Levaquin 08/04/2015  HPI/Subjective: Patient states shortness of breath has improved. Patient feeling better.No CP.  Objective: Filed Vitals:   08/14/15 0434  BP: 107/63  Pulse: 75  Temp: 97.7 F (36.5 C)  Resp: 18     Intake/Output Summary (Last 24 hours) at 08/14/15 1230 Last data filed at 08/14/15 1046  Gross per 24 hour  Intake    246 ml  Output    100 ml  Net    146 ml   Filed Weights   07/28/15 0652 07/28/15 1039  Weight: 61.236 kg (135 lb) 60.1 kg (132 lb 7.9 oz)    Exam:   General:  NAD  Cardiovascular: RRR  Respiratory: Less Rhonchorous breath sounds on the right greater than left. No wheezing  Abdomen: Soft, nontender, nondistended, positive bowel sounds.   Musculoskeletal:  no clubbing cyanosis or edema.   Data Reviewed: Basic Metabolic Panel:  Recent Labs Lab 08/08/15 0422 08/09/15 0506 08/10/15 0429 08/13/15 0416  NA 137 136 138 137  K 4.0 3.6 3.8 3.9  CL 101 100* 98* 101  CO2 '26 29 29 28  '$ GLUCOSE 101* 85 81 78  BUN '16 16 15 '$ 21*  CREATININE 0.83 0.81 0.94 0.95  CALCIUM 9.1 9.0 9.2 9.0  MG 1.7  --   --   --    Liver Function Tests: No results for input(s): AST, ALT, ALKPHOS, BILITOT, PROT, ALBUMIN in the last 168 hours. No results for input(s): LIPASE, AMYLASE in the last 168 hours. No results for input(s): AMMONIA in the last 168 hours. CBC:  Recent Labs Lab 08/08/15 0422 08/10/15 0429 08/13/15 0416  WBC 8.1 8.7 9.3  HGB 11.9* 11.9* 12.2*  HCT 35.7* 36.4* 36.4*  MCV 92.2 92.2 92.9  PLT 189 212 219   Cardiac Enzymes: No results for input(s): CKTOTAL, CKMB, CKMBINDEX, TROPONINI in the last 168 hours. BNP (last 3 results)  Recent Labs  04/04/15 1831 07/28/15 0720  BNP 53.8 42.7    ProBNP (  last 3 results) No results for input(s): PROBNP in the last 8760 hours.  CBG: No results for input(s): GLUCAP in the last 168 hours.  Recent Results (from the past 240 hour(s))  Culture, blood (routine x 2) Call MD if unable to obtain prior to antibiotics being given     Status: None   Collection Time: 08/04/15  4:00 PM  Result Value Ref Range Status   Specimen Description BLOOD RIGHT ANTECUBITAL  Final   Special Requests BOTTLES DRAWN AEROBIC ONLY  10CC  Final   Culture NO GROWTH 5 DAYS  Final   Report Status 08/09/2015 FINAL  Final  Culture, blood (routine x 2) Call MD if unable to obtain prior to antibiotics being given     Status: None   Collection Time: 08/04/15  4:18 PM  Result Value Ref Range Status   Specimen Description BLOOD LEFT ANTECUBITAL  Final   Special Requests BOTTLES DRAWN AEROBIC AND ANAEROBIC 10CC  Final   Culture NO GROWTH 5 DAYS  Final   Report Status 08/09/2015 FINAL  Final     Studies: Dg Chest 2 View  08/13/2015  CLINICAL DATA:  Right chest tube EXAM: CHEST  2 VIEW COMPARISON:  08/12/2015 FINDINGS: Moderate right apical/basilar pneumothorax, mildly decreased. Indwelling medial right chest tube. Left lung is clear, noting emphysematous changes. Heart is normal in size. Degenerative changes of the visualized thoracolumbar spine. IMPRESSION: Moderate right pneumothorax, mildly decreased. Indwelling medial right chest tube. Electronically Signed   By: Julian Hy M.D.   On: 08/13/2015 09:19   Dg Chest 2 View  08/12/2015  CLINICAL DATA:  Spontaneous pneumothorax. Chest tube. Shortness of breath today. Congestion this evening. No fever. Smoker. EXAM: CHEST  2 VIEW COMPARISON:  08/12/2015 FINDINGS: Right chest tube is unchanged in position. Persistent right pneumothorax is increasing since previous study with increased atelectasis in the right lung. Left lung is clear. Heart size and pulmonary vascularity are normal. Calcification of the aorta. IMPRESSION: Increasing right pneumothorax with increased atelectasis in the right lung despite chest tube. Electronically Signed   By: Lucienne Capers M.D.   On: 08/12/2015 22:32   Dg Chest Port 1 View  08/14/2015  CLINICAL DATA:  Right sided pneumothorax Hx of cancer, spontaneous pneumothorax, lung mass, inguinal hernia EXAM: PORTABLE CHEST 1 VIEW COMPARISON:  One day prior FINDINGS: Midline trachea. Mild cardiomegaly. Mild right hemidiaphragm elevation. Remote left rib  trauma. No pleural fluid. Right-sided chest tube remains in place. Moderate right-sided pneumothorax. Visceral pleural line measures on the order of 2.4 cm from the chest wall superiorly and laterally. Decreased from 3.6 cm at the same location on the prior. Inferolateral component is also less well-defined today. Secondary right infrahilar atelectasis. IMPRESSION: Decreased in moderate right-sided pneumothorax, right-sided chest tube remaining in place. No new findings. Electronically Signed   By: Abigail Miyamoto M.D.   On: 08/14/2015 08:55    Scheduled Meds: . budesonide-formoterol  2 puff Inhalation BID  . enoxaparin (LOVENOX) injection  40 mg Subcutaneous Q24H  . guaiFENesin  1,200 mg Oral BID  . ipratropium  0.5 mg Nebulization TID  . levalbuterol  0.63 mg Nebulization TID  . magic mouthwash  5 mL Oral QID  . polyethylene glycol  17 g Oral Daily  . predniSONE  5 mg Oral Q breakfast  . sodium chloride  3 mL Intravenous Q12H  . sodium chloride  3 mL Intravenous Q12H   Continuous Infusions:   Principal Problem:   Pneumothorax Active Problems:  Pneumothorax on right   Non-small cell carcinoma of right lung, stage 3 (HCC)   COPD (chronic obstructive pulmonary disease) ([place])   Lung cancer ([place])   HCAP (healthcare-associated pneumonia)   COPD exacerbation (HCC)   Chest tube in place   Pneumothorax, right    Time spent: 30 minutes    Therma Lasure M.D. [facility] Pager [phone] If 7PM-7AM, please contact night-coverage at [email or web address], password [facility] 08/14/2015, 12:30 PM  LOS: 17 days

## 2015-08-15 ENCOUNTER — Inpatient Hospital Stay (HOSPITAL_COMMUNITY): Payer: 59

## 2015-08-15 ENCOUNTER — Ambulatory Visit: Payer: 59

## 2015-08-15 ENCOUNTER — Other Ambulatory Visit: Payer: 59

## 2015-08-15 MED ORDER — LEVALBUTEROL HCL 0.63 MG/3ML IN NEBU
0.6300 mg | INHALATION_SOLUTION | Freq: Three times a day (TID) | RESPIRATORY_TRACT | Status: DC
  Administered 2015-08-15 – 2015-08-18 (×8): 0.63 mg via RESPIRATORY_TRACT
  Filled 2015-08-15 (×9): qty 3

## 2015-08-15 MED ORDER — IPRATROPIUM BROMIDE 0.02 % IN SOLN: 0.5000 mg | Freq: Three times a day (TID) | RESPIRATORY_TRACT | Status: DC

## 2015-08-15 MED ORDER — LEVALBUTEROL HCL 0.63 MG/3ML IN NEBU: 0.6300 mg | INHALATION_SOLUTION | Freq: Three times a day (TID) | RESPIRATORY_TRACT | Status: DC

## 2015-08-15 MED ORDER — IPRATROPIUM BROMIDE 0.02 % IN SOLN: 0.5000 mg | Freq: Four times a day (QID) | RESPIRATORY_TRACT | Status: DC

## 2015-08-15 MED ORDER — IPRATROPIUM BROMIDE 0.02 % IN SOLN
0.5000 mg | Freq: Three times a day (TID) | RESPIRATORY_TRACT | Status: DC
  Administered 2015-08-15 – 2015-08-18 (×8): 0.5 mg via RESPIRATORY_TRACT
  Filled 2015-08-15 (×9): qty 2.5

## 2015-08-15 NOTE — Progress Notes (Addendum)
      [address]       [place], [address]             [phone]      13 Days Post-Op Procedure(s) (LRB): VIDEO BRONCHOSCOPY WITH ATTEMPTED INSERTION OF INTERBRONCHIAL VALVE (IBV) WITH FLUROSCOPY (N/A) INSERTION OF SECOND RIGHT CHEST TUBE WITH FLUROSCOPY (Right)   Subjective:  Mr. Wachsmuth has no new complaints.  He states they left his chest tube on suction this morning when his CXR was taken.  Objective: Vital signs in last 24 hours: Temp:  [98.2 F (36.8 C)-98.4 F (36.9 C)] 98.2 F (36.8 C) (10/17 0439) Pulse Rate:  [84-101] 84 (10/17 0439) Cardiac Rhythm:  [-] Normal sinus rhythm;Sinus tachycardia (10/17 0900) Resp:  [18] 18 (10/17 0439) BP: (103-131)/(60-77) 103/60 mmHg (10/17 0439) SpO2:  [94 %-98 %] 94 % (10/17 0845)  Intake/Output from previous day: 10/16 0701 - 10/17 0700 In: 425 [P.O.:840; I.V.:3] Out: 120 [Chest Tube:120] Intake/Output this shift: Total I/O In: 240 [P.O.:240] Out: -   General appearance: alert, cooperative and no distress Heart: regular rate and rhythm Lungs: clear to auscultation bilaterally Abdomen: soft, non-tender; bowel sounds normal; no masses,  no organomegaly Wound: clean and dry  Lab Results:  Recent Labs  08/13/15 0416  WBC 9.3  HGB 12.2*  HCT 36.4*  PLT 219   BMET:  Recent Labs  08/13/15 0416  NA 137  K 3.9  CL 101  CO2 28  GLUCOSE 78  BUN 21*  CREATININE 0.95  CALCIUM 9.0    PT/INR: No results for input(s): LABPROT, INR in the last 72 hours. ABG    Component Value Date/Time   TCO2 22 04/04/2015 1839   CBG (last 3)  No results for input(s): GLUCAP in the last 72 hours.  Assessment/Plan: S/P Procedure(s) (LRB): VIDEO BRONCHOSCOPY WITH ATTEMPTED INSERTION OF INTERBRONCHIAL VALVE (IBV) WITH FLUROSCOPY (N/A) INSERTION OF SECOND RIGHT CHEST TUBE WITH FLUROSCOPY (Right)  1. Chest tube- no air leak appreciated, however CXR with increase in pneumothorax- will leave chest tube on suction  today.... Repeat CXR in AM   LOS: 18 days    BARRETT, ERIN 08/15/2015  Chest xray in am and place tube to water seal if chest xray stable I have seen and examined [name] and agree with the above assessment  and plan.  [name] [facility] [phone] Office [phone] 08/15/2015 10:54 PM

## 2015-08-15 NOTE — Progress Notes (Signed)
[facility] PROGRESS NOTE  Gabriel [name] NIO:[id number] DOB: 1941-04-13 DOA: 07/28/2015 PCP: No PCP Per Patient  Assessment/Plan:  recurrent pneumothorax  - May be secondary to history of lung cancer, COPD and ongoing tobacco abuse.  -  status post video bronchoscopy with attempted insertion of intrabronchial valve with fluoroscopy, insertion of second right chest tube 08/02/2015,  one chest tube discontinued on 10/13. - Management as per CT surgery, CXR with increase in pneumothorax, CT surgery will will leave chest tube on suction today, follow on repeat chest x-ray in a.m.  stage III non-small cell lung cancer - followed by Dr. Earlie Server of oncology. Patient status post treatment with chemotherapy and radiation therapy, carboplatin and paclitaxel with partial response. - Outpatient follow-up with oncology.   COPD with acute exacerbation -  Continue Xopenex and Atrovent scheduled nebulizers, Symbicort,  finished Levaquin treatment, finished steroid taper, finished steroid taper yesterday , with some mild wheezing on physical exam today, will increase duonebs frequency, continue to monitor off steroids.  Oral thrush - Started on Magic mouthwash.  probable HCAP - Repeat chest x-ray with persistent right basilar opacity noted consistent with atelectasis or infiltrate. -  Treated with levofloxacin  leukocytosis - Resolved   prophylaxis Lovenox for DVT prophylaxis.    Code Status: Full Family Communication: None at bedside Disposition Plan: Per CVTS   Consultants:  CVTS: Dr Servando Snare 07/28/2015  Procedures:  Video bronchoscopy with attempted insertion of intrabronchial valve with fluoroscopy insertion of second right chest tube with fluoroscopy 08/02/2015 per Dr. Servando Snare   chest x-ray 08/02/2015, 08/03/2015, 08/04/2015     Antibiotics:  IV Levaquin 08/04/2015  HPI/Subjective:  Patient feeling better.No CP. No shortness of breath  Objective: Filed Vitals:    08/15/15 0439  BP: 103/60  Pulse: 84  Temp: 98.2 F (36.8 C)  Resp: 18    Intake/Output Summary (Last 24 hours) at 08/15/15 1041 Last data filed at 08/15/15 0730  Gross per 24 hour  Intake    843 ml  Output    120 ml  Net    723 ml   Filed Weights   07/28/15 0652 07/28/15 1039  Weight: 61.236 kg (135 lb) 60.1 kg (132 lb 7.9 oz)    Exam:   General:  NAD  Cardiovascular: RRR  Respiratory: Mild Rhonchorous breath sounds on the right greater than left. Scattered wheezing  Abdomen: Soft, nontender, nondistended, positive bowel sounds.   Musculoskeletal:  no clubbing cyanosis or edema.   Data Reviewed: Basic Metabolic Panel:  Recent Labs Lab 08/09/15 0506 08/10/15 0429 08/13/15 0416  NA 136 138 137  K 3.6 3.8 3.9  CL 100* 98* 101  CO2 '29 29 28  '$ GLUCOSE 85 81 78  BUN 16 15 21*  CREATININE 0.81 0.94 0.95  CALCIUM 9.0 9.2 9.0   Liver Function Tests: No results for input(s): AST, ALT, ALKPHOS, BILITOT, PROT, ALBUMIN in the last 168 hours. No results for input(s): LIPASE, AMYLASE in the last 168 hours. No results for input(s): AMMONIA in the last 168 hours. CBC:  Recent Labs Lab 08/10/15 0429 08/13/15 0416  WBC 8.7 9.3  HGB 11.9* 12.2*  HCT 36.4* 36.4*  MCV 92.2 92.9  PLT 212 219   Cardiac Enzymes: No results for input(s): CKTOTAL, CKMB, CKMBINDEX, TROPONINI in the last 168 hours. BNP (last 3 results)  Recent Labs  04/04/15 1831 07/28/15 0720  BNP 53.8 42.7    ProBNP (last 3 results) No results for input(s): PROBNP in the last 8760 hours.  CBG: No results for input(s): GLUCAP in the last 168 hours.  No results found for this or any previous visit (from the past 240 hour(s)).   Studies: Dg Chest Port 1 View  08/15/2015  CLINICAL DATA:  Pneumothorax. EXAM: PORTABLE CHEST 1 VIEW COMPARISON:  08/14/2015 . FINDINGS: Right chest tube in stable position. Slight increase in right-sided pneumothorax. Persistent right base subsegmental atelectasis.  Cardiomegaly with normal pulmonary vascularity. IMPRESSION: 1. Right chest tube in stable position. Slight increase in right-sided pneumothorax. 2. Right base subsegmental atelectasis. 3. Cardiomegaly, no pulmonary venous congestion Electronically Signed   By: Wiggins   On: 08/15/2015 07:48   Dg Chest Port 1 View  08/14/2015  CLINICAL DATA:  Right sided pneumothorax Hx of cancer, spontaneous pneumothorax, lung mass, inguinal hernia EXAM: PORTABLE CHEST 1 VIEW COMPARISON:  One day prior FINDINGS: Midline trachea. Mild cardiomegaly. Mild right hemidiaphragm elevation. Remote left rib trauma. No pleural fluid. Right-sided chest tube remains in place. Moderate right-sided pneumothorax. Visceral pleural line measures on the order of 2.4 cm from the chest wall superiorly and laterally. Decreased from 3.6 cm at the same location on the prior. Inferolateral component is also less well-defined today. Secondary right infrahilar atelectasis. IMPRESSION: Decreased in moderate right-sided pneumothorax, right-sided chest tube remaining in place. No new findings. Electronically Signed   By: Abigail Miyamoto M.D.   On: 08/14/2015 08:55    Scheduled Meds: . budesonide-formoterol  2 puff Inhalation BID  . enoxaparin (LOVENOX) injection  40 mg Subcutaneous Q24H  . guaiFENesin  1,200 mg Oral BID  . ipratropium  0.5 mg Nebulization TID  . levalbuterol  0.63 mg Nebulization TID  . magic mouthwash  5 mL Oral QID  . polyethylene glycol  17 g Oral Daily  . sodium chloride  3 mL Intravenous Q12H  . sodium chloride  3 mL Intravenous Q12H   Continuous Infusions:   Principal Problem:   Pneumothorax Active Problems:   Pneumothorax on right   Non-small cell carcinoma of right lung, stage 3 (HCC)   COPD (chronic obstructive pulmonary disease) (HCC)   Lung cancer (HCC)   HCAP (healthcare-associated pneumonia)   COPD exacerbation (HCC)   Chest tube in place   Pneumothorax, right    Time spent: 20  minutes    Jeffery Bachmeier M.D. [facility] Pager [phone] If 7PM-7AM, please contact night-coverage at [email or web address], password [facility] 08/15/2015, 10:41 AM  LOS: 18 days

## 2015-08-16 ENCOUNTER — Inpatient Hospital Stay (HOSPITAL_COMMUNITY): Payer: 59

## 2015-08-16 NOTE — Progress Notes (Signed)
Exchanged the mini express for a new mini express because it was leaking.   Gabriel [name], Mervin Kung RN

## 2015-08-16 NOTE — Progress Notes (Signed)
[facility] PROGRESS NOTE  Gabriel [name] TGG:[id number] DOB: 11-01-1940 DOA: 07/28/2015 PCP: No PCP Per Patient  Assessment/Plan:  recurrent pneumothorax  - May be secondary to history of lung cancer, COPD and ongoing tobacco abuse.  -  status post video bronchoscopy with attempted insertion of intrabronchial valve with fluoroscopy, insertion of second right chest tube 08/02/2015,  one chest tube discontinued on 10/13. - Management as per CT surgery, CXR with no change in pneumothorax today.  stage III non-small cell lung cancer - followed by Dr. Earlie Server of oncology. Patient status post treatment with chemotherapy and radiation therapy, carboplatin and paclitaxel with partial response. - Outpatient follow-up with oncology.   COPD with acute exacerbation -  Continue Xopenex and Atrovent scheduled nebulizers, Symbicort,  finished Levaquin treatment, finished steroid taper, finished steroid taper yesterday , with some mild wheezing on physical exam today, will increase duonebs frequency, continue to monitor off steroids.  Oral thrush - Started on Magic mouthwash.  probable HCAP - Repeat chest x-ray with persistent right basilar opacity noted consistent with atelectasis or infiltrate. -  Treated with levofloxacin  leukocytosis - Resolved   prophylaxis Lovenox for DVT prophylaxis.    Code Status: Full Family Communication: None at bedside Disposition Plan: Per CVTS   Consultants:  CVTS: Dr Servando Snare 07/28/2015  Procedures:  Video bronchoscopy with attempted insertion of intrabronchial valve with fluoroscopy insertion of second right chest tube with fluoroscopy 08/02/2015 per Dr. Servando Snare   chest x-ray 08/02/2015, 08/03/2015, 08/04/2015     Antibiotics:  IV Levaquin 08/04/2015  HPI/Subjective:  Patient feeling better.No CP. No shortness of breath  Objective: Filed Vitals:   08/16/15 0432  BP: 109/64  Pulse: 82  Temp: 97.9 F (36.6 C)  Resp: 16     Intake/Output Summary (Last 24 hours) at 08/16/15 1146 Last data filed at 08/15/15 1900  Gross per 24 hour  Intake    600 ml  Output     40 ml  Net    560 ml   Filed Weights   07/28/15 0652 07/28/15 1039  Weight: 61.236 kg (135 lb) 60.1 kg (132 lb 7.9 oz)    Exam:   General:  NAD  Cardiovascular: RRR  Respiratory: Mild Rhonchorous breath sounds on the right greater than left. No wheezing  Abdomen: Soft, nontender, nondistended, positive bowel sounds.   Musculoskeletal:  no clubbing cyanosis or edema.   Data Reviewed: Basic Metabolic Panel:  Recent Labs Lab 08/10/15 0429 08/13/15 0416  NA 138 137  K 3.8 3.9  CL 98* 101  CO2 29 28  GLUCOSE 81 78  BUN 15 21*  CREATININE 0.94 0.95  CALCIUM 9.2 9.0   Liver Function Tests: No results for input(s): AST, ALT, ALKPHOS, BILITOT, PROT, ALBUMIN in the last 168 hours. No results for input(s): LIPASE, AMYLASE in the last 168 hours. No results for input(s): AMMONIA in the last 168 hours. CBC:  Recent Labs Lab 08/10/15 0429 08/13/15 0416  WBC 8.7 9.3  HGB 11.9* 12.2*  HCT 36.4* 36.4*  MCV 92.2 92.9  PLT 212 219   Cardiac Enzymes: No results for input(s): CKTOTAL, CKMB, CKMBINDEX, TROPONINI in the last 168 hours. BNP (last 3 results)  Recent Labs  04/04/15 1831 07/28/15 0720  BNP 53.8 42.7    ProBNP (last 3 results) No results for input(s): PROBNP in the last 8760 hours.  CBG: No results for input(s): GLUCAP in the last 168 hours.  No results found for this or any previous visit (from the past  240 hour(s)).   Studies: Dg Chest Port 1 View  08/16/2015  CLINICAL DATA:  Chest chest tube.  Pneumothorax. EXAM: PORTABLE CHEST 1 VIEW COMPARISON:  08/15/2015 . FINDINGS: Right chest tube in stable position. Persistent right-sided small pneumothorax, no change. Mediastinum and hilar structures are normal. Mild cardiomegaly. No pleural effusion. No acute bony abnormality . IMPRESSION: 1. Right chest tube in  stable position. Persistent unchanged small right pneumothorax. 2.  Right base atelectasis and/or infiltrate. Electronically Signed   By: Marcello Moores  Register   On: 08/16/2015 08:22   Dg Chest Port 1 View  08/15/2015  CLINICAL DATA:  Pneumothorax. EXAM: PORTABLE CHEST 1 VIEW COMPARISON:  08/14/2015 . FINDINGS: Right chest tube in stable position. Slight increase in right-sided pneumothorax. Persistent right base subsegmental atelectasis. Cardiomegaly with normal pulmonary vascularity. IMPRESSION: 1. Right chest tube in stable position. Slight increase in right-sided pneumothorax. 2. Right base subsegmental atelectasis. 3. Cardiomegaly, no pulmonary venous congestion Electronically Signed   By: Marcello Moores  Register   On: 08/15/2015 07:48    Scheduled Meds: . budesonide-formoterol  2 puff Inhalation BID  . enoxaparin (LOVENOX) injection  40 mg Subcutaneous Q24H  . guaiFENesin  1,200 mg Oral BID  . ipratropium  0.5 mg Nebulization TID  . levalbuterol  0.63 mg Nebulization TID  . magic mouthwash  5 mL Oral QID  . polyethylene glycol  17 g Oral Daily  . sodium chloride  3 mL Intravenous Q12H  . sodium chloride  3 mL Intravenous Q12H   Continuous Infusions:   Principal Problem:   Pneumothorax Active Problems:   Pneumothorax on right   Non-small cell carcinoma of right lung, stage 3 (HCC)   COPD (chronic obstructive pulmonary disease) (HCC)   Lung cancer (HCC)   HCAP (healthcare-associated pneumonia)   COPD exacerbation (HCC)   Chest tube in place   Pneumothorax, right    Time spent: 20 minutes    ELGERGAWY, DAWOOD M.D. [facility] Pager [phone] If 7PM-7AM, please contact night-coverage at [email or web address], password [facility] 08/16/2015, 11:46 AM  LOS: 19 days

## 2015-08-16 NOTE — Progress Notes (Addendum)
      [address]       [place] [address]             [phone]      14 Days Post-Op Procedure(s) (LRB): VIDEO BRONCHOSCOPY WITH ATTEMPTED INSERTION OF INTERBRONCHIAL VALVE (IBV) WITH FLUROSCOPY (N/A) INSERTION OF SECOND RIGHT CHEST TUBE WITH FLUROSCOPY (Right) Subjective: Feels ok  Objective: Vital signs in last 24 hours: Temp:  [97.5 F (36.4 C)-98.1 F (36.7 C)] 97.9 F (36.6 C) (10/18 0432) Pulse Rate:  [82-95] 82 (10/18 0432) Cardiac Rhythm:  [-] Normal sinus rhythm (10/17 1910) Resp:  [16-18] 16 (10/18 0432) BP: (109-137)/(64-82) 109/64 mmHg (10/18 0432) SpO2:  [94 %-98 %] 96 % (10/18 0432)  Hemodynamic parameters for last 24 hours:    Intake/Output from previous day: 10/17 0701 - 10/18 0700 In: 840 [P.O.:840] Out: 40 [Chest Tube:40] Intake/Output this shift:    General appearance: alert, cooperative and no distress Heart: regular rate and rhythm Lungs: coarse BS  Lab Results: No results for input(s): WBC, HGB, HCT, PLT in the last 72 hours. BMET: No results for input(s): NA, K, CL, CO2, GLUCOSE, BUN, CREATININE, CALCIUM in the last 72 hours.  PT/INR: No results for input(s): LABPROT, INR in the last 72 hours. ABG    Component Value Date/Time   TCO2 22 04/04/2015 1839   CBG (last 3)  No results for input(s): GLUCAP in the last 72 hours.  Meds Scheduled Meds: . budesonide-formoterol  2 puff Inhalation BID  . enoxaparin (LOVENOX) injection  40 mg Subcutaneous Q24H  . guaiFENesin  1,200 mg Oral BID  . ipratropium  0.5 mg Nebulization TID  . levalbuterol  0.63 mg Nebulization TID  . magic mouthwash  5 mL Oral QID  . polyethylene glycol  17 g Oral Daily  . sodium chloride  3 mL Intravenous Q12H  . sodium chloride  3 mL Intravenous Q12H   Continuous Infusions:  PRN Meds:.acetaminophen **OR** acetaminophen, ALPRAZolam, alum & mag hydroxide-simeth, docusate sodium, ipratropium, levalbuterol, menthol-cetylpyridinium, morphine  injection, ondansetron **OR** ondansetron (ZOFRAN) IV, oxyCODONE, sodium chloride  Xrays Dg Chest Port 1 View  08/15/2015  CLINICAL DATA:  Pneumothorax. EXAM: PORTABLE CHEST 1 VIEW COMPARISON:  08/14/2015 . FINDINGS: Right chest tube in stable position. Slight increase in right-sided pneumothorax. Persistent right base subsegmental atelectasis. Cardiomegaly with normal pulmonary vascularity. IMPRESSION: 1. Right chest tube in stable position. Slight increase in right-sided pneumothorax. 2. Right base subsegmental atelectasis. 3. Cardiomegaly, no pulmonary venous congestion Electronically Signed   By: [place]   On: 08/15/2015 07:48   CT- + air leak   Assessment/Plan: S/P Procedure(s) (LRB): VIDEO BRONCHOSCOPY WITH ATTEMPTED INSERTION OF INTERBRONCHIAL VALVE (IBV) WITH FLUROSCOPY (N/A) INSERTION OF SECOND RIGHT CHEST TUBE WITH FLUROSCOPY (Right)  1 CXR appears about the same , not read by radiologist yet- poss change to H20 seal     LOS: 19 days    GOLD,[name] 08/16/2015  Chest xray stable , to water seal today, poss home tomorrow. I have seen and examined [name] and agree with the above assessment  and plan.  [name] Beeper [phone] Office [phone] 08/16/2015 12:18 PM

## 2015-08-17 ENCOUNTER — Inpatient Hospital Stay (HOSPITAL_COMMUNITY): Payer: 59

## 2015-08-17 DIAGNOSIS — J441 Chronic obstructive pulmonary disease with (acute) exacerbation: Secondary | ICD-10-CM

## 2015-08-17 DIAGNOSIS — C3491 Malignant neoplasm of unspecified part of right bronchus or lung: Secondary | ICD-10-CM

## 2015-08-17 DIAGNOSIS — Z789 Other specified health status: Secondary | ICD-10-CM

## 2015-08-17 DIAGNOSIS — J939 Pneumothorax, unspecified: Secondary | ICD-10-CM

## 2015-08-17 DIAGNOSIS — J189 Pneumonia, unspecified organism: Secondary | ICD-10-CM

## 2015-08-17 LAB — BASIC METABOLIC PANEL
Anion gap: 9 (ref 5–15)
BUN: 14 mg/dL (ref 6–20)
CO2: 28 mmol/L (ref 22–32)
CREATININE: 0.91 mg/dL (ref 0.61–1.24)
Calcium: 9.1 mg/dL (ref 8.9–10.3)
Chloride: 101 mmol/L (ref 101–111)
GFR calc Af Amer: >60 mL/min (ref >60)
Glucose, Bld: 92 mg/dL (ref 65–99)
POTASSIUM: 4 mmol/L (ref 3.5–5.1)
SODIUM: 138 mmol/L (ref 135–145)

## 2015-08-17 LAB — CBC
HCT: 37.1 % — ABNORMAL LOW (ref 39.0–52.0)
Hemoglobin: 12.1 g/dL — ABNORMAL LOW (ref 13.0–17.0)
MCH: 30.3 pg (ref 26.0–34.0)
MCHC: 32.6 g/dL (ref 30.0–36.0)
MCV: 93 fL (ref 78.0–100.0)
PLATELETS: 224 10*3/uL (ref 150–400)
RBC: 3.99 MIL/uL — AB (ref 4.22–5.81)
RDW: 15.4 % (ref 11.5–15.5)
WBC: 8.5 10*3/uL (ref 4.0–10.5)

## 2015-08-17 NOTE — Progress Notes (Signed)
Nursing note Patient ambulated in hallway 250 feet x1 assist. Sitting on side of the bed pt on room air 94-96% Pt on room air sats ranging from 95-97 while ambulating. Will monitor patient. Loyalty Brashier, Bettina Gavia RN

## 2015-08-17 NOTE — Progress Notes (Signed)
PROGRESS NOTE    KOLLEN ARMENTI FUX:[id number] DOB: 05-Oct-1941 DOA: 07/28/2015 PCP: No PCP Per Patient  HPI/Brief narrative 74 year old male with history of stage III NSCLC (T2a, N2, M0), undergoing chemoradiation at the cancer center-carboplatin and paclitaxel with partial response, last seen by Dr. Julien Nordmann on 07/25/15 and was supposed to start treatment with carboplatin and gemcitabine on 08/08/15, prior history of right sided pneumothorax 04/04/15 requiring chest tube placement, admitted to [facility] on 07/28/15 with severe dyspnea. Chest x-ray in ED confirmed right-sided pneumothorax. Thoracic surgery consulted and chest tube was placed in the ED. They performed video bronchoscopy with attempted insertion of interbronchial valve with fluoroscopy and insertion of second right chest tube on 08/17/15. Since then his pneumothorax has not completely resolved and thoracic surgeons continue to manage. He has completed treatment for presumed pneumonia and COPD exacerbation.   Assessment/Plan:  Recurrent pneumothorax - Management per cardiothoracic surgery - May be secondary to history of lung cancer, COPD and ongoing tobacco abuse - s/p video bronchoscopy with attempted insertion of interbronchial valve with fluoroscopy and insertion of second right chest tube with fluoroscopy on 08/17/15 - Patient feels fine but chest x-ray shows slightly worsened pneumothorax  Stage III non-small cell lung cancer - Follows with Dr. Curt Bears, oncology - Status post treatment with chemotherapy and radiation therapy. Carboplatin and paclitaxel with partial response - Outpatient follow-up with oncology  COPD exacerbation - Completed antibiotics and steroid taper - Exacerbation resolved. Continue bronchodilator nebulizations, Symbicort  Oral thrush - Improved on magic mouthwash  Probable healthcare associated pneumonia - Completed treatment with levofloxacin    DVT prophylaxis: Lovenox Code Status:  Full Family Communication: None at bedside Disposition Plan: DC home when cleared by thoracic surgery   Consultants:  Cardiothoracic surgery: Dr. Servando Snare  Procedures:  VIDEO BRONCHOSCOPY WITH ATTEMPTED INSERTION OF INTERBRONCHIAL VALVE (IBV) WITH FLUROSCOPY (N/A) 08/17/15  INSERTION OF SECOND RIGHT CHEST TUBE WITH FLUROSCOPY (Right) 08/17/15  Antibiotics:  Levofloxacin 10/6 >10/13   Subjective: Denies complaints. Denied chest pain or dyspnea.  Objective: Filed Vitals:   08/16/15 2009 08/16/15 2118 08/17/15 0453 08/17/15 0838  BP:  116/72 97/68   Pulse:  96 84   Temp:  97.2 F (36.2 C) 97.6 F (36.4 C)   TempSrc:  Oral Oral   Resp:  18 20   Height:      Weight:      SpO2: 99% 97% 97% 93%    Intake/Output Summary (Last 24 hours) at 08/17/15 1403 Last data filed at 08/17/15 0900  Gross per 24 hour  Intake    720 ml  Output      0 ml  Net    720 ml   Filed Weights   07/28/15 0652 07/28/15 1039  Weight: 61.236 kg (135 lb) 60.1 kg (132 lb 7.9 oz)     Exam:  General exam: Pleasant elderly male sitting up comfortably at edge of bed this morning eating breakfast. Respiratory system: Clear to auscultation. No increased work of breathing. Right-sided chest tube connected to underwater seal. Cardiovascular system: S1 & S2 heard, RRR. No JVD, murmurs, gallops, clicks or pedal edema. Telemetry: Sinus rhythm. Gastrointestinal system: Abdomen is nondistended, soft and nontender. Normal bowel sounds heard. Central nervous system: Alert and oriented. No focal neurological deficits. Extremities: Symmetric 5 x 5 power.   Data Reviewed: Basic Metabolic Panel:  Recent Labs Lab 08/13/15 0416 08/17/15 0322  NA 137 138  K 3.9 4.0  CL 101 101  CO2 28 28  GLUCOSE  78 92  BUN 21* 14  CREATININE 0.95 0.91  CALCIUM 9.0 9.1   Liver Function Tests: No results for input(s): AST, ALT, ALKPHOS, BILITOT, PROT, ALBUMIN in the last 168 hours. No results for input(s): LIPASE,  AMYLASE in the last 168 hours. No results for input(s): AMMONIA in the last 168 hours. CBC:  Recent Labs Lab 08/13/15 0416 08/17/15 0322  WBC 9.3 8.5  HGB 12.2* 12.1*  HCT 36.4* 37.1*  MCV 92.9 93.0  PLT 219 224   Cardiac Enzymes: No results for input(s): CKTOTAL, CKMB, CKMBINDEX, TROPONINI in the last 168 hours. BNP (last 3 results) No results for input(s): PROBNP in the last 8760 hours. CBG: No results for input(s): GLUCAP in the last 168 hours.  No results found for this or any previous visit (from the past 240 hour(s)).        Studies: Dg Chest 2 View  08/17/2015  CLINICAL DATA:  Right chest tube, history of spontaneous pneumothorax EXAM: CHEST  2 VIEW COMPARISON:  Portable chest x-ray of 08/16/2015 FINDINGS: There is little change to perhaps slight increase in volume of the right pneumothorax with right basilar chest tube remaining. There is a small right pleural effusion indicating right hydro pneumothorax. Atelectasis at the right lung base is stable. The left lung is clear. Heart size is unchanged. IMPRESSION: 1. Slight increase in volume of the right hydro pneumothorax with right chest tube remaining at the right base. 2. Increase in right basilar atelectasis. Electronically Signed   By: Ivar Drape M.D.   On: 08/17/2015 08:14   Dg Chest Port 1 View  08/16/2015  CLINICAL DATA:  Chest chest tube.  Pneumothorax. EXAM: PORTABLE CHEST 1 VIEW COMPARISON:  08/15/2015 . FINDINGS: Right chest tube in stable position. Persistent right-sided small pneumothorax, no change. Mediastinum and hilar structures are normal. Mild cardiomegaly. No pleural effusion. No acute bony abnormality . IMPRESSION: 1. Right chest tube in stable position. Persistent unchanged small right pneumothorax. 2.  Right base atelectasis and/or infiltrate. Electronically Signed   By: Marcello Moores  Register   On: 08/16/2015 08:22        Scheduled Meds: . budesonide-formoterol  2 puff Inhalation BID  . enoxaparin  (LOVENOX) injection  40 mg Subcutaneous Q24H  . guaiFENesin  1,200 mg Oral BID  . ipratropium  0.5 mg Nebulization TID  . levalbuterol  0.63 mg Nebulization TID  . magic mouthwash  5 mL Oral QID  . polyethylene glycol  17 g Oral Daily  . sodium chloride  3 mL Intravenous Q12H  . sodium chloride  3 mL Intravenous Q12H   Continuous Infusions:   Principal Problem:   Pneumothorax Active Problems:   Pneumothorax on right   Non-small cell carcinoma of right lung, stage 3 (HCC)   COPD (chronic obstructive pulmonary disease) (HCC)   Lung cancer (HCC)   HCAP (healthcare-associated pneumonia)   COPD exacerbation (HCC)   Chest tube in place   Pneumothorax, right    Time spent: 20 minutes    HONGALGI,ANAND, MD, FACP, FHM. [facility] Pager [phone]  If 7PM-7AM, please contact night-coverage [email or web address] Password [email or web address] 08/17/2015, 2:03 PM    LOS: 20 days

## 2015-08-17 NOTE — Progress Notes (Addendum)
15 Days Post-Op Procedure(s) (LRB): VIDEO BRONCHOSCOPY WITH ATTEMPTED INSERTION OF INTERBRONCHIAL VALVE (IBV) WITH FLUROSCOPY (N/A) INSERTION OF SECOND RIGHT CHEST TUBE WITH FLUROSCOPY (Right) Subjective: Appears to be clinically tolerating   Objective: Vital signs in last 24 hours: Temp:  [97.2 F (36.2 C)-97.6 F (36.4 C)] 97.6 F (36.4 C) (10/19 0453) Pulse Rate:  [84-96] 84 (10/19 0453) Cardiac Rhythm:  [-] Normal sinus rhythm (10/19 0728) Resp:  [18-20] 20 (10/19 0453) BP: (97-116)/(68-72) 97/68 mmHg (10/19 0453) SpO2:  [93 %-99 %] 93 % (10/19 0838)  Hemodynamic parameters for last 24 hours:    Intake/Output from previous day: 10/18 0701 - 10/19 0700 In: 480 [P.O.:480] Out: -  Intake/Output this shift:    General appearance: alert, cooperative and no distress Heart: regular rate and rhythm Lungs: mildly coarse, improved  Lab Results:  Recent Labs  08/17/15 0322  WBC 8.5  HGB 12.1*  HCT 37.1*  PLT 224   BMET:  Recent Labs  08/17/15 0322  NA 138  K 4.0  CL 101  CO2 28  GLUCOSE 92  BUN 14  CREATININE 0.91  CALCIUM 9.1    PT/INR: No results for input(s): LABPROT, INR in the last 72 hours. ABG    Component Value Date/Time   TCO2 22 04/04/2015 1839   CBG (last 3)  No results for input(s): GLUCAP in the last 72 hours.  Meds Scheduled Meds: . budesonide-formoterol  2 puff Inhalation BID  . enoxaparin (LOVENOX) injection  40 mg Subcutaneous Q24H  . guaiFENesin  1,200 mg Oral BID  . ipratropium  0.5 mg Nebulization TID  . levalbuterol  0.63 mg Nebulization TID  . magic mouthwash  5 mL Oral QID  . polyethylene glycol  17 g Oral Daily  . sodium chloride  3 mL Intravenous Q12H  . sodium chloride  3 mL Intravenous Q12H   Continuous Infusions:  PRN Meds:.acetaminophen **OR** acetaminophen, ALPRAZolam, alum & mag hydroxide-simeth, docusate sodium, ipratropium, levalbuterol, menthol-cetylpyridinium, morphine injection, ondansetron **OR** ondansetron  (ZOFRAN) IV, oxyCODONE, sodium chloride  Xrays Dg Chest 2 View  08/17/2015  CLINICAL DATA:  Right chest tube, history of spontaneous pneumothorax EXAM: CHEST  2 VIEW COMPARISON:  Portable chest x-ray of 08/16/2015 FINDINGS: There is little change to perhaps slight increase in volume of the right pneumothorax with right basilar chest tube remaining. There is a small right pleural effusion indicating right hydro pneumothorax. Atelectasis at the right lung base is stable. The left lung is clear. Heart size is unchanged. IMPRESSION: 1. Slight increase in volume of the right hydro pneumothorax with right chest tube remaining at the right base. 2. Increase in right basilar atelectasis. Electronically Signed   By: Ivar Drape M.D.   On: 08/17/2015 08:14   Dg Chest Port 1 View  08/16/2015  CLINICAL DATA:  Chest chest tube.  Pneumothorax. EXAM: PORTABLE CHEST 1 VIEW COMPARISON:  08/15/2015 . FINDINGS: Right chest tube in stable position. Persistent right-sided small pneumothorax, no change. Mediastinum and hilar structures are normal. Mild cardiomegaly. No pleural effusion. No acute bony abnormality . IMPRESSION: 1. Right chest tube in stable position. Persistent unchanged small right pneumothorax. 2.  Right base atelectasis and/or infiltrate. Electronically Signed   By: Marcello Moores  Register   On: 08/16/2015 08:22    Chest tube-+ air leak   Assessment/Plan: S/P Procedure(s) (LRB): VIDEO BRONCHOSCOPY WITH ATTEMPTED INSERTION OF INTERBRONCHIAL VALVE (IBV) WITH FLUROSCOPY (N/A) INSERTION OF SECOND RIGHT CHEST TUBE WITH FLUROSCOPY (Right)  1 appears to be clinically tolerating pntx  with mini-express on H20 seal, CXR slightly worse     LOS: 20 days    GOLD,[name] 08/17/2015  Poss home tomorrow I have seen and examined [name] and agree with the above assessment  and plan.  [name] Beeper [phone] Office [phone] 08/17/2015 6:27 PM

## 2015-08-17 NOTE — Progress Notes (Signed)
Utilization review completed.  

## 2015-08-17 NOTE — Progress Notes (Signed)
Telemetry Monitor D/cd as ordered will monitor patient. Laderius Valbuena, Bettina Gavia RN

## 2015-08-18 ENCOUNTER — Inpatient Hospital Stay (HOSPITAL_COMMUNITY): Payer: 59

## 2015-08-18 MED ORDER — ALPRAZOLAM 0.5 MG PO TABS: 0.5000 mg | ORAL_TABLET | Freq: Every day | ORAL | Status: DC | PRN

## 2015-08-18 MED ORDER — BUDESONIDE-FORMOTEROL FUMARATE 160-4.5 MCG/ACT IN AERO: 2.0000 | INHALATION_SPRAY | Freq: Two times a day (BID) | RESPIRATORY_TRACT | Status: DC

## 2015-08-18 MED ORDER — ACETAMINOPHEN 325 MG PO TABS: 650.0000 mg | ORAL_TABLET | Freq: Four times a day (QID) | ORAL | Status: DC | PRN

## 2015-08-18 MED ORDER — OXYCODONE HCL 5 MG PO TABS: 5.0000 mg | ORAL_TABLET | Freq: Three times a day (TID) | ORAL | Status: DC | PRN

## 2015-08-18 NOTE — Care Management Note (Signed)
Case Management Note [name] RN, BSN Unit 2W-Case Manager [phone]  Patient Details  Name: Gabriel [name] MRN: [id number] Date of Birth: 08/18/1941  Subjective/Objective:   Pt admitted with spont. pntx- with chest tube placement-                  Action/Plan: PTA pt lived at home- independent- anticipate return home when medically stable- per MD note today- may need to go home with CT to mini express- spoke with pt at bedside regarding possible [place] needs- list for [facility] agencies of [place] provided to pt for review- per pt choice he has chosen [facility] for [facility] service needs- referral called to Santiago Glad with [facility] for possible HH-RN needs for CT(mini express) referral accepted and they will follow  - will need MD order for HH-RN prior to discharge.  Expected Discharge Date:                 Expected Discharge Plan:  [place]  In-House Referral:  NA  Discharge planning Services  CM Consult  Post Acute Care Choice:  Home Health Choice offered to:  Patient  DME Arranged:    DME Agency:     HH Arranged:  RN [place] Agency:  [place]  Status of Service:  Completed, signed off  Medicare Important Message Given:    Date Medicare IM Given:    Medicare IM give by:    Date Additional Medicare IM Given:    Additional Medicare Important Message give by:     If discussed at [place] of Stay Meetings, dates discussed:  08/11/15, 08/16/15, 08/18/15  Additional Comments:  08/18/15- [name] RN, BSN - pt for d/c home today with chest tube- alerted Santiago Glad with [facility] of discharge- order for HH-RN has been placed- spoke with pt at bedside- regarding PCP- per MD he would like pt to be seen next week -  Per conversation with pt he has a couple of doctors in mind wants to speak with a few friends- gave pt [facility] # if needed for assistance- encouraged pt to go ahead a call in next 1-2 days for new pt appointment with a practice as sometimes appointments  are not quick- pt also has a f/u appointment with surgery next week and encourage pt to f/u with any pain medication needs with surgeon if chest tube remain in place. Pt voices understanding and states that he will f/u with getting an appointment with PCP- offered pt an appointment with [facility] clinic to transition while finding PCP- pt declined stating that he would prefer to go ahead and make appointment with whom ever he is going to establish with- pt's daughter present during conversation with pt's permission.   Dawayne Patricia, RN 08/18/2015, 12:15 PM

## 2015-08-18 NOTE — Discharge Instructions (Addendum)
Chest Tube, Care After Refer to this sheet in the next few weeks. These instructions provide you with information on caring for yourself after your procedure. Your health care provider may also give you more specific instructions. Your treatment has been planned according to current medical practices, but problems sometimes occur. Call your health care provider if you have any problems or questions after your procedure.  WHAT TO EXPECT AFTER THE PROCEDURE After the procedure, you will have a thin tube that passes through the skin between your ribs and into your chest. It is normal to have some pain or discomfort at the site of the chest tube insertion.  HOME CARE INSTRUCTIONS If you go home with a chest tube still in place, follow these instructions:   Be careful to avoid any activity that may cause your chest tube to become dislodged.  Keep two clamps nearby. If any tube gets disconnected, clamp the tubing closest to your body with both clamps. Call your health care provider for directions. You may need to go to the emergency department if the problem cannot be solved.   Check the tubing often to make sure it is not kinked. When the tubing is kinked, draining will not take place properly, and you may have some trouble breathing.   Take these steps if the chest tube falls out:   Do not panic.   Open a package of gauze coated with petroleum jelly.   Open a package of dry, square gauze.   Cover the wound first with the petroleum jelly gauze and then cover that with the dry, square gauze.   Tape the dry, square gauze in place.   After you have covered the site, call your health care provider for instructions. Your health care provider will decide if you need to go to the emergency department.   You may shower with the chest tube in place if your health care provider approves. Cover the area where the chest tube comes out with a small plastic bag and tape it well. Bring the drainage  device to the shower area and leave it outside the shower curtain or door. The unit should never be under the direct flow of water.   Perform gentle exercise such as walking as directed by your health care provider. Talk to your health care provider about any other activity or exercise you want to do.   Only take over-the-counter or prescription medicines as directed by your health care provider.   Follow up with your health care provider as directed. SEEK MEDICAL CARE IF:  You have redness or swelling at the incision.   Your incision has opened (the edges are not staying together).  You have drainage from the incision area.   Your drainage from the chest tube changes color or becomes red or bloody.   Your pain is not controlled with the medicines prescribed.  SEEK IMMEDIATE MEDICAL CARE IF:  You have a fever.   You have any new trouble with breathing.   You develop any chest pain.   You develop unusual sweating.  You have weakness.  You feel lightheaded, or you faint.  Your connecting tubes become disconnected.  You develop red streaking of the skin that extends above or below the incision.  Your chest tube falls out.    This information is not intended to replace advice given to you by your health care provider. Make sure you discuss any questions you have with your health care provider.   Document  Released: 08/05/2013 Document Reviewed: 08/05/2013 Elsevier Interactive Patient Education 2016 [employer].  Pneumothorax A pneumothorax, commonly called a collapsed lung, is a condition in which air leaks from a lung and builds up in the space between the lung and the chest wall (pleural space). The air in a pneumothorax is trapped outside the lung and takes up space, preventing the lung from fully expanding. This is a condition that usually occurs suddenly. The buildup of air may be small or large. A small pneumothorax may go away on its own. When a pneumothorax  is larger, it will often require medical treatment and hospitalization.  CAUSES  A pneumothorax can sometimes happen quickly with no apparent cause. People with underlying lung problems, particularly COPD or emphysema, are at higher risk of pneumothorax. However, pneumothorax can happen quickly even in people with no prior known lung problems. Trauma, surgery, medical procedures, or injury to the chest wall can also cause a pneumothorax. SIGNS AND SYMPTOMS  Sometimes a pneumothorax will have no symptoms. When symptoms are present, they can include:  Chest pain.  Shortness of breath.  Increased rate of breathing.  Bluish color to your lips or skin (cyanosis). DIAGNOSIS  Pneumothorax is usually diagnosed by a chest X-ray or chest CT scan. Your health care provider will also take a medical history and perform a physical exam to determine why you may have a pneumothorax. TREATMENT  A small pneumothorax may go away on its own without treatment. Extra oxygen can sometimes help a small pneumothorax go away more quickly. For a larger pneumothorax or a pneumothorax that is causing symptoms, a procedure is usually needed to drain the air.In some cases, the health care provider may drain the air using a needle. In other cases, a chest tube may be inserted into the pleural space. A chest tube is a small tube placed between the ribs and into the pleural space. This removes the extra air and allows the lung to expand back to its normal size. A large pneumothorax will usually require a hospital stay. If there is ongoing air leakage into the pleural space, then the chest tube may need to remain in place for several days until the air leak has healed. In some cases, surgery may be needed.  HOME CARE INSTRUCTIONS   Only take over-the-counter or prescription medicines as directed by your health care provider.  If a cough or pain makes it difficult for you to sleep at night, try sleeping in a semi-upright position  in a recliner or by using 2 or 3 pillows.  Rest and limit activity as directed by your health care provider.  If you had a chest tube and it was removed, ask your health care provider when it is okay to remove the dressing. Until your health care provider says you can remove the dressing, do not allow it to get wet.  Do not smoke. Smoking is a risk factor for pneumothorax.  Do not fly in an airplane or scuba dive until your health care provider says it is okay.  Follow up with your health care provider as directed. SEEK IMMEDIATE MEDICAL CARE IF:   You have increasing chest pain or shortness of breath.  You have a cough that is not controlled with suppressants.  You begin coughing up blood.  You have pain that is getting worse or is not controlled with medicines.  You cough up thick, discolored mucus (sputum) that is yellow to green in color.  You have redness, increasing pain,  or discharge at the site where a chest tube had been in place (if your pneumothorax was treated with a chest tube).  The site where your chest tube was located opens up.  You feel air coming out of the site where the chest tube was placed.  You have a fever or persistent symptoms for more than 2-3 days.  You have a fever and your symptoms suddenly get worse. MAKE SURE YOU:   Understand these instructions.  Will watch your condition.  Will get help right away if you are not doing well or get worse.   This information is not intended to replace advice given to you by your health care provider. Make sure you discuss any questions you have with your health care provider.   Document Released: 10/15/2005 Document Revised: 08/05/2013 Document Reviewed: 05/14/2013 Elsevier Interactive Patient Education 2016 [employer].  Pain Medicine Instructions  HOW CAN PAIN MEDICINE AFFECT ME?    You were given a prescription for pain medicine. This medicine may make you tired or drowsy and may affect your  ability to think clearly. Pain medicine may also affect your ability to drive or perform certain physical activities. It may not be possible to make all of your pain go away, but you should be comfortable enough to move, breathe, and take care of yourself.  HOW OFTEN SHOULD I TAKE PAIN MEDICINE AND HOW MUCH SHOULD I TAKE?  Take pain medicine only as directed by your health care provider and only as needed for pain.  You do not need to take pain medicine if you are not having pain, unless directed by your health care provider.  You can take less than the prescribed dose if you find that a smaller amount of medicine controls your pain. WHAT RESTRICTIONS DO I HAVE WHILE TAKING PAIN MEDICINE?  Follow these instructions after you start taking pain medicine, while you are taking the medicine, and for 8 hours after you stop taking the medicine:  Do not drive.  Do not operate machinery.  Do not operate power tools.  Do not sign legal documents.  Do not drink alcohol.  Do not take sleeping pills.  Do not supervise children by yourself.  Do not participate in activities that require climbing or being in high places.  Do not enter a body of water--such as a lake, river, ocean, spa, or swimming pool--without an adult nearby who can monitor and help you. HOW CAN I KEEP OTHERS SAFE WHILE I AM TAKING PAIN MEDICINE?  Store your pain medicine as directed by your health care provider. Make sure that it is placed where children and pets cannot reach it.  Never share your pain medicine with anyone.  Do not save any leftover pills. If you have any leftover pain medicine, get rid of it or destroy it as directed by your health care provider. WHAT ELSE DO I NEED TO KNOW ABOUT TAKING PAIN MEDICINE?  Use a stool softener if you become constipated from your pain medicine. Increasing your intake of fruits and vegetables will also help with constipation.  Write down the times when you take your pain medicine. Look at the  times before you take your next dose of medicine. It is easy to become confused while on pain medicine. Recording the times helps you to avoid an overdose.  If your pain is severe, do not try to treat it yourself by taking more pills than instructed on your prescription. Contact your health care provider for help.  You may have been prescribed a pain medicine that contains acetaminophen. Do not take any other acetaminophen while taking this medicine. An overdose of acetaminophen can result in severe liver damage. Acetaminophen is found in many over-the-counter (OTC) and prescription medicines. If you are taking any medicines in addition to your pain medicine, check the active ingredients on those medicines to see if acetaminophen is listed. WHEN SHOULD I CALL MY HEALTH CARE PROVIDER?  Your medicine is not helping to make the pain go away.  You vomit or have diarrhea shortly after taking the medicine.  You develop new pain in areas that did not hurt before.  You have an allergic reaction to your medicine. This may include:  Itchiness.  Swelling.  Dizziness.  Developing a new rash. WHEN SHOULD I CALL 911 OR GO TO THE EMERGENCY ROOM?  You feel dizzy or you faint.  You are very confused or disoriented.  You repeatedly vomit.  Your skin or lips turn pale or bluish in color.  You have shortness of breath or you are breathing much more slowly than usual.  You have a severe allergic reaction to your medicine. This includes:  Developing tongue swelling.  Having difficulty breathing. This information is not intended to replace advice given to you by your health care provider. Make sure you discuss any questions you have with your health care provider.  Document Released: 01/21/2001 Document Revised: 03/01/2015 Document Reviewed: 08/19/2014  Elsevier Interactive Patient Education 2016 [place] while you're taking Xanax/alprazolam Do not drive.  Do not operate machinery.  Do not  operate power tools.  Do not sign legal documents.  Do not drink alcohol.  Do not take sleeping pills.  Do not supervise children by yourself.  Do not participate in activities that require climbing or being in high places.  Do not enter a body of water--such as a lake, river, ocean, spa, or swimming pool--without an adult nearby who can monitor and help you.

## 2015-08-18 NOTE — Progress Notes (Addendum)
[address]       [place] [address]             [phone]      16 Days Post-Op Procedure(s) (LRB): VIDEO BRONCHOSCOPY WITH ATTEMPTED INSERTION OF INTERBRONCHIAL VALVE (IBV) WITH FLUROSCOPY (N/A) INSERTION OF SECOND RIGHT CHEST TUBE WITH FLUROSCOPY (Right) Subjective: Looks and feels ok  Objective: Vital signs in last 24 hours: Temp:  [97.5 F (36.4 C)-97.8 F (36.6 C)] 97.8 F (36.6 C) (10/20 0518) Pulse Rate:  [86-110] 86 (10/20 0518) Cardiac Rhythm:  [-]  Resp:  [18-20] 18 (10/20 0518) BP: (101-135)/(67-82) 101/67 mmHg (10/20 0518) SpO2:  [93 %-98 %] 95 % (10/20 0518)  Hemodynamic parameters for last 24 hours:    Intake/Output from previous day: 10/19 0701 - 10/20 0700 In: 240 [P.O.:240] Out: 30 [Chest Tube:30] Intake/Output this shift:    General appearance: alert, cooperative and no distress Heart: regular rate and rhythm Lungs: clear to auscultation bilaterally Wound: dressing CDI  Lab Results:  Recent Labs  08/17/15 0322  WBC 8.5  HGB 12.1*  HCT 37.1*  PLT 224   BMET:  Recent Labs  08/17/15 0322  NA 138  K 4.0  CL 101  CO2 28  GLUCOSE 92  BUN 14  CREATININE 0.91  CALCIUM 9.1    PT/INR: No results for input(s): LABPROT, INR in the last 72 hours. ABG    Component Value Date/Time   TCO2 22 04/04/2015 1839   CBG (last 3)  No results for input(s): GLUCAP in the last 72 hours.  Meds Scheduled Meds: . budesonide-formoterol  2 puff Inhalation BID  . enoxaparin (LOVENOX) injection  40 mg Subcutaneous Q24H  . guaiFENesin  1,200 mg Oral BID  . ipratropium  0.5 mg Nebulization TID  . levalbuterol  0.63 mg Nebulization TID  . magic mouthwash  5 mL Oral QID  . polyethylene glycol  17 g Oral Daily  . sodium chloride  3 mL Intravenous Q12H  . sodium chloride  3 mL Intravenous Q12H   Continuous Infusions:  PRN Meds:.acetaminophen **OR** [DISCONTINUED] acetaminophen, ALPRAZolam, alum & mag hydroxide-simeth, docusate  sodium, levalbuterol, menthol-cetylpyridinium, morphine injection, ondansetron **OR** [DISCONTINUED] ondansetron (ZOFRAN) IV, oxyCODONE, sodium chloride  Xrays Dg Chest 2 View  08/17/2015  CLINICAL DATA:  Right chest tube, history of spontaneous pneumothorax EXAM: CHEST  2 VIEW COMPARISON:  Portable chest x-ray of 08/16/2015 FINDINGS: There is little change to perhaps slight increase in volume of the right pneumothorax with right basilar chest tube remaining. There is a small right pleural effusion indicating right hydro pneumothorax. Atelectasis at the right lung base is stable. The left lung is clear. Heart size is unchanged. IMPRESSION: 1. Slight increase in volume of the right hydro pneumothorax with right chest tube remaining at the right base. 2. Increase in right basilar atelectasis. Electronically Signed   By: Ivar Drape M.D.   On: 08/17/2015 08:14    Assessment/Plan: S/P Procedure(s) (LRB): VIDEO BRONCHOSCOPY WITH ATTEMPTED INSERTION OF INTERBRONCHIAL VALVE (IBV) WITH FLUROSCOPY (N/A) INSERTION OF SECOND RIGHT CHEST TUBE WITH FLUROSCOPY (Right)  1 chest xray appears stable, air leak is stable- ok for discharge from our perspective 2 appt on chart   LOS: 21 days    [name],[name] 08/18/2015  Plan home with chest tube today I have seen and examined Gabriel [name] and agree with the above assessment  and plan.  [name] [facility] [phone] Office [phone] 08/18/2015 10:16 AM

## 2015-08-18 NOTE — Discharge Summary (Signed)
Physician Discharge Summary  REMI LOPATA RCV:[id number] DOB: May 16, 1941 DOA: 07/28/2015  PCP: No PCP Per Patient  Admit date: 07/28/2015 Discharge date: 08/18/2015  Time spent: Greater than 30 minutes  Recommendations for Outpatient Follow-up:  1. Dr. Cameron Ali on 08/23/15 at 4:30 PM. Patient will have repeat chest x-ray on same day at 3:45 PM. 2. Home health RN 3. PCP in 1 week. Requested case management to arrange.  Discharge Diagnoses:  Principal Problem:   Pneumothorax Active Problems:   Pneumothorax on right   Non-small cell carcinoma of right lung, stage 3 (HCC)   COPD (chronic obstructive pulmonary disease) (HCC)   Lung cancer (HCC)   HCAP (healthcare-associated pneumonia)   COPD exacerbation (HCC)   Chest tube in place   Pneumothorax, right   Discharge Condition: Improved & Stable  Diet recommendation: Heart healthy diet.  Filed Weights   07/28/15 0652 07/28/15 1039  Weight: 61.236 kg (135 lb) 60.1 kg (132 lb 7.9 oz)    History of present illness:  74 year old male with history of stage III NSCLC (T2a, N2, M0), undergoing chemoradiation at the cancer center-carboplatin and paclitaxel with partial response, last seen by Dr. Julien Nordmann on 07/25/15 and was supposed to start treatment with carboplatin and gemcitabine on 08/08/15, prior history of right sided pneumothorax 04/04/15 requiring chest tube placement, admitted to [facility] on 07/28/15 with severe dyspnea. Chest x-ray in ED confirmed right-sided pneumothorax. Thoracic surgery consulted and chest tube was placed in the ED. They performed video bronchoscopy with attempted insertion of interbronchial valve with fluoroscopy and insertion of second right chest tube on 08/17/15. Since then his pneumothorax has not completely resolved and thoracic surgeons continue to manage. He has completed treatment for presumed pneumonia and COPD exacerbation. Thoracic surgery evaluated today and have cleared him for discharge home with  the chest tube and have arranged for outpatient follow-up.  Hospital Course:   Recurrent pneumothorax - Management per cardiothoracic surgery - May be secondary to history of lung cancer, COPD and ongoing tobacco abuse - s/p video bronchoscopy with attempted insertion of interbronchial valve with fluoroscopy and insertion of second right chest tube with fluoroscopy on 08/17/15 - Thoracic surgery have seen and cleared patient for discharge home today.  Stage III non-small cell lung cancer - Follows with Dr. Curt Bears, oncology - Status post treatment with chemotherapy and radiation therapy. Carboplatin and paclitaxel with partial response - Outpatient follow-up with oncology. Has appointments on Epic.  COPD exacerbation - Completed antibiotics and steroid taper - Exacerbation resolved. Continue bronchodilator inhalers when necessary & Symbicort  Oral thrush - Resolved on magic mouthwash  Probable healthcare associated pneumonia - Completed treatment with levofloxacin    Consultants:  Cardiothoracic surgery: Dr. Servando Snare  Procedures:  VIDEO BRONCHOSCOPY WITH ATTEMPTED INSERTION OF INTERBRONCHIAL VALVE (IBV) WITH FLUROSCOPY (N/A) 08/17/15  INSERTION OF SECOND RIGHT CHEST TUBE WITH FLUROSCOPY (Right) 08/17/15  Antibiotics:  Levofloxacin 10/6 >10/13  Discharge Exam:  Complaints: Denies complaints. Denies dyspnea or chest pain even with activity.  Filed Vitals:   08/17/15 1500 08/17/15 1649 08/17/15 2209 08/18/15 0518  BP: 129/75  135/82 101/67  Pulse: 96  110 86  Temp: 97.8 F (36.6 C)  97.5 F (36.4 C) 97.8 F (36.6 C)  TempSrc: Oral  Oral Oral  Resp: '20  18 18  '$ Height:      Weight:      SpO2: 97% 98% 95% 95%    General exam: Pleasant elderly male seen ambulating comfortably in the room. Respiratory system: Clear  to auscultation. No increased work of breathing. Right-sided chest tube +. Cardiovascular system: S1 & S2 heard, RRR. No JVD, murmurs,  gallops, clicks or pedal edema.  Gastrointestinal system: Abdomen is nondistended, soft and nontender. Normal bowel sounds heard. Central nervous system: Alert and oriented. No focal neurological deficits. Extremities: Symmetric 5 x 5 power.  Discharge Instructions      Discharge Instructions    Call MD for:  difficulty breathing, headache or visual disturbances    Complete by:  As directed      Call MD for:  extreme fatigue    Complete by:  As directed      Call MD for:  persistant dizziness or light-headedness    Complete by:  As directed      Call MD for:  persistant nausea and vomiting    Complete by:  As directed      Call MD for:  redness, tenderness, or signs of infection (pain, swelling, redness, odor or green/yellow discharge around incision site)    Complete by:  As directed      Call MD for:  severe uncontrolled pain    Complete by:  As directed      Call MD for:  temperature >100.4    Complete by:  As directed      Diet - low sodium heart healthy    Complete by:  As directed      Increase activity slowly    Complete by:  As directed             Medication List    STOP taking these medications        HYDROcodone-acetaminophen 5-325 MG tablet  Commonly known as:  NORCO/VICODIN     potassium chloride 10 MEQ tablet  Commonly known as:  K-DUR     prochlorperazine 10 MG tablet  Commonly known as:  COMPAZINE      TAKE these medications        acetaminophen 325 MG tablet  Commonly known as:  TYLENOL  Take 2 tablets (650 mg total) by mouth every 6 (six) hours as needed for mild pain, moderate pain, fever or headache (or Fever >/= 101).     albuterol 108 (90 BASE) MCG/ACT inhaler  Commonly known as:  PROVENTIL HFA;VENTOLIN HFA  Inhale 1-2 puffs into the lungs every 6 (six) hours as needed for wheezing or shortness of breath.     ALPRAZolam 0.5 MG tablet  Commonly known as:  XANAX  Take 1 tablet (0.5 mg total) by mouth daily as needed for anxiety.     b  complex vitamins tablet  Take 1 tablet by mouth daily.     budesonide-formoterol 160-4.5 MCG/ACT inhaler  Commonly known as:  SYMBICORT  Inhale 2 puffs into the lungs 2 (two) times daily.     CALCIUM CITRATE PO  Take 1 tablet by mouth daily.     docusate sodium 100 MG capsule  Commonly known as:  COLACE  Take 100 mg by mouth 3 (three) times daily as needed for mild constipation.     Fish Oil 1000 MG Caps  Take 1,000 mg by mouth daily.     L-Arginine 1000 MG Tabs  Take 1,000 mg by mouth daily.     magnesium 30 MG tablet  Take 30 mg by mouth daily.     MULTI VITAMIN DAILY PO  Take 1 tablet by mouth daily.     oxyCODONE 5 MG immediate release tablet  Commonly known as:  Oxy IR/ROXICODONE  Take 1 tablet (5 mg total) by mouth every 8 (eight) hours as needed for severe pain.     vitamin A 10000 UNIT capsule  Take 10,000 Units by mouth daily.     VITAMIN B 12 PO  Take 1 tablet by mouth daily.     vitamin C 1000 MG tablet  Take 1,000 mg by mouth daily.       Follow-up Information    Follow up with Grace Isaac, MD On 08/23/2015.   Specialty:  Cardiothoracic Surgery   Why:  PA/lAT CXR to be taken (at [facility] which is in the same building as Dr. Everrett Coombe office) on 08/23/2015  at 3:45 pm;Appointment time is at 4:30 pm   Contact information:   [place] [address] [phone]       Follow up with [facility].   Why:  HH-RN arranged   Contact information:   [address] [place] [address] [phone]       Follow up with Family Doctor. Schedule an appointment as soon as possible for a visit in 1 week.       The results of significant diagnostics from this hospitalization (including imaging, microbiology, ancillary and laboratory) are listed below for reference.    Significant Diagnostic Studies: Dg Chest 2 View  08/18/2015  CLINICAL DATA:  Chest tube in place EXAM: CHEST  2 VIEW  COMPARISON:  Yesterday FINDINGS: Unchanged right hydro pneumothorax with predominant air component which is approximately 25%. Right basilar atelectasis is unchanged. Normal heart size and aortic contours. Stable left lung aeration. IMPRESSION: Unchanged right hydropneumothorax with stable chest tube positioning. Electronically Signed   By: Monte Fantasia M.D.   On: 08/18/2015 08:16   Dg Chest 2 View  08/17/2015  CLINICAL DATA:  Right chest tube, history of spontaneous pneumothorax EXAM: CHEST  2 VIEW COMPARISON:  Portable chest x-ray of 08/16/2015 FINDINGS: There is little change to perhaps slight increase in volume of the right pneumothorax with right basilar chest tube remaining. There is a small right pleural effusion indicating right hydro pneumothorax. Atelectasis at the right lung base is stable. The left lung is clear. Heart size is unchanged. IMPRESSION: 1. Slight increase in volume of the right hydro pneumothorax with right chest tube remaining at the right base. 2. Increase in right basilar atelectasis. Electronically Signed   By: Ivar Drape M.D.   On: 08/17/2015 08:14   Dg Chest 2 View  08/13/2015  CLINICAL DATA:  Right chest tube EXAM: CHEST  2 VIEW COMPARISON:  08/12/2015 FINDINGS: Moderate right apical/basilar pneumothorax, mildly decreased. Indwelling medial right chest tube. Left lung is clear, noting emphysematous changes. Heart is normal in size. Degenerative changes of the visualized thoracolumbar spine. IMPRESSION: Moderate right pneumothorax, mildly decreased. Indwelling medial right chest tube. Electronically Signed   By: Julian Hy M.D.   On: 08/13/2015 09:19   Dg Chest 2 View  08/12/2015  CLINICAL DATA:  Spontaneous pneumothorax. Chest tube. Shortness of breath today. Congestion this evening. No fever. Smoker. EXAM: CHEST  2 VIEW COMPARISON:  08/12/2015 FINDINGS: Right chest tube is unchanged in position. Persistent right pneumothorax is increasing since previous study  with increased atelectasis in the right lung. Left lung is clear. Heart size and pulmonary vascularity are normal. Calcification of the aorta. IMPRESSION: Increasing right pneumothorax with increased atelectasis in the right lung despite chest tube. Electronically Signed   By: Oren Beckmann.D.  On: 08/12/2015 22:32   Dg Chest 2 View  08/10/2015  CLINICAL DATA:  Patient with right-sided chest pain and shortness of breath. EXAM: CHEST  2 VIEW COMPARISON:  Chest radiograph 08/09/2015 FINDINGS: Two right-sided chest tubes remain in place. Slight interval increase in size of moderate right pneumothorax, particularly the basilar component. Stable cardiac and mediastinal contours. Unchanged heterogeneous opacities right lung base. Left lung is clear. Mid thoracic spine degenerative changes. IMPRESSION: Slight interval increase in size of right pneumothorax with interval increase in size of the basilar component. Two right chest tubes remain in place. Persistent heterogeneous opacities right lung base. These results will be called to the ordering clinician or representative by the Radiologist Assistant, and communication documented in the PACS or zVision Dashboard. Electronically Signed   By: Lovey Newcomer M.D.   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 EXAM: CT CHEST WITH CONTRAST TECHNIQUE: Multidetector CT imaging of the chest was performed during intravenous contrast administration. CONTRAST:  54m OMNIPAQUE IOHEXOL 300 MG/ML  SOLN COMPARISON:  04/12/2015 PET-CT and 04/05/2015 chest CT. FINDINGS: Mediastinum/Nodes: Normal heart size. No pericardial fluid/thickening. There is atherosclerosis of the thoracic aorta, the great vessels of the mediastinum and the coronary arteries,  including calcified atherosclerotic plaque in the left anterior descending coronary artery. Great vessels are normal in course and caliber. No central pulmonary emboli. Stable 0.7 cm hypodense right thyroid lobe nodule. Normal esophagus. No axillary lymphadenopathy. No pathologically enlarged mediastinal or left hilar lymph nodes. Lungs/Pleura: No pneumothorax. No pleural effusion. There is a 3.8 x 2.7 cm spiculated lung mass centered in the central right lower/middle lobe (series 2/ image 35), minimally decreased from 4.0 x 3.0 cm on 04/12/2015. The mass encases the proximal right middle and right lower lobe bronchi, with improved patency/decreased narrowing of these bronchi in the interval. The mass is confluent with right infrahilar lymphadenopathy, unchanged. There is subpleural 1.0 x 1.0 cm lateral apical left upper lobe irregular pulmonary nodule (series 5/image 7), previously 1.2 x 1.1 cm, minimally decreased. A 1.0 x 0.7 cm medial subpleural irregular left upper lobe pulmonary nodule (5/11) is slightly decreased from 1.2 x 0.9 cm. A sub solid 1.5 x 1.1 cm posterior left upper lobe pulmonary nodule (5/15) is decreased from 1.8 x 1.4 cm with decreased solid component now measuring 0.8 cm, previously 1.0 cm. A cavitary sub solid superior segment left lower lobe 2.2 x 2.0 cm pulmonary nodule with 0.6 cm solid component (5/28) is decreased from 2.3 x 2.1 cm with solid component previously 0.9 cm. A sub solid and possibly cavitary 1.3 x 1.0 cm basilar left lower lobe pulmonary nodule (5/50) is not appreciably changed, previously 1.4 x 1.0 cm. There is a new sub solid 1.0 x 0.9 cm peripheral right upper lobe pulmonary nodule (5/29). Focal peribronchovascular nodularity and interstitial thickening (5/35) in the peripheral right middle lobe is not appreciably changed and suspicious for focal lymphangitic tumor spread. Stable moderate centrilobular and paraseptal emphysema. Subsegmental scarring versus atelectasis in  the basilar right lower lobe. Upper abdomen: Stable 1.9 cm hypodense right adrenal nodule, characterized as a benign adenoma on 04/05/2015 chest CT. Musculoskeletal: Moderate degenerative changes in the thoracic spine. No aggressive appearing focal osseous lesions. IMPRESSION: 1. Slight interval decreased size of the primary spiculated central right lower/middle lobe  malignancy, now 3.8 x 2.7 cm. Improved patency of the encased right middle and right lower lobe bronchi. 2. Stable focal peribronchovascular nodularity and interstitial thickening in the peripheral right middle lobe, suspicious for stable focal lymphangitic tumor spread. 3. Nonspecific new subsolid 1.0 cm peripheral right upper lobe pulmonary nodule, which could represent early post treatment change or a subsolid metastasis. 4. Stable to decreased size of the previously described sub solid left lung nodules, several of which are cavitary. These remain suspicious for lung metastases. 5. Moderate centrilobular and paraseptal emphysema. 6. Atherosclerosis, including left anterior descending coronary artery disease. Electronically Signed   By: Ilona Sorrel M.D.   On: 07/22/2015 17:34   Dg Chest Port 1 View  08/16/2015  CLINICAL DATA:  Chest chest tube.  Pneumothorax. EXAM: PORTABLE CHEST 1 VIEW COMPARISON:  08/15/2015 . FINDINGS: Right chest tube in stable position. Persistent right-sided small pneumothorax, no change. Mediastinum and hilar structures are normal. Mild cardiomegaly. No pleural effusion. No acute bony abnormality . IMPRESSION: 1. Right chest tube in stable position. Persistent unchanged small right pneumothorax. 2.  Right base atelectasis and/or infiltrate. Electronically Signed   By: Marcello Moores  Register   On: 08/16/2015 08:22   Dg Chest Port 1 View  08/15/2015  CLINICAL DATA:  Pneumothorax. EXAM: PORTABLE CHEST 1 VIEW COMPARISON:  08/14/2015 . FINDINGS: Right chest tube in stable position. Slight increase in right-sided pneumothorax.  Persistent right base subsegmental atelectasis. Cardiomegaly with normal pulmonary vascularity. IMPRESSION: 1. Right chest tube in stable position. Slight increase in right-sided pneumothorax. 2. Right base subsegmental atelectasis. 3. Cardiomegaly, no pulmonary venous congestion Electronically Signed   By: [place]   On: 08/15/2015 07:48   Dg Chest Port 1 View  08/14/2015  CLINICAL DATA:  Right sided pneumothorax Hx of cancer, spontaneous pneumothorax, lung mass, inguinal hernia EXAM: PORTABLE CHEST 1 VIEW COMPARISON:  One day prior FINDINGS: Midline trachea. Mild cardiomegaly. Mild right hemidiaphragm elevation. Remote left rib trauma. No pleural fluid. Right-sided chest tube remains in place. Moderate right-sided pneumothorax. Visceral pleural line measures on the order of 2.4 cm from the chest wall superiorly and laterally. Decreased from 3.6 cm at the same location on the prior. Inferolateral component is also less well-defined today. Secondary right infrahilar atelectasis. IMPRESSION: Decreased in moderate right-sided pneumothorax, right-sided chest tube remaining in place. No new findings. Electronically Signed   By: Abigail Miyamoto M.D.   On: 08/14/2015 08:55   Dg Chest Port 1 View  08/12/2015  CLINICAL DATA:  Chest tube in place EXAM: PORTABLE CHEST - 1 VIEW COMPARISON:  08/11/2015 FINDINGS: 1 of the 2 right chest tubes has been removed. A chest tube remains directed towards the infrahilar region. Moderate right pneumothorax has slightly improved, with persistent apical and basilar components. Apex of the lung projects at the posterior aspect of the third rib. Partial resolution of the atelectasis/consolidation at the right lung base. Left lung remains clear.  Heart size normal. No effusion. Old Left sixth and seventh rib fractures. IMPRESSION: 1. Removal of 1 of 2 right chest tubes, with slight decrease in residual pneumothorax and right lower lung atelectasis/consolidation. Electronically  Signed   By: Lucrezia Europe M.D.   On: 08/12/2015 08:09   Dg Chest Port 1 View  08/11/2015  CLINICAL DATA:  Pneumothorax with chest tubes.  Right lung cancer. EXAM: PORTABLE CHEST 1 VIEW COMPARISON:  Chest radiograph from one day prior. FINDINGS: Two right-sided chest tubes are stable. Stable cardiomediastinal silhouette with top-normal heart size. Small moderate  right pneumothorax with apical and basilar components is not appreciably changed. No left pneumothorax. No mediastinal shift. No pleural effusion. Patchy opacity in the right lower lung is unchanged. No new focal lung opacity. Emphysematous change is seen in the upper lungs. IMPRESSION: 1. Stable small to moderate right pneumothorax with apical and basilar components. 2. Stable nonspecific patchy right basilar lung opacity, likely a combination of atelectasis and tumor. Electronically Signed   By: Ilona Sorrel M.D.   On: 08/11/2015 08:07   Dg Chest Port 1 View  08/09/2015  CLINICAL DATA:  Followup right pneumothorax with chest tubes in place. EXAM: PORTABLE CHEST 1 VIEW COMPARISON:  08/08/2015 and earlier. FINDINGS: 2 right chest tubes in place with a residual right apical, medial and basilar pneumothorax on the order of 15-20% or so, unchanged. 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 Atelectasis at the right lung base, increased since yesterday. Left lung remains clear. Cardiac silhouette enlarged, unchanged. Pulmonary vascularity normal. IMPRESSION: 1. No change in the approximate 15-20% or so right pneumothorax with chest tubes in place. 2. Increasing right basilar atelectasis. Electronically Signed   By: Evangeline Dakin M.D.   On: 08/09/2015 08:14   Dg Chest Port 1 View  08/08/2015  CLINICAL DATA:  Right-sided pneumothorax with chest tube treatment, lung malignancy, [facility] OPD EXAM: PORTABLE CHEST 1 VIEW COMPARISON:  Portable chest x-ray of August 06, 2015 FINDINGS: A persistent right apical and  subpulmonic pneumothorax is demonstrated. There are 2 chest tubes in place today. The medial chest tube is stable. The lower chest tube is new and and upper chest tube has been removed. There is density within the lung parenchyma are just above the right hemidiaphragm which is stable. The left lung is clear. There is no significant mediastinal shift. The cardiac silhouette remains enlarged. The pulmonary vascularity is normal. The bony thorax exhibits no acute abnormality. IMPRESSION: There is a persistent approximately 15-20% right-sided pneumothorax in the apex and subpulmonic region. Two right chest tubes are present with the tip of the medial chest tube being most superiorly positioned just inferior to the eighth rib. Stable parenchymal density at the right lung base. Electronically Signed   By: David  [place]ique M.D.   On: 08/08/2015 07:27   Dg Chest Port 1 View  08/06/2015  CLINICAL DATA:  Chest tube.  Shortness of breath. EXAM: PORTABLE CHEST 1 VIEW COMPARISON:  Chest radiograph from one day prior. FINDINGS: Stable cardiomediastinal silhouette with normal heart size. Stable position of 2 right chest tubes. Small right apical pneumothorax, slightly decreased. No left pneumothorax. No pleural effusion. No pulmonary edema. Patchy masslike consolidation at the right lung base, decreased. IMPRESSION: 1. Small right apical pneumothorax, slightly decreased. 2. Patchy masslike right basilar lung consolidation, decreased, in keeping with decreased atelectasis and persistent known right lung base mass. Electronically Signed   By: Ilona Sorrel M.D.   On: 08/06/2015 07:33   Dg Chest Port 1 View  08/05/2015  CLINICAL DATA:  Chest tubes, recent bronchoscopy EXAM: PORTABLE CHEST 1 VIEW COMPARISON:  Portable exam 0811 hours compared to 08/04/2015 FINDINGS: Pair of RIGHT thoracostomy tubes again identified. Persistent mild to moderate RIGHT apex pneumothorax little changed. Persistent consolidation of RIGHT middle and RIGHT  lower lobes question atelectasis though infiltrate not excluded. Blebs noted at RIGHT apex. Heart size stable. LEFT lung clear. Bones unremarkable. IMPRESSION: Persistent mild to moderate RIGHT pneumothorax despite thoracostomy tubes. Persistent atelectasis versus consolidation of RIGHT middle and RIGHT lower lobes. Electronically  Signed   By: Lavonia Dana M.D.   On: 08/05/2015 08:20   Dg Chest Port 1 View  08/04/2015  CLINICAL DATA:  Shortness of breath. EXAM: PORTABLE CHEST 1 VIEW COMPARISON:  Same day. FINDINGS: Two right-sided chest tubes are again noted and unchanged in position. Right-sided pneumothorax noted on prior exam is significantly smaller, with mild apical component remaining. Stable right basilar opacity is noted consistent with atelectasis or infiltrate and possible associated pleural effusion. Left lung is clear and hyperexpanded. Mediastinal shift to the right is noted. Old left rib fractures are noted. IMPRESSION: Mild right apical pneumothorax is noted which is significantly smaller since prior exam. 2 right-sided chest tubes are unchanged in position. Persistent right basilar opacity is noted consistent with atelectasis or infiltrate and possible associated effusion. Electronically Signed   By: Marijo Conception, M.D.   On: 08/04/2015 11:45   Dg Chest Port 1 View  08/04/2015  CLINICAL DATA:  Pneumothorax. EXAM: PORTABLE CHEST 1 VIEW COMPARISON:  08/03/2015.  CT 07/22/2015 . FINDINGS: Two right chest tubes in stable position. Progression of right pneumothorax. Atelectasis and consolidation of the right lower lobe. Persistent right infrahilar mass. Left lung is clear. Heart size normal. IMPRESSION: 1. Two right chest tubes in stable position. Significant progression of right-sided pneumothorax. Pneumothorax is approximately 40%. 2. Dense atelectasis and consolidation of the right lower lobe. Persistent right infrahilar mass. Critical Value/emergent results were called by telephone at the time  of interpretation on 08/04/2015 at 7:46 am to nurse Lee,who verbally acknowledged these results. Electronically Signed   By: Marcello Moores  Register   On: 08/04/2015 07:47   Dg Chest Port 1 View  08/03/2015  CLINICAL DATA:  74 year old male, chest tube placement. Stage IIIA right lung adenocarcinoma. Initial encounter. EXAM: PORTABLE CHEST 1 VIEW COMPARISON:  08/02/2015 and earlier. FINDINGS: Portable AP upright view at 0712 hours. Two right side chest tubes are stable. Patchy opacity at the right lung base along the course of the tubes is stable. Stable right pneumothorax, small to moderate. Pleural edge is also visible at the right lung base today. Slightly lower lung volumes. Stable cardiac size and mediastinal contours. Stable left lung. IMPRESSION: Mildly lower lung volumes today. Small to moderate right pneumothorax is stable with 2 right chest tubes remaining in place. Electronically Signed   By: [name]   On: 08/03/2015 08:21   Dg Chest Port 1 View  08/02/2015  CLINICAL DATA:  Status post intrabronchial valve placement EXAM: CHEST RADIOGRAPH-ONE VIEW COMPARISON:  August 01, 2015 FINDINGS: There are 2 chest tubes on the right. Pneumothorax on the right is slightly larger compared to 1 day prior. Is patchy consolidation in the right base. There is generalized interstitial prominence, likely reflecting chronic inflammatory type change. Heart size and pulmonary vascularity are normal. No adenopathy. IMPRESSION: Pneumothorax on the right is slightly larger compared to 1 day prior. Two chest tubes are currently present on the right there is no tension component. There is persistent patchy consolidation right base. Left lung clear except for underlying interstitial prominence which is stable and probably reflective of chronic inflammatory type change. No change in cardiac silhouette. Electronically Signed   By: [name]   On: 08/02/2015 10:15   Dg Chest Port 1 View  08/01/2015  CLINICAL  DATA:  Pneumothorax. EXAM: PORTABLE CHEST 1 VIEW COMPARISON:  07/31/2015.  CT 07/22/2015 FINDINGS: Right chest tube in stable position. Stable tiny right apical pneumothorax. Heart size stable. Right hilar mass  unchanged. Persistent right base subsegmental atelectasis. Represent made to recent CT report for discussion of pulmonary nodules. No prominent pleural effusion. Old left posterior rib fractures. IMPRESSION: 1. Right chest tube in stable position. Stable right apical pneumothorax. 2. Right hilar mass unchanged.  Persistent bibasilar atelectasis. Electronically Signed   By: [place]   On: 08/01/2015 07:32   Dg Chest Port 1 View  07/31/2015  CLINICAL DATA:  Pneumothorax.  Lung cancer EXAM: PORTABLE CHEST 1 VIEW COMPARISON:  07/30/2015 FINDINGS: Right chest tube in place. Increased right pneumothorax now approximately 10%. Slight increase in right lower lobe atelectasis. Right hilar lung mass unchanged. Negative for heart failure. COPD. IMPRESSION: Interval increase in right apical pneumothorax now approximately 10%. Increased right lower lobe atelectasis. Electronically Signed   By: Franchot Gallo M.D.   On: 07/31/2015 07:16   Dg Chest Port 1 View  07/30/2015  CLINICAL DATA:  Chest tube, RIGHT lung cancer EXAM: PORTABLE CHEST 1 VIEW COMPARISON:  Radiograph 07/29/2015 FINDINGS: Normal cardiac silhouette. Chest tube in the RIGHT lower lobe with a focus of atelectasis or contusion. No interval change. Small RIGHT apical pneumothorax is unchanged in volume. No pulmonary edema. IMPRESSION: 1. Small RIGHT apical pneumothorax with chest tube in place. 2. No interval change. Electronically Signed   By: Suzy Bouchard M.D.   On: 07/30/2015 07:45   Dg Chest Port 1 View  07/29/2015  CLINICAL DATA:  Pneumothorax and right chest tube EXAM: PORTABLE CHEST 1 VIEW COMPARISON:  Yesterday FINDINGS: Right basilar chest tube has a more horizontal appearance but still overlaps the lower chest to the same degree.  Small right apical and basilar pneumothorax is unchanged, less than 10%. Increase in right basilar atelectasis. Background of emphysema. No edema, pneumonia, or effusion. Remote left rib fractures. Normal heart size. IMPRESSION: 1. Stable small, less than 10%, right pneumothorax. 2. Shifted chest tube but still overlapping the lower chest. Electronically Signed   By: Monte Fantasia M.D.   On: 07/29/2015 06:58   Dg Chest Portable 1 View  07/28/2015  CLINICAL DATA:  74 year old male with right-sided chest tube for pneumothorax EXAM: PORTABLE CHEST 1 VIEW COMPARISON:  Prior chest x-ray 07/28/2015 FINDINGS: Right-sided thoracostomy tube has been placed. There has been partial interval re-expansion of the lung. A small (less than 10%) residual pneumothorax remains. Improving atelectasis in the right middle and lower lobes. Background chronic parenchymal lung changes again noted. Cardiac and mediastinal contours are unchanged right infrahilar mass again noted. No left-sided pneumothorax. No acute osseous abnormality. IMPRESSION: 1. Significantly improved right-sided pneumothorax status post placement of thoracostomy tube. There is a small (less than 10%) residual pneumothorax. 2. Improving right lower and middle lobe atelectasis. Electronically Signed   By: Jacqulynn Cadet M.D.   On: 07/28/2015 09:51   Dg Chest Portable 1 View  07/28/2015  CLINICAL DATA:  Short of breath. EXAM: PORTABLE CHEST 1 VIEW COMPARISON:  05/18/2015 FINDINGS: Large right pneumothorax approximately 60%. There is collapse in the right lower lobe. No significant effusion. Increased interstitial markings in the left lung likely due to mild interstitial edema and vascular congestion. No edema or effusion on the left. Underlying chronic lung disease and COPD. IMPRESSION: 60% right pneumothorax. Critical Value/emergent results were called by telephone at the time of interpretation on 07/28/2015 at 7:51 am to Dr. Gareth Morgan , who verbally  acknowledged these results. Electronically Signed   By: Franchot Gallo M.D.   On: 07/28/2015 07:52   Dg Fluoro Guide Cv Line-no Report  08/02/2015  CLINICAL DATA:  FLOURO GUIDE CV LINE Fluoroscopy was utilized by the requesting physician.  No radiographic interpretation.    Microbiology: No results found for this or any previous visit (from the past 240 hour(s)).   Labs: Basic Metabolic Panel:  Recent Labs Lab 08/13/15 0416 08/17/15 0322  NA 137 138  K 3.9 4.0  CL 101 101  CO2 28 28  GLUCOSE 78 92  BUN 21* 14  CREATININE 0.95 0.91  CALCIUM 9.0 9.1   Liver Function Tests: No results for input(s): AST, ALT, ALKPHOS, BILITOT, PROT, ALBUMIN in the last 168 hours. No results for input(s): LIPASE, AMYLASE in the last 168 hours. No results for input(s): AMMONIA in the last 168 hours. CBC:  Recent Labs Lab 08/13/15 0416 08/17/15 0322  WBC 9.3 8.5  HGB 12.2* 12.1*  HCT 36.4* 37.1*  MCV 92.9 93.0  PLT 219 224   Cardiac Enzymes: No results for input(s): CKTOTAL, CKMB, CKMBINDEX, TROPONINI in the last 168 hours. BNP: BNP (last 3 results)  Recent Labs  04/04/15 1831 07/28/15 0720  BNP 53.8 42.7    ProBNP (last 3 results) No results for input(s): PROBNP in the last 8760 hours.  CBG: No results for input(s): GLUCAP in the last 168 hours.   Additional labs: 1. HIV screen: Nonreactive   Signed:  Vernell Leep, MD, FACP, FHM. [facility] Pager [phone]  If 7PM-7AM, please contact night-coverage [email or web address] Password [email or web address] 08/18/2015, 11:45 AM

## 2015-08-18 NOTE — Progress Notes (Signed)
08/18/2015 1430 Discharge AVS meds taken today and those due this evening reviewed.  Follow-up appointments and when to call md reviewed.  D/C IV and TELE.  Questions and concerns addressed.   D/C home per orders. [place]

## 2015-08-18 NOTE — Progress Notes (Signed)
08/18/2015 12:53 PM Changed pt's chest tube dressing per his request.  Demonstrated how to change dressing if should need to.  Discussed in depth care and maintenance of the mini express.  Pt demonstrated how to empty drainage system.  Instructed pt on what to do if chest tube should come out or if tubing pulls apart from drainage box.  Pt demonstrated what to do.  Pt given a few supplies for care of the mini express until he can receive more from [facility] nurse.   [place]

## 2015-08-19 DIAGNOSIS — J9381 Chronic pneumothorax: Secondary | ICD-10-CM | POA: Diagnosis not present

## 2015-08-19 DIAGNOSIS — C3491 Malignant neoplasm of unspecified part of right bronchus or lung: Secondary | ICD-10-CM | POA: Diagnosis not present

## 2015-08-22 ENCOUNTER — Other Ambulatory Visit: Payer: Self-pay | Admitting: Cardiothoracic Surgery

## 2015-08-22 ENCOUNTER — Other Ambulatory Visit: Payer: 59

## 2015-08-22 DIAGNOSIS — C3490 Malignant neoplasm of unspecified part of unspecified bronchus or lung: Secondary | ICD-10-CM

## 2015-08-23 ENCOUNTER — Ambulatory Visit: Payer: 59 | Admitting: Cardiothoracic Surgery

## 2015-08-23 ENCOUNTER — Encounter: Payer: Self-pay | Admitting: Cardiothoracic Surgery

## 2015-08-23 ENCOUNTER — Inpatient Hospital Stay (HOSPITAL_COMMUNITY)
Admission: AD | Admit: 2015-08-23 | Discharge: 2015-09-14 | DRG: 199 | Disposition: A | Discharge status: Home Health | Payer: 59 | Source: Ambulatory Visit | Attending: Cardiothoracic Surgery | Admitting: Cardiothoracic Surgery

## 2015-08-23 ENCOUNTER — Ambulatory Visit (INDEPENDENT_AMBULATORY_CARE_PROVIDER_SITE_OTHER): Payer: 59 | Admitting: Cardiothoracic Surgery

## 2015-08-23 ENCOUNTER — Ambulatory Visit
Admission: RE | Admit: 2015-08-23 | Discharge: 2015-08-23 | Disposition: A | Discharge status: Home / Self Care | Payer: 59 | Source: Ambulatory Visit | Attending: Cardiothoracic Surgery | Admitting: Cardiothoracic Surgery

## 2015-08-23 VITALS — BP 113/76 | HR 112 | Ht 70.0 in | Wt 133.0 lb

## 2015-08-23 DIAGNOSIS — J9382 Other air leak: Secondary | ICD-10-CM

## 2015-08-23 DIAGNOSIS — D649 Anemia, unspecified: Secondary | ICD-10-CM | POA: Diagnosis present

## 2015-08-23 DIAGNOSIS — E43 Unspecified severe protein-calorie malnutrition: Secondary | ICD-10-CM | POA: Diagnosis present

## 2015-08-23 DIAGNOSIS — K5900 Constipation, unspecified: Secondary | ICD-10-CM | POA: Diagnosis not present

## 2015-08-23 DIAGNOSIS — J939 Pneumothorax, unspecified: Secondary | ICD-10-CM

## 2015-08-23 DIAGNOSIS — R197 Diarrhea, unspecified: Secondary | ICD-10-CM | POA: Diagnosis not present

## 2015-08-23 DIAGNOSIS — E871 Hypo-osmolality and hyponatremia: Secondary | ICD-10-CM | POA: Diagnosis not present

## 2015-08-23 DIAGNOSIS — Z789 Other specified health status: Secondary | ICD-10-CM | POA: Diagnosis not present

## 2015-08-23 DIAGNOSIS — J9383 Other pneumothorax: Secondary | ICD-10-CM | POA: Diagnosis present

## 2015-08-23 DIAGNOSIS — F17210 Nicotine dependence, cigarettes, uncomplicated: Secondary | ICD-10-CM | POA: Diagnosis present

## 2015-08-23 DIAGNOSIS — R5383 Other fatigue: Secondary | ICD-10-CM | POA: Insufficient documentation

## 2015-08-23 DIAGNOSIS — J449 Chronic obstructive pulmonary disease, unspecified: Secondary | ICD-10-CM | POA: Diagnosis present

## 2015-08-23 DIAGNOSIS — J9621 Acute and chronic respiratory failure with hypoxia: Secondary | ICD-10-CM | POA: Diagnosis not present

## 2015-08-23 DIAGNOSIS — F419 Anxiety disorder, unspecified: Secondary | ICD-10-CM | POA: Diagnosis present

## 2015-08-23 DIAGNOSIS — L8990 Pressure ulcer of unspecified site, unspecified stage: Secondary | ICD-10-CM | POA: Insufficient documentation

## 2015-08-23 DIAGNOSIS — Z938 Other artificial opening status: Secondary | ICD-10-CM | POA: Diagnosis not present

## 2015-08-23 DIAGNOSIS — C3491 Malignant neoplasm of unspecified part of right bronchus or lung: Secondary | ICD-10-CM | POA: Diagnosis present

## 2015-08-23 DIAGNOSIS — R531 Weakness: Secondary | ICD-10-CM | POA: Diagnosis not present

## 2015-08-23 DIAGNOSIS — IMO0002 Reserved for concepts with insufficient information to code with codable children: Secondary | ICD-10-CM

## 2015-08-23 DIAGNOSIS — J189 Pneumonia, unspecified organism: Secondary | ICD-10-CM | POA: Diagnosis not present

## 2015-08-23 DIAGNOSIS — J9811 Atelectasis: Secondary | ICD-10-CM | POA: Diagnosis present

## 2015-08-23 DIAGNOSIS — J9311 Primary spontaneous pneumothorax: Secondary | ICD-10-CM | POA: Diagnosis not present

## 2015-08-23 DIAGNOSIS — R079 Chest pain, unspecified: Secondary | ICD-10-CM | POA: Diagnosis not present

## 2015-08-23 DIAGNOSIS — Z9689 Presence of other specified functional implants: Secondary | ICD-10-CM

## 2015-08-23 DIAGNOSIS — J9690 Respiratory failure, unspecified, unspecified whether with hypoxia or hypercapnia: Secondary | ICD-10-CM

## 2015-08-23 DIAGNOSIS — R0602 Shortness of breath: Secondary | ICD-10-CM | POA: Diagnosis present

## 2015-08-23 DIAGNOSIS — Y95 Nosocomial condition: Secondary | ICD-10-CM | POA: Diagnosis not present

## 2015-08-23 DIAGNOSIS — Z515 Encounter for palliative care: Secondary | ICD-10-CM | POA: Diagnosis present

## 2015-08-23 DIAGNOSIS — J441 Chronic obstructive pulmonary disease with (acute) exacerbation: Secondary | ICD-10-CM | POA: Diagnosis present

## 2015-08-23 DIAGNOSIS — C3490 Malignant neoplasm of unspecified part of unspecified bronchus or lung: Secondary | ICD-10-CM

## 2015-08-23 HISTORY — DX: Pneumothorax, unspecified: J93.9

## 2015-08-23 LAB — COMPREHENSIVE METABOLIC PANEL
ALT: 19 U/L (ref 17–63)
AST: 20 U/L (ref 15–41)
Albumin: 3.4 g/dL — ABNORMAL LOW (ref 3.5–5.0)
Alkaline Phosphatase: 80 U/L (ref 38–126)
Anion gap: 8 (ref 5–15)
BUN: 20 mg/dL (ref 6–20)
CO2: 25 mmol/L (ref 22–32)
Calcium: 9.7 mg/dL (ref 8.9–10.3)
Chloride: 103 mmol/L (ref 101–111)
Creatinine, Ser: 0.98 mg/dL (ref 0.61–1.24)
GFR calc Af Amer: >60 mL/min (ref >60)
GFR calc non Af Amer: >60 mL/min (ref >60)
Glucose, Bld: 125 mg/dL — ABNORMAL HIGH (ref 65–99)
Potassium: 4.5 mmol/L (ref 3.5–5.1)
Sodium: 136 mmol/L (ref 135–145)
Total Bilirubin: 1.1 mg/dL (ref 0.3–1.2)
Total Protein: 6.8 g/dL (ref 6.5–8.1)

## 2015-08-23 LAB — CBC
HCT: 42.9 % (ref 39.0–52.0)
Hemoglobin: 14.8 g/dL (ref 13.0–17.0)
MCH: 31.8 pg (ref 26.0–34.0)
MCHC: 34.5 g/dL (ref 30.0–36.0)
MCV: 92.3 fL (ref 78.0–100.0)
Platelets: 219 10*3/uL (ref 150–400)
RBC: 4.65 MIL/uL (ref 4.22–5.81)
RDW: 15 % (ref 11.5–15.5)
WBC: 16.3 10*3/uL — ABNORMAL HIGH (ref 4.0–10.5)

## 2015-08-23 MED ORDER — SENNA 8.6 MG PO TABS
1.0000 | ORAL_TABLET | Freq: Two times a day (BID) | ORAL | Status: DC
  Administered 2015-08-23 – 2015-09-06 (×24): 8.6 mg via ORAL
  Filled 2015-08-23 (×36): qty 1

## 2015-08-23 MED ORDER — PSEUDOEPHEDRINE HCL ER 120 MG PO TB12
120.0000 mg | ORAL_TABLET | Freq: Two times a day (BID) | ORAL | Status: DC
  Administered 2015-08-23: 120 mg via ORAL
  Filled 2015-08-23 (×5): qty 1

## 2015-08-23 MED ORDER — VITAMIN C 500 MG PO TABS
1000.0000 mg | ORAL_TABLET | Freq: Every day | ORAL | Status: DC
  Administered 2015-08-24: 1000 mg via ORAL
  Administered 2015-08-25: 500 mg via ORAL
  Administered 2015-08-26 – 2015-09-14 (×20): 1000 mg via ORAL
  Filled 2015-08-23 (×23): qty 2

## 2015-08-23 MED ORDER — ONDANSETRON HCL 4 MG PO TABS: 4.0000 mg | ORAL_TABLET | Freq: Four times a day (QID) | ORAL | Status: DC | PRN

## 2015-08-23 MED ORDER — SODIUM CHLORIDE 0.9 % IJ SOLN
3.0000 mL | Freq: Two times a day (BID) | INTRAMUSCULAR | Status: DC
  Administered 2015-08-23 – 2015-09-14 (×34): 3 mL via INTRAVENOUS

## 2015-08-23 MED ORDER — OXYCODONE HCL 5 MG PO TABS
5.0000 mg | ORAL_TABLET | Freq: Three times a day (TID) | ORAL | Status: DC | PRN
  Administered 2015-08-23 – 2015-08-24 (×2): 5 mg via ORAL
  Filled 2015-08-23 (×2): qty 1

## 2015-08-23 MED ORDER — ENOXAPARIN SODIUM 30 MG/0.3ML ~~LOC~~ SOLN
30.0000 mg | Freq: Every day | SUBCUTANEOUS | Status: DC
  Administered 2015-08-23 – 2015-09-13 (×22): 30 mg via SUBCUTANEOUS
  Filled 2015-08-23 (×22): qty 0.3

## 2015-08-23 MED ORDER — B COMPLEX-C PO TABS
1.0000 | ORAL_TABLET | Freq: Every day | ORAL | Status: DC
  Administered 2015-08-24 – 2015-09-14 (×22): 1 via ORAL
  Filled 2015-08-23 (×25): qty 1

## 2015-08-23 MED ORDER — GUAIFENESIN ER 600 MG PO TB12
1200.0000 mg | ORAL_TABLET | Freq: Two times a day (BID) | ORAL | Status: DC
  Administered 2015-08-23 – 2015-09-14 (×44): 1200 mg via ORAL
  Filled 2015-08-23 (×46): qty 2

## 2015-08-23 MED ORDER — ALBUTEROL SULFATE (2.5 MG/3ML) 0.083% IN NEBU: 2.5000 mg | INHALATION_SOLUTION | Freq: Four times a day (QID) | RESPIRATORY_TRACT | Status: DC | PRN

## 2015-08-23 MED ORDER — CETYLPYRIDINIUM CHLORIDE 0.05 % MT LIQD
7.0000 mL | Freq: Two times a day (BID) | OROMUCOSAL | Status: DC
  Administered 2015-08-23: 7 mL via OROMUCOSAL

## 2015-08-23 MED ORDER — ALBUTEROL SULFATE HFA 108 (90 BASE) MCG/ACT IN AERS: 1.0000 | INHALATION_SPRAY | Freq: Four times a day (QID) | RESPIRATORY_TRACT | Status: DC | PRN

## 2015-08-23 MED ORDER — ALPRAZOLAM 0.5 MG PO TABS: 0.5000 mg | ORAL_TABLET | Freq: Every day | ORAL | Status: DC | PRN

## 2015-08-23 MED ORDER — DOCUSATE SODIUM 100 MG PO CAPS
100.0000 mg | ORAL_CAPSULE | Freq: Two times a day (BID) | ORAL | Status: DC
  Administered 2015-08-23 – 2015-09-03 (×21): 100 mg via ORAL
  Filled 2015-08-23 (×23): qty 1

## 2015-08-23 MED ORDER — HYDROCODONE-ACETAMINOPHEN 5-325 MG PO TABS: 1.0000 | ORAL_TABLET | ORAL | Status: DC | PRN

## 2015-08-23 MED ORDER — BISACODYL 5 MG PO TBEC
5.0000 mg | DELAYED_RELEASE_TABLET | Freq: Every day | ORAL | Status: DC | PRN
  Administered 2015-08-27 – 2015-09-03 (×5): 5 mg via ORAL
  Filled 2015-08-23 (×5): qty 1

## 2015-08-23 MED ORDER — PSEUDOEPHEDRINE-GUAIFENESIN ER 120-1200 MG PO TB12: 1.0000 | ORAL_TABLET | Freq: Two times a day (BID) | ORAL | Status: DC

## 2015-08-23 MED ORDER — BUDESONIDE-FORMOTEROL FUMARATE 160-4.5 MCG/ACT IN AERO
2.0000 | INHALATION_SPRAY | Freq: Two times a day (BID) | RESPIRATORY_TRACT | Status: DC
  Administered 2015-08-24 – 2015-09-14 (×42): 2 via RESPIRATORY_TRACT
  Filled 2015-08-23 (×3): qty 6

## 2015-08-23 MED ORDER — ONDANSETRON HCL 4 MG/2ML IJ SOLN: 4.0000 mg | Freq: Four times a day (QID) | INTRAMUSCULAR | Status: DC | PRN

## 2015-08-23 NOTE — Progress Notes (Addendum)
2 RN changed patient from mini to Gabriel [name] pleurex per protocol. Applied to suction per orders. Upon assessment large air leak observed in chamber. Both clamps removed. Pt c/o of 8/10 pain and being SOB. 3rd RN came to assess chest tube and found no source of leak and assured all connections secured. Pt reported pain subsiding and breathing easier. Dr. Prescott Gum notified. VSS  Will continue to monitor.   Gabriel [name]   Dr. Prescott Gum ordered to continue with suction, vital signs q 2 hours and ensure there was chest x ray in the am.

## 2015-08-23 NOTE — H&P (Signed)
[address]       [place] [address]             [phone]        Gabriel [name] [place] Medical Record #[id number] Date of Birth: 07-19-1941  Referring: No ref. 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 He was last seen by Dr. Julien Nordmann on 07/25/2015 with treatment options were discussed at the time, he expects to start treatment with carboplatin and gemcitabine on 08/08/2015. [name] also has a history of a right-sided pneumothorax diagnosed on 04/04/2015 undergoing chest tube placement. He presented to the emergency room on previous admission  with complaints of severe dyspnea on exertion. Chest tube placed, he had prolonged air leak and was d/c home with chest tube. Attempt at IBV valves did not help. Now increased sob, o2 sat low after d/c and stred on o2. Chest xray in office today showed incewased ptx, plan admission and back on suction                                               Current Activity/ Functional Status: Patient is independent with mobility/ambulation, transfers, ADL's, IADL's.   Zubrod Score: At the time of surgery this patient's most appropriate activity status/level should be described as: '[]'$     0    Normal activity, no symptoms '[]'$     1    Restricted in physical strenuous activity but ambulatory, able to do out light work '[x]'$     2    Ambulatory and capable of self care, unable to do work activities, up and about                  more than 50%  Of the time                            '[]'$     3    Only limited self care, in bed greater than 50% of waking hours '[]'$     4    Completely disabled, no self care, confined to bed or chair '[]'$     5    Moribund  Past Medical History  Diagnosis Date  . Spontaneous pneumothorax MANY YRS AGO AND AGAIN 04/04/15    HISTORY OF (ONLY)  . Cancer ([place])     skin cancer  . Lung mass     right / HAS HAD RADIATION AND CHEMO FOR LUNG CANCER  . Inguinal hernia   . Productive cough     "DUE TO RADIATION"  . History of nonmelanoma skin cancer   . Constipation     AFTER ANESTHESIA  . Difficulty sleeping   . Radiation 05/11/15-06/21/15    NSCLCA/right lung    Past Surgical History  Procedure Laterality Date  . Hernia  repair  2011  . Colonoscopy w/ biopsies and polypectomy    . Wisdom tooth extraction    . Tonsillectomy    . Video bronchoscopy with endobronchial ultrasound N/A 04/18/2015    Procedure: VIDEO BRONCHOSCOPY WITH ENDOBRONCHIAL ULTRASOUND;  Surgeon: Grace Isaac, MD;  Location: [place];  Service: Thoracic;  Laterality: N/A;  . Inguinal hernia repair Left 07/15/2015    Procedure: OPEN LEFT INGUINAL HERNIA REPAIR;  Surgeon: Johnathan Hausen, MD;  Location: [facility];  Service: General;  Laterality: Left;  With MESH  . Video bronchoscopy with insertion of interbronchial valve (ibv) N/A 08/02/2015    Procedure: VIDEO BRONCHOSCOPY WITH ATTEMPTED INSERTION OF INTERBRONCHIAL VALVE (IBV) WITH FLUROSCOPY;  Surgeon: Grace Isaac, MD;  Location: [place];  Service: Thoracic;  Laterality: N/A;  . Chest tube insertion Right 08/02/2015    Procedure: INSERTION OF SECOND RIGHT CHEST TUBE WITH FLUROSCOPY;  Surgeon: Grace Isaac, MD;  Location: [place];  Service: Thoracic;  Laterality: Right;    History  Smoking status  . Current Every Day Smoker -- 0.50 packs/day for 55 years  . Types: Cigarettes  Smokeless tobacco  . Never Used    History  Alcohol Use  . Yes    Comment: every other night  -social    Social History   Social History  . Marital Status: Divorced    Spouse Name: N/A  . Number of Children: N/A  . Years of Education: N/A   Occupational History  . engineer    Social History Main Topics  . Smoking status: Current Every Day Smoker -- 0.50 packs/day for 55 years    Types: Cigarettes  . Smokeless tobacco: Never Used  . Alcohol Use: Yes     Comment: every other night -social  . Drug Use: No  . Sexual Activity: Not on file   Other Topics Concern  . Not on file   Social History Narrative    Allergies  Allergen Reactions  . Aleve [Naproxen] Rash    Current Facility-Administered Medications  Medication Dose Route Frequency Provider Last Rate Last Dose  . albuterol (PROVENTIL HFA;VENTOLIN HFA) 108 (90 BASE) MCG/ACT inhaler 1-2 puff  1-2 puff Inhalation Q6H PRN Grace Isaac, MD      . ALPRAZolam Duanne Moron) tablet 0.5 mg  0.5 mg Oral Daily PRN Grace Isaac, MD      . b complex vitamins tablet 1 tablet  1 tablet Oral Daily Grace Isaac, MD      . bisacodyl (DULCOLAX) EC tablet 5 mg  5 mg Oral Daily PRN Grace Isaac, MD      . budesonide-formoterol (SYMBICORT) 160-4.5 MCG/ACT inhaler 2 puff  2 puff Inhalation BID Grace Isaac, MD      . docusate sodium (COLACE) capsule 100 mg  100 mg Oral BID Grace Isaac, MD      . enoxaparin (LOVENOX) injection 30 mg  30 mg Subcutaneous Q24H Grace Isaac, MD      . HYDROcodone-acetaminophen (NORCO/VICODIN) 5-325 MG per tablet 1-2 tablet  1-2 tablet Oral Q4H PRN Grace Isaac, MD      . ondansetron [facility]) tablet 4 mg  4 mg Oral Q6H PRN Grace Isaac, MD       Or  . ondansetron [facility]) injection 4 mg  4 mg Intravenous Q6H PRN Grace Isaac, MD      . oxyCODONE (Oxy IR/ROXICODONE) immediate release tablet 5 mg  5 mg Oral Q8H PRN Lilia Argue  Servando Snare, MD      . Pseudoephedrine-Guaifenesin [phone] MG TB12 1 tablet  1 tablet Oral BID Grace Isaac, MD      . senna [facility])  tablet 8.6 mg  1 tablet Oral BID Grace Isaac, MD      . sodium chloride 0.9 % injection 3 mL  3 mL Intravenous Q12H Grace Isaac, MD      . vitamin C (ASCORBIC ACID) tablet 1,000 mg  1,000 mg Oral Daily Grace Isaac, MD        Prescriptions prior to admission  Medication Sig Dispense Refill Last Dose  . acetaminophen (TYLENOL) 325 MG tablet Take 2 tablets (650 mg total) by mouth every 6 (six) hours as needed for mild pain, moderate pain, fever or headache (or Fever >/= 101).   Taking  . albuterol (PROVENTIL HFA;VENTOLIN HFA) 108 (90 BASE) MCG/ACT inhaler Inhale 1-2 puffs into the lungs every 6 (six) hours as needed for wheezing or shortness of breath. 1 Inhaler 0 Taking  . ALPRAZolam (XANAX) 0.5 MG tablet Take 1 tablet (0.5 mg total) by mouth daily as needed for anxiety. 10 tablet 0 Taking  . Ascorbic Acid (VITAMIN C) 1000 MG tablet Take 1,000 mg by mouth daily.   Taking  . b complex vitamins tablet Take 1 tablet by mouth daily.   Taking  . budesonide-formoterol (SYMBICORT) 160-4.5 MCG/ACT inhaler Inhale 2 puffs into the lungs 2 (two) times daily. 1 Inhaler 0 Taking  . CALCIUM CITRATE PO Take 1 tablet by mouth daily.   Taking  . Cyanocobalamin (VITAMIN B 12 PO) Take 1 tablet by mouth daily.   Taking  . docusate sodium (COLACE) 100 MG capsule Take 100 mg by mouth 3 (three) times daily as needed for mild constipation.    Taking  . L-Arginine 1000 MG TABS Take 1,000 mg by mouth daily.   Taking  . magnesium 30 MG tablet Take 30 mg by mouth daily.   Taking  . Multiple Vitamin (MULTI VITAMIN DAILY PO) Take 1 tablet by mouth daily.   Taking  . Omega-3 Fatty Acids (FISH OIL) 1000 MG CAPS Take 1,000 mg by mouth daily.   Taking  . oxyCODONE (OXY IR/ROXICODONE) 5 MG immediate release tablet Take 1 tablet (5 mg total) by mouth every 8 (eight) hours as needed for severe pain. 15 tablet 0 Taking  . Pseudoephedrine-Guaifenesin (MUCINEX D) [phone] MG TB12 Take 1 tablet by mouth 2 (two) times  daily.     . vitamin A 10000 UNIT capsule Take 10,000 Units by mouth daily.   Taking    Family History  Problem Relation Age of Onset  . Heart attack Father   . Hypertension Mother   . Heart disease Father      Review of Systems:      Cardiac Review of Systems: Y or N  Chest Pain [  y  ]  Resting SOB [  y] Exertional SOB  [y]  Orthopnea [ y ]   Pedal Edema [  n]    Palpitations [ n ] Syncope  [n  ]   Presyncope [ n  ]  General Review of Systems: [Y] = yes [  ]=no Constitional: recent weight change [  ]; anorexia [  ]; fatigue [ y ]; nausea [  ]; night sweats [  ]; fever [  ]; or chills [  ]  Dental: poor dentition[  ]; Last Dentist visit:   Eye : blurred vision [  ]; diplopia [   ]; vision changes [  ];  Amaurosis fugax[  ]; Resp: cough [  y];  wheezing[ y ];  hemoptysis[  n; shortness of breath[ y ]; paroxysmal nocturnal dyspnea[  ]; dyspnea on exertion[y  ]; or orthopnea[y  ];  GI:  gallstones[  ], vomiting[  ];  dysphagia[  ]; melena[  ];  hematochezia [  ]; heartburn[  ];   Hx of  Colonoscopy[  ]; GU: kidney stones [  ]; hematuria[  ];   dysuria [  ];  nocturia[  ];  history of     obstruction [  ]; urinary frequency [  ]             Skin: rash, swelling[  ];, hair loss[  ];  peripheral edema[  ];  or itching[  ]; Musculosketetal: myalgias[  ];  joint swelling[  ];  joint erythema[  ];  joint pain[  ];  back pain[  ];  Heme/Lymph: bruising[  ];  bleeding[  ];  anemia[  ];  Neuro: TIA[  ];  headaches[  ];  stroke[  ];  vertigo[  ];  seizures[  ];   paresthesias[  ];  difficulty walking[  ];  Psych:depression[  ]; anxiety[  ];  Endocrine: diabetes[  ];  thyroid dysfunction[  ];  Immunizations: Flu [  ]; Pneumococcal[  ];  Other:  Physical Exam: BP 112/72 mmHg  Pulse 111  Temp(Src) 98.3 F (36.8 C) (Oral)  Resp 18  Ht '5\' 10"'$  (1.778 m)  Wt 133 lb (60.328 kg)  BMI 19.08 kg/m2  SpO2 100%   General appearance:  alert, cooperative and appears older than stated age Head: Normocephalic, without obvious abnormality, atraumatic Neck: no adenopathy, no carotid bruit, no JVD, supple, symmetrical, trachea midline and thyroid not enlarged, symmetric, no tenderness/mass/nodules Lymph nodes: Cervical, supraclavicular, and axillary nodes normal. Resp: diminished breath sounds RLL and RML Back: symmetric, no curvature. ROM normal. No CVA tenderness. Cardio: regular rate and rhythm, S1, S2 normal, no murmur, click, rub or gallop GI: soft, non-tender; bowel sounds normal; no masses,  no organomegaly Extremities: extremities normal, atraumatic, no cyanosis or edema Neurologic: Grossly normal  Diagnostic Studies & Laboratory data:     Recent Radiology Findings:   Dg Chest 2 View  08/23/2015  CLINICAL DATA:  History of lung malignancy, persistent right-sided pneumothorax treated with indwelling chest tube, increase shortness of breath today with exertion EXAM: CHEST  2 VIEW COMPARISON:  PA and lateral chest x-ray of August 18, 2015 FINDINGS: The pneumothorax on the right is again demonstrated and it has increased slightly in volume. It now amounts to approximately 50% of the lung volume. A small amount of pleural fluid persists at the right lung base. The right-sided chest tube tip projects over the medial aspect of the posterior tenth rib. The left lung is well-expanded. The interstitial markings are coarse. The heart and pulmonary vascularity are normal. IMPRESSION: Approximately 50% hydropneumothorax on the right which is mildly increased in volume since the previous study. The right-sided chest tube is unchanged in position. These results were called by telephone at the time of interpretation on 08/23/2015 at 3:41 pm to Marlana Latus, RN, , who verbally acknowledged these results. Electronically Signed   By: David  Martinique M.D.   On: 08/23/2015 15:42     I have independently reviewed the above radiologic  studies.  Recent Lab Findings: Lab Results  Component Value Date   WBC 8.5 08/17/2015   HGB 12.1* 08/17/2015   HCT 37.1* 08/17/2015   PLT 224 08/17/2015   GLUCOSE 92 08/17/2015   ALT 19 08/01/2015   AST 21 08/01/2015   NA 138 08/17/2015   K 4.0 08/17/2015   CL 101 08/17/2015   CREATININE 0.91 08/17/2015   BUN 14 08/17/2015   CO2 28 08/17/2015   INR 1.10 08/01/2015      Assessment / Plan:     Pneumothorax prolonged air leak   Active Problems:  Non-small cell carcinoma of right lung, stage 3  COPD (chronic obstructive pulmonary disease)    1. Spontaneous pneumothorax.  2. Stage III non-small cell carcinoma of right lung. He is currently receiving his care at the cancer center and has been followed by Dr. Julien Nordmann of medical oncology. He was treated with chemoradiation therapy undergoing treatment with carboplatin and paclitaxel having partial response. Plans have been made for him to initiate therapy with carboplatin and gemcitabine on 08/08/2015.  3. Chronic objective pulmonary disease. He does not appear to have acute issues regarding COPD. On bronchio dilators         Tobacco abuse. On o2 now not smoking  Code Status: Full code DVT Prophylaxis: Lovenox    [name]      [address] [place] [address] Office [phone]   Beeper [phone]  08/23/2015 6:11 PM

## 2015-08-24 ENCOUNTER — Inpatient Hospital Stay (HOSPITAL_COMMUNITY): Payer: 59

## 2015-08-24 ENCOUNTER — Encounter (HOSPITAL_COMMUNITY): Payer: Self-pay | Admitting: General Practice

## 2015-08-24 DIAGNOSIS — J939 Pneumothorax, unspecified: Secondary | ICD-10-CM

## 2015-08-24 DIAGNOSIS — J9311 Primary spontaneous pneumothorax: Secondary | ICD-10-CM

## 2015-08-24 HISTORY — DX: Pneumothorax, unspecified: J93.9

## 2015-08-24 MED ORDER — MORPHINE SULFATE (PF) 2 MG/ML IV SOLN
2.0000 mg | INTRAVENOUS | Status: DC | PRN
  Administered 2015-08-24: 2 mg via INTRAVENOUS
  Filled 2015-08-24 (×2): qty 1

## 2015-08-24 MED ORDER — MIDAZOLAM HCL 2 MG/2ML IJ SOLN
1.0000 mg | Freq: Once | INTRAMUSCULAR | Status: AC
  Administered 2015-08-24: 1 mg via INTRAVENOUS

## 2015-08-24 MED ORDER — HYDROMORPHONE HCL 1 MG/ML IJ SOLN
1.0000 mg | INTRAMUSCULAR | Status: DC | PRN
  Administered 2015-08-24 – 2015-08-26 (×7): 1 mg via INTRAVENOUS
  Filled 2015-08-24 (×7): qty 1

## 2015-08-24 MED ORDER — MORPHINE SULFATE (PF) 4 MG/ML IV SOLN: 4.0000 mg | Freq: Once | INTRAVENOUS | Status: DC

## 2015-08-24 MED ORDER — MORPHINE SULFATE (PF) 2 MG/ML IV SOLN
2.0000 mg | Freq: Once | INTRAVENOUS | Status: AC
  Administered 2015-08-24: 2 mg via INTRAVENOUS

## 2015-08-24 MED ORDER — FENTANYL CITRATE (PF) 100 MCG/2ML IJ SOLN
INTRAMUSCULAR | Status: AC
  Administered 2015-08-24: 25 ug
  Administered 2015-08-24: 12.5 ug
  Filled 2015-08-24: qty 2

## 2015-08-24 MED ORDER — MIDAZOLAM HCL 2 MG/2ML IJ SOLN: 2.0000 mg | Freq: Once | INTRAMUSCULAR | Status: DC

## 2015-08-24 MED ORDER — LIDOCAINE HCL 1 % IJ SOLN
INTRAMUSCULAR | Status: AC
  Filled 2015-08-24: qty 20

## 2015-08-24 MED ORDER — ENSURE ENLIVE PO LIQD
237.0000 mL | Freq: Two times a day (BID) | ORAL | Status: DC
  Administered 2015-08-25 – 2015-09-12 (×29): 237 mL via ORAL

## 2015-08-24 MED ORDER — OXYCODONE HCL 5 MG PO TABS
5.0000 mg | ORAL_TABLET | ORAL | Status: DC | PRN
  Administered 2015-08-24 – 2015-09-14 (×70): 5 mg via ORAL
  Filled 2015-08-24 (×72): qty 1

## 2015-08-24 MED ORDER — ALPRAZOLAM 0.5 MG PO TABS
0.5000 mg | ORAL_TABLET | Freq: Three times a day (TID) | ORAL | Status: DC | PRN
  Administered 2015-08-27 – 2015-09-13 (×17): 0.5 mg via ORAL
  Filled 2015-08-24 (×20): qty 1

## 2015-08-24 MED ORDER — MIDAZOLAM HCL 2 MG/2ML IJ SOLN
INTRAMUSCULAR | Status: AC
  Filled 2015-08-24: qty 2

## 2015-08-24 MED ORDER — MAGIC MOUTHWASH
15.0000 mL | Freq: Three times a day (TID) | ORAL | Status: DC
  Administered 2015-08-25 – 2015-09-14 (×54): 15 mL via ORAL
  Filled 2015-08-24 (×68): qty 15

## 2015-08-24 MED ORDER — LIDOCAINE HCL (PF) 1 % IJ SOLN
0.0000 mL | Freq: Once | INTRAMUSCULAR | Status: DC | PRN
  Administered 2015-08-25: 5 mL via INTRADERMAL
  Filled 2015-08-24: qty 30

## 2015-08-24 MED ORDER — MORPHINE SULFATE (PF) 4 MG/ML IV SOLN
INTRAVENOUS | Status: AC
  Filled 2015-08-24: qty 1

## 2015-08-24 NOTE — Procedures (Signed)
Interventional Radiology Procedure Note  Procedure:  Exchange and reposition of right chest tube.  A 78F Thall chest tube was ultimately positioned in the apex of the right pleural space.   Complications:  NOne  Estimated Blood Loss: 0  Recommendations: - Tube to LWS at 20 cm H2O   Signed,  Criselda Peaches, MD

## 2015-08-24 NOTE — Progress Notes (Signed)
Pt transferred to IR for chest tube manipulation.  Telebox with Pt.  CCMD notified.  Pt denies distress.

## 2015-08-24 NOTE — Progress Notes (Addendum)
Gabriel [name]       [place] [address]             [phone]          Subjective: Feels fairly well, but is SOB somewhat  Objective: Vital signs in last 24 hours: Temp:  [97.9 F (36.6 C)-98.8 F (37.1 C)] 98.8 F (37.1 C) (10/26 0544) Pulse Rate:  [94-112] 94 (10/26 0544) Cardiac Rhythm:  [-] Normal sinus rhythm (10/26 0700) Resp:  [18-19] 18 (10/26 0544) BP: (88-113)/(55-76) 96/58 mmHg (10/26 0544) SpO2:  [87 %-100 %] 92 % (10/26 0859) Weight:  [129 lb 10.1 oz (58.8 kg)-133 lb (60.328 kg)] 129 lb 10.1 oz (58.8 kg) (10/26 0544)  Hemodynamic parameters for last 24 hours:    Intake/Output from previous day:   Intake/Output this shift: Total I/O In: 240 [P.O.:240] Out: -   General appearance: alert, cooperative and no distress Heart: regular rate and rhythm Lungs: dim A/E on right Abdomen: benign  Lab Results:  Recent Labs  08/23/15 1852  WBC 16.3*  HGB 14.8  HCT 42.9  PLT 219   BMET:  Recent Labs  08/23/15 1852  NA 136  K 4.5  CL 103  CO2 25  GLUCOSE 125*  BUN 20  CREATININE 0.98  CALCIUM 9.7    PT/INR: No results for input(s): LABPROT, INR in the last 72 hours. ABG    Component Value Date/Time   TCO2 22 04/04/2015 1839   CBG (last 3)  No results for input(s): GLUCAP in the last 72 hours.  Meds Scheduled Meds: . antiseptic oral rinse  7 mL Mouth Rinse BID  . B-complex with vitamin C  1 tablet Oral Daily  . budesonide-formoterol  2 puff Inhalation BID  . docusate sodium  100 mg Oral BID  . enoxaparin (LOVENOX) injection  30 mg Subcutaneous QHS  . pseudoephedrine  120 mg Oral BID   And  . guaiFENesin  1,200 mg Oral BID  . senna  1 tablet Oral BID  . sodium chloride  3 mL Intravenous Q12H  . vitamin C  1,000 mg Oral Daily   Continuous Infusions:  PRN Meds:.albuterol, ALPRAZolam, bisacodyl, ondansetron **OR** ondansetron (ZOFRAN) IV, oxyCODONE  Xrays Dg Chest 2 View  08/23/2015  CLINICAL DATA:  History of  lung malignancy, persistent right-sided pneumothorax treated with indwelling chest tube, increase shortness of breath today with exertion EXAM: CHEST  2 VIEW COMPARISON:  PA and lateral chest x-ray of August 18, 2015 FINDINGS: The pneumothorax on the right is again demonstrated and it has increased slightly in volume. It now amounts to approximately 50% of the lung volume. A small amount of pleural fluid persists at the right lung base. The right-sided chest tube tip projects over the medial aspect of the posterior tenth rib. The left lung is well-expanded. The interstitial markings are coarse. The heart and pulmonary vascularity are normal. IMPRESSION: Approximately 50% hydropneumothorax on the right which is mildly increased in volume since the previous study. The right-sided chest tube is unchanged in position. These results were called by telephone at the time of interpretation on 08/23/2015 at 3:41 pm to Marlana Latus, RN, , who verbally acknowledged these results. Electronically Signed   By: David  Martinique M.D.   On: 08/23/2015 15:42   Portable Chest 1 View  08/24/2015  CLINICAL DATA:  Known right pneumothorax with chest tube treatment EXAM: PORTABLE CHEST 1 VIEW COMPARISON:  PA and lateral chest x-ray of August 23, 2015 FINDINGS: There is developed nearly 100% collapse of the right lung since yesterday's study. The chest tube is unchanged in position. There is no significant mediastinal shift. A small right pleural effusion is present. The left lung is clear. The heart is not enlarged. No left-sided pneumothorax is observed. Old deformities of the posterior lateral aspects of the left seventh and eighth ribs are again noted IMPRESSION: Total collapse of the right lung. The right chest tube is unchanged in position with its tip projecting over the posterior medial aspect of the right tenth rib. No significant mediastinal shift. Critical Value/emergent results were called by telephone at the time of  interpretation on 08/24/2015 at 7:44 am to Myrtie Hawk, RN, who verbally acknowledged these results. Electronically Signed   By: David  Martinique M.D.   On: 08/24/2015 07:46    Assessment/Plan:  1 Fairly stable but CXR shows near complete collapse of lung, tube appears to be obstructed with fibrinous debris, will need new tubed placed.     LOS: 1 day    GOLD,[name] 08/24/2015  increased ptx on xray this am even on suction Stripped tube with increased bubbles. Will place a new right chest tube , patient agreeable I have seen and examined [name] and agree with the above assessment  and plan.  [name] Beeper [phone] Office [phone] 08/24/2015 11:58 AM

## 2015-08-24 NOTE — Progress Notes (Signed)
Utilization review completed.  

## 2015-08-24 NOTE — Progress Notes (Signed)
[address]       [place] [address]             [phone]      Haden H Lhommedieu Fries Medical Record #[id number] Date of Birth: Apr 11, 1941  Referring: [name], MD Primary Care: Cammy Copa, MD  Chief Complaint:   Chest tube placement  History of Present Illness:     retuns to office after recent d/c from hospital, after d/c had increased sob and decreased o2 sat, primary care got o2 ordered. Which helped, comes today for follow hospital visti     Past Medical History  Diagnosis Date  . Spontaneous pneumothorax MANY YRS AGO AND AGAIN 04/04/15    HISTORY OF (ONLY)  . Cancer ([place])     skin cancer  . Lung mass     right / HAS HAD RADIATION AND CHEMO FOR LUNG CANCER  . Inguinal hernia   . Productive cough     "DUE TO RADIATION"  . History of nonmelanoma skin cancer   . Constipation     AFTER ANESTHESIA  . Difficulty sleeping   . Radiation 05/11/15-06/21/15    NSCLCA/right lung     History  Smoking status  . Current Every Day Smoker -- 0.50 packs/day for 55 years  . Types: Cigarettes  Smokeless tobacco  . Never Used    History  Alcohol Use  . Yes    Comment: every other night -social     Allergies  Allergen Reactions  . Aleve [Naproxen] Rash    No current facility-administered medications for this visit.   No current outpatient prescriptions on file.   Facility-Administered Medications Ordered in Other Visits  Medication Dose Route Frequency Provider Last Rate Last Dose  . albuterol (PROVENTIL) (2.5 MG/3ML) 0.083% nebulizer solution 2.5 mg  2.5 mg Nebulization Q6H PRN Grace Isaac, MD      . ALPRAZolam Duanne Moron) tablet 0.5 mg  0.5 mg Oral TID PRN John Giovanni, PA-C      . B-complex with vitamin C tablet 1 tablet  1 tablet Oral Daily Grace Isaac, MD   1 tablet at 08/24/15 [phone]  . bisacodyl (DULCOLAX) EC tablet 5 mg  5 mg Oral Daily PRN Grace Isaac, MD      . budesonide-formoterol [facility])  160-4.5 MCG/ACT inhaler 2 puff  2 puff Inhalation BID Grace Isaac, MD   2 puff at 08/24/15 [phone]  . docusate sodium (COLACE) capsule 100 mg  100 mg Oral BID Grace Isaac, MD   100 mg at 08/23/15 2217  . enoxaparin (LOVENOX) injection 30 mg  30 mg Subcutaneous QHS Grace Isaac, MD   30 mg at 08/23/15 2216  . fentaNYL (SUBLIMAZE) 100 MCG/2ML injection           . pseudoephedrine (SUDAFED) 12 hr tablet 120 mg  120 mg Oral BID Grace Isaac, MD   120 mg at 08/23/15 2216   And  . guaiFENesin (MUCINEX) 12 hr tablet 1,200 mg  1,200 mg Oral BID Grace Isaac, MD   1,200 mg at 08/24/15 6767  . lidocaine (PF) (XYLOCAINE) 1 % injection 0-30 mL  0-30 mL Intradermal Once PRN John Giovanni, PA-C      . magic mouthwash  15 mL Oral TID Grace Isaac, MD      . midazolam (VERSED) 2 MG/2ML injection           . morphine 2 MG/ML  injection 2 mg  2 mg Intravenous Q4H PRN Grace Isaac, MD      . morphine 4 MG/ML injection           . ondansetron (ZOFRAN) tablet 4 mg  4 mg Oral Q6H PRN Grace Isaac, MD       Or  . ondansetron [facility]) injection 4 mg  4 mg Intravenous Q6H PRN Grace Isaac, MD      . oxyCODONE (Oxy IR/ROXICODONE) immediate release tablet 5 mg  5 mg Oral Q4H PRN John Giovanni, PA-C   5 mg at 08/24/15 1418  . senna (SENOKOT) tablet 8.6 mg  1 tablet Oral BID Grace Isaac, MD   8.6 mg at 08/23/15 2216  . sodium chloride 0.9 % injection 3 mL  3 mL Intravenous Q12H Grace Isaac, MD   3 mL at 08/24/15 1000  . vitamin C (ASCORBIC ACID) tablet 1,000 mg  1,000 mg Oral Daily Grace Isaac, MD   1,000 mg at 08/24/15 4332       Physical Exam: BP 113/76 mmHg  Pulse 112  Ht '5\' 10"'$  (1.778 m)  Wt 133 lb (60.328 kg)  BMI 19.08 kg/m2  SpO2 87%  General appearance: alert and cooperative Neurologic: intact Heart: regular rate and rhythm, S1, S2 normal, no murmur, click, rub or gallop Lungs: diminished breath sounds RLL and RML Abdomen: soft, non-tender;  bowel sounds normal; no masses,  no organomegaly Extremities: extremities normal, atraumatic, no cyanosis or edema and Homans sign is negative, no sign of DVT Wound: air leak present   Diagnostic Studies & Laboratory data:     Recent Radiology Findings:   Dg Chest 2 View  08/23/2015  CLINICAL DATA:  History of lung malignancy, persistent right-sided pneumothorax treated with indwelling chest tube, increase shortness of breath today with exertion EXAM: CHEST  2 VIEW COMPARISON:  PA and lateral chest x-ray of August 18, 2015 FINDINGS: The pneumothorax on the right is again demonstrated and it has increased slightly in volume. It now amounts to approximately 50% of the lung volume. A small amount of pleural fluid persists at the right lung base. The right-sided chest tube tip projects over the medial aspect of the posterior tenth rib. The left lung is well-expanded. The interstitial markings are coarse. The heart and pulmonary vascularity are normal. IMPRESSION: Approximately 50% hydropneumothorax on the right which is mildly increased in volume since the previous study. The right-sided chest tube is unchanged in position. These results were called by telephone at the time of interpretation on 08/23/2015 at 3:41 pm to Marlana Latus, RN, , who verbally acknowledged these results. Electronically Signed   By: David  Martinique M.D.   On: 08/23/2015 15:42      Recent Lab Findings: Lab Results  Component Value Date   WBC 16.3* 08/23/2015   HGB 14.8 08/23/2015   HCT 42.9 08/23/2015   PLT 219 08/23/2015   GLUCOSE 125* 08/23/2015   ALT 19 08/23/2015   AST 20 08/23/2015   NA 136 08/23/2015   K 4.5 08/23/2015   CL 103 08/23/2015   CREATININE 0.98 08/23/2015   BUN 20 08/23/2015   CO2 25 08/23/2015   INR 1.10 08/01/2015      Assessment / Plan:      Plan admission see h& P of same day, replace back on suction and follow up chest xray      [name]      [address] [place] [address] Office [phone]   Beeper [phone]  08/24/2015 3:14 PM

## 2015-08-24 NOTE — Care Management Note (Signed)
Case Management Note [name] RN, BSN Unit 2W-Case Manager [phone]  Patient Details  Name: Gabriel [name] MRN: [id number] Date of Birth: February 18, 1941  Subjective/Objective:    Pt admitted with increase in Pntx, persistent airleak                Action/Plan: PTA pt lived at home- was active with [facility] for [place] - with chest tube in place at discharge to [facility]- pt will need resumption orders for discharge- if he returns home again with chest tube in place- NCM to follow for d/c needs  Expected Discharge Date:                  Expected Discharge Plan:  [place]  In-House Referral:     Discharge planning Services  CM Consult  Post Acute Care Choice:  Resumption of Svcs/PTA Provider Choice offered to:     DME Arranged:    DME Agency:     HH Arranged:    [place] Agency:  [place]  Status of Service:  In process, will continue to follow  Medicare Important Message Given:    Date Medicare IM Given:    Medicare IM give by:    Date Additional Medicare IM Given:    Additional Medicare Important Message give by:     If discussed at [place] of Stay Meetings, dates discussed:    Additional Comments:  Dawayne Patricia, RN 08/24/2015, 9:50 AM

## 2015-08-24 NOTE — Procedures (Addendum)
      [address]       [place] [address]             [phone]      Chest Tube Insertion Procedure Note  Indications:  Clinically significant Pneumothorax  Pre-operative Diagnosis: Pneumothorax  Post-operative Diagnosis: Pneumothorax  Procedure Details  Informed consent was obtained for the procedure, including sedation.  Risks of lung perforation, hemorrhage, arrhythmia, and adverse drug reaction were discussed.   After sterile skin prep, using standard technique, a 28 French tube was placed in the right lateral 6 rib space.  Findings: Air obtained  Estimated Blood Loss:  Minimal         Specimens:  None              Complications:  None; patient tolerated the procedure well.         Disposition: done in room         Condition: stable  Attending Attestation: I performed the procedure.  Follow up film chest tube goes down rather then up, discussed with IR to reposition under fluro guided   To get lung up

## 2015-08-25 ENCOUNTER — Inpatient Hospital Stay (HOSPITAL_COMMUNITY): Payer: 59

## 2015-08-25 DIAGNOSIS — J9311 Primary spontaneous pneumothorax: Secondary | ICD-10-CM

## 2015-08-25 LAB — BASIC METABOLIC PANEL
Anion gap: 7 (ref 5–15)
BUN: 17 mg/dL (ref 6–20)
CO2: 24 mmol/L (ref 22–32)
Calcium: 8.5 mg/dL — ABNORMAL LOW (ref 8.9–10.3)
Chloride: 99 mmol/L — ABNORMAL LOW (ref 101–111)
Creatinine, Ser: 0.99 mg/dL (ref 0.61–1.24)
GFR calc Af Amer: >60 mL/min (ref >60)
GFR calc non Af Amer: >60 mL/min (ref >60)
Glucose, Bld: 146 mg/dL — ABNORMAL HIGH (ref 65–99)
Potassium: 4.3 mmol/L (ref 3.5–5.1)
Sodium: 130 mmol/L — ABNORMAL LOW (ref 135–145)

## 2015-08-25 LAB — CBC
HCT: 35.9 % — ABNORMAL LOW (ref 39.0–52.0)
Hemoglobin: 12.3 g/dL — ABNORMAL LOW (ref 13.0–17.0)
MCH: 31.6 pg (ref 26.0–34.0)
MCHC: 34.3 g/dL (ref 30.0–36.0)
MCV: 92.3 fL (ref 78.0–100.0)
Platelets: 170 10*3/uL (ref 150–400)
RBC: 3.89 MIL/uL — ABNORMAL LOW (ref 4.22–5.81)
RDW: 14.7 % (ref 11.5–15.5)
WBC: 12.4 10*3/uL — ABNORMAL HIGH (ref 4.0–10.5)

## 2015-08-25 MED ORDER — LIDOCAINE HCL (PF) 1 % IJ SOLN
INTRAMUSCULAR | Status: AC
  Administered 2015-08-25: 5 mL via INTRADERMAL
  Filled 2015-08-25: qty 5

## 2015-08-25 MED ORDER — TRAMADOL HCL 50 MG PO TABS
50.0000 mg | ORAL_TABLET | Freq: Four times a day (QID) | ORAL | Status: DC | PRN
  Administered 2015-08-25 – 2015-09-02 (×4): 50 mg via ORAL
  Filled 2015-08-25 (×4): qty 1

## 2015-08-25 NOTE — Progress Notes (Addendum)
      [address]       [place] [address]             [phone]          Subjective: Events with chest tube placement from yesterday noted. Patient had a lot of pain at chest tube sites yesterday. He stated Dilaudid has really helped.  Objective: Vital signs in last 24 hours: Temp:  [97.7 F (36.5 C)-99 F (37.2 C)] 99 F (37.2 C) (10/27 0431) Pulse Rate:  [94-100] 95 (10/27 0431) Cardiac Rhythm:  [-] Normal sinus rhythm (10/26 2005) Resp:  [18-31] 22 (10/27 0431) BP: (86-114)/(45-69) 112/59 mmHg (10/27 0431) SpO2:  [92 %-97 %] 94 % (10/27 0431) Weight:  [129 lb 8 oz (58.741 kg)] 129 lb 8 oz (58.741 kg) (10/27 0431)     Intake/Output from previous day: 10/26 0701 - 10/27 0700 In: 240 [P.O.:240] Out: 505 [Chest Tube:505]   Physical Exam:  Cardiovascular: RRR Pulmonary: Coarse breath sounds on the right and clear on the left. Abdomen: Soft, non tender, bowel sounds present. Extremities: Mild bilateral lower extremity edema. Wounds: Clean and dry.  No erythema or signs of infection. Chest Tubes: Chest tubes are to suction. +4 air leak from posterior chest tube. Anterior chest tube has no air leak.  Lab Results: CBC: Recent Labs  08/23/15 1852 08/25/15 0234  WBC 16.3* 12.4*  HGB 14.8 12.3*  HCT 42.9 35.9*  PLT 219 170   BMET:  Recent Labs  08/23/15 1852 08/25/15 0234  NA 136 130*  K 4.5 4.3  CL 103 99*  CO2 25 24  GLUCOSE 125* 146*  BUN 20 17  CREATININE 0.98 0.99  CALCIUM 9.7 8.5*    PT/INR: No results for input(s): LABPROT, INR in the last 72 hours. ABG:  INR: Will add last result for INR, ABG once components are confirmed Will add last 4 CBG results once components are confirmed  Assessment/Plan:  1. CV - SR in the 90's. 2.  Pulmonary - On 3 liters of oxygen via [place].Chest tube 500 cc of output last 24 hours;260 cc last 12 hours. Chest tubes are to suction. There is a 4+ air leak. CXR appears to show right lung is mostly re  expanded (small trace right apical pneumothorax). Chest tubes to remain. 3. Mild hyponatremia-sodium is 130 4.  H and H 12.3 and 35.9  [name],Gabriel [name] 08/25/2015,8:09 AM  D/c anterior tube today, pull posterior chest tube back 1-11/2 inches Lung inflated today  I have seen and examined Gabriel [name] and agree with the above assessment  and plan.  [name] Beeper [phone] Office [phone] 08/25/2015 9:08 AM

## 2015-08-25 NOTE — Progress Notes (Addendum)
Nurse called to say Gabriel [name] was concerned about an occasional "whistling noise" he heard. He denies any change in breathing from earlier. Chest tube connections are all intact. Vaseline gauze around chest tube site with 2 sutures in place. I did not appreciate any noise except suction sound. He continues to have a 4+ persistent air leak. Oxygen saturation was 93% on 3 liters, which is similar to earlier this am. He is not in any distress. He appears to have lung sounds from base to apex on the right. There is no subcutaneous emphysema. Will check a CXR and will continue to monitor.

## 2015-08-25 NOTE — Progress Notes (Signed)
Pt had two chest tubes at beginning of night shift.  The older chest tube was the only one charted in the flowsheets.  Chest tube put in 08/24/2015 is now marked in the flowsheet Gabriel [name]

## 2015-08-26 ENCOUNTER — Inpatient Hospital Stay (HOSPITAL_COMMUNITY): Payer: 59

## 2015-08-26 NOTE — Progress Notes (Signed)
Upon re-assessment at 0500, pt's water seal chamber showed slight increase in bubbling. Pt's lungs sounds were at baseline and pt. Showed no sign of distress. Pt's oxygen saturation was 91% on 3 liters nasal canula. Oxygen was increased to 4 liters. Patients oxygen saturation then went up to 95%. After 10 minutes patient was moved back down to 3 liters where he has sustained 95% and above for the past hour. Will continue to monitor.   Alex Gardener, RN

## 2015-08-26 NOTE — Progress Notes (Addendum)
      [address]       [place] [address]             [phone]          Subjective: Patient states Dilaudid helps with pain best but he tries to only take at night as it "knocks him out".  Objective: Vital signs in last 24 hours: Temp:  [97.4 F (36.3 C)-98.9 F (37.2 C)] 98 F (36.7 C) (10/28 0502) Pulse Rate:  [89-109] 98 (10/28 0502) Cardiac Rhythm:  [-] Normal sinus rhythm (10/27 1907) Resp:  [17-20] 17 (10/28 0502) BP: (94-106)/(64-66) 94/64 mmHg (10/28 0502) SpO2:  [91 %-95 %] 91 % (10/28 0502) Weight:  [132 lb 8 oz (60.102 kg)] 132 lb 8 oz (60.102 kg) (10/28 0502)     Intake/Output from previous day: 10/27 0701 - 10/28 0700 In: 480 [P.O.:480] Out: 1010 [Urine:600; Chest Tube:410]   Physical Exam:  Cardiovascular: RRR Pulmonary: Diminished at bases R>L Abdomen: Soft, non tender, bowel sounds present. Extremities: Mild bilateral lower extremity edema. Wounds: Clean and dry.  No erythema or signs of infection. Chest Tubes: Chest tube is to suction. +4 air leak from chest tube.   Lab Results: CBC:  Recent Labs  08/23/15 1852 08/25/15 0234  WBC 16.3* 12.4*  HGB 14.8 12.3*  HCT 42.9 35.9*  PLT 219 170   BMET:   Recent Labs  08/23/15 1852 08/25/15 0234  NA 136 130*  K 4.5 4.3  CL 103 99*  CO2 25 24  GLUCOSE 125* 146*  BUN 20 17  CREATININE 0.98 0.99  CALCIUM 9.7 8.5*    PT/INR: No results for input(s): LABPROT, INR in the last 72 hours. ABG:  INR: Will add last result for INR, ABG once components are confirmed Will add last 4 CBG results once components are confirmed  Assessment/Plan:  1. CV - SR in the 90's. 2.  Pulmonary - On 3 liters of oxygen via [place].Chest tube with 410 cc last 24 hours. Chest tube is to suction. There is a 4+ air leak, which he has had. CXR appears to show right lung is mostly re expanded (small trace right apical pneumothorax), right lower lobe atelectasis/consolidation. Chest tube to remain to  suction for now. 3. Mild hyponatremia-sodium is 130. Re check in am 4.  H and H 12.3 and 35.9  Gabriel [name],Gabriel [name] MPA-C 08/26/2015,7:26 AM   Chest tube decreased to suction 10 Consider talc Monday  I have seen and examined Gabriel [name] and agree with the above assessment  and plan.  [name] Beeper [phone] Office [phone] 08/26/2015 3:35 PM

## 2015-08-27 ENCOUNTER — Inpatient Hospital Stay (HOSPITAL_COMMUNITY): Payer: 59

## 2015-08-27 LAB — BASIC METABOLIC PANEL
Anion gap: 9 (ref 5–15)
BUN: 27 mg/dL — ABNORMAL HIGH (ref 6–20)
CALCIUM: 9 mg/dL (ref 8.9–10.3)
CO2: 26 mmol/L (ref 22–32)
CREATININE: 1.15 mg/dL (ref 0.61–1.24)
Chloride: 94 mmol/L — ABNORMAL LOW (ref 101–111)
GFR calc non Af Amer: >60 mL/min (ref >60)
Glucose, Bld: 127 mg/dL — ABNORMAL HIGH (ref 65–99)
Potassium: 4.1 mmol/L (ref 3.5–5.1)
SODIUM: 129 mmol/L — AB (ref 135–145)

## 2015-08-27 NOTE — Progress Notes (Addendum)
      [address]       [place] [address]             [phone]          Subjective: Patient states breathing is ok this am  Objective: Vital signs in last 24 hours: Temp:  [98.1 F (36.7 C)-98.7 F (37.1 C)] 98.6 F (37 C) (10/29 0449) Pulse Rate:  [95-105] 95 (10/29 0449) Cardiac Rhythm:  [-] Normal sinus rhythm (10/29 0730) Resp:  [18-20] 20 (10/29 0449) BP: (95-114)/(60-69) 95/60 mmHg (10/29 0449) SpO2:  [92 %-95 %] 94 % (10/29 0449) Weight:  [133 lb 4.8 oz (60.464 kg)] 133 lb 4.8 oz (60.464 kg) (10/29 0449)     Intake/Output from previous day: 10/28 0701 - 10/29 0700 In: 66599 [P.O.:13090] Out: 1090 [Urine:900; Chest Tube:190]   Physical Exam:  Cardiovascular: RRR Pulmonary: Diminished at bases R>L Abdomen: Soft, non tender, bowel sounds present. Extremities: Mild bilateral lower extremity edema. Wounds: Clean and dry.  No erythema or signs of infection. Chest Tubes: Chest tube is to suction. +4 air leak from chest tube.   Lab Results: CBC:  Recent Labs  08/25/15 0234  WBC 12.4*  HGB 12.3*  HCT 35.9*  PLT 170   BMET:   Recent Labs  08/25/15 0234 08/27/15 0332  NA 130* 129*  K 4.3 4.1  CL 99* 94*  CO2 24 26  GLUCOSE 146* 127*  BUN 17 27*  CREATININE 0.99 1.15  CALCIUM 8.5* 9.0    PT/INR: No results for input(s): LABPROT, INR in the last 72 hours. ABG:  INR: Will add last result for INR, ABG once components are confirmed Will add last 4 CBG results once components are confirmed  Assessment/Plan:  1. CV - SR in the 90's. 2.  Pulmonary - On 3 liters of oxygen via [place].Chest tube with 190 cc last 24 hours. Chest tube is to 10 cmsuction. There is a 4+ air leak, which he has had. CXR appears stable (small trace right apical pneumothorax and right lower lobe atelectasis/consolidation.) Chest tube to remain to suction for now. Likely TALC on Monday. 3. Mild hyponatremia-sodium remains around 129 4.  H and H 12.3 and  35.9  ZIMMERMAN,DONIELLE MPA-C 08/27/2015,8:51 AM   I have seen and examined the patient and agree with the assessment and plan as outlined.  Rexene Alberts, MD 08/27/2015 12:55 PM

## 2015-08-27 NOTE — Care Management Important Message (Signed)
Important Message  Patient Details  Name: Gabriel [name] MRN: [id number] Date of Birth: 09/24/1941   Medicare Important Message Given:  Yes-second notification given    Norina Buzzard, RN 08/27/2015, 11:50 AM

## 2015-08-28 ENCOUNTER — Inpatient Hospital Stay (HOSPITAL_COMMUNITY): Payer: 59

## 2015-08-28 NOTE — Progress Notes (Addendum)
      [address]       [place] [address]             [phone]          Subjective: Patient states breathing is ok this am. He does notice some more coughing.  Objective: Vital signs in last 24 hours: Temp:  [98.5 F (36.9 C)-99.1 F (37.3 C)] 99.1 F (37.3 C) (10/30 0427) Pulse Rate:  [94-103] 103 (10/30 0427) Cardiac Rhythm:  [-] Sinus tachycardia (10/30 0700) Resp:  [18-19] 18 (10/30 0427) BP: (97-110)/(48-74) 99/48 mmHg (10/30 0427) SpO2:  [92 %-96 %] 92 % (10/30 0427) Weight:  [131 lb 2.8 oz (59.5 kg)] 131 lb 2.8 oz (59.5 kg) (10/30 0427)     Intake/Output from previous day: 10/29 0701 - 10/30 0700 In: 243 [P.O.:240; I.V.:3] Out: 810 [Urine:750; Chest Tube:60]   Physical Exam:  Cardiovascular: RRR Pulmonary: Diminished at bases R>L Abdomen: Soft, non tender, bowel sounds present. Extremities: No lower extremity edema. Wounds: Clean and dry.  No erythema or signs of infection. Chest Tubes: Chest tube is to suction. +4 air leak from chest tube.   Lab Results: CBC: No results for input(s): WBC, HGB, HCT, PLT in the last 72 hours. BMET:   Recent Labs  08/27/15 0332  NA 129*  K 4.1  CL 94*  CO2 26  GLUCOSE 127*  BUN 27*  CREATININE 1.15  CALCIUM 9.0    PT/INR: No results for input(s): LABPROT, INR in the last 72 hours. ABG:  INR: Will add last result for INR, ABG once components are confirmed Will add last 4 CBG results once components are confirmed  Assessment/Plan:  1. CV - SR/ST in the 90's-low 100's 2.  Pulmonary - On 3 liters of oxygen via [place].Chest tube with 60 cc last 24 hours. Chest tube is to 10 cm suction. There is a 4+ air leak, which he has had. CXR appears stable (small trace right apical pneumothorax and right lower lobe atelectasis/consolidation.) Chest tube to remain to suction for now. Likely TALC in am. Mucinex for cough 3. Mild hyponatremia-sodium remains around 129. Re check in am 4.  Last H and H 12.3 and  35.9  ZIMMERMAN,DONIELLE MPA-C 08/28/2015,9:07 AM   I have seen and examined the patient and agree with the assessment and plan as outlined.  Rexene Alberts, MD 08/28/2015 1:33 PM

## 2015-08-28 NOTE — Progress Notes (Signed)
Utilization review completed.  

## 2015-08-29 ENCOUNTER — Inpatient Hospital Stay (HOSPITAL_COMMUNITY): Payer: 59

## 2015-08-29 ENCOUNTER — Ambulatory Visit: Payer: 59

## 2015-08-29 ENCOUNTER — Other Ambulatory Visit: Payer: 59

## 2015-08-29 ENCOUNTER — Ambulatory Visit: Payer: 59 | Admitting: Internal Medicine

## 2015-08-29 DIAGNOSIS — J9311 Primary spontaneous pneumothorax: Secondary | ICD-10-CM

## 2015-08-29 NOTE — Progress Notes (Addendum)
[address]       [place] [address]             [phone]          Subjective: Feels ok, no new issues  Objective: Vital signs in last 24 hours: Temp:  [98.3 F (36.8 C)-100.4 F (38 C)] 98.3 F (36.8 C) (10/31 0413) Pulse Rate:  [92-100] 92 (10/31 0413) Cardiac Rhythm:  [-] Sinus tachycardia (10/30 1900) Resp:  [17-27] 21 (10/31 0413) BP: (102-103)/(52-56) 103/56 mmHg (10/31 0413) SpO2:  [90 %-95 %] 95 % (10/31 0413) Weight:  [132 lb 11.5 oz (60.2 kg)] 132 lb 11.5 oz (60.2 kg) (10/31 0413)  Hemodynamic parameters for last 24 hours:    Intake/Output from previous day: 10/30 0701 - 10/31 0700 In: -  Out: 365 [Urine:225; Chest Tube:140] Intake/Output this shift:    General appearance: alert, cooperative and no distress Heart: regular rate and rhythm Lungs: dim in right lower fields Abdomen: benign Extremities: no edema Wound: chest tube in place, + air leak  Lab Results: No results for input(s): WBC, HGB, HCT, PLT in the last 72 hours. BMET:  Recent Labs  08/27/15 0332  NA 129*  K 4.1  CL 94*  CO2 26  GLUCOSE 127*  BUN 27*  CREATININE 1.15  CALCIUM 9.0    PT/INR: No results for input(s): LABPROT, INR in the last 72 hours. ABG    Component Value Date/Time   TCO2 22 04/04/2015 1839   CBG (last 3)  No results for input(s): GLUCAP in the last 72 hours.  Meds Scheduled Meds: . B-complex with vitamin C  1 tablet Oral Daily  . budesonide-formoterol  2 puff Inhalation BID  . docusate sodium  100 mg Oral BID  . enoxaparin (LOVENOX) injection  30 mg Subcutaneous QHS  . feeding supplement (ENSURE ENLIVE)  237 mL Oral BID BM  . guaiFENesin  1,200 mg Oral BID  . magic mouthwash  15 mL Oral TID  . senna  1 tablet Oral BID  . sodium chloride  3 mL Intravenous Q12H  . vitamin C  1,000 mg Oral Daily   Continuous Infusions:  PRN Meds:.albuterol, ALPRAZolam, bisacodyl, HYDROmorphone (DILAUDID) injection, ondansetron **OR**  ondansetron (ZOFRAN) IV, oxyCODONE, traMADol  Xrays Dg Chest Port 1 View  08/29/2015  CLINICAL DATA:  Right-sided pneumothorax, chest tube treatment. EXAM: PORTABLE CHEST 1 VIEW COMPARISON:  Portable chest x-ray of October 30th 2016 FINDINGS: There remains volume loss on the right. No pneumothorax is evident. There is persistent interstitial density in the mid and lower right lung. A small right pleural effusion is suspected. The right-sided chest tube tip projects over the medial aspect of the posterior right fifth rib. The left lung is clear. Old left posterior rib fractures are present. The heart is normal where visualized. The pulmonary vascularity is not engorged. IMPRESSION: No right-sided pneumothorax is evident. The right-sided chest tube is stable in position. There is persistent infiltrate or atelectasis in the lower third of the right lung. Electronically Signed   By: David  Martinique M.D.   On: 08/29/2015 07:36   Dg Chest Port 1 View  08/28/2015  CLINICAL DATA:  Pneumothorax.  Right-sided chest pain. EXAM: PORTABLE CHEST 1 VIEW COMPARISON:  08/27/2015 FINDINGS: Right-sided chest tube is in place. No significant pneumothorax identified. Airspace consolidation within the right base and right pleural fluid is unchanged. Left lung is relatively clear. IMPRESSION: 1. Stable appearance of the right hemi thorax compared  with previous exam. Right chest tube remains in place without significant pneumothorax. 2. Persistent right base pneumonia. Electronically Signed   By: Kerby Moors M.D.   On: 08/28/2015 09:05    Assessment/Plan:  1 lung is expanded ,+ air leak, considering talc   LOS: 6 days    GOLD,[name] 08/29/2015  Weaker today, not as "bright". Sat low during night Sputum culture now, consolidation right lower lobe Ct to water seal I have seen and examined Gabriel [name] and agree with the above assessment  and plan.  [name] Beeper [phone] Office  [phone] 08/29/2015 2:56 PM

## 2015-08-30 ENCOUNTER — Inpatient Hospital Stay (HOSPITAL_COMMUNITY): Payer: 59

## 2015-08-30 DIAGNOSIS — J9311 Primary spontaneous pneumothorax: Secondary | ICD-10-CM

## 2015-08-30 LAB — CBC
HCT: 32 % — ABNORMAL LOW (ref 39.0–52.0)
Hemoglobin: 10.6 g/dL — ABNORMAL LOW (ref 13.0–17.0)
MCH: 30 pg (ref 26.0–34.0)
MCHC: 33.1 g/dL (ref 30.0–36.0)
MCV: 90.7 fL (ref 78.0–100.0)
Platelets: 187 10*3/uL (ref 150–400)
RBC: 3.53 MIL/uL — ABNORMAL LOW (ref 4.22–5.81)
RDW: 15.1 % (ref 11.5–15.5)
WBC: 13.6 10*3/uL — ABNORMAL HIGH (ref 4.0–10.5)

## 2015-08-30 LAB — BASIC METABOLIC PANEL
Anion gap: 10 (ref 5–15)
BUN: 16 mg/dL (ref 6–20)
CO2: 25 mmol/L (ref 22–32)
Calcium: 8.7 mg/dL — ABNORMAL LOW (ref 8.9–10.3)
Chloride: 97 mmol/L — ABNORMAL LOW (ref 101–111)
Creatinine, Ser: 0.88 mg/dL (ref 0.61–1.24)
GFR calc Af Amer: >60 mL/min (ref >60)
GFR calc non Af Amer: >60 mL/min (ref >60)
Glucose, Bld: 130 mg/dL — ABNORMAL HIGH (ref 65–99)
Potassium: 4.2 mmol/L (ref 3.5–5.1)
Sodium: 132 mmol/L — ABNORMAL LOW (ref 135–145)

## 2015-08-30 LAB — LACTIC ACID, PLASMA: LACTIC ACID, VENOUS: 1 mmol/L (ref 0.5–2.0)

## 2015-08-30 LAB — PROCALCITONIN: PROCALCITONIN: 2.21 ng/mL

## 2015-08-30 LAB — EXPECTORATED SPUTUM ASSESSMENT W GRAM STAIN, RFLX TO RESP C

## 2015-08-30 MED ORDER — PIPERACILLIN-TAZOBACTAM 3.375 G IVPB
3.3750 g | Freq: Three times a day (TID) | INTRAVENOUS | Status: DC
  Administered 2015-08-30 – 2015-09-06 (×21): 3.375 g via INTRAVENOUS
  Filled 2015-08-30 (×24): qty 50

## 2015-08-30 MED ORDER — VANCOMYCIN HCL IN DEXTROSE 750-5 MG/150ML-% IV SOLN
750.0000 mg | Freq: Two times a day (BID) | INTRAVENOUS | Status: DC
  Administered 2015-08-30 – 2015-09-05 (×12): 750 mg via INTRAVENOUS
  Filled 2015-08-30 (×14): qty 150

## 2015-08-30 NOTE — Progress Notes (Signed)
Pt refusing to wear continuos pulse ox. Sats checked. Pt at 100% on 4L. Will continue to monitor closely.  Raliegh Ip RN

## 2015-08-30 NOTE — Progress Notes (Signed)
Pt O2 saturation 90% on continuos pulse ox. O2 incresed to 4LO2. Will continue to monitor closely.  Raliegh Ip RN

## 2015-08-30 NOTE — Progress Notes (Addendum)
      [address]       [place] [address]             [phone]          Subjective: I spoke with patient that he likely has PNA.   Objective: Vital signs in last 24 hours: Temp:  [98.7 F (37.1 C)-98.8 F (37.1 C)] 98.8 F (37.1 C) (11/01 0310) Pulse Rate:  [96-104] 102 (11/01 0310) Cardiac Rhythm:  [-] Sinus tachycardia (10/31 2000) Resp:  [21-28] 21 (11/01 0310) BP: (111-128)/(56-63) 111/56 mmHg (11/01 0310) SpO2:  [92 %-100 %] 100 % (11/01 0310) Weight:  [132 lb 0.9 oz (59.9 kg)] 132 lb 0.9 oz (59.9 kg) (11/01 0310)     Intake/Output from previous day: 10/31 0701 - 11/01 0700 In: 480 [P.O.:480] Out: 850 [Urine:750; Chest Tube:100]   Physical Exam:  Cardiovascular: RRR Pulmonary: Diminished on right and mostly clear on left Abdomen: Soft, non tender, bowel sounds present. Extremities: No lower extremity edema. Wounds: Clean and dry.  No erythema or signs of infection. Chest Tube: to water seal,  air leak with cough only  Lab Results: CBC:  Recent Labs  08/30/15 0504  WBC 13.6*  HGB 10.6*  HCT 32.0*  PLT 187   BMET:   Recent Labs  08/30/15 0504  NA 132*  K 4.2  CL 97*  CO2 25  GLUCOSE 130*  BUN 16  CREATININE 0.88  CALCIUM 8.7*    PT/INR: No results for input(s): LABPROT, INR in the last 72 hours. ABG:  INR: Will add last result for INR, ABG once components are confirmed Will add last 4 CBG results once components are confirmed  Assessment/Plan:  1. CV - SR/ slightly tachy in the 90's-low 100's 2.  Pulmonary - On 4 liters of oxygen via [place].Chest tube with 100 cc last 24 hours. Chest tube is to water seal. There is an air leak with cough, which he has had. CXR shows worsening airspace opacity (likely PNA), no pneumothorax.Mucinex for cough 3. Mild hyponatremia-sodium up to 132 4. Leukocytosis-WBC 13,600. Likely PNA. Will start Zosyn. Will ask pulmonary to evaluate.  ZIMMERMAN,DONIELLE MPA-C 08/30/2015,7:40  AM  started antibiotics this am for increasing consolidation and elevated white count  In patient on chemo therapy treatment  Asked pulmonary to assist in  care I have seen and examined Gabriel [name] and agree with the above assessment  and plan.  [name] Beeper [phone] Office [phone] 08/30/2015 5:32 PM

## 2015-08-30 NOTE — Consult Note (Signed)
Name: Gabriel [name] MRN: [id number] DOB: 1941-03-10    ADMISSION DATE:  08/23/2015 CONSULTATION DATE:  11/1   REFERRING MD :  Dr. Servando Snare   CHIEF COMPLAINT:  PNA   BRIEF PATIENT DESCRIPTION: 74 y/o M, current smoker, with PMH of adenocarcinoma of the lung and recurrent PTX who presented to [facility] on 10/25 with recurrent PTX.  CXR had evolving RLL airspace disease and PCCM consulted for evaluation.   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  At baseline, Gabriel [name] lives alone. He is a retired Civil engineer, contracting. He has smoked since age 69 up to 2 packs per day at his heaviest. He currently smokes approximately 5 cigarettes per day. His daughter and son live nearby and help with his care.  Gabriel [name] has a complicated history in regards to his right pneumothorax. He previously was admitted in June 2016 for right pneumothorax required chest tube placement. The chest tube was ultimately removed. During that hospitalization, a CT scan on 04/06/2015 showed a 4.3cmX4.7cm mass in the right hilar with associated right lower lobe atelectatics. Other pulmonary nodules were observed, including a 1.3X1.6cm left lower lobe nodule. A PET scan was performed on 04/12/2015 which showed a right hilar mass concerning for primary bronchogenic neoplasm. On 6/20 he underwent a bronchoscopy with right middle lobe biopsy which demonstrated non-small cell carcinoma consistent with adenocarcinoma.  He completed approximately 6 weeks of  chemotherapy and radiation.  In the interim, he underwent a left inguinal hernia repair on 9/16 per Dr. Hassell Done.  He was seen by Dr. Earlie Server on 9/26 with a plan for him to begin carboplatin and gemcitabine on 10/10.  He returned to the ER on 9/29 with recurrent complaints of shortness of breath and severe dyspnea on exertion.  He was found to have a recurrent right pneumothorax that required chest tube placement. On 10/4 he underwent a video bronchoscopy with an attempted insertion of endobronchial valve and second chest tube placement on the right.  He had a prolonged air leak and was ultimately was discharged home with chest tube in place.  He followed up with Dr. Servando Snare in the office on 10/25 - at that time he complained of worsening of dyspnea and hypoxemia. Chest x-ray evaluation at that time demonstrated an increased pneumothorax.  The patient was directly admitted to [facility] for further evaluation.  The patient reported fatigue, weakness and was noted to have fevers.  CXR demonstrated a worsening of R sided airspace disease.  There were tentative plans for talc pleurodesis on 11/1. However this was placed on hold due to worsening of right sided airspace disease. PCCM consulted 11/1 for evaluation HCAP.    PAST MEDICAL HISTORY :   has a past medical history of Spontaneous pneumothorax (MANY YRS AGO AND AGAIN 04/04/15); Cancer ([place]); Lung mass; Inguinal hernia; Productive cough; History of nonmelanoma skin cancer; Constipation; Difficulty sleeping; Radiation (05/11/15-06/21/15); and Pneumothorax on right (08/24/2015).  has past surgical history that includes Hernia repair (  2011); Colonoscopy w/ biopsies and polypectomy; Wisdom tooth extraction; Tonsillectomy; Video bronchoscopy with endobronchial ultrasound (N/A, 04/18/2015); Inguinal hernia repair (Left, 07/15/2015); Video bronchoscopy with insertion of interbronchial valve (ibv) (N/A, 08/02/2015); Chest tube insertion (Right, 08/02/2015); and Chest tube  insertion (07/28/2015).   Prior to Admission medications   Medication Sig Start Date End Date Taking? Authorizing Provider  acetaminophen (TYLENOL) 325 MG tablet Take 2 tablets (650 mg total) by mouth every 6 (six) hours as needed for mild pain, moderate pain, fever or headache (or Fever >/= 101). 08/18/15  Yes Modena Jansky, MD  albuterol (PROVENTIL HFA;VENTOLIN HFA) 108 (90 BASE) MCG/ACT inhaler Inhale 1-2 puffs into the lungs every 6 (six) hours as needed for wheezing or shortness of breath. 05/18/15  Yes Wandra Arthurs, MD  ALPRAZolam Duanne Moron) 0.5 MG tablet Take 1 tablet (0.5 mg total) by mouth daily as needed for anxiety. 08/18/15  Yes Modena Jansky, MD  Ascorbic Acid (VITAMIN C) 1000 MG tablet Take 1,000 mg by mouth daily.   Yes Historical Provider, MD  b complex vitamins tablet Take 1 tablet by mouth daily.   Yes Historical Provider, MD  budesonide-formoterol (SYMBICORT) 160-4.5 MCG/ACT inhaler Inhale 2 puffs into the lungs 2 (two) times daily. 08/18/15  Yes Modena Jansky, MD  CALCIUM CITRATE PO Take 1 tablet by mouth daily.   Yes Historical Provider, MD  Cyanocobalamin (VITAMIN B 12 PO) Take 1 tablet by mouth daily.   Yes Historical Provider, MD  docusate sodium (COLACE) 100 MG capsule Take 100 mg by mouth 3 (three) times daily as needed for mild constipation.    Yes Historical Provider, MD  L-Arginine 1000 MG TABS Take 1,000 mg by mouth daily.   Yes Historical Provider, MD  magnesium 30 MG tablet Take 30 mg by mouth daily.   Yes Historical Provider, MD  Multiple Vitamin (MULTI VITAMIN DAILY PO) Take 1 tablet by mouth daily.   Yes Historical Provider, MD  Omega-3 Fatty Acids (FISH OIL) 1000 MG CAPS Take 1,000 mg by mouth daily.   Yes Historical Provider, MD  oxyCODONE (OXY IR/ROXICODONE) 5 MG immediate release tablet Take 1 tablet (5 mg total) by mouth every 8 (eight) hours as needed for severe pain. 08/18/15  Yes Modena Jansky, MD  Pseudoephedrine-Guaifenesin (MUCINEX D) [phone] MG  TB12 Take 1 tablet by mouth 2 (two) times daily.   Yes Historical Provider, MD  vitamin A 10000 UNIT capsule Take 10,000 Units by mouth daily.   Yes Historical Provider, MD   Allergies  Allergen Reactions  . Aleve [Naproxen] Rash    FAMILY HISTORY:  family history includes Heart attack in his father; Heart disease in his father; Hypertension in his mother.   SOCIAL HISTORY:  reports that he has been smoking Cigarettes.  He has a 27.5 pack-year smoking history. He has never used smokeless tobacco. He reports that he drinks alcohol. He reports that he does not use illicit drugs.  REVIEW OF SYSTEMS:   Constitutional: Negative for and diaphoresis. Reports fever, chills, weight loss, malaise/fatigue HENT: Negative for hearing loss, ear pain, nosebleeds, congestion, sore throat, neck pain, tinnitus and ear discharge.   Eyes: Negative for blurred vision, double vision, photophobia, pain, discharge and redness.  Respiratory: Negative for hemoptysis, wheezing and stridor.  Reports cough with yellow sputum production, pleuritic chest pain & shortness of breath Cardiovascular: Negative for chest pain, palpitations, orthopnea, claudication, leg swelling and PND.  Gastrointestinal: Negative for heartburn, nausea, vomiting, abdominal pain, diarrhea, constipation, blood in  stool and melena.  Genitourinary: Negative for dysuria, urgency, frequency, hematuria and flank pain.  Musculoskeletal: Negative for myalgias, back pain, joint pain and falls.  Skin: Negative for itching and rash.  Neurological: Negative for dizziness, tingling, tremors, sensory change, speech change, focal weakness, seizures, loss of consciousness, weakness and headaches.  Endo/Heme/Allergies: Negative for environmental allergies and polydipsia. Does not bruise/bleed easily.  SUBJECTIVE:   VITAL SIGNS: Temp:  [98.7 F (37.1 C)-98.8 F (37.1 C)] 98.8 F (37.1 C) (11/01 0310) Pulse Rate:  [96-104] 102 (11/01 0310) Resp:  [21-28]  21 (11/01 0310) BP: (111-128)/(56-63) 111/56 mmHg (11/01 0310) SpO2:  [92 %-100 %] 100 % (11/01 0310) Weight:  [132 lb 0.9 oz (59.9 kg)] 132 lb 0.9 oz (59.9 kg) (11/01 0310)  PHYSICAL EXAMINATION: General:  Chronically ill appearing male in NAD Neuro:  AAOx4, speech clear, MAE  HEENT:  No jvd, MM pink/dry Cardiovascular:  s1s2 rrr, no m/r/g, R CT with 1/7 air leak with cough, otherwise no leak  Lungs: prolonged exp phase, on 4L, bronchial breath sounds on R, crackles RLL, L essentially clear   Abdomen:  Soft, NTND, bsx4 active  Musculoskeletal:  No acute deformities  Skin:  Warm/dry, no edema, rashes or lesions   Recent Labs Lab 08/25/15 0234 08/27/15 0332 08/30/15 0504  NA 130* 129* 132*  K 4.3 4.1 4.2  CL 99* 94* 97*  CO2 '24 26 25  '$ BUN 17 27* 16  CREATININE 0.99 1.15 0.88  GLUCOSE 146* 127* 130*    Recent Labs Lab 08/23/15 1852 08/25/15 0234 08/30/15 0504  HGB 14.8 12.3* 10.6*  HCT 42.9 35.9* 32.0*  WBC 16.3* 12.4* 13.6*  PLT 219 170 187   Dg Chest Port 1 View  08/30/2015  CLINICAL DATA:  Status post right-sided lung surgery, chest tube in place, chest discomfort. EXAM: PORTABLE CHEST 1 VIEW COMPARISON:  Portable chest x-ray of August 29, 2015 FINDINGS: On the right there is persistent volume loss and there has been overall increase in the conspicuity of the pulmonary interstitium. The right hemidiaphragm remains obscured. The right chest tube tip projects just above the posterior medial aspect of the right sixth rib. There is no pneumothorax. There is a small right pleural effusion. The left lung is clear. The pulmonary vascularity where visualized on the left is normal. The left heart border is normal. IMPRESSION: Worsening of interstitial and airspace opacity on the right consistent with pneumonia or asymmetric pulmonary edema. There is no pneumothorax. The left lung is clear. Electronically Signed   By: David  Martinique M.D.   On: 08/30/2015 07:33   Dg Chest Port 1  View  08/29/2015  CLINICAL DATA:  Right-sided pneumothorax, chest tube treatment. EXAM: PORTABLE CHEST 1 VIEW COMPARISON:  Portable chest x-ray of October 30th 2016 FINDINGS: There remains volume loss on the right. No pneumothorax is evident. There is persistent interstitial density in the mid and lower right lung. A small right pleural effusion is suspected. The right-sided chest tube tip projects over the medial aspect of the posterior right fifth rib. The left lung is clear. Old left posterior rib fractures are present. The heart is normal where visualized. The pulmonary vascularity is not engorged. IMPRESSION: No right-sided pneumothorax is evident. The right-sided chest tube is stable in position. There is persistent infiltrate or atelectasis in the lower third of the right lung. Electronically Signed   By: David  Martinique M.D.   On: 08/29/2015 07:36    ASSESSMENT / PLAN:   RLL Airspace Disease /  Concern for HCAP - Developing RLL airspace disease, concern for PNA and effusion.  Patient has had multiple instrumentation to R pleural space as well.  CT in place.    Plan: Expand abx to vanco / zosyn  Intermittent CXR Sputum culture  Mucinex  Pulmonary hygiene: IS, mobilize as able  Non-contrasted CT of chest pending per CVTS  Acute on Chronic Hypoxic Respiratory Failure - in the setting of RLL airspace disease, atx with splinting, effusion and underlying COPD   Plan: Will need ambulatory desaturation assessment prior to discharge O2 as needed to support sats 88-95% Control pain to allow for coughing / pulmonary hygiene    Right Pneumothorax - s/p chest tube placement, attempt at endobronchial valve placement   Plan: CT per CVTS Water seal    Presumed COPD Tobacco Abuse    Plan: Smoking cessation counseling  Continue symbicort PRN albuterol    Pain - chest tube and malignancy associated  Plan: Discussed ultram as first line, then oxy for moderate and dilaudid for severe  pain with patient    Adenocarcinoma of the R Lung   Plan: Per ONC   At Risk Protein Calorie Malnutrition - in the setting of malignancy   Plan: Diet education discussed with patient   Nutrition consult   Noe Gens, NP-C [facility] Pgr: [phone] or if no answer [phone] 08/30/2015, 8:47 AM

## 2015-08-30 NOTE — Progress Notes (Addendum)
ANTIBIOTIC CONSULT NOTE - INITIAL  Pharmacy Consult for vancomycin  Indication: rule out pneumonia  Allergies  Allergen Reactions  . Aleve [Naproxen] Rash    Patient Measurements: Height: '5\' 10"'$  (177.8 cm) Weight: 132 lb 0.9 oz (59.9 kg) IBW/kg (Calculated) : 73 Vital Signs: Temp: 98.8 F (37.1 C) (11/01 0310) Temp Source: Oral (11/01 0310) BP: 111/56 mmHg (11/01 0310) Pulse Rate: 100 (11/01 1033) Intake/Output from previous day: 10/31 0701 - 11/01 0700 In: 480 [P.O.:480] Out: 850 [Urine:750; Chest Tube:100] Intake/Output from this shift:    Labs:  Recent Labs  08/30/15 0504  WBC 13.6*  HGB 10.6*  PLT 187  CREATININE 0.88   Estimated Creatinine Clearance: 61.5 mL/min (by C-G formula based on Cr of 0.88). No results for input(s): VANCOTROUGH, VANCOPEAK, VANCORANDOM, GENTTROUGH, GENTPEAK, GENTRANDOM, TOBRATROUGH, TOBRAPEAK, TOBRARND, AMIKACINPEAK, AMIKACINTROU, AMIKACIN in the last 72 hours.   Microbiology: Recent Results (from the past 720 hour(s))  Surgical pcr screen     Status: None   Collection Time: 08/01/15 11:47 PM  Result Value Ref Range Status   MRSA, PCR NEGATIVE NEGATIVE Final   Staphylococcus aureus NEGATIVE NEGATIVE Final    Comment:        The Xpert SA Assay (FDA approved for NASAL specimens in patients over 13 years of age), is one component of a comprehensive surveillance program.  Test performance has been validated by [facility] for patients greater than or equal to 21 year old. It is not intended to diagnose infection nor to guide or monitor treatment.   Culture, respiratory (NON-Expectorated)     Status: None   Collection Time: 08/02/15  8:26 AM  Result Value Ref Range Status   Specimen Description BRONCHIAL WASHINGS RIGHT  Final   Special Requests NONE  Final   Gram Stain   Final    MODERATE WBC PRESENT, PREDOMINANTLY PMN RARE SQUAMOUS EPITHELIAL CELLS PRESENT FEW GRAM POSITIVE COCCI IN PAIRS FEW GRAM NEGATIVE  COCCOBACILLI Performed at [facility]    Culture   Final    Non-Pathogenic Oropharyngeal-type Flora Isolated. Performed at [facility]    Report Status 08/05/2015 FINAL  Final  Gram stain     Status: None   Collection Time: 08/02/15  8:26 AM  Result Value Ref Range Status   Specimen Description BRONCHIAL WASHINGS  Final   Special Requests NONE  Final   Gram Stain   Final    MODERATE WBC PRESENT, PREDOMINANTLY PMN FEW GRAM POSITIVE COCCI IN PAIRS FEW GRAM NEGATIVE COCCOBACILLI RARE GRAM POSITIVE RODS    Report Status 08/02/2015 FINAL  Final  Culture, blood (routine x 2) Call MD if unable to obtain prior to antibiotics being given     Status: None   Collection Time: 08/04/15  4:00 PM  Result Value Ref Range Status   Specimen Description BLOOD RIGHT ANTECUBITAL  Final   Special Requests BOTTLES DRAWN AEROBIC ONLY 10CC  Final   Culture NO GROWTH 5 DAYS  Final   Report Status 08/09/2015 FINAL  Final  Culture, blood (routine x 2) Call MD if unable to obtain prior to antibiotics being given     Status: None   Collection Time: 08/04/15  4:18 PM  Result Value Ref Range Status   Specimen Description BLOOD LEFT ANTECUBITAL  Final   Special Requests BOTTLES DRAWN AEROBIC AND ANAEROBIC 10CC  Final   Culture NO GROWTH 5 DAYS  Final   Report Status 08/09/2015 FINAL  Final  Culture, expectorated sputum-assessment  Status: None   Collection Time: 08/30/15  9:46 AM  Result Value Ref Range Status   Specimen Description EXPECTORATED SPUTUM  Final   Special Requests Immunocompromised  Final   Sputum evaluation   Final    THIS SPECIMEN IS ACCEPTABLE. RESPIRATORY CULTURE REPORT TO FOLLOW.   Report Status 08/30/2015 FINAL  Final    Medical History: Past Medical History  Diagnosis Date  . Spontaneous pneumothorax MANY YRS AGO AND AGAIN 04/04/15    HISTORY OF (ONLY)  . Cancer ([place])     skin cancer  . Lung mass     right / HAS HAD RADIATION AND CHEMO FOR LUNG CANCER  .  Inguinal hernia   . Productive cough     "DUE TO RADIATION"  . History of nonmelanoma skin cancer   . Constipation     AFTER ANESTHESIA  . Difficulty sleeping   . Radiation 05/11/15-06/21/15    NSCLCA/right lung  . Pneumothorax on right 08/24/2015    Medications:  Anti-infectives    Start     Dose/Rate Route Frequency Ordered Stop   08/30/15 0900  piperacillin-tazobactam (ZOSYN) IVPB 3.375 g     3.375 g 12.5 mL/hr over 240 Minutes Intravenous Every 8 hours 08/30/15 0757       Assessment: 74 year old male with concern for pneumonia on chest xray to start vancomycin per pharmacy dosing and Zosyn per MD. Note history of lung cancer and recurrent R-pneumothorax.   WBC 13.6- Immunocompromised. Tmax 100.4.  Scr 0.88, UOP 0.5 cc/kg/hr.   Cultures: 11/1 Respiratory culture >>  Goal of Therapy:  Vancomycin trough level 15-20 mcg/ml  Plan:  Start vancomycin 750 mg IV every 12 hours.   Sloan Leiter, PharmD, BCPS Clinical Pharmacist [phone] 08/30/2015,11:22 AM

## 2015-08-31 ENCOUNTER — Inpatient Hospital Stay (HOSPITAL_COMMUNITY): Payer: 59

## 2015-08-31 DIAGNOSIS — R079 Chest pain, unspecified: Secondary | ICD-10-CM

## 2015-08-31 DIAGNOSIS — J189 Pneumonia, unspecified organism: Secondary | ICD-10-CM | POA: Insufficient documentation

## 2015-08-31 LAB — CBC
HCT: 30.1 % — ABNORMAL LOW (ref 39.0–52.0)
HEMOGLOBIN: 9.9 g/dL — AB (ref 13.0–17.0)
MCH: 29.6 pg (ref 26.0–34.0)
MCHC: 32.9 g/dL (ref 30.0–36.0)
MCV: 90.1 fL (ref 78.0–100.0)
Platelets: 213 10*3/uL (ref 150–400)
RBC: 3.34 MIL/uL — ABNORMAL LOW (ref 4.22–5.81)
RDW: 15 % (ref 11.5–15.5)
WBC: 15.8 10*3/uL — ABNORMAL HIGH (ref 4.0–10.5)

## 2015-08-31 LAB — BASIC METABOLIC PANEL
Anion gap: 11 (ref 5–15)
BUN: 19 mg/dL (ref 6–20)
CHLORIDE: 96 mmol/L — AB (ref 101–111)
CO2: 26 mmol/L (ref 22–32)
CREATININE: 0.83 mg/dL (ref 0.61–1.24)
Calcium: 8.8 mg/dL — ABNORMAL LOW (ref 8.9–10.3)
GFR calc Af Amer: >60 mL/min (ref >60)
GFR calc non Af Amer: >60 mL/min (ref >60)
GLUCOSE: 122 mg/dL — AB (ref 65–99)
Potassium: 3.8 mmol/L (ref 3.5–5.1)
SODIUM: 133 mmol/L — AB (ref 135–145)

## 2015-08-31 LAB — PROCALCITONIN: PROCALCITONIN: 3.44 ng/mL

## 2015-08-31 MED ORDER — SACCHAROMYCES BOULARDII 250 MG PO CAPS
250.0000 mg | ORAL_CAPSULE | Freq: Two times a day (BID) | ORAL | Status: DC
  Administered 2015-08-31 – 2015-09-14 (×29): 250 mg via ORAL
  Filled 2015-08-31 (×29): qty 1

## 2015-08-31 NOTE — Progress Notes (Addendum)
      [address]       [place] [address]             [phone]          Subjective: Patient states that he had an episode of intense right sided chest pain after coughing last night. Oxy did help.  Objective: Vital signs in last 24 hours: Temp:  [98.2 F (36.8 C)-98.4 F (36.9 C)] 98.4 F (36.9 C) (11/02 0519) Pulse Rate:  [87-100] 87 (11/02 0519) Cardiac Rhythm:  [-] Normal sinus rhythm (11/01 1940) Resp:  [18-20] 20 (11/02 0519) BP: (88-108)/(53-69) 102/56 mmHg (11/02 0546) SpO2:  [91 %-100 %] 95 % (11/02 0519) Weight:  [131 lb 14.4 oz (59.829 kg)] 131 lb 14.4 oz (59.829 kg) (11/02 0519)     Intake/Output from previous day: 11/01 0701 - 11/02 0700 In: 120 [P.O.:120] Out: -    Physical Exam:  Cardiovascular: RRR Pulmonary: Coarse on right and mostly clear on left Abdomen: Soft, non tender, bowel sounds present. Extremities: No lower extremity edema. Wounds: Clean and dry.  No erythema or signs of infection. Chest Tube: to water seal,  air leak with cough only  Lab Results: CBC:  Recent Labs  08/30/15 0504 08/31/15 0440  WBC 13.6* 15.8*  HGB 10.6* 9.9*  HCT 32.0* 30.1*  PLT 187 213   BMET:   Recent Labs  08/30/15 0504 08/31/15 0440  NA 132* 133*  K 4.2 3.8  CL 97* 96*  CO2 25 26  GLUCOSE 130* 122*  BUN 16 19  CREATININE 0.88 0.83  CALCIUM 8.7* 8.8*    PT/INR: No results for input(s): LABPROT, INR in the last 72 hours. ABG:  INR: Will add last result for INR, ABG once components are confirmed Will add last 4 CBG results once components are confirmed  Assessment/Plan:  1. CV - SR/ slightly tachy in the 90's-low 100's 2.  Pulmonary - On 6 liters of oxygen via [place].Chest tube with 100 cc last 24 hours. Chest tube is to water seal. There is an air leak with cough, which he has had. CXR shows trace right apical pneumothorax, right lung base infiltrate, stable cardiomegaly. CT chest done yesterday showed multilobular  pneumonia, small right apical pneumothorax, stable pulmonary nodules on left. Mucinex for cough 3. Mild hyponatremia-sodium up to 133 4. Anemia-H and H 9.9 and 30.1 5. Leukocytosis-WBC up to 15,800. Likely HCAP. Await sputum culture results.On Zosyn and Vanco. Pulmonary/CCM following.  ZIMMERMAN,DONIELLE MPA-C 08/31/2015,7:35 AM   Wbc up , feels little better then yesterday  I have seen and examined [name] and agree with the above assessment  and plan.  [name] Beeper [phone] Office [phone] 08/31/2015 6:47 PM

## 2015-08-31 NOTE — Progress Notes (Signed)
Utilization review completed.  

## 2015-08-31 NOTE — Progress Notes (Signed)
Initial Nutrition Assessment  DOCUMENTATION CODES:   Severe malnutrition in context of chronic illness  INTERVENTION:  Ensure Enlive po BID, each supplement provides 350 kcal and 20 grams of protein  NUTRITION DIAGNOSIS:   Malnutrition related to cancer and cancer related treatments as evidenced by moderate depletion of body fat, severe depletion of muscle mass.  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 He was last seen by Dr. Julien Nordmann on 07/25/2015 with treatment options were discussed at the time, he expects to start treatment with carboplatin and gemcitabine on 08/08/2015. Mr. Osborn also has a history of a right-sided pneumothorax diagnosed on 04/04/2015   Spoke with pt. Reports good appetite at home. PO intake avg 87.5 until onset of pneumonia. Reports appetite has been poor since then.  Pt drinks carnation instant breakfast mixed with [employer] protein powder at home. Ensure currently ordered, found 3 unopened in his room. Pt stated he will try to start drinking them again soon as he feels better.  Reports no issues with shortness of breath, or barriers to nutrition at home.  Nutrition-Focused physical exam completed. Findings are moderate-severe fat depletion, severe muscle depletion, and no edema.  Pt exhibits 8#/6% insignificant wt loss in 3 months, likely related to cancer/chronic illness,  Monitor for PO intake.  Diet Order:  Diet regular Room service appropriate?: Yes; Fluid consistency:: Thin  Skin:  Wound (see comment) (Sutures to chest.)  Last BM:   08/29/2015  Height:   Ht Readings from Last 1 Encounters:  08/23/15 '5\' 10"'$  (1.778 m)    Weight:   Wt Readings from Last 1 Encounters:  08/31/15 131 lb 14.4 oz (59.829 kg)    Ideal Body Weight:  75.45 kg  BMI:  Body mass index is 18.93 kg/(m^2).  Estimated Nutritional Needs:   Kcal:  2100-2400 calories  Protein:  76-100 grams  Fluid:  >/= 2.1L  EDUCATION NEEDS:   No education needs identified at this time  Satira Anis. Lavarr President, MS, RD LDN After Hours/Weekend Pager [phone]

## 2015-08-31 NOTE — Progress Notes (Signed)
Name: Gabriel [name] MRN: [id number] DOB: Oct 18, 1941    ADMISSION DATE:  08/23/2015 CONSULTATION DATE:  11/1   REFERRING MD :  Dr. Servando Snare   CHIEF COMPLAINT:  PNA   BRIEF PATIENT DESCRIPTION: 74 y/o M, current smoker, with PMH of adenocarcinoma of the lung and recurrent PTX who [facility] with recurrent PTX.  CXR had evolving RLL airspace disease and PCCM consulted for evaluation.   SIGNIFICANT EVENTS  10/25  Admit for recurrent PTX despite chest tube  11/01  PCCM consulted for HCAP   STUDIES:  11/01  CT Chest w/o >> dense consolidation within the RML, RLL, RUL c/w multilobar PNA, R chest tube with small apical ptx, small R effusion with interspersed gas, stable pulmonary nodules in L lung     SUBJECTIVE:  Pt reports right chest pain overnight with coughing episode.  Pain relieved with oxycodone.  O2 increased to 5L/[place]   VITAL SIGNS: Temp:  [98.2 F (36.8 C)-98.4 F (36.9 C)] 98.4 F (36.9 C) (11/02 0519) Pulse Rate:  [87-100] 87 (11/02 0519) Resp:  [18-20] 20 (11/02 0519) BP: (88-108)/(53-69) 102/56 mmHg (11/02 0546) SpO2:  [91 %-100 %] 95 % (11/02 0838) Weight:  [131 lb 14.4 oz (59.829 kg)] 131 lb 14.4 oz (59.829 kg) (11/02 0519)  PHYSICAL EXAMINATION: General:  Chronically ill appearing male in NAD Neuro:  AAOx4, speech clear, MAE  HEENT:  No jvd, MM pink/dry Cardiovascular:  s1s2 rrr, no m/r/g, R CT with 1/7 air leak with cough, otherwise no leak  Lungs: prolonged exp phase, on 5L, bronchial breath sounds on R, crackles RLL, L with crackles lower lobe Abdomen:  Soft, NTND, bsx4 active  Musculoskeletal:  No acute deformities  Skin:  Warm/dry, no edema, rashes or lesions   Recent Labs Lab 08/27/15 0332 08/30/15 0504 08/31/15 0440  NA 129* 132* 133*  K 4.1 4.2 3.8  CL 94* 97* 96*  CO2 '26 25 26  '$ BUN 27* 16 19  CREATININE 1.15 0.88 0.83  GLUCOSE 127* 130* 122*    Recent Labs Lab 08/25/15 0234 08/30/15 0504 08/31/15 0440  HGB 12.3*  10.6* 9.9*  HCT 35.9* 32.0* 30.1*  WBC 12.4* 13.6* 15.8*  PLT 170 187 213   Ct Chest Wo Contrast  08/30/2015  CLINICAL DATA:  Respiratory failure, chest tube placement EXAM: CT CHEST WITHOUT CONTRAST TECHNIQUE: Multidetector CT imaging of the chest was performed following the standard protocol without IV contrast. COMPARISON:  Chest radiograph 08/30/2015, 08/29/2015, CT 07/22/2015 FINDINGS: Mediastinum/Nodes: No axillary or supraclavicular lymphadenopathy. No mediastinal hilar adenopathy. No pericardial effusion. Esophagus normal. Trachea is large measuring 2.5 cm in diameter above the carina Lungs/Pleura: Chest tube extends anteriorly to the RIGHT lung apex. There is bullous change at the apex of the small RIGHT apical pneumothorax. There is dense consolidation within the RIGHT middle lobe and RIGHT lower lobe are to lesser degree the RIGHT upper lobe. There is air bronchograms present. Much of the consolidation is dense without air bronchograms. Underlying centrilobular emphysema. There is a small RIGHT effusion with a small locules of gas interspersed. Overall there is volume loss the RIGHT hemi thorax. The LEFT lung is expanded. There is centrilobular emphysema. Thin-walled cavitary lesion in the superior segment of the LEFT lower lobe measuring 2 .2 cm on (image 33, series 405). Small spiculated nodule in the RIGHT upper lobe LEFT upper lobe measures 8 mm on image 19. Most inferior LEFT lower lobe irregular nodule or diaphragm measures 12 mm on image  54. CT comparison from 07/22/2015 demonstrates the short-term stability of the LEFT lung lesions these dense RIGHT consolidation is new. Upper abdomen: Limited view of the liver, kidneys, pancreas are unremarkable. Normal adrenal glands. Musculoskeletal: No aggressive osseous lesion. IMPRESSION: 1. Dense consolidation within the RIGHT middle and lower lobe and to a lesser degree the RIGHT upper lobe most consistent with MULTILOBAR PNEUMONIA. 2. RIGHT chest  tube in place with small apical pneumothorax. Small RIGHT effusion with interspersed gas. 3. Stable pulmonary nodules in the LEFT lung compared to CT 07/22/2015. Recommend followup. Electronically Signed   By: Suzy Bouchard M.D.   On: 08/30/2015 13:55   Dg Chest Port 1 View  08/31/2015  CLINICAL DATA:  Pneumonia.  Right chest tube. EXAM: PORTABLE CHEST 1 VIEW COMPARISON:  CT 08/30/2015.  Chest x-ray 08/30/2015. FINDINGS: Right chest tube in stable position. Stable tiny apical pneumothorax. Persistent right lung infiltrate particular prominent right lung base. Left lung tiny nodules best seen by prior CT. Stable cardiomegaly with normal pulmonary vascularity. Old left rib fractures. IMPRESSION: 1. Right chest tube in stable position. Tiny stable right apical pneumothorax. 2. Persistent dense right lung infiltrate particular prominent right lung base. 3. Left lung tiny pulmonary nodules best identified by CT. 4. Stable cardiomegaly. Electronically Signed   By: Marcello Moores  Register   On: 08/31/2015 07:16   Dg Chest Port 1 View  08/30/2015  CLINICAL DATA:  Status post right-sided lung surgery, chest tube in place, chest discomfort. EXAM: PORTABLE CHEST 1 VIEW COMPARISON:  Portable chest x-ray of August 29, 2015 FINDINGS: On the right there is persistent volume loss and there has been overall increase in the conspicuity of the pulmonary interstitium. The right hemidiaphragm remains obscured. The right chest tube tip projects just above the posterior medial aspect of the right sixth rib. There is no pneumothorax. There is a small right pleural effusion. The left lung is clear. The pulmonary vascularity where visualized on the left is normal. The left heart border is normal. IMPRESSION: Worsening of interstitial and airspace opacity on the right consistent with pneumonia or asymmetric pulmonary edema. There is no pneumothorax. The left lung is clear. Electronically Signed   By: David  Martinique M.D.   On: 08/30/2015  07:33    ASSESSMENT / PLAN:   RLL Airspace Disease / Concern for HCAP - Developing RLL airspace disease, concern for PNA and effusion.  Patient has had multiple instrumentation to R pleural space as well.  CT in place.  CT of the chest with dense consolidation on the R.    Plan: Vanco 11/1 >> Zosyn 11/1 >> Intermittent CXR Sputum culture 11/1 >> Mucinex  Pulmonary hygiene: IS, mobilize as able  Add flutter valve    Acute on Chronic Hypoxic Respiratory Failure - in the setting of RLL airspace disease, atx with splinting, effusion and underlying COPD   Plan: Will need ambulatory desaturation assessment prior to discharge O2 as needed to support sats 88-95% Control pain to allow for coughing / pulmonary hygiene    Right Pneumothorax - s/p chest tube placement, attempt at endobronchial valve placement   Plan: CT per CVTS Water seal  Will eventually need pleurodesis    Presumed COPD Tobacco Abuse    Plan: Smoking cessation counseling  Continue symbicort PRN albuterol    Pain - chest tube and malignancy associated  Plan: Discussed ultram as first line, then oxy for moderate and dilaudid for severe pain with patient    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

## 2015-08-31 NOTE — Progress Notes (Signed)
2140 Called to patient room by son.Patient son helping him to wash up and chest tube tubing became dislodged.Son called for help but before doing so he reconnected chest tube tubing.Patient denies any increase of shortness of breath oxygen saturations 95% on 5 liters nasal canula.Lungs sounds diminished .Chest tube leak noticed with cough up to 7 previous 2-3 .Vital signs Temp. 98.6 HR  108 RR 22 B/P 118/59 .Chest tube dressing changed was soiled with old drainage.Vaseline gauze applied around insertion site as previously noted.Dr.Van Tright notified .Order received for stat portable chest xray.Will continue to monitor patient.

## 2015-09-01 ENCOUNTER — Inpatient Hospital Stay (HOSPITAL_COMMUNITY): Payer: 59

## 2015-09-01 DIAGNOSIS — J939 Pneumothorax, unspecified: Secondary | ICD-10-CM

## 2015-09-01 LAB — CBC
HEMATOCRIT: 30.9 % — AB (ref 39.0–52.0)
HEMOGLOBIN: 10.3 g/dL — AB (ref 13.0–17.0)
MCH: 29.7 pg (ref 26.0–34.0)
MCHC: 33.3 g/dL (ref 30.0–36.0)
MCV: 89 fL (ref 78.0–100.0)
Platelets: 209 10*3/uL (ref 150–400)
RBC: 3.47 MIL/uL — ABNORMAL LOW (ref 4.22–5.81)
RDW: 14.9 % (ref 11.5–15.5)
WBC: 23.7 10*3/uL — ABNORMAL HIGH (ref 4.0–10.5)

## 2015-09-01 LAB — PROCALCITONIN: PROCALCITONIN: 3.97 ng/mL

## 2015-09-01 NOTE — Progress Notes (Signed)
Name: Gabriel [name] MRN: [id number] DOB: 1941/01/15    ADMISSION DATE:  08/23/2015 CONSULTATION DATE:  11/1   REFERRING MD :  Dr. Servando Snare   CHIEF COMPLAINT:  PNA   BRIEF PATIENT DESCRIPTION: 74 y/o M, current smoker, with PMH of adenocarcinoma of the lung and recurrent PTX who presented to [facility] on 10/25 with recurrent PTX.  CXR had evolving RLL airspace disease and PCCM consulted for evaluation.   SIGNIFICANT EVENTS  10/25  Admit for recurrent PTX despite chest tube  11/01  PCCM consulted for HCAP     11/01  CT Chest w/o >> dense consolidation within the RML, RLL, RUL c/w multilobar PNA, R chest tube with small apical ptx, small R effusion with interspersed gas, stable pulmonary nodules in L lung  08/31/15:   Pt reports right chest pain overnight with coughing episode.  Pain relieved with oxycodone.  O2 increased to 5L/[place]    SUBJECTIVE/OVERNIGHT/INTERVAL HX 09/01/15 - feels some better. Eating food.    VITAL SIGNS: Temp:  [98.1 F (36.7 C)-98.9 F (37.2 C)] 98.4 F (36.9 C) (11/03 0457) Pulse Rate:  [94-111] 94 (11/03 0457) Resp:  [20-22] 20 (11/03 0457) BP: (96-118)/(57-63) 96/57 mmHg (11/03 0457) SpO2:  [92 %-95 %] 92 % (11/03 0457) Weight:  [60.2 kg (132 lb 11.5 oz)] 60.2 kg (132 lb 11.5 oz) (11/03 0457)  PHYSICAL EXAMINATION: General:  Chronically ill appearing male in NAD. Lookng better Neuro:  AAOx4, speech clear, MAE  HEENT:  No jvd, MM pink/dry Cardiovascular:  s1s2 rrr, no m/r/g, R CT with 1/7 air leak with cough, otherwise no leak  Lungs: prolonged exp phase, on 5L, bronchial breath sounds on R, crackles RLL, L with crackles lower lobe - IMPROVED Abdomen:  Soft, NTND, bsx4 active  Musculoskeletal:  No acute deformities  Skin:  Warm/dry, no edema, rashes or lesions   Recent Labs Lab 08/27/15 0332 08/30/15 0504 08/31/15 0440  NA 129* 132* 133*  K 4.1 4.2 3.8  CL 94* 97* 96*  CO2 '26 25 26  '$ BUN 27* 16 19  CREATININE 1.15 0.88 0.83  GLUCOSE 127* 130*  122*    Recent Labs Lab 08/30/15 0504 08/31/15 0440 09/01/15 0500  HGB 10.6* 9.9* 10.3*  HCT 32.0* 30.1* 30.9*  WBC 13.6* 15.8* 23.7*  PLT 187 213 209   Ct Chest Wo Contrast  08/30/2015  CLINICAL DATA:  Respiratory failure, chest tube placement EXAM: CT CHEST WITHOUT CONTRAST TECHNIQUE: Multidetector CT imaging of the chest was performed following the standard protocol without IV contrast. COMPARISON:  Chest radiograph 08/30/2015, 08/29/2015, CT 07/22/2015 FINDINGS: Mediastinum/Nodes: No axillary or supraclavicular lymphadenopathy. No mediastinal hilar adenopathy. No pericardial effusion. Esophagus normal. Trachea is large measuring 2.5 cm in diameter above the carina Lungs/Pleura: Chest tube extends anteriorly to the RIGHT lung apex. There is bullous change at the apex of the small RIGHT apical pneumothorax. There is dense consolidation within the RIGHT middle lobe and RIGHT lower lobe are to lesser degree the RIGHT upper lobe. There is air bronchograms present. Much of the consolidation is dense without air bronchograms. Underlying centrilobular emphysema. There is a small RIGHT effusion with a small locules of gas interspersed. Overall there is volume loss the RIGHT hemi thorax. The LEFT lung is expanded. There is centrilobular emphysema. Thin-walled cavitary lesion in the superior segment of the LEFT lower lobe measuring 2 .2 cm on (image 33, series 405). Small spiculated nodule in the RIGHT upper lobe LEFT upper lobe measures 8 mm on image  19. Most inferior LEFT lower lobe irregular nodule or diaphragm measures 12 mm on image 54. CT comparison from 07/22/2015 demonstrates the short-term stability of the LEFT lung lesions these dense RIGHT consolidation is new. Upper abdomen: Limited view of the liver, kidneys, pancreas are unremarkable. Normal adrenal glands. Musculoskeletal: No aggressive osseous lesion. IMPRESSION: 1. Dense consolidation within the RIGHT middle and lower lobe and to a lesser  degree the RIGHT upper lobe most consistent with MULTILOBAR PNEUMONIA. 2. RIGHT chest tube in place with small apical pneumothorax. Small RIGHT effusion with interspersed gas. 3. Stable pulmonary nodules in the LEFT lung compared to CT 07/22/2015. Recommend followup. Electronically Signed   By: Suzy Bouchard M.D.   On: 08/30/2015 13:55   Dg Chest Port 1 View  09/01/2015  CLINICAL DATA:  Pneumothorax.  Right chest tube. EXAM: PORTABLE CHEST 1 VIEW COMPARISON:  08/31/2015. FINDINGS: Right chest tube in stable position. Stable residual tiny right apical pneumothorax. Dense consolidation right lung unchanged. Right pleural effusion unchanged. Left lung is clear. Heart size is stable. Old left rib fractures. IMPRESSION: 1. Right chest tube in stable position tiny stable apical pneumothorax. 2. Dense consolidation right lung and right pleural effusion unchanged . Electronically Signed   By: Marcello Moores  Register   On: 09/01/2015 07:28   Dg Chest Port 1 View  08/31/2015  CLINICAL DATA:  Evaluate right-sided chest tube. Chest tube was accidentally disconnected from drainage and reconnected by family member. EXAM: PORTABLE CHEST 1 VIEW COMPARISON:  08/31/2015 FINDINGS: Right chest tube remains unchanged in position. Small residual right apical pneumothorax similar to previous study. Diffuse underlying lung disease on the right with areas of consolidation and emphysematous change. Left lung appears clear with compensatory hyperinflation. Normal heart size and pulmonary vascularity. Old left rib fractures. IMPRESSION: No change in appearance of right chest tube, minimal residual right apical pneumothorax, and diffuse infiltration and emphysematous changes in the right lung. Electronically Signed   By: Lucienne Capers M.D.   On: 08/31/2015 22:20   Dg Chest Port 1 View  08/31/2015  CLINICAL DATA:  Pneumonia.  Right chest tube. EXAM: PORTABLE CHEST 1 VIEW COMPARISON:  CT 08/30/2015.  Chest x-ray 08/30/2015. FINDINGS:  Right chest tube in stable position. Stable tiny apical pneumothorax. Persistent right lung infiltrate particular prominent right lung base. Left lung tiny nodules best seen by prior CT. Stable cardiomegaly with normal pulmonary vascularity. Old left rib fractures. IMPRESSION: 1. Right chest tube in stable position. Tiny stable right apical pneumothorax. 2. Persistent dense right lung infiltrate particular prominent right lung base. 3. Left lung tiny pulmonary nodules best identified by CT. 4. Stable cardiomegaly. Electronically Signed   By: Marcello Moores  Register   On: 08/31/2015 07:16    ASSESSMENT / PLAN:   RLL Airspace Disease / Concern for HCAP - Developing RLL airspace disease, concern for PNA and effusion.  Patient has had multiple instrumentation to R pleural space as well.  CT in place.  CT of the chest with dense consolidation on the R.  All c/w HCAP   Plan: Vanco 11/1 >> and Zosyn 11/1 >> - recommend PO from 09/02/15 for another 9 days Intermittent CXR Sputum culture 11/1 >> Mucinex  Pulmonary hygiene: IS, mobilize as able  Add flutter valve    Acute on Chronic Hypoxic Respiratory Failure - in the setting of RLL airspace disease, atx with splinting, effusion and underlying COPD   Plan: Will need ambulatory desaturation assessment prior to discharge O2 as needed to support sats 88-95% Control  pain to allow for coughing / pulmonary hygiene    Right Pneumothorax - s/p chest tube placement, attempt at endobronchial valve placement   Plan: CT per CVTS Water seal  Will eventually need pleurodesis but likely will have to wait till ifnection clears  Presumed COPD Tobacco Abuse    Plan: Smoking cessation counseling  Continue symbicort PRN albuterol  OPD fu Dr Chase Caller APp Tammy Parrett - thu 08/1715 2pm Will set up pulm rehab opd then  Pain - chest tube and malignancy associated  Plan: Discussed ultram as first line, then oxy for moderate and dilaudid for severe pain with  patient    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   Plan: Per ONC   At Risk Protein Calorie Malnutrition - in the setting of malignancy   Plan: Diet education discussed with patient   Nutrition consult    [place] will sign off  Future Appointments Date Time Provider [place]  09/05/2015 2:30 PM [facility] LAB 2 [facility] None  09/05/2015 3:00 PM [facility] B5 [facility] None  09/12/2015 3:45 PM [facility] LAB ONLY [facility] None  09/15/2015 2:00 PM Tammy S Parrett, NP [facility] None  09/19/2015 1:15 PM [facility] LAB 5 [facility] None  09/19/2015 1:45 PM Curt Bears, MD [facility] None  09/19/2015 2:15 PM [facility] B6 [facility] None  09/26/2015 2:30 PM [facility] LAB 4 [facility] None  09/26/2015 3:00 PM [facility] B5 [facility] None     Dr. Brand Males, M.D., [facility].C.P Pulmonary and Critical Care Medicine Staff Physician [place] [facility] Pager: [phone], If no answer or between  15:00h - 7:00h: call [phone]  09/01/2015 9:14 AM

## 2015-09-01 NOTE — Progress Notes (Addendum)
[address]       [place] [address]             [phone]          Subjective: Feels fair,  + productive sputum  Objective: Vital signs in last 24 hours: Temp:  [98.1 F (36.7 C)-98.9 F (37.2 C)] 98.4 F (36.9 C) (11/03 0457) Pulse Rate:  [94-111] 94 (11/03 0457) Cardiac Rhythm:  [-] Sinus tachycardia (11/02 2145) Resp:  [20-22] 20 (11/03 0457) BP: (96-118)/(57-63) 96/57 mmHg (11/03 0457) SpO2:  [92 %-95 %] 92 % (11/03 0457) Weight:  [132 lb 11.5 oz (60.2 kg)] 132 lb 11.5 oz (60.2 kg) (11/03 0457)  Hemodynamic parameters for last 24 hours:    Intake/Output from previous day: 11/02 0701 - 11/03 0700 In: 0  Out: 800 [Urine:800] Intake/Output this shift:    General appearance: alert, cooperative and no distress Heart: regular rate and rhythm Lungs: coarse BS on right Abdomen: benign Extremities: minor edema  Lab Results:  Recent Labs  08/31/15 0440 09/01/15 0500  WBC 15.8* 23.7*  HGB 9.9* 10.3*  HCT 30.1* 30.9*  PLT 213 209   BMET:  Recent Labs  08/30/15 0504 08/31/15 0440  NA 132* 133*  K 4.2 3.8  CL 97* 96*  CO2 25 26  GLUCOSE 130* 122*  BUN 16 19  CREATININE 0.88 0.83  CALCIUM 8.7* 8.8*    PT/INR: No results for input(s): LABPROT, INR in the last 72 hours. ABG    Component Value Date/Time   TCO2 22 04/04/2015 1839   CBG (last 3)  No results for input(s): GLUCAP in the last 72 hours.  Meds Scheduled Meds: . B-complex with vitamin C  1 tablet Oral Daily  . budesonide-formoterol  2 puff Inhalation BID  . docusate sodium  100 mg Oral BID  . enoxaparin (LOVENOX) injection  30 mg Subcutaneous QHS  . feeding supplement (ENSURE ENLIVE)  237 mL Oral BID BM  . guaiFENesin  1,200 mg Oral BID  . magic mouthwash  15 mL Oral TID  . piperacillin-tazobactam (ZOSYN)  IV  3.375 g Intravenous Q8H  . saccharomyces boulardii  250 mg Oral BID  . senna  1 tablet Oral BID  . sodium chloride  3 mL Intravenous Q12H  .  vancomycin  750 mg Intravenous Q12H  . vitamin C  1,000 mg Oral Daily   Continuous Infusions:  PRN Meds:.albuterol, ALPRAZolam, bisacodyl, HYDROmorphone (DILAUDID) injection, ondansetron **OR** ondansetron (ZOFRAN) IV, oxyCODONE, traMADol  Xrays Ct Chest Wo Contrast  08/30/2015  CLINICAL DATA:  Respiratory failure, chest tube placement EXAM: CT CHEST WITHOUT CONTRAST TECHNIQUE: Multidetector CT imaging of the chest was performed following the standard protocol without IV contrast. COMPARISON:  Chest radiograph 08/30/2015, 08/29/2015, CT 07/22/2015 FINDINGS: Mediastinum/Nodes: No axillary or supraclavicular lymphadenopathy. No mediastinal hilar adenopathy. No pericardial effusion. Esophagus normal. Trachea is large measuring 2.5 cm in diameter above the carina Lungs/Pleura: Chest tube extends anteriorly to the RIGHT lung apex. There is bullous change at the apex of the small RIGHT apical pneumothorax. There is dense consolidation within the RIGHT middle lobe and RIGHT lower lobe are to lesser degree the RIGHT upper lobe. There is air bronchograms present. Much of the consolidation is dense without air bronchograms. Underlying centrilobular emphysema. There is a small RIGHT effusion with a small locules of gas interspersed. Overall there is volume loss the RIGHT hemi thorax. The LEFT lung is expanded. There is centrilobular emphysema. Thin-walled cavitary lesion  in the superior segment of the LEFT lower lobe measuring 2 .2 cm on (image 33, series 405). Small spiculated nodule in the RIGHT upper lobe LEFT upper lobe measures 8 mm on image 19. Most inferior LEFT lower lobe irregular nodule or diaphragm measures 12 mm on image 54. CT comparison from 07/22/2015 demonstrates the short-term stability of the LEFT lung lesions these dense RIGHT consolidation is new. Upper abdomen: Limited view of the liver, kidneys, pancreas are unremarkable. Normal adrenal glands. Musculoskeletal: No aggressive osseous lesion.  IMPRESSION: 1. Dense consolidation within the RIGHT middle and lower lobe and to a lesser degree the RIGHT upper lobe most consistent with MULTILOBAR PNEUMONIA. 2. RIGHT chest tube in place with small apical pneumothorax. Small RIGHT effusion with interspersed gas. 3. Stable pulmonary nodules in the LEFT lung compared to CT 07/22/2015. Recommend followup. Electronically Signed   By: Suzy Bouchard M.D.   On: 08/30/2015 13:55   Dg Chest Port 1 View  09/01/2015  CLINICAL DATA:  Pneumothorax.  Right chest tube. EXAM: PORTABLE CHEST 1 VIEW COMPARISON:  08/31/2015. FINDINGS: Right chest tube in stable position. Stable residual tiny right apical pneumothorax. Dense consolidation right lung unchanged. Right pleural effusion unchanged. Left lung is clear. Heart size is stable. Old left rib fractures. IMPRESSION: 1. Right chest tube in stable position tiny stable apical pneumothorax. 2. Dense consolidation right lung and right pleural effusion unchanged . Electronically Signed   By: Marcello Moores  Register   On: 09/01/2015 07:28   Dg Chest Port 1 View  08/31/2015  CLINICAL DATA:  Evaluate right-sided chest tube. Chest tube was accidentally disconnected from drainage and reconnected by family member. EXAM: PORTABLE CHEST 1 VIEW COMPARISON:  08/31/2015 FINDINGS: Right chest tube remains unchanged in position. Small residual right apical pneumothorax similar to previous study. Diffuse underlying lung disease on the right with areas of consolidation and emphysematous change. Left lung appears clear with compensatory hyperinflation. Normal heart size and pulmonary vascularity. Old left rib fractures. IMPRESSION: No change in appearance of right chest tube, minimal residual right apical pneumothorax, and diffuse infiltration and emphysematous changes in the right lung. Electronically Signed   By: Lucienne Capers M.D.   On: 08/31/2015 22:20   Dg Chest Port 1 View  08/31/2015  CLINICAL DATA:  Pneumonia.  Right chest tube. EXAM:  PORTABLE CHEST 1 VIEW COMPARISON:  CT 08/30/2015.  Chest x-ray 08/30/2015. FINDINGS: Right chest tube in stable position. Stable tiny apical pneumothorax. Persistent right lung infiltrate particular prominent right lung base. Left lung tiny nodules best seen by prior CT. Stable cardiomegaly with normal pulmonary vascularity. Old left rib fractures. IMPRESSION: 1. Right chest tube in stable position. Tiny stable right apical pneumothorax. 2. Persistent dense right lung infiltrate particular prominent right lung base. 3. Left lung tiny pulmonary nodules best identified by CT. 4. Stable cardiomegaly. Electronically Signed   By: Marcello Moores  Register   On: 08/31/2015 07:16    Assessment/Plan:  1 chest tube with air leak on H2O seal, effusion /infiltrates about same 2 on vanc/zosyn- WBC is rising, afebrile currently 3 ccm assisting with management     LOS: 9 days    GOLD,[name] 09/01/2015   I have seen and examined Gabriel [name] and agree with the above assessment  and plan.  [name] Beeper [phone] Office [phone] 09/01/2015 12:31 PM

## 2015-09-01 NOTE — Care Management Note (Signed)
Case Management Note [name] RN, BSN Unit 2W-Case Manager [phone]  Patient Details  Name: Gabriel [name] MRN: [id number] Date of Birth: 1941/09/07  Subjective/Objective:    Pt admitted with increase in Pntx, persistent airleak                Action/Plan: PTA pt lived at home- was active with [facility] for [place] - with chest tube in place at discharge to [facility]- pt will need resumption orders for discharge- if he returns home again with chest tube in place- NCM to follow for d/c needs  Expected Discharge Date:                  Expected Discharge Plan:  [place]  In-House Referral:     Discharge planning Services  CM Consult  Post Acute Care Choice:  Resumption of Svcs/PTA Provider Choice offered to:     DME Arranged:    DME Agency:     HH Arranged:    [place] Agency:  [place]  Status of Service:  In process, will continue to follow  Medicare Important Message Given:  Yes-second notification given Date Medicare IM Given:    Medicare IM give by:    Date Additional Medicare IM Given:    Additional Medicare Important Message give by:     If discussed at [place] of Stay Meetings, dates discussed:  08/30/15, 09/01/15  Additional Comments:  09/01/15 - [name] RN, BSN - pt remains with CT- waterseal- also with RLL airspace dx- on IV abx- continues to need 5L of 02- may need home 02 arranged at discharge- will need to have qualifying ambulation documentation done to qualify for insurance- was active with [facility] for [place] and if needed will need resumption orders- NCM to continue to follow for d/c needs  Dawayne Patricia, RN 09/01/2015, 11:25 AM

## 2015-09-02 ENCOUNTER — Inpatient Hospital Stay (HOSPITAL_COMMUNITY): Payer: 59

## 2015-09-02 LAB — CULTURE, RESPIRATORY W GRAM STAIN: Culture: NORMAL

## 2015-09-02 LAB — COMPREHENSIVE METABOLIC PANEL
ALT: 33 U/L (ref 17–63)
AST: 28 U/L (ref 15–41)
Albumin: 1.7 g/dL — ABNORMAL LOW (ref 3.5–5.0)
Alkaline Phosphatase: 181 U/L — ABNORMAL HIGH (ref 38–126)
Anion gap: 8 (ref 5–15)
BUN: 20 mg/dL (ref 6–20)
CO2: 30 mmol/L (ref 22–32)
Calcium: 8.9 mg/dL (ref 8.9–10.3)
Chloride: 97 mmol/L — ABNORMAL LOW (ref 101–111)
Creatinine, Ser: 0.9 mg/dL (ref 0.61–1.24)
GFR calc Af Amer: >60 mL/min (ref >60)
GFR calc non Af Amer: >60 mL/min (ref >60)
Glucose, Bld: 133 mg/dL — ABNORMAL HIGH (ref 65–99)
Potassium: 3.7 mmol/L (ref 3.5–5.1)
Sodium: 135 mmol/L (ref 135–145)
Total Bilirubin: 1.2 mg/dL (ref 0.3–1.2)
Total Protein: 5.8 g/dL — ABNORMAL LOW (ref 6.5–8.1)

## 2015-09-02 LAB — CBC
HCT: 34.3 % — ABNORMAL LOW (ref 39.0–52.0)
Hemoglobin: 11.6 g/dL — ABNORMAL LOW (ref 13.0–17.0)
MCH: 30.6 pg (ref 26.0–34.0)
MCHC: 33.8 g/dL (ref 30.0–36.0)
MCV: 90.5 fL (ref 78.0–100.0)
Platelets: 237 10*3/uL (ref 150–400)
RBC: 3.79 MIL/uL — ABNORMAL LOW (ref 4.22–5.81)
RDW: 15.1 % (ref 11.5–15.5)
WBC: 22.1 10*3/uL — ABNORMAL HIGH (ref 4.0–10.5)

## 2015-09-02 NOTE — Progress Notes (Signed)
Patient sitting up in bed, no distress. Call light within reach.

## 2015-09-02 NOTE — Progress Notes (Signed)
ANTIBIOTIC CONSULT NOTE - INITIAL  Pharmacy Consult for vancomycin  Indication: rule out pneumonia  Allergies  Allergen Reactions  . Aleve [Naproxen] Rash    Patient Measurements: Height: '5\' 10"'$  (177.8 cm) Weight: 133 lb 12.8 oz (60.691 kg) IBW/kg (Calculated) : 73 Vital Signs: Temp: 99.1 F (37.3 C) (11/04 0415) Temp Source: Oral (11/04 0415) BP: 92/49 mmHg (11/04 0415) Pulse Rate: 96 (11/04 0415) Intake/Output from previous day: 11/03 0701 - 11/04 0700 In: 600 [P.O.:600] Out: 450 [Urine:450] Intake/Output from this shift:    Labs:  Recent Labs  08/31/15 0440 09/01/15 0500 09/02/15 0950  WBC 15.8* 23.7* 22.1*  HGB 9.9* 10.3* 11.6*  PLT 213 209 237  CREATININE 0.83  --  0.90   Estimated Creatinine Clearance: 60.9 mL/min (by C-G formula based on Cr of 0.9). No results for input(s): VANCOTROUGH, VANCOPEAK, VANCORANDOM, GENTTROUGH, GENTPEAK, GENTRANDOM, TOBRATROUGH, TOBRAPEAK, TOBRARND, AMIKACINPEAK, AMIKACINTROU, AMIKACIN in the last 72 hours.   Microbiology: Recent Results (from the past 720 hour(s))  Culture, blood (routine x 2) Call MD if unable to obtain prior to antibiotics being given     Status: None   Collection Time: 08/04/15  4:00 PM  Result Value Ref Range Status   Specimen Description BLOOD RIGHT ANTECUBITAL  Final   Special Requests BOTTLES DRAWN AEROBIC ONLY 10CC  Final   Culture NO GROWTH 5 DAYS  Final   Report Status 08/09/2015 FINAL  Final  Culture, blood (routine x 2) Call MD if unable to obtain prior to antibiotics being given     Status: None   Collection Time: 08/04/15  4:18 PM  Result Value Ref Range Status   Specimen Description BLOOD LEFT ANTECUBITAL  Final   Special Requests BOTTLES DRAWN AEROBIC AND ANAEROBIC 10CC  Final   Culture NO GROWTH 5 DAYS  Final   Report Status 08/09/2015 FINAL  Final  Culture, expectorated sputum-assessment     Status: None   Collection Time: 08/30/15  9:46 AM  Result Value Ref Range Status   Specimen  Description EXPECTORATED SPUTUM  Final   Special Requests Immunocompromised  Final   Sputum evaluation   Final    THIS SPECIMEN IS ACCEPTABLE. RESPIRATORY CULTURE REPORT TO FOLLOW.   Report Status 08/30/2015 FINAL  Final  Culture, respiratory (NON-Expectorated)     Status: None   Collection Time: 08/30/15  9:46 AM  Result Value Ref Range Status   Specimen Description SPUTUM  Final   Special Requests NONE  Final   Gram Stain   Final    MODERATE WBC PRESENT,BOTH PMN AND MONONUCLEAR RARE SQUAMOUS EPITHELIAL CELLS PRESENT FEW GRAM POSITIVE COCCI IN PAIRS IN CLUSTERS RARE GRAM NEGATIVE RODS Performed at [facility]    Culture   Final    NORMAL OROPHARYNGEAL FLORA Performed at [facility]    Report Status 09/02/2015 FINAL  Final    Medical History: Past Medical History  Diagnosis Date  . Spontaneous pneumothorax MANY YRS AGO AND AGAIN 04/04/15    HISTORY OF (ONLY)  . Cancer ([place])     skin cancer  . Lung mass     right / HAS HAD RADIATION AND CHEMO FOR LUNG CANCER  . Inguinal hernia   . Productive cough     "DUE TO RADIATION"  . History of nonmelanoma skin cancer   . Constipation     AFTER ANESTHESIA  . Difficulty sleeping   . Radiation 05/11/15-06/21/15    NSCLCA/right lung  . Pneumothorax on right 08/24/2015  Medications:  Anti-infectives    Start     Dose/Rate Route Frequency Ordered Stop   08/30/15 1145  vancomycin (VANCOCIN) IVPB 750 mg/150 ml premix     750 mg 150 mL/hr over 60 Minutes Intravenous Every 12 hours 08/30/15 1138     08/30/15 0900  piperacillin-tazobactam (ZOSYN) IVPB 3.375 g     3.375 g 12.5 mL/hr over 240 Minutes Intravenous Every 8 hours 08/30/15 0757       Assessment: 74 year old male with concern for pneumonia on chest xray to start vancomycin per pharmacy dosing and Zosyn per MD. Note history of lung cancer and recurrent R-pneumothorax.   Now Day #4 of abx for HCAP. Pulm recommended switching to PO today for another 9 days  but CVTS seems to plan to continue broad spectrum abx. *Was to start chemo 10/10 (see sticky note) but delayed d/t PTX. TCTS following in case VATS needed - not yet. 100 cc out from chest tube in last 24 hrs. Afebrile, WBC remains elevated at 22.1. Last CXR shows R lung consolidation without change. SCr stable, CrCl ~55m/min. UOP 0.367mkg/hr yesterday  Goal of Therapy:  Vancomycin trough level 15-20 mcg/ml  Plan:  Continue Vancomycin 750 mg IV every 12 hours    Continue Zosyn 3.375 g IV every 8 hours - 4hr infusion Monitor clinical picture, renal function, VT prn (should be transitioning to PO soon) F/U C&S, abx deescalation / LOT  NaElenor QuinonesPharmD Clinical Pharmacist Pager [phone]/01/2015 12:16 PM

## 2015-09-02 NOTE — Progress Notes (Addendum)
[address]       [place] [address]             [phone]          Subjective: Not feeling well, weaker  Objective: Vital signs in last 24 hours: Temp:  [98.6 F (37 C)-99.1 F (37.3 C)] 98.6 F (37 C) (11/04 1359) Pulse Rate:  [65-96] 95 (11/04 1359) Cardiac Rhythm:  [-] Normal sinus rhythm (11/04 0700) Resp:  [17-20] 19 (11/04 1359) BP: (92-107)/(49-60) 107/60 mmHg (11/04 1359) SpO2:  [94 %-96 %] 96 % (11/04 1359) Weight:  [133 lb 12.8 oz (60.691 kg)] 133 lb 12.8 oz (60.691 kg) (11/04 0415)  Hemodynamic parameters for last 24 hours:    Intake/Output from previous day: 11/03 0701 - 11/04 0700 In: 600 [P.O.:600] Out: 450 [Urine:450] Intake/Output this shift:    General appearance: alert, cooperative and no distress Heart: regular rate and rhythm Lungs: coarse BS Abdomen: benign Extremities: no edema Wound: CT site ok. Conts with air leak  Lab Results:  Recent Labs  09/01/15 0500 09/02/15 0950  WBC 23.7* 22.1*  HGB 10.3* 11.6*  HCT 30.9* 34.3*  PLT 209 237   BMET:   Recent Labs  08/31/15 0440 09/02/15 0950  NA 133* 135  K 3.8 3.7  CL 96* 97*  CO2 26 30  GLUCOSE 122* 133*  BUN 19 20  CREATININE 0.83 0.90  CALCIUM 8.8* 8.9    PT/INR: No results for input(s): LABPROT, INR in the last 72 hours. ABG    Component Value Date/Time   TCO2 22 04/04/2015 1839   CBG (last 3)  No results for input(s): GLUCAP in the last 72 hours.  Meds Scheduled Meds: . B-complex with vitamin C  1 tablet Oral Daily  . budesonide-formoterol  2 puff Inhalation BID  . docusate sodium  100 mg Oral BID  . enoxaparin (LOVENOX) injection  30 mg Subcutaneous QHS  . feeding supplement (ENSURE ENLIVE)  237 mL Oral BID BM  . guaiFENesin  1,200 mg Oral BID  . magic mouthwash  15 mL Oral TID  . piperacillin-tazobactam (ZOSYN)  IV  3.375 g Intravenous Q8H  . saccharomyces boulardii  250 mg Oral BID  . senna  1 tablet Oral BID  . sodium chloride   3 mL Intravenous Q12H  . vancomycin  750 mg Intravenous Q12H  . vitamin C  1,000 mg Oral Daily   Continuous Infusions:  PRN Meds:.albuterol, ALPRAZolam, bisacodyl, HYDROmorphone (DILAUDID) injection, ondansetron **OR** ondansetron (ZOFRAN) IV, oxyCODONE, traMADol  Xrays Dg Chest Port 1 View  09/02/2015  CLINICAL DATA:  Follow-up chest tubes EXAM: PORTABLE CHEST 1 VIEW COMPARISON:  09/01/2015 FINDINGS: Cardiomediastinal silhouette is stable. Right chest tube is unchanged in position. Stable tiny right apical pneumothorax. Right lung consolidation without change in aeration. Left lung is clear. Left rib fractures are again noted. IMPRESSION: Right chest tube is unchanged in position. Stable tiny right apical pneumothorax. Right lung consolidation without change in aeration. Electronically Signed   By: Lahoma Crocker M.D.   On: 09/02/2015 08:31   Dg Chest Port 1 View  09/01/2015  CLINICAL DATA:  Pneumothorax.  Right chest tube. EXAM: PORTABLE CHEST 1 VIEW COMPARISON:  08/31/2015. FINDINGS: Right chest tube in stable position. Stable residual tiny right apical pneumothorax. Dense consolidation right lung unchanged. Right pleural effusion unchanged. Left lung is clear. Heart size is stable. Old left rib fractures. IMPRESSION: 1. Right chest tube in stable position tiny stable  apical pneumothorax. 2. Dense consolidation right lung and right pleural effusion unchanged . Electronically Signed   By: Marcello Moores  Register   On: 09/01/2015 07:28   Dg Chest Port 1 View  08/31/2015  CLINICAL DATA:  Evaluate right-sided chest tube. Chest tube was accidentally disconnected from drainage and reconnected by family member. EXAM: PORTABLE CHEST 1 VIEW COMPARISON:  08/31/2015 FINDINGS: Right chest tube remains unchanged in position. Small residual right apical pneumothorax similar to previous study. Diffuse underlying lung disease on the right with areas of consolidation and emphysematous change. Left lung appears clear with  compensatory hyperinflation. Normal heart size and pulmonary vascularity. Old left rib fractures. IMPRESSION: No change in appearance of right chest tube, minimal residual right apical pneumothorax, and diffuse infiltration and emphysematous changes in the right lung. Electronically Signed   By: Lucienne Capers M.D.   On: 08/31/2015 22:20   Results for orders placed or performed during the hospital encounter of 08/23/15  Culture, expectorated sputum-assessment     Status: None   Collection Time: 08/30/15  9:46 AM  Result Value Ref Range Status   Specimen Description EXPECTORATED SPUTUM  Final   Special Requests Immunocompromised  Final   Sputum evaluation   Final    THIS SPECIMEN IS ACCEPTABLE. RESPIRATORY CULTURE REPORT TO FOLLOW.   Report Status 08/30/2015 FINAL  Final  Culture, respiratory (NON-Expectorated)     Status: None   Collection Time: 08/30/15  9:46 AM  Result Value Ref Range Status   Specimen Description SPUTUM  Final   Special Requests NONE  Final   Gram Stain   Final    MODERATE WBC PRESENT,BOTH PMN AND MONONUCLEAR RARE SQUAMOUS EPITHELIAL CELLS PRESENT FEW GRAM POSITIVE COCCI IN PAIRS IN CLUSTERS RARE GRAM NEGATIVE RODS Performed at [facility]    Culture   Final    NORMAL OROPHARYNGEAL FLORA Performed at [facility]    Report Status 09/02/2015 FINAL  Final   Assessment/Plan:  1 appears about same clinically, tmax 99.1 2 will get CBC , CXR in am 3 cont current abx 4 cont pulm toilet   LOS: 10 days     Wbc still elevated , rare gram negative rods in sputum   Grace Isaac 09/02/2015

## 2015-09-03 ENCOUNTER — Inpatient Hospital Stay (HOSPITAL_COMMUNITY): Payer: 59

## 2015-09-03 LAB — BASIC METABOLIC PANEL
Anion gap: 9 (ref 5–15)
BUN: 18 mg/dL (ref 6–20)
CO2: 30 mmol/L (ref 22–32)
Calcium: 8.5 mg/dL — ABNORMAL LOW (ref 8.9–10.3)
Chloride: 95 mmol/L — ABNORMAL LOW (ref 101–111)
Creatinine, Ser: 0.85 mg/dL (ref 0.61–1.24)
GFR calc Af Amer: >60 mL/min (ref >60)
GFR calc non Af Amer: >60 mL/min (ref >60)
Glucose, Bld: 100 mg/dL — ABNORMAL HIGH (ref 65–99)
Potassium: 3.4 mmol/L — ABNORMAL LOW (ref 3.5–5.1)
Sodium: 134 mmol/L — ABNORMAL LOW (ref 135–145)

## 2015-09-03 LAB — CBC WITH DIFFERENTIAL/PLATELET
BASOS PCT: 0 %
Basophils Absolute: 0 10*3/uL (ref 0.0–0.1)
EOS ABS: 0.2 10*3/uL (ref 0.0–0.7)
EOS PCT: 1 %
HCT: 29.9 % — ABNORMAL LOW (ref 39.0–52.0)
HEMOGLOBIN: 10.1 g/dL — AB (ref 13.0–17.0)
LYMPHS PCT: 5 %
Lymphs Abs: 0.8 10*3/uL (ref 0.7–4.0)
MCH: 30.4 pg (ref 26.0–34.0)
MCHC: 33.8 g/dL (ref 30.0–36.0)
MCV: 90.1 fL (ref 78.0–100.0)
Monocytes Absolute: 0.8 10*3/uL (ref 0.1–1.0)
Monocytes Relative: 5 %
NEUTROS PCT: 89 %
Neutro Abs: 14.8 10*3/uL — ABNORMAL HIGH (ref 1.7–7.7)
Platelets: 227 10*3/uL (ref 150–400)
RBC: 3.32 MIL/uL — AB (ref 4.22–5.81)
RDW: 15.2 % (ref 11.5–15.5)
WBC: 16.6 10*3/uL — AB (ref 4.0–10.5)

## 2015-09-03 MED ORDER — POLYETHYLENE GLYCOL 3350 17 G PO PACK
17.0000 g | PACK | Freq: Every day | ORAL | Status: DC | PRN
  Filled 2015-09-03: qty 1

## 2015-09-03 MED ORDER — POLYETHYLENE GLYCOL 3350 17 G PO PACK: 17.0000 g | PACK | Freq: Every day | ORAL | Status: DC

## 2015-09-03 MED ORDER — DOCUSATE SODIUM 100 MG PO CAPS
100.0000 mg | ORAL_CAPSULE | Freq: Three times a day (TID) | ORAL | Status: DC
  Administered 2015-09-03 – 2015-09-06 (×9): 100 mg via ORAL
  Filled 2015-09-03 (×16): qty 1

## 2015-09-03 NOTE — Progress Notes (Addendum)
      [address]       [place] [address]             [phone]    Subjective:  Gabriel [name] states he is feeling a little better.  He continues to have some productive sputum.  He also states he is really having to strain to move his bowels.  Objective: Vital signs in last 24 hours: Temp:  [97.5 F (36.4 C)-98.6 F (37 C)] 97.5 F (36.4 C) (11/04 2119) Pulse Rate:  [95-98] 98 (11/04 2119) Cardiac Rhythm:  [-] Normal sinus rhythm (11/05 1008) Resp:  [19] 19 (11/04 2119) BP: (107-117)/(60-64) 117/64 mmHg (11/04 2119) SpO2:  [95 %-97 %] 95 % (11/04 2119)  Intake/Output from previous day: 11/04 0701 - 11/05 0700 In: -  Out: 450 [Urine:450] Intake/Output this shift: Total I/O In: 50 [IV Piggyback:50] Out: -   General appearance: alert, cooperative and no distress Heart: regular rate and rhythm Lungs: clear to auscultation bilaterally Abdomen: soft, non-tender; bowel sounds normal; no masses,  no organomegaly Wound: clean and dry  Lab Results:  Recent Labs  09/02/15 0950 09/03/15 0310  WBC 22.1* 16.6*  HGB 11.6* 10.1*  HCT 34.3* 29.9*  PLT 237 227   BMET:  Recent Labs  09/02/15 0950 09/03/15 0310  NA 135 134*  K 3.7 3.4*  CL 97* 95*  CO2 30 30  GLUCOSE 133* 100*  BUN 20 18  CREATININE 0.90 0.85  CALCIUM 8.9 8.5*    PT/INR: No results for input(s): LABPROT, INR in the last 72 hours. ABG    Component Value Date/Time   TCO2 22 04/04/2015 1839   CBG (last 3)  No results for input(s): GLUCAP in the last 72 hours.  Assessment/Plan:  1. Chest tube- no air leak, CXR free from pneumothorax- will leave chest tube to water seal for now 2. Pulm- weaning oxygen as tolerated, CXR with opacities on right side, continue IS 3. ID- not febrile, leukocytosis improving continue Vanc, Zosyn for presumed pneumonia 4. GI- constipation- will increase Colace, add Miralax prn 5. Dispo- patient stable, leukocytosis improving, not febrile, chest tube  without air leak/no pneumothorax on CXR leave tube on water seal   LOS: 11 days    [name], Gabriel 09/03/2015  patient examined and medical record reviewed,agree with above note. Tharon Aquas Trigt III 09/03/2015

## 2015-09-03 NOTE — Progress Notes (Signed)
No new drainage overnight

## 2015-09-04 ENCOUNTER — Inpatient Hospital Stay (HOSPITAL_COMMUNITY): Payer: 59

## 2015-09-04 NOTE — Progress Notes (Addendum)
      [address]       [name] [address]             [phone]      Subjective:  Gabriel [name] states he continues to feel a little better.  He does have some residual weakness.  He is ambulating  Objective: Vital signs in last 24 hours: Temp:  [97.9 F (36.6 C)-99.9 F (37.7 C)] 97.9 F (36.6 C) (11/06 0536) Pulse Rate:  [85-95] 85 (11/06 0933) Cardiac Rhythm:  [-] Normal sinus rhythm (11/05 1008) Resp:  [16-20] 18 (11/06 0933) BP: (103-113)/(55-60) 113/60 mmHg (11/06 0536) SpO2:  [93 %-98 %] 95 % (11/06 0933) Weight:  [136 lb 4.8 oz (61.825 kg)] 136 lb 4.8 oz (61.825 kg) (11/06 0536)  Intake/Output from previous day: 11/05 0701 - 11/06 0700 In: 250 [IV Piggyback:250] Out: 11 [Stool:1; Chest Tube:10] Intake/Output this shift: Total I/O In: 50 [IV Piggyback:50] Out: -   General appearance: alert, cooperative and no distress Heart: regular rate and rhythm Lungs: clear to auscultation bilaterally Abdomen: soft, non-tender; bowel sounds normal; no masses,  no organomegaly Wound: clean and dry  Lab Results:  Recent Labs  09/02/15 0950 09/03/15 0310  WBC 22.1* 16.6*  HGB 11.6* 10.1*  HCT 34.3* 29.9*  PLT 237 227   BMET:  Recent Labs  09/02/15 0950 09/03/15 0310  NA 135 134*  K 3.7 3.4*  CL 97* 95*  CO2 30 30  GLUCOSE 133* 100*  BUN 20 18  CREATININE 0.90 0.85  CALCIUM 8.9 8.5*    PT/INR: No results for input(s): LABPROT, INR in the last 72 hours. ABG    Component Value Date/Time   TCO2 22 04/04/2015 1839   CBG (last 3)  No results for input(s): GLUCAP in the last 72 hours.  Assessment/Plan:  1. Chest tube- small 1+ air leak this morning- CXR remains free from pneumothorax- will leave chest tube to waterseal 2. Pulm- continue to wean oxygen as tolerated, continue IS 3. ID- remains afebrile, continue broad spectrum ABX 4. Dispo- small air leak today, leave chest tube on water seal, CXR free from pneumothorax... Repeat CXR in  AM   LOS: 12 days    BARRETT, ERIN 09/04/2015  patient examined and medical record reviewed,agree with above note. Tharon Aquas Trigt III 09/04/2015

## 2015-09-05 ENCOUNTER — Other Ambulatory Visit: Payer: 59

## 2015-09-05 ENCOUNTER — Inpatient Hospital Stay (HOSPITAL_COMMUNITY): Payer: 59

## 2015-09-05 ENCOUNTER — Ambulatory Visit: Payer: 59

## 2015-09-05 LAB — VANCOMYCIN, TROUGH: Vancomycin Tr: 9 ug/mL — ABNORMAL LOW (ref 10.0–20.0)

## 2015-09-05 MED ORDER — VANCOMYCIN HCL IN DEXTROSE 1-5 GM/200ML-% IV SOLN
1000.0000 mg | Freq: Two times a day (BID) | INTRAVENOUS | Status: DC
  Administered 2015-09-05 – 2015-09-06 (×2): 1000 mg via INTRAVENOUS
  Filled 2015-09-05 (×3): qty 200

## 2015-09-05 NOTE — Progress Notes (Signed)
Vancomycin q12H/Zosyn q 8/ HCAP/ CT to water seal. Will have CXR in am. Ambulatory. CM will continue to follow for discharge needs.

## 2015-09-05 NOTE — Progress Notes (Addendum)
      [address]       [place] [address]             [phone]          Subjective: Patient has no complaints this am.  Objective: Vital signs in last 24 hours: Temp:  [98.2 F (36.8 C)-98.4 F (36.9 C)] 98.4 F (36.9 C) (11/07 0345) Pulse Rate:  [85-91] 90 (11/07 0345) Cardiac Rhythm:  [-]  Resp:  [17-18] 18 (11/07 0345) BP: (114-130)/(61) 122/61 mmHg (11/07 0345) SpO2:  [90 %-96 %] 90 % (11/07 0345)     Intake/Output from previous day: 11/06 0701 - 11/07 0700 In: 487 [P.O.:237; IV Piggyback:250] Out: 700 [Urine:700]   Physical Exam:  Cardiovascular: RRR Pulmonary: Coarse on right and mostly clear on left Abdomen: Soft, non tender, bowel sounds present. Extremities: No lower extremity edema. Wounds: Clean and dry.  No erythema or signs of infection. Chest Tube: to water seal, no air leak  Lab Results: CBC:  Recent Labs  09/02/15 0950 09/03/15 0310  WBC 22.1* 16.6*  HGB 11.6* 10.1*  HCT 34.3* 29.9*  PLT 237 227   BMET:   Recent Labs  09/02/15 0950 09/03/15 0310  NA 135 134*  K 3.7 3.4*  CL 97* 95*  CO2 30 30  GLUCOSE 133* 100*  BUN 20 18  CREATININE 0.90 0.85  CALCIUM 8.9 8.5*    PT/INR: No results for input(s): LABPROT, INR in the last 72 hours. ABG:  INR: Will add last result for INR, ABG once components are confirmed Will add last 4 CBG results once components are confirmed  Assessment/Plan:  1. CV - SR/ slightly tachy in the 90's-low 100's 2.  Pulmonary - On 3 liters of oxygen via [place].Chest tube with scant output last 24 hours. Chest tube is to water seal. There appears to be no air leak. CXR shows some loculated air near apex,diffuse right airspace disease most compatible with PNA and left lung clear. Mucinex for cough 3. Mild hyponatremia-sodium up to 133 4. Anemia-H and H 10.1 and 29.9 5. Leukocytosis-Last WBC down to 16,600. Likely HCAP.On Zosyn and Vanco. Will discuss with Dr. Servando Snare about when to  transition to oral antibiotic.   ZIMMERMAN,DONIELLE MPA-C 09/05/2015,9:04 AM   Slowly improving with treatment of pneumonia, no air lek today Wbc improving Continue iv antibiotics I have seen and examined Gabriel [name] and agree with the above assessment  and plan.  [name] Beeper [phone] Office [phone] 09/05/2015 7:28 PM

## 2015-09-05 NOTE — Progress Notes (Signed)
ANTIBIOTIC CONSULT NOTE -   Pharmacy Consult for vancomycin  Indication: rule out pneumonia  Allergies  Allergen Reactions  . Aleve [Naproxen] Rash    Patient Measurements: Height: '5\' 10"'$  (177.8 cm) Weight: 136 lb 4.8 oz (61.825 kg) IBW/kg (Calculated) : 73 Vital Signs: Temp: 98.4 F (36.9 C) (11/07 0345) Temp Source: Oral (11/07 0345) BP: 122/61 mmHg (11/07 0345) Pulse Rate: 90 (11/07 0345) Intake/Output from previous day: 11/06 0701 - 11/07 0700 In: 487 [P.O.:237; IV Piggyback:250] Out: 700 [Urine:700] Intake/Output from this shift: Total I/O In: 240 [P.O.:240] Out: -   Labs:  Recent Labs  09/03/15 0310  WBC 16.6*  HGB 10.1*  PLT 227  CREATININE 0.85   Estimated Creatinine Clearance: 65.6 mL/min (by C-G formula based on Cr of 0.85).  Recent Labs  09/05/15 1233  [place] 9*     Microbiology: Recent Results (from the past 720 hour(s))  Culture, expectorated sputum-assessment     Status: None   Collection Time: 08/30/15  9:46 AM  Result Value Ref Range Status   Specimen Description EXPECTORATED SPUTUM  Final   Special Requests Immunocompromised  Final   Sputum evaluation   Final    THIS SPECIMEN IS ACCEPTABLE. RESPIRATORY CULTURE REPORT TO FOLLOW.   Report Status 08/30/2015 FINAL  Final  Culture, respiratory (NON-Expectorated)     Status: None   Collection Time: 08/30/15  9:46 AM  Result Value Ref Range Status   Specimen Description SPUTUM  Final   Special Requests NONE  Final   Gram Stain   Final    MODERATE WBC PRESENT,BOTH PMN AND MONONUCLEAR RARE SQUAMOUS EPITHELIAL CELLS PRESENT FEW GRAM POSITIVE COCCI IN PAIRS IN CLUSTERS RARE GRAM NEGATIVE RODS Performed at [facility]    Culture   Final    NORMAL OROPHARYNGEAL FLORA Performed at [facility]    Report Status 09/02/2015 FINAL  Final    Medical History: Past Medical History  Diagnosis Date  . Spontaneous pneumothorax MANY YRS AGO AND AGAIN 04/04/15    HISTORY OF  (ONLY)  . Cancer ([place])     skin cancer  . Lung mass     right / HAS HAD RADIATION AND CHEMO FOR LUNG CANCER  . Inguinal hernia   . Productive cough     "DUE TO RADIATION"  . History of nonmelanoma skin cancer   . Constipation     AFTER ANESTHESIA  . Difficulty sleeping   . Radiation 05/11/15-06/21/15    NSCLCA/right lung  . Pneumothorax on right 08/24/2015    Medications:  Anti-infectives    Start     Dose/Rate Route Frequency Ordered Stop   09/05/15 1400  vancomycin (VANCOCIN) IVPB 1000 mg/200 mL premix     1,000 mg 200 mL/hr over 60 Minutes Intravenous Every 12 hours 09/05/15 1352     08/30/15 1145  vancomycin (VANCOCIN) IVPB 750 mg/150 ml premix  Status:  Discontinued     750 mg 150 mL/hr over 60 Minutes Intravenous Every 12 hours 08/30/15 1138 09/05/15 1352   08/30/15 0900  piperacillin-tazobactam (ZOSYN) IVPB 3.375 g     3.375 g 12.5 mL/hr over 240 Minutes Intravenous Every 8 hours 08/30/15 0757       Assessment: 74 year old male with concern for pneumonia on chest xray to start vancomycin per pharmacy dosing and Zosyn per MD. Note history of lung cancer and recurrent R-pneumothorax.   Now Day #7  of abx for HCAP per CCM plan 12 days tx *Was to start  chemo 10/10 (see sticky note) but delayed d/t PTX. TCTS following in case VATS needed - not yet. 100 cc out from chest tube in last 24 hrs. Afebrile, WBC remains elevated but improved 22.1 > 16. Last CXR shows R lung consolidation without change. SCr stable, CrCl ~19m/min. UOP 0.370mkg/hr yesterday.   Vancomycin trough = 9 ON '750MG'$  Q12 - lower than goal.  Goal of Therapy:  Vancomycin trough level 15-20 mcg/ml  Plan:  Increase Vancomycin 1g IV every 12 hours    Continue Zosyn 3.375 g IV every 8 hours - 4hr infusion F/U C&S, abx deescalation / LOT  LiBonnita Nasutiharm.D. CPP, BCPS Clinical Pharmacist [phone]/04/2015 2:12 PM

## 2015-09-06 ENCOUNTER — Inpatient Hospital Stay (HOSPITAL_COMMUNITY): Payer: 59

## 2015-09-06 DIAGNOSIS — C3491 Malignant neoplasm of unspecified part of right bronchus or lung: Secondary | ICD-10-CM

## 2015-09-06 LAB — CBC
HEMATOCRIT: 32.1 % — AB (ref 39.0–52.0)
HEMOGLOBIN: 10.3 g/dL — AB (ref 13.0–17.0)
MCH: 29.1 pg (ref 26.0–34.0)
MCHC: 32.1 g/dL (ref 30.0–36.0)
MCV: 90.7 fL (ref 78.0–100.0)
Platelets: 230 10*3/uL (ref 150–400)
RBC: 3.54 MIL/uL — AB (ref 4.22–5.81)
RDW: 15.4 % (ref 11.5–15.5)
WBC: 12.7 10*3/uL — AB (ref 4.0–10.5)

## 2015-09-06 LAB — CREATININE, SERUM
Creatinine, Ser: 0.83 mg/dL (ref 0.61–1.24)
GFR calc Af Amer: >60 mL/min (ref >60)
GFR calc non Af Amer: >60 mL/min (ref >60)

## 2015-09-06 MED ORDER — AMOXICILLIN-POT CLAVULANATE 875-125 MG PO TABS
1.0000 | ORAL_TABLET | Freq: Two times a day (BID) | ORAL | Status: AC
  Administered 2015-09-06 – 2015-09-13 (×14): 1 via ORAL
  Filled 2015-09-06 (×15): qty 1

## 2015-09-06 NOTE — Progress Notes (Addendum)
[address]       [place] [address]             [phone]          Subjective: Feeling somewhat better but weak  Objective: Vital signs in last 24 hours: Temp:  [98.1 F (36.7 C)-98.7 F (37.1 C)] 98.1 F (36.7 C) (11/08 0551) Pulse Rate:  [81-88] 85 (11/08 0551) Cardiac Rhythm:  [-]  Resp:  [18] 18 (11/08 0551) BP: (112-127)/(56-58) 127/58 mmHg (11/08 0551) SpO2:  [92 %-98 %] 92 % (11/08 0551) Weight:  [139 lb 1.6 oz (63.095 kg)] 139 lb 1.6 oz (63.095 kg) (11/08 0551)  Hemodynamic parameters for last 24 hours:    Intake/Output from previous day: 11/07 0701 - 11/08 0700 In: 720 [P.O.:720] Out: 505 [Urine:500; Chest Tube:5] Intake/Output this shift:    General appearance: alert, cooperative and no distress Heart: regular rate and rhythm Lungs: some coarseness on right  Lab Results:  Recent Labs  09/06/15 0515  WBC 12.7*  HGB 10.3*  HCT 32.1*  PLT 230   BMET:  Recent Labs  09/06/15 0515  CREATININE 0.83    PT/INR: No results for input(s): LABPROT, INR in the last 72 hours. ABG    Component Value Date/Time   TCO2 22 04/04/2015 1839   CBG (last 3)  No results for input(s): GLUCAP in the last 72 hours.  Meds Scheduled Meds: . B-complex with vitamin C  1 tablet Oral Daily  . budesonide-formoterol  2 puff Inhalation BID  . docusate sodium  100 mg Oral TID  . enoxaparin (LOVENOX) injection  30 mg Subcutaneous QHS  . feeding supplement (ENSURE ENLIVE)  237 mL Oral BID BM  . guaiFENesin  1,200 mg Oral BID  . magic mouthwash  15 mL Oral TID  . piperacillin-tazobactam (ZOSYN)  IV  3.375 g Intravenous Q8H  . saccharomyces boulardii  250 mg Oral BID  . senna  1 tablet Oral BID  . sodium chloride  3 mL Intravenous Q12H  . vancomycin  1,000 mg Intravenous Q12H  . vitamin C  1,000 mg Oral Daily   Continuous Infusions:  PRN Meds:.albuterol, ALPRAZolam, bisacodyl, HYDROmorphone (DILAUDID) injection, ondansetron **OR**  ondansetron (ZOFRAN) IV, oxyCODONE, polyethylene glycol, traMADol  Xrays Dg Chest Port 1 View  09/06/2015  CLINICAL DATA:  Pneumonia.  Chest tube in place EXAM: PORTABLE CHEST 1 VIEW COMPARISON:  September 05, 2015 FINDINGS: Chest tube remains on the right, unchanged in position. No demonstrable pneumothorax. There is widespread airspace consolidation on the right with multiple areas of loculated air, stable. There is evidence of a degree of loculated effusion at the right base. The left lung is mildly hyperexpanded. Interstitial prominence on the left is probably due to redistribution of blood flow to viable lung segments. There is no frank edema or consolidation on the left. Heart is slightly enlarged with pulmonary vascularity stable and within normal limits. There is old rib trauma on the left with healing. There is a skin fold on the left. IMPRESSION: No appreciable change from 1 day prior. Extensive consolidation on the right with multiple areas of loculated air but no convincing pneumothorax. Chest tube position unchanged. Loculated effusion right base. No new opacity. No change in cardiac silhouette. Electronically Signed   By: [name]   On: 09/06/2015 07:26   Dg Chest Port 1 View  09/05/2015  CLINICAL DATA:  Chest tubes in place.  Pneumonia. EXAM: PORTABLE CHEST -  1 VIEW COMPARISON:  One-view chest x-ray 09/04/2015. FINDINGS: The right-sided chest tube remains in place. There is no definite pneumothorax although there is some loculated air near the lung apex. The trachea is deviated to the right. The heart is enlarged. Diffuse patchy opacification of the right lung persists. The left lung is clear. IMPRESSION: 1. Stable appearance of diffuse right-sided airspace disease most compatible with pneumonia. 2. Stable positioning of a right-sided chest tube. Electronically Signed   By: San Morelle M.D.   On: 09/05/2015 07:39   Wt Readings from Last 3 Encounters:  09/06/15 139 lb  1.6 oz (63.095 kg)  08/23/15 133 lb (60.328 kg)  07/28/15 132 lb 7.9 oz (60.1 kg)   Assessment/Plan:  1 chest tube with no air leak, no pneumothorax, otherwise unchanged opacities 2 conts  IV abx , poss convert to po soon    LOS: 14 days    GOLD,[name] 09/06/2015  I have discussed having palliative care see patient to help with his goal of getting home independent. He lives alone  Change to po antibiotics Convert to miniexpress- no air leak currently Wbc improving Patient eating better but still , with  Severe malnutrition in context of chronic illness and  Malnutrition related to cancer and cancer related treatments as evidenced by moderate depletion of body fat, severe depletion of muscle mass  I have seen and examined [name] and agree with the above assessment  and plan.  [name] Beeper [phone] Office [phone] 09/06/2015 9:36 AM

## 2015-09-06 NOTE — Progress Notes (Signed)
Name: Gabriel [name] MRN: [id number] DOB: [birth year]    ADMISSION DATE:  08/23/2015 CONSULTATION DATE:  11/1   REFERRING MD :  Dr. Servando Snare   CHIEF COMPLAINT:  PNA   BRIEF PATIENT DESCRIPTION: 74 y/o M, current smoker, with PMH of adenocarcinoma of the lung and recurrent PTX who presented to [facility] on 10/25 with recurrent PTX.  CXR had evolving RLL airspace disease and PCCM consulted for evaluation.   SIGNIFICANT EVENTS  10/25  Admit for recurrent PTX despite chest tube  11/01  PCCM consulted for HCAP   STUDIES: CT Chest W/O (11/1) - dense consolidation within the RML, RLL, RUL c/w multilobar PNA, R chest tube with small apical ptx, small R effusion with interspersed gas, stable pulmonary nodules in L lung  CULTURES: Sputum (11/1):  Oral [place]ora Blood (10/6):  Negative x2  ANTIBIOTICS: Augmentin 11/8>>  Vancomycin 11/1 - 11/8 Zosyn 11/1 - 11/8  SUBJECTIVE: Patient denies any new chest pain. He reports pain in his right thorax only with specific movements. Denies any pleurisy. Minimal intermittent coughing productive of a clear or sometimes yellow phlegm. Denies any hemoptysis recently. Dyspnea improving.  REVIEW OF SYSTEMS: Patient denies any subjective fever, chills, or sweats. He denies any nausea or vomiting.   VITAL SIGNS: Temp:  [98.1 F (36.7 C)-98.7 F (37.1 C)] 98.1 F (36.7 C) (11/08 1336) Pulse Rate:  [81-92] 92 (11/08 1336) Resp:  [18] 18 (11/08 1336) BP: (112-127)/(56-58) 123/57 mmHg (11/08 1336) SpO2:  [92 %-100 %] 100 % (11/08 1336) Weight:  [63.095 kg (139 lb 1.6 oz)] 63.095 kg (139 lb 1.6 oz) (11/08 0551)  PHYSICAL EXAMINATION: General:  Awake. Alert. No acute distress. Sitting up in bed talking to Dr. Servando Snare.  Integument:  Warm & dry. No rash on exposed skin. Right-sided chest tube in place. HEEN:  Moist mucus membranes. No oral ulcers. No scleral injection or icterus.  Cardiovascular:  Regular rate. No edema.  Pulmonary:  Mild crackles particularly  in the right lung. Intermittent wheezing on exhalation. No accessory muscle use. Abdomen: Soft. Normal bowel sounds. Nondistended.    Recent Labs Lab 08/31/15 0440 09/02/15 0950 09/03/15 0310 09/06/15 0515  NA 133* 135 134*  --   K 3.8 3.7 3.4*  --   CL 96* 97* 95*  --   CO2 '26 30 30  '$ --   BUN '19 20 18  '$ --   CREATININE 0.83 0.90 0.85 0.83  GLUCOSE 122* 133* 100*  --     Recent Labs Lab 09/02/15 0950 09/03/15 0310 09/06/15 0515  HGB 11.6* 10.1* 10.3*  HCT 34.3* 29.9* 32.1*  WBC 22.1* 16.6* 12.7*  PLT 237 227 230   Dg Chest Port 1 View  09/06/2015  CLINICAL DATA:  Pneumonia.  Chest tube in place EXAM: PORTABLE CHEST 1 VIEW COMPARISON:  September 05, 2015 FINDINGS: Chest tube remains on the right, unchanged in position. No demonstrable pneumothorax. There is widespread airspace consolidation on the right with multiple areas of loculated air, stable. There is evidence of a degree of loculated effusion at the right base. The left lung is mildly hyperexpanded. Interstitial prominence on the left is probably due to redistribution of blood flow to viable lung segments. There is no frank edema or consolidation on the left. Heart is slightly enlarged with pulmonary vascularity stable and within normal limits. There is old rib trauma on the left with healing. There is a skin fold on the left. IMPRESSION: No appreciable change from 1 day prior. Extensive consolidation  on the right with multiple areas of loculated air but no convincing pneumothorax. Chest tube position unchanged. Loculated effusion right base. No new opacity. No change in cardiac silhouette. Electronically Signed   By: [name]   On: 09/06/2015 07:26   Dg Chest Port 1 View  09/05/2015  CLINICAL DATA:  Chest tubes in place.  Pneumonia. EXAM: PORTABLE CHEST - 1 VIEW COMPARISON:  One-view chest x-ray 09/04/2015. FINDINGS: The right-sided chest tube remains in place. There is no definite pneumothorax although there is  some loculated air near the lung apex. The trachea is deviated to the right. The heart is enlarged. Diffuse patchy opacification of the right lung persists. The left lung is clear. IMPRESSION: 1. Stable appearance of diffuse right-sided airspace disease most compatible with pneumonia. 2. Stable positioning of a right-sided chest tube. Electronically Signed   By: San Morelle M.D.   On: 09/05/2015 07:39    ASSESSMENT / PLAN:  Unfortunate 74 year old male with acute on chronic hypoxic respiratory failure secondary to healthcare associated pneumonia complicated by a presumed COPD exacerbation and underlying stage III adenocarcinoma the right lung. Patient's leukocytosis is improving with continued antibiotic therapy. Cultures have yielded no specific organism to target. I suspect there may be some component of postobstructive pneumonia given his underlying malignancy. Thoracic surgery is managing the patient's right-sided chest tube and I question whether or not the placement of the tip could be parenchymal. Even so the patient has no air leak at this time. Given the improvement in the patient's leukocytosis & his afebrile state I feel it's reasonable to transition to an oral antibiotic. Case was discussed with Dr. Servando Snare at bedside.  1. Healthcare associated pneumonia: Transitioning to Augmentin 875 mg by mouth twice a day. Patient is currently on day #8/14 of antibiotics. Recommend observing the patient for the next 24-48 hours on oral antibiotics to ensure continued clinical stability. 2. Acute on chronic hypoxic respiratory failure: Improving with pain control and pulmonary toilette. 3. Right pneumothorax: Status post chest tube placement. Management per thoracic surgery. Chest tube pain control with oxycodone & Dilaudid. 4. Stage III adenocarcinoma the right lung: Management per oncology. 5. Presumed COPD: Patient continuing on Symbicort 160/4.5. 6. Protein calorie malnutrition: Vitamin C and B  complex supplementation. Calorie supplementation with Ensure.  Sonia Baller Ashok Cordia, M.D. [facility] Pager:  [phone] After 3pm or if no response, call [phone]  09/06/2015 2:05 PM

## 2015-09-06 NOTE — Clinical Documentation Improvement (Signed)
Cardiology Cardiothoracic Critical Care  Can the statement from Note 11/3 of "At Risk Protein Calorie Malnutrition" be further specified in light of RD nutrition diagnosis of 11/2?   Document Severity - Severe(third degree), Moderate (second degree), Mild (first degree)  Other condition  Unable to clinically determine  Supporting Information: Per RD eval 11/2:  -  "Severe malnutrition in context of chronic illness" -  "Malnutrition related to cancer and cancer related treatments as evidenced by moderate depletion of body fat, severe depletion of muscle mass"  Please exercise your independent, professional judgment when responding. A specific answer is not anticipated or expected.  Thank You, Ezekiel Ina RN [place] [phone]

## 2015-09-07 DIAGNOSIS — Z515 Encounter for palliative care: Secondary | ICD-10-CM

## 2015-09-07 DIAGNOSIS — R531 Weakness: Secondary | ICD-10-CM

## 2015-09-07 DIAGNOSIS — R5383 Other fatigue: Secondary | ICD-10-CM | POA: Insufficient documentation

## 2015-09-07 DIAGNOSIS — J449 Chronic obstructive pulmonary disease, unspecified: Secondary | ICD-10-CM

## 2015-09-07 NOTE — Consult Note (Signed)
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 Completed cycle of radiation therapy Sept 2016. Was planned for cycle #2 chemotherapy 08/08/15 however has been hospitalized with recurrent pneumothorax despite chest tube. Also being treated for HCAP. Progress has been very slow and he is very debilitated.   I met today with Gabriel [name] ([name] joined [place] later). We discussed his cancer and recent hospital stays. Gabriel [name] tells me that his goals are to return home and to work towards continuing treatment for his cancer. He understands that there are many barriers in place for this to happen but he says he is very motivated to do what he needs to do to get to a more functional status. "I'm not ready to give up... unless Dr. Servando Snare tells me it is time to give up." He says that he has been trying to do exercises in his room and will do anything that he needs to "try and get stronger." We discussed a variety of needs such as nutrition, pain management, physical therapy to work towards these goals. Discussed that this is going to be a slow process at best. He is not open to considering short term rehab at this time. He is worried about being at home and having energy and breathe to be able to get to the bathroom and back and is hoping that this will improve prior to discharge. Discussed equipment such as rollator walker that has a seat for him to rest if needed while ambulating in home. He is open to anything that may help him manage at home and help him to "get stronger." Appears to be realistic that he may  not get back to a good place to tolerate chemotherapy well but he is unwilling to accept this unless told so by Dr. Servando Snare or Dr. Mckinley Jewel.   Gabriel [name] says that his right chest pain is overall getting better but that the oxycodone works well for him after 45 minutes of taking. He says he occasionally has sharp spasm pain that comes suddenly and the oxycodone helps with this pain as well but is difficult to tolerate for the 45 minutes waiting for effect. He also complains of anxiety but believes the xanax is working well to manage this. I would not change at this point.   - Goal is to continue aggressive care with hopes of gaining enough strength to continue plans for further chemotherapy. Also goal to return to his home.   I will follow up tomorrow.   Contacts/Participants in Discussion: Primary Decision Maker: Self     SUMMARY OF RECOMMENDATIONS  Code Status/Advance Care Planning: Full code - did not get a chance to discuss today    Code Status Orders        Start     Ordered   08/23/15 1805  Full code   Continuous     08/23/15 1807       Symptom Management:   Pain: Oxycodone 5 mg every 4 hours prn.   Anxiety: Xanax 0.5 mg TID prn.   SOB: Continue to manage CT per CVTS.   Weakness: Consider PT consult. Optimize nutrition.  Malnutrition: Discussed important  of nutrition with small frequent meals/snacks. Supplement between meals to optimize caloric energy to work towards his goals.   Palliative Prophylaxis:   Bowel Regimen and Delirium Protocol   Psycho-social/Spiritual:  Support System: Strong Desire for further Chaplaincy support:no Additional Recommendations: Caregiving  Support/Resources  Prognosis: Unable to determine but likely very limited given recent events.   Discharge Planning: Home with Home Health   Chief Complaint/ Primary Diagnoses: Present on Admission:  . Chest tube in place . Non-small cell carcinoma of right lung, stage 3 ([place]) .  Pneumothorax  I have reviewed the medical record, interviewed the patient and family, and examined the patient. The following aspects are pertinent.  Past Medical History  Diagnosis Date  . Spontaneous pneumothorax MANY YRS AGO AND AGAIN 04/04/15    HISTORY OF (ONLY)  . Cancer ([place])     skin cancer  . Lung mass     right / HAS HAD RADIATION AND CHEMO FOR LUNG CANCER  . Inguinal hernia   . Productive cough     "DUE TO RADIATION"  . History of nonmelanoma skin cancer   . Constipation     AFTER ANESTHESIA  . Difficulty sleeping   . Radiation 05/11/15-06/21/15    NSCLCA/right lung  . Pneumothorax on right 08/24/2015   Social History   Social History  . Marital Status: Divorced    Spouse Name: N/A  . Number of Children: N/A  . Years of Education: N/A   Occupational History  . engineer    Social History Main Topics  . Smoking status: Current Every Day Smoker -- 0.50 packs/day for 55 years    Types: Cigarettes  . Smokeless tobacco: Never Used  . Alcohol Use: Yes     Comment: every other night -social  . Drug Use: No  . Sexual Activity: Not Asked   Other Topics Concern  . None   Social History Narrative   Family History  Problem Relation Age of Onset  . Heart attack Father   . Hypertension Mother   . Heart disease Father    Scheduled Meds: . amoxicillin-clavulanate  1 tablet Oral Q12H  . B-complex with vitamin C  1 tablet Oral Daily  . budesonide-formoterol  2 puff Inhalation BID  . docusate sodium  100 mg Oral TID  . enoxaparin (LOVENOX) injection  30 mg Subcutaneous QHS  . feeding supplement (ENSURE ENLIVE)  237 mL Oral BID BM  . guaiFENesin  1,200 mg Oral BID  . magic mouthwash  15 mL Oral TID  . saccharomyces boulardii  250 mg Oral BID  . senna  1 tablet Oral BID  . sodium chloride  3 mL Intravenous Q12H  . vitamin C  1,000 mg Oral Daily   Continuous Infusions:  PRN Meds:.albuterol, ALPRAZolam, bisacodyl, HYDROmorphone (DILAUDID) injection, ondansetron  **OR** ondansetron (ZOFRAN) IV, oxyCODONE, polyethylene glycol, traMADol Medications Prior to Admission:  Prior to Admission medications   Medication Sig Start Date End Date Taking? Authorizing Provider  acetaminophen (TYLENOL) 325 MG tablet Take 2 tablets (650 mg total) by mouth every 6 (six) hours as needed for mild pain, moderate pain, fever or headache (or Fever >/= 101). 08/18/15  Yes Modena Jansky, MD  albuterol (PROVENTIL HFA;VENTOLIN HFA) 108 (90 BASE) MCG/ACT inhaler Inhale 1-2 puffs into the lungs every 6 (six) hours as needed for wheezing or shortness of breath. 05/18/15  Yes Wandra Arthurs, MD  ALPRAZolam Duanne Moron) 0.5 MG tablet Take 1 tablet (0.5 mg total) by mouth daily as needed  for anxiety. 08/18/15  Yes Modena Jansky, MD  Ascorbic Acid (VITAMIN C) 1000 MG tablet Take 1,000 mg by mouth daily.   Yes Historical Provider, MD  b complex vitamins tablet Take 1 tablet by mouth daily.   Yes Historical Provider, MD  budesonide-formoterol (SYMBICORT) 160-4.5 MCG/ACT inhaler Inhale 2 puffs into the lungs 2 (two) times daily. 08/18/15  Yes Modena Jansky, MD  CALCIUM CITRATE PO Take 1 tablet by mouth daily.   Yes Historical Provider, MD  Cyanocobalamin (VITAMIN B 12 PO) Take 1 tablet by mouth daily.   Yes Historical Provider, MD  docusate sodium (COLACE) 100 MG capsule Take 100 mg by mouth 3 (three) times daily as needed for mild constipation.    Yes Historical Provider, MD  L-Arginine 1000 MG TABS Take 1,000 mg by mouth daily.   Yes Historical Provider, MD  magnesium 30 MG tablet Take 30 mg by mouth daily.   Yes Historical Provider, MD  Multiple Vitamin (MULTI VITAMIN DAILY PO) Take 1 tablet by mouth daily.   Yes Historical Provider, MD  Omega-3 Fatty Acids (FISH OIL) 1000 MG CAPS Take 1,000 mg by mouth daily.   Yes Historical Provider, MD  oxyCODONE (OXY IR/ROXICODONE) 5 MG immediate release tablet Take 1 tablet (5 mg total) by mouth every 8 (eight) hours as needed for severe pain. 08/18/15   Yes Modena Jansky, MD  Pseudoephedrine-Guaifenesin (MUCINEX D) [phone] MG TB12 Take 1 tablet by mouth 2 (two) times daily.   Yes Historical Provider, MD  vitamin A 10000 UNIT capsule Take 10,000 Units by mouth daily.   Yes Historical Provider, MD   Allergies  Allergen Reactions  . Aleve [Naproxen] Rash    Review of Systems  Constitutional: Positive for activity change, appetite change and fatigue.  Respiratory: Positive for shortness of breath.   Neurological: Positive for weakness.    Physical Exam  Constitutional: He is oriented to person, place, and time. He appears well-developed.  HENT:  Head: Normocephalic and atraumatic.  Cardiovascular: Normal rate.   Respiratory: Effort normal. No accessory muscle usage. No respiratory distress.  GI: Normal appearance. He exhibits no distension.  Neurological: He is alert and oriented to person, place, and time.  Psychiatric:  Flat affect.     Vital Signs: BP 111/60 mmHg  Pulse 84  Temp(Src) 98.3 F (36.8 C) (Oral)  Resp 18  Ht _0  (1.778 m)  Wt 63.05 kg (139 lb)  BMI 19.94 kg/m2  SpO2 96%  SpO2: SpO2: 96 % O2 Device:SpO2: 96 % O2 Flow Rate: .O2 Flow Rate (L/min): 3 L/min  IO: Intake/output summary:  Intake/Output Summary (Last 24 hours) at 09/07/15 1246 Last data filed at 09/07/15 0900  Gross per 24 hour  Intake    240 ml  Output    900 ml  Net   -660 ml    LBM: Last BM Date: 09/07/15 Baseline Weight: Weight: 60.328 kg (133 lb) Most recent weight: Weight: 63.05 kg (139 lb)      Palliative Assessment/Data:  Flowsheet Rows        Most Recent Value   Intake Tab    Referral Department  Surgery   Unit at Time of Referral  Cardiac/Telemetry Unit   Palliative Care Primary Diagnosis  Cancer   Date Notified  09/06/15   Palliative Care Type  New Palliative care   Reason for referral  Clarify Goals of Care   Date of Admission  08/23/15   # of days IP prior to  Palliative referral  14   Clinical Assessment     Psychosocial & Spiritual Assessment    Palliative Care Outcomes       Additional Data Reviewed:  CBC:    Component Value Date/Time   WBC 12.7* 09/06/2015 0515   WBC 10.3 07/25/2015 1039   HGB 10.3* 09/06/2015 0515   HGB 14.7 07/25/2015 1039   HCT 32.1* 09/06/2015 0515   HCT 43.7 07/25/2015 1039   PLT 230 09/06/2015 0515   PLT 298 07/25/2015 1039   MCV 90.7 09/06/2015 0515   MCV 91.6 07/25/2015 1039   NEUTROABS 14.8* 09/03/2015 0310   NEUTROABS 8.3* 07/25/2015 1039   LYMPHSABS 0.8 09/03/2015 0310   LYMPHSABS 0.9 07/25/2015 1039   MONOABS 0.8 09/03/2015 0310   MONOABS 0.9 07/25/2015 1039   EOSABS 0.2 09/03/2015 0310   EOSABS 0.1 07/25/2015 1039   BASOSABS 0.0 09/03/2015 0310   BASOSABS 0.1 07/25/2015 1039   Comprehensive Metabolic Panel:    Component Value Date/Time   NA 134* 09/03/2015 0310   NA 138 07/25/2015 1039   K 3.4* 09/03/2015 0310   K 4.2 07/25/2015 1039   CL 95* 09/03/2015 0310   CO2 30 09/03/2015 0310   CO2 25 07/25/2015 1039   BUN 18 09/03/2015 0310   BUN 19.2 07/25/2015 1039   CREATININE 0.83 09/06/2015 0515   CREATININE 0.9 07/25/2015 1039   GLUCOSE 100* 09/03/2015 0310   GLUCOSE 111 07/25/2015 1039   CALCIUM 8.5* 09/03/2015 0310   CALCIUM 9.7 07/25/2015 1039   AST 28 09/02/2015 0950   AST 18 07/25/2015 1039   ALT 33 09/02/2015 0950   ALT 20 07/25/2015 1039   ALKPHOS 181* 09/02/2015 0950   ALKPHOS 98 07/25/2015 1039   BILITOT 1.2 09/02/2015 0950   BILITOT 0.34 07/25/2015 1039   PROT 5.8* 09/02/2015 0950   PROT 7.4 07/25/2015 1039   ALBUMIN 1.7* 09/02/2015 0950   ALBUMIN 3.8 07/25/2015 1039     Time In: 1145 Time Out: 1255 Time Total: 70MIN Greater than 50%  of this time was spent counseling and coordinating care related to the above assessment and plan.  Signed by: Pershing Proud, NP  Pershing Proud, NP  11/03/4288, 12:46 PM  Please contact Palliative Medicine Team phone at [phone] for questions and concerns.

## 2015-09-07 NOTE — Progress Notes (Signed)
Name: Gabriel [name] MRN: [id number] DOB: Sep 11, 1941    ADMISSION DATE:  08/23/2015 CONSULTATION DATE:  11/1   REFERRING MD :  Dr. Servando Snare   CHIEF COMPLAINT:  PNA   BRIEF PATIENT DESCRIPTION: 74 y/o M, current smoker, with PMH of adenocarcinoma of the lung and recurrent PTX who presented to [facility] on 10/25 with recurrent PTX.  CXR had evolving RLL airspace disease and PCCM consulted for evaluation.   SIGNIFICANT EVENTS  10/25  Admit for recurrent PTX despite chest tube  11/01  PCCM consulted for HCAP   STUDIES: CT Chest W/O (11/1) - dense consolidation within the RML, RLL, RUL c/w multilobar PNA, R chest tube with small apical ptx, small R effusion with interspersed gas, stable pulmonary nodules in L lung  CULTURES: Sputum (11/1):  Oral Flora Blood (10/6):  Negative x2  ANTIBIOTICS: Augmentin 11/8>>  Vancomycin 11/1 - 11/8 Zosyn 11/1 - 11/8  SUBJECTIVE: Dyspnea unchanged with exertion. Cough initially productive of a clear to yellow phlegm. Denies any chest pain or pressure.  REVIEW OF SYSTEMS: Patient denies any diarrhea or abdominal pain. Reports routinely he is constipated. Denies any nausea or vomiting. Denies any subjective fever, chills, or sweats.   VITAL SIGNS: Temp:  [97.8 F (36.6 C)-98.3 F (36.8 C)] 98.3 F (36.8 C) (11/09 0425) Pulse Rate:  [84-94] 84 (11/09 0425) Resp:  [18] 18 (11/09 0425) BP: (111-123)/(56-60) 111/60 mmHg (11/09 0425) SpO2:  [96 %-100 %] 96 % (11/09 0425) Weight:  [63.05 kg (139 lb)] 63.05 kg (139 lb) (11/09 0425)  PHYSICAL EXAMINATION: General:  Awake. Alert. Laying in bed.  Integument:  Warm & dry. Right-sided chest tube in place to waterseal. HEEN:  Moist mucus membranes. No oral ulcers.  Cardiovascular:  Regular rate. No edema.  Pulmonary:  Mild crackles particularly in the right lung base. No accessory muscle use on nasal cannula oxygen. Abdomen: Soft. Normal bowel sounds. Nondistended.    Recent Labs Lab 09/02/15 0950  09/03/15 0310 09/06/15 0515  NA 135 134*  --   K 3.7 3.4*  --   CL 97* 95*  --   CO2 30 30  --   BUN 20 18  --   CREATININE 0.90 0.85 0.83  GLUCOSE 133* 100*  --     Recent Labs Lab 09/02/15 0950 09/03/15 0310 09/06/15 0515  HGB 11.6* 10.1* 10.3*  HCT 34.3* 29.9* 32.1*  WBC 22.1* 16.6* 12.7*  PLT 237 227 230   Dg Chest Port 1 View  09/06/2015  CLINICAL DATA:  Pneumonia.  Chest tube in place EXAM: PORTABLE CHEST 1 VIEW COMPARISON:  September 05, 2015 FINDINGS: Chest tube remains on the right, unchanged in position. No demonstrable pneumothorax. There is widespread airspace consolidation on the right with multiple areas of loculated air, stable. There is evidence of a degree of loculated effusion at the right base. The left lung is mildly hyperexpanded. Interstitial prominence on the left is probably due to redistribution of blood flow to viable lung segments. There is no frank edema or consolidation on the left. Heart is slightly enlarged with pulmonary vascularity stable and within normal limits. There is old rib trauma on the left with healing. There is a skin fold on the left. IMPRESSION: No appreciable change from 1 day prior. Extensive consolidation on the right with multiple areas of loculated air but no convincing pneumothorax. Chest tube position unchanged. Loculated effusion right base. No new opacity. No change in cardiac silhouette. Electronically Signed   By: Gwyndolyn Saxon  Jasmine December III M.D.   On: 09/06/2015 07:26    ASSESSMENT / PLAN:  Unfortunate 74 year old male with acute on chronic hypoxic respiratory failure secondary to healthcare associated pneumonia complicated by a presumed COPD exacerbation and underlying stage III adenocarcinoma the right lung. patient seems to be tolerating transition to oral Augmentin. I did caution him against potential for diarrhea and stomach discomfort on this medication. Reviewing thoracic surgery's note from today. Plan on continuing mini express &  repeating a chest x-ray. Continues to exhibit no signs of acute exacerbation of COPD.   1. Healthcare associated pneumonia: Continuing Augmentin 875 mg by mouth twice a day. Patient is currently on day #9/14 of antibiotics. Repeat CBC in AM 11/10. 2. Acute on chronic hypoxic respiratory failure: Improving with pain control and pulmonary toilette. 3. Right pneumothorax: Status post chest tube placement. Management per thoracic surgery. Chest tube pain control with oxycodone & Dilaudid. 4. Stage III adenocarcinoma the right lung: Management per oncology. 5. Presumed COPD: Continuing on Symbicort 160/4.5. 6. Protein calorie malnutrition: Vitamin C and B complex supplementation. Calorie supplementation with Ensure.  Sonia Baller Ashok Cordia, M.D. [facility] Pager:  [phone] After 3pm or if no response, call [phone]  09/07/2015 10:32 AM

## 2015-09-07 NOTE — Progress Notes (Addendum)
Patient chest tube drainage canister to Mini express as ordered. Dressing at chest tube site was changed and replaced with new dressing.  Pt Chest tube site, red and swollen with drainage and odor. Erin Barrett PAC made aware no new orders received. will continue to monitor patient. Johaan Ryser, Bettina Gavia RN

## 2015-09-07 NOTE — Progress Notes (Addendum)
      [address]       [place], [address]             [phone]      Subjective:  Mr. Kopera states he was told he was going to be discharged today.  He states he does not feel ready for this.  I told the patient he was misinformed and there are no plans to discharge him today.  Overall, he feels he is just not getting better  Objective: Vital signs in last 24 hours: Temp:  [97.8 F (36.6 C)-98.3 F (36.8 C)] 98.3 F (36.8 C) (11/09 0425) Pulse Rate:  [84-94] 84 (11/09 0425) Cardiac Rhythm:  [-]  Resp:  [18] 18 (11/09 0425) BP: (111-123)/(56-60) 111/60 mmHg (11/09 0425) SpO2:  [95 %-100 %] 96 % (11/09 0425) Weight:  [139 lb (63.05 kg)] 139 lb (63.05 kg) (11/09 0425)  Intake/Output from previous day: 11/08 0701 - 11/09 0700 In: 600 [P.O.:600] Out: 1510 [Urine:1500; Chest Tube:10]  General appearance: alert, cooperative and no distress Heart: regular rate and rhythm Lungs: clear to auscultation bilaterally Abdomen: soft, non-tender; bowel sounds normal; no masses,  no organomegaly Wound: clean and dry  Lab Results:  Recent Labs  09/06/15 0515  WBC 12.7*  HGB 10.3*  HCT 32.1*  PLT 230   BMET:  Recent Labs  09/06/15 0515  CREATININE 0.83    PT/INR: No results for input(s): LABPROT, INR in the last 72 hours. ABG    Component Value Date/Time   TCO2 22 04/04/2015 1839   CBG (last 3)  No results for input(s): GLUCAP in the last 72 hours.  Assessment/Plan:  1.Chest tube- no air leak appreciated, CXR has been stable, however not ordered for today-- will place chest tube to Mini Express today, will repeat CXR 2. Pulm- weaning oxygen as tolerated 3. Pneumonia- on Augmentin, will monitor to ensure patient doesn't develop fever 4. Dispo- patient is stable, awaiting Palliative care consult, will convert chest tube to Mini Express- repeat CXR after conversion and in AM   LOS: 15 days    BARRETT, ERIN 09/07/2015  patient examined and  medical record reviewed,agree with above note. Tharon Aquas Trigt III 09/07/2015

## 2015-09-07 NOTE — Progress Notes (Signed)
Nutrition Follow Up  DOCUMENTATION CODES:   Severe malnutrition in context of chronic illness  INTERVENTION:    Continue Ensure Enlive po BID, each supplement provides 350 kcal and 20 grams of protein  NUTRITION DIAGNOSIS:   Malnutrition related to cancer and cancer related treatments as evidenced by moderate depletion of body fat, severe depletion of muscle mass, ongoing  GOAL:   Patient will meet greater than or equal to 90% of their needs, progressing  MONITOR:   PO intake, Supplement acceptance, Labs, Weight trends, I & O's  ASSESSMENT:   74 yo Male with stage III non-small cell carcinoma of right lung (T2a, N2, M0), undergoing chemoradiation therapy at the cancer center undergoing treatment with carboplatin and paclitaxel having partial response. He was last seen by Dr. Julien Nordmann on 07/25/2015 with treatment options were discussed at the time, he expects to start treatment with carboplatin and gemcitabine on 08/08/2015. Mr. Loescher also has a history of a right-sided pneumothorax diagnosed on 04/04/2015 .  Patient continues on a Regular diet.  PO intake 50% per flowsheet records.  Malnutrition ongoing.  Receiving Ensure Enlive oral nutrition supplements.  Cardiothoracic Surgery note reviewed.  Plan to convert chest tube to Mini Express; repeat CXR in AM.  Diet Order:  Diet regular Room service appropriate?: Yes; Fluid consistency:: Thin  Skin:  Wound (see comment) (Sutures to chest.)  Last BM:  11/9  Height:   Ht Readings from Last 1 Encounters:  08/23/15 '5\' 10"'$  (1.778 m)    Weight:   Wt Readings from Last 1 Encounters:  09/07/15 139 lb (63.05 kg)    Ideal Body Weight:  75.45 kg  BMI:  Body mass index is 19.94 kg/(m^2).  Estimated Nutritional Needs:   Kcal:  2100-2400 calories  Protein:  76-100 grams  Fluid:  >/= 2.1L  EDUCATION NEEDS:   No education needs identified at this time  Arthur Holms, RD, LDN Pager #: [phone] After-Hours Pager #:  [phone]

## 2015-09-08 ENCOUNTER — Inpatient Hospital Stay (HOSPITAL_COMMUNITY): Payer: 59

## 2015-09-08 LAB — CBC WITH DIFFERENTIAL/PLATELET
BASOS ABS: 0 10*3/uL (ref 0.0–0.1)
BASOS PCT: 0 %
Eosinophils Absolute: 0.1 10*3/uL (ref 0.0–0.7)
Eosinophils Relative: 1 %
HEMATOCRIT: 28.5 % — AB (ref 39.0–52.0)
Hemoglobin: 9.5 g/dL — ABNORMAL LOW (ref 13.0–17.0)
LYMPHS PCT: 7 %
Lymphs Abs: 0.8 10*3/uL (ref 0.7–4.0)
MCH: 30.2 pg (ref 26.0–34.0)
MCHC: 33.3 g/dL (ref 30.0–36.0)
MCV: 90.5 fL (ref 78.0–100.0)
MONO ABS: 0.7 10*3/uL (ref 0.1–1.0)
Monocytes Relative: 6 %
NEUTROS ABS: 10.2 10*3/uL — AB (ref 1.7–7.7)
NEUTROS PCT: 86 %
PLATELETS: 227 10*3/uL (ref 150–400)
RBC: 3.15 MIL/uL — ABNORMAL LOW (ref 4.22–5.81)
RDW: 15.5 % (ref 11.5–15.5)
WBC: 11.8 10*3/uL — ABNORMAL HIGH (ref 4.0–10.5)

## 2015-09-08 MED ORDER — LOPERAMIDE HCL 2 MG PO CAPS
2.0000 mg | ORAL_CAPSULE | Freq: Once | ORAL | Status: AC
  Administered 2015-09-08: 2 mg via ORAL
  Filled 2015-09-08: qty 1

## 2015-09-08 MED ORDER — LOPERAMIDE HCL 2 MG PO CAPS
2.0000 mg | ORAL_CAPSULE | ORAL | Status: DC | PRN
  Administered 2015-09-09: 2 mg via ORAL
  Filled 2015-09-08 (×2): qty 1

## 2015-09-08 NOTE — Progress Notes (Signed)
UR Completed. Asheton Viramontes, RN, BSN.  [phone] 

## 2015-09-08 NOTE — Progress Notes (Addendum)
Daily Progress Note   Patient Name: Gabriel [name]       Date: 09/08/2015 DOB: 1941/09/05  Age: 74 y.o. MRN#: [id number] Attending Physician: Grace Isaac, MD Primary Care Physician: Cammy Copa, MD Admit Date: 08/23/2015  Reason for Consultation/Follow-up: Establishing goals of care  Subjective: Mr. Palos is sitting up on side of bed eating lunch. Daughter is at bedside and son enters as well. He complains of C. diff precautions. He says that he does not feel like he is really improving much although seems to have a little more energy and is working up his tolerance walking/activity. Maintains his goal to return home and continue with plans for chemotherapy. Would like to encourage him to consider completing Advance Directives. Will f/u on Monday.   Length of Stay: 16 days  Current Medications: Scheduled Meds:  . amoxicillin-clavulanate  1 tablet Oral Q12H  . B-complex with vitamin C  1 tablet Oral Daily  . budesonide-formoterol  2 puff Inhalation BID  . docusate sodium  100 mg Oral TID  . enoxaparin (LOVENOX) injection  30 mg Subcutaneous QHS  . feeding supplement (ENSURE ENLIVE)  237 mL Oral BID BM  . guaiFENesin  1,200 mg Oral BID  . magic mouthwash  15 mL Oral TID  . saccharomyces boulardii  250 mg Oral BID  . senna  1 tablet Oral BID  . sodium chloride  3 mL Intravenous Q12H  . vitamin C  1,000 mg Oral Daily    Continuous Infusions:    PRN Meds: albuterol, ALPRAZolam, bisacodyl, HYDROmorphone (DILAUDID) injection, ondansetron **OR** ondansetron (ZOFRAN) IV, oxyCODONE, polyethylene glycol, traMADol  Physical Exam: Physical Exam  Constitutional: He is oriented to person, place, and time. He appears well-developed. No distress.  HENT:  Head:  Normocephalic.  Pulmonary/Chest: Effort normal.  Neurological: He is alert and oriented to person, place, and time.                Vital Signs: BP 116/59 mmHg  Pulse 99  Temp(Src) 99.1 F (37.3 C) (Oral)  Resp 19  Ht 5' 10"  (1.778 m)  Wt 63.8 kg (140 lb 10.5 oz)  BMI 20.18 kg/m2  SpO2 96% SpO2: SpO2: 96 % O2 Device: O2 Device: Nasal Cannula O2 Flow Rate: O2 Flow Rate (L/min): 2.5 L/min  Intake/output summary:  Intake/Output  Summary (Last 24 hours) at 09/08/15 1738 Last data filed at 09/08/15 0533  Gross per 24 hour  Intake      0 ml  Output    200 ml  Net   -200 ml   LBM: Last BM Date: 09/08/15 Baseline Weight: Weight: 60.328 kg (133 lb) Most recent weight: Weight: 63.8 kg (140 lb 10.5 oz)       Palliative Assessment/Data: Flowsheet Rows        Most Recent Value   Intake Tab    Referral Department  Surgery   Unit at Time of Referral  Cardiac/Telemetry Unit   Palliative Care Primary Diagnosis  Cancer   Date Notified  09/06/15   Palliative Care Type  New Palliative care   Reason for referral  Clarify Goals of Care   Date of Admission  08/23/15   # of days IP prior to Palliative referral  14   Clinical Assessment    Psychosocial & Spiritual Assessment    Palliative Care Outcomes       Additional Data Reviewed: CBC    Component Value Date/Time   WBC 11.8* 09/08/2015 0314   WBC 10.3 07/25/2015 1039   RBC 3.15* 09/08/2015 0314   RBC 4.77 07/25/2015 1039   HGB 9.5* 09/08/2015 0314   HGB 14.7 07/25/2015 1039   HCT 28.5* 09/08/2015 0314   HCT 43.7 07/25/2015 1039   PLT 227 09/08/2015 0314   PLT 298 07/25/2015 1039   MCV 90.5 09/08/2015 0314   MCV 91.6 07/25/2015 1039   MCH 30.2 09/08/2015 0314   MCH 30.9 07/25/2015 1039   MCHC 33.3 09/08/2015 0314   MCHC 33.7 07/25/2015 1039   RDW 15.5 09/08/2015 0314   RDW 18.1* 07/25/2015 1039   LYMPHSABS 0.8 09/08/2015 0314   LYMPHSABS 0.9 07/25/2015 1039   MONOABS 0.7 09/08/2015 0314   MONOABS 0.9 07/25/2015 1039     EOSABS 0.1 09/08/2015 0314   EOSABS 0.1 07/25/2015 1039   BASOSABS 0.0 09/08/2015 0314   BASOSABS 0.1 07/25/2015 1039    CMP     Component Value Date/Time   NA 134* 09/03/2015 0310   NA 138 07/25/2015 1039   K 3.4* 09/03/2015 0310   K 4.2 07/25/2015 1039   CL 95* 09/03/2015 0310   CO2 30 09/03/2015 0310   CO2 25 07/25/2015 1039   GLUCOSE 100* 09/03/2015 0310   GLUCOSE 111 07/25/2015 1039   BUN 18 09/03/2015 0310   BUN 19.2 07/25/2015 1039   CREATININE 0.83 09/06/2015 0515   CREATININE 0.9 07/25/2015 1039   CALCIUM 8.5* 09/03/2015 0310   CALCIUM 9.7 07/25/2015 1039   PROT 5.8* 09/02/2015 0950   PROT 7.4 07/25/2015 1039   ALBUMIN 1.7* 09/02/2015 0950   ALBUMIN 3.8 07/25/2015 1039   AST 28 09/02/2015 0950   AST 18 07/25/2015 1039   ALT 33 09/02/2015 0950   ALT 20 07/25/2015 1039   ALKPHOS 181* 09/02/2015 0950   ALKPHOS 98 07/25/2015 1039   BILITOT 1.2 09/02/2015 0950   BILITOT 0.34 07/25/2015 1039   GFRNONAA >60 09/06/2015 0515   GFRAA >60 09/06/2015 0515       Problem List:  Patient Active Problem List   Diagnosis Date Noted  . Palliative care encounter 09/07/2015  . Weakness generalized   . Right-sided chest pain 08/31/2015  . Pneumonia   . Pneumothorax 08/23/2015  . Pneumothorax, right   . Chest tube in place   . HCAP (healthcare-associated pneumonia) 08/04/2015  . COPD exacerbation ([place]) 08/04/2015  .  COPD (chronic obstructive pulmonary disease) ([place]) 07/28/2015  . Encounter for antineoplastic chemotherapy 05/09/2015  . Non-small cell carcinoma of right lung, stage 3 ([place]) 04/21/2015  . Hemoptysis, unspecified 10/07/2013     Palliative Care Assessment & Plan   2 1.Code Status:  Full code    Code Status Orders        Start     Ordered   08/23/15 1805  Full code   Continuous     08/23/15 1807       2. Goals of Care/Additional Recommendations:  Gain strength enough to return home and continue chemotherapy treatment.   Desire for  further Chaplaincy support:no  Psycho-social Needs: Caregiving  Support/Resources  3. Symptom Management:  Pain: Oxycodone 5 mg every 4 hours prn.   Anxiety: Xanax 0.5 mg TID prn.   SOB: Continue to manage CT per CVTS.   Weakness: Consider PT consult. Optimize nutrition.  Malnutrition: Discussed important of nutrition with small frequent meals/snacks. Supplement between meals to optimize caloric energy to work towards his goals.  4. Palliative Prophylaxis:   Bowel Regimen, Delirium Protocol and Frequent Pain Assessment  5. Prognosis: Unable to determine  6. Discharge Planning:  Home with Home Health - discussed services. Met with [facility] and they say they are providing 24/7 nursing triage. Family interested if [facility] offers this as well. His main concern is that he will return home and not have what he needs and be a disaster.    Thank you for allowing the Palliative Medicine Team to assist in the care of this patient.   Time In: 8412 Time Out: 1300 Total Time 39mn Prolonged Time Billed  no         APershing Proud NP  182/05/1387 5:38 PM  Please contact Palliative Medicine Team phone at [phone] questions and concerns.

## 2015-09-08 NOTE — Progress Notes (Addendum)
      [address]       [place] [address]             [phone]            Subjective: He just returned from x ray. No specific complaints this am.  Objective: Vital signs in last 24 hours: Temp:  [97.9 F (36.6 C)-98.7 F (37.1 C)] 98.5 F (36.9 C) (11/10 0527) Pulse Rate:  [86-91] 86 (11/10 0527) Cardiac Rhythm:  [-]  Resp:  [18] 18 (11/09 2216) BP: (111-139)/(59-67) 114/59 mmHg (11/10 0527) SpO2:  [94 %-99 %] 97 % (11/10 0527) Weight:  [140 lb 10.5 oz (63.8 kg)] 140 lb 10.5 oz (63.8 kg) (11/10 0527)     Intake/Output from previous day: 11/09 0701 - 11/10 0700 In: 960 [P.O.:960] Out: 1751 [Urine:1750; Stool:1]   Physical Exam:  Cardiovascular: RRR Pulmonary: Clear to auscultation bilaterally; no rales, wheezes, or rhonchi. Abdomen: Soft, non tender, bowel sounds present. Extremities: No  lower extremity edema. Wound: Dressing is clean and dry.   Chest Tube: to mini express  Lab Results: CBC: Recent Labs  09/06/15 0515 09/08/15 0314  WBC 12.7* 11.8*  HGB 10.3* 9.5*  HCT 32.1* 28.5*  PLT 230 227   BMET:  Recent Labs  09/06/15 0515  CREATININE 0.83    PT/INR: No results for input(s): LABPROT, INR in the last 72 hours. ABG:  INR: Will add last result for INR, ABG once components are confirmed Will add last 4 CBG results once components are confirmed  Assessment/Plan:  1. CV - SR in the 80-90's. 2.  Pulmonary - On 3 liters of oxygen. Chest tube is to a mini express. CXR this am appears stable (extensive consolidation on the right). 3. ID-On Augmentin. 4. Anemia-H and H stable 9.5 and 28.5 5. Regarding disposition, will consider home on oral antibiotics and chest tube to mini express. Possibly 1-2 days.  ZIMMERMAN,DONIELLE MPA-C 09/08/2015,7:33 AM  CXR is stable, pulmonary status stable on water seal Scant chest tube drainage He could safely go home soon with HHN, home O2, miniexpress and po antibiotics - Augmentin. He  needs to be stronger and tolerate ambulation > 150 ft F/U with EBG in a week w/ CXR  patient examined and medical record reviewed,agree with above note. Tharon Aquas Trigt III 09/08/2015

## 2015-09-08 NOTE — Discharge Summary (Signed)
Physician Discharge Summary       [address]       ,[place] [address]             [phone]    Patient ID: Gabriel [name] MRN: [id number] DOB/AGE: 12/29/1940 74 y.o.  Admit date: 08/23/2015 Discharge date: 09/14/2015  Admission Diagnoses: 1. Right recurrent spontaneous pneumothorax 2. Non-small cell carcinoma of right lung, stage 3   Active Diagnoses:  1. Right recurrent spontaneous pneumothorax 2. Non-small cell carcinoma of right lung, stage 3 ([place]) 3. History of tobacco abuse 4. HCAP  5. Weakness, generalized 6. History of skin cancer (non melanoma) 7. History of radiation and chemotherapy   Consults: pulmonary/intensive care and Palliative care  Procedure (s):  1. 28 French tube was placed in the right lateral 6 rib space. 2. Exchange and reposition of right chest tube. A 61F Thall chest tube was ultimately positioned in the apex of the right pleural space by IR on 08/24/2015.  History of Presenting Illness: This is a 74 year old Caucasian male with a past medical history of stage III non-small cell carcinoma of right lung (T2a, N2, M0), undergoing chemoradiation therapy at the cancer center undergoing treatment with carboplatin and paclitaxel having partial response. He was last seen by Dr. Julien Nordmann on 07/25/2015 with treatment options were discussed at the time, he expects to start treatment with carboplatin and gemcitabine on 08/08/2015. Gabriel [name] also has a history of a right-sided pneumothorax diagnosed on 04/04/2015 undergoing chest tube placement. He presented to the emergency room on previous admission with complaints of severe dyspnea on exertion. Chest tube placed, he had prolonged air leak and was d/c home with chest tube. Attempt at IBV valves did not help. He had increased shortness of breath and decreased oxygen saturation. Chest xray in office on 08/23/2015 showed incewased pneumothorax. He was admitted to [facility]  and chest tube was  put back on suction.    Brief Hospital Course:  Patient had a persistent air leak. Daily chest x rays were obtained. The chest tube appearsd to be obstructed with fibrinous debris. Chest tube was stripped with increased bubbles. A new chest tube was placed. IR was consulted and then placed a 20 French Thall chest tube. Anterior chest tube was removed on 10/27 and chest tube put in by IR was retracted back a little as the tip was curled on itself. Chest x rays remained stable. Chest tube was placed to water seal. He developed decreased oxygenation, increased oxygen requirements, productive sputum, and leukocytosis. He developed HCAP. He was put on Zosyn. A consult was obtained with pulmonary/CCM. Vancomycin was added. Sputum culture showed rare gram negative rods. WBC gradually decreased and he was transitioned from IV antibiotics to oral Augmentin. Air leak mostly resolved. He was placed back to a mini express. Chest x rays remained stable. Palliative care consult was obtained to help establish goals of care. Patient states he will continue treatment and try to restore his functional status, although he understands there are many obstacles to overcome. There has been been purulent like drainage from around chest tube as well as "murky" drainage from chest tube. Per Dr. Chase Caller, he is to have 3 total weeks of IV/oral antibiotics. He was restarted on Augmentin today. He has remained afebrile and his last WBC was down to 11,300. Chest x ray has remained stable. Chest tube will remain to mini express. Per Dr. Servando Snare, he is felt surgically stable for discharge today.  Latest  Vital Signs: Blood pressure 112/56, pulse 90, temperature 98.4 F (36.9 C), temperature source Oral, resp. rate 20, height '5\' 10"'$  (1.778 m), weight 142 lb 13.7 oz (64.8 kg), SpO2 95 %.  Physical Exam: Cardiovascular: RRR Pulmonary: Clear to auscultation bilaterally; no rales, wheezes, or  rhonchi. Abdomen: Soft, non tender, bowel sounds present. Extremities: ++ lower xtremity edema. Wound: Dressing is clean and dry.  Chest Tube: to mini express  Discharge Condition:Stable and discharged to home.  Recent laboratory studies:  Lab Results  Component Value Date   WBC 11.3* 09/12/2015   HGB 8.9* 09/12/2015   HCT 27.8* 09/12/2015   MCV 90.0 09/12/2015   PLT 300 09/12/2015   Lab Results  Component Value Date   NA 134* 09/12/2015   K 3.8 09/12/2015   CL 102 09/12/2015   CO2 26 09/12/2015   CREATININE 0.79 09/13/2015   GLUCOSE 97 09/12/2015    Diagnostic Studies: Dg Chest 2 View EXAM: PORTABLE CHEST 1 VIEW  COMPARISON: 09/13/2015; 09/12/2015; 09/10/2015 ; 08/30/2015; chest CT - 08/30/2015  FINDINGS: There is persistent obscuration of the right heart border and right-sided mediastinal contours secondary to grossly unchanged extensive heterogeneous airspace opacities and suspected small to moderate-sized likely loculated right-sided pleural effusion. Stable position of support apparatus. No pneumothorax. Left lung remains normally aerated. No acute osseus abnormalities.  IMPRESSION: 1. Stable positioning of support apparatus. No pneumothorax. 2. Grossly unchanged findings of extensive presumed multi focal infection involving the right lung with associated small to moderate likely loculated right-sided effusion.   Electronically Signed  By: Sandi Mariscal M.D.  On: 09/14/2015 07:43   Ct Chest Wo Contrast  08/30/2015  CLINICAL DATA:  Respiratory failure, chest tube placement EXAM: CT CHEST WITHOUT CONTRAST TECHNIQUE: Multidetector CT imaging of the chest was performed following the standard protocol without IV contrast. COMPARISON:  Chest radiograph 08/30/2015, 08/29/2015, CT 07/22/2015 FINDINGS: Mediastinum/Nodes: No axillary or supraclavicular lymphadenopathy. No mediastinal hilar adenopathy. No pericardial effusion. Esophagus normal. Trachea is large  measuring 2.5 cm in diameter above the carina Lungs/Pleura: Chest tube extends anteriorly to the RIGHT lung apex. There is bullous change at the apex of the small RIGHT apical pneumothorax. There is dense consolidation within the RIGHT middle lobe and RIGHT lower lobe are to lesser degree the RIGHT upper lobe. There is air bronchograms present. Much of the consolidation is dense without air bronchograms. Underlying centrilobular emphysema. There is a small RIGHT effusion with a small locules of gas interspersed. Overall there is volume loss the RIGHT hemi thorax. The LEFT lung is expanded. There is centrilobular emphysema. Thin-walled cavitary lesion in the superior segment of the LEFT lower lobe measuring 2 .2 cm on (image 33, series 405). Small spiculated nodule in the RIGHT upper lobe LEFT upper lobe measures 8 mm on image 19. Most inferior LEFT lower lobe irregular nodule or diaphragm measures 12 mm on image 54. CT comparison from 07/22/2015 demonstrates the short-term stability of the LEFT lung lesions these dense RIGHT consolidation is new. Upper abdomen: Limited view of the liver, kidneys, pancreas are unremarkable. Normal adrenal glands. Musculoskeletal: No aggressive osseous lesion. IMPRESSION: 1. Dense consolidation within the RIGHT middle and lower lobe and to a lesser degree the RIGHT upper lobe most consistent with MULTILOBAR PNEUMONIA. 2. RIGHT chest tube in place with small apical pneumothorax. Small RIGHT effusion with interspersed gas. 3. Stable pulmonary nodules in the LEFT lung compared to CT 07/22/2015. Recommend followup. Electronically Signed   By: Suzy Bouchard M.D.   On: 08/30/2015 13:55  Ir Catheter Tube Change  08/24/2015  CLINICAL DATA:  74 year old male with recurrent right-sided pneumothorax. A right-sided chest tube was recently placed, however the tube is basilar in position. Request is made for repositioning of chest tube into the apical portion of the lung to facilitate  evacuation of the pneumothorax. EXAM: IR CATHETER TUBE CHANGE Date: 08/24/2015 PROCEDURE: 1. Manipulation and ultimate exchange of existing chest tube Interventional Radiologist:  Criselda Peaches, MD ANESTHESIA/SEDATION: 25 mcg fentanyl administered intravenously MEDICATIONS: None additional FLUOROSCOPY TIME:  6 minutes 30 seconds 125 mGy CONTRAST:  None TECHNIQUE: Informed consent was obtained from the patient following explanation of the procedure, risks, benefits and alternatives. The patient understands, agrees and consents for the procedure. All questions were addressed. A time out was performed. Maximal barrier sterile technique utilized including caps, mask, sterile gowns, sterile gloves, large sterile drape, hand hygiene, and Betadine skin prep. The existing retaining suture was cut. Under fluoroscopic guidance, the existing right-sided chest tube was manipulated but could not be directed superiorly. Therefore, a stiff Amplatz wire was advanced through the tube in an effort to directives superiorly. This was unsuccessful. Next, an angled catheter was advanced coaxially through the tube and attempts were made to direct the wire superiorly. Again, this was unsuccessful. The tube appears to be entrapped by scar tissue at the lung base. An 18 gauge needle was used through the existing incision and adjacent to the existing chest tube. Under real-time fluoroscopy, a puncture was made through the pleura and a more cephalad direction and into the large pneumothorax. The wire was advanced through the 18 gauge needle and coiled in the thoracic apex. The existing chest tube was removed. The new tract was serially dilated and ultimately a 20 French fall drainage catheter was advanced over the wire and positioned in the apex of the lung. The drainage catheter was connected to low wall suction with immediate reinflation of the lung. An image was obtained to document the tube position an the evacuation of the  pneumothorax. The tube was then secured with 0 silk suture and a water tight sterile bandage. COMPLICATIONS: None IMPRESSION: Successful exchange of the existing basilar directed chest tube for a new 20 French fall drainage catheter which is directed into the right thoracic apex. Successful reinflation of the right lung. Electronically Signed   By: Wilfred Lacy  Discharge Medications:   Medication List    STOP taking these medications        docusate sodium 100 MG capsule  Commonly known as:  COLACE     MUCINEX D [phone] MG Tb12  Generic drug:  Pseudoephedrine-Guaifenesin      TAKE these medications        acetaminophen 325 MG tablet  Commonly known as:  TYLENOL  Take 2 tablets (650 mg total) by mouth every 6 (six) hours as needed for mild pain, moderate pain, fever or headache (or Fever >/= 101).     albuterol 108 (90 BASE) MCG/ACT inhaler  Commonly known as:  PROVENTIL HFA;VENTOLIN HFA  Inhale 1-2 puffs into the lungs every 6 (six) hours as needed for wheezing or shortness of breath.     ALPRAZolam 0.5 MG tablet  Commonly known as:  XANAX  Take 1 tablet (0.5 mg total) by mouth 3 (three) times daily as needed for anxiety.     amoxicillin-clavulanate 875-125 MG tablet  Commonly known as:  AUGMENTIN  Take 1 tablet by mouth every 12 (twelve) hours.     b complex vitamins tablet  Take 1 tablet by mouth daily.     budesonide-formoterol 160-4.5 MCG/ACT inhaler  Commonly known as:  SYMBICORT  Inhale 2 puffs into the lungs 2 (two) times daily.     CALCIUM CITRATE PO  Take 1 tablet by mouth daily.     Fish Oil 1000 MG Caps  Take 1,000 mg by mouth daily.     guaiFENesin 600 MG 12 hr tablet  Commonly known as:  MUCINEX  Take 1 tablet (600 mg total) by mouth 2 (two) times daily as needed.     L-Arginine 1000 MG Tabs  Take 1,000 mg by mouth daily.     magnesium 30 MG tablet  Take 30 mg by mouth daily.     MULTI VITAMIN DAILY PO  Take 1 tablet by mouth daily.     oxyCODONE 5 MG  immediate release tablet  Commonly known as:  Oxy IR/ROXICODONE  Take 1 tablet (5 mg total) by mouth every 6 (six) hours as needed for severe pain.     vitamin A 10000 UNIT capsule  Take 10,000 Units by mouth daily.     VITAMIN B 12 PO  Take 1 tablet by mouth daily.     vitamin C 1000 MG tablet  Take 1,000 mg by mouth daily.        Follow Up Appointments: Follow-up Information    Follow up with Grace Isaac, MD On 09/20/2015.   Specialty:  Cardiothoracic Surgery   Why:  PA/LA CXR to be taken (at [facility] which is in the same building as Dr. Everrett Coombe office on at 09/20/2015 at 2:45 pm;Appointment time is at 3:30 pm   Contact information:   [place] [address] [phone]       Follow up with Eilleen Kempf., MD On 09/19/2015.   Specialty:  Oncology   Why:  Appointment time is at 1:45 pm   Contact information:   [place] [address] [phone]       Follow up with [facility].   Why:  Home Health RN   Contact information:   [address] [place] [address] [phone]       Signed: Lars Pinks MPA-C 09/14/2015, 9:57 AM

## 2015-09-08 NOTE — Care Management Note (Signed)
Case Management Note [name] RN, BSN Unit 2W-Case Manager [phone]  Patient Details  Name: Gabriel [name] MRN: [id number] Date of Birth: 1941-04-02  Subjective/Objective:    Pt admitted with increase in Pntx, persistent airleak                Action/Plan: Update 09/08/2015 Pending discharge plan:  Pt will discharge home on oral antibiotics, mini express and possibly home 02; bedside nurse actively attempting to wean pt from oxygen; will perform ambulation test prior to discharge.  CM informed [facility] of pending referral for home oxygen.  [facility] is aware of pt admit and pending referral for [facility] resumption orders for chest tube care. [name], RN, BSN [phone]  09/01/15 PTA pt lived at home- was active with [facility] for [place] - with chest tube in place at discharge to mini express- pt will need resumption orders for discharge- if he returns home again with chest tube in place- NCM to follow for d/c needs [name]   Expected Discharge Date:                  Expected Discharge Plan:  [place]  In-House Referral:     Discharge planning Services  CM Consult  Post Acute Care Choice:  Resumption of Svcs/PTA Provider Choice offered to:     DME Arranged:    DME Agency:     HH Arranged:   RN [place] Agency:  [place]  Status of Service:  In process, will continue to follow  Medicare Important Message Given:  Yes-second notification given Date Medicare IM Given:    Medicare IM give by:    Date Additional Medicare IM Given:    Additional Medicare Important Message give by:     If discussed at [place] of Stay Meetings, dates discussed:  08/30/15, 09/01/15,09/06/15, 09/08/15  Additional Comments: 09/08/2015 Pt CT changed to mini express 08/07/15.  Pt remains on 3 L [place]  09/01/15 - [name] RN, BSN - pt remains with CT- waterseal- also with RLL airspace dx- on IV abx- continues to need 5L of 02- may need home 02 arranged at  discharge- will need to have qualifying ambulation documentation done to qualify for insurance- was active with [facility] for [place] and if needed will need resumption orders- NCM to continue to follow for d/c needs  [name], RN 09/08/2015, 11:31 AM

## 2015-09-08 NOTE — Discharge Instructions (Signed)
Chest Tube, Care After Refer to this sheet in the next few weeks. These instructions provide you with information on caring for yourself after your procedure. Your health care provider may also give you more specific instructions. Your treatment has been planned according to current medical practices, but problems sometimes occur. Call your health care provider if you have any problems or questions after your procedure.  WHAT TO EXPECT AFTER THE PROCEDURE After the procedure, you will have a thin tube that passes through the skin between your ribs and into your chest. It is normal to have some pain or discomfort at the site of the chest tube insertion.  HOME CARE INSTRUCTIONS If you go home with a chest tube still in place, follow these instructions:   Be careful to avoid any activity that may cause your chest tube to become dislodged.  Check the tubing often to make sure it is not kinked. When the tubing is kinked, draining will not take place properly, and you may have some trouble breathing.   Take these steps if the chest tube falls out:   Do not panic.   Open a package of gauze coated with petroleum jelly.   Open a package of dry, square gauze.   Cover the wound first with the petroleum jelly gauze and then cover that with the dry, square gauze.   Tape the dry, square gauze in place.   After you have covered the site, call your health care provider for instructions. Your health care provider will decide if you need to go to the emergency department.   You may shower with the chest tube in place if your health care provider approves. Cover the area where the chest tube comes out with a small plastic bag and tape it well. Bring the drainage device to the shower area and leave it outside the shower curtain or door. The unit should never be under the direct flow of water.   Perform gentle exercise such as walking as directed by your health care provider. Talk to your health care  provider about any other activity or exercise you want to do.   Only take over-the-counter or prescription medicines as directed by your health care provider.   Follow up with your health care provider as directed. SEEK MEDICAL CARE IF:  You have redness or swelling at the incision.   Your incision has opened (the edges are not staying together).  You have drainage from the incision area.   Your drainage from the chest tube changes color or becomes red or bloody.   Your pain is not controlled with the medicines prescribed.  SEEK IMMEDIATE MEDICAL CARE IF:  You have a fever.   You have any new trouble with breathing.   You develop any chest pain.   You develop unusual sweating.  You have weakness.  You feel lightheaded, or you faint.  Your connecting tubes become disconnected.  You develop red streaking of the skin that extends above or below the incision.  Your chest tube falls out.    This information is not intended to replace advice given to you by your health care provider. Make sure you discuss any questions you have with your health care provider.   Document Released: 08/05/2013 Document Reviewed: 08/05/2013 Elsevier Interactive Patient Education [employer].

## 2015-09-09 ENCOUNTER — Inpatient Hospital Stay (HOSPITAL_COMMUNITY): Payer: 59

## 2015-09-09 DIAGNOSIS — Z789 Other specified health status: Secondary | ICD-10-CM

## 2015-09-09 MED ORDER — LIDOCAINE HCL (PF) 1 % IJ SOLN
INTRAMUSCULAR | Status: AC
  Administered 2015-09-09: 1 mL via SUBCUTANEOUS
  Filled 2015-09-09: qty 5

## 2015-09-09 NOTE — Evaluation (Signed)
Physical Therapy Evaluation Patient Details Name: Gabriel [name] MRN: [id number] DOB: 1941-04-27 Today's Date: 09/09/2015   History of Present Illness  Patient is a 74 y/o male with hx of adenocarcinoma of the lung, COPD and recurrent PTX who presented to [facility] on 10/25 with recurrent PTX. CXR had evolving RLL airspace disease, right pleural effusion. S/p chest tube placement.  Clinical Impression  Patient presents with functional limitations due to deficits listed in PT problem list (see below). Pt with impaired cardiovascular endurance and drop in Sp02 with mobility to 75% on RA. Lengthy discussion re: activity levels, energy conservation techniques and utilization of oxygen during activity. Pt eager to return to independence. Encouraged ambulation daily with RN. Will follow acutely to maximize independence and mobility prior to return home.    Follow Up Recommendations Supervision - Intermittent;No PT follow up (possibly pulmonary rehab once cleared by MD)    Equipment Recommendations  None recommended by PT    Recommendations for Other Services       Precautions / Restrictions Precautions Precautions: Fall Precaution Comments: watch 02 sats, 1 chest tube to water seal Restrictions Weight Bearing Restrictions: No      Mobility  Bed Mobility               General bed mobility comments: Sitting EOB upon PT arrival.   Transfers Overall transfer level: Needs assistance Equipment used: None Transfers: Sit to/from Stand Sit to Stand: Supervision         General transfer comment: Supervision for safety.   Ambulation/Gait Ambulation/Gait assistance: Supervision Ambulation Distance (Feet): 200 Feet Assistive device: None Gait Pattern/deviations: Step-through pattern;Decreased stride length Gait velocity: very slow Gait velocity interpretation: <1.8 ft/sec, indicative of risk for recurrent falls General Gait Details: Slow, guarded gait. Sp02 decreased to 76% on RA.  Sp02 remained low 80s on 1L/min 02, increased to 2L/min and Sp02 stayed >92%. Dyspnea present. 2 standing rest breaks. CUes for pursed lip breathing. Asymptomatic.  Stairs            Wheelchair Mobility    Modified Rankin (Stroke Patients Only)       Balance Overall balance assessment: Needs assistance Sitting-balance support: Feet supported;No upper extremity supported Sitting balance-Leahy Scale: Good     Standing balance support: During functional activity Standing balance-Leahy Scale: Fair                               Pertinent Vitals/Pain Pain Assessment: No/denies pain    Home Living Family/patient expects to be discharged to:: Private residence Living Arrangements: Alone Available Help at Discharge: Family;Available PRN/intermittently Type of Home: Other(Comment) (town home) Home Access: Stairs to enter Entrance Stairs-Rails: None Technical brewer of Steps: 3 Home Layout: One level Home Equipment: None      Prior Function Level of Independence: Independent               Hand Dominance        Extremity/Trunk Assessment   Upper Extremity Assessment: Defer to OT evaluation           Lower Extremity Assessment: Generalized weakness         Communication   Communication: No difficulties  Cognition Arousal/Alertness: Awake/alert Behavior During Therapy: WFL for tasks assessed/performed Overall Cognitive Status: Within Functional Limits for tasks assessed                      General Comments General comments (skin  integrity, edema, etc.): Daughter present during session.    Exercises        Assessment/Plan    PT Assessment Patient needs continued PT services  PT Diagnosis Difficulty walking   PT Problem List Decreased strength;Cardiopulmonary status limiting activity;Decreased activity tolerance;Decreased balance;Decreased mobility  PT Treatment Interventions Balance training;Gait training;Stair  training;Functional mobility training;Therapeutic activities;Therapeutic exercise;Patient/family education   PT Goals (Current goals can be found in the Care Plan section) Acute Rehab PT Goals Patient Stated Goal: to return to independence.  PT Goal Formulation: With patient Time For Goal Achievement: 09/23/15 Potential to Achieve Goals: Good    Frequency Min 3X/week   Barriers to discharge Decreased caregiver support Lives alone    Co-evaluation               End of Session Equipment Utilized During Treatment: Oxygen Activity Tolerance: Patient tolerated treatment well;Treatment limited secondary to medical complications (Comment) (drop in Sp02 on RA. ) Patient left: in bed;with call bell/phone within reach;with family/visitor present Nurse Communication: Mobility status         Time: 3151-7616 PT Time Calculation (min) (ACUTE ONLY): 28 min   Charges:   PT Evaluation $Initial PT Evaluation Tier I: 1 Procedure PT Treatments $Gait Training: 8-22 mins   PT G Codes:        Neko Boyajian A Abeni Finchum 09/09/2015, 2:54 PM  Wray Kearns, [place], DPT [phone]

## 2015-09-09 NOTE — Progress Notes (Addendum)
      [address]       [place] [address]             [phone]            Subjective: He had an episode of diarrhea yesterday. He was given Imodium. Just waking up this am and he has no specific complaints.  Objective: Vital signs in last 24 hours: Temp:  [98.3 F (36.8 C)-99.1 F (37.3 C)] 98.3 F (36.8 C) (11/11 0600) Pulse Rate:  [94-99] 94 (11/11 0600) Cardiac Rhythm:  [-]  Resp:  [18-19] 18 (11/11 0600) BP: (113-133)/(57-69) 133/69 mmHg (11/11 0600) SpO2:  [93 %-96 %] 93 % (11/11 0600) Weight:  [136 lb 14.5 oz (62.1 kg)] 136 lb 14.5 oz (62.1 kg) (11/11 0600)     Intake/Output from previous day: 11/10 0701 - 11/11 0700 In: -  Out: 20 [Chest Tube:20]   Physical Exam:  Cardiovascular: RRR Pulmonary: Clear to auscultation bilaterally; no rales, wheezes, or rhonchi. Abdomen: Soft, non tender, bowel sounds present. Extremities: No  lower extremity edema. Wound: Dressing is clean and dry.   Chest Tube: to mini express  Lab Results: CBC:  Recent Labs  09/08/15 0314  WBC 11.8*  HGB 9.5*  HCT 28.5*  PLT 227   BMET: No results for input(s): NA, K, CL, CO2, GLUCOSE, BUN, CREATININE, CALCIUM in the last 72 hours.  PT/INR: No results for input(s): LABPROT, INR in the last 72 hours. ABG:  INR: Will add last result for INR, ABG once components are confirmed Will add last 4 CBG results once components are confirmed  Assessment/Plan:  1. CV - SR in the 80-90's. 2.  Pulmonary - On 2.5 liters of oxygen. Chest tube is to a mini express. There is scant output from chest tube. CXR this am appears stable (extensive consolidation on the right). 3. ID-On Augmentin. Per Dr. Servando Snare, to be continued 10 days post op. 4. Anemia-H and H stable 9.5 and 28.5 5. Will place sutures to secure chest tube 6. Possibly home in 1-2 days with home oxygen and chest tube to mini express  ZIMMERMAN,DONIELLE MPA-C 09/09/2015,7:48 AM   Patient will be safe to  go home when he is  able to walk 150 feet with PT  patient examined and medical record reviewed,agree with above note. Tharon Aquas Trigt III 09/09/2015

## 2015-09-09 NOTE — Progress Notes (Signed)
Patient reported having two episodes of diarrhea on 11/09, two episodes on 11/10, and one diarrhea stool today.  PA notified.  Patient placed on enteric precautions. Will collect stool sample for screening. Patient and family members educated on enteric precautions. All verbalize understanding and demonstrate compliance.

## 2015-09-09 NOTE — Progress Notes (Signed)
Purulence around chest tube and silk sutures. Silk sutures are loose but intact. I cleaned the area with betadine. Injected 1% Xylocaine locally and placed a 1 MH silk suture (left lateral to chest tube) in order to secure chest tube. Drain dressing and 4 x 4 gauze placed with tape. Also, there was an eschar with a silk suture at the previous chest tube site. Silk suture was removed. Patient tolerated procedure well.

## 2015-09-09 NOTE — Progress Notes (Signed)
Name: Gabriel [name] MRN: [id number] DOB: 04-13-1941    ADMISSION DATE:  08/23/2015 CONSULTATION DATE:  11/1   REFERRING MD :  Dr. Servando Snare   CHIEF COMPLAINT:  PNA   BRIEF PATIENT DESCRIPTION: 74 y/o M, current smoker, with PMH of adenocarcinoma of the lung and recurrent PTX who presented to [facility] on 10/25 with recurrent PTX.  CXR had evolving RLL airspace disease and PCCM consulted for evaluation.   SIGNIFICANT EVENTS  10/25  Admit for recurrent PTX despite chest tube  11/01  PCCM consulted for HCAP   STUDIES: CT Chest W/O (11/1) - dense consolidation within the RML, RLL, RUL c/w multilobar PNA, R chest tube with small apical ptx, small R effusion with interspersed gas, stable pulmonary nodules in L lung  CULTURES: Sputum (11/1):  Oral Flora Blood (10/6):  Negative x2  ANTIBIOTICS: Augmentin 11/8>>  Vancomycin 11/1 - 11/8 Zosyn 11/1 - 11/8  SUBJECTIVE: Patient reports he did ambulate in the hallway yesterday. Improving dyspnea. Cough remains intermittent and denies any hemoptysis. No new chest pain or pressure.  REVIEW OF SYSTEMS: Reports 3 loose stools per day. No abdominal pain. Reports routinely he is constipated. Denies any nausea or vomiting. Denies any subjective fever, chills, or sweats.  VITAL SIGNS: Temp:  [98.3 F (36.8 C)-99.1 F (37.3 C)] 98.3 F (36.8 C) (11/11 0600) Pulse Rate:  [94-99] 94 (11/11 0600) Resp:  [18-19] 18 (11/11 0600) BP: (113-133)/(57-69) 133/69 mmHg (11/11 0600) SpO2:  [91 %-96 %] 91 % (11/11 0925) Weight:  [62.1 kg (136 lb 14.5 oz)] 62.1 kg (136 lb 14.5 oz) (11/11 0600)  PHYSICAL EXAMINATION: General:  Awake. Alert. Patient ambulating from the bathroom to his bed. Integument:  Warm & dry. Right-sided chest tube in place to waterseal. HEEN:  Moist mucus membranes. No oral ulcers. No scleral injection. Cardiovascular:  Regular rate. Trace bilateral lower extremity edema.  Pulmonary:  Slightly decreased breath sounds right lung base.  Intermittent bilateral squeaks. Moderately increased work of breathing after ambulating to the bed. Abdomen: Soft. Normal bowel sounds. Nondistended.    Recent Labs Lab 09/03/15 0310 09/06/15 0515  NA 134*  --   K 3.4*  --   CL 95*  --   CO2 30  --   BUN 18  --   CREATININE 0.85 0.83  GLUCOSE 100*  --     Recent Labs Lab 09/03/15 0310 09/06/15 0515 09/08/15 0314  HGB 10.1* 10.3* 9.5*  HCT 29.9* 32.1* 28.5*  WBC 16.6* 12.7* 11.8*  PLT 227 230 227   Dg Chest 2 View  09/08/2015  CLINICAL DATA:  Pneumothorax. EXAM: CHEST  2 VIEW COMPARISON:  09/06/2015. 09/04/2015. 08/31/2015.08/30/2015. 08/29/2015. Chest CT 08/30/2015. FINDINGS: Right chest tube in stable position. Mediastinum and hilar structures are unremarkable. Heart size stable. Severe multifocal right pulmonary infiltrates again noted. Multiple pulmonary blebs are noted. Persistent right apical bleb and/or tiny pneumothorax, no change from multiple prior exams. Tiny density left upper lobe most likely a tiny focus of subsegmental atelectasis. Tiny nodule projected over left lung base most likely nipple shadow. Small right pleural effusion and/or pleural thickening. Old left rib fractures. IMPRESSION: 1. Right chest tube in stable position. 2. Multifocal persistent dense right lung infiltrates. Multiple pulmonary blebs are present. Tiny right apical bleb versus tiny apical pneumothorax again noted and unchanged from multiple prior exams. Electronically Signed   By: Marcello Moores  Register   On: 09/08/2015 07:44   Dg Chest Port 1 View  09/09/2015  CLINICAL DATA:  Right pneumothorax. EXAM: PORTABLE CHEST 1 VIEW COMPARISON:  09/08/2015 FINDINGS: The cardiomediastinal silhouette is unchanged with stable, mild rightward mediastinal shift related to volume loss in the right hemithorax. Right chest tube remains in place. Extensive, dense consolidation throughout the right lung does not appear significantly changed. Small loculated collections of  air again seen in the right upper lung without definite pneumothorax identified. Right pleural effusion is unchanged. Left lung is well aerated. IMPRESSION: Unchanged examination. Dense consolidation in the right lung with stable right pleural effusion. Electronically Signed   By: Logan Bores M.D.   On: 09/09/2015 07:24    ASSESSMENT / PLAN:  Unfortunate 74 year old male with acute on chronic hypoxic respiratory failure secondary to healthcare associated pneumonia complicated by a presumed COPD exacerbation and underlying stage III adenocarcinoma the right lung. Patient is having some loose stools with Augmentin but no abdominal pain. I'm not concerned about C. difficile infection at this time. Continuing management per cardiothoracic surgery. Pain seems to be well-controlled at this time from his chest tube. COPD seems to be stable.  1. Healthcare associated pneumonia: Continuing Augmentin 875 mg by mouth twice a day. Patient is currently on day #11/14 of antibiotics.  2. Acute on chronic hypoxic respiratory failure: Improving with pain control and pulmonary toilette. 3. Right pneumothorax: Status post chest tube placement. Management per thoracic surgery. Chest tube pain control with oxycodone & Dilaudid. 4. Stage III adenocarcinoma the right lung: Management per oncology. 5. Presumed COPD: No evidence of exacerbation. Continuing on Symbicort 160/4.5. 6. Protein calorie malnutrition: Vitamin C and B complex supplementation. Calorie supplementation with Ensure.  At this time I will sign off. Please contact the on-call physician if we can be of any further assistance in the care of this patient.  Sonia Baller Ashok Cordia, M.D. [facility] Pager:  [phone] After 3pm or if no response, call [phone]  09/09/2015 10:03 AM

## 2015-09-10 ENCOUNTER — Inpatient Hospital Stay (HOSPITAL_COMMUNITY): Payer: 59

## 2015-09-10 LAB — C DIFFICILE QUICK SCREEN W PCR REFLEX
C DIFFICILE (CDIFF) TOXIN: NEGATIVE
C Diff antigen: NEGATIVE
C Diff interpretation: NEGATIVE

## 2015-09-10 NOTE — Progress Notes (Addendum)
       [address]       [place] [address]             [phone]               Subjective: Comfortable, breathing stable. Fewer loose stools today.   Objective: Vital signs in last 24 hours: Patient Vitals for the past 24 hrs:  BP Temp Temp src Pulse Resp SpO2 Weight  09/10/15 0817 - - - - - 93 % -  09/10/15 0321 116/67 mmHg 98.8 F (37.1 C) Oral 99 18 92 % 135 lb 14.4 oz (61.644 kg)  09/09/15 2114 (!) 116/56 mmHg 98.9 F (37.2 C) Oral 95 17 94 % -  09/09/15 1501 (!) 106/55 mmHg 97.9 F (36.6 C) Oral 91 19 95 % -   Current Weight  09/10/15 135 lb 14.4 oz (61.644 kg)     Intake/Output from previous day: 11/11 0701 - 11/12 0700 In: 480 [P.O.:480] Out: 10 [Chest Tube:10]    PHYSICAL EXAM:  Heart: RRR Lungs: Few coarse BS bilaterally, R>L Wound: Stable, some thickened drainage on CT dressing, no erythema Chest tube: No obvious air leak    Lab Results: CBC: Recent Labs  09/08/15 0314  WBC 11.8*  HGB 9.5*  HCT 28.5*  PLT 227   BMET: No results for input(s): NA, K, CL, CO2, GLUCOSE, BUN, CREATININE, CALCIUM in the last 72 hours.  PT/INR: No results for input(s): LABPROT, INR in the last 72 hours.   CXR: FINDINGS: Chronic right lung opacities/ fibrosis with volume loss and small right pleural effusion. Right apical chest tube. No definite pneumothorax is seen.  Left lung is essentially clear.  The heart is normal in size.  IMPRESSION: Chronic right lung opacities/fibrosis.  Right apical chest tube. No definite pneumothorax is seen.   Assessment/Plan: R Pneumothorax- No air leak, CXR stable. Continue CT to Mini Express. PNA- Continue po Augmentin.  GI- C diff negative. Loose stools decreasing. Will hold stool softeners ( he has been getting Colace tid) and continue to watch. Appreciate palliative care consult. Deconditioning- Pt ambulated with PT yesterday, will continue PT.  Hopefully home soon. Watch drainage around  CT site. May need to culture if it persists   LOS: 18 days    COLLINS,GINA H 09/10/2015  Patient seen and examined, agree with above He is very uncomfortable about prospect of going home with the CT in place  [place] C. Roxan Hockey, MD [facility] [phone]

## 2015-09-11 NOTE — Progress Notes (Addendum)
       [address]       [place] [address]             [phone]               Subjective: Comfortable, breathing stable.   Objective: Vital signs in last 24 hours: Patient Vitals for the past 24 hrs:  BP Temp Temp src Pulse Resp SpO2 Weight  09/11/15 0502 116/62 mmHg 98.7 F (37.1 C) Oral 84 17 95 % 137 lb 4.8 oz (62.279 kg)  09/10/15 1943 (!) 120/57 mmHg 99 F (37.2 C) Oral 92 18 95 % -  09/10/15 1612 (!) 117/57 mmHg 98.4 F (36.9 C) Oral 91 19 92 % -   Current Weight  09/11/15 137 lb 4.8 oz (62.279 kg)     Intake/Output from previous day: 11/12 0701 - 11/13 0700 In: 480 [P.O.:480] Out: 610 [Urine:600; Chest Tube:10]    PHYSICAL EXAM:  Heart: RRR Lungs: Slightly decreased BS R base, but overall clearer today Wound: CT dressing with small amount purulent drainage, not actively draining Chest tube: No air leak    Lab Results: CBC:No results for input(s): WBC, HGB, HCT, PLT in the last 72 hours. BMET: No results for input(s): NA, K, CL, CO2, GLUCOSE, BUN, CREATININE, CALCIUM in the last 72 hours.  PT/INR: No results for input(s): LABPROT, INR in the last 72 hours.    Assessment/Plan: R Pneumothorax- No air leak, CXR stable. Continue CT to Mini Express. PNA- Continue po Augmentin.  GI- diarrhea resolved. Appreciate palliative care consult. Deconditioning- Continue PT.  Hopefully home soon. Watch drainage around CT site. May need to culture if it persists   LOS: 19 days    COLLINS,GINA H 09/11/2015  Feels better today Still very anxious about possibly going home with a chest tube  Remo Lipps C. Roxan Hockey, MD [facility] [phone]

## 2015-09-12 ENCOUNTER — Inpatient Hospital Stay (HOSPITAL_COMMUNITY): Payer: 59

## 2015-09-12 ENCOUNTER — Other Ambulatory Visit: Payer: 59

## 2015-09-12 ENCOUNTER — Telehealth: Payer: Self-pay | Admitting: Internal Medicine

## 2015-09-12 LAB — BASIC METABOLIC PANEL
ANION GAP: 6 (ref 5–15)
BUN: 14 mg/dL (ref 6–20)
CHLORIDE: 102 mmol/L (ref 101–111)
CO2: 26 mmol/L (ref 22–32)
Calcium: 8 mg/dL — ABNORMAL LOW (ref 8.9–10.3)
Creatinine, Ser: 0.8 mg/dL (ref 0.61–1.24)
GFR calc non Af Amer: >60 mL/min (ref >60)
Glucose, Bld: 97 mg/dL (ref 65–99)
POTASSIUM: 3.8 mmol/L (ref 3.5–5.1)
Sodium: 134 mmol/L — ABNORMAL LOW (ref 135–145)

## 2015-09-12 LAB — CBC
HEMATOCRIT: 27.8 % — AB (ref 39.0–52.0)
HEMOGLOBIN: 8.9 g/dL — AB (ref 13.0–17.0)
MCH: 28.8 pg (ref 26.0–34.0)
MCHC: 32 g/dL (ref 30.0–36.0)
MCV: 90 fL (ref 78.0–100.0)
Platelets: 300 10*3/uL (ref 150–400)
RBC: 3.09 MIL/uL — AB (ref 4.22–5.81)
RDW: 15.9 % — ABNORMAL HIGH (ref 11.5–15.5)
WBC: 11.3 10*3/uL — AB (ref 4.0–10.5)

## 2015-09-12 MED ORDER — POTASSIUM CHLORIDE CRYS ER 20 MEQ PO TBCR
20.0000 meq | EXTENDED_RELEASE_TABLET | Freq: Once | ORAL | Status: AC
  Administered 2015-09-12: 20 meq via ORAL
  Filled 2015-09-12: qty 1

## 2015-09-12 NOTE — Progress Notes (Signed)
SATURATION QUALIFICATIONS: (This note is used to comply with regulatory documentation for home oxygen)  Patient Saturations on 2 Liters of oxygen at Rest = 91%  Patient Saturations on 2 Liters of oxygen while Ambulating = 88% after 3 minutes.  Patient Saturations on 2 Liters of oxygen while Ambulating = 84% after 7 minutes.  Recovery Saturations on 2 Liters of oxygen = 91%   Patient did not attempt to ambulate on room air today.

## 2015-09-12 NOTE — Progress Notes (Signed)
Daily Progress Note   Patient Name: Gabriel [name]       Date: 09/12/2015 DOB: 25-Jun-1941  Age: 74 y.o. MRN#: [id number] Attending Physician: Grace Isaac, MD Primary Care Physician: Cammy Copa, MD Admit Date: 08/23/2015  Reason for Consultation/Follow-up: Establishing goals of care  Subjective: Gabriel [name] is sitting up on side of bed. Converses easily with no shortness of breath. He expresses concern at returning home and wants to make sure that he is "in a good place" before returning home. He is fearful of further decline and rehospitalization and wants everything that can be done to prevent this.   We also discussed if he had considered completing Advance Directives. He tells me that he is in the process of completing and that he has discussed these decisions with his children. He tells me that he does want resuscitation - unless if we thought this was just to prolong his death. For now he wants to maintain full code. Emotional support provided.   Length of Stay: 20 days  Current Medications: Scheduled Meds:  . amoxicillin-clavulanate  1 tablet Oral Q12H  . B-complex with vitamin C  1 tablet Oral Daily  . budesonide-formoterol  2 puff Inhalation BID  . enoxaparin (LOVENOX) injection  30 mg Subcutaneous QHS  . feeding supplement (ENSURE ENLIVE)  237 mL Oral BID BM  . guaiFENesin  1,200 mg Oral BID  . magic mouthwash  15 mL Oral TID  . saccharomyces boulardii  250 mg Oral BID  . sodium chloride  3 mL Intravenous Q12H  . vitamin C  1,000 mg Oral Daily    Continuous Infusions:    PRN Meds: albuterol, ALPRAZolam, bisacodyl, HYDROmorphone (DILAUDID) injection, loperamide, ondansetron **OR** ondansetron (ZOFRAN) IV, oxyCODONE, polyethylene glycol,  traMADol  Physical Exam: Physical Exam  Constitutional: He is oriented to person, place, and time. He appears well-developed. No distress.  HENT:  Head: Normocephalic.  Pulmonary/Chest: Effort normal.  Neurological: He is alert and oriented to person, place, and time.                Vital Signs: BP 129/62 mmHg  Pulse 99  Temp(Src) 98.4 F (36.9 C) (Oral)  Resp 18  Ht '5\' 10"'$  (1.778 m)  Wt 62.279 kg (137 lb 4.8 oz)  BMI 19.70 kg/m2  SpO2 96% SpO2:  SpO2: 96 % O2 Device: O2 Device: Nasal Cannula O2 Flow Rate: O2 Flow Rate (L/min): 2 L/min  Intake/output summary:   Intake/Output Summary (Last 24 hours) at 09/12/15 1438 Last data filed at 09/12/15 1230  Gross per 24 hour  Intake    720 ml  Output      0 ml  Net    720 ml   LBM: Last BM Date: 09/12/15 Baseline Weight: Weight: 60.328 kg (133 lb) Most recent weight: Weight: 62.279 kg (137 lb 4.8 oz)       Palliative Assessment/Data: Flowsheet Rows        Most Recent Value   Intake Tab    Referral Department  Surgery   Unit at Time of Referral  Cardiac/Telemetry Unit   Palliative Care Primary Diagnosis  Cancer   Date Notified  09/06/15   Palliative Care Type  New Palliative care   Reason for referral  Clarify Goals of Care   Date of Admission  08/23/15   # of days IP prior to Palliative referral  14   Clinical Assessment    Psychosocial & Spiritual Assessment    Palliative Care Outcomes       Additional Data Reviewed: CBC    Component Value Date/Time   WBC 11.3* 09/12/2015 0410   WBC 10.3 07/25/2015 1039   RBC 3.09* 09/12/2015 0410   RBC 4.77 07/25/2015 1039   HGB 8.9* 09/12/2015 0410   HGB 14.7 07/25/2015 1039   HCT 27.8* 09/12/2015 0410   HCT 43.7 07/25/2015 1039   PLT 300 09/12/2015 0410   PLT 298 07/25/2015 1039   MCV 90.0 09/12/2015 0410   MCV 91.6 07/25/2015 1039   MCH 28.8 09/12/2015 0410   MCH 30.9 07/25/2015 1039   MCHC 32.0 09/12/2015 0410   MCHC 33.7 07/25/2015 1039   RDW 15.9* 09/12/2015  0410   RDW 18.1* 07/25/2015 1039   LYMPHSABS 0.8 09/08/2015 0314   LYMPHSABS 0.9 07/25/2015 1039   MONOABS 0.7 09/08/2015 0314   MONOABS 0.9 07/25/2015 1039   EOSABS 0.1 09/08/2015 0314   EOSABS 0.1 07/25/2015 1039   BASOSABS 0.0 09/08/2015 0314   BASOSABS 0.1 07/25/2015 1039    CMP     Component Value Date/Time   NA 134* 09/12/2015 0410   NA 138 07/25/2015 1039   K 3.8 09/12/2015 0410   K 4.2 07/25/2015 1039   CL 102 09/12/2015 0410   CO2 26 09/12/2015 0410   CO2 25 07/25/2015 1039   GLUCOSE 97 09/12/2015 0410   GLUCOSE 111 07/25/2015 1039   BUN 14 09/12/2015 0410   BUN 19.2 07/25/2015 1039   CREATININE 0.80 09/12/2015 0410   CREATININE 0.9 07/25/2015 1039   CALCIUM 8.0* 09/12/2015 0410   CALCIUM 9.7 07/25/2015 1039   PROT 5.8* 09/02/2015 0950   PROT 7.4 07/25/2015 1039   ALBUMIN 1.7* 09/02/2015 0950   ALBUMIN 3.8 07/25/2015 1039   AST 28 09/02/2015 0950   AST 18 07/25/2015 1039   ALT 33 09/02/2015 0950   ALT 20 07/25/2015 1039   ALKPHOS 181* 09/02/2015 0950   ALKPHOS 98 07/25/2015 1039   BILITOT 1.2 09/02/2015 0950   BILITOT 0.34 07/25/2015 1039   GFRNONAA >60 09/12/2015 0410   GFRAA >60 09/12/2015 0410       Problem List:  Patient Active Problem List   Diagnosis Date Noted  . Palliative care encounter 09/07/2015  . Weakness generalized   . Right-sided chest pain 08/31/2015  . Pneumonia   . Pneumothorax 08/23/2015  .  Pneumothorax, right   . Chest tube in place   . HCAP (healthcare-associated pneumonia) 08/04/2015  . COPD exacerbation ([place]) 08/04/2015  . COPD (chronic obstructive pulmonary disease) ([place]) 07/28/2015  . Encounter for antineoplastic chemotherapy 05/09/2015  . Non-small cell carcinoma of right lung, stage 3 ([place]) 04/21/2015  . Hemoptysis, unspecified 10/07/2013     Palliative Care Assessment & Plan   2 1.Code Status:  Full code    Code Status Orders        Start     Ordered   08/23/15 1805  Full code   Continuous      08/23/15 1807       2. Goals of Care/Additional Recommendations:  Gain strength enough to return home and continue chemotherapy treatment.   Desire for further Chaplaincy support:no  Psycho-social Needs: Caregiving  Support/Resources  3. Symptom Management:  Pain: Oxycodone 5 mg every 4 hours prn.   Anxiety: Xanax 0.5 mg TID prn.   SOB: Continue to manage CT per CVTS. Improving.   Weakness: Consider PT consult. Optimize nutrition.  Malnutrition: Discussed important of nutrition with small frequent meals/snacks. Supplement between meals to optimize caloric energy to work towards his goals.  4. Palliative Prophylaxis:   Bowel Regimen, Delirium Protocol and Frequent Pain Assessment  5. Prognosis: Unable to determine  6. Discharge Planning:  Home with Home Health -  His main concern is that he will return home and not have what he needs and be a disaster.    Thank you for allowing the Palliative Medicine Team to assist in the care of this patient.   Time In: 1340 Time Out: 1410 Total Time 55mn Prolonged Time Billed  no         APershing Proud NP  117/40/8144 2:38 PM  Please contact Palliative Medicine Team phone at [phone] questions and concerns.

## 2015-09-12 NOTE — Care Management Note (Signed)
Case Management Note [name] RN, BSN Unit 2W-Case Manager [phone]  Patient Details  Name: Gabriel [name] MRN: [id number] Date of Birth: April 26, 1941  Subjective/Objective:    Pt admitted with increase in Pntx, persistent airleak                Action/Plan:  Update  Pending discharge plan:  Pt will discharge home on oral antibiotics, mini express and possibly home 02; bedside nurse actively attempting to wean pt from oxygen; will perform ambulation test prior to discharge.  CM informed [facility] of pending referral for home oxygen.  [facility] HH is aware of pt admit and pending referral for [facility] resumption orders for chest tube care. [name], RN, BSN [phone]  09/01/15 PTA pt lived at home- was active with [facility] for [place] - with chest tube in place at discharge to mini express- pt will need resumption orders for discharge- if he returns home again with chest tube in place- NCM to follow for d/c needs [name]   Expected Discharge Date:                  Expected Discharge Plan:  [place]  In-House Referral:     Discharge planning Services  CM Consult  Post Acute Care Choice:  Resumption of Svcs/PTA Provider Choice offered to:     DME Arranged:    DME Agency:     HH Arranged:   RN [place] Agency:  [place]  Status of Service:  In process, will continue to follow  Medicare Important Message Given:  Yes-second notification given Date Medicare IM Given:    Medicare IM give by:    Date Additional Medicare IM Given:    Additional Medicare Important Message give by:     If discussed at [place] of Stay Meetings, dates discussed:  08/30/15, 09/01/15,09/06/15, 09/08/15  Additional Comments:  09/12/15- [name] RN, BSN- spoke with pt at bedside- per conversation pt states that he already has home 02 arranged at home with [facility]- pt reports that he has both concentrator and portable 02 at home- spoke with [place] at [facility] and confirmed  that pt has been set up with home 02 and there is no further documentation needed. Pt's family will need to bring one of the portable tanks from home for discharge- pt already had HH-orders for resumption of HH-RN for Mini express CT care. CM will continue to follow for d/c needs.  Pt CT changed to mini express 08/07/15.  Pt remains on 3 L [place]  09/01/15 - Breyah Akhter RN, BSN - pt remains with CT- waterseal- also with RLL airspace dx- on IV abx- continues to need 5L of 02- may need home 02 arranged at discharge- will need to have qualifying ambulation documentation done to qualify for insurance- was active with [facility] for HH-RN and if needed will need resumption orders- NCM to continue to follow for d/c needs  Dahlia Client Romeo Rabon, RN 09/12/2015, 3:50 PM

## 2015-09-12 NOTE — Progress Notes (Addendum)
      [address]       [place], [address]             [phone]          Subjective: Patient eating breakfast this am. No specific complaints this am.  Objective: Vital signs in last 24 hours: Temp:  [98.3 F (36.8 C)-98.7 F (37.1 C)] 98.3 F (36.8 C) (11/14 0414) Pulse Rate:  [89-93] 89 (11/14 0414) Cardiac Rhythm:  [-]  Resp:  [17-18] 18 (11/14 0414) BP: (111-116)/(53-61) 111/55 mmHg (11/14 0414) SpO2:  [93 %-96 %] 94 % (11/14 0414)     Intake/Output from previous day: 11/13 0701 - 11/14 0700 In: 240 [P.O.:240] Out: 0    Physical Exam:  Cardiovascular: RRR Pulmonary: Clear to auscultation on left and coarse on right. Abdomen: Soft, non tender, bowel sounds present. Extremities: No  lower extremity edema. Chest Tube: to mini express. Purulence around chest tube site.  Lab Results: CBC:  Recent Labs  09/12/15 0410  WBC 11.3*  HGB 8.9*  HCT 27.8*  PLT 300   BMET:   Recent Labs  09/12/15 0410  NA 134*  K 3.8  CL 102  CO2 26  GLUCOSE 97  BUN 14  CREATININE 0.80  CALCIUM 8.0*    PT/INR: No results for input(s): LABPROT, INR in the last 72 hours. ABG:  INR: Will add last result for INR, ABG once components are confirmed Will add last 4 CBG results once components are confirmed  Assessment/Plan:  1. CV - SR in the 80-90's. 2.  Pulmonary - On 2 liters of oxygen. Chest tube is to a mini express. CXR this am appears stable (extensive consolidation on the right and clear on the left). 3. ID-On Augmentin. 4. Anemia-H and H stable 8.9 and 27.8 5.  Has had intermittent diarrhea, which is now resolved. C Dif negative. 6. Supplement potassium 7. Will discuss disposition with surgeon  ZIMMERMAN,DONIELLE MPA-C 09/12/2015,7:55 AM   Slow improvement, feels better Discussed going home with the patient, poss Wednesday Will leave ct tube for now due to "murcky" drainage I have seen and examined Gabriel [name] and agree with the  above assessment  and plan.  [name] Beeper [phone] Office [phone] 09/12/2015 6:31 PM

## 2015-09-12 NOTE — Telephone Encounter (Signed)
Left message to call back  

## 2015-09-12 NOTE — Progress Notes (Signed)
PT Cancellation Note  Patient Details Name: Gabriel [name] MRN: [id number] DOB: 12-Nov-1940   Cancelled Treatment:    Reason Eval/Treat Not Completed: Patient declined, no reason specified Pt declined participating in therapy this AM stating, "I can never get any sleep around here." Pt reports he wants to ambulate within room on his own and record his Sp02. Discussion re: range of Sp02, level of activity, breathing etc. PT asked if she could return later, but pt reports not today.     Marguarite Arbour A Deneisha Dade 09/12/2015, 9:54 AM  Wray Kearns, PT, DPT [phone]

## 2015-09-13 ENCOUNTER — Inpatient Hospital Stay (HOSPITAL_COMMUNITY): Payer: 59

## 2015-09-13 LAB — CREATININE, SERUM
Creatinine, Ser: 0.79 mg/dL (ref 0.61–1.24)
GFR calc Af Amer: >60 mL/min (ref >60)
GFR calc non Af Amer: >60 mL/min (ref >60)

## 2015-09-13 NOTE — Telephone Encounter (Signed)
Pt returning call.Gabriel [name] ° °

## 2015-09-13 NOTE — Progress Notes (Addendum)
      [address]       [place] [address]             [phone]          Subjective: Patient eating breakfast again this am. No specific complaints this am.  Objective: Vital signs in last 24 hours: Temp:  [98.2 F (36.8 C)-98.6 F (37 C)] 98.6 F (37 C) (11/15 0508) Pulse Rate:  [89-99] 89 (11/15 0508) Cardiac Rhythm:  [-]  Resp:  [18-20] 20 (11/15 0508) BP: (103-129)/(52-62) 103/62 mmHg (11/15 0508) SpO2:  [93 %-96 %] 93 % (11/15 0508) Weight:  [137 lb 9.1 oz (62.4 kg)] 137 lb 9.1 oz (62.4 kg) (11/15 0508)     Intake/Output from previous day: 11/14 0701 - 11/15 0700 In: 1200 [P.O.:1200] Out: 0    Physical Exam:  Cardiovascular: RRR Pulmonary: Coarse breathe sounds bilaterally Abdomen: Soft, non tender, bowel sounds present. Extremities: No  lower extremity edema. Chest Tube: to mini express. Purulence around chest tube site.  Lab Results: CBC:  Recent Labs  09/12/15 0410  WBC 11.3*  HGB 8.9*  HCT 27.8*  PLT 300   BMET:   Recent Labs  09/12/15 0410 09/13/15 0457  NA 134*  --   K 3.8  --   CL 102  --   CO2 26  --   GLUCOSE 97  --   BUN 14  --   CREATININE 0.80 0.79  CALCIUM 8.0*  --     PT/INR: No results for input(s): LABPROT, INR in the last 72 hours. ABG:  INR: Will add last result for INR, ABG once components are confirmed Will add last 4 CBG results once components are confirmed  Assessment/Plan:  1. CV - SR in the 80-90's. 2.  Pulmonary - On 2 liters of oxygen. Chest tube is to a mini express. CXR this am appears stable (extensive consolidation on the right and clear on the left). 4. Anemia-H and H stable 8.9 and 27.8 5.  Has had intermittent diarrhea, which is now resolved. C Dif negative. 6. Possibly home in the next 24-48 hours with ct to mini express.  ZIMMERMAN,Gabriel [name] 09/13/2015,8:07 AM  patient already has o2 at home  Has been on 2 l, Poss home with home nursing tomorrow Will contact  oncology about follow up I have seen and examined Gabriel [name] and agree with the above assessment  and plan.  [name] Beeper [phone] Office [phone] 09/13/2015 10:21 AM

## 2015-09-13 NOTE — Consult Note (Addendum)
WOC wound consult note Reason for Consult: Consult requested for sacral wound.  Wound type: .3X.3X.2cm full thickness wound located to the inner gluteal fold, can only be assessed when buttock cheeks are spread apart. Location is consistent with a fissure related to moisture, which has evolved into full thickness skin loss at this time.  Pt has been having frequent diarrhea and is very emaciated and admits he has has a poor appetitie.  Pressure Ulcer POA: Appearance and location is NOT consistent with a pressure ulcer Wound bed: 90% red and moist, 10% yellow Drainage (amount, consistency, odor) Mod amt yellow drainage Periwound: Intact skin surrounding Dressing procedure/placement/frequency: Foam dressing to protect and promote healing.  Discussed plan of care with patient and he verbalized understanding. Please re-consult if further assistance is needed.  Thank-you,  Julien Girt MSN, [place], [place], [place], [place]

## 2015-09-13 NOTE — Progress Notes (Signed)
Physical Therapy Treatment Patient Details Name: Gabriel [name] MRN: [id number] DOB: 08-13-1941 Today's Date: 09/13/2015    History of Present Illness Patient is a 74 y/o male with hx of adenocarcinoma of the lung, COPD and recurrent PTX who presented to [facility] on 10/25 with recurrent PTX. CXR had evolving RLL airspace disease, right pleural effusion. S/p chest tube placement.    PT Comments    Progressing well.  Needed to increase O2 supply to 3 or more L due to falling SpO2 level over 240 feet of walking.  Follow Up Recommendations  No PT follow up;Supervision - Intermittent     Equipment Recommendations  None recommended by PT    Recommendations for Other Services       Precautions / Restrictions Precautions Precautions: Fall Precaution Comments: watch 02 sats, 1 chest tube to water seal Restrictions Weight Bearing Restrictions: No    Mobility  Bed Mobility Overal bed mobility: Needs Assistance Bed Mobility: Supine to Sit     Supine to sit: Supervision        Transfers Overall transfer level: Needs assistance   Transfers: Sit to/from Stand Sit to Stand: Supervision         General transfer comment: Supervision for safety.   Ambulation/Gait Ambulation/Gait assistance: Supervision Ambulation Distance (Feet): 240 Feet Assistive device: None Gait Pattern/deviations: Step-through pattern     General Gait Details: pulling portable O2, slow guarded.  SpO2 at first standing rest  84%, EHR 98 bpm on 2L [place], 2nd rest, 85% at 103 bpm, bumped to 3L [place] at next rest with 83% and 102 bpm  90% in 2-3 min.   Stairs            Wheelchair Mobility    Modified Rankin (Stroke Patients Only)       Balance Overall balance assessment: Needs assistance Sitting-balance support: No upper extremity supported Sitting balance-Leahy Scale: Good     Standing balance support: No upper extremity supported Standing balance-Leahy Scale: Fair                       Cognition Arousal/Alertness: Awake/alert Behavior During Therapy: WFL for tasks assessed/performed Overall Cognitive Status: Within Functional Limits for tasks assessed                      Exercises      General Comments        Pertinent Vitals/Pain Pain Assessment: Faces Faces Pain Scale: Hurts little more Pain Location: right side Pain Descriptors / Indicators: Guarding;Grimacing Pain Intervention(s): Monitored during session    Home Living                      Prior Function            PT Goals (current goals can now be found in the care plan section) Acute Rehab PT Goals Patient Stated Goal: to return to independence.  PT Goal Formulation: With patient Time For Goal Achievement: 09/23/15 Potential to Achieve Goals: Good Progress towards PT goals: Progressing toward goals    Frequency  Min 3X/week    PT Plan Current plan remains appropriate    Co-evaluation             End of Session Equipment Utilized During Treatment: Oxygen Activity Tolerance: Patient tolerated treatment well;Treatment limited secondary to medical complications (Comment) Patient left: in bed;with call bell/phone within reach;with family/visitor present     Time: 7425-9563 PT Time Calculation (min) (  ACUTE ONLY): 20 min  Charges:  $Gait Training: 8-22 mins                    G Codes:      Treyvonne Tata, Tessie Fass 09/13/2015, 12:51 PM 09/13/2015  Donnella Sham, PT [phone]  (pager)

## 2015-09-13 NOTE — Progress Notes (Signed)
Utilization review completed.  

## 2015-09-13 NOTE — Telephone Encounter (Signed)
lmtcb for pt.  

## 2015-09-14 ENCOUNTER — Telehealth: Payer: Self-pay | Admitting: Internal Medicine

## 2015-09-14 ENCOUNTER — Inpatient Hospital Stay (HOSPITAL_COMMUNITY): Payer: 59

## 2015-09-14 DIAGNOSIS — L8990 Pressure ulcer of unspecified site, unspecified stage: Secondary | ICD-10-CM | POA: Insufficient documentation

## 2015-09-14 DIAGNOSIS — C3491 Malignant neoplasm of unspecified part of right bronchus or lung: Secondary | ICD-10-CM

## 2015-09-14 MED ORDER — FUROSEMIDE 40 MG PO TABS
40.0000 mg | ORAL_TABLET | Freq: Once | ORAL | Status: AC
  Administered 2015-09-14: 40 mg via ORAL
  Filled 2015-09-14: qty 1

## 2015-09-14 MED ORDER — ALPRAZOLAM 0.5 MG PO TABS: 0.5000 mg | ORAL_TABLET | Freq: Three times a day (TID) | ORAL | Status: DC | PRN

## 2015-09-14 MED ORDER — AMOXICILLIN-POT CLAVULANATE 875-125 MG PO TABS: 1.0000 | ORAL_TABLET | Freq: Two times a day (BID) | ORAL | Status: DC

## 2015-09-14 MED ORDER — GUAIFENESIN ER 600 MG PO TB12: 600.0000 mg | ORAL_TABLET | Freq: Two times a day (BID) | ORAL | Status: AC | PRN

## 2015-09-14 MED ORDER — POTASSIUM CHLORIDE CRYS ER 20 MEQ PO TBCR
40.0000 meq | EXTENDED_RELEASE_TABLET | Freq: Once | ORAL | Status: AC
  Administered 2015-09-14: 40 meq via ORAL
  Filled 2015-09-14: qty 2

## 2015-09-14 MED ORDER — OXYCODONE HCL 5 MG PO TABS: 5.0000 mg | ORAL_TABLET | Freq: Four times a day (QID) | ORAL | Status: DC | PRN

## 2015-09-14 MED ORDER — AMOXICILLIN-POT CLAVULANATE 875-125 MG PO TABS
1.0000 | ORAL_TABLET | Freq: Two times a day (BID) | ORAL | Status: DC
  Administered 2015-09-14: 1 via ORAL
  Filled 2015-09-14: qty 1

## 2015-09-14 NOTE — Telephone Encounter (Signed)
Gabriel [name]  I changed appt to TP 10/03/15. Can you please have him get a CT chest wo contrast 1-2 days or day of seeing her.  Thanks  Dr. Brand Males, M.D., [facility].C.P Pulmonary and Critical Care Medicine Staff Physician [place] [facility] Pager: [phone], If no answer or between  15:00h - 7:00h: call [phone]  09/14/2015 9:39 AM

## 2015-09-14 NOTE — Telephone Encounter (Signed)
Left message for patient to call back  

## 2015-09-14 NOTE — Progress Notes (Signed)
   09/14/15 1100  Clinical Encounter Type  Visited With Patient and family together  Visit Type Initial;Other (Comment)  Referral From Palliative care team  Spiritual Encounters  Spiritual Needs Emotional  Stress Factors  Patient Stress Factors Other (Comment)  Family Stress Factors (Life changes )  Chaplain visited with Pt and daughter; Chaplain spoke directly to them;  Pt notified chaplain that he was being discharged

## 2015-09-14 NOTE — Care Management Note (Signed)
Case Management Note [name] RN, BSN Unit 2W-Case Manager [phone]  Patient Details  Name: CAP MASSI MRN: [id number] Date of Birth: 08/22/1941  Subjective/Objective:    Pt admitted with increase in Pntx, persistent airleak                Action/Plan:  Update  Pending discharge plan:  Pt will discharge home on oral antibiotics, mini express and possibly home 02; bedside nurse actively attempting to wean pt from oxygen; will perform ambulation test prior to discharge.  CM informed [facility] of pending referral for home oxygen.  [facility] HH is aware of pt admit and pending referral for [facility] resumption orders for chest tube care. [name], RN, BSN [phone]  09/01/15 PTA pt lived at home- was active with [facility] for [place] - with chest tube in place at discharge to mini express- pt will need resumption orders for discharge- if he returns home again with chest tube in place- NCM to follow for d/c needs [name]   Expected Discharge Date:      09/14/15            Expected Discharge Plan:  [place]  In-House Referral:     Discharge planning Services  CM Consult  Post Acute Care Choice:  Resumption of Svcs/PTA Provider, Home Health Choice offered to:     DME Arranged:   home 02 DME Agency:   [facility]  HH Arranged:  RN [place] Agency:  [place]  Status of Service:  Completed, signed off  Medicare Important Message Given:  Yes-second notification given Date Medicare IM Given:    Medicare IM give by:    Date Additional Medicare IM Given:    Additional Medicare Important Message give by:     If discussed at [place] of Stay Meetings, dates discussed:  08/30/15, 09/01/15,09/06/15, 09/08/15, 09/13/15  Additional Comments:  09/14/15- [name] RN, BSN - pt for d/c home today- order in for [facility] and home 02- have alerted Santiago Glad with [facility] of pt's discharge today and to resume [facility] RN for chest tube management- pt already has home 02 arranged with  [facility]-   09/12/15- [name] RN, BSN- spoke with pt at bedside- per conversation pt states that he already has home 02 arranged at home with [facility]- pt reports that he has both concentrator and portable 02 at home- spoke with [place] at [facility] and confirmed that pt has been set up with home 02 and there is no further documentation needed. Pt's family will need to bring one of the portable tanks from home for discharge- pt already had HH-orders for resumption of HH-RN for Mini express CT care. CM will continue to follow for d/c needs.  Pt CT changed to mini express 08/07/15.  Pt remains on 3 L [place]  09/01/15 - Keliyah Spillman RN, BSN - pt remains with CT- waterseal- also with RLL airspace dx- on IV abx- continues to need 5L of 02- may need home 02 arranged at discharge- will need to have qualifying ambulation documentation done to qualify for insurance- was active with [facility] for HH-RN and if needed will need resumption orders- NCM to continue to follow for d/c needs  Dahlia Client Romeo Rabon, RN 09/14/2015, 10:04 AM

## 2015-09-14 NOTE — Telephone Encounter (Signed)
Spoke with the pt and notified of recs per MR  He verbalized understanding  Order was sent to [facility]

## 2015-09-14 NOTE — Progress Notes (Addendum)
      [address]       [place] [address]             [phone]          Subjective: No specific complaints this am.  Objective: Vital signs in last 24 hours: Temp:  [98.4 F (36.9 C)-98.5 F (36.9 C)] 98.4 F (36.9 C) (11/16 0458) Pulse Rate:  [90-95] 90 (11/16 0458) Cardiac Rhythm:  [-]  Resp:  [16-20] 20 (11/16 0458) BP: (112-116)/(54-59) 112/56 mmHg (11/16 0458) SpO2:  [92 %-95 %] 95 % (11/16 0923) Weight:  [142 lb 13.7 oz (64.8 kg)] 142 lb 13.7 oz (64.8 kg) (11/16 0458)     Intake/Output from previous day: 11/15 0701 - 11/16 0700 In: 840 [P.O.:840] Out: -    Physical Exam:  Cardiovascular: RRR Pulmonary: Coarse breathe sounds bilaterally Abdomen: Soft, non tender, bowel sounds present. Extremities: ++ LE edema. Chest Tube: to mini express. Purulence around chest tube site.  Lab Results: CBC:  Recent Labs  09/12/15 0410  WBC 11.3*  HGB 8.9*  HCT 27.8*  PLT 300   BMET:   Recent Labs  09/12/15 0410 09/13/15 0457  NA 134*  --   K 3.8  --   CL 102  --   CO2 26  --   GLUCOSE 97  --   BUN 14  --   CREATININE 0.80 0.79  CALCIUM 8.0*  --     PT/INR: No results for input(s): LABPROT, INR in the last 72 hours. ABG:  INR: Will add last result for INR, ABG once components are confirmed Will add last 4 CBG results once components are confirmed  Assessment/Plan:  1. CV - SR in the 80-90's. 2.  Pulmonary - On 2 liters of oxygen. Chest tube is to a mini express. CXR this am appears stable (extensive consolidation on the right and clear on the left). 3. Anemia-Last H and H stable 8.9 and 27.8 4. Per my discussion with Dr. Chase Caller, will restart Augmentin. Patient needs a total of 3 weeks of antibiotics (started 11/01) and Dr. Servando Snare wants him on antibiotic until he is seen in office 5. Will give a dose of Lasix today for LE edema 6. Arrange for [facility] then discharge with chest tube to mini express  Eldwin Volkov  MPA-C 09/14/2015,9:34 AM

## 2015-09-14 NOTE — Progress Notes (Signed)
Name: Gabriel [name] MRN: [id number] DOB: [birth year]    ADMISSION DATE:  08/23/2015 CONSULTATION DATE:  11/1   REFERRING MD :  Dr. Servando Snare   CHIEF COMPLAINT:  PNA   BRIEF PATIENT DESCRIPTION: 74 y/o M, current smoker, with PMH of adenocarcinoma of the lung and recurrent PTX who presented to [facility] on 10/25 with recurrent PTX.  CXR had evolving RLL airspace disease and PCCM consulted for evaluation.   SIGNIFICANT EVENTS  10/25  Admit for recurrent PTX despite chest tube  11/01  PCCM consulted for HCAP     11/01  CT Chest w/o >> dense consolidation within the RML, RLL, RUL c/w multilobar PNA, R chest tube with small apical ptx, small R effusion with interspersed gas, stable pulmonary nodules in L lung  08/31/15:   Pt reports right chest pain overnight with coughing episode.  Pain relieved with oxycodone.  O2 increased to 5L/[facility]    09/01/15 - feels some better. Eating food.     SUBJECTIVE/OVERNIGHT/INTERVAL HX 09/14/2015 - asked to re-round. DC home 09/14/2015 possibly per discussion with Dr Servando Snare. Patient now having purulent drain via chest tube. Dr Servando Snare has left chest tube in situ. Per patient he will be dischargted on po augmentin. He still on [place] o2. Feels he is turning corner last few days  VITAL SIGNS: Temp:  [98.4 F (36.9 C)-98.5 F (36.9 C)] 98.4 F (36.9 C) (11/16 0458) Pulse Rate:  [90-95] 90 (11/16 0458) Resp:  [16-20] 20 (11/16 0458) BP: (112-116)/(54-59) 112/56 mmHg (11/16 0458) SpO2:  [92 %-95 %] 93 % (11/16 0458) Weight:  [64.8 kg (142 lb 13.7 oz)] 64.8 kg (142 lb 13.7 oz) (11/16 0458)  PHYSICAL EXAMINATION: General:  Chronically ill appearing male in NAD. Lookng better Neuro:  AAOx4, speech clear, MAE  HEENT:  No jvd, MM pink/dry Cardiovascular:  s1s2 rrr, no m/r/g, R CT with 1/7 air leak with cough, otherwise no leak  Lungs: prolonged exp phase, on 5L, bronchial breath sounds on R, crackles RLL, L with crackles lower lobe - IMPROVED. RT CHEST TUBE WITH  PURULENCe (new since 09/01/15) Abdomen:  Soft, NTND, bsx4 active  Musculoskeletal:  No acute deformities  Skin:  Warm/dry, no edema, rashes or lesions   Recent Labs Lab 09/12/15 0410 09/13/15 0457  NA 134*  --   K 3.8  --   CL 102  --   CO2 26  --   BUN 14  --   CREATININE 0.80 0.79  GLUCOSE 97  --     Recent Labs Lab 09/08/15 0314 09/12/15 0410  HGB 9.5* 8.9*  HCT 28.5* 27.8*  WBC 11.8* 11.3*  PLT 227 300   Dg Chest Port 1 View  09/14/2015  CLINICAL DATA:  Chest tube in place EXAM: PORTABLE CHEST 1 VIEW COMPARISON:  09/13/2015; 09/12/2015; 09/10/2015 ; 08/30/2015; chest CT - 08/30/2015 FINDINGS: There is persistent obscuration of the right heart border and right-sided mediastinal contours secondary to grossly unchanged extensive heterogeneous airspace opacities and suspected small to moderate-sized likely loculated right-sided pleural effusion. Stable position of support apparatus. No pneumothorax. Left lung remains normally aerated. No acute osseus abnormalities. IMPRESSION: 1.  Stable positioning of support apparatus.  No pneumothorax. 2. Grossly unchanged findings of extensive presumed multi focal infection involving the right lung with associated small to moderate likely loculated right-sided effusion. Electronically Signed   By: Sandi Mariscal M.D.   On: 09/14/2015 07:43   Dg Chest Port 1 View  09/13/2015  CLINICAL DATA:  Right-sided  pneumothorax. EXAM: PORTABLE CHEST 1 VIEW COMPARISON:  September 12, 2015. FINDINGS: Stable cardiomediastinal silhouette. Old left rib fractures are noted. Right-sided chest tube is again noted and unchanged in position. Stable diffuse opacities of the right lung are noted concerning for pneumonia and/or atelectasis. Stable mild pleural fluid is noted in the apex and laterally in right lung base which may be loculated. Stable air collection is seen in right upper lobe which may represent small loculated pneumothorax. IMPRESSION: Stable position of  right-sided chest tube. Stable diffuse opacities are seen in the right lung. Stable probable loculated pleural effusion seen in the upper and lower portions of right hemithorax. Stable probable small loculated pneumothorax seen in right lung apex. Electronically Signed   By: Marijo Conception, M.D.   On: 09/13/2015 08:07    Anti-infectives    Start     Dose/Rate Route Frequency Ordered Stop   09/06/15 1800  amoxicillin-clavulanate (AUGMENTIN) 875-125 MG per tablet 1 tablet     1 tablet Oral Every 12 hours 09/06/15 0916 09/13/15 0509   09/05/15 1400  vancomycin (VANCOCIN) IVPB 1000 mg/200 mL premix  Status:  Discontinued     1,000 mg 200 mL/hr over 60 Minutes Intravenous Every 12 hours 09/05/15 1352 09/06/15 0916   08/30/15 1145  vancomycin (VANCOCIN) IVPB 750 mg/150 ml premix  Status:  Discontinued     750 mg 150 mL/hr over 60 Minutes Intravenous Every 12 hours 08/30/15 1138 09/05/15 1352   08/30/15 0900  piperacillin-tazobactam (ZOSYN) IVPB 3.375 g  Status:  Discontinued     3.375 g 12.5 mL/hr over 240 Minutes Intravenous Every 8 hours 08/30/15 0757 09/06/15 0916       ASSESSMENT / PLAN:   RLL Airspace Disease / Concern for HCAP on eval early nov 2016. Repeat eval 09/14/15 - apepars to having liquefying necrosis with purulent chest tube drainage  Plan: Vanco 11/1 >> and Zosyn 11/1 >>continue augmentin - total 3 weeks from original start date -> can be extended depending on fu at onc/cvts Repeat CT chest early dec 2016 befor seeing pulmonary on 10/03/15 Intermittent CXR Mucinex  Pulmonary hygiene: IS, mobilize as able  Add flutter valve    Acute on Chronic Hypoxic Respiratory Failure - in the setting of RLL airspace disease, atx with splinting, effusion and underlying COPD   Plan: Will need ambulatory desaturation assessment prior to discharge O2 as needed to support sats 88-95% Control pain to allow for coughing / pulmonary hygiene    Right Pneumothorax - s/p chest tube  placement, attempt at endobronchial valve placement   Plan: CT per CVTS Water seal  Will eventually need pleurodesis but likely will have to wait till ifnection clears  Presumed COPD Tobacco Abuse    Plan: Smoking cessation counseling  Continue symbicort PRN albuterol  OPD fu Dr Chase Caller APp Tammy Parrett -10/03/15 (idealy get CT chest before then) Will set up pulm rehab opd then  Pain - chest tube and malignancy associated  Plan: Discussed ultram as first line, then oxy for moderate and dilaudid for severe pain with patient    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   Plan: Per ONC   At Risk Protein Calorie Malnutrition - in the setting of malignancy   Plan: Diet education discussed with patient   Nutrition consult    Physical [place] PT if possible  PCCM will sign off  Future Appointments Date Time Provider [place]  09/19/2015 1:15 PM [facility] LAB 5 [facility]  None  09/19/2015 1:45 PM Curt Bears, MD [facility] None  09/19/2015 2:15 PM [facility] B6 [facility] None  09/20/2015 3:30 PM Grace Isaac, MD [facility]  09/26/2015 2:30 PM [facility] LAB 4 [facility] None  09/26/2015 3:00 PM [facility] B5 [facility] None  10/03/2015 11:15 AM Tammy Jeralene Huff, NP [facility] None     Dr. Brand Males, M.D., [facility].C.P Pulmonary and Critical Care Medicine Staff Physician [facility] Pager: [phone], If no answer or between  15:00h - 7:00h: call [phone]  09/14/2015 9:22 AM

## 2015-09-14 NOTE — Progress Notes (Signed)
Pt. Discharged to home  Pt. D/C'd via wheelchair from guest services and with son and daughter Discharge information reviewed and given All personal belongings given to Pt. Carried by daughter Education discussed and read back  IV was d/c

## 2015-09-15 ENCOUNTER — Other Ambulatory Visit: Payer: Self-pay | Admitting: *Deleted

## 2015-09-15 ENCOUNTER — Inpatient Hospital Stay: Payer: 59 | Admitting: Adult Health

## 2015-09-15 DIAGNOSIS — B370 Candidal stomatitis: Secondary | ICD-10-CM

## 2015-09-15 MED ORDER — MAGIC MOUTHWASH: 5.0000 mL | Freq: Three times a day (TID) | ORAL | Status: DC

## 2015-09-15 NOTE — Telephone Encounter (Signed)
LVM for patient to return call. 

## 2015-09-15 NOTE — Telephone Encounter (Signed)
Pt returned call. He stated that he spoke with MR and was scheduled for a CT on 09/30/15 at 9:00am and an ov with TP on 10/03/15 at 11:30am. Details in phone note from 09/14/15. Pt voiced understanding and had no further questions. Nothing further questions.

## 2015-09-16 ENCOUNTER — Telehealth: Payer: Self-pay | Admitting: *Deleted

## 2015-09-16 NOTE — Telephone Encounter (Addendum)
Pt called back and said travelling would be difficult to make appt on Monday and he would like to r/s. I told him I will cancel appts  and request to r/s. appts cancelled.

## 2015-09-16 NOTE — Telephone Encounter (Signed)
I called back pt phone and left message to keep appts Monday, but that he may not get chemo.

## 2015-09-16 NOTE — Telephone Encounter (Signed)
Received call from [place], [place] @ [facility] re:  Pt asked nurse to call office to see if pt still needs to come in on 09/19/15.  Pt was discharged from the hospital on 09/14/15 with chest tube and on antibiotics.  Pt would like to know if he should keep office visit appt as scheduled.  Pt also was concerned about getting chemo this soon.   Attempted to call pt back unsuccessfully.  Left message on voice mail requesting a call back from pt. Pt's   Phone    [phone]. Kaitlyn's  Phone    [phone].

## 2015-09-19 ENCOUNTER — Other Ambulatory Visit: Payer: 59

## 2015-09-19 ENCOUNTER — Ambulatory Visit: Payer: 59 | Admitting: Internal Medicine

## 2015-09-19 ENCOUNTER — Ambulatory Visit: Payer: 59

## 2015-09-19 ENCOUNTER — Telehealth: Payer: Self-pay | Admitting: Internal Medicine

## 2015-09-19 ENCOUNTER — Encounter: Payer: Self-pay | Admitting: *Deleted

## 2015-09-19 ENCOUNTER — Other Ambulatory Visit: Payer: Self-pay | Admitting: Medical Oncology

## 2015-09-19 ENCOUNTER — Other Ambulatory Visit: Payer: Self-pay | Admitting: Cardiothoracic Surgery

## 2015-09-19 DIAGNOSIS — C3490 Malignant neoplasm of unspecified part of unspecified bronchus or lung: Secondary | ICD-10-CM

## 2015-09-19 DIAGNOSIS — C3491 Malignant neoplasm of unspecified part of right bronchus or lung: Secondary | ICD-10-CM

## 2015-09-19 NOTE — Telephone Encounter (Signed)
lvm fo rpt regarding to DEc appt.Marland KitchenMarland KitchenMarland KitchenMarland Kitchenpt ok and aware

## 2015-09-20 ENCOUNTER — Other Ambulatory Visit: Payer: Self-pay | Admitting: Physician Assistant

## 2015-09-20 ENCOUNTER — Other Ambulatory Visit: Payer: Self-pay | Admitting: *Deleted

## 2015-09-20 ENCOUNTER — Encounter: Payer: Self-pay | Admitting: Cardiothoracic Surgery

## 2015-09-20 ENCOUNTER — Ambulatory Visit (INDEPENDENT_AMBULATORY_CARE_PROVIDER_SITE_OTHER): Payer: 59 | Admitting: Cardiothoracic Surgery

## 2015-09-20 ENCOUNTER — Ambulatory Visit
Admission: RE | Admit: 2015-09-20 | Discharge: 2015-09-20 | Disposition: A | Discharge status: Home / Self Care | Payer: 59 | Source: Ambulatory Visit | Attending: Cardiothoracic Surgery | Admitting: Cardiothoracic Surgery

## 2015-09-20 VITALS — BP 146/77 | HR 115 | Resp 18 | Ht 70.0 in | Wt 135.0 lb

## 2015-09-20 DIAGNOSIS — C3491 Malignant neoplasm of unspecified part of right bronchus or lung: Secondary | ICD-10-CM

## 2015-09-20 DIAGNOSIS — J9382 Other air leak: Secondary | ICD-10-CM | POA: Diagnosis not present

## 2015-09-20 DIAGNOSIS — C3490 Malignant neoplasm of unspecified part of unspecified bronchus or lung: Secondary | ICD-10-CM

## 2015-09-20 DIAGNOSIS — B999 Unspecified infectious disease: Secondary | ICD-10-CM

## 2015-09-20 DIAGNOSIS — J939 Pneumothorax, unspecified: Secondary | ICD-10-CM | POA: Diagnosis not present

## 2015-09-20 DIAGNOSIS — IMO0002 Reserved for concepts with insufficient information to code with codable children: Secondary | ICD-10-CM

## 2015-09-20 DIAGNOSIS — Z938 Other artificial opening status: Secondary | ICD-10-CM

## 2015-09-20 MED ORDER — AMOXICILLIN-POT CLAVULANATE 875-125 MG PO TABS: 1.0000 | ORAL_TABLET | Freq: Two times a day (BID) | ORAL | Status: DC

## 2015-09-20 NOTE — Progress Notes (Signed)
[address]       [place] [address]             [phone]      Gabriel [name] Medical Record #[id number] Date of Birth: Apr 12, 1941  Referring: Curt Bears, MD Primary Care: Cammy Copa, MD  Chief Complaint:   Chest tube placement  History of Present Illness:     retuns to office after recent d/c from hospital after being treated for sob, present air leak and chest tube in place. His LH has completely stopped these had very minimal drainage from his chest tube since discharge home   Past Medical History  Diagnosis Date  . Spontaneous pneumothorax MANY YRS AGO AND AGAIN 04/04/15    HISTORY OF (ONLY)  . Cancer ([place])     skin cancer  . Lung mass     right / HAS HAD RADIATION AND CHEMO FOR LUNG CANCER  . Inguinal hernia   . Productive cough     "DUE TO RADIATION"  . History of nonmelanoma skin cancer   . Constipation     AFTER ANESTHESIA  . Difficulty sleeping   . Radiation 05/11/15-06/21/15    NSCLCA/right lung  . Pneumothorax on right 08/24/2015     History  Smoking status  . Current Every Day Smoker -- 0.50 packs/day for 55 years  . Types: Cigarettes  Smokeless tobacco  . Never Used    History  Alcohol Use  . Yes    Comment: every other night -social     Allergies  Allergen Reactions  . Aleve [Naproxen] Rash    Current Outpatient Prescriptions  Medication Sig Dispense Refill  . acetaminophen (TYLENOL) 325 MG tablet Take 2 tablets (650 mg total) by mouth every 6 (six) hours as needed for mild pain, moderate pain, fever or headache (or Fever >/= 101).    Marland Kitchen albuterol (PROVENTIL HFA;VENTOLIN HFA) 108 (90 BASE) MCG/ACT inhaler Inhale 1-2 puffs into the lungs every 6 (six) hours as needed for wheezing or shortness of breath. 1 Inhaler 0  . ALPRAZolam (XANAX) 0.5 MG tablet Take 1 tablet (0.5 mg total) by mouth 3 (three) times daily as needed for anxiety. 20 tablet 1  . amoxicillin-clavulanate (AUGMENTIN) 875-125  MG tablet Take 1 tablet by mouth every 12 (twelve) hours. 13 tablet 0  . Ascorbic Acid (VITAMIN C) 1000 MG tablet Take 1,000 mg by mouth daily.    Marland Kitchen b complex vitamins tablet Take 1 tablet by mouth daily.    . budesonide-formoterol (SYMBICORT) 160-4.5 MCG/ACT inhaler Inhale 2 puffs into the lungs 2 (two) times daily. 1 Inhaler 0  . CALCIUM CITRATE PO Take 1 tablet by mouth daily.    . Cyanocobalamin (VITAMIN B 12 PO) Take 1 tablet by mouth daily.    Marland Kitchen guaiFENesin (MUCINEX) 600 MG 12 hr tablet Take 1 tablet (600 mg total) by mouth 2 (two) times daily as needed.    Marland Kitchen L-Arginine 1000 MG TABS Take 1,000 mg by mouth daily.    . magic mouthwash SOLN Take 5 mLs by mouth 3 (three) times daily. 1 tsp swish and swallow three times a day 120 mL 1  . magnesium 30 MG tablet Take 30 mg by mouth daily.    . Multiple Vitamin (MULTI VITAMIN DAILY PO) Take 1 tablet by mouth daily.    . Omega-3 Fatty Acids (FISH OIL) 1000 MG CAPS Take 1,000 mg by mouth daily.    Marland Kitchen oxyCODONE (OXY IR/ROXICODONE)  5 MG immediate release tablet Take 1 tablet (5 mg total) by mouth every 6 (six) hours as needed for severe pain. 50 tablet 0  . saccharomyces boulardii (FLORASTOR) 250 MG capsule Take 250 mg by mouth 2 (two) times daily.    . vitamin A 10000 UNIT capsule Take 10,000 Units by mouth daily.     No current facility-administered medications for this visit.       Physical Exam: BP 146/77 mmHg  Pulse 115  Resp 18  Ht '5\' 10"'$  (1.778 m)  Wt 135 lb (61.236 kg)  BMI 19.37 kg/m2  SpO2 92%  General appearance: alert and cooperative Neurologic: intact Heart: regular rate and rhythm, S1, S2 normal, no murmur, click, rub or gallop Lungs: diminished breath sounds RLL and RML Abdomen: soft, non-tender; bowel sounds normal; no masses,  no organomegaly Extremities: extremities normal, atraumatic, no cyanosis or edema and Homans sign is negative, no sign of DVT Wound: Chest tube is present, there is no airleak Chest tube was  removed in the office  Diagnostic Studies & Laboratory data:     Recent Radiology Findings:  Dg Chest 2 View  09/20/2015  CLINICAL DATA:  History of lung carcinoma, chest tube insertion EXAM: CHEST  2 VIEW COMPARISON:  Portable chest x-ray of 09/14/2015 and 09/13/2015 FINDINGS: Aeration of the right lung has improved with a right chest tube present. Pleural and parenchymal opacities remain throughout the right hemi thorax with multiple right lung bulla again noted. The tip of the right chest tube coils at the right lung apex. The left lung is clear. Heart size is stable. IMPRESSION: Somewhat improved aeration of the right lung with right chest tube present. Electronically Signed   By: Ivar Drape M.D.   On: 09/20/2015 14:48    Dg Chest 2 View  08/23/2015  CLINICAL DATA:  History of lung malignancy, persistent right-sided pneumothorax treated with indwelling chest tube, increase shortness of breath today with exertion EXAM: CHEST  2 VIEW COMPARISON:  PA and lateral chest x-ray of August 18, 2015 FINDINGS: The pneumothorax on the right is again demonstrated and it has increased slightly in volume. It now amounts to approximately 50% of the lung volume. A small amount of pleural fluid persists at the right lung base. The right-sided chest tube tip projects over the medial aspect of the posterior tenth rib. The left lung is well-expanded. The interstitial markings are coarse. The heart and pulmonary vascularity are normal. IMPRESSION: Approximately 50% hydropneumothorax on the right which is mildly increased in volume since the previous study. The right-sided chest tube is unchanged in position. These results were called by telephone at the time of interpretation on 08/23/2015 at 3:41 pm to Marlana Latus, RN, , who verbally acknowledged these results. Electronically Signed   By: David  [place] M.D.   On: 08/23/2015 15:42      Recent Lab Findings: Lab Results  Component Value Date   WBC 11.3* 09/12/2015    HGB 8.9* 09/12/2015   HCT 27.8* 09/12/2015   PLT 300 09/12/2015   GLUCOSE 97 09/12/2015   ALT 33 09/02/2015   AST 28 09/02/2015   NA 134* 09/12/2015   K 3.8 09/12/2015   CL 102 09/12/2015   CREATININE 0.79 09/13/2015   BUN 14 09/12/2015   CO2 26 09/12/2015   INR 1.10 08/01/2015      Assessment / Plan:     There has been some overall better aeration of the right lung, but still significant airspace disease on  the right, the right chest tube is removed in the office today The patient will continue with his Augmentin for total of 13 more pills, 6 days. He continues on home oxygen A CT scan of the chest is scheduled for December 2, this will evaluate if he's had any progression of malignancy. Overall since his discharge home he has improved, slightly more active and feels better I plan to see him back in one week Patient's and his family was given instructions to care for the chest tube site to leave the occlusive dressing on for 3 days and then he could be removed.      [name]      [address] [place] [address] Office [phone]   Beeper [phone]  09/20/2015 3:56 PM

## 2015-09-26 ENCOUNTER — Other Ambulatory Visit: Payer: 59

## 2015-09-26 ENCOUNTER — Ambulatory Visit: Payer: 59

## 2015-09-27 ENCOUNTER — Ambulatory Visit
Admission: RE | Admit: 2015-09-27 | Discharge: 2015-09-27 | Disposition: A | Discharge status: Home / Self Care | Payer: 59 | Source: Ambulatory Visit | Attending: Cardiothoracic Surgery | Admitting: Cardiothoracic Surgery

## 2015-09-27 ENCOUNTER — Encounter: Payer: Self-pay | Admitting: Cardiothoracic Surgery

## 2015-09-27 ENCOUNTER — Other Ambulatory Visit: Payer: Self-pay | Admitting: *Deleted

## 2015-09-27 ENCOUNTER — Ambulatory Visit (INDEPENDENT_AMBULATORY_CARE_PROVIDER_SITE_OTHER): Payer: 59 | Admitting: Cardiothoracic Surgery

## 2015-09-27 VITALS — BP 137/78 | HR 108 | Resp 20 | Ht 70.0 in | Wt 135.0 lb

## 2015-09-27 DIAGNOSIS — J939 Pneumothorax, unspecified: Secondary | ICD-10-CM

## 2015-09-27 DIAGNOSIS — J9382 Other air leak: Secondary | ICD-10-CM | POA: Diagnosis not present

## 2015-09-27 DIAGNOSIS — Z938 Other artificial opening status: Secondary | ICD-10-CM | POA: Diagnosis not present

## 2015-09-27 DIAGNOSIS — B999 Unspecified infectious disease: Secondary | ICD-10-CM | POA: Diagnosis not present

## 2015-09-27 DIAGNOSIS — IMO0002 Reserved for concepts with insufficient information to code with codable children: Secondary | ICD-10-CM

## 2015-09-27 DIAGNOSIS — Z4682 Encounter for fitting and adjustment of non-vascular catheter: Secondary | ICD-10-CM

## 2015-09-27 MED ORDER — AMOXICILLIN-POT CLAVULANATE 875-125 MG PO TABS: 1.0000 | ORAL_TABLET | Freq: Two times a day (BID) | ORAL | Status: DC

## 2015-09-27 NOTE — Progress Notes (Signed)
[address]       [place] [address]             [phone]      Paulette H Scarbro [facility] Medical Record #[id number] Date of Birth: [birth year]  Referring: Curt Bears, MD Primary Care: Cammy Copa, MD  Chief Complaint:   Chest tube placement  History of Present Illness:     retuns to office after recent d/c from hospital after being treated for sob, present air leak and chest tube in place. Last week long term chest tube was removed. Patient has been feeling better, two days ago had low grean temp to 100 none since . Was concerned this am because he heard some air at old chest tube site   Past Medical History  Diagnosis Date  . Spontaneous pneumothorax MANY YRS AGO AND AGAIN 04/04/15    HISTORY OF (ONLY)  . Cancer ([facility])     skin cancer  . Lung mass     right / HAS HAD RADIATION AND CHEMO FOR LUNG CANCER  . Inguinal hernia   . Productive cough     "DUE TO RADIATION"  . History of nonmelanoma skin cancer   . Constipation     AFTER ANESTHESIA  . Difficulty sleeping   . Radiation 05/11/15-06/21/15    NSCLCA/right lung  . Pneumothorax on right 08/24/2015     History  Smoking status  . Current Every Day Smoker -- 0.50 packs/day for 55 years  . Types: Cigarettes  Smokeless tobacco  . Never Used    History  Alcohol Use  . Yes    Comment: every other night -social     Allergies  Allergen Reactions  . Aleve [Naproxen] Rash    Current Outpatient Prescriptions  Medication Sig Dispense Refill  . acetaminophen (TYLENOL) 325 MG tablet Take 2 tablets (650 mg total) by mouth every 6 (six) hours as needed for mild pain, moderate pain, fever or headache (or Fever >/= 101).    Marland Kitchen albuterol (PROVENTIL HFA;VENTOLIN HFA) 108 (90 BASE) MCG/ACT inhaler Inhale 1-2 puffs into the lungs every 6 (six) hours as needed for wheezing or shortness of breath. 1 Inhaler 0  . ALPRAZolam (XANAX) 0.5 MG tablet Take 1 tablet (0.5 mg total) by mouth 3 (three)  times daily as needed for anxiety. 20 tablet 1  . Ascorbic Acid (VITAMIN C) 1000 MG tablet Take 1,000 mg by mouth daily.    Marland Kitchen b complex vitamins tablet Take 1 tablet by mouth daily.    . budesonide-formoterol (SYMBICORT) 160-4.5 MCG/ACT inhaler Inhale 2 puffs into the lungs 2 (two) times daily. 1 Inhaler 0  . CALCIUM CITRATE PO Take 1 tablet by mouth daily.    . Cyanocobalamin (VITAMIN B 12 PO) Take 1 tablet by mouth daily.    Marland Kitchen guaiFENesin (MUCINEX) 600 MG 12 hr tablet Take 1 tablet (600 mg total) by mouth 2 (two) times daily as needed.    Marland Kitchen L-Arginine 1000 MG TABS Take 1,000 mg by mouth daily.    . magic mouthwash SOLN Take 5 mLs by mouth 3 (three) times daily. 1 tsp swish and swallow three times a day 120 mL 1  . magnesium 30 MG tablet Take 30 mg by mouth daily.    . Multiple Vitamin (MULTI VITAMIN DAILY PO) Take 1 tablet by mouth daily.    . Omega-3 Fatty Acids (FISH OIL) 1000 MG CAPS Take 1,000 mg by mouth daily.    Marland Kitchen  oxyCODONE (OXY IR/ROXICODONE) 5 MG immediate release tablet Take 1 tablet (5 mg total) by mouth every 6 (six) hours as needed for severe pain. 50 tablet 0  . saccharomyces boulardii (FLORASTOR) 250 MG capsule Take 250 mg by mouth 2 (two) times daily.    . vitamin A 10000 UNIT capsule Take 10,000 Units by mouth daily.    Marland Kitchen amoxicillin-clavulanate (AUGMENTIN) 875-125 MG tablet Take 1 tablet by mouth every 12 (twelve) hours. (Patient not taking: Reported on 09/27/2015) 13 tablet 0   No current facility-administered medications for this visit.       Physical Exam: BP 137/78 mmHg  Pulse 108  Resp 20  Ht '5\' 10"'$  (1.778 m)  Wt 135 lb (61.236 kg)  BMI 19.37 kg/m2  SpO2 91%  General appearance: alert and cooperative Neurologic: intact Heart: regular rate and rhythm, S1, S2 normal, no murmur, click, rub or gallop Lungs: diminished breath sounds RLL and RML Abdomen: soft, non-tender; bowel sounds normal; no masses,  no organomegaly Extremities: extremities normal,  atraumatic, no cyanosis or edema and Homans sign is negative, no sign of DVT Wound: no air leak from chest tube site , no infection around site  Chest tube was removed in the office last week.   Diagnostic Studies & Laboratory data:     Recent Radiology Findings:  Dg Chest 2 View  09/27/2015  CLINICAL DATA:  Chest tube removal 1 week ago, history of an air leak ; spontaneous pneumothorax in June and not toe were of 2016, history of lung malignancy on the right, improving shortness of breath. EXAM: CHEST  2 VIEW COMPARISON:  PA and lateral chest x-ray of September 20, 2015 FINDINGS: Since removal of the right-sided chest tube no re-accumulation of the pneumothorax has occurred. There remains pleural thickening and significant parenchymal consolidation and volume loss on the right. The left lung is hyperinflated without acute abnormality. The cardiac silhouette is top-normal in size. Mild mediastinal shift toward the right is stable. There is no left pleural effusion. There are old fractures of the lateral aspects of the left 7-9th ribs. IMPRESSION: There is no evidence of reaccumulation of a pneumothorax on the right. There are stable chronic pleuroparenchymal abnormalities on the right with compensatory hyperinflation of the left lung. Electronically Signed   By: David  Martinique M.D.   On: 09/27/2015 12:54    Dg Chest 2 View  08/23/2015  CLINICAL DATA:  History of lung malignancy, persistent right-sided pneumothorax treated with indwelling chest tube, increase shortness of breath today with exertion EXAM: CHEST  2 VIEW COMPARISON:  PA and lateral chest x-ray of August 18, 2015 FINDINGS: The pneumothorax on the right is again demonstrated and it has increased slightly in volume. It now amounts to approximately 50% of the lung volume. A small amount of pleural fluid persists at the right lung base. The right-sided chest tube tip projects over the medial aspect of the posterior tenth rib. The left lung is  well-expanded. The interstitial markings are coarse. The heart and pulmonary vascularity are normal. IMPRESSION: Approximately 50% hydropneumothorax on the right which is mildly increased in volume since the previous study. The right-sided chest tube is unchanged in position. These results were called by telephone at the time of interpretation on 08/23/2015 at 3:41 pm to Marlana Latus, RN, , who verbally acknowledged these results. Electronically Signed   By: David  Martinique M.D.   On: 08/23/2015 15:42      Recent Lab Findings: Lab Results  Component Value Date  WBC 11.3* 09/12/2015   HGB 8.9* 09/12/2015   HCT 27.8* 09/12/2015   PLT 300 09/12/2015   GLUCOSE 97 09/12/2015   ALT 33 09/02/2015   AST 28 09/02/2015   NA 134* 09/12/2015   K 3.8 09/12/2015   CL 102 09/12/2015   CREATININE 0.79 09/13/2015   BUN 14 09/12/2015   CO2 26 09/12/2015   INR 1.10 08/01/2015      Assessment / Plan:     There has been some overall better aeration of the right lung, but still significant airspace disease on the right,  The patient will continue with his Augmentin for total of 30  more pills,. He continues on home oxygen A CT scan of the chest is scheduled for December 2, this will evaluate if he's had any progression of malignancy. Overall since his discharge home he has improved, slightly more active and feels better Suspect he has had some drainage and air in old chest tube tract.Instructed n local wound care  Patient's and his family was given instructions to care for the chest tube site to leave the occlusive dressing on for 3 days and then he could be removed. Will see back Dec8 or prn     [name]      [address] [place] [address] Office [phone]   [place]eeper [phone]  09/27/2015 1:22 PM

## 2015-09-29 ENCOUNTER — Ambulatory Visit: Payer: 59 | Admitting: Cardiothoracic Surgery

## 2015-09-29 ENCOUNTER — Other Ambulatory Visit: Payer: Self-pay | Admitting: *Deleted

## 2015-09-29 DIAGNOSIS — C3491 Malignant neoplasm of unspecified part of right bronchus or lung: Secondary | ICD-10-CM

## 2015-09-29 MED ORDER — OXYCODONE HCL 5 MG PO TABS: 5.0000 mg | ORAL_TABLET | Freq: Four times a day (QID) | ORAL | Status: DC | PRN

## 2015-09-29 NOTE — Progress Notes (Signed)
Mr. Brink son has called and requested a refill for Mr. Grupp's Oxycodone.  I called and informed him a new script would be ready for pick up from our office today.

## 2015-09-30 ENCOUNTER — Other Ambulatory Visit: Payer: Self-pay | Admitting: *Deleted

## 2015-09-30 ENCOUNTER — Ambulatory Visit (INDEPENDENT_AMBULATORY_CARE_PROVIDER_SITE_OTHER)
Admission: RE | Admit: 2015-09-30 | Discharge: 2015-09-30 | Disposition: A | Discharge status: Home / Self Care | Payer: 59 | Source: Ambulatory Visit | Attending: Internal Medicine | Admitting: Internal Medicine

## 2015-09-30 DIAGNOSIS — C3491 Malignant neoplasm of unspecified part of right bronchus or lung: Secondary | ICD-10-CM

## 2015-09-30 DIAGNOSIS — J449 Chronic obstructive pulmonary disease, unspecified: Secondary | ICD-10-CM

## 2015-09-30 MED ORDER — BUDESONIDE-FORMOTEROL FUMARATE 160-4.5 MCG/ACT IN AERO: 2.0000 | INHALATION_SPRAY | Freq: Two times a day (BID) | RESPIRATORY_TRACT | Status: DC

## 2015-09-30 MED ORDER — ALBUTEROL SULFATE HFA 108 (90 BASE) MCG/ACT IN AERS: 1.0000 | INHALATION_SPRAY | Freq: Four times a day (QID) | RESPIRATORY_TRACT | Status: DC | PRN

## 2015-10-03 ENCOUNTER — Encounter: Payer: Self-pay | Admitting: Adult Health

## 2015-10-03 ENCOUNTER — Ambulatory Visit (INDEPENDENT_AMBULATORY_CARE_PROVIDER_SITE_OTHER): Payer: 59 | Admitting: Adult Health

## 2015-10-03 ENCOUNTER — Telehealth: Payer: Self-pay | Admitting: Adult Health

## 2015-10-03 VITALS — BP 124/78 | HR 77 | Temp 97.7°F | Ht 70.0 in | Wt 124.0 lb

## 2015-10-03 DIAGNOSIS — J189 Pneumonia, unspecified organism: Secondary | ICD-10-CM | POA: Diagnosis not present

## 2015-10-03 DIAGNOSIS — J939 Pneumothorax, unspecified: Secondary | ICD-10-CM | POA: Diagnosis not present

## 2015-10-03 DIAGNOSIS — C3491 Malignant neoplasm of unspecified part of right bronchus or lung: Secondary | ICD-10-CM

## 2015-10-03 DIAGNOSIS — J449 Chronic obstructive pulmonary disease, unspecified: Secondary | ICD-10-CM

## 2015-10-03 MED ORDER — ALBUTEROL SULFATE (2.5 MG/3ML) 0.083% IN NEBU
2.5000 mg | INHALATION_SOLUTION | Freq: Four times a day (QID) | RESPIRATORY_TRACT | Status: DC | PRN
Start: 2015-10-03 — End: 2015-10-05

## 2015-10-03 NOTE — Progress Notes (Signed)
Subjective:    Patient ID: Gabriel [name], male    DOB: 1941-06-27, 74 y.o.   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 Inda Merlin. Recent admission for recurrent PTX in Oct 2016    10/03/2015 [facility] follow up : PTX and lung cancer  Pt was recently admitted x 3 to hospital for persistent  PTX despite chest tube,  HCAP , and COPD exacerbation.  He was seen by Cardiothoracic surgery with chest tube placement . He was treated with abx/ with augmentin for 14 days. CT chest 11/1 showed dense consolidation in RUL/RML/RLL , stable left sided lung nodules. He was seen by [facility]CTS on 11/22 with chest tube removal . Seen again on 11/29 with cxr showing sign right sided aspdz , he was continued on Augmentin for additional 2 weeks.  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 And enlarging left sided lesions . Moderate emphysema.  He says since discharge he is slowly feeling better but very weak. Trying to walk some each day.  Has congested cough, still getting up thick yellow brown mucus.  Remains on O2 at 3l/m .  He has a follow up with [facility]CTS on 12/8 and Oncology tomorrow.  He denies hemoptysis , orthopnea , edema . Has seen an occasion low grade fever.  Does have right sided chest wall pain , takes pain meds for this.      Past Medical History  Diagnosis Date  . Spontaneous pneumothorax MANY YRS AGO AND AGAIN 04/04/15    HISTORY OF (ONLY)  . Cancer ([facility])     skin cancer  . Lung mass     right / HAS HAD RADIATION AND CHEMO FOR LUNG CANCER  . Inguinal hernia   . Productive cough     "DUE TO RADIATION"  . History of nonmelanoma skin cancer   . Constipation     AFTER ANESTHESIA  . Difficulty sleeping   . Radiation 05/11/15-06/21/15    NSCLCA/right lung  .  Pneumothorax on right 08/24/2015   Current Outpatient Prescriptions on File Prior to Visit  Medication Sig Dispense Refill  . acetaminophen (TYLENOL) 325 MG tablet Take 2 tablets (650 mg total) by mouth every 6 (six) hours as needed for mild pain, moderate pain, fever or headache (or Fever >/= 101).    Marland Kitchen albuterol (PROVENTIL HFA;VENTOLIN HFA) 108 (90 BASE) MCG/ACT inhaler Inhale 1-2 puffs into the lungs every 6 (six) hours as needed for wheezing or shortness of breath. 1 Inhaler 1  . ALPRAZolam (XANAX) 0.5 MG tablet Take 1 tablet (0.5 mg total) by mouth 3 (three) times daily as needed for anxiety. 20 tablet 1  . amoxicillin-clavulanate (AUGMENTIN) 875-125 MG tablet Take 1 tablet by mouth every 12 (twelve) hours. 30 tablet 0  . Ascorbic Acid (VITAMIN C) 1000 MG tablet Take 1,000 mg by mouth daily.    Marland Kitchen b complex vitamins tablet Take 1 tablet by mouth daily.    . budesonide-formoterol (SYMBICORT) 160-4.5 MCG/ACT inhaler Inhale 2 puffs into the lungs 2 (two) times daily. 1 Inhaler 1  . CALCIUM CITRATE PO Take 1 tablet by mouth daily.    . Cyanocobalamin (VITAMIN B 12 PO) Take 1 tablet by mouth daily.    Marland Kitchen guaiFENesin (MUCINEX) 600  MG 12 hr tablet Take 1 tablet (600 mg total) by mouth 2 (two) times daily as needed.    Marland Kitchen L-Arginine 1000 MG TABS Take 1,000 mg by mouth daily.    . magic mouthwash SOLN Take 5 mLs by mouth 3 (three) times daily. 1 tsp swish and swallow three times a day 120 mL 1  . magnesium 30 MG tablet Take 30 mg by mouth daily.    . Multiple Vitamin (MULTI VITAMIN DAILY PO) Take 1 tablet by mouth daily.    . Omega-3 Fatty Acids (FISH OIL) 1000 MG CAPS Take 1,000 mg by mouth daily.    Marland Kitchen oxyCODONE (OXY IR/ROXICODONE) 5 MG immediate release tablet Take 1 tablet (5 mg total) by mouth every 6 (six) hours as needed for severe pain. 50 tablet 0  . saccharomyces boulardii (FLORASTOR) 250 MG capsule Take 250 mg by mouth 2 (two) times daily.    . vitamin A 10000 UNIT capsule Take 10,000 Units  by mouth daily.     No current facility-administered medications on file prior to visit.     Review of Systems Constitutional:   No  weight loss, night sweats,  Fevers, chills, + fatigue, or  lassitude.  HEENT:   No headaches,  Difficulty swallowing,  Tooth/dental problems, or  Sore throat,                No sneezing, itching, ear ache, nasal congestion, post nasal drip,   CV:  No chest pain,  Orthopnea, PND, swelling in lower extremities, anasarca, dizziness, palpitations, syncope.   GI  No heartburn, indigestion, abdominal pain, nausea, vomiting, diarrhea, change in bowel habits, loss of appetite, bloody stools.   Resp:    No chest wall deformity  Skin: no rash or lesions.  GU: no dysuria, change in color of urine, no urgency or frequency.  No flank pain, no hematuria   MS:  No joint pain or swelling.  No decreased range of motion.  No back pain.  Psych:  No change in mood or affect. No depression or anxiety.  No memory loss.         Objective:   Physical Exam GEN: A/Ox3; pleasant , NAD, thin and frail , on O2   HEENT:  [place]/AT,  EACs-clear, TMs-wnl, NOSE-clear, THROAT-clear, no lesions, no postnasal drip or exudate noted.   NECK:  Supple w/ fair ROM; no JVD; normal carotid impulses w/o bruits; no thyromegaly or nodules palpated; no lymphadenopathy.  RESP  Decreased BS on right , no accessory muscle use, no dullness to percussion  CARD:  RRR, no m/r/g  , no peripheral edema, pulses intact, no cyanosis or clubbing.  GI:   Soft & nt; nml bowel sounds; no organomegaly or masses detected.  Musco: Warm bil, no deformities or joint swelling noted.   Neuro: alert, no focal deficits noted.    Skin: Warm, no lesions or rashes    CT chest 09/30/15  Right-sided fibrothorax. 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 2. Worsening chronic consolidation and collapse in the right lung, with extensive nodular septal thickening, and persistent right infrahilar fullness in the central aspect of the right lower lobe, right hilar fullness and mediastinal adenopathy, as above, suggesting residual neoplasm with right hilar and mediastinal lymphadenopathy. 3. Enlarging sub solid lesions throughout the left  lung, as above, concerning for multicentric adenocarcinoma.      Assessment & Plan:

## 2015-10-03 NOTE — Telephone Encounter (Signed)
Patient Instructions     Finish Augmentin .  Sputum culture today .  Continue on Symbicort 2 puffs Twice daily  May use Albuterol Neb every 6hr As needed Shortness of breathing/wheezing.  Follow up with Dr. Servando Snare and Dr. Julien Nordmann this week As planned  Please contact office for sooner follow up if symptoms do not improve or worsen or seek emergency care  follow up Dr. Chase Caller in 6 weeks and As needed     Patient was seen today with TP. He was unable to make 6 week follow up with MR at ov due to availability. Spoke with TP and it is okay to schedule him with any provider that has availability in 6 weeks. I LVM for patient to return call to make appointment.

## 2015-10-03 NOTE — Assessment & Plan Note (Signed)
Cont on Symbicort  Can add BD neb As needed  W/ albuterol

## 2015-10-03 NOTE — Patient Instructions (Signed)
Finish Augmentin .  Sputum culture today .  Continue on Symbicort 2 puffs Twice daily   May use Albuterol Neb every 6hr As needed  Shortness of breathing/wheezing.  Follow up with Dr. Servando Snare and Dr. Julien Nordmann this week  As planned  Please contact office for sooner follow up if symptoms do not improve or worsen or seek emergency care  follow up Dr. Chase Caller in 6 weeks and As needed

## 2015-10-03 NOTE — Assessment & Plan Note (Signed)
Recurrent right sided PTX requiring prolonged chest tube .  CT chest is concerning for right sided fibrothorax and chronic BPF .  He is to see TCTS later this week .  Cont on o2

## 2015-10-03 NOTE — Assessment & Plan Note (Signed)
HCAP with progressive areas along area of previoius known cancer /mass, radiation and w/ large PTX requiring prolonged chest tube w/ probable BPF . CT shows right sided firbrothorax.  Will cont abx as directed for now  Check sputum cx as immunosuppressed .  follow up cxr in few weeks and follow clinical status  Case discussed with scan reviewed with Dr. Chase Caller

## 2015-10-03 NOTE — Assessment & Plan Note (Signed)
Follow up with Oncology tomorrow as planned CT chest was reviewed ? Progressive dz noted.

## 2015-10-04 ENCOUNTER — Other Ambulatory Visit (HOSPITAL_BASED_OUTPATIENT_CLINIC_OR_DEPARTMENT_OTHER): Payer: 59

## 2015-10-04 ENCOUNTER — Ambulatory Visit (HOSPITAL_BASED_OUTPATIENT_CLINIC_OR_DEPARTMENT_OTHER): Payer: 59 | Admitting: Internal Medicine

## 2015-10-04 ENCOUNTER — Telehealth: Payer: Self-pay | Admitting: Internal Medicine

## 2015-10-04 ENCOUNTER — Telehealth: Payer: Self-pay | Admitting: *Deleted

## 2015-10-04 ENCOUNTER — Encounter: Payer: Self-pay | Admitting: Internal Medicine

## 2015-10-04 VITALS — BP 132/66 | HR 90 | Temp 97.8°F | Resp 22 | Ht 70.0 in | Wt 123.6 lb

## 2015-10-04 DIAGNOSIS — C3491 Malignant neoplasm of unspecified part of right bronchus or lung: Secondary | ICD-10-CM

## 2015-10-04 DIAGNOSIS — R0602 Shortness of breath: Secondary | ICD-10-CM | POA: Diagnosis not present

## 2015-10-04 DIAGNOSIS — C3490 Malignant neoplasm of unspecified part of unspecified bronchus or lung: Secondary | ICD-10-CM | POA: Diagnosis not present

## 2015-10-04 DIAGNOSIS — R5383 Other fatigue: Secondary | ICD-10-CM

## 2015-10-04 DIAGNOSIS — R52 Pain, unspecified: Secondary | ICD-10-CM

## 2015-10-04 DIAGNOSIS — R531 Weakness: Secondary | ICD-10-CM

## 2015-10-04 DIAGNOSIS — F419 Anxiety disorder, unspecified: Secondary | ICD-10-CM

## 2015-10-04 LAB — CBC WITH DIFFERENTIAL/PLATELET
BASO%: 0.2 % (ref 0.0–2.0)
Basophils Absolute: 0 10*3/uL (ref 0.0–0.1)
EOS ABS: 0.1 10*3/uL (ref 0.0–0.5)
EOS%: 0.8 % (ref 0.0–7.0)
HEMATOCRIT: 31.6 % — AB (ref 38.4–49.9)
HEMOGLOBIN: 10.1 g/dL — AB (ref 13.0–17.1)
LYMPH#: 0.9 10*3/uL (ref 0.9–3.3)
LYMPH%: 8 % — ABNORMAL LOW (ref 14.0–49.0)
MCH: 27 pg — ABNORMAL LOW (ref 27.2–33.4)
MCHC: 32 g/dL (ref 32.0–36.0)
MCV: 84.5 fL (ref 79.3–98.0)
MONO#: 0.8 10*3/uL (ref 0.1–0.9)
MONO%: 6.9 % (ref 0.0–14.0)
NEUT%: 84.1 % — ABNORMAL HIGH (ref 39.0–75.0)
NEUTROS ABS: 9.8 10*3/uL — AB (ref 1.5–6.5)
Platelets: 407 10*3/uL — ABNORMAL HIGH (ref 140–400)
RBC: 3.74 10*6/uL — ABNORMAL LOW (ref 4.20–5.82)
RDW: 16.8 % — AB (ref 11.0–14.6)
WBC: 11.7 10*3/uL — AB (ref 4.0–10.3)

## 2015-10-04 LAB — COMPREHENSIVE METABOLIC PANEL
ALBUMIN: 2.4 g/dL — AB (ref 3.5–5.0)
ALK PHOS: 116 U/L (ref 40–150)
ALT: 19 U/L (ref 0–55)
AST: 21 U/L (ref 5–34)
Anion Gap: 10 mEq/L (ref 3–11)
BUN: 24.9 mg/dL (ref 7.0–26.0)
CO2: 28 meq/L (ref 22–29)
Calcium: 9.9 mg/dL (ref 8.4–10.4)
Chloride: 101 mEq/L (ref 98–109)
Creatinine: 0.9 mg/dL (ref 0.7–1.3)
EGFR: 79 mL/min/{1.73_m2} — ABNORMAL LOW (ref >90)
GLUCOSE: 113 mg/dL (ref 70–140)
Potassium: 4.1 mEq/L (ref 3.5–5.1)
SODIUM: 138 meq/L (ref 136–145)
TOTAL PROTEIN: 8.6 g/dL — AB (ref 6.4–8.3)

## 2015-10-04 MED ORDER — ALPRAZOLAM 0.5 MG PO TABS: 0.5000 mg | ORAL_TABLET | Freq: Three times a day (TID) | ORAL | Status: DC | PRN

## 2015-10-04 MED ORDER — OXYCODONE HCL 5 MG PO TABS: 5.0000 mg | ORAL_TABLET | Freq: Four times a day (QID) | ORAL | Status: DC | PRN

## 2015-10-04 MED ORDER — OXYCODONE HCL ER 20 MG PO T12A: 20.0000 mg | EXTENDED_RELEASE_TABLET | Freq: Two times a day (BID) | ORAL | Status: DC

## 2015-10-04 NOTE — Telephone Encounter (Signed)
-----   Message from Tana Coast sent at 10/03/2015  5:13 PM EST ----- Regarding: FW: Neb Machine See Melissa's response below    ----- Message -----    From: Darlina Guys    Sent: 10/03/2015   4:58 PM      To: Gala Murdoch Subject: RE: Sarajane Marek                                Hey this patient's script says that they need this med every 6 hours.  There is no way that [facility] pharmacy can get this med to the pt to start this right away.  I'm not positive that they can accept his commercial insurance. Does this need to go to the local pharmacy? ----- Message -----    From: Tana Coast    Sent: 10/03/2015   2:58 PM      To: Melissa Stenson Subject: [employer] order in the box per [facility] waiting on Rx for you  Thanks, Rodena Piety

## 2015-10-04 NOTE — Progress Notes (Signed)
[facility] [phone]   [phone]  OFFICE PROGRESS NOTE  Cammy Copa, MD [phone] [address] [place] [address]  DIAGNOSIS: Non-small cell carcinoma of right lung, stage 3  Staging form: Lung, AJCC 7th Edition  Clinical stage from 04/21/2015: Stage IIIA (T2a, N2, M0) - Signed by Curt Bears, MD on 04/21/2015  [facility] One molecular studies reveal that he was positive for [place], [place], and STAG2 [id number]. He was negative for: EGFR, ALK, BRAF, MET, RET, ERBB2 and ROS1  PRIOR THERAPY: Concurrent chemoradiation with chemotherapy in the form of weekly carboplatin for AUC 2 paclitaxel 45 mg/m given concurrent with radiation therapy. First cycle started 05/09/2015. Status post 7 cycles.  CURRENT THERAPY: Consolidation chemotherapy with carboplatin for AUC of 5 on day 1 and gemcitabine 1000 MG/M2 on days 1 and 8 every 3 weeks. First dose 08/15/2015.  INTERVAL HISTORY: Gabriel CHOHAN74y.o. male returns to the clinic today for follow-up visit accompanied by his daughter Gabriel Signsand [name] The patient was started on consolidation chemotherapy with carboplatin and gemcitabine status post 1 cycle given on 08/15/2015. [facility] treatment [facility] He had right chest tube placed by Dr. GServando Snareand his care leak is much better now. He continues to complain of increasing fatigue and weakness. He also has shortness of breath and currently on home oxygen. The patient lost a lot of weight over the last few weeks. He denied having any fever or chills. He has no nausea or vomiting. He is here today for evaluation and recommendation regarding treatment of his condition.  MEDICAL HISTORY: Past Medical History  Diagnosis Date  . Spontaneous pneumothorax MANY YRS AGO AND AGAIN 04/04/15    HISTORY OF (ONLY)  . [place]     [place]  cancer  . Lung mass     right / HAS HAD RADIATION AND CHEMO FOR LUNG CANCER  . Inguinal hernia   . Productive cough     "DUE TO RADIATION"  . History of nonmelanoma skin cancer   . Constipation     AFTER ANESTHESIA  . Difficulty sleeping   . Radiation 05/11/15-06/21/15    NSCLCA/right lung  . Pneumothorax on right 08/24/2015    ALLERGIES:  is allergic to aleve.  MEDICATIONS:  Current Outpatient Prescriptions  Medication Sig Dispense Refill  . acetaminophen (TYLENOL) 325 MG tablet Take 2 tablets (650 mg total) by mouth every 6 (six) hours as needed for mild pain, moderate pain, fever or headache (or Fever >/= 101).    .Marland KitchenALPRAZolam (XANAX) 0.5 MG tablet Take 1 tablet (0.5 mg total) by mouth 3 (three) times daily as needed for anxiety. 20 tablet 1  . Ascorbic Acid (VITAMIN C) 1000 MG tablet Take 1,000 mg by mouth daily.    .Marland Kitchenb complex vitamins tablet Take 1 tablet by mouth daily.    . budesonide-formoterol (SYMBICORT) 160-4.5 MCG/ACT inhaler Inhale 2 puffs into the lungs 2 (two) times daily. 1 Inhaler 1  . CALCIUM CITRATE PO Take 1 tablet by mouth daily.    . Cyanocobalamin (VITAMIN B 12 PO) Take 1 tablet by mouth daily.    .Marland KitchenguaiFENesin (MUCINEX) 600 MG 12 hr tablet Take 1 tablet (600 mg total) by mouth 2 (two) times daily as needed.    . magnesium 30 MG tablet Take 30 mg by mouth daily.    .Marland Kitchen  Multiple Vitamin (MULTI VITAMIN DAILY PO) Take 1 tablet by mouth daily.    . Omega-3 Fatty Acids (FISH OIL) 1000 MG CAPS Take 1,000 mg by mouth daily.    Marland Kitchen oxyCODONE (OXY IR/ROXICODONE) 5 MG immediate release tablet Take 1 tablet (5 mg total) by mouth every 6 (six) hours as needed for severe pain. 50 tablet 0  . saccharomyces boulardii (FLORASTOR) 250 MG capsule Take 250 mg by mouth 2 (two) times daily.    . vitamin A 10000 UNIT capsule Take 10,000 Units by mouth daily.    Marland Kitchen albuterol (PROVENTIL HFA;VENTOLIN HFA) 108 (90 BASE) MCG/ACT inhaler Inhale 1-2 puffs into the lungs every 6 (six) hours as  needed for wheezing or shortness of breath. (Patient not taking: Reported on 10/04/2015) 1 Inhaler 1  . albuterol (PROVENTIL) (2.5 MG/3ML) 0.083% nebulizer solution Take 3 mLs (2.5 mg total) by nebulization every 6 (six) hours as needed for wheezing or shortness of breath. (Patient not taking: Reported on 10/04/2015) 75 mL 5  . amoxicillin-clavulanate (AUGMENTIN) 875-125 MG tablet Take 1 tablet by mouth every 12 (twelve) hours. (Patient not taking: Reported on 10/04/2015) 30 tablet 0  . L-Arginine 1000 MG TABS Take 1,000 mg by mouth daily.    . magic mouthwash SOLN Take 5 mLs by mouth 3 (three) times daily. 1 tsp swish and swallow three times a day (Patient not taking: Reported on 10/04/2015) 120 mL 1   No current facility-administered medications for this visit.    SURGICAL HISTORY:  Past Surgical History  Procedure Laterality Date  . Hernia repair  2011  . Colonoscopy w/ biopsies and polypectomy    . Wisdom tooth extraction    . Tonsillectomy    . Video bronchoscopy with endobronchial ultrasound N/A 04/18/2015    Procedure: VIDEO BRONCHOSCOPY WITH ENDOBRONCHIAL ULTRASOUND;  Surgeon: Grace Isaac, MD;  Location: [facility];  Service: Thoracic;  Laterality: N/A;  . Inguinal hernia repair Left 07/15/2015    Procedure: OPEN LEFT INGUINAL HERNIA REPAIR;  Surgeon: Johnathan Hausen, MD;  Location: [facility];  Service: General;  Laterality: Left;  With MESH  . Video bronchoscopy with insertion of interbronchial valve (ibv) N/A 08/02/2015    Procedure: VIDEO BRONCHOSCOPY WITH ATTEMPTED INSERTION OF INTERBRONCHIAL VALVE (IBV) WITH FLUROSCOPY;  Surgeon: Grace Isaac, MD;  Location: [facility];  Service: Thoracic;  Laterality: N/A;  . Chest tube insertion Right 08/02/2015    Procedure: INSERTION OF SECOND RIGHT CHEST TUBE WITH FLUROSCOPY;  Surgeon: Grace Isaac, MD;  Location: [facility];  Service: Thoracic;  Laterality: Right;  . Chest tube insertion  07/28/2015    REVIEW OF SYSTEMS:  Constitutional: positive  for anorexia, fatigue and weight loss Eyes: negative Ears, nose, mouth, throat, and face: negative Respiratory: positive for cough, dyspnea on exertion and wheezing Cardiovascular: negative Gastrointestinal: negative Genitourinary:negative Integument/breast: negative Hematologic/lymphatic: negative Musculoskeletal:positive for muscle weakness Neurological: negative Behavioral/Psych: negative Endocrine: negative Allergic/Immunologic: negative   PHYSICAL EXAMINATION: General appearance: alert, cooperative and no distress Head: Normocephalic, without obvious abnormality, atraumatic Neck: no adenopathy, no JVD, supple, symmetrical, trachea midline and thyroid not enlarged, symmetric, no tenderness/mass/nodules Lymph nodes: Cervical, supraclavicular, and axillary nodes normal. Resp: clear to auscultation bilaterally Back: symmetric, no curvature. ROM normal. No CVA tenderness. Cardio: regular rate and rhythm, S1, S2 normal, no murmur, click, rub or gallop GI: soft, non-tender; bowel sounds normal; no masses,  no organomegaly Extremities: extremities normal, atraumatic, no cyanosis or edema Neurologic: Alert and oriented X 3, normal strength and tone. Normal  symmetric reflexes. Normal coordination and gait  ECOG PERFORMANCE STATUS: 1 - Symptomatic but completely ambulatory  Blood pressure 132/66, pulse 90, temperature 97.8 F (36.6 C), temperature source Oral, resp. rate 22, height _0  (1.778 m), weight 123 lb 9.6 oz (56.065 kg), SpO2 94 %.  LABORATORY DATA: Lab Results  Component Value Date   WBC 11.7* 10/04/2015   HGB 10.1* 10/04/2015   HCT 31.6* 10/04/2015   MCV 84.5 10/04/2015   PLT 407* 10/04/2015      Chemistry      Component Value Date/Time   NA 138 10/04/2015 1211   NA 134* 09/12/2015 0410   K 4.1 10/04/2015 1211   K 3.8 09/12/2015 0410   CL 102 09/12/2015 0410   CO2 28 10/04/2015 1211   CO2 26 09/12/2015 0410   BUN 24.9 10/04/2015 1211   BUN 14 09/12/2015  0410   CREATININE 0.9 10/04/2015 1211   CREATININE 0.79 09/13/2015 0457      Component Value Date/Time   CALCIUM 9.9 10/04/2015 1211   CALCIUM 8.0* 09/12/2015 0410   ALKPHOS 116 10/04/2015 1211   ALKPHOS 181* 09/02/2015 0950   AST 21 10/04/2015 1211   AST 28 09/02/2015 0950   ALT 19 10/04/2015 1211   ALT 33 09/02/2015 0950   BILITOT <0.30 10/04/2015 1211   BILITOT 1.2 09/02/2015 0950       RADIOGRAPHIC STUDIES: Dg Chest 2 View  09/27/2015  CLINICAL DATA:  Chest tube removal 1 week ago, history of an air leak ; spontaneous pneumothorax in June and not toe were of 2016, history of lung malignancy on the right, improving shortness of breath. EXAM: CHEST  2 VIEW COMPARISON:  PA and lateral chest x-ray of September 20, 2015 FINDINGS: Since removal of the right-sided chest tube no re-accumulation of the pneumothorax has occurred. There remains pleural thickening and significant parenchymal consolidation and volume loss on the right. The left lung is hyperinflated without acute abnormality. The cardiac silhouette is top-normal in size. Mild mediastinal shift toward the right is stable. There is no left pleural effusion. There are old fractures of the lateral aspects of the left 7-9th ribs. IMPRESSION: There is no evidence of reaccumulation of a pneumothorax on the right. There are stable chronic pleuroparenchymal abnormalities on the right with compensatory hyperinflation of the left lung. Electronically Signed   By: David  Martinique M.D.   On: 09/27/2015 12:54   Dg Chest 2 View  09/20/2015  CLINICAL DATA:  History of lung carcinoma, chest tube insertion EXAM: CHEST  2 VIEW COMPARISON:  Portable chest x-ray of 09/14/2015 and 09/13/2015 FINDINGS: Aeration of the right lung has improved with a right chest tube present. Pleural and parenchymal opacities remain throughout the right hemi thorax with multiple right lung bulla again noted. The tip of the right chest tube coils at the right lung apex. The  left lung is clear. Heart size is stable. IMPRESSION: Somewhat improved aeration of the right lung with right chest tube present. Electronically Signed   By: Ivar Drape M.D.   On: 09/20/2015 14:48   Dg Chest 2 View  09/08/2015  CLINICAL DATA:  Pneumothorax. EXAM: CHEST  2 VIEW COMPARISON:  09/06/2015. 09/04/2015. 08/31/2015.08/30/2015. 08/29/2015. Chest CT 08/30/2015. FINDINGS: Right chest tube in stable position. Mediastinum and hilar structures are unremarkable. Heart size stable. Severe multifocal right pulmonary infiltrates again noted. Multiple pulmonary blebs are noted. Persistent right apical bleb and/or tiny pneumothorax, no change from multiple prior exams. Tiny density left upper lobe most  likely a tiny focus of subsegmental atelectasis. Tiny nodule projected over left lung base most likely nipple shadow. Small right pleural effusion and/or pleural thickening. Old left rib fractures. IMPRESSION: 1. Right chest tube in stable position. 2. Multifocal persistent dense right lung infiltrates. Multiple pulmonary blebs are present. Tiny right apical bleb versus tiny apical pneumothorax again noted and unchanged from multiple prior exams. Electronically Signed   By: Marcello Moores  Register   On: 09/08/2015 07:44   Ct Chest Wo Contrast  09/30/2015  CLINICAL DATA:  74 year old male with history of pneumothorax. Followup after chest tube removal. 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 EXAM: CT CHEST WITHOUT CONTRAST TECHNIQUE: Multidetector CT imaging of the chest was performed following the standard protocol without IV contrast. COMPARISON:  Chest CT 08/30/2015. FINDINGS: Mediastinum/Lymph Nodes: Heart size is normal. Small amount of pericardial fluid and/or thickening, unlikely to be of any hemodynamic significance at this time. No associated pericardial calcification. There is atherosclerosis of the thoracic aorta, the great vessels of the mediastinum and the coronary arteries,  including calcified atherosclerotic plaque in the left main, left anterior descending, left circumflex and right coronary arteries. Calcifications of the aortic valve. Multiple borderline enlarged and mildly enlarged mediastinal lymph nodes are again noted, measuring up to 1.2 cm in short axis in the right paratracheal nodal station. There is also soft tissue fullness in the right hilar region, suggesting lymphadenopathy (poorly evaluated on today's noncontrast CT examination). Esophagus is unremarkable in appearance. No axillary lymphadenopathy. Lungs/Pleura: When compared to remote prior examination 07/22/2015, there is evidence of severe chronic volume loss in the right hemithorax, which is notably smaller than the contralateral side. There is chronic collapse and consolidation of much of the right lung, most severe in the right upper lobe, where there also appears to be extensive varicose and cystic bronchiectasis. Cavity in the apex of the right lung appears to communicate with a thick walled cavity in the apex of the right pleural space, best appreciated on image 13 of series 3. This thick-walled cavity in the right pleural space appears contiguous with a tract along the anterior aspect of the right pleural space, extending into the lower right hemithorax, which likely represents the old chest tube tract. Other than this small amount of pleural gas, there is no significant residual pneumothorax. Small chronic thick-walled right pleural effusion posteriorly. Previously demonstrated right middle and lower lobe lesions are now obscured, but as mentioned above, there is extensive soft tissue fullness in the right hilar region, and in the infrahilar aspect of the right lower lobe, which suggests residual neoplasm and/or adenopathy. Again noted is a large mass-like area of ground-glass attenuation, septal thickening and cystic change in the posterior aspect of the superior segment of the left lower lobe (image 30 of  series 3), which appears larger than prior examinations, currently measuring 3.1 x 2.4 cm. There is also a irregular ground-glass attenuation lesion with septal thickening in the periphery of the left upper lobe (image 16 of series 3) which measures 2.4 x 1.2 cm, and an irregular sub solid lesion with spiculated margins in the inferior aspect of the left lower lobe (image 52 of series 3) which measures 15 x 16 mm. Upper Abdomen: 3 mm calcification in the interpolar region of the right kidney may be vascular, or may be a nonobstructive calculus. Musculoskeletal/Soft Tissues: There are no aggressive appearing lytic or blastic lesions noted in the visualized portions of the skeleton. IMPRESSION: 1. Right-sided fibrothorax. 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 2. Worsening chronic consolidation and collapse in the right lung, with extensive nodular septal thickening, and persistent right infrahilar fullness in the central aspect of the right lower lobe, right hilar fullness and mediastinal adenopathy, as above, suggesting residual neoplasm with right hilar and mediastinal lymphadenopathy. 3. Enlarging sub solid lesions throughout the left lung, as above, concerning for multicentric adenocarcinoma. 4. Moderate centrilobular and paraseptal emphysema. 5. Possible 3 mm nonobstructive calculus in the interpolar collecting system of the right kidney. 6. Atherosclerosis, including left main and 3 vessel coronary artery disease. Electronically Signed   By: Vinnie Langton M.D.   On: 09/30/2015 11:16   Dg Chest Port 1 View  09/14/2015  CLINICAL DATA:  Chest tube in place EXAM: PORTABLE CHEST 1 VIEW COMPARISON:  09/13/2015; 09/12/2015; 09/10/2015 ; 08/30/2015; chest CT - 08/30/2015 FINDINGS: There is persistent  obscuration of the right heart border and right-sided mediastinal contours secondary to grossly unchanged extensive heterogeneous airspace opacities and suspected small to moderate-sized likely loculated right-sided pleural effusion. Stable position of support apparatus. No pneumothorax. Left lung remains normally aerated. No acute osseus abnormalities. IMPRESSION: 1.  Stable positioning of support apparatus.  No pneumothorax. 2. Grossly unchanged findings of extensive presumed multi focal infection involving the right lung with associated small to moderate likely loculated right-sided effusion. Electronically Signed   By: Sandi Mariscal M.D.   On: 09/14/2015 07:43   Dg Chest Port 1 View  09/13/2015  CLINICAL DATA:  Right-sided pneumothorax. EXAM: PORTABLE CHEST 1 VIEW COMPARISON:  September 12, 2015. FINDINGS: Stable cardiomediastinal silhouette. Old left rib fractures are noted. Right-sided chest tube is again noted and unchanged in position. Stable diffuse opacities of the right lung are noted concerning for pneumonia and/or atelectasis. Stable mild pleural fluid is noted in the apex and laterally in right lung base which may be loculated. Stable air collection is seen in right upper lobe which may represent small loculated pneumothorax. IMPRESSION: Stable position of right-sided chest tube. Stable diffuse opacities are seen in the right lung. Stable probable loculated pleural effusion seen in the upper and lower portions of right hemithorax. Stable probable small loculated pneumothorax seen in right lung apex. Electronically Signed   By: Marijo Conception, M.D.   On: 09/13/2015 08:07   Dg Chest Port 1 View  09/12/2015  CLINICAL DATA:  Follow-up spontaneous pneumothorax, chest tube treatment. EXAM: PORTABLE CHEST 1 VIEW COMPARISON:  Portable chest x-ray of September 10, 2015 FINDINGS: There remains volume loss on the right. Patchy confluent interstitial and alveolar opacities are present. There is a small amount  of air within the pulmonary apex adjacent to the chest tube which has its tip overlying the posterior aspect of the fifth rib. No large pneumothorax is evident. There is pleural thickening or pleural fluid lying along the lateral aspect of the right hemi thorax. The left lung is hyperinflated and there is stable shift of the mediastinum toward the right. The cardiac silhouette is enlarged. The pulmonary vascularity is normal. There are degenerative changes of both shoulders. IMPRESSION: Stable appearance of the right hemithorax. A small amount of air is present in the right upper hemithorax that may be a small loculated pneumothorax or a bullous lesion. It is stable since yesterday's study. The left lung is hyperinflated and clear. Electronically  Signed   By: David  Martinique M.D.   On: 09/12/2015 08:00   Dg Chest Port 1 View  09/10/2015  CLINICAL DATA:  Right pneumothorax EXAM: PORTABLE CHEST 1 VIEW COMPARISON:  09/09/2015 FINDINGS: Chronic right lung opacities/ fibrosis with volume loss and small right pleural effusion. Right apical chest tube. No definite pneumothorax is seen. Left lung is essentially clear. The heart is normal in size. IMPRESSION: Chronic right lung opacities/fibrosis. Right apical chest tube.  No definite pneumothorax is seen. Electronically Signed   By: Julian Hy M.D.   On: 09/10/2015 07:54   Dg Chest Port 1 View  09/09/2015  CLINICAL DATA:  Right pneumothorax. EXAM: PORTABLE CHEST 1 VIEW COMPARISON:  09/08/2015 FINDINGS: The cardiomediastinal silhouette is unchanged with stable, mild rightward mediastinal shift related to volume loss in the right hemithorax. Right chest tube remains in place. Extensive, dense consolidation throughout the right lung does not appear significantly changed. Small loculated collections of air again seen in the right upper lung without definite pneumothorax identified. Right pleural effusion is unchanged. Left lung is well aerated. IMPRESSION:  Unchanged examination. Dense consolidation in the right lung with stable right pleural effusion. Electronically Signed   By: Logan Bores M.D.   On: 09/09/2015 07:24   Dg Chest Port 1 View  09/06/2015  CLINICAL DATA:  Pneumonia.  Chest tube in place EXAM: PORTABLE CHEST 1 VIEW COMPARISON:  September 05, 2015 FINDINGS: Chest tube remains on the right, unchanged in position. No demonstrable pneumothorax. There is widespread airspace consolidation on the right with multiple areas of loculated air, stable. There is evidence of a degree of loculated effusion at the right base. The left lung is mildly hyperexpanded. Interstitial prominence on the left is probably due to redistribution of blood flow to viable lung segments. There is no frank edema or consolidation on the left. Heart is slightly enlarged with pulmonary vascularity stable and within normal limits. There is old rib trauma on the left with healing. There is a skin fold on the left. IMPRESSION: No appreciable change from 1 day prior. Extensive consolidation on the right with multiple areas of loculated air but no convincing pneumothorax. Chest tube position unchanged. Loculated effusion right base. No new opacity. No change in cardiac silhouette. Electronically Signed   By: [name]   On: 09/06/2015 07:26   Dg Chest Port 1 View  09/05/2015  CLINICAL DATA:  Chest tubes in place.  Pneumonia. EXAM: PORTABLE CHEST - 1 VIEW COMPARISON:  One-view chest x-ray 09/04/2015. FINDINGS: The right-sided chest tube remains in place. There is no definite pneumothorax although there is some loculated air near the lung apex. The trachea is deviated to the right. The heart is enlarged. Diffuse patchy opacification of the right lung persists. The left lung is clear. IMPRESSION: 1. Stable appearance of diffuse right-sided airspace disease most compatible with pneumonia. 2. Stable positioning of a right-sided chest tube. Electronically Signed   By: San Morelle M.D.   On: 09/05/2015 07:39    ASSESSMENT AND PLAN: This is a very pleasant 74 years old white male with stage IIIA non-small cell lung cancer. He is status post a course of concurrent chemoradiation with weekly carboplatin and paclitaxel with partial response. This was followed by 1 cycle of consolidation chemotherapy with carboplatin and gemcitabine but this was discontinued secondary to right pneumothorax and frequent hospitalization. The patient continues to have significant weakness and fatigue. His general condition at this point is not good enough to  resume any systemic chemotherapy. I recommended for the patient to continue on observation. I would see him back for follow-up visit in 3 months for reevaluation with repeat CT scan of the chest for restaging of his disease. For pain management I gave the patient referral of oxycodone and I also started him on long acting pain medication in the form of OxyContin 20 mg by mouth every 12 hours. He was also given a refill of Xanax for anxiety. The patient was advised to call immediately if he has any concerning symptoms in the interval. The patient and his children agreed to the current plan. He was advised to call immediately if he has any concerning symptoms in the interval. The patient voices understanding of current disease status and treatment options and is in agreement with the current care plan.  All questions were answered. The patient knows to call the clinic with any problems, questions or concerns. We can certainly see the patient much sooner if necessary.  Disclaimer: This note was dictated with voice recognition software. Similar sounding words can inadvertently be transcribed and may not be corrected upon review.

## 2015-10-04 NOTE — Telephone Encounter (Signed)
Left message for pt to call back to schedule appt

## 2015-10-04 NOTE — Telephone Encounter (Signed)
per pof to sch pt appt-gave tp copy of avs-Adv cetral sch willc all tosch scan

## 2015-10-04 NOTE — Patient Instructions (Signed)
Smoking Cessation, Tips for Success If you are ready to quit smoking, congratulations! You have chosen to help yourself be healthier. Cigarettes bring nicotine, tar, carbon monoxide, and other irritants into your body. Your lungs, heart, and blood vessels will be able to work better without these poisons. There are many different ways to quit smoking. Nicotine gum, nicotine patches, a nicotine inhaler, or nicotine nasal spray can help with physical craving. Hypnosis, support groups, and medicines help break the habit of smoking. WHAT THINGS CAN I DO TO MAKE QUITTING EASIER?  Here are some tips to help you quit for good:  Pick a date when you will quit smoking completely. Tell all of your friends and family about your plan to quit on that date.  Do not try to slowly cut down on the number of cigarettes you are smoking. Pick a quit date and quit smoking completely starting on that day.  Throw away all cigarettes.   Clean and remove all ashtrays from your home, work, and car.  On a card, write down your reasons for quitting. Carry the card with you and read it when you get the urge to smoke.  Cleanse your body of nicotine. Drink enough water and fluids to keep your urine clear or pale yellow. Do this after quitting to flush the nicotine from your body.  Learn to predict your moods. Do not let a bad situation be your excuse to have a cigarette. Some situations in your life might tempt you into wanting a cigarette.  Never have "just one" cigarette. It leads to wanting another and another. Remind yourself of your decision to quit.  Change habits associated with smoking. If you smoked while driving or when feeling stressed, try other activities to replace smoking. Stand up when drinking your coffee. Brush your teeth after eating. Sit in a different chair when you read the paper. Avoid alcohol while trying to quit, and try to drink fewer caffeinated beverages. Alcohol and caffeine may urge you to  smoke.  Avoid foods and drinks that can trigger a desire to smoke, such as sugary or spicy foods and alcohol.  Ask people who smoke not to smoke around you.  Have something planned to do right after eating or having a cup of coffee. For example, plan to take a walk or exercise.  Try a relaxation exercise to calm you down and decrease your stress. Remember, you may be tense and nervous for the first 2 weeks after you quit, but this will pass.  Find new activities to keep your hands busy. Play with a pen, coin, or rubber band. Doodle or draw things on paper.  Brush your teeth right after eating. This will help cut down on the craving for the taste of tobacco after meals. You can also try mouthwash.   Use oral substitutes in place of cigarettes. Try using lemon drops, carrots, cinnamon sticks, or chewing gum. Keep them handy so they are available when you have the urge to smoke.  When you have the urge to smoke, try deep breathing.  Designate your home as a nonsmoking area.  If you are a heavy smoker, ask your health care provider about a prescription for nicotine chewing gum. It can ease your withdrawal from nicotine.  Reward yourself. Set aside the cigarette money you save and buy yourself something nice.  Look for support from others. Join a support group or smoking cessation program. Ask someone at home or at work to help you with your plan   to quit smoking.  Always ask yourself, "Do I need this cigarette or is this just a reflex?" Tell yourself, "Today, I choose not to smoke," or "I do not want to smoke." You are reminding yourself of your decision to quit.  Do not replace cigarette smoking with electronic cigarettes (commonly called e-cigarettes). The safety of e-cigarettes is unknown, and some may contain harmful chemicals.  If you relapse, do not give up! Plan ahead and think about what you will do the next time you get the urge to smoke. HOW WILL I FEEL WHEN I QUIT SMOKING? You  may have symptoms of withdrawal because your body is used to nicotine (the addictive substance in cigarettes). You may crave cigarettes, be irritable, feel very hungry, cough often, get headaches, or have difficulty concentrating. The withdrawal symptoms are only temporary. They are strongest when you first quit but will go away within 10-14 days. When withdrawal symptoms occur, stay in control. Think about your reasons for quitting. Remind yourself that these are signs that your body is healing and getting used to being without cigarettes. Remember that withdrawal symptoms are easier to treat than the major diseases that smoking can cause.  Even after the withdrawal is over, expect periodic urges to smoke. However, these cravings are generally short lived and will go away whether you smoke or not. Do not smoke! WHAT RESOURCES ARE AVAILABLE TO HELP ME QUIT SMOKING? Your health care provider can direct you to community resources or hospitals for support, which may include:  Group support.  Education.  Hypnosis.  Therapy.   This information is not intended to replace advice given to you by your health care provider. Make sure you discuss any questions you have with your health care provider.   Document Released: 07/13/2004 Document Revised: 11/05/2014 Document Reviewed: 04/02/2013 Elsevier Interactive Patient Education 2016 [employer]  

## 2015-10-04 NOTE — Telephone Encounter (Signed)
Attempted to contact patient to advise him that Neb Medication would need to be sent to local pharmacy and to find out which pharmacy patient prefers. Left message for patient to call back.

## 2015-10-05 ENCOUNTER — Other Ambulatory Visit: Payer: Self-pay | Admitting: Cardiothoracic Surgery

## 2015-10-05 DIAGNOSIS — J939 Pneumothorax, unspecified: Secondary | ICD-10-CM

## 2015-10-05 MED ORDER — ALBUTEROL SULFATE (2.5 MG/3ML) 0.083% IN NEBU: 2.5000 mg | INHALATION_SOLUTION | Freq: Four times a day (QID) | RESPIRATORY_TRACT | Status: DC | PRN

## 2015-10-05 NOTE — Telephone Encounter (Signed)
Attempted to contact patient to schedule appointment.

## 2015-10-05 NOTE — Telephone Encounter (Signed)
Rx for nebulizer medication sent to [facility].  Patient aware. Patient has not received Nebulizer machine yet. Patient says that [facility] is supposed to come out to his home today and he wanted to know if they will be bringing out the nebulizer machine today.  Called and spoke with Melissa at [facility] to check on status of Nebulizer.  She said she will check on the order and call back. Awaiting call back from [facility].

## 2015-10-05 NOTE — Telephone Encounter (Signed)
Called and spoke with patient. Informed him of TP's recs. He stated that since MR was not available for a 6 week follow he request that he see TP. I scheduled him with TP on 11/15/14 at 11:15. Patient voiced understanding and had no further questions. Nothing further needed.

## 2015-10-05 NOTE — Telephone Encounter (Signed)
See TE dated 10/03/15 regarding appointment. Closing this encounter as it is completed.

## 2015-10-05 NOTE — Telephone Encounter (Signed)
Left message for patient to call back  

## 2015-10-05 NOTE — Telephone Encounter (Signed)
Per [name] is currently working on [name] order and they will be bringing it out to patient today. Called patient back and made him aware.  ---------------- Patient states that he has received a call from [name] regarding scheduling an appointment.   To [name] for follow up with patient regarding appointment.

## 2015-10-05 NOTE — Telephone Encounter (Signed)
Last ov on 12/5 with TP  Status: Signed       Expand All Collapse All    Patient Instructions     Finish Augmentin .  Sputum culture today .  Continue on Symbicort 2 puffs Twice daily  May use Albuterol Neb every 6hr As needed Shortness of breathing/wheezing.  Follow up with Dr. Servando Snare and Dr. Julien Nordmann this week As planned  Please contact office for sooner follow up if symptoms do not improve or worsen or seek emergency care  follow up Dr. Chase Caller in 6 weeks and As needed           Called and spoke with patient.  Informed him of TP's recs.  Patient requested that he be seen with MR and since he was unavailable requested the he be placed with TP for follow up. I scheduled patient for ov with TP on 11/10/14 at 11:15am. Patient voiced understanding and had no further questions. Nothing further needed.

## 2015-10-06 ENCOUNTER — Ambulatory Visit (INDEPENDENT_AMBULATORY_CARE_PROVIDER_SITE_OTHER): Payer: 59 | Admitting: Cardiothoracic Surgery

## 2015-10-06 ENCOUNTER — Encounter: Payer: Self-pay | Admitting: Cardiothoracic Surgery

## 2015-10-06 ENCOUNTER — Ambulatory Visit
Admission: RE | Admit: 2015-10-06 | Discharge: 2015-10-06 | Disposition: A | Discharge status: Home / Self Care | Payer: 59 | Source: Ambulatory Visit | Attending: Cardiothoracic Surgery | Admitting: Cardiothoracic Surgery

## 2015-10-06 VITALS — BP 138/78 | HR 105 | Resp 18 | Ht 70.0 in | Wt 123.0 lb

## 2015-10-06 DIAGNOSIS — B999 Unspecified infectious disease: Secondary | ICD-10-CM

## 2015-10-06 DIAGNOSIS — J939 Pneumothorax, unspecified: Secondary | ICD-10-CM | POA: Diagnosis not present

## 2015-10-06 DIAGNOSIS — C3491 Malignant neoplasm of unspecified part of right bronchus or lung: Secondary | ICD-10-CM | POA: Diagnosis not present

## 2015-10-06 DIAGNOSIS — IMO0002 Reserved for concepts with insufficient information to code with codable children: Secondary | ICD-10-CM

## 2015-10-06 DIAGNOSIS — Z938 Other artificial opening status: Secondary | ICD-10-CM

## 2015-10-06 DIAGNOSIS — J9382 Other air leak: Secondary | ICD-10-CM

## 2015-10-06 MED ORDER — AMOXICILLIN-POT CLAVULANATE 875-125 MG PO TABS: 1.0000 | ORAL_TABLET | Freq: Two times a day (BID) | ORAL | Status: DC

## 2015-10-06 NOTE — Progress Notes (Signed)
[address]       [place] [address]             [phone]      Demarr H Saulnier [place] Medical Record #[id number] Date of Birth: 1941/08/29  Referring: Curt Bears, MD Primary Care: Cammy Copa, MD  Chief Complaint:   Chest tube placement  History of Present Illness:     retuns to office after recent d/c from hospital after being treated for sob,  Last week long term chest tube was removed. Patient has been feeling better, he denies any fever chills or night sweats. Temperatures have been staying down. Overall he feels like he can breathe better and has been more active over the past week. Subtle but definite improvement in his overall well-being.  Past Medical History  Diagnosis Date  . Spontaneous pneumothorax MANY YRS AGO AND AGAIN 04/04/15    HISTORY OF (ONLY)  . Cancer ([place])     skin cancer  . Lung mass     right / HAS HAD RADIATION AND CHEMO FOR LUNG CANCER  . Inguinal hernia   . Productive cough     "DUE TO RADIATION"  . History of nonmelanoma skin cancer   . Constipation     AFTER ANESTHESIA  . Difficulty sleeping   . Radiation 05/11/15-06/21/15    NSCLCA/right lung  . Pneumothorax on right 08/24/2015     History  Smoking status  . Current Every Day Smoker -- 0.50 packs/day for 55 years  . Types: Cigarettes  Smokeless tobacco  . Never Used    History  Alcohol Use  . Yes    Comment: every other night -social     Allergies  Allergen Reactions  . Aleve [Naproxen] Rash    Current Outpatient Prescriptions  Medication Sig Dispense Refill  . acetaminophen (TYLENOL) 325 MG tablet Take 2 tablets (650 mg total) by mouth every 6 (six) hours as needed for mild pain, moderate pain, fever or headache (or Fever >/= 101).    Marland Kitchen albuterol (PROVENTIL) (2.5 MG/3ML) 0.083% nebulizer solution Take 3 mLs (2.5 mg total) by nebulization every 6 (six) hours as needed for wheezing or shortness of breath (DX: J44.9). 75 mL 5  .  ALPRAZolam (XANAX) 0.5 MG tablet Take 1 tablet (0.5 mg total) by mouth 3 (three) times daily as needed for anxiety. 20 tablet 1  . amoxicillin-clavulanate (AUGMENTIN) 875-125 MG tablet Take 1 tablet by mouth every 12 (twelve) hours. 30 tablet 0  . Ascorbic Acid (VITAMIN C) 1000 MG tablet Take 1,000 mg by mouth daily.    Marland Kitchen b complex vitamins tablet Take 1 tablet by mouth daily.    . budesonide-formoterol (SYMBICORT) 160-4.5 MCG/ACT inhaler Inhale 2 puffs into the lungs 2 (two) times daily. 1 Inhaler 1  . CALCIUM CITRATE PO Take 1 tablet by mouth daily.    . Cyanocobalamin (VITAMIN B 12 PO) Take 1 tablet by mouth daily.    Marland Kitchen guaiFENesin (MUCINEX) 600 MG 12 hr tablet Take 1 tablet (600 mg total) by mouth 2 (two) times daily as needed.    Marland Kitchen L-Arginine 1000 MG TABS Take 1,000 mg by mouth daily.    . magnesium 30 MG tablet Take 30 mg by mouth daily.    . Multiple Vitamin (MULTI VITAMIN DAILY PO) Take 1 tablet by mouth daily.    . Omega-3 Fatty Acids (FISH OIL) 1000 MG CAPS Take 1,000 mg by mouth daily.    Marland Kitchen oxyCODONE (  OXY IR/ROXICODONE) 5 MG immediate release tablet Take 1 tablet (5 mg total) by mouth every 6 (six) hours as needed for severe pain. 60 tablet 0  . oxyCODONE (OXYCONTIN) 20 mg 12 hr tablet Take 1 tablet (20 mg total) by mouth every 12 (twelve) hours. 60 tablet 0  . saccharomyces boulardii (FLORASTOR) 250 MG capsule Take 250 mg by mouth 2 (two) times daily.    . vitamin A 10000 UNIT capsule Take 10,000 Units by mouth daily.    Marland Kitchen albuterol (PROVENTIL HFA;VENTOLIN HFA) 108 (90 BASE) MCG/ACT inhaler Inhale 1-2 puffs into the lungs every 6 (six) hours as needed for wheezing or shortness of breath. (Patient not taking: Reported on 10/04/2015) 1 Inhaler 1  . magic mouthwash SOLN Take 5 mLs by mouth 3 (three) times daily. 1 tsp swish and swallow three times a day (Patient not taking: Reported on 10/04/2015) 120 mL 1   No current facility-administered medications for this visit.       Physical  Exam: BP 138/78 mmHg  Pulse 105  Resp 18  Ht '5\' 10"'$  (1.778 m)  Wt 123 lb (55.792 kg)  BMI 17.65 kg/m2  SpO2 93%  General appearance: alert and cooperative Neurologic: intact Heart: regular rate and rhythm, S1, S2 normal, no murmur, click, rub or gallop Lungs: diminished breath sounds RLL and RML Abdomen: soft, non-tender; bowel sounds normal; no masses,  no organomegaly Extremities: extremities normal, atraumatic, no cyanosis or edema and Homans sign is negative, no sign of DVT Wound: Chest tube site is completely healed Chest tube was removed in the office last week.   Diagnostic Studies & Laboratory data:     Recent Radiology Findings:  Dg Chest 2 View  10/06/2015  CLINICAL DATA:  Pneumothorax. Lung cancer with thoracic surgery. Prior right chest tube placement EXAM: CHEST  2 VIEW COMPARISON:  Chest CT 09/30/2015, chest x-ray 09/27/2015. FINDINGS: Loculated gas in the right apex is slightly larger. Based on the prior chest CT this appears to represent pleural gas consistent with loculated pneumothorax. There is pleural thickening throughout the right hemi thorax. Dense consolidation in the right lung is unchanged from the prior chest x-ray. Left lung remains clear without infiltrate or effusion. Underlying COPD. IMPRESSION: Right apical gas collection appears to be loculated pneumothorax based on prior CT. This is slightly larger than the chest x-ray 09/27/2015. Chronic consolidation and pleural thickening on the right unchanged. Electronically Signed   By: Franchot Gallo M.D.   On: 10/06/2015 08:55   Ct Chest Wo Contrast  09/30/2015  CLINICAL DATA:  74 year old male with history of pneumothorax. Followup after chest tube removal. 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 EXAM: CT CHEST WITHOUT CONTRAST TECHNIQUE: Multidetector CT imaging of the chest was performed following the standard protocol without IV contrast. COMPARISON:  Chest CT 08/30/2015. FINDINGS:  Mediastinum/Lymph Nodes: Heart size is normal. Small amount of pericardial fluid and/or thickening, unlikely to be of any hemodynamic significance at this time. No associated pericardial calcification. There is atherosclerosis of the thoracic aorta, the great vessels of the mediastinum and the coronary arteries, including calcified atherosclerotic plaque in the left main, left anterior descending, left circumflex and right coronary arteries. Calcifications of the aortic valve. Multiple borderline enlarged and mildly enlarged mediastinal lymph nodes are again noted, measuring up to 1.2 cm in short axis in the right paratracheal nodal station. There is also soft tissue fullness in the right hilar region, suggesting lymphadenopathy (poorly evaluated on today's noncontrast CT  examination). Esophagus is unremarkable in appearance. No axillary lymphadenopathy. Lungs/Pleura: When compared to remote prior examination 07/22/2015, there is evidence of severe chronic volume loss in the right hemithorax, which is notably smaller than the contralateral side. There is chronic collapse and consolidation of much of the right lung, most severe in the right upper lobe, where there also appears to be extensive varicose and cystic bronchiectasis. Cavity in the apex of the right lung appears to communicate with a thick walled cavity in the apex of the right pleural space, best appreciated on image 13 of series 3. This thick-walled cavity in the right pleural space appears contiguous with a tract along the anterior aspect of the right pleural space, extending into the lower right hemithorax, which likely represents the old chest tube tract. Other than this small amount of pleural gas, there is no significant residual pneumothorax. Small chronic thick-walled right pleural effusion posteriorly. Previously demonstrated right middle and lower lobe lesions are now obscured, but as mentioned above, there is extensive soft tissue fullness in  the right hilar region, and in the infrahilar aspect of the right lower lobe, which suggests residual neoplasm and/or adenopathy. Again noted is a large mass-like area of ground-glass attenuation, septal thickening and cystic change in the posterior aspect of the superior segment of the left lower lobe (image 30 of series 3), which appears larger than prior examinations, currently measuring 3.1 x 2.4 cm. There is also a irregular ground-glass attenuation lesion with septal thickening in the periphery of the left upper lobe (image 16 of series 3) which measures 2.4 x 1.2 cm, and an irregular sub solid lesion with spiculated margins in the inferior aspect of the left lower lobe (image 52 of series 3) which measures 15 x 16 mm. Upper Abdomen: 3 mm calcification in the interpolar region of the right kidney may be vascular, or may be a nonobstructive calculus. Musculoskeletal/Soft Tissues: There are no aggressive appearing lytic or blastic lesions noted in the visualized portions of the skeleton. IMPRESSION: 1. Right-sided fibrothorax. 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 2. Worsening chronic consolidation and collapse in the right lung, with extensive nodular septal thickening, and persistent right infrahilar fullness in the central aspect of the right lower lobe, right hilar fullness and mediastinal adenopathy, as above, suggesting residual neoplasm with right hilar and mediastinal lymphadenopathy. 3. Enlarging sub solid lesions throughout the left lung, as above, concerning for multicentric adenocarcinoma. 4. Moderate centrilobular and paraseptal emphysema. 5. Possible 3 mm nonobstructive calculus in the interpolar collecting system of the right kidney. 6. Atherosclerosis, including left main and 3 vessel  coronary artery disease. Electronically Signed   By: Vinnie Langton M.D.   On: 09/30/2015 11:16         Recent Lab Findings: Lab Results  Component Value Date   WBC 11.7* 10/04/2015   HGB 10.1* 10/04/2015   HCT 31.6* 10/04/2015   PLT 407* 10/04/2015   GLUCOSE 113 10/04/2015   ALT 19 10/04/2015   AST 21 10/04/2015   NA 138 10/04/2015   K 4.1 10/04/2015   CL 102 09/12/2015   CREATININE 0.9 10/04/2015   BUN 24.9 10/04/2015   CO2 28 10/04/2015   INR 1.10 08/01/2015      Assessment / Plan:     There has been some overall better aeration  of the right lung, but still significant airspace disease on the right,  The patient will continue with his Augmentin to complete 5 more days. At the completion of the antibiotics he will carefully monitor his temperature, if he has any recurrent fevers or change in sputum production we will resume his Augmentin. He has a paper prescription for Augmentin to use should we instructed to proceed.   He continues on home oxygen  Overall since his discharge home he has improved, slightly more active and feels better I'll see him back in 4 weeks He is discussed with Dr. Earlie Server further chemotherapy but this is currently on hold.   [name]      [address] [place] [address] Office [phone]   Beeper [phone]  10/06/2015 9:49 AM

## 2015-10-07 ENCOUNTER — Telehealth: Payer: Self-pay | Admitting: Medical Oncology

## 2015-10-07 ENCOUNTER — Other Ambulatory Visit: Payer: Self-pay | Admitting: Medical Oncology

## 2015-10-07 ENCOUNTER — Other Ambulatory Visit: Payer: 59

## 2015-10-07 DIAGNOSIS — C3491 Malignant neoplasm of unspecified part of right bronchus or lung: Secondary | ICD-10-CM

## 2015-10-07 DIAGNOSIS — J939 Pneumothorax, unspecified: Secondary | ICD-10-CM

## 2015-10-07 NOTE — Telephone Encounter (Signed)
Pt stated [facility] needs order to increase oxygen to 3 liters per Dr Julien Nordmann . Oxygen Order for 3 liters /[place]  faxed to [facility] and unable to leave message on pt phone due to voice mail full

## 2015-10-10 ENCOUNTER — Inpatient Hospital Stay: Payer: 59 | Admitting: Adult Health

## 2015-10-10 LAB — RESPIRATORY CULTURE OR RESPIRATORY AND SPUTUM CULTURE
GRAM STAIN: NONE SEEN
ORGANISM ID, BACTERIA: NORMAL

## 2015-11-02 ENCOUNTER — Other Ambulatory Visit: Payer: Self-pay | Admitting: Thoracic Surgery (Cardiothoracic Vascular Surgery)

## 2015-11-02 ENCOUNTER — Other Ambulatory Visit: Payer: Self-pay | Admitting: Cardiothoracic Surgery

## 2015-11-02 DIAGNOSIS — C3490 Malignant neoplasm of unspecified part of unspecified bronchus or lung: Secondary | ICD-10-CM

## 2015-11-03 ENCOUNTER — Encounter: Payer: Self-pay | Admitting: Cardiothoracic Surgery

## 2015-11-03 ENCOUNTER — Other Ambulatory Visit: Payer: Self-pay | Admitting: *Deleted

## 2015-11-03 ENCOUNTER — Other Ambulatory Visit: Payer: Self-pay

## 2015-11-03 ENCOUNTER — Ambulatory Visit (INDEPENDENT_AMBULATORY_CARE_PROVIDER_SITE_OTHER): Payer: 59 | Admitting: Cardiothoracic Surgery

## 2015-11-03 ENCOUNTER — Ambulatory Visit
Admission: RE | Admit: 2015-11-03 | Discharge: 2015-11-03 | Disposition: A | Discharge status: Home / Self Care | Payer: 59 | Source: Ambulatory Visit | Attending: Cardiothoracic Surgery | Admitting: Cardiothoracic Surgery

## 2015-11-03 VITALS — BP 150/88 | HR 100 | Resp 20 | Ht 70.0 in | Wt 126.0 lb

## 2015-11-03 DIAGNOSIS — C3491 Malignant neoplasm of unspecified part of right bronchus or lung: Secondary | ICD-10-CM

## 2015-11-03 DIAGNOSIS — IMO0002 Reserved for concepts with insufficient information to code with codable children: Secondary | ICD-10-CM

## 2015-11-03 DIAGNOSIS — Z938 Other artificial opening status: Secondary | ICD-10-CM

## 2015-11-03 DIAGNOSIS — J939 Pneumothorax, unspecified: Secondary | ICD-10-CM

## 2015-11-03 DIAGNOSIS — B370 Candidal stomatitis: Secondary | ICD-10-CM

## 2015-11-03 DIAGNOSIS — J9382 Other air leak: Secondary | ICD-10-CM | POA: Diagnosis not present

## 2015-11-03 DIAGNOSIS — C3490 Malignant neoplasm of unspecified part of unspecified bronchus or lung: Secondary | ICD-10-CM

## 2015-11-03 MED ORDER — ALPRAZOLAM 0.5 MG PO TABS: 0.5000 mg | ORAL_TABLET | Freq: Three times a day (TID) | ORAL | Status: DC | PRN

## 2015-11-03 MED ORDER — MORPHINE SULFATE ER 15 MG PO TBCR: 15.0000 mg | EXTENDED_RELEASE_TABLET | Freq: Two times a day (BID) | ORAL | Status: DC

## 2015-11-03 MED ORDER — MAGIC MOUTHWASH: 5.0000 mL | Freq: Three times a day (TID) | ORAL | Status: DC

## 2015-11-03 MED ORDER — OXYCODONE HCL 5 MG PO TABS: 5.0000 mg | ORAL_TABLET | Freq: Four times a day (QID) | ORAL | Status: DC | PRN

## 2015-11-03 MED ORDER — OXYCODONE HCL ER 20 MG PO T12A: 20.0000 mg | EXTENDED_RELEASE_TABLET | Freq: Two times a day (BID) | ORAL | Status: DC

## 2015-11-03 NOTE — Progress Notes (Signed)
[address]       [place] [address]             [phone]      Gabriel [name]  Medical Record #[id number] Date of Birth: 1941/04/09  Referring: Curt Bears, MD Primary Care: Cammy Copa, MD  Chief Complaint:   Chest tube placement  History of Present Illness:     Retuns to office after recent d/c from hospital after being treated for sob, since last seen he has been feeling better, he denies any fever chills or night sweats. Temperatures have been staying down. Overall he feels like he can breathe better and has been more active over the past week. Over the holidays he has gained several pounds, is walking and more active around the house, diet has improved. Subtle but definite improvement in his overall well-being.  Past Medical History  Diagnosis Date  . Spontaneous pneumothorax MANY YRS AGO AND AGAIN 04/04/15    HISTORY OF (ONLY)  . Cancer ([place])     skin cancer  . Lung mass     right / HAS HAD RADIATION AND CHEMO FOR LUNG CANCER  . Inguinal hernia   . Productive cough     "DUE TO RADIATION"  . History of nonmelanoma skin cancer   . Constipation     AFTER ANESTHESIA  . Difficulty sleeping   . Radiation 05/11/15-06/21/15    NSCLCA/right lung  . Pneumothorax on right 08/24/2015     History  Smoking status  . Current Every Day Smoker -- 0.50 packs/day for 55 years  . Types: Cigarettes  Smokeless tobacco  . Never Used    History  Alcohol Use  . Yes    Comment: every other night -social     Allergies  Allergen Reactions  . Aleve [Naproxen] Rash    Current Outpatient Prescriptions  Medication Sig Dispense Refill  . acetaminophen (TYLENOL) 325 MG tablet Take 2 tablets (650 mg total) by mouth every 6 (six) hours as needed for mild pain, moderate pain, fever or headache (or Fever >/= 101).    Marland Kitchen albuterol (PROVENTIL HFA;VENTOLIN HFA) 108 (90 BASE) MCG/ACT inhaler Inhale 1-2 puffs into the lungs every 6 (six) hours  as needed for wheezing or shortness of breath. 1 Inhaler 1  . albuterol (PROVENTIL) (2.5 MG/3ML) 0.083% nebulizer solution Take 3 mLs (2.5 mg total) by nebulization every 6 (six) hours as needed for wheezing or shortness of breath (DX: J44.9). 75 mL 5  . ALPRAZolam (XANAX) 0.5 MG tablet Take 1 tablet (0.5 mg total) by mouth 3 (three) times daily as needed for anxiety. 20 tablet 1  . Ascorbic Acid (VITAMIN C) 1000 MG tablet Take 1,000 mg by mouth daily.    Marland Kitchen b complex vitamins tablet Take 1 tablet by mouth daily.    . budesonide-formoterol (SYMBICORT) 160-4.5 MCG/ACT inhaler Inhale 2 puffs into the lungs 2 (two) times daily. 1 Inhaler 1  . CALCIUM CITRATE PO Take 1 tablet by mouth daily.    . Cyanocobalamin (VITAMIN B 12 PO) Take 1 tablet by mouth daily.    Marland Kitchen guaiFENesin (MUCINEX) 600 MG 12 hr tablet Take 1 tablet (600 mg total) by mouth 2 (two) times daily as needed.    Marland Kitchen L-Arginine 1000 MG TABS Take 1,000 mg by mouth daily.    . magic mouthwash SOLN Take 5 mLs by mouth 3 (three) times daily. 1 tsp swish and swallow three times a day 120 mL  1  . magnesium 30 MG tablet Take 30 mg by mouth daily.    . Multiple Vitamin (MULTI VITAMIN DAILY PO) Take 1 tablet by mouth daily.    . Omega-3 Fatty Acids (FISH OIL) 1000 MG CAPS Take 1,000 mg by mouth daily.    Marland Kitchen oxyCODONE (OXY IR/ROXICODONE) 5 MG immediate release tablet Take 1 tablet (5 mg total) by mouth every 6 (six) hours as needed for severe pain. 60 tablet 0  . oxyCODONE (OXYCONTIN) 20 mg 12 hr tablet Take 1 tablet (20 mg total) by mouth every 12 (twelve) hours. 60 tablet 0  . saccharomyces boulardii (FLORASTOR) 250 MG capsule Take 250 mg by mouth 2 (two) times daily.    . vitamin A 10000 UNIT capsule Take 10,000 Units by mouth daily.     No current facility-administered medications for this visit.       Physical Exam: BP 150/88 mmHg  Pulse 100  Resp 20  Ht '5\' 10"'$  (1.778 m)  Wt 126 lb (57.153 kg)  BMI 18.08 kg/m2  SpO2 96%  General  appearance: alert and cooperative Neurologic: intact Heart: regular rate and rhythm, S1, S2 normal, no murmur, click, rub or gallop Lungs: diminished breath sounds RLL and RML Abdomen: soft, non-tender; bowel sounds normal; no masses,  no organomegaly Extremities: extremities normal, atraumatic, no cyanosis or edema and Homans sign is negative, no sign of DVT Wound: Chest tube site is completely healed .   Wt Readings from Last 3 Encounters:  11/03/15 126 lb (57.153 kg)  10/06/15 123 lb (55.792 kg)  10/04/15 123 lb 9.6 oz (56.065 kg)     Diagnostic Studies & Laboratory data:     Recent Radiology Findings:  Dg Chest 2 View  11/03/2015  CLINICAL DATA:  Malignant neoplasm of lung, unspecified laterality, unspecified part of lung Malignant neoplasm of lung, unspecified laterality, unspecified part of lung Hx of pneumothorax in Rt lung, no chest tube now EXAM: CHEST  2 VIEW COMPARISON:  Twelve 8,016 FINDINGS: Persistent dense opacity and cavitary lesion identified in the right lung associated with volume loss on the right. Air-fluid levels are identified within the cavitary spaces on the right. The left lung is hyperinflated. Small focal opacities in the left lung appear stable compared with previous CT exam. Heart size is normal. No pulmonary edema. Old left rib fractures. IMPRESSION: Persistent cavitary lesion within the right hemi thorax, showing little interval change. Electronically Signed   By: Nolon Nations M.D.   On: 11/03/2015 11:19   Ct Chest Wo Contrast  09/30/2015  CLINICAL DATA:  75 year old male with history of pneumothorax. Followup after chest tube removal. 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 EXAM: CT CHEST WITHOUT CONTRAST TECHNIQUE: Multidetector CT imaging of the chest was performed following the standard protocol without IV contrast. COMPARISON:  Chest CT 08/30/2015. FINDINGS: Mediastinum/Lymph Nodes: Heart size is normal. Small amount of  pericardial fluid and/or thickening, unlikely to be of any hemodynamic significance at this time. No associated pericardial calcification. There is atherosclerosis of the thoracic aorta, the great vessels of the mediastinum and the coronary arteries, including calcified atherosclerotic plaque in the left main, left anterior descending, left circumflex and right coronary arteries. Calcifications of the aortic valve. Multiple borderline enlarged and mildly enlarged mediastinal lymph nodes are again noted, measuring up to 1.2 cm in short axis in the right paratracheal nodal station. There is also soft tissue fullness in the right hilar region, suggesting lymphadenopathy (poorly evaluated on today's  noncontrast CT examination). Esophagus is unremarkable in appearance. No axillary lymphadenopathy. Lungs/Pleura: When compared to remote prior examination 07/22/2015, there is evidence of severe chronic volume loss in the right hemithorax, which is notably smaller than the contralateral side. There is chronic collapse and consolidation of much of the right lung, most severe in the right upper lobe, where there also appears to be extensive varicose and cystic bronchiectasis. Cavity in the apex of the right lung appears to communicate with a thick walled cavity in the apex of the right pleural space, best appreciated on image 13 of series 3. This thick-walled cavity in the right pleural space appears contiguous with a tract along the anterior aspect of the right pleural space, extending into the lower right hemithorax, which likely represents the old chest tube tract. Other than this small amount of pleural gas, there is no significant residual pneumothorax. Small chronic thick-walled right pleural effusion posteriorly. Previously demonstrated right middle and lower lobe lesions are now obscured, but as mentioned above, there is extensive soft tissue fullness in the right hilar region, and in the infrahilar aspect of the right  lower lobe, which suggests residual neoplasm and/or adenopathy. Again noted is a large mass-like area of ground-glass attenuation, septal thickening and cystic change in the posterior aspect of the superior segment of the left lower lobe (image 30 of series 3), which appears larger than prior examinations, currently measuring 3.1 x 2.4 cm. There is also a irregular ground-glass attenuation lesion with septal thickening in the periphery of the left upper lobe (image 16 of series 3) which measures 2.4 x 1.2 cm, and an irregular sub solid lesion with spiculated margins in the inferior aspect of the left lower lobe (image 52 of series 3) which measures 15 x 16 mm. Upper Abdomen: 3 mm calcification in the interpolar region of the right kidney may be vascular, or may be a nonobstructive calculus. Musculoskeletal/Soft Tissues: There are no aggressive appearing lytic or blastic lesions noted in the visualized portions of the skeleton. IMPRESSION: 1. Right-sided fibrothorax. 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 2. Worsening chronic consolidation and collapse in the right lung, with extensive nodular septal thickening, and persistent right infrahilar fullness in the central aspect of the right lower lobe, right hilar fullness and mediastinal adenopathy, as above, suggesting residual neoplasm with right hilar and mediastinal lymphadenopathy. 3. Enlarging sub solid lesions throughout the left lung, as above, concerning for multicentric adenocarcinoma. 4. Moderate centrilobular and paraseptal emphysema. 5. Possible 3 mm nonobstructive calculus in the interpolar collecting system of the right kidney. 6. Atherosclerosis, including left main and 3 vessel coronary artery disease. Electronically Signed   By: Vinnie Langton  M.D.   On: 09/30/2015 11:16         Recent Lab Findings: Lab Results  Component Value Date   WBC 11.7* 10/04/2015   HGB 10.1* 10/04/2015   HCT 31.6* 10/04/2015   PLT 407* 10/04/2015   GLUCOSE 113 10/04/2015   ALT 19 10/04/2015   AST 21 10/04/2015   NA 138 10/04/2015   K 4.1 10/04/2015   CL 102 09/12/2015   CREATININE 0.9 10/04/2015   BUN 24.9 10/04/2015   CO2 28 10/04/2015   INR 1.10 08/01/2015      Assessment / Plan:  Overall patient is stable without recurrent fever chills  or evidence of infection, his right chest x-ray showed ill shows chronic changes in the right chest related to radiation and subsequent pneumothorax and empyema. Currently he is under observation only in regard to his lung malignancy . He is to have a follow-up CT scan of the chest early March.  I renewed his Xanax OxyContin and Magic mouthwash plan to see him back in early March.   [name]      [address] [place] [address] Office [phone]   Beeper [phone]  11/03/2015 11:32 AM

## 2015-11-03 NOTE — Telephone Encounter (Signed)
RX  Refill for oxycodone 5 mg refilled and a new RX for Ms Contin 15 mg po every 12 hous written due to insurance not covering the OxyContin  20 mg anymore. Patent will pick up RX's at front desk

## 2015-11-10 ENCOUNTER — Telehealth: Payer: Self-pay | Admitting: *Deleted

## 2015-11-10 NOTE — Telephone Encounter (Signed)
Patient's son Gabriel [name] called requesting Dr. Julien Nordmann sign application for disability parking placard for dad.  Would like a call [phone] when ready for pick up.  Application printed and to provider to complete.  Aware Dr. Julien Nordmann returns to office next week.

## 2015-11-14 ENCOUNTER — Telehealth: Payer: Self-pay | Admitting: Medical Oncology

## 2015-11-14 NOTE — Telephone Encounter (Signed)
Son notified top ick up handicap placard application at front desk.

## 2015-11-14 NOTE — Telephone Encounter (Signed)
Gabriel [name]  11/14/2015  Telephone  MRN:  [id number]   Description: 75 year old male  Provider: Ardeen Garland, RN  Department: [facility]          Call Documentation      Ardeen Garland, RN at 11/14/2015 1:32 PM     Status: Signed       Expand All Collapse All   Son notified top ick up handicap placard application at front desk.

## 2015-11-16 ENCOUNTER — Ambulatory Visit (INDEPENDENT_AMBULATORY_CARE_PROVIDER_SITE_OTHER): Payer: 59 | Admitting: Adult Health

## 2015-11-16 ENCOUNTER — Encounter: Payer: Self-pay | Admitting: Adult Health

## 2015-11-16 VITALS — BP 130/80 | HR 98 | Temp 98.3°F | Ht 70.0 in | Wt 124.0 lb

## 2015-11-16 DIAGNOSIS — C3491 Malignant neoplasm of unspecified part of right bronchus or lung: Secondary | ICD-10-CM

## 2015-11-16 DIAGNOSIS — J449 Chronic obstructive pulmonary disease, unspecified: Secondary | ICD-10-CM

## 2015-11-16 NOTE — Assessment & Plan Note (Signed)
Cont follow up with Oncology as planned  Plan  Follow up for CT chest as planned in March.  Follow up with Dr. Julien Nordmann as planned  follow up Dr. Chase Caller in 3 months and As needed

## 2015-11-16 NOTE — Assessment & Plan Note (Signed)
Trial of Spiria   Plan  Continue on Symbicort 2 puffs Twice daily   Begin Spiriva 2 puffs daily  Follow up for CT chest as planned in March.  Follow up with Dr. Julien Nordmann as planned  Saline nasal rinses and gel As needed   Continue on Oxygen.  Order for POC evaluation.  Mucinex or Mucinex DM Twice daily  As needed   follow up Dr. Chase Caller in 3 months and As needed

## 2015-11-16 NOTE — Patient Instructions (Signed)
Continue on Symbicort 2 puffs Twice daily   Begin Spiriva 2 puffs daily  Follow up for CT chest as planned in March.  Follow up with Dr. Julien Nordmann as planned  Saline nasal rinses and gel As needed   Continue on Oxygen.  Order for POC evaluation.  Mucinex or Mucinex DM Twice daily  As needed   follow up Dr. Chase Caller in 3 months and As needed

## 2015-11-16 NOTE — Progress Notes (Signed)
Subjective:    Patient ID: Gabriel [name], male    DOB: April 27, 1941, 75 y.o.   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 Recent admission for recurrent PTX in Oct 2016    11/16/2015 Follow up : Lung cancer   Pt was recently admitted x 3 to hospital for persistent  PTX despite chest tube,  HCAP , and COPD exacerbation. Tx w/ prolonged abx.  CT chest 11/1 showed dense consolidation in RUL/RML/RLL , stable left sided lung nodules.  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 And enlarging left sided lesions . Moderate emphysema.    Followed with Gabriel [name] for lung cancer , treatment is on hold. Has planned CT chest follow up in March.  Strength is getting better. Eating better but appetite is still not back to baseline.  Walking more at home , has increased his endurance.  Still smoking , discussed cessation.  Symbicort Twice daily   Feels he gets winded very easily , wants to another inhaler or something that will help with his breathing. Does have cough and congestion intermittenty.  No chest pain, orthopnea, edema or fever.    Past Medical History  Diagnosis Date  . Spontaneous pneumothorax MANY YRS AGO AND AGAIN 04/04/15    HISTORY OF (ONLY)  . Cancer ([place])     skin cancer  . Lung mass     right / HAS HAD RADIATION AND CHEMO FOR LUNG CANCER  . Inguinal hernia   . Productive cough     "DUE TO RADIATION"  . History of nonmelanoma skin cancer   . Constipation     AFTER ANESTHESIA  . Difficulty sleeping   . Radiation 05/11/15-06/21/15    NSCLCA/right lung  . Pneumothorax on right 08/24/2015   Current Outpatient Prescriptions on File Prior to Visit  Medication Sig Dispense Refill  . acetaminophen (TYLENOL) 325 MG tablet Take 2  tablets (650 mg total) by mouth every 6 (six) hours as needed for mild pain, moderate pain, fever or headache (or Fever >/= 101).    Marland Kitchen albuterol (PROVENTIL HFA;VENTOLIN HFA) 108 (90 BASE) MCG/ACT inhaler Inhale 1-2 puffs into the lungs every 6 (six) hours as needed for wheezing or shortness of breath. 1 Inhaler 1  . albuterol (PROVENTIL) (2.5 MG/3ML) 0.083% nebulizer solution Take 3 mLs (2.5 mg total) by nebulization every 6 (six) hours as needed for wheezing or shortness of breath (DX: J44.9). 75 mL 5  . ALPRAZolam (XANAX) 0.5 MG tablet Take 1 tablet (0.5 mg total) by mouth 3 (three) times daily as needed for anxiety. 20 tablet 1  . Ascorbic Acid (VITAMIN C) 1000 MG tablet Take 1,000 mg by mouth daily.    Marland Kitchen b complex vitamins tablet Take 1 tablet by mouth daily.    . budesonide-formoterol (SYMBICORT) 160-4.5 MCG/ACT inhaler Inhale 2 puffs into the lungs 2 (two) times daily. 1 Inhaler 1  . CALCIUM CITRATE PO Take 1 tablet by mouth daily.    . Cyanocobalamin (VITAMIN B 12 PO) Take 1 tablet by mouth daily.    Marland Kitchen guaiFENesin (MUCINEX) 600 MG 12 hr tablet Take 1 tablet (600 mg total) by mouth 2 (two) times daily as needed.    Marland Kitchen L-Arginine 1000 MG TABS  Take 1,000 mg by mouth daily.    . magic mouthwash SOLN Take 5 mLs by mouth 3 (three) times daily. 1 tsp swish and swallow three times a day 120 mL 3  . magnesium 30 MG tablet Take 30 mg by mouth daily.    Marland Kitchen morphine (MS CONTIN) 15 MG 12 hr tablet Take 1 tablet (15 mg total) by mouth every 12 (twelve) hours. 60 tablet 0  . Multiple Vitamin (MULTI VITAMIN DAILY PO) Take 1 tablet by mouth daily.    . Omega-3 Fatty Acids (FISH OIL) 1000 MG CAPS Take 1,000 mg by mouth daily.    Marland Kitchen oxyCODONE (OXY IR/ROXICODONE) 5 MG immediate release tablet Take 1-2 tablets (5-10 mg total) by mouth every 6 (six) hours as needed for severe pain. 60 tablet 0  . saccharomyces boulardii (FLORASTOR) 250 MG capsule Take 250 mg by mouth 2 (two) times daily.    . vitamin A 10000 UNIT  capsule Take 10,000 Units by mouth daily.     No current facility-administered medications on file prior to visit.     Review of Systems  Constitutional:   No  weight loss, night sweats,  Fevers, chills, + fatigue, or  lassitude.  HEENT:   No headaches,  Difficulty swallowing,  Tooth/dental problems, or  Sore throat,                No sneezing, itching, ear ache, nasal congestion, post nasal drip,   CV:  No chest pain,  Orthopnea, PND, swelling in lower extremities, anasarca, dizziness, palpitations, syncope.   GI  No heartburn, indigestion, abdominal pain, nausea, vomiting, diarrhea, change in bowel habits, loss of appetite, bloody stools.   Resp:    No chest wall deformity  Skin: no rash or lesions.  GU: no dysuria, change in color of urine, no urgency or frequency.  No flank pain, no hematuria   MS:  No joint pain or swelling.  No decreased range of motion.  No back pain.  Psych:  No change in mood or affect. No depression or anxiety.  No memory loss.         Objective:   Physical Exam  GEN: A/Ox3; pleasant , NAD, thin and frail , on O2   HEENT:  [place]/AT,  EACs-clear, TMs-wnl, NOSE-clear, THROAT-clear, no lesions, no postnasal drip or exudate noted.   NECK:  Supple w/ fair ROM; no JVD; normal carotid impulses w/o bruits; no thyromegaly or nodules palpated; no lymphadenopathy.  RESP  Decreased BS on right , no accessory muscle use, no dullness to percussion  CARD:  RRR, no m/r/g  , no peripheral edema, pulses intact, no cyanosis or clubbing.  GI:   Soft & nt; nml bowel sounds; no organomegaly or masses detected.  Musco: Warm bil, no deformities or joint swelling noted.   Neuro: alert, no focal deficits noted.    Skin: Warm, no lesions or rashes    CT chest 09/30/15  Right-sided fibrothorax. 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 2. Worsening chronic consolidation and collapse in the right lung, with extensive nodular septal thickening, and persistent right infrahilar fullness in the central aspect of the right lower lobe, right hilar fullness and mediastinal adenopathy, as above, suggesting residual neoplasm with right hilar and mediastinal lymphadenopathy. 3. Enlarging sub solid lesions throughout the  left lung, as above, concerning for multicentric adenocarcinoma.      Assessment & Plan:

## 2015-11-21 ENCOUNTER — Telehealth: Payer: Self-pay | Admitting: *Deleted

## 2015-11-21 DIAGNOSIS — C3491 Malignant neoplasm of unspecified part of right bronchus or lung: Secondary | ICD-10-CM

## 2015-11-21 MED ORDER — ALPRAZOLAM 0.5 MG PO TABS: 0.5000 mg | ORAL_TABLET | Freq: Three times a day (TID) | ORAL | Status: DC

## 2015-11-21 NOTE — Telephone Encounter (Signed)
Pt called requesting Xanax refill. Reviewed with MD. Madaline Brilliant to refill pt Xanax Rx. RX called into [facility].Pt notified.

## 2015-11-23 ENCOUNTER — Telehealth: Payer: Self-pay | Admitting: Internal Medicine

## 2015-11-23 NOTE — Telephone Encounter (Signed)
Left message for patient in regards to appointment changes due to provider not bing in the office. New schedule was sent by mail.

## 2015-11-28 ENCOUNTER — Telehealth: Payer: Self-pay | Admitting: Internal Medicine

## 2015-11-28 MED ORDER — TIOTROPIUM BROMIDE MONOHYDRATE 2.5 MCG/ACT IN AERS: 2.0000 | INHALATION_SPRAY | Freq: Every day | RESPIRATORY_TRACT | Status: DC

## 2015-11-28 NOTE — Telephone Encounter (Signed)
Spoke with pt. He needs a prescription for Spiriva Respimat sent in. This was given to him by TP earlier this month. Rx has been sent in. Nothing further was needed.

## 2015-12-13 ENCOUNTER — Encounter: Payer: Self-pay | Admitting: Medical Oncology

## 2015-12-20 ENCOUNTER — Other Ambulatory Visit: Payer: Self-pay

## 2015-12-20 DIAGNOSIS — B370 Candidal stomatitis: Secondary | ICD-10-CM

## 2015-12-20 MED ORDER — MAGIC MOUTHWASH: 5.0000 mL | Freq: Three times a day (TID) | ORAL | Status: DC

## 2015-12-20 NOTE — Telephone Encounter (Signed)
RX was sent to [facility]

## 2015-12-21 ENCOUNTER — Other Ambulatory Visit: Payer: Self-pay | Admitting: *Deleted

## 2015-12-21 DIAGNOSIS — B370 Candidal stomatitis: Secondary | ICD-10-CM

## 2015-12-21 MED ORDER — MAGIC MOUTHWASH: 5.0000 mL | Freq: Three times a day (TID) | ORAL | Status: DC

## 2015-12-26 ENCOUNTER — Other Ambulatory Visit: Payer: Self-pay | Admitting: *Deleted

## 2015-12-26 DIAGNOSIS — B370 Candidal stomatitis: Secondary | ICD-10-CM

## 2015-12-26 MED ORDER — MAGIC MOUTHWASH: 5.0000 mL | Freq: Three times a day (TID) | ORAL | Status: DC

## 2015-12-30 ENCOUNTER — Other Ambulatory Visit: Payer: 59

## 2016-01-03 ENCOUNTER — Ambulatory Visit (HOSPITAL_COMMUNITY)
Admission: RE | Admit: 2016-01-03 | Discharge: 2016-01-03 | Disposition: A | Discharge status: Home / Self Care | Payer: 59 | Source: Ambulatory Visit | Attending: Internal Medicine | Admitting: Internal Medicine

## 2016-01-03 ENCOUNTER — Other Ambulatory Visit (HOSPITAL_BASED_OUTPATIENT_CLINIC_OR_DEPARTMENT_OTHER): Payer: 59

## 2016-01-03 DIAGNOSIS — I2510 Atherosclerotic heart disease of native coronary artery without angina pectoris: Secondary | ICD-10-CM | POA: Insufficient documentation

## 2016-01-03 DIAGNOSIS — C3491 Malignant neoplasm of unspecified part of right bronchus or lung: Secondary | ICD-10-CM | POA: Insufficient documentation

## 2016-01-03 DIAGNOSIS — N289 Disorder of kidney and ureter, unspecified: Secondary | ICD-10-CM | POA: Diagnosis not present

## 2016-01-03 DIAGNOSIS — I7090 Unspecified atherosclerosis: Secondary | ICD-10-CM | POA: Diagnosis not present

## 2016-01-03 DIAGNOSIS — J479 Bronchiectasis, uncomplicated: Secondary | ICD-10-CM | POA: Insufficient documentation

## 2016-01-03 DIAGNOSIS — S2231XS Fracture of one rib, right side, sequela: Secondary | ICD-10-CM | POA: Diagnosis not present

## 2016-01-03 LAB — COMPREHENSIVE METABOLIC PANEL
ALBUMIN: 3.9 g/dL (ref 3.5–5.0)
ALK PHOS: 112 U/L (ref 40–150)
ALT: 25 U/L (ref 0–55)
AST: 22 U/L (ref 5–34)
Anion Gap: 8 mEq/L (ref 3–11)
BILIRUBIN TOTAL: 0.22 mg/dL (ref 0.20–1.20)
BUN: 32.3 mg/dL — AB (ref 7.0–26.0)
CO2: 26 meq/L (ref 22–29)
CREATININE: 1.1 mg/dL (ref 0.7–1.3)
Calcium: 10.2 mg/dL (ref 8.4–10.4)
Chloride: 105 mEq/L (ref 98–109)
EGFR: 66 mL/min/{1.73_m2} — AB (ref >90)
GLUCOSE: 97 mg/dL (ref 70–140)
Potassium: 4.2 mEq/L (ref 3.5–5.1)
SODIUM: 139 meq/L (ref 136–145)
TOTAL PROTEIN: 8.8 g/dL — AB (ref 6.4–8.3)

## 2016-01-03 LAB — CBC WITH DIFFERENTIAL/PLATELET
BASO%: 0.2 % (ref 0.0–2.0)
BASOS ABS: 0 10*3/uL (ref 0.0–0.1)
EOS%: 0.8 % (ref 0.0–7.0)
Eosinophils Absolute: 0.1 10*3/uL (ref 0.0–0.5)
HCT: 41 % (ref 38.4–49.9)
HGB: 13.3 g/dL (ref 13.0–17.1)
LYMPH#: 0.8 10*3/uL — AB (ref 0.9–3.3)
LYMPH%: 9.4 % — AB (ref 14.0–49.0)
MCH: 27.9 pg (ref 27.2–33.4)
MCHC: 32.4 g/dL (ref 32.0–36.0)
MCV: 86 fL (ref 79.3–98.0)
MONO#: 0.7 10*3/uL (ref 0.1–0.9)
MONO%: 8 % (ref 0.0–14.0)
NEUT#: 7.1 10*3/uL — ABNORMAL HIGH (ref 1.5–6.5)
NEUT%: 81.6 % — AB (ref 39.0–75.0)
Platelets: 202 10*3/uL (ref 140–400)
RBC: 4.77 10*6/uL (ref 4.20–5.82)
RDW: 17.9 % — ABNORMAL HIGH (ref 11.0–14.6)
WBC: 8.7 10*3/uL (ref 4.0–10.3)

## 2016-01-03 MED ORDER — IOHEXOL 300 MG/ML  SOLN
75.0000 mL | Freq: Once | INTRAMUSCULAR | Status: AC | PRN
  Administered 2016-01-03: 50 mL via INTRAVENOUS

## 2016-01-04 ENCOUNTER — Ambulatory Visit: Payer: 59 | Admitting: Internal Medicine

## 2016-01-05 ENCOUNTER — Encounter: Payer: 59 | Admitting: Cardiothoracic Surgery

## 2016-01-05 ENCOUNTER — Ambulatory Visit (INDEPENDENT_AMBULATORY_CARE_PROVIDER_SITE_OTHER): Payer: 59 | Admitting: Cardiothoracic Surgery

## 2016-01-05 ENCOUNTER — Encounter: Payer: Self-pay | Admitting: Cardiothoracic Surgery

## 2016-01-05 VITALS — BP 152/86 | HR 110 | Resp 18 | Ht 70.0 in | Wt 126.4 lb

## 2016-01-05 DIAGNOSIS — Z8709 Personal history of other diseases of the respiratory system: Secondary | ICD-10-CM | POA: Diagnosis not present

## 2016-01-05 DIAGNOSIS — C3491 Malignant neoplasm of unspecified part of right bronchus or lung: Secondary | ICD-10-CM

## 2016-01-05 NOTE — Progress Notes (Signed)
[address]       [place] [address]             [phone]      [facility] Medical Record #[id number] Date of Birth: 1941/09/09  Referring: Curt Bears, MD Primary Care: Cammy Copa, MD  Chief Complaint:   Chest tube placement Non-small cell carcinoma of right lung, stage 3 [facility])   Staging form: Lung, AJCC 7th Edition     Clinical stage from 04/21/2015: Stage IIIA (T2a, N2, M0) - Signed by Curt Bears, MD on 04/21/2015  History of Present Illness:     Patient returns to the office after complicated course of multiple right chest tubes pneumothoraces secondary to underlying 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 Early the patient is under no specific therapy. He returns today with a follow-up CT scan. Over the past 2 months he has continued to improve his functional status. He is out exercising. Uses option oxygen with exertion and at nighttime, but is spending more time without it and with decreasing breathlessness. His weight  and nutritional status has remained stable. Overall he notes that his well-being has improved over the past 6-8 weeks. CT scan suggested new right rib fracture, he denies any specific pain in this area or known rib fracture.   Past Medical History  Diagnosis Date  . Spontaneous pneumothorax MANY YRS AGO AND AGAIN 04/04/15    HISTORY OF (ONLY)  . Cancer ([place])     skin cancer  . Lung mass     right / HAS HAD RADIATION AND CHEMO FOR LUNG CANCER  . Inguinal hernia   . Productive cough     "DUE TO RADIATION"  . History of nonmelanoma skin cancer   . Constipation     AFTER ANESTHESIA  . Difficulty sleeping   . Radiation 05/11/15-06/21/15    NSCLCA/right lung  . Pneumothorax on right 08/24/2015     History  Smoking status  . Current Every Day Smoker -- 0.50 packs/day for 55 years  . Types: Cigarettes  Smokeless tobacco  . Never Used    History    Alcohol Use  . Yes    Comment: every other night -social     Allergies  Allergen Reactions  . Aleve [Naproxen] Rash    Current Outpatient Prescriptions  Medication Sig Dispense Refill  . acetaminophen (TYLENOL) 325 MG tablet Take 2 tablets (650 mg total) by mouth every 6 (six) hours as needed for mild pain, moderate pain, fever or headache (or Fever >/= 101).    Marland Kitchen albuterol (PROVENTIL HFA;VENTOLIN HFA) 108 (90 BASE) MCG/ACT inhaler Inhale 1-2 puffs into the lungs every 6 (six) hours as needed for wheezing or shortness of breath. 1 Inhaler 1  . albuterol (PROVENTIL) (2.5 MG/3ML) 0.083% nebulizer solution Take 3 mLs (2.5 mg total) by nebulization every 6 (six) hours as needed for wheezing or shortness of breath (DX: J44.9). 75 mL 5  . ALPRAZolam (XANAX) 0.5 MG tablet Take 1 tablet (0.5 mg total) by mouth 3 (three) times daily. 90 tablet 0  . Ascorbic Acid (VITAMIN C) 1000 MG tablet Take 1,000 mg by mouth daily.    Marland Kitchen b complex vitamins tablet Take 1 tablet by mouth daily.    . budesonide-formoterol (SYMBICORT) 160-4.5 MCG/ACT inhaler Inhale 2 puffs into the lungs 2 (two) times daily. 1 Inhaler 1  . CALCIUM CITRATE PO Take 1 tablet by mouth daily.    Marland Kitchen  Cyanocobalamin (VITAMIN B 12 PO) Take 1 tablet by mouth daily.    Marland Kitchen guaiFENesin (MUCINEX) 600 MG 12 hr tablet Take 1 tablet (600 mg total) by mouth 2 (two) times daily as needed.    Marland Kitchen L-Arginine 1000 MG TABS Take 1,000 mg by mouth daily.    . magic mouthwash SOLN Take 5 mLs by mouth 3 (three) times daily. 1 tsp swish and swallow three times a day 240 mL 3  . magnesium 30 MG tablet Take 30 mg by mouth daily.    Marland Kitchen morphine (MS CONTIN) 15 MG 12 hr tablet Take 1 tablet (15 mg total) by mouth every 12 (twelve) hours. 60 tablet 0  . Multiple Vitamin (MULTI VITAMIN DAILY PO) Take 1 tablet by mouth daily.    . Omega-3 Fatty Acids (FISH OIL) 1000 MG CAPS Take 1,000 mg by mouth daily.    Marland Kitchen oxyCODONE (OXY IR/ROXICODONE) 5 MG immediate release tablet  Take 1-2 tablets (5-10 mg total) by mouth every 6 (six) hours as needed for severe pain. 60 tablet 0  . Tiotropium Bromide Monohydrate (SPIRIVA RESPIMAT) 2.5 MCG/ACT AERS Inhale 2 puffs into the lungs daily. 1 Inhaler 5  . vitamin A 10000 UNIT capsule Take 10,000 Units by mouth daily.    Marland Kitchen saccharomyces boulardii (FLORASTOR) 250 MG capsule Take 250 mg by mouth 2 (two) times daily.     No current facility-administered medications for this visit.       Physical Exam: BP 152/86 mmHg  Pulse 110  Resp 18  Ht '5\' 10"'$  (1.778 m)  Wt 126 lb 6.4 oz (57.335 kg)  BMI 18.14 kg/m2  SpO2 97%  General appearance: alert and cooperative Neurologic: intact Heart: regular rate and rhythm, S1, S2 normal, no murmur, click, rub or gallop Lungs: diminished breath sounds Right upper lobe Abdomen: soft, non-tender; bowel sounds normal; no masses,  no organomegaly Extremities: extremities normal, atraumatic, no cyanosis or edema and Homans sign is negative, no sign of DVT Wound: Chest tube site is completely healed .   Wt Readings from Last 3 Encounters:  01/05/16 126 lb 6.4 oz (57.335 kg)  11/16/15 124 lb (56.246 kg)  11/03/15 126 lb (57.153 kg)     Diagnostic Studies & Laboratory data:     Recent Radiology Findings:  Ct Chest W Contrast  01/03/2016  CLINICAL DATA:  Restaging non-small cell carcinoma of right lung. Diagnosed 6/16. Chemotherapy and radiation therapy 8/16. Cough. Shortness of breath on exertion. EXAM: CT CHEST WITH CONTRAST TECHNIQUE: Multidetector CT imaging of the chest was performed during intravenous contrast administration. CONTRAST:  45m OMNIPAQUE IOHEXOL 300 MG/ML  SOLN COMPARISON:  Plain film 11/03/2015.  Most recent CT of 09/30/2015. FINDINGS: Mediastinum/Nodes: No supraclavicular adenopathy. Mild left-sided gynecomastia. Normal heart size with lipomatous hypertrophy of the interatrial septum. Multivessel coronary artery atherosclerosis. No central pulmonary embolism, on this  non-dedicated study. No well-defined mediastinal adenopathy. Lungs/Pleura: Right-sided pleural fluid is decreased, tiny. Residual right-sided pleural thickening. Decreased in right apical pneumothorax. Probable secretions in the right mainstem bronchus. Moderate centrilobular emphysema. Persistent right apical and upper lobe dense consolidation with improvement in lateral areas of bronchiectasis. Improvement in right lung base aeration and septal thickening. Residual soft tissue thickening about the origin of the right upper lobe bronchus, which may represent residual neoplasia. Example image 32/ series 5. Sub solid left lower lobe pulmonary nodule measures 2.0 x 2.3 cm on image 53/series 5. Compare 1.6 x 1.5 cm on the prior exam. Development of a pleural-based adjacent left  lower lobe pulmonary nodule which measures 12 mm on image 54/series 5. Is also present on coronal image 31. Increased definition of the superior segment left lower lobe partially cavitary sub solid pulmonary nodule. Example 2.9 x 2.5 cm on image 32/series 5. Compare 3.1 x 2.4 cm on the prior. Soft tissue nodule more cephalad in the left lower lobe measures 7 mm on image 25/ series 5 and is new. Posterior left upper lobe sub solid pulmonary nodule measures 2.5 x 1.2 cm on image 17/series 5 and is similar. Scarring versus sub solid pulmonary nodule on the anterior left apex at 1.5 cm on image 8/series 5. 1.3 cm on the prior. Upper abdomen: Old granulomatous disease in the liver. Normal imaged portions of the spleen, stomach, left adrenal gland, and left kidney. Upper pole right renal collecting system calculus. Right suprarenal nodule measures 1.9 cm on image 60/series 2 and is felt to be similar to on the prior exam. This is present back to 04/12/2015 PET, and not hypermetabolic on that study. Musculoskeletal: Healing fracture of the fourth antrolateral right rib on image 33/ series 2. New since the prior. No underlying osseous metastasis on the  prior exam. IMPRESSION: 1. Overall improved appearance of the right hemi thorax. Although there is residual consolidation and bronchiectasis in the right apex and upper lobe, the right lower lobe demonstrates improved aeration with decreased adjacent pleural fluid and thickening. 2. Residual soft tissue fullness about the origin of the right lower lobe bronchus, suspicious for residual neoplasia. 3. Enlarging sub solid and new/enlarging soft tissue nodules in the left lung, suspicious for metastatic disease. 4.  Atherosclerosis, including within the coronary arteries. 5. 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 6. 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 7. New right fourth rib healing fracture. Favored to be posttraumatic. Correlate with interval probable. Electronically Signed   By: Abigail Miyamoto M.D.   On: 01/03/2016 15:03    Ct Chest Wo Contrast  09/30/2015  CLINICAL DATA:  75 year old male with history of pneumothorax. Followup after chest tube removal. 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 EXAM: CT CHEST WITHOUT CONTRAST TECHNIQUE: Multidetector CT imaging of the chest was performed following the standard protocol without IV contrast. COMPARISON:  Chest CT 08/30/2015. FINDINGS: Mediastinum/Lymph Nodes: Heart size is normal. Small amount of pericardial fluid and/or thickening, unlikely to be of any hemodynamic significance at this time. No associated pericardial calcification. There is atherosclerosis of the thoracic aorta, the great vessels of the mediastinum and the coronary arteries, including calcified atherosclerotic plaque in the left main, left anterior descending, left circumflex and right coronary arteries. Calcifications of the aortic valve. Multiple borderline enlarged and mildly enlarged mediastinal lymph nodes are again noted, measuring up to 1.2 cm in short  axis in the right paratracheal nodal station. There is also soft tissue fullness in the right hilar region, suggesting lymphadenopathy (poorly evaluated on today's noncontrast CT examination). Esophagus is unremarkable in appearance. No axillary lymphadenopathy. Lungs/Pleura: When compared to remote prior examination 07/22/2015, there is evidence of severe chronic volume loss in the right hemithorax, which is notably smaller than the contralateral side. There is chronic collapse and consolidation of much of the right lung, most severe in the right upper lobe, where there also appears to be extensive varicose and cystic bronchiectasis. Cavity in the apex of the right lung appears to communicate with a thick walled  cavity in the apex of the right pleural space, best appreciated on image 13 of series 3. This thick-walled cavity in the right pleural space appears contiguous with a tract along the anterior aspect of the right pleural space, extending into the lower right hemithorax, which likely represents the old chest tube tract. Other than this small amount of pleural gas, there is no significant residual pneumothorax. Small chronic thick-walled right pleural effusion posteriorly. Previously demonstrated right middle and lower lobe lesions are now obscured, but as mentioned above, there is extensive soft tissue fullness in the right hilar region, and in the infrahilar aspect of the right lower lobe, which suggests residual neoplasm and/or adenopathy. Again noted is a large mass-like area of ground-glass attenuation, septal thickening and cystic change in the posterior aspect of the superior segment of the left lower lobe (image 30 of series 3), which appears larger than prior examinations, currently measuring 3.1 x 2.4 cm. There is also a irregular ground-glass attenuation lesion with septal thickening in the periphery of the left upper lobe (image 16 of series 3) which measures 2.4 x 1.2 cm, and an irregular sub  solid lesion with spiculated margins in the inferior aspect of the left lower lobe (image 52 of series 3) which measures 15 x 16 mm. Upper Abdomen: 3 mm calcification in the interpolar region of the right kidney may be vascular, or may be a nonobstructive calculus. Musculoskeletal/Soft Tissues: There are no aggressive appearing lytic or blastic lesions noted in the visualized portions of the skeleton. IMPRESSION: 1. Right-sided fibrothorax. 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 2. Worsening chronic consolidation and collapse in the right lung, with extensive nodular septal thickening, and persistent right infrahilar fullness in the central aspect of the right lower lobe, right hilar fullness and mediastinal adenopathy, as above, suggesting residual neoplasm with right hilar and mediastinal lymphadenopathy. 3. Enlarging sub solid lesions throughout the left lung, as above, concerning for multicentric adenocarcinoma. 4. Moderate centrilobular and paraseptal emphysema. 5. Possible 3 mm nonobstructive calculus in the interpolar collecting system of the right kidney. 6. Atherosclerosis, including left main and 3 vessel coronary artery disease. Electronically Signed   By: Vinnie Langton M.D.   On: 09/30/2015 11:16         Recent Lab Findings: Lab Results  Component Value Date   WBC 8.7 01/03/2016   HGB 13.3 01/03/2016   HCT 41.0 01/03/2016   PLT 202 01/03/2016   GLUCOSE 97 01/03/2016   ALT 25 01/03/2016   AST 22 01/03/2016   NA 139 01/03/2016   K 4.2 01/03/2016   CL 102 09/12/2015   CREATININE 1.1 01/03/2016   BUN 32.3* 01/03/2016   CO2 26 01/03/2016   INR 1.10 08/01/2015      Assessment / Plan:  Overall patient is stable without recurrent fever chills or evidence of  infection Currently he is under observation only in regard to his lung malignancy, Chest CT findings suggest some enlargement of lesions in the left lung. I have reviewed the CT scan findings with the patient. He is to see Dr. Earlie Server early next week to discuss the findings and consider what other treatment options are available and indicated. The patient has a particular interest in immune therapy I'll plan to see the patient back in 2 months  Grace Isaac  MD      [address] [place], [address] Office [phone]   Beeper [phone]  01/05/2016 12:20 PM

## 2016-01-10 ENCOUNTER — Telehealth: Payer: Self-pay | Admitting: *Deleted

## 2016-01-10 ENCOUNTER — Telehealth: Payer: Self-pay | Admitting: Internal Medicine

## 2016-01-10 ENCOUNTER — Encounter: Payer: Self-pay | Admitting: Internal Medicine

## 2016-01-10 ENCOUNTER — Ambulatory Visit (HOSPITAL_BASED_OUTPATIENT_CLINIC_OR_DEPARTMENT_OTHER): Payer: 59 | Admitting: Internal Medicine

## 2016-01-10 VITALS — BP 158/80 | HR 107 | Temp 97.6°F | Resp 18 | Ht 70.0 in | Wt 127.5 lb

## 2016-01-10 DIAGNOSIS — R52 Pain, unspecified: Secondary | ICD-10-CM | POA: Diagnosis not present

## 2016-01-10 DIAGNOSIS — R0602 Shortness of breath: Secondary | ICD-10-CM

## 2016-01-10 DIAGNOSIS — R911 Solitary pulmonary nodule: Secondary | ICD-10-CM | POA: Diagnosis not present

## 2016-01-10 DIAGNOSIS — C3491 Malignant neoplasm of unspecified part of right bronchus or lung: Secondary | ICD-10-CM

## 2016-01-10 MED ORDER — MORPHINE SULFATE ER 15 MG PO TBCR: 15.0000 mg | EXTENDED_RELEASE_TABLET | Freq: Two times a day (BID) | ORAL | Status: DC

## 2016-01-10 NOTE — Telephone Encounter (Signed)
Per staff phone call and POF I have schedueld appts. Scheduler advised of appts.  JMW  

## 2016-01-10 NOTE — Progress Notes (Signed)
[facility] Telephone:([phone]) [phone]   [phone]) [phone]  OFFICE PROGRESS NOTE  Cammy Copa, MD [phone] [address] [place] [address]  DIAGNOSIS: Non-small cell carcinoma of right lung, Adenocarcinoma, stage 3  Staging form: Lung, AJCC 7th Edition  Clinical stage from 04/21/2015: Stage IIIA (T2a, N2, M0) - Signed by Curt Bears, MD on 04/21/2015  Foundation One molecular studies reveal that he was positive for [place], [place], and STAG2 [id number]. He was negative for: EGFR, ALK, BRAF, MET, RET, ERBB2 and ROS1  PRIOR THERAPY:  1) Concurrent chemoradiation with chemotherapy in the form of weekly carboplatin for AUC 2 paclitaxel 45 mg/m given concurrent with radiation therapy. First cycle started 05/09/2015. Status post 7 cycles. 2) Consolidation chemotherapy with carboplatin for AUC of 5 on day 1 and gemcitabine 1000 MG/M2 on days 1 and 8 every 3 weeks. First dose 08/15/2015. Discontinued secondary to intolerance.  CURRENT THERAPY: Immunotherapy with Nivolumab 240 MG IV every 2 weeks. First dose 01/18/2016.  INTERVAL HISTORY: Gabriel HOLZMAN75y.o. male returns to the clinic today for follow-up visit accompanied by his daughter JGregary Signsand his son BZorawar The patient is feeling much better today. He gained some weight as well as stamina. He is currently working few days a week. He will also try to exercise at regular basis. He continues to have shortness of breath and currently on home oxygen but uses it only on as-needed basis. He denied having any fever or chills. He has no nausea or vomiting. He had repeat CT scan of the chest performed recently and he is here today for evaluation and recommendation regarding treatment of his condition.  MEDICAL HISTORY: Past Medical History  Diagnosis Date  . Spontaneous pneumothorax MANY YRS AGO AND AGAIN 04/04/15    HISTORY OF (ONLY)  . [place] cancer  . Lung mass     right  / HAS HAD RADIATION AND CHEMO FOR LUNG CANCER  . Inguinal hernia   . Productive cough     "DUE TO RADIATION"  . History of nonmelanoma skin cancer   . Constipation     AFTER ANESTHESIA  . Difficulty sleeping   . Radiation 05/11/15-06/21/15    NSCLCA/right lung  . Pneumothorax on right 08/24/2015    ALLERGIES:  is allergic to aleve.  MEDICATIONS:  Current Outpatient Prescriptions  Medication Sig Dispense Refill  . acetaminophen (TYLENOL) 325 MG tablet Take 2 tablets (650 mg total) by mouth every 6 (six) hours as needed for mild pain, moderate pain, fever or headache (or Fever >/= 101).    .Marland Kitchenalbuterol (PROVENTIL HFA;VENTOLIN HFA) 108 (90 BASE) MCG/ACT inhaler Inhale 1-2 puffs into the lungs every 6 (six) hours as needed for wheezing or shortness of breath. 1 Inhaler 1  . albuterol (PROVENTIL) (2.5 MG/3ML) 0.083% nebulizer solution Take 3 mLs (2.5 mg total) by nebulization every 6 (six) hours as needed for wheezing or shortness of breath (DX: J44.9). 75 mL 5  . ALPRAZolam (XANAX) 0.5 MG tablet Take 1 tablet (0.5 mg total) by mouth 3 (three) times daily. 90 tablet 0  . Ascorbic Acid (VITAMIN C) 1000 MG tablet Take 1,000 mg by mouth daily.    .Marland Kitchenb complex vitamins tablet Take 1 tablet by mouth daily.    . budesonide-formoterol (SYMBICORT) 160-4.5 MCG/ACT inhaler Inhale 2 puffs into the lungs 2 (two) times daily. 1 Inhaler 1  . CALCIUM CITRATE PO Take 1 tablet  by mouth daily.    . Cyanocobalamin (VITAMIN B 12 PO) Take 1 tablet by mouth daily.    Marland Kitchen guaiFENesin (MUCINEX) 600 MG 12 hr tablet Take 1 tablet (600 mg total) by mouth 2 (two) times daily as needed.    Marland Kitchen L-Arginine 1000 MG TABS Take 1,000 mg by mouth daily.    . magic mouthwash SOLN Take 5 mLs by mouth 3 (three) times daily. 1 tsp swish and swallow three times a day 240 mL 3  . magnesium 30 MG tablet Take 30 mg by mouth daily.    Marland Kitchen morphine (MS CONTIN) 15 MG 12 hr tablet Take 1 tablet (15 mg total) by mouth every 12 (twelve) hours. 60  tablet 0  . Multiple Vitamin (MULTI VITAMIN DAILY PO) Take 1 tablet by mouth daily.    . Omega-3 Fatty Acids (FISH OIL) 1000 MG CAPS Take 1,000 mg by mouth daily.    Marland Kitchen oxyCODONE (OXY IR/ROXICODONE) 5 MG immediate release tablet Take 1-2 tablets (5-10 mg total) by mouth every 6 (six) hours as needed for severe pain. 60 tablet 0  . saccharomyces boulardii (FLORASTOR) 250 MG capsule Take 250 mg by mouth 2 (two) times daily.    . Tiotropium Bromide Monohydrate (SPIRIVA RESPIMAT) 2.5 MCG/ACT AERS Inhale 2 puffs into the lungs daily. 1 Inhaler 5  . vitamin A 10000 UNIT capsule Take 10,000 Units by mouth daily.     No current facility-administered medications for this visit.    SURGICAL HISTORY:  Past Surgical History  Procedure Laterality Date  . Hernia repair  2011  . Colonoscopy w/ biopsies and polypectomy    . Wisdom tooth extraction    . Tonsillectomy    . Video bronchoscopy with endobronchial ultrasound N/A 04/18/2015    Procedure: VIDEO BRONCHOSCOPY WITH ENDOBRONCHIAL ULTRASOUND;  Surgeon: Grace Isaac, MD;  Location: [facility];  Service: Thoracic;  Laterality: N/A;  . Inguinal hernia repair Left 07/15/2015    Procedure: OPEN LEFT INGUINAL HERNIA REPAIR;  Surgeon: Johnathan Hausen, MD;  Location: [facility];  Service: General;  Laterality: Left;  With MESH  . Video bronchoscopy with insertion of interbronchial valve (ibv) N/A 08/02/2015    Procedure: VIDEO BRONCHOSCOPY WITH ATTEMPTED INSERTION OF INTERBRONCHIAL VALVE (IBV) WITH FLUROSCOPY;  Surgeon: Grace Isaac, MD;  Location: [facility];  Service: Thoracic;  Laterality: N/A;  . Chest tube insertion Right 08/02/2015    Procedure: INSERTION OF SECOND RIGHT CHEST TUBE WITH FLUROSCOPY;  Surgeon: Grace Isaac, MD;  Location: [facility];  Service: Thoracic;  Laterality: Right;  . Chest tube insertion  07/28/2015    REVIEW OF SYSTEMS:  Constitutional: positive for fatigue Eyes: negative Ears, nose, mouth, throat, and face: negative Respiratory:  positive for dyspnea on exertion Cardiovascular: negative Gastrointestinal: negative Genitourinary:negative Integument/breast: negative Hematologic/lymphatic: negative Musculoskeletal:negative Neurological: negative Behavioral/Psych: negative Endocrine: negative Allergic/Immunologic: negative   PHYSICAL EXAMINATION: General appearance: alert, cooperative and no distress Head: Normocephalic, without obvious abnormality, atraumatic Neck: no adenopathy, no JVD, supple, symmetrical, trachea midline and thyroid not enlarged, symmetric, no tenderness/mass/nodules Lymph nodes: Cervical, supraclavicular, and axillary nodes normal. Resp: clear to auscultation bilaterally Back: symmetric, no curvature. ROM normal. No CVA tenderness. Cardio: regular rate and rhythm, S1, S2 normal, no murmur, click, rub or gallop GI: soft, non-tender; bowel sounds normal; no masses,  no organomegaly Extremities: extremities normal, atraumatic, no cyanosis or edema Neurologic: Alert and oriented X 3, normal strength and tone. Normal symmetric reflexes. Normal coordination and gait  ECOG PERFORMANCE STATUS: 1 -  Symptomatic but completely ambulatory  There were no vitals taken for this visit.  LABORATORY DATA: Lab Results  Component Value Date   WBC 8.7 01/03/2016   HGB 13.3 01/03/2016   HCT 41.0 01/03/2016   MCV 86.0 01/03/2016   PLT 202 01/03/2016      Chemistry      Component Value Date/Time   NA 139 01/03/2016 1313   NA 134* 09/12/2015 0410   K 4.2 01/03/2016 1313   K 3.8 09/12/2015 0410   CL 102 09/12/2015 0410   CO2 26 01/03/2016 1313   CO2 26 09/12/2015 0410   BUN 32.3* 01/03/2016 1313   BUN 14 09/12/2015 0410   CREATININE 1.1 01/03/2016 1313   CREATININE 0.79 09/13/2015 0457      Component Value Date/Time   CALCIUM 10.2 01/03/2016 1313   CALCIUM 8.0* 09/12/2015 0410   ALKPHOS 112 01/03/2016 1313   ALKPHOS 181* 09/02/2015 0950   AST 22 01/03/2016 1313   AST 28 09/02/2015 0950   ALT  25 01/03/2016 1313   ALT 33 09/02/2015 0950   BILITOT 0.22 01/03/2016 1313   BILITOT 1.2 09/02/2015 0950       RADIOGRAPHIC STUDIES: Ct Chest W Contrast  01/03/2016  CLINICAL DATA:  Restaging non-small cell carcinoma of right lung. Diagnosed 6/16. Chemotherapy and radiation therapy 8/16. Cough. Shortness of breath on exertion. EXAM: CT CHEST WITH CONTRAST TECHNIQUE: Multidetector CT imaging of the chest was performed during intravenous contrast administration. CONTRAST:  106m OMNIPAQUE IOHEXOL 300 MG/ML  SOLN COMPARISON:  Plain film 11/03/2015.  Most recent CT of 09/30/2015. FINDINGS: Mediastinum/Nodes: No supraclavicular adenopathy. Mild left-sided gynecomastia. Normal heart size with lipomatous hypertrophy of the interatrial septum. Multivessel coronary artery atherosclerosis. No central pulmonary embolism, on this non-dedicated study. No well-defined mediastinal adenopathy. Lungs/Pleura: Right-sided pleural fluid is decreased, tiny. Residual right-sided pleural thickening. Decreased in right apical pneumothorax. Probable secretions in the right mainstem bronchus. Moderate centrilobular emphysema. Persistent right apical and upper lobe dense consolidation with improvement in lateral areas of bronchiectasis. Improvement in right lung base aeration and septal thickening. Residual soft tissue thickening about the origin of the right upper lobe bronchus, which may represent residual neoplasia. Example image 32/ series 5. Sub solid left lower lobe pulmonary nodule measures 2.0 x 2.3 cm on image 53/series 5. Compare 1.6 x 1.5 cm on the prior exam. Development of a pleural-based adjacent left lower lobe pulmonary nodule which measures 12 mm on image 54/series 5. Is also present on coronal image 31. Increased definition of the superior segment left lower lobe partially cavitary sub solid pulmonary nodule. Example 2.9 x 2.5 cm on image 32/series 5. Compare 3.1 x 2.4 cm on the prior. Soft tissue nodule more  cephalad in the left lower lobe measures 7 mm on image 25/ series 5 and is new. Posterior left upper lobe sub solid pulmonary nodule measures 2.5 x 1.2 cm on image 17/series 5 and is similar. Scarring versus sub solid pulmonary nodule on the anterior left apex at 1.5 cm on image 8/series 5. 1.3 cm on the prior. Upper abdomen: Old granulomatous disease in the liver. Normal imaged portions of the spleen, stomach, left adrenal gland, and left kidney. Upper pole right renal collecting system calculus. Right suprarenal nodule measures 1.9 cm on image 60/series 2 and is felt to be similar to on the prior exam. This is present back to 04/12/2015 PET, and not hypermetabolic on that study. Musculoskeletal: Healing fracture of the fourth antrolateral right rib on image 33/  series 2. New since the prior. No underlying osseous metastasis on the prior exam. IMPRESSION: 1. Overall improved appearance of the right hemi thorax. Although there is residual consolidation and bronchiectasis in the right apex and upper lobe, the right lower lobe demonstrates improved aeration with decreased adjacent pleural fluid and thickening. 2. Residual soft tissue fullness about the origin of the right lower lobe bronchus, suspicious for residual neoplasia. 3. Enlarging sub solid and new/enlarging soft tissue nodules in the left lung, suspicious for metastatic disease. 4.  Atherosclerosis, including within the coronary arteries. 5. 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 6. 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 7. New right fourth rib healing fracture. Favored to be posttraumatic. Correlate with interval probable. Electronically Signed   By: Abigail Miyamoto M.D.   On: 01/03/2016 15:03    ASSESSMENT AND PLAN: This is a very pleasant 75 years old white male with stage IIIA non-small cell lung cancer. He is status post a course of  concurrent chemoradiation with weekly carboplatin and paclitaxel with partial response. This was followed by 1 cycle of consolidation chemotherapy with carboplatin and gemcitabine but this was discontinued secondary to right pneumothorax and frequent hospitalization. The patient has been on observation for the last few months. The recent CT scan of the chest showed improvement of the right hemithorax but there was enlarging subsided and new/enlarging soft tissue nodule in the left lung suspicious for metastatic disease. I discussed the scan results and showed the images to the patient and his children. I gave him the option of close monitoring and observation versus proceeding with systemic therapy either with immunotherapy or single agent Alimta. I discussed with the patient the 2 options in details and he is interested in considering the treatment with immunotherapy. I recommended for him treatment with Nivolumab 240 MG IV every 2 weeks. I discussed with the patient adverse effect of the immunotherapy including but not limited to immune mediated pneumonitis, colitis, liver, renal, skin, thyroid or other endocrine dysfunction. He is expected to start the first cycle of this treatment on 01/18/2016. For pain management, he will continue on his current pain medication and I gave him a refill of OxyContin.  The patient would come back for follow-up visit in 3 weeks for reevaluation before starting cycle #2. The patient was advised to call immediately if he has any concerning symptoms in the interval. The patient and his children agreed to the current plan. He was advised to call immediately if he has any concerning symptoms in the interval. The patient voices understanding of current disease status and treatment options and is in agreement with the current care plan.  All questions were answered. The patient knows to call the clinic with any problems, questions or concerns. We can certainly see the  patient much sooner if necessary.  Disclaimer: This note was dictated with voice recognition software. Similar sounding words can inadvertently be transcribed and may not be corrected upon review.

## 2016-01-10 NOTE — Telephone Encounter (Signed)
per pof to sch pt appt-[facility] sch trmt-gave pt copy of avs

## 2016-01-18 ENCOUNTER — Other Ambulatory Visit: Payer: Self-pay | Admitting: Medical Oncology

## 2016-01-18 ENCOUNTER — Other Ambulatory Visit (HOSPITAL_BASED_OUTPATIENT_CLINIC_OR_DEPARTMENT_OTHER): Payer: 59

## 2016-01-18 ENCOUNTER — Ambulatory Visit (HOSPITAL_BASED_OUTPATIENT_CLINIC_OR_DEPARTMENT_OTHER): Payer: 59

## 2016-01-18 VITALS — BP 142/70 | HR 100 | Temp 98.0°F | Resp 18

## 2016-01-18 DIAGNOSIS — Z5112 Encounter for antineoplastic immunotherapy: Secondary | ICD-10-CM

## 2016-01-18 DIAGNOSIS — C3491 Malignant neoplasm of unspecified part of right bronchus or lung: Secondary | ICD-10-CM | POA: Diagnosis not present

## 2016-01-18 LAB — CBC WITH DIFFERENTIAL/PLATELET
BASO%: 0.7 % (ref 0.0–2.0)
Basophils Absolute: 0.1 10*3/uL (ref 0.0–0.1)
EOS ABS: 0.1 10*3/uL (ref 0.0–0.5)
EOS%: 1.2 % (ref 0.0–7.0)
HCT: 42.8 % (ref 38.4–49.9)
HEMOGLOBIN: 14.1 g/dL (ref 13.0–17.1)
LYMPH#: 0.9 10*3/uL (ref 0.9–3.3)
LYMPH%: 9.6 % — AB (ref 14.0–49.0)
MCH: 28.2 pg (ref 27.2–33.4)
MCHC: 33 g/dL (ref 32.0–36.0)
MCV: 85.6 fL (ref 79.3–98.0)
MONO#: 0.7 10*3/uL (ref 0.1–0.9)
MONO%: 7.2 % (ref 0.0–14.0)
NEUT%: 81.3 % — ABNORMAL HIGH (ref 39.0–75.0)
NEUTROS ABS: 7.8 10*3/uL — AB (ref 1.5–6.5)
PLATELETS: 231 10*3/uL (ref 140–400)
RBC: 4.99 10*6/uL (ref 4.20–5.82)
RDW: 17.6 % — AB (ref 11.0–14.6)
WBC: 9.6 10*3/uL (ref 4.0–10.3)

## 2016-01-18 LAB — COMPREHENSIVE METABOLIC PANEL
ALBUMIN: 4 g/dL (ref 3.5–5.0)
ALK PHOS: 117 U/L (ref 40–150)
ALT: 22 U/L (ref 0–55)
ANION GAP: 9 meq/L (ref 3–11)
AST: 21 U/L (ref 5–34)
BUN: 33 mg/dL — ABNORMAL HIGH (ref 7.0–26.0)
CO2: 28 meq/L (ref 22–29)
CREATININE: 1.2 mg/dL (ref 0.7–1.3)
Calcium: 10.4 mg/dL (ref 8.4–10.4)
Chloride: 102 mEq/L (ref 98–109)
EGFR: 62 mL/min/{1.73_m2} — ABNORMAL LOW (ref >90)
GLUCOSE: 97 mg/dL (ref 70–140)
Potassium: 3.9 mEq/L (ref 3.5–5.1)
Sodium: 139 mEq/L (ref 136–145)
TOTAL PROTEIN: 9.1 g/dL — AB (ref 6.4–8.3)

## 2016-01-18 MED ORDER — NIVOLUMAB CHEMO INJECTION 100 MG/10ML
240.0000 mg | Freq: Once | INTRAVENOUS | Status: AC
  Administered 2016-01-18: 240 mg via INTRAVENOUS
  Filled 2016-01-18: qty 20

## 2016-01-18 MED ORDER — SODIUM CHLORIDE 0.9 % IV SOLN
Freq: Once | INTRAVENOUS | Status: AC
  Administered 2016-01-18: 13:00:00 via INTRAVENOUS

## 2016-01-18 NOTE — Progress Notes (Unsigned)
Pt was seen by pharmacist to discuss about the research trial on infusing nivolumab for 30 minutes, instead of 16mn. Pt was given information and agreeable to treatment conditions.   1425- Pt did very well with Nivolumab infusion. Denies any complaints at this time. AVS printed out for pt. Reinforced possible side effects and allergic reactions that may occur post infusion. Pt aware of where and when to call for immediate concerns. Encourage pt to increase hydration intake after nivolumab and to report s/s as needed.

## 2016-01-18 NOTE — Patient Instructions (Signed)
Nivolumab injection What is this medicine? NIVOLUMAB (nye VOL ue mab) is a monoclonal antibody. It is used to treat melanoma, lung cancer, kidney cancer, and Hodgkin lymphoma. This medicine may be used for other purposes; ask your health care provider or pharmacist if you have questions. What should I tell my health care provider before I take this medicine? They need to know if you have any of these conditions: -diabetes -immune system problems -kidney disease -liver disease -lung disease -organ transplant -stomach or intestine problems -thyroid disease -an unusual or allergic reaction to nivolumab, other medicines, foods, dyes, or preservatives -pregnant or trying to get pregnant -breast-feeding How should I use this medicine? This medicine is for infusion into a vein. It is given by a health care professional in a hospital or clinic setting. A special MedGuide will be given to you before each treatment. Be sure to read this information carefully each time. Talk to your pediatrician regarding the use of this medicine in children. Special care may be needed. Overdosage: If you think you have taken too much of this medicine contact a poison control center or emergency room at once. NOTE: This medicine is only for you. Do not share this medicine with others. What if I miss a dose? It is important not to miss your dose. Call your doctor or health care professional if you are unable to keep an appointment. What may interact with this medicine? Interactions have not been studied. Give your health care provider a list of all the medicines, herbs, non-prescription drugs, or dietary supplements you use. Also tell them if you smoke, drink alcohol, or use illegal drugs. Some items may interact with your medicine. This list may not describe all possible interactions. Give your health care provider a list of all the medicines, herbs, non-prescription drugs, or dietary supplements you use. Also tell  them if you smoke, drink alcohol, or use illegal drugs. Some items may interact with your medicine. What should I watch for while using this medicine? This drug may make you feel generally unwell. Continue your course of treatment even though you feel ill unless your doctor tells you to stop. You may need blood work done while you are taking this medicine. Do not become pregnant while taking this medicine or for 5 months after stopping it. Women should inform their doctor if they wish to become pregnant or think they might be pregnant. There is a potential for serious side effects to an unborn child. Talk to your health care professional or pharmacist for more information. Do not breast-feed an infant while taking this medicine. What side effects may I notice from receiving this medicine? Side effects that you should report to your doctor or health care professional as soon as possible: -allergic reactions like skin rash, itching or hives, swelling of the face, lips, or tongue -black, tarry stools -blood in the urine -bloody or watery diarrhea -changes in vision -change in sex drive -changes in emotions or moods -chest pain -confusion -cough -decreased appetite -diarrhea -facial flushing -feeling faint or lightheaded -fever, chills -hair loss -hallucination, loss of contact with reality -headache -irritable -joint pain -loss of memory -muscle pain -muscle weakness -seizures -shortness of breath -signs and symptoms of high blood sugar such as dizziness; dry mouth; dry skin; fruity breath; nausea; stomach pain; increased hunger or thirst; increased urination -signs and symptoms of kidney injury like trouble passing urine or change in the amount of urine -signs and symptoms of liver injury like dark yellow or  brown urine; general ill feeling or flu-like symptoms; light-colored stools; loss of appetite; nausea; right upper belly pain; unusually weak or tired; yellowing of the eyes or  skin -stiff neck -swelling of the ankles, feet, hands -weight gain Side effects that usually do not require medical attention (report to your doctor or health care professional if they continue or are bothersome): -bone pain -constipation -tiredness -vomiting This list may not describe all possible side effects. Call your doctor for medical advice about side effects. You may report side effects to FDA at 1-800-FDA-1088. Where should I keep my medicine? This drug is given in a hospital or clinic and will not be stored at home. NOTE: This sheet is a summary. It may not cover all possible information. If you have questions about this medicine, talk to your doctor, pharmacist, or health care provider.    2016, Elsevier/Gold Standard. (2015-03-16 10:03:42)

## 2016-02-01 ENCOUNTER — Other Ambulatory Visit: Payer: Self-pay | Admitting: Medical Oncology

## 2016-02-01 ENCOUNTER — Encounter: Payer: Self-pay | Admitting: Internal Medicine

## 2016-02-01 ENCOUNTER — Other Ambulatory Visit (HOSPITAL_BASED_OUTPATIENT_CLINIC_OR_DEPARTMENT_OTHER): Payer: 59

## 2016-02-01 ENCOUNTER — Ambulatory Visit (HOSPITAL_BASED_OUTPATIENT_CLINIC_OR_DEPARTMENT_OTHER): Payer: 59 | Admitting: Internal Medicine

## 2016-02-01 ENCOUNTER — Telehealth: Payer: Self-pay | Admitting: *Deleted

## 2016-02-01 ENCOUNTER — Ambulatory Visit (HOSPITAL_BASED_OUTPATIENT_CLINIC_OR_DEPARTMENT_OTHER): Payer: 59

## 2016-02-01 ENCOUNTER — Telehealth: Payer: Self-pay | Admitting: Internal Medicine

## 2016-02-01 VITALS — BP 137/70 | HR 98 | Temp 98.0°F | Resp 18 | Ht 70.0 in | Wt 129.7 lb

## 2016-02-01 DIAGNOSIS — Z79899 Other long term (current) drug therapy: Secondary | ICD-10-CM | POA: Diagnosis not present

## 2016-02-01 DIAGNOSIS — C3491 Malignant neoplasm of unspecified part of right bronchus or lung: Secondary | ICD-10-CM | POA: Diagnosis not present

## 2016-02-01 DIAGNOSIS — R911 Solitary pulmonary nodule: Secondary | ICD-10-CM | POA: Diagnosis not present

## 2016-02-01 DIAGNOSIS — R0602 Shortness of breath: Secondary | ICD-10-CM | POA: Diagnosis not present

## 2016-02-01 DIAGNOSIS — R52 Pain, unspecified: Secondary | ICD-10-CM

## 2016-02-01 DIAGNOSIS — Z5112 Encounter for antineoplastic immunotherapy: Secondary | ICD-10-CM | POA: Diagnosis not present

## 2016-02-01 LAB — COMPREHENSIVE METABOLIC PANEL
ALBUMIN: 3.8 g/dL (ref 3.5–5.0)
ALK PHOS: 114 U/L (ref 40–150)
ALT: 21 U/L (ref 0–55)
AST: 23 U/L (ref 5–34)
Anion Gap: 7 mEq/L (ref 3–11)
BUN: 32.5 mg/dL — ABNORMAL HIGH (ref 7.0–26.0)
CHLORIDE: 108 meq/L (ref 98–109)
CO2: 27 mEq/L (ref 22–29)
Calcium: 10.3 mg/dL (ref 8.4–10.4)
Creatinine: 1.2 mg/dL (ref 0.7–1.3)
EGFR: 58 mL/min/{1.73_m2} — AB (ref >90)
GLUCOSE: 104 mg/dL (ref 70–140)
POTASSIUM: 4 meq/L (ref 3.5–5.1)
SODIUM: 142 meq/L (ref 136–145)
Total Bilirubin: <0.3 mg/dL (ref 0.20–1.20)
Total Protein: 8.9 g/dL — ABNORMAL HIGH (ref 6.4–8.3)

## 2016-02-01 LAB — CBC WITH DIFFERENTIAL/PLATELET
BASO%: 0.7 % (ref 0.0–2.0)
BASOS ABS: 0.1 10*3/uL (ref 0.0–0.1)
EOS ABS: 0.1 10*3/uL (ref 0.0–0.5)
EOS%: 1.5 % (ref 0.0–7.0)
HCT: 43.2 % (ref 38.4–49.9)
HGB: 14.2 g/dL (ref 13.0–17.1)
LYMPH#: 0.9 10*3/uL (ref 0.9–3.3)
LYMPH%: 9.5 % — AB (ref 14.0–49.0)
MCH: 28.1 pg (ref 27.2–33.4)
MCHC: 32.8 g/dL (ref 32.0–36.0)
MCV: 85.7 fL (ref 79.3–98.0)
MONO#: 0.7 10*3/uL (ref 0.1–0.9)
MONO%: 7 % (ref 0.0–14.0)
NEUT#: 7.8 10*3/uL — ABNORMAL HIGH (ref 1.5–6.5)
NEUT%: 81.3 % — AB (ref 39.0–75.0)
Platelets: 210 10*3/uL (ref 140–400)
RBC: 5.05 10*6/uL (ref 4.20–5.82)
RDW: 16.6 % — AB (ref 11.0–14.6)
WBC: 9.5 10*3/uL (ref 4.0–10.3)

## 2016-02-01 LAB — TSH: TSH: 1.187 m(IU)/L (ref 0.320–4.118)

## 2016-02-01 MED ORDER — SODIUM CHLORIDE 0.9 % IV SOLN
240.0000 mg | Freq: Once | INTRAVENOUS | Status: AC
  Administered 2016-02-01: 240 mg via INTRAVENOUS
  Filled 2016-02-01: qty 20

## 2016-02-01 MED ORDER — SODIUM CHLORIDE 0.9 % IV SOLN
Freq: Once | INTRAVENOUS | Status: AC
  Administered 2016-02-01: 13:00:00 via INTRAVENOUS

## 2016-02-01 NOTE — Patient Instructions (Signed)
[facility] Discharge Instructions for Patients Receiving Chemotherapy  Today you received the following chemotherapy agents:  Nivolumab.  To help prevent nausea and vomiting after your treatment, we encourage you to take your nausea medication as directed.   If you develop nausea and vomiting that is not controlled by your nausea medication, call the clinic.   BELOW ARE SYMPTOMS THAT SHOULD BE REPORTED IMMEDIATELY:  *FEVER GREATER THAN 100.5 F  *CHILLS WITH OR WITHOUT FEVER  NAUSEA AND VOMITING THAT IS NOT CONTROLLED WITH YOUR NAUSEA MEDICATION  *UNUSUAL SHORTNESS OF BREATH  *UNUSUAL BRUISING OR BLEEDING  TENDERNESS IN MOUTH AND THROAT WITH OR WITHOUT PRESENCE OF ULCERS  *URINARY PROBLEMS  *BOWEL PROBLEMS  UNUSUAL RASH Items with * indicate a potential emergency and should be followed up as soon as possible.  Feel free to call the clinic you have any questions or concerns. The clinic phone number is [phone].  Please show the CHEMO ALERT CARD at check-in to the Emergency Department and triage nurse.   

## 2016-02-01 NOTE — Telephone Encounter (Signed)
per pof to sch pt appt-sent MW email to sch trmt-pt to get updated copy b4 leaving

## 2016-02-01 NOTE — Progress Notes (Signed)
[place] [phone]   [phone]  OFFICE PROGRESS NOTE  Cammy Copa, MD [phone] [address], [address] [place] [address]  DIAGNOSIS: Non-small cell carcinoma of right lung, Adenocarcinoma, stage 3  Staging form: Lung, AJCC 7th Edition  Clinical stage from 04/21/2015: Stage IIIA (T2a, N2, M0) - Signed by Curt Bears, MD on 04/21/2015  Foundation One molecular studies reveal that he was positive for [place], [place], and STAG2 [id number]. He was negative for: EGFR, ALK, BRAF, MET, RET, ERBB2 and ROS1  PRIOR THERAPY:  1) Concurrent chemoradiation with chemotherapy in the form of weekly carboplatin for AUC 2 paclitaxel 45 mg/m given concurrent with radiation therapy. First cycle started 05/09/2015. Status post 7 cycles. 2) Consolidation chemotherapy with carboplatin for AUC of 5 on day 1 and gemcitabine 1000 MG/M2 on days 1 and 8 every 3 weeks. First dose 08/15/2015. Discontinued secondary to intolerance.  CURRENT THERAPY: Immunotherapy with Nivolumab 240 MG IV every 2 weeks. First dose 01/18/2016. Status post one cycle.  INTERVAL HISTORY: Gabriel VATH75y.o. male returns to the clinic today for follow-up visit accompanied by his daughter [name] The patient is feeling much better today. He tolerated the first cycle of his immunotherapy with Nivolumab fairly well with no significant adverse effects. He denied having any significant skin rash. He denied having any nausea, vomiting, diarrhea or constipation. He continues to work few days a week. He continues to have shortness of breath and currently on home oxygen but uses it only on as-needed basis. He denied having any fever or chills. He has no nausea or vomiting. He is here today to start cycle #2 of his immunotherapy.  MEDICAL HISTORY: Past Medical History  Diagnosis Date  . Spontaneous pneumothorax MANY YRS AGO AND AGAIN 04/04/15    HISTORY OF (ONLY)  . Cancer  ([place]     skin cancer  . Lung mass     right / HAS HAD RADIATION AND CHEMO FOR LUNG CANCER  . Inguinal hernia   . Productive cough     "DUE TO RADIATION"  . History of nonmelanoma skin cancer   . Constipation     AFTER ANESTHESIA  . Difficulty sleeping   . Radiation 05/11/15-06/21/15    NSCLCA/right lung  . Pneumothorax on right 08/24/2015    ALLERGIES:  is allergic to aleve.  MEDICATIONS:  Current Outpatient Prescriptions  Medication Sig Dispense Refill  . acetaminophen (TYLENOL) 325 MG tablet Take 2 tablets (650 mg total) by mouth every 6 (six) hours as needed for mild pain, moderate pain, fever or headache (or Fever >/= 101).    .Marland Kitchenalbuterol (PROVENTIL HFA;VENTOLIN HFA) 108 (90 BASE) MCG/ACT inhaler Inhale 1-2 puffs into the lungs every 6 (six) hours as needed for wheezing or shortness of breath. (Patient not taking: Reported on 01/10/2016) 1 Inhaler 1  . albuterol (PROVENTIL) (2.5 MG/3ML) 0.083% nebulizer solution Take 3 mLs (2.5 mg total) by nebulization every 6 (six) hours as needed for wheezing or shortness of breath (DX: J44.9). (Patient not taking: Reported on 01/10/2016) 75 mL 5  . ALPRAZolam (XANAX) 0.5 MG tablet Take 1 tablet (0.5 mg total) by mouth 3 (three) times daily. 90 tablet 0  . amoxicillin-clavulanate (AUGMENTIN) 875-125 MG tablet Take 1 tablet by mouth 2 (two) times daily.  0  . Ascorbic Acid (VITAMIN C) 1000 MG tablet Take 1,000 mg by mouth daily.    .Marland Kitchenb complex vitamins tablet Take 1 tablet  by mouth daily.    . budesonide-formoterol (SYMBICORT) 160-4.5 MCG/ACT inhaler Inhale 2 puffs into the lungs 2 (two) times daily. 1 Inhaler 1  . CALCIUM CITRATE PO Take 1 tablet by mouth daily.    . Cyanocobalamin (VITAMIN B 12 PO) Take 1 tablet by mouth daily.    Marland Kitchen guaiFENesin (MUCINEX) 600 MG 12 hr tablet Take 1 tablet (600 mg total) by mouth 2 (two) times daily as needed.    . magic mouthwash SOLN Take 5 mLs by mouth 3 (three) times daily. 1 tsp swish and swallow three times a  day 240 mL 3  . magnesium 30 MG tablet Take 30 mg by mouth daily.    Marland Kitchen morphine (MS CONTIN) 15 MG 12 hr tablet Take 1 tablet (15 mg total) by mouth every 12 (twelve) hours. 60 tablet 0  . Multiple Vitamin (MULTI VITAMIN DAILY PO) Take 1 tablet by mouth daily.    . Omega-3 Fatty Acids (FISH OIL) 1000 MG CAPS Take 1,000 mg by mouth daily.    Marland Kitchen oxyCODONE (OXY IR/ROXICODONE) 5 MG immediate release tablet Take 1-2 tablets (5-10 mg total) by mouth every 6 (six) hours as needed for severe pain. (Patient not taking: Reported on 01/10/2016) 60 tablet 0  . saccharomyces boulardii (FLORASTOR) 250 MG capsule Take 250 mg by mouth 2 (two) times daily.    . Tiotropium Bromide Monohydrate (SPIRIVA RESPIMAT) 2.5 MCG/ACT AERS Inhale 2 puffs into the lungs daily. 1 Inhaler 5  . vitamin A 10000 UNIT capsule Take 10,000 Units by mouth daily.     No current facility-administered medications for this visit.    SURGICAL HISTORY:  Past Surgical History  Procedure Laterality Date  . Hernia repair  2011  . Colonoscopy w/ biopsies and polypectomy    . Wisdom tooth extraction    . Tonsillectomy    . Video bronchoscopy with endobronchial ultrasound N/A 04/18/2015    Procedure: VIDEO BRONCHOSCOPY WITH ENDOBRONCHIAL ULTRASOUND;  Surgeon: Grace Isaac, MD;  Location: [facility];  Service: Thoracic;  Laterality: N/A;  . Inguinal hernia repair Left 07/15/2015    Procedure: OPEN LEFT INGUINAL HERNIA REPAIR;  Surgeon: Johnathan Hausen, MD;  Location: [facility];  Service: General;  Laterality: Left;  With MESH  . Video bronchoscopy with insertion of interbronchial valve (ibv) N/A 08/02/2015    Procedure: VIDEO BRONCHOSCOPY WITH ATTEMPTED INSERTION OF INTERBRONCHIAL VALVE (IBV) WITH FLUROSCOPY;  Surgeon: Grace Isaac, MD;  Location: [facility];  Service: Thoracic;  Laterality: N/A;  . Chest tube insertion Right 08/02/2015    Procedure: INSERTION OF SECOND RIGHT CHEST TUBE WITH FLUROSCOPY;  Surgeon: Grace Isaac, MD;  Location: [facility];  Service: Thoracic;  Laterality: Right;  . Chest tube insertion  07/28/2015    REVIEW OF SYSTEMS:  A comprehensive review of systems was negative except for: Constitutional: positive for fatigue and weight loss Respiratory: positive for cough, dyspnea on exertion and sputum   PHYSICAL EXAMINATION: General appearance: alert, cooperative and no distress Head: Normocephalic, without obvious abnormality, atraumatic Neck: no adenopathy, no JVD, supple, symmetrical, trachea midline and thyroid not enlarged, symmetric, no tenderness/mass/nodules Lymph nodes: Cervical, supraclavicular, and axillary nodes normal. Resp: clear to auscultation bilaterally Back: symmetric, no curvature. ROM normal. No CVA tenderness. Cardio: regular rate and rhythm, S1, S2 normal, no murmur, click, rub or gallop GI: soft, non-tender; bowel sounds normal; no masses,  no organomegaly Extremities: extremities normal, atraumatic, no cyanosis or edema Neurologic: Alert and oriented X 3, normal strength and  tone. Normal symmetric reflexes. Normal coordination and gait  ECOG PERFORMANCE STATUS: 1 - Symptomatic but completely ambulatory  Blood pressure 137/70, pulse 98, temperature 98 F (36.7 C), temperature source Oral, resp. rate 18, height 5' 10"  (1.778 m), weight 129 lb 11.2 oz (58.832 kg), SpO2 98 %.  LABORATORY DATA: Lab Results  Component Value Date   WBC 9.5 02/01/2016   HGB 14.2 02/01/2016   HCT 43.2 02/01/2016   MCV 85.7 02/01/2016   PLT 210 02/01/2016      Chemistry      Component Value Date/Time   NA 142 02/01/2016 1138   NA 134* 09/12/2015 0410   K 4.0 02/01/2016 1138   K 3.8 09/12/2015 0410   CL 102 09/12/2015 0410   CO2 27 02/01/2016 1138   CO2 26 09/12/2015 0410   BUN 32.5* 02/01/2016 1138   BUN 14 09/12/2015 0410   CREATININE 1.2 02/01/2016 1138   CREATININE 0.79 09/13/2015 0457      Component Value Date/Time   CALCIUM 10.3 02/01/2016 1138   CALCIUM 8.0* 09/12/2015 0410   ALKPHOS  114 02/01/2016 1138   ALKPHOS 181* 09/02/2015 0950   AST 23 02/01/2016 1138   AST 28 09/02/2015 0950   ALT 21 02/01/2016 1138   ALT 33 09/02/2015 0950   BILITOT <0.30 02/01/2016 1138   BILITOT 1.2 09/02/2015 0950       RADIOGRAPHIC STUDIES: Ct Chest W Contrast  01/03/2016  CLINICAL DATA:  Restaging non-small cell carcinoma of right lung. Diagnosed 6/16. Chemotherapy and radiation therapy 8/16. Cough. Shortness of breath on exertion. EXAM: CT CHEST WITH CONTRAST TECHNIQUE: Multidetector CT imaging of the chest was performed during intravenous contrast administration. CONTRAST:  39m OMNIPAQUE IOHEXOL 300 MG/ML  SOLN COMPARISON:  Plain film 11/03/2015.  Most recent CT of 09/30/2015. FINDINGS: Mediastinum/Nodes: No supraclavicular adenopathy. Mild left-sided gynecomastia. Normal heart size with lipomatous hypertrophy of the interatrial septum. Multivessel coronary artery atherosclerosis. No central pulmonary embolism, on this non-dedicated study. No well-defined mediastinal adenopathy. Lungs/Pleura: Right-sided pleural fluid is decreased, tiny. Residual right-sided pleural thickening. Decreased in right apical pneumothorax. Probable secretions in the right mainstem bronchus. Moderate centrilobular emphysema. Persistent right apical and upper lobe dense consolidation with improvement in lateral areas of bronchiectasis. Improvement in right lung base aeration and septal thickening. Residual soft tissue thickening about the origin of the right upper lobe bronchus, which may represent residual neoplasia. Example image 32/ series 5. Sub solid left lower lobe pulmonary nodule measures 2.0 x 2.3 cm on image 53/series 5. Compare 1.6 x 1.5 cm on the prior exam. Development of a pleural-based adjacent left lower lobe pulmonary nodule which measures 12 mm on image 54/series 5. Is also present on coronal image 31. Increased definition of the superior segment left lower lobe partially cavitary sub solid pulmonary  nodule. Example 2.9 x 2.5 cm on image 32/series 5. Compare 3.1 x 2.4 cm on the prior. Soft tissue nodule more cephalad in the left lower lobe measures 7 mm on image 25/ series 5 and is new. Posterior left upper lobe sub solid pulmonary nodule measures 2.5 x 1.2 cm on image 17/series 5 and is similar. Scarring versus sub solid pulmonary nodule on the anterior left apex at 1.5 cm on image 8/series 5. 1.3 cm on the prior. Upper abdomen: Old granulomatous disease in the liver. Normal imaged portions of the spleen, stomach, left adrenal gland, and left kidney. Upper pole right renal collecting system calculus. Right suprarenal nodule measures 1.9 cm on image 60/series  2 and is felt to be similar to on the prior exam. This is present back to 04/12/2015 PET, and not hypermetabolic on that study. Musculoskeletal: Healing fracture of the fourth antrolateral right rib on image 33/ series 2. New since the prior. No underlying osseous metastasis on the prior exam. IMPRESSION: 1. Overall improved appearance of the right hemi thorax. Although there is residual consolidation and bronchiectasis in the right apex and upper lobe, the right lower lobe demonstrates improved aeration with decreased adjacent pleural fluid and thickening. 2. Residual soft tissue fullness about the origin of the right lower lobe bronchus, suspicious for residual neoplasia. 3. Enlarging sub solid and new/enlarging soft tissue nodules in the left lung, suspicious for metastatic disease. 4.  Atherosclerosis, including within the coronary arteries. 5. 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 6. 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 7. New right fourth rib healing fracture. Favored to be posttraumatic. Correlate with interval probable. Electronically Signed   By: Abigail Miyamoto M.D.   On: 01/03/2016 15:03    ASSESSMENT AND PLAN: This is a very  pleasant 75 years old white male with stage IIIA non-small cell lung cancer. He is status post a course of concurrent chemoradiation with weekly carboplatin and paclitaxel with partial response. This was followed by 1 cycle of consolidation chemotherapy with carboplatin and gemcitabine but this was discontinued secondary to right pneumothorax and frequent hospitalization. The patient has been on observation for the last few months. The recent CT scan of the chest showed improvement of the right hemithorax but there was enlarging subsided and new/enlarging soft tissue nodule in the left lung suspicious for metastatic disease. The patient is currently undergoing treatment with immunotherapy with Nivolumab status post 1 cycle. He tolerated the first cycle of his treatment fairly well. I recommended for him to proceed with cycle #2 today as a scheduled. For pain management, he will continue on his current pain medication and I gave him a refill of OxyContin.  The patient would come back for follow-up visit in 2 weeks for reevaluation before starting cycle #3. The patient was advised to call immediately if he has any concerning symptoms in the interval. The patient and his children agreed to the current plan. He was advised to call immediately if he has any concerning symptoms in the interval. The patient voices understanding of current disease status and treatment options and is in agreement with the current care plan.  All questions were answered. The patient knows to call the clinic with any problems, questions or concerns. We can certainly see the patient much sooner if necessary.  Disclaimer: This note was dictated with voice recognition software. Similar sounding words can inadvertently be transcribed and may not be corrected upon review.

## 2016-02-01 NOTE — Telephone Encounter (Signed)
Per staff message and POF I have scheduled appts. Advised scheduler of appts. JMW  

## 2016-02-01 NOTE — Patient Instructions (Signed)
Smoking Cessation, Tips for Success If you are ready to quit smoking, congratulations! You have chosen to help yourself be healthier. Cigarettes bring nicotine, tar, carbon monoxide, and other irritants into your body. Your lungs, heart, and blood vessels will be able to work better without these poisons. There are many different ways to quit smoking. Nicotine gum, nicotine patches, a nicotine inhaler, or nicotine nasal spray can help with physical craving. Hypnosis, support groups, and medicines help break the habit of smoking. WHAT THINGS CAN I DO TO MAKE QUITTING EASIER?  Here are some tips to help you quit for good:  Pick a date when you will quit smoking completely. Tell all of your friends and family about your plan to quit on that date.  Do not try to slowly cut down on the number of cigarettes you are smoking. Pick a quit date and quit smoking completely starting on that day.  Throw away all cigarettes.   Clean and remove all ashtrays from your home, work, and car.  On a card, write down your reasons for quitting. Carry the card with you and read it when you get the urge to smoke.  Cleanse your body of nicotine. Drink enough water and fluids to keep your urine clear or pale yellow. Do this after quitting to flush the nicotine from your body.  Learn to predict your moods. Do not let a bad situation be your excuse to have a cigarette. Some situations in your life might tempt you into wanting a cigarette.  Never have "just one" cigarette. It leads to wanting another and another. Remind yourself of your decision to quit.  Change habits associated with smoking. If you smoked while driving or when feeling stressed, try other activities to replace smoking. Stand up when drinking your coffee. Brush your teeth after eating. Sit in a different chair when you read the paper. Avoid alcohol while trying to quit, and try to drink fewer caffeinated beverages. Alcohol and caffeine may urge you to  smoke.  Avoid foods and drinks that can trigger a desire to smoke, such as sugary or spicy foods and alcohol.  Ask people who smoke not to smoke around you.  Have something planned to do right after eating or having a cup of coffee. For example, plan to take a walk or exercise.  Try a relaxation exercise to calm you down and decrease your stress. Remember, you may be tense and nervous for the first 2 weeks after you quit, but this will pass.  Find new activities to keep your hands busy. Play with a pen, coin, or rubber band. Doodle or draw things on paper.  Brush your teeth right after eating. This will help cut down on the craving for the taste of tobacco after meals. You can also try mouthwash.   Use oral substitutes in place of cigarettes. Try using lemon drops, carrots, cinnamon sticks, or chewing gum. Keep them handy so they are available when you have the urge to smoke.  When you have the urge to smoke, try deep breathing.  Designate your home as a nonsmoking area.  If you are a heavy smoker, ask your health care provider about a prescription for nicotine chewing gum. It can ease your withdrawal from nicotine.  Reward yourself. Set aside the cigarette money you save and buy yourself something nice.  Look for support from others. Join a support group or smoking cessation program. Ask someone at home or at work to help you with your plan   to quit smoking.  Always ask yourself, "Do I need this cigarette or is this just a reflex?" Tell yourself, "Today, I choose not to smoke," or "I do not want to smoke." You are reminding yourself of your decision to quit.  Do not replace cigarette smoking with electronic cigarettes (commonly called e-cigarettes). The safety of e-cigarettes is unknown, and some may contain harmful chemicals.  If you relapse, do not give up! Plan ahead and think about what you will do the next time you get the urge to smoke. HOW WILL I FEEL WHEN I QUIT SMOKING? You  may have symptoms of withdrawal because your body is used to nicotine (the addictive substance in cigarettes). You may crave cigarettes, be irritable, feel very hungry, cough often, get headaches, or have difficulty concentrating. The withdrawal symptoms are only temporary. They are strongest when you first quit but will go away within 10-14 days. When withdrawal symptoms occur, stay in control. Think about your reasons for quitting. Remind yourself that these are signs that your body is healing and getting used to being without cigarettes. Remember that withdrawal symptoms are easier to treat than the major diseases that smoking can cause.  Even after the withdrawal is over, expect periodic urges to smoke. However, these cravings are generally short lived and will go away whether you smoke or not. Do not smoke! WHAT RESOURCES ARE AVAILABLE TO HELP ME QUIT SMOKING? Your health care provider can direct you to community resources or hospitals for support, which may include:  Group support.  Education.  Hypnosis.  Therapy.   This information is not intended to replace advice given to you by your health care provider. Make sure you discuss any questions you have with your health care provider.   Document Released: 07/13/2004 Document Revised: 11/05/2014 Document Reviewed: 04/02/2013 Elsevier Interactive Patient Education 2016 [employer]  

## 2016-02-14 ENCOUNTER — Ambulatory Visit (INDEPENDENT_AMBULATORY_CARE_PROVIDER_SITE_OTHER): Payer: 59 | Admitting: Internal Medicine

## 2016-02-14 ENCOUNTER — Encounter: Payer: Self-pay | Admitting: Internal Medicine

## 2016-02-14 VITALS — BP 144/80 | HR 100 | Ht 70.0 in | Wt 132.6 lb

## 2016-02-14 DIAGNOSIS — J449 Chronic obstructive pulmonary disease, unspecified: Secondary | ICD-10-CM

## 2016-02-14 DIAGNOSIS — R490 Dysphonia: Secondary | ICD-10-CM | POA: Insufficient documentation

## 2016-02-14 DIAGNOSIS — Z23 Encounter for immunization: Secondary | ICD-10-CM | POA: Diagnosis not present

## 2016-02-14 NOTE — Patient Instructions (Addendum)
ICD-9-CM ICD-10-CM   1. Chronic obstructive pulmonary disease, unspecified COPD type ([place]) 496 J44.9   2. Hoarseness of voice 784.42 R49.0    #Hoarse voice - refer [facility] ENT - Dr  Erik Obey or a colleague  #COpD  - stable  PLAN Continue symbicort Have prevnar vaccine 02/14/2016 Walk test on room air for oxygen level and Overnight oxygen test on room air for oxygen levels -   - O2 decision based on these test results Respect your desire not to attend pulmonary rehab due to work issues  - instead do home aerobic and light weight exercises  - monitor Heart rate and maybe even pulse ox with this - goal pulse ox > 88%  #Followup  3 months   - CAT score at followup

## 2016-02-14 NOTE — Progress Notes (Signed)
Subjective:     Patient ID: Gabriel [name], male   DOB: [birth year], 75 y.o.   MRN: [id number]  HPI   OV 02/14/2016  Chief Complaint  Patient presents with  . Follow-up    Pt states his breathing has improved since last OV with TP in Jan 2017. Pt states he does not have as much chest congestion. Pt c/o prod cough with white to yellow mucus. Pt denies CP/tightness.    COPD patient who in 2016 developed advanced lung cancer. 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 This was then complicated by right-sided pneumonia and high oxygenating hypoxemic respiratory failure. All this resulted in significant deconditioning. After that I did not see him. In the interim he was seen by my nurse practitioner. Today he comes for follow-up with his daughter Almyra Free and his son. Compared to when I saw him in the fall 2016 he looks remarkably better. In fact he is using oxygen at night and in the daytime and he feels he does not need it. He is back doing his job. For his lung cancer he is on immunotherapy. He's received 2 infusions so far. His functional status is improved dramatically. He is always not been interested and pneumonia vaccines but today at my discussion he will have one. Walking desaturation test 185 feet 3 laps on room and did not desaturate. He is not interested in pulmonary rehabilitation due to work conflicts but he will try exercising on his own.  His main issue is that he's having some cough with mucus production which is overall improved compared to fall 2016. He thinks it is still improving. He discussed options of getting it but wants to not wait until it resolves by itself.  Other issue that he got hoarse voice since fall 2016. The symptoms despite taking thrush treatment he is on Symbicort. He used to see ENT many years ago for ear problem but the hoarseness of voice has not been evaluated by ENT   has a past  medical history of Spontaneous pneumothorax (MANY YRS AGO AND AGAIN 04/04/15); Cancer ([place]); Lung mass; Inguinal hernia; Productive cough; History of nonmelanoma skin cancer; Constipation; Difficulty sleeping; Radiation (05/11/15-06/21/15); and Pneumothorax on right (08/24/2015).   reports that he has been smoking Cigarettes.  He has a 27.5 pack-year smoking history. He has never used smokeless tobacco.  Past Surgical History  Procedure Laterality Date  . Hernia repair  2011  . Colonoscopy w/ biopsies and polypectomy    . Wisdom tooth extraction    . Tonsillectomy    . Video bronchoscopy with endobronchial ultrasound N/A 04/18/2015    Procedure: VIDEO BRONCHOSCOPY WITH ENDOBRONCHIAL ULTRASOUND;  Surgeon: Grace Isaac, MD;  Location: [place];  Service: Thoracic;  Laterality: N/A;  . Inguinal hernia repair Left 07/15/2015    Procedure: OPEN LEFT INGUINAL HERNIA REPAIR;  Surgeon: Johnathan Hausen, MD;  Location: [facility];  Service: General;  Laterality: Left;  With MESH  . Video bronchoscopy with insertion of interbronchial valve (ibv) N/A 08/02/2015    Procedure: VIDEO BRONCHOSCOPY WITH ATTEMPTED INSERTION OF INTERBRONCHIAL VALVE (IBV) WITH FLUROSCOPY;  Surgeon: Grace Isaac, MD;  Location: [place] [place];  Service: Thoracic;  Laterality: N/A;  . Chest tube insertion Right 08/02/2015    Procedure: INSERTION OF SECOND RIGHT CHEST TUBE WITH FLUROSCOPY;  Surgeon: Grace Isaac, MD;  Location: [place];  Service: Thoracic;  Laterality: Right;  . Chest tube  insertion  07/28/2015    Allergies  Allergen Reactions  . Aleve [Naproxen] Rash     There is no immunization history on file for this patient.  Family History  Problem Relation Age of Onset  . Heart attack Father   . Hypertension Mother   . Heart disease Father    There is no immunization history on file for this patient.    Current outpatient prescriptions:  .  acetaminophen (TYLENOL) 325 MG tablet, Take 2 tablets (650 mg total) by mouth  every 6 (six) hours as needed for mild pain, moderate pain, fever or headache (or Fever >/= 101)., Disp: , Rfl:  .  albuterol (PROVENTIL HFA;VENTOLIN HFA) 108 (90 BASE) MCG/ACT inhaler, Inhale 1-2 puffs into the lungs every 6 (six) hours as needed for wheezing or shortness of breath. (Patient not taking: Reported on 01/10/2016), Disp: 1 Inhaler, Rfl: 1 .  albuterol (PROVENTIL) (2.5 MG/3ML) 0.083% nebulizer solution, Take 3 mLs (2.5 mg total) by nebulization every 6 (six) hours as needed for wheezing or shortness of breath (DX: J44.9). (Patient not taking: Reported on 01/10/2016), Disp: 75 mL, Rfl: 5 .  ALPRAZolam (XANAX) 0.5 MG tablet, Take 1 tablet (0.5 mg total) by mouth 3 (three) times daily., Disp: 90 tablet, Rfl: 0 .  Ascorbic Acid (VITAMIN C) 1000 MG tablet, Take 1,000 mg by mouth daily., Disp: , Rfl:  .  b complex vitamins tablet, Take 1 tablet by mouth daily., Disp: , Rfl:  .  budesonide-formoterol (SYMBICORT) 160-4.5 MCG/ACT inhaler, Inhale 2 puffs into the lungs 2 (two) times daily., Disp: 1 Inhaler, Rfl: 1 .  CALCIUM CITRATE PO, Take 1 tablet by mouth daily., Disp: , Rfl:  .  Cyanocobalamin (VITAMIN B 12 PO), Take 1 tablet by mouth daily., Disp: , Rfl:  .  guaiFENesin (MUCINEX) 600 MG 12 hr tablet, Take 1 tablet (600 mg total) by mouth 2 (two) times daily as needed., Disp: , Rfl:  .  magic mouthwash SOLN, Take 5 mLs by mouth 3 (three) times daily. 1 tsp swish and swallow three times a day, Disp: 240 mL, Rfl: 3 .  magnesium 30 MG tablet, Take 30 mg by mouth daily., Disp: , Rfl:  .  morphine (MS CONTIN) 15 MG 12 hr tablet, Take 1 tablet (15 mg total) by mouth every 12 (twelve) hours., Disp: 60 tablet, Rfl: 0 .  Multiple Vitamin (MULTI VITAMIN DAILY PO), Take 1 tablet by mouth daily., Disp: , Rfl:  .  Omega-3 Fatty Acids (FISH OIL) 1000 MG CAPS, Take 1,000 mg by mouth daily., Disp: , Rfl:  .  oxyCODONE (OXY IR/ROXICODONE) 5 MG immediate release tablet, Take 1-2 tablets (5-10 mg total) by mouth  every 6 (six) hours as needed for severe pain. (Patient not taking: Reported on 01/10/2016), Disp: 60 tablet, Rfl: 0 .  saccharomyces boulardii (FLORASTOR) 250 MG capsule, Take 250 mg by mouth 2 (two) times daily., Disp: , Rfl:  .  vitamin A 10000 UNIT capsule, Take 10,000 Units by mouth daily., Disp: , Rfl:     Review of Systems     Objective:   Physical Exam  Filed Vitals:   02/14/16 1622  BP: 144/80  Pulse: 100  Height: '5\' 10"'$  (1.778 m)  Weight: 132 lb 9.6 oz (60.147 kg)  SpO2: 96%   Generl: Looks significantly much better than when I saw him in September of October 2016 Respiratory exam: Clear to auscultation bilaterally. Right-sided has mild bronchial breath sound quality Chest exam: Significantly volume loss  on the right side drooped shoulders and scoliosis Cardiovascular: Normal heart sounds regular rate and rhythm Abdomen: Soft nontender no organomegaly Neurologic: Alert and oriented 3. Speech normal. Musculoskeletal no cyanosis no clubbing no edema. Looks physically stronger than several months ago Skin exam: Intact on exposed areas.     Assessment:       ICD-9-CM ICD-10-CM   1. Chronic obstructive pulmonary disease, unspecified COPD type ([place]) 496 J44.9   2. Hoarseness of voice 784.42 R49.0        Plan:      #Hoarse voice - refer [facility] ENT - Dr  Erik Obey or a colleague  #COpD  - stable  PLAN Continue symbicort Have prevnar vaccine 02/14/2016 Walk test on room air for oxygen level and Overnight oxygen test on room air for oxygen levels -   - O2 decision based on these test results Respect your desire not to attend pulmonary rehab due to work issues  - instead do home aerobic and light weight exercises  - monitor Heart rate and maybe even pulse ox with this - goal pulse ox > 88%  #Followup  3 months   - CAT score at followup     > 50% of this > 25 min visit spent in face to face counseling or coordination of care     Dr. Brand Males, M.D., [facility].C.P Pulmonary and Critical Care Medicine Staff Physician [facility] Pager: [phone], If no answer or between  15:00h - 7:00h: call [phone]  02/14/2016 5:24 PM

## 2016-02-15 ENCOUNTER — Other Ambulatory Visit (HOSPITAL_BASED_OUTPATIENT_CLINIC_OR_DEPARTMENT_OTHER): Payer: 59

## 2016-02-15 ENCOUNTER — Ambulatory Visit (HOSPITAL_BASED_OUTPATIENT_CLINIC_OR_DEPARTMENT_OTHER): Payer: 59 | Admitting: Internal Medicine

## 2016-02-15 ENCOUNTER — Telehealth: Payer: Self-pay | Admitting: *Deleted

## 2016-02-15 ENCOUNTER — Encounter: Payer: Self-pay | Admitting: Internal Medicine

## 2016-02-15 ENCOUNTER — Ambulatory Visit (HOSPITAL_BASED_OUTPATIENT_CLINIC_OR_DEPARTMENT_OTHER): Payer: 59

## 2016-02-15 ENCOUNTER — Telehealth: Payer: Self-pay | Admitting: Internal Medicine

## 2016-02-15 VITALS — BP 149/75 | HR 85 | Temp 97.9°F | Resp 18 | Ht 70.0 in | Wt 131.7 lb

## 2016-02-15 DIAGNOSIS — R0609 Other forms of dyspnea: Secondary | ICD-10-CM

## 2016-02-15 DIAGNOSIS — R911 Solitary pulmonary nodule: Secondary | ICD-10-CM

## 2016-02-15 DIAGNOSIS — R52 Pain, unspecified: Secondary | ICD-10-CM

## 2016-02-15 DIAGNOSIS — C3491 Malignant neoplasm of unspecified part of right bronchus or lung: Secondary | ICD-10-CM | POA: Diagnosis not present

## 2016-02-15 DIAGNOSIS — Z5112 Encounter for antineoplastic immunotherapy: Secondary | ICD-10-CM | POA: Diagnosis not present

## 2016-02-15 DIAGNOSIS — R490 Dysphonia: Secondary | ICD-10-CM

## 2016-02-15 LAB — COMPREHENSIVE METABOLIC PANEL
ALT: 19 U/L (ref 0–55)
AST: 22 U/L (ref 5–34)
Albumin: 3.7 g/dL (ref 3.5–5.0)
Alkaline Phosphatase: 107 U/L (ref 40–150)
Anion Gap: 9 mEq/L (ref 3–11)
BUN: 31.2 mg/dL — ABNORMAL HIGH (ref 7.0–26.0)
CHLORIDE: 107 meq/L (ref 98–109)
CO2: 27 meq/L (ref 22–29)
Calcium: 10 mg/dL (ref 8.4–10.4)
Creatinine: 1.2 mg/dL (ref 0.7–1.3)
EGFR: 59 mL/min/{1.73_m2} — AB (ref >90)
GLUCOSE: 90 mg/dL (ref 70–140)
POTASSIUM: 3.9 meq/L (ref 3.5–5.1)
SODIUM: 143 meq/L (ref 136–145)
TOTAL PROTEIN: 8.5 g/dL — AB (ref 6.4–8.3)

## 2016-02-15 LAB — CBC WITH DIFFERENTIAL/PLATELET
BASO%: 0.8 % (ref 0.0–2.0)
Basophils Absolute: 0.1 10*3/uL (ref 0.0–0.1)
EOS ABS: 0.3 10*3/uL (ref 0.0–0.5)
EOS%: 2.6 % (ref 0.0–7.0)
HCT: 41.9 % (ref 38.4–49.9)
HGB: 13.6 g/dL (ref 13.0–17.1)
LYMPH%: 9 % — AB (ref 14.0–49.0)
MCH: 28.3 pg (ref 27.2–33.4)
MCHC: 32.6 g/dL (ref 32.0–36.0)
MCV: 86.8 fL (ref 79.3–98.0)
MONO#: 0.6 10*3/uL (ref 0.1–0.9)
MONO%: 6.5 % (ref 0.0–14.0)
NEUT%: 81.1 % — ABNORMAL HIGH (ref 39.0–75.0)
NEUTROS ABS: 8.1 10*3/uL — AB (ref 1.5–6.5)
Platelets: 197 10*3/uL (ref 140–400)
RBC: 4.82 10*6/uL (ref 4.20–5.82)
RDW: 16.1 % — ABNORMAL HIGH (ref 11.0–14.6)
WBC: 10 10*3/uL (ref 4.0–10.3)
lymph#: 0.9 10*3/uL (ref 0.9–3.3)

## 2016-02-15 MED ORDER — SODIUM CHLORIDE 0.9 % IV SOLN
Freq: Once | INTRAVENOUS | Status: AC
  Administered 2016-02-15: 13:00:00 via INTRAVENOUS

## 2016-02-15 MED ORDER — SODIUM CHLORIDE 0.9 % IV SOLN
240.0000 mg | Freq: Once | INTRAVENOUS | Status: AC
  Administered 2016-02-15: 240 mg via INTRAVENOUS
  Filled 2016-02-15: qty 20

## 2016-02-15 NOTE — Progress Notes (Signed)
[place] Telephone:(336) [phone]   [phone]) [phone]  OFFICE PROGRESS NOTE  Cammy Copa, MD [phone] [address], [address] [place] [address]  DIAGNOSIS: Non-small cell carcinoma of right lung, Adenocarcinoma, stage 3  Staging form: Lung, AJCC 7th Edition  Clinical stage from 04/21/2015: Stage IIIA (T2a, N2, M0) - Signed by Curt Bears, MD on 04/21/2015  Foundation One molecular studies reveal that he was positive for [place], [place], and STAG2 S1040f*20. He was negative for: EGFR, ALK, BRAF, MET, RET, ERBB2 and ROS1  PRIOR THERAPY:  1) Concurrent chemoradiation with chemotherapy in the form of weekly carboplatin for AUC 2 paclitaxel 45 mg/m given concurrent with radiation therapy. First cycle started 05/09/2015. Status post 7 cycles. 2) Consolidation chemotherapy with carboplatin for AUC of 5 on day 1 and gemcitabine 1000 MG/M2 on days 1 and 8 every 3 weeks. First dose 08/15/2015. Discontinued secondary to intolerance.  CURRENT THERAPY: Immunotherapy with Nivolumab 240 MG IV every 2 weeks. First dose 01/18/2016. Status post 2 cycles.  INTERVAL HISTORY: Gabriel DROTAR89y.o. male returns to the clinic today for follow-up visit accompanied by [name] The patient has no complaints today. He tolerated the second cycle of his immunotherapy with Nivolumab fairly well with no significant adverse effects. He denied having any significant skin rash. He denied having any nausea, vomiting, diarrhea or constipation. He gained few pounds since his last visit. He continues to have shortness of breath only with exertion. He denied having any fever or chills. He has no nausea or vomiting. He is here today to start cycle #3 of his immunotherapy.  MEDICAL HISTORY: Past Medical History  Diagnosis Date  . Spontaneous pneumothorax MANY YRS AGO AND AGAIN 04/04/15    HISTORY OF (ONLY)  . [place]  . Lung mass     right  / HAS HAD RADIATION AND CHEMO FOR LUNG CANCER  . Inguinal hernia   . Productive cough     "DUE TO RADIATION"  . History of nonmelanoma skin cancer   . Constipation     AFTER ANESTHESIA  . Difficulty sleeping   . Radiation 05/11/15-06/21/15    NSCLCA/right lung  . Pneumothorax on right 08/24/2015    ALLERGIES:  is allergic to aleve.  MEDICATIONS:  Current Outpatient Prescriptions  Medication Sig Dispense Refill  . acetaminophen (TYLENOL) 325 MG tablet Take 2 tablets (650 mg total) by mouth every 6 (six) hours as needed for mild pain, moderate pain, fever or headache (or Fever >/= 101).    .Marland Kitchenalbuterol (PROVENTIL HFA;VENTOLIN HFA) 108 (90 BASE) MCG/ACT inhaler Inhale 1-2 puffs into the lungs every 6 (six) hours as needed for wheezing or shortness of breath. 1 Inhaler 1  . albuterol (PROVENTIL) (2.5 MG/3ML) 0.083% nebulizer solution Take 3 mLs (2.5 mg total) by nebulization every 6 (six) hours as needed for wheezing or shortness of breath (DX: J44.9). 75 mL 5  . ALPRAZolam (XANAX) 0.5 MG tablet Take 1 tablet (0.5 mg total) by mouth 3 (three) times daily. 90 tablet 0  . Ascorbic Acid (VITAMIN C) 1000 MG tablet Take 1,000 mg by mouth daily.    .Marland Kitchenb complex vitamins tablet Take 1 tablet by mouth daily.    . budesonide-formoterol (SYMBICORT) 160-4.5 MCG/ACT inhaler Inhale 2 puffs into the lungs 2 (two) times daily. 1 Inhaler 1  . CALCIUM CITRATE PO Take 1 tablet by mouth daily.    .Marland Kitchen  Cyanocobalamin (VITAMIN B 12 PO) Take 1 tablet by mouth daily.    Marland Kitchen guaiFENesin (MUCINEX) 600 MG 12 hr tablet Take 1 tablet (600 mg total) by mouth 2 (two) times daily as needed.    . magic mouthwash SOLN Take 5 mLs by mouth 3 (three) times daily. 1 tsp swish and swallow three times a day 240 mL 3  . magnesium 30 MG tablet Take 30 mg by mouth daily.    Marland Kitchen morphine (MS CONTIN) 15 MG 12 hr tablet Take 1 tablet (15 mg total) by mouth every 12 (twelve) hours. 60 tablet 0  . Multiple Vitamin (MULTI VITAMIN DAILY PO) Take 1  tablet by mouth daily.    . Omega-3 Fatty Acids (FISH OIL) 1000 MG CAPS Take 1,000 mg by mouth daily.    Marland Kitchen saccharomyces boulardii (FLORASTOR) 250 MG capsule Take 250 mg by mouth 2 (two) times daily.    . vitamin A 10000 UNIT capsule Take 10,000 Units by mouth daily.    Marland Kitchen SPIRIVA RESPIMAT 2.5 MCG/ACT AERS      No current facility-administered medications for this visit.    SURGICAL HISTORY:  Past Surgical History  Procedure Laterality Date  . Hernia repair  2011  . Colonoscopy w/ biopsies and polypectomy    . Wisdom tooth extraction    . Tonsillectomy    . Video bronchoscopy with endobronchial ultrasound N/A 04/18/2015    Procedure: VIDEO BRONCHOSCOPY WITH ENDOBRONCHIAL ULTRASOUND;  Surgeon: Grace Isaac, MD;  Location: [facility];  Service: Thoracic;  Laterality: N/A;  . Inguinal hernia repair Left 07/15/2015    Procedure: OPEN LEFT INGUINAL HERNIA REPAIR;  Surgeon: Johnathan Hausen, MD;  Location: [facility];  Service: General;  Laterality: Left;  With MESH  . Video bronchoscopy with insertion of interbronchial valve (ibv) N/A 08/02/2015    Procedure: VIDEO BRONCHOSCOPY WITH ATTEMPTED INSERTION OF INTERBRONCHIAL VALVE (IBV) WITH FLUROSCOPY;  Surgeon: Grace Isaac, MD;  Location: [facility];  Service: Thoracic;  Laterality: N/A;  . Chest tube insertion Right 08/02/2015    Procedure: INSERTION OF SECOND RIGHT CHEST TUBE WITH FLUROSCOPY;  Surgeon: Grace Isaac, MD;  Location: [facility];  Service: Thoracic;  Laterality: Right;  . Chest tube insertion  07/28/2015    REVIEW OF SYSTEMS:  A comprehensive review of systems was negative except for: Respiratory: positive for dyspnea on exertion   PHYSICAL EXAMINATION: General appearance: alert, cooperative and no distress Head: Normocephalic, without obvious abnormality, atraumatic Neck: no adenopathy, no JVD, supple, symmetrical, trachea midline and thyroid not enlarged, symmetric, no tenderness/mass/nodules Lymph nodes: Cervical, supraclavicular, and  axillary nodes normal. Resp: clear to auscultation bilaterally Back: symmetric, no curvature. ROM normal. No CVA tenderness. Cardio: regular rate and rhythm, S1, S2 normal, no murmur, click, rub or gallop GI: soft, non-tender; bowel sounds normal; no masses,  no organomegaly Extremities: extremities normal, atraumatic, no cyanosis or edema Neurologic: Alert and oriented X 3, normal strength and tone. Normal symmetric reflexes. Normal coordination and gait  ECOG PERFORMANCE STATUS: 1 - Symptomatic but completely ambulatory  Blood pressure 149/75, pulse 85, temperature 97.9 F (36.6 C), temperature source Oral, resp. rate 18, height 5' 10"  (1.778 m), weight 131 lb 11.2 oz (59.739 kg), SpO2 98 %.  LABORATORY DATA: Lab Results  Component Value Date   WBC 10.0 02/15/2016   HGB 13.6 02/15/2016   HCT 41.9 02/15/2016   MCV 86.8 02/15/2016   PLT 197 02/15/2016      Chemistry      Component  Value Date/Time   NA 142 02/01/2016 1138   NA 134* 09/12/2015 0410   K 4.0 02/01/2016 1138   K 3.8 09/12/2015 0410   CL 102 09/12/2015 0410   CO2 27 02/01/2016 1138   CO2 26 09/12/2015 0410   BUN 32.5* 02/01/2016 1138   BUN 14 09/12/2015 0410   CREATININE 1.2 02/01/2016 1138   CREATININE 0.79 09/13/2015 0457      Component Value Date/Time   CALCIUM 10.3 02/01/2016 1138   CALCIUM 8.0* 09/12/2015 0410   ALKPHOS 114 02/01/2016 1138   ALKPHOS 181* 09/02/2015 0950   AST 23 02/01/2016 1138   AST 28 09/02/2015 0950   ALT 21 02/01/2016 1138   ALT 33 09/02/2015 0950   BILITOT <0.30 02/01/2016 1138   BILITOT 1.2 09/02/2015 0950       RADIOGRAPHIC STUDIES: No results found.  ASSESSMENT AND PLAN: This is a very pleasant 75 years old white male with stage IIIA non-small cell lung cancer. He is status post a course of concurrent chemoradiation with weekly carboplatin and paclitaxel with partial response. This was followed by 1 cycle of consolidation chemotherapy with carboplatin and gemcitabine  but this was discontinued secondary to right pneumothorax and frequent hospitalization. The patient has been on observation for the last few months. The recent CT scan of the chest showed improvement of the right hemithorax but there was enlarging subsided and new/enlarging soft tissue nodule in the left lung suspicious for metastatic disease. The patient is currently undergoing treatment with immunotherapy with Nivolumab status post 2 cycles. He tolerated the second cycle of his treatment fairly well. I recommended for him to proceed with cycle #3 today as a scheduled. For pain management, he will continue on his current pain medication. The patient would come back for follow-up visit in 2 weeks for reevaluation before starting cycle #4. The patient was advised to call immediately if he has any concerning symptoms in the interval. The patient and his children agreed to the current plan. He was advised to call immediately if he has any concerning symptoms in the interval. The patient voices understanding of current disease status and treatment options and is in agreement with the current care plan.  All questions were answered. The patient knows to call the clinic with any problems, questions or concerns. We can certainly see the patient much sooner if necessary.  Disclaimer: This note was dictated with voice recognition software. Similar sounding words can inadvertently be transcribed and may not be corrected upon review.

## 2016-02-15 NOTE — Telephone Encounter (Signed)
per pof to sch pt appt-gave pt copy of avs-sent MW emailt o sch trmt

## 2016-02-15 NOTE — Telephone Encounter (Signed)
Per staff message and POF I have scheduled appts. Advised scheduler of appts. JMW  

## 2016-02-15 NOTE — Patient Instructions (Signed)
[facility] Discharge Instructions for Patients Receiving Chemotherapy  Today you received the following chemotherapy agents nivolumab   To help prevent nausea and vomiting after your treatment, we encourage you to take your nausea medication as directed   If you develop nausea and vomiting that is not controlled by your nausea medication, call the clinic.   BELOW ARE SYMPTOMS THAT SHOULD BE REPORTED IMMEDIATELY:  *FEVER GREATER THAN 100.5 F  *CHILLS WITH OR WITHOUT FEVER  NAUSEA AND VOMITING THAT IS NOT CONTROLLED WITH YOUR NAUSEA MEDICATION  *UNUSUAL SHORTNESS OF BREATH  *UNUSUAL BRUISING OR BLEEDING  TENDERNESS IN MOUTH AND THROAT WITH OR WITHOUT PRESENCE OF ULCERS  *URINARY PROBLEMS  *BOWEL PROBLEMS  UNUSUAL RASH Items with * indicate a potential emergency and should be followed up as soon as possible.  Feel free to call the clinic you have any questions or concerns. The clinic phone number is [phone].  

## 2016-02-21 ENCOUNTER — Other Ambulatory Visit: Payer: Self-pay | Admitting: *Deleted

## 2016-02-21 DIAGNOSIS — J449 Chronic obstructive pulmonary disease, unspecified: Secondary | ICD-10-CM

## 2016-02-21 MED ORDER — BUDESONIDE-FORMOTEROL FUMARATE 160-4.5 MCG/ACT IN AERO: 2.0000 | INHALATION_SPRAY | Freq: Two times a day (BID) | RESPIRATORY_TRACT | Status: DC

## 2016-02-29 ENCOUNTER — Ambulatory Visit (HOSPITAL_BASED_OUTPATIENT_CLINIC_OR_DEPARTMENT_OTHER): Payer: 59 | Admitting: Internal Medicine

## 2016-02-29 ENCOUNTER — Other Ambulatory Visit: Payer: Self-pay | Admitting: Medical Oncology

## 2016-02-29 ENCOUNTER — Other Ambulatory Visit (HOSPITAL_BASED_OUTPATIENT_CLINIC_OR_DEPARTMENT_OTHER): Payer: 59

## 2016-02-29 ENCOUNTER — Ambulatory Visit (HOSPITAL_BASED_OUTPATIENT_CLINIC_OR_DEPARTMENT_OTHER): Payer: 59

## 2016-02-29 ENCOUNTER — Telehealth: Payer: Self-pay | Admitting: Internal Medicine

## 2016-02-29 ENCOUNTER — Encounter: Payer: Self-pay | Admitting: Internal Medicine

## 2016-02-29 VITALS — BP 141/72 | HR 99 | Temp 98.2°F | Resp 18 | Ht 70.0 in | Wt 130.5 lb

## 2016-02-29 DIAGNOSIS — C3491 Malignant neoplasm of unspecified part of right bronchus or lung: Secondary | ICD-10-CM

## 2016-02-29 DIAGNOSIS — R0609 Other forms of dyspnea: Secondary | ICD-10-CM | POA: Diagnosis not present

## 2016-02-29 DIAGNOSIS — R490 Dysphonia: Secondary | ICD-10-CM

## 2016-02-29 DIAGNOSIS — R52 Pain, unspecified: Secondary | ICD-10-CM

## 2016-02-29 DIAGNOSIS — Z5112 Encounter for antineoplastic immunotherapy: Secondary | ICD-10-CM

## 2016-02-29 LAB — CBC WITH DIFFERENTIAL/PLATELET
BASO%: 0.9 % (ref 0.0–2.0)
Basophils Absolute: 0.1 10*3/uL (ref 0.0–0.1)
EOS ABS: 0.2 10*3/uL (ref 0.0–0.5)
EOS%: 1.8 % (ref 0.0–7.0)
HCT: 42.9 % (ref 38.4–49.9)
HEMOGLOBIN: 14 g/dL (ref 13.0–17.1)
LYMPH#: 0.9 10*3/uL (ref 0.9–3.3)
LYMPH%: 9.3 % — ABNORMAL LOW (ref 14.0–49.0)
MCH: 28.5 pg (ref 27.2–33.4)
MCHC: 32.7 g/dL (ref 32.0–36.0)
MCV: 87.2 fL (ref 79.3–98.0)
MONO#: 0.6 10*3/uL (ref 0.1–0.9)
MONO%: 6 % (ref 0.0–14.0)
NEUT%: 82 % — ABNORMAL HIGH (ref 39.0–75.0)
NEUTROS ABS: 7.8 10*3/uL — AB (ref 1.5–6.5)
Platelets: 203 10*3/uL (ref 140–400)
RBC: 4.91 10*6/uL (ref 4.20–5.82)
RDW: 15.4 % — AB (ref 11.0–14.6)
WBC: 9.5 10*3/uL (ref 4.0–10.3)

## 2016-02-29 LAB — COMPREHENSIVE METABOLIC PANEL
ALBUMIN: 3.9 g/dL (ref 3.5–5.0)
ALK PHOS: 107 U/L (ref 40–150)
ALT: 23 U/L (ref 0–55)
AST: 22 U/L (ref 5–34)
Anion Gap: 9 mEq/L (ref 3–11)
BUN: 30.8 mg/dL — AB (ref 7.0–26.0)
CO2: 26 mEq/L (ref 22–29)
Calcium: 10.3 mg/dL (ref 8.4–10.4)
Chloride: 105 mEq/L (ref 98–109)
Creatinine: 1.2 mg/dL (ref 0.7–1.3)
EGFR: 60 mL/min/{1.73_m2} — ABNORMAL LOW (ref >90)
GLUCOSE: 94 mg/dL (ref 70–140)
Potassium: 4.1 mEq/L (ref 3.5–5.1)
SODIUM: 141 meq/L (ref 136–145)
TOTAL PROTEIN: 8.4 g/dL — AB (ref 6.4–8.3)

## 2016-02-29 MED ORDER — SODIUM CHLORIDE 0.9 % IV SOLN
Freq: Once | INTRAVENOUS | Status: AC
  Administered 2016-02-29: 13:00:00 via INTRAVENOUS

## 2016-02-29 MED ORDER — OXYCODONE HCL 5 MG PO CAPS: 5.0000 mg | ORAL_CAPSULE | ORAL | Status: DC | PRN

## 2016-02-29 MED ORDER — SODIUM CHLORIDE 0.9 % IV SOLN
240.0000 mg | Freq: Once | INTRAVENOUS | Status: AC
  Administered 2016-02-29: 240 mg via INTRAVENOUS
  Filled 2016-02-29: qty 8

## 2016-02-29 NOTE — Patient Instructions (Signed)
[place] [facility] Discharge Instructions for Patients Receiving Chemotherapy  Today you received the following immunotherapy; Opdivo.     BELOW ARE SYMPTOMS THAT SHOULD BE REPORTED IMMEDIATELY:  *FEVER GREATER THAN 100.5 F  *CHILLS WITH OR WITHOUT FEVER  NAUSEA AND VOMITING THAT IS NOT CONTROLLED WITH YOUR NAUSEA MEDICATION  *UNUSUAL SHORTNESS OF BREATH  *UNUSUAL BRUISING OR BLEEDING  TENDERNESS IN MOUTH AND THROAT WITH OR WITHOUT PRESENCE OF ULCERS  *URINARY PROBLEMS  *BOWEL PROBLEMS  UNUSUAL RASH Items with * indicate a potential emergency and should be followed up as soon as possible.  Feel free to call the clinic you have any questions or concerns. The clinic phone number is [phone].  Please show the [place] at check-in to the Emergency Department and triage nurse.

## 2016-02-29 NOTE — Telephone Encounter (Signed)
Gave pt appt & avs °

## 2016-02-29 NOTE — Progress Notes (Signed)
[place] Telephone:(336) [phone]   [phone]  OFFICE PROGRESS NOTE  Cammy Copa, MD [phone] [address]. [address] [place] [address]  DIAGNOSIS: Non-small cell carcinoma of right lung, Adenocarcinoma, stage 3  Staging form: Lung, AJCC 7th Edition  Clinical stage from 04/21/2015: Stage IIIA (T2a, N2, M0) - Signed by Curt Bears, MD on 04/21/2015  Foundation One molecular studies reveal that he was positive for [place], [place], and STAG2 S1060f*20. He was negative for: EGFR, ALK, BRAF, MET, RET, ERBB2 and ROS1  PRIOR THERAPY:  1) Concurrent chemoradiation with chemotherapy in the form of weekly carboplatin for AUC 2 paclitaxel 45 mg/m given concurrent with radiation therapy. First cycle started 05/09/2015. Status post 7 cycles. 2) Consolidation chemotherapy with carboplatin for AUC of 5 on day 1 and gemcitabine 1000 MG/M2 on days 1 and 8 every 3 weeks. First dose 08/15/2015. Discontinued secondary to intolerance.  CURRENT THERAPY: Immunotherapy with Nivolumab 240 MG IV every 2 weeks. First dose 01/18/2016. Status post 3 cycles.  INTERVAL HISTORY: Gabriel PRIEN75y.o. [name] returns to the clinic today for follow-up visit accompanied by his daughter [name] The patient has no complaints today except for the hoarseness of his voice and he is scheduled to see Dr. WErik Obeysoon for evaluation of this condition. He tolerated the second cycle of his immunotherapy with Nivolumab fairly well with no significant adverse effects. He denied having any significant skin rash. He denied having any nausea, vomiting, diarrhea or constipation. He continues to have shortness of breath only with exertion. He denied having any fever or chills. He has no nausea or vomiting. He is here today to start cycle #4 of his immunotherapy.  MEDICAL HISTORY: Past Medical History  Diagnosis Date  . Spontaneous pneumothorax MANY YRS AGO AND AGAIN 04/04/15   HISTORY OF (ONLY)  . [place]     [place]  . Lung mass     right / HAS HAD RADIATION AND CHEMO FOR LUNG CANCER  . Inguinal hernia   . Productive cough     "DUE TO RADIATION"  . History of nonmelanoma skin cancer   . Constipation     AFTER ANESTHESIA  . Difficulty sleeping   . Radiation 05/11/15-06/21/15    NSCLCA/right lung  . Pneumothorax on right 08/24/2015    ALLERGIES:  is allergic to aleve.  MEDICATIONS:  Current Outpatient Prescriptions  Medication Sig Dispense Refill  . acetaminophen (TYLENOL) 325 MG tablet Take 2 tablets (650 mg total) by mouth every 6 (six) hours as needed for mild pain, moderate pain, fever or headache (or Fever >/= 101).    .Marland Kitchenalbuterol (PROVENTIL HFA;VENTOLIN HFA) 108 (90 BASE) MCG/ACT inhaler Inhale 1-2 puffs into the lungs every 6 (six) hours as needed for wheezing or shortness of breath. 1 Inhaler 1  . albuterol (PROVENTIL) (2.5 MG/3ML) 0.083% nebulizer solution Take 3 mLs (2.5 mg total) by nebulization every 6 (six) hours as needed for wheezing or shortness of breath (DX: J44.9). 75 mL 5  . ALPRAZolam (XANAX) 0.5 MG tablet Take 1 tablet (0.5 mg total) by mouth 3 (three) times daily. 90 tablet 0  . Ascorbic Acid (VITAMIN C) 1000 MG tablet Take 1,000 mg by mouth daily.    .Marland Kitchenb complex vitamins tablet Take 1 tablet by mouth daily.    . budesonide-formoterol (SYMBICORT) 160-4.5 MCG/ACT inhaler Inhale 2 puffs into the lungs 2 (two) times daily. 1 Inhaler 2  . CALCIUM CITRATE  PO Take 1 tablet by mouth daily.    . Cyanocobalamin (VITAMIN B 12 PO) Take 1 tablet by mouth daily.    Marland Kitchen guaiFENesin (MUCINEX) 600 MG 12 hr tablet Take 1 tablet (600 mg total) by mouth 2 (two) times daily as needed.    . magic mouthwash SOLN Take 5 mLs by mouth 3 (three) times daily. 1 tsp swish and swallow three times a day 240 mL 3  . magnesium 30 MG tablet Take 30 mg by mouth daily.    . Multiple Vitamin (MULTI VITAMIN DAILY PO) Take 1 tablet by mouth daily.    . Omega-3  Fatty Acids (FISH OIL) 1000 MG CAPS Take 1,000 mg by mouth daily.    Marland Kitchen oxycodone (OXY-IR) 5 MG capsule Take 5 mg by mouth every 4 (four) hours as needed.    . saccharomyces boulardii (FLORASTOR) 250 MG capsule Take 250 mg by mouth 2 (two) times daily.    Marland Kitchen SPIRIVA RESPIMAT 2.5 MCG/ACT AERS     . vitamin A 10000 UNIT capsule Take 10,000 Units by mouth daily.    Marland Kitchen morphine (MS CONTIN) 15 MG 12 hr tablet Take 1 tablet (15 mg total) by mouth every 12 (twelve) hours. (Patient not taking: Reported on 02/29/2016) 60 tablet 0   No current facility-administered medications for this visit.    SURGICAL HISTORY:  Past Surgical History  Procedure Laterality Date  . Hernia repair  2011  . Colonoscopy w/ biopsies and polypectomy    . Wisdom tooth extraction    . Tonsillectomy    . Video bronchoscopy with endobronchial ultrasound N/A 04/18/2015    Procedure: VIDEO BRONCHOSCOPY WITH ENDOBRONCHIAL ULTRASOUND;  Surgeon: Grace Isaac, MD;  Location: [facility];  Service: Thoracic;  Laterality: N/A;  . Inguinal hernia repair Left 07/15/2015    Procedure: OPEN LEFT INGUINAL HERNIA REPAIR;  Surgeon: Johnathan Hausen, MD;  Location: [facility];  Service: General;  Laterality: Left;  With MESH  . Video bronchoscopy with insertion of interbronchial valve (ibv) N/A 08/02/2015    Procedure: VIDEO BRONCHOSCOPY WITH ATTEMPTED INSERTION OF INTERBRONCHIAL VALVE (IBV) WITH FLUROSCOPY;  Surgeon: Grace Isaac, MD;  Location: [facility];  Service: Thoracic;  Laterality: N/A;  . Chest tube insertion Right 08/02/2015    Procedure: INSERTION OF SECOND RIGHT CHEST TUBE WITH FLUROSCOPY;  Surgeon: Grace Isaac, MD;  Location: [facility];  Service: Thoracic;  Laterality: Right;  . Chest tube insertion  07/28/2015    REVIEW OF SYSTEMS:  A comprehensive review of systems was negative except for: Ears, nose, mouth, throat, and face: positive for hoarseness Respiratory: positive for dyspnea on exertion   PHYSICAL EXAMINATION: General  appearance: alert, cooperative and no distress Head: Normocephalic, without obvious abnormality, atraumatic Neck: no adenopathy, no JVD, supple, symmetrical, trachea midline and thyroid not enlarged, symmetric, no tenderness/mass/nodules Lymph nodes: Cervical, supraclavicular, and axillary nodes normal. Resp: clear to auscultation bilaterally Back: symmetric, no curvature. ROM normal. No CVA tenderness. Cardio: regular rate and rhythm, S1, S2 normal, no murmur, click, rub or gallop GI: soft, non-tender; bowel sounds normal; no masses,  no organomegaly Extremities: extremities normal, atraumatic, no cyanosis or edema Neurologic: Alert and oriented X 3, normal strength and tone. Normal symmetric reflexes. Normal coordination and gait  ECOG PERFORMANCE STATUS: 1 - Symptomatic but completely ambulatory  Blood pressure 141/72, pulse 99, temperature 98.2 F (36.8 C), temperature source Oral, resp. rate 18, height 5' 10"  (1.778 m), weight 130 lb 8 oz (59.194 kg), SpO2 100 %.  LABORATORY DATA: Lab Results  Component Value Date   WBC 9.5 02/29/2016   HGB 14.0 02/29/2016   HCT 42.9 02/29/2016   MCV 87.2 02/29/2016   PLT 203 02/29/2016      Chemistry      Component Value Date/Time   NA 143 02/15/2016 1142   NA 134* 09/12/2015 0410   K 3.9 02/15/2016 1142   K 3.8 09/12/2015 0410   CL 102 09/12/2015 0410   CO2 27 02/15/2016 1142   CO2 26 09/12/2015 0410   BUN 31.2* 02/15/2016 1142   BUN 14 09/12/2015 0410   CREATININE 1.2 02/15/2016 1142   CREATININE 0.79 09/13/2015 0457      Component Value Date/Time   CALCIUM 10.0 02/15/2016 1142   CALCIUM 8.0* 09/12/2015 0410   ALKPHOS 107 02/15/2016 1142   ALKPHOS 181* 09/02/2015 0950   AST 22 02/15/2016 1142   AST 28 09/02/2015 0950   ALT 19 02/15/2016 1142   ALT 33 09/02/2015 0950   BILITOT <0.30 02/15/2016 1142   BILITOT 1.2 09/02/2015 0950       RADIOGRAPHIC STUDIES: No results found.  ASSESSMENT AND PLAN: This is a very pleasant  75 years old white [name] with stage IIIA non-small cell lung cancer. He is status post a course of concurrent chemoradiation with weekly carboplatin and paclitaxel with partial response. This was followed by 1 cycle of consolidation chemotherapy with carboplatin and gemcitabine but this was discontinued secondary to right pneumothorax and frequent hospitalization. The patient has been on observation for the last few months. The recent CT scan of the chest showed improvement of the right hemithorax but there was enlarging subsided and new/enlarging soft tissue nodule in the left lung suspicious for metastatic disease. The patient is currently undergoing treatment with immunotherapy with Nivolumab status post 3 cycles. He tolerated his last treatment fairly well. I recommended for him to proceed with cycle #4 today as a scheduled. For pain management, he will continue on his current pain medication. The patient would come back for follow-up visit in 2 weeks for reevaluation with repeat CT scan of the chest, abdomen and pelvis for restaging of his disease before starting cycle #4. The patient was advised to call immediately if he has any concerning symptoms in the interval. The patient and his children agreed to the current plan. He was advised to call immediately if he has any concerning symptoms in the interval. The patient voices understanding of current disease status and treatment options and is in agreement with the current care plan.  All questions were answered. The patient knows to call the clinic with any problems, questions or concerns. We can certainly see the patient much sooner if necessary.  Disclaimer: This note was dictated with voice recognition software. Similar sounding words can inadvertently be transcribed and may not be corrected upon review.

## 2016-02-29 NOTE — Patient Instructions (Signed)
Smoking Cessation, Tips for Success If you are ready to quit smoking, congratulations! You have chosen to help yourself be healthier. Cigarettes bring nicotine, tar, carbon monoxide, and other irritants into your body. Your lungs, heart, and blood vessels will be able to work better without these poisons. There are many different ways to quit smoking. Nicotine gum, nicotine patches, a nicotine inhaler, or nicotine nasal spray can help with physical craving. Hypnosis, support groups, and medicines help break the habit of smoking. WHAT THINGS CAN I DO TO MAKE QUITTING EASIER?  Here are some tips to help you quit for good:  Pick a date when you will quit smoking completely. Tell all of your friends and family about your plan to quit on that date.  Do not try to slowly cut down on the number of cigarettes you are smoking. Pick a quit date and quit smoking completely starting on that day.  Throw away all cigarettes.   Clean and remove all ashtrays from your home, work, and car.  On a card, write down your reasons for quitting. Carry the card with you and read it when you get the urge to smoke.  Cleanse your body of nicotine. Drink enough water and fluids to keep your urine clear or pale yellow. Do this after quitting to flush the nicotine from your body.  Learn to predict your moods. Do not let a bad situation be your excuse to have a cigarette. Some situations in your life might tempt you into wanting a cigarette.  Never have "just one" cigarette. It leads to wanting another and another. Remind yourself of your decision to quit.  Change habits associated with smoking. If you smoked while driving or when feeling stressed, try other activities to replace smoking. Stand up when drinking your coffee. Brush your teeth after eating. Sit in a different chair when you read the paper. Avoid alcohol while trying to quit, and try to drink fewer caffeinated beverages. Alcohol and caffeine may urge you to  smoke.  Avoid foods and drinks that can trigger a desire to smoke, such as sugary or spicy foods and alcohol.  Ask people who smoke not to smoke around you.  Have something planned to do right after eating or having a cup of coffee. For example, plan to take a walk or exercise.  Try a relaxation exercise to calm you down and decrease your stress. Remember, you may be tense and nervous for the first 2 weeks after you quit, but this will pass.  Find new activities to keep your hands busy. Play with a pen, coin, or rubber band. Doodle or draw things on paper.  Brush your teeth right after eating. This will help cut down on the craving for the taste of tobacco after meals. You can also try mouthwash.   Use oral substitutes in place of cigarettes. Try using lemon drops, carrots, cinnamon sticks, or chewing gum. Keep them handy so they are available when you have the urge to smoke.  When you have the urge to smoke, try deep breathing.  Designate your home as a nonsmoking area.  If you are a heavy smoker, ask your health care provider about a prescription for nicotine chewing gum. It can ease your withdrawal from nicotine.  Reward yourself. Set aside the cigarette money you save and buy yourself something nice.  Look for support from others. Join a support group or smoking cessation program. Ask someone at home or at work to help you with your plan   to quit smoking.  Always ask yourself, "Do I need this cigarette or is this just a reflex?" Tell yourself, "Today, I choose not to smoke," or "I do not want to smoke." You are reminding yourself of your decision to quit.  Do not replace cigarette smoking with electronic cigarettes (commonly called e-cigarettes). The safety of e-cigarettes is unknown, and some may contain harmful chemicals.  If you relapse, do not give up! Plan ahead and think about what you will do the next time you get the urge to smoke. HOW WILL I FEEL WHEN I QUIT SMOKING? You  may have symptoms of withdrawal because your body is used to nicotine (the addictive substance in cigarettes). You may crave cigarettes, be irritable, feel very hungry, cough often, get headaches, or have difficulty concentrating. The withdrawal symptoms are only temporary. They are strongest when you first quit but will go away within 10-14 days. When withdrawal symptoms occur, stay in control. Think about your reasons for quitting. Remind yourself that these are signs that your body is healing and getting used to being without cigarettes. Remember that withdrawal symptoms are easier to treat than the major diseases that smoking can cause.  Even after the withdrawal is over, expect periodic urges to smoke. However, these cravings are generally short lived and will go away whether you smoke or not. Do not smoke! WHAT RESOURCES ARE AVAILABLE TO HELP ME QUIT SMOKING? Your health care provider can direct you to community resources or hospitals for support, which may include:  Group support.  Education.  Hypnosis.  Therapy.   This information is not intended to replace advice given to you by your health care provider. Make sure you discuss any questions you have with your health care provider.   Document Released: 07/13/2004 Document Revised: 11/05/2014 Document Reviewed: 04/02/2013 Elsevier Interactive Patient Education 2016 [employer]  

## 2016-03-08 ENCOUNTER — Ambulatory Visit: Payer: 59 | Admitting: Cardiothoracic Surgery

## 2016-03-12 ENCOUNTER — Ambulatory Visit (HOSPITAL_COMMUNITY)
Admission: RE | Admit: 2016-03-12 | Discharge: 2016-03-12 | Disposition: A | Discharge status: Home / Self Care | Payer: 59 | Source: Ambulatory Visit | Attending: Internal Medicine | Admitting: Internal Medicine

## 2016-03-12 DIAGNOSIS — I700 Atherosclerosis of aorta: Secondary | ICD-10-CM | POA: Diagnosis not present

## 2016-03-12 DIAGNOSIS — R490 Dysphonia: Secondary | ICD-10-CM | POA: Diagnosis present

## 2016-03-12 DIAGNOSIS — N289 Disorder of kidney and ureter, unspecified: Secondary | ICD-10-CM | POA: Diagnosis not present

## 2016-03-12 DIAGNOSIS — Z9225 Personal history of immunosupression therapy: Secondary | ICD-10-CM | POA: Diagnosis not present

## 2016-03-12 DIAGNOSIS — J90 Pleural effusion, not elsewhere classified: Secondary | ICD-10-CM | POA: Diagnosis not present

## 2016-03-12 DIAGNOSIS — J439 Emphysema, unspecified: Secondary | ICD-10-CM | POA: Insufficient documentation

## 2016-03-12 DIAGNOSIS — C3491 Malignant neoplasm of unspecified part of right bronchus or lung: Secondary | ICD-10-CM | POA: Insufficient documentation

## 2016-03-12 DIAGNOSIS — Z5112 Encounter for antineoplastic immunotherapy: Secondary | ICD-10-CM

## 2016-03-12 DIAGNOSIS — J479 Bronchiectasis, uncomplicated: Secondary | ICD-10-CM | POA: Insufficient documentation

## 2016-03-12 MED ORDER — IOPAMIDOL (ISOVUE-300) INJECTION 61%
100.0000 mL | Freq: Once | INTRAVENOUS | Status: AC | PRN
  Administered 2016-03-12: 100 mL via INTRAVENOUS

## 2016-03-14 ENCOUNTER — Ambulatory Visit (HOSPITAL_BASED_OUTPATIENT_CLINIC_OR_DEPARTMENT_OTHER): Payer: 59

## 2016-03-14 ENCOUNTER — Ambulatory Visit (HOSPITAL_BASED_OUTPATIENT_CLINIC_OR_DEPARTMENT_OTHER): Payer: 59 | Admitting: Internal Medicine

## 2016-03-14 ENCOUNTER — Encounter: Payer: Self-pay | Admitting: Internal Medicine

## 2016-03-14 ENCOUNTER — Telehealth: Payer: Self-pay | Admitting: Internal Medicine

## 2016-03-14 VITALS — BP 156/78 | HR 102 | Temp 97.7°F | Resp 18 | Ht 70.0 in | Wt 131.6 lb

## 2016-03-14 DIAGNOSIS — Z5112 Encounter for antineoplastic immunotherapy: Secondary | ICD-10-CM

## 2016-03-14 DIAGNOSIS — K219 Gastro-esophageal reflux disease without esophagitis: Secondary | ICD-10-CM

## 2016-03-14 DIAGNOSIS — C3491 Malignant neoplasm of unspecified part of right bronchus or lung: Secondary | ICD-10-CM

## 2016-03-14 DIAGNOSIS — R52 Pain, unspecified: Secondary | ICD-10-CM | POA: Diagnosis not present

## 2016-03-14 DIAGNOSIS — M799 Soft tissue disorder, unspecified: Secondary | ICD-10-CM | POA: Diagnosis not present

## 2016-03-14 DIAGNOSIS — R0609 Other forms of dyspnea: Secondary | ICD-10-CM

## 2016-03-14 LAB — CBC WITH DIFFERENTIAL/PLATELET
BASO%: 1.1 % (ref 0.0–2.0)
BASOS ABS: 0.1 10*3/uL (ref 0.0–0.1)
EOS ABS: 0.3 10*3/uL (ref 0.0–0.5)
EOS%: 3.2 % (ref 0.0–7.0)
HEMATOCRIT: 41.8 % (ref 38.4–49.9)
HEMOGLOBIN: 13.9 g/dL (ref 13.0–17.1)
LYMPH#: 0.8 10*3/uL — AB (ref 0.9–3.3)
LYMPH%: 8.4 % — ABNORMAL LOW (ref 14.0–49.0)
MCH: 29.2 pg (ref 27.2–33.4)
MCHC: 33.2 g/dL (ref 32.0–36.0)
MCV: 88 fL (ref 79.3–98.0)
MONO#: 0.5 10*3/uL (ref 0.1–0.9)
MONO%: 6 % (ref 0.0–14.0)
NEUT#: 7.4 10*3/uL — ABNORMAL HIGH (ref 1.5–6.5)
NEUT%: 81.3 % — AB (ref 39.0–75.0)
PLATELETS: 218 10*3/uL (ref 140–400)
RBC: 4.75 10*6/uL (ref 4.20–5.82)
RDW: 15.3 % — AB (ref 11.0–14.6)
WBC: 9.1 10*3/uL (ref 4.0–10.3)

## 2016-03-14 LAB — COMPREHENSIVE METABOLIC PANEL
ALBUMIN: 3.9 g/dL (ref 3.5–5.0)
ALK PHOS: 100 U/L (ref 40–150)
ALT: 22 U/L (ref 0–55)
ANION GAP: 8 meq/L (ref 3–11)
AST: 21 U/L (ref 5–34)
BUN: 25.8 mg/dL (ref 7.0–26.0)
CALCIUM: 10.1 mg/dL (ref 8.4–10.4)
CHLORIDE: 103 meq/L (ref 98–109)
CO2: 27 mEq/L (ref 22–29)
Creatinine: 1.2 mg/dL (ref 0.7–1.3)
EGFR: 58 mL/min/{1.73_m2} — ABNORMAL LOW (ref >90)
Glucose: 95 mg/dl (ref 70–140)
POTASSIUM: 3.8 meq/L (ref 3.5–5.1)
Sodium: 138 mEq/L (ref 136–145)
Total Bilirubin: 0.33 mg/dL (ref 0.20–1.20)
Total Protein: 8.5 g/dL — ABNORMAL HIGH (ref 6.4–8.3)

## 2016-03-14 MED ORDER — SODIUM CHLORIDE 0.9 % IV SOLN
240.0000 mg | Freq: Once | INTRAVENOUS | Status: AC
  Administered 2016-03-14: 240 mg via INTRAVENOUS
  Filled 2016-03-14: qty 20

## 2016-03-14 MED ORDER — SODIUM CHLORIDE 0.9 % IV SOLN: Freq: Once | INTRAVENOUS | Status: DC

## 2016-03-14 NOTE — Patient Instructions (Signed)
[facility] Discharge Instructions for Patients Receiving Chemotherapy  Today you received the following chemotherapy agents Nivolumab.  To help prevent nausea and vomiting after your treatment, we encourage you to take your nausea medication as prescribed.   If you develop nausea and vomiting that is not controlled by your nausea medication, call the clinic.   BELOW ARE SYMPTOMS THAT SHOULD BE REPORTED IMMEDIATELY:  *FEVER GREATER THAN 100.5 F  *CHILLS WITH OR WITHOUT FEVER  NAUSEA AND VOMITING THAT IS NOT CONTROLLED WITH YOUR NAUSEA MEDICATION  *UNUSUAL SHORTNESS OF BREATH  *UNUSUAL BRUISING OR BLEEDING  TENDERNESS IN MOUTH AND THROAT WITH OR WITHOUT PRESENCE OF ULCERS  *URINARY PROBLEMS  *BOWEL PROBLEMS  UNUSUAL RASH Items with * indicate a potential emergency and should be followed up as soon as possible.  Feel free to call the clinic you have any questions or concerns. The clinic phone number is [phone].  Please show the CHEMO ALERT CARD at check-in to the Emergency Department and triage nurse.   

## 2016-03-14 NOTE — Telephone Encounter (Signed)
Gave pt apt & avs °

## 2016-03-14 NOTE — Progress Notes (Signed)
[name] [phone]   [phone]  OFFICE PROGRESS NOTE  Cammy Copa, MD [phone] [place] [address]  DIAGNOSIS: Non-small cell carcinoma of right lung, Adenocarcinoma, stage 3  Staging form: Lung, AJCC 7th Edition  Clinical stage from 04/21/2015: Stage IIIA (T2a, N2, M0) - Signed by Curt Bears, MD on 04/21/2015  [facility] One molecular studies reveal that he was positive for [place], [place], and STAG2 [id number]. He was negative for: EGFR, ALK, BRAF, MET, RET, ERBB2 and ROS1  PRIOR THERAPY:  1) Concurrent chemoradiation with chemotherapy in the form of weekly carboplatin for AUC 2 paclitaxel 45 mg/m given concurrent with radiation therapy. First cycle started 05/09/2015. Status post 7 cycles. 2) Consolidation chemotherapy with carboplatin for AUC of 5 on day 1 and gemcitabine 1000 MG/M2 on days 1 and 8 every 3 weeks. First dose 08/15/2015. Discontinued secondary to intolerance.  CURRENT THERAPY: Immunotherapy with Nivolumab 240 MG IV every 2 weeks. First dose 01/18/2016. Status post 3 cycles.  INTERVAL HISTORY: Gabriel [id number]. male returns to the clinic today for follow-up visit accompanied by [name] son, Gabriel [name] The patient has no complaints today except for the hoarseness of his voice. He was seen by Dr. WErik Obeysoon for evaluation of this condition and no evidence for focal cord paralysis was seen. He started him on Prilosec for acid reflux. He tolerated the second cycle of his immunotherapy with Nivolumab fairly well with no significant adverse effects. He denied having any significant skin rash. He denied having any nausea, vomiting, diarrhea or constipation. He continues to have shortness of breath only with exertion. He denied having any fever or chills. He has no nausea or vomiting. He had repeat CT scan of the chest, abdomen and pelvis performed recently and he is  here for evaluation and discussion of his scan results.  MEDICAL HISTORY: Past Medical History  Diagnosis Date  . Spontaneous pneumothorax MANY YRS AGO AND AGAIN 04/04/15    HISTORY OF (ONLY)  . Cancer ([place]     skin cancer  . Lung mass     right / HAS HAD RADIATION AND CHEMO FOR LUNG CANCER  . Inguinal hernia   . Productive cough     "DUE TO RADIATION"  . History of nonmelanoma skin cancer   . Constipation     AFTER ANESTHESIA  . Difficulty sleeping   . Radiation 05/11/15-06/21/15    NSCLCA/right lung  . Pneumothorax on right 08/24/2015    ALLERGIES:  is allergic to aleve.  MEDICATIONS:  Current Outpatient Prescriptions  Medication Sig Dispense Refill  . acetaminophen (TYLENOL) 325 MG tablet Take 2 tablets (650 mg total) by mouth every 6 (six) hours as needed for mild pain, moderate pain, fever or headache (or Fever >/= 101).    .Marland Kitchenalbuterol (PROVENTIL HFA;VENTOLIN HFA) 108 (90 BASE) MCG/ACT inhaler Inhale 1-2 puffs into the lungs every 6 (six) hours as needed for wheezing or shortness of breath. 1 Inhaler 1  . albuterol (PROVENTIL) (2.5 MG/3ML) 0.083% nebulizer solution Take 3 mLs (2.5 mg total) by nebulization every 6 (six) hours as needed for wheezing or shortness of breath (DX: J44.9). 75 mL 5  . ALPRAZolam (XANAX) 0.5 MG tablet Take 1 tablet (0.5 mg total) by mouth 3 (three) times daily. 90 tablet 0  . Arginine (RA L-ARGININE) 1000 MG TABS     . Ascorbic Acid (VITAMIN C) 1000 MG tablet Take  1,000 mg by mouth daily.    . B Complex Vitamins (B COMPLEX PO)     . b complex vitamins tablet Take 1 tablet by mouth daily.    . budesonide-formoterol (SYMBICORT) 160-4.5 MCG/ACT inhaler Inhale 2 puffs into the lungs 2 (two) times daily. 1 Inhaler 2  . CALCIUM CITRATE PO Take 1 tablet by mouth daily.    . Cyanocobalamin (VITAMIN B 12 PO) Take 1 tablet by mouth daily.    Marland Kitchen docusate sodium (COLACE) 100 MG capsule     . guaiFENesin (MUCINEX) 600 MG 12 hr tablet Take 1 tablet (600 mg total)  by mouth 2 (two) times daily as needed.    . magnesium 30 MG tablet Take 30 mg by mouth daily.    Marland Kitchen morphine (MS CONTIN) 15 MG 12 hr tablet Take 1 tablet (15 mg total) by mouth every 12 (twelve) hours. 60 tablet 0  . Multiple Vitamin (MULTI VITAMIN DAILY PO) Take 1 tablet by mouth daily.    . Multiple Vitamins-Minerals (MULTIVITAMIN WITH MINERALS) tablet Take by mouth.    . omega-3 acid ethyl esters (LOVAZA) 1 g capsule Take by mouth.    . Omega-3 Fatty Acids (FISH OIL) 1000 MG CAPS Take 1,000 mg by mouth daily.    . Omeprazole (RA OMEPRAZOLE) 20 MG TBEC Take 20 mg by mouth.    Marland Kitchen oxycodone (OXY-IR) 5 MG capsule Take 1 capsule (5 mg total) by mouth every 4 (four) hours as needed. 30 capsule 0  . saccharomyces boulardii (FLORASTOR) 250 MG capsule Take 250 mg by mouth 2 (two) times daily.    Marland Kitchen SPIRIVA RESPIMAT 2.5 MCG/ACT AERS     . vitamin A 10000 UNIT capsule Take 10,000 Units by mouth daily.     No current facility-administered medications for this visit.    SURGICAL HISTORY:  Past Surgical History  Procedure Laterality Date  . Hernia repair  2011  . Colonoscopy w/ biopsies and polypectomy    . Wisdom tooth extraction    . Tonsillectomy    . Video bronchoscopy with endobronchial ultrasound N/A 04/18/2015    Procedure: VIDEO BRONCHOSCOPY WITH ENDOBRONCHIAL ULTRASOUND;  Surgeon: Grace Isaac, MD;  Location: [facility];  Service: Thoracic;  Laterality: N/A;  . Inguinal hernia repair Left 07/15/2015    Procedure: OPEN LEFT INGUINAL HERNIA REPAIR;  Surgeon: Johnathan Hausen, MD;  Location: [facility];  Service: General;  Laterality: Left;  With MESH  . Video bronchoscopy with insertion of interbronchial valve (ibv) N/A 08/02/2015    Procedure: VIDEO BRONCHOSCOPY WITH ATTEMPTED INSERTION OF INTERBRONCHIAL VALVE (IBV) WITH FLUROSCOPY;  Surgeon: Grace Isaac, MD;  Location: [facility];  Service: Thoracic;  Laterality: N/A;  . Chest tube insertion Right 08/02/2015    Procedure: INSERTION OF SECOND RIGHT  CHEST TUBE WITH FLUROSCOPY;  Surgeon: Grace Isaac, MD;  Location: [facility];  Service: Thoracic;  Laterality: Right;  . Chest tube insertion  07/28/2015    REVIEW OF SYSTEMS:  Constitutional: positive for fatigue Eyes: negative Ears, nose, mouth, throat, and face: negative Respiratory: positive for dyspnea on exertion and wheezing Cardiovascular: negative Gastrointestinal: negative Genitourinary:negative Integument/breast: negative Hematologic/lymphatic: negative Musculoskeletal:negative Neurological: negative Behavioral/Psych: negative Endocrine: negative Allergic/Immunologic: negative   PHYSICAL EXAMINATION: General appearance: alert, cooperative and no distress Head: Normocephalic, without obvious abnormality, atraumatic Neck: no adenopathy, no JVD, supple, symmetrical, trachea midline and thyroid not enlarged, symmetric, no tenderness/mass/nodules Lymph nodes: Cervical, supraclavicular, and axillary nodes normal. Resp: clear to auscultation bilaterally Back: symmetric, no curvature.  ROM normal. No CVA tenderness. Cardio: regular rate and rhythm, S1, S2 normal, no murmur, click, rub or gallop GI: soft, non-tender; bowel sounds normal; no masses,  no organomegaly Extremities: extremities normal, atraumatic, no cyanosis or edema Neurologic: Alert and oriented X 3, normal strength and tone. Normal symmetric reflexes. Normal coordination and gait  ECOG PERFORMANCE STATUS: 1 - Symptomatic but completely ambulatory  Blood pressure 156/78, pulse 102, temperature 97.7 F (36.5 C), temperature source Oral, resp. rate 18, height _0  (1.778 m), weight 131 lb 9.6 oz (59.693 kg), SpO2 98 %.  LABORATORY DATA: Lab Results  Component Value Date   WBC 9.1 03/14/2016   HGB 13.9 03/14/2016   HCT 41.8 03/14/2016   MCV 88.0 03/14/2016   PLT 218 03/14/2016      Chemistry      Component Value Date/Time   NA 141 02/29/2016 1137   NA 134* 09/12/2015 0410   K 4.1 02/29/2016 1137   K  3.8 09/12/2015 0410   CL 102 09/12/2015 0410   CO2 26 02/29/2016 1137   CO2 26 09/12/2015 0410   BUN 30.8* 02/29/2016 1137   BUN 14 09/12/2015 0410   CREATININE 1.2 02/29/2016 1137   CREATININE 0.79 09/13/2015 0457      Component Value Date/Time   CALCIUM 10.3 02/29/2016 1137   CALCIUM 8.0* 09/12/2015 0410   ALKPHOS 107 02/29/2016 1137   ALKPHOS 181* 09/02/2015 0950   AST 22 02/29/2016 1137   AST 28 09/02/2015 0950   ALT 23 02/29/2016 1137   ALT 33 09/02/2015 0950   BILITOT <0.30 02/29/2016 1137   BILITOT 1.2 09/02/2015 0950       RADIOGRAPHIC STUDIES: Ct Chest W Contrast  03/12/2016  CLINICAL DATA:  Stage III lung cancer. EXAM: CT CHEST, ABDOMEN, AND PELVIS WITH CONTRAST TECHNIQUE: Multidetector CT imaging of the chest, abdomen and pelvis was performed following the standard protocol during bolus administration of intravenous contrast. CONTRAST:  137m ISOVUE-300 IOPAMIDOL (ISOVUE-300) INJECTION 61% COMPARISON:  Chest CT from 01/03/2016. FINDINGS: CT CHEST FINDINGS Mediastinum/Lymph Nodes: There is no axillary lymphadenopathy. Small right paratracheal lymph nodes are stable as is the small subcarinal lymph node. Abnormal soft tissue attenuation in the right hilum and right infrahilar region, encasing the right lower lobe bronchus is unchanged. The heart size is normal. No pericardial effusion. Coronary artery calcification is noted. The esophagus has normal imaging features. Lungs/Pleura: Volume loss in the right hemi thorax is again noted. Air-fluid level seen in the right apex on the previous study has resolved in the interval. The right upper lobe collapse/consolidation with bronchiectasis is again noted, without substantial change. Continued further decrease in right pleural effusion evident. Centrilobular emphysema noted left upper lobe. Sub solid left lower lobe pulmonary nodule measured previously 2.0 x 2.3 cm now measures 1.9 x 2.2 cm (image 125 series 4). 12 mm adjacent subpleural  left lower lobe nodule seen on the previous study has decreased to 7 mm in the interval (image 128). Cavitary sub solid nodule in the superior segment left lower lobe measured previously 2.9 x 2.5 cm is stable at 2.8 x 2.5 cm (image 73). 7 mm nodule seen more cephalad in the left lower lobe on the prior study measures 6 mm today (image 57 ). Ill-defined sub solid nodule in the posterior left upper lobe measured previously at 12 x 25 mm now measures 16 x 23 mm. 15 mm area of anterior left apical scarring/nodularity on the prior study is now 16 mm (image 16).  Musculoskeletal: Bone windows reveal no worrisome lytic or sclerotic osseous lesions. CT ABDOMEN PELVIS FINDINGS Hepatobiliary: No focal abnormality within the liver parenchyma. There is no evidence for gallstones, gallbladder wall thickening, or pericholecystic fluid. 8 mm common bile duct diameter is upper normal for age. Pancreas: Slight prominence of the main pancreatic duct without pancreatic head lesion. Dystrophic calcification in the tail the pancreas may be from prior inflammation. Spleen: No splenomegaly. No focal mass lesion. Adrenals/Urinary Tract: 1.9 cm low-density lesion in the region the right adrenal gland is unchanged in the interval. Left adrenal gland is normal. No evidence for enhancing lesion in either kidney. There is no hydronephrosis. No evidence for hydroureter. Urinary bladder is normal in appearance. Stomach/Bowel: Stomach is nondistended. No gastric wall thickening. No evidence of outlet obstruction. Duodenum is normally positioned as is the ligament of Treitz. No small bowel wall thickening. No small bowel dilatation. The terminal ileum is normal. Diverticular changes are noted in the left colon without evidence of diverticulitis. Vascular/Lymphatic: There is abdominal aortic atherosclerosis without aneurysm. There is no gastrohepatic or hepatoduodenal ligament lymphadenopathy. No intraperitoneal or retroperitoneal lymphadenopathy.  No pelvic sidewall lymphadenopathy. Reproductive: Prostate gland is enlarged. Other: No intraperitoneal free fluid. Musculoskeletal: Bone windows reveal no worrisome lytic or sclerotic osseous lesions. IMPRESSION: 1. Interval resolution of tiny right apical gas collection with stable collapse/consolidation with bronchiectasis in the right upper lobe. 2. Stable appearance of residual soft tissue fullness around the right lower lobe bronchus. 3. Interval decrease in right pleural effusion. 4. No substantial change in the multiple wall solid and sub solid left lung nodules. 5. Emphysema. 6. Abdominal aortic atherosclerosis. 7. Stable appearance of low-density lesion adjacent to the upper pole the right kidney. Electronically Signed   By: Misty Stanley M.D.   On: 03/12/2016 17:28   Ct Abdomen Pelvis W Contrast  03/12/2016  CLINICAL DATA:  Stage III lung cancer. EXAM: CT CHEST, ABDOMEN, AND PELVIS WITH CONTRAST TECHNIQUE: Multidetector CT imaging of the chest, abdomen and pelvis was performed following the standard protocol during bolus administration of intravenous contrast. CONTRAST:  125m ISOVUE-300 IOPAMIDOL (ISOVUE-300) INJECTION 61% COMPARISON:  Chest CT from 01/03/2016. FINDINGS: CT CHEST FINDINGS Mediastinum/Lymph Nodes: There is no axillary lymphadenopathy. Small right paratracheal lymph nodes are stable as is the small subcarinal lymph node. Abnormal soft tissue attenuation in the right hilum and right infrahilar region, encasing the right lower lobe bronchus is unchanged. The heart size is normal. No pericardial effusion. Coronary artery calcification is noted. The esophagus has normal imaging features. Lungs/Pleura: Volume loss in the right hemi thorax is again noted. Air-fluid level seen in the right apex on the previous study has resolved in the interval. The right upper lobe collapse/consolidation with bronchiectasis is again noted, without substantial change. Continued further decrease in right  pleural effusion evident. Centrilobular emphysema noted left upper lobe. Sub solid left lower lobe pulmonary nodule measured previously 2.0 x 2.3 cm now measures 1.9 x 2.2 cm (image 125 series 4). 12 mm adjacent subpleural left lower lobe nodule seen on the previous study has decreased to 7 mm in the interval (image 128). Cavitary sub solid nodule in the superior segment left lower lobe measured previously 2.9 x 2.5 cm is stable at 2.8 x 2.5 cm (image 73). 7 mm nodule seen more cephalad in the left lower lobe on the prior study measures 6 mm today (image 57 ). Ill-defined sub solid nodule in the posterior left upper lobe measured previously at 12 x 25 mm now  measures 16 x 23 mm. 15 mm area of anterior left apical scarring/nodularity on the prior study is now 16 mm (image 16). Musculoskeletal: Bone windows reveal no worrisome lytic or sclerotic osseous lesions. CT ABDOMEN PELVIS FINDINGS Hepatobiliary: No focal abnormality within the liver parenchyma. There is no evidence for gallstones, gallbladder wall thickening, or pericholecystic fluid. 8 mm common bile duct diameter is upper normal for age. Pancreas: Slight prominence of the main pancreatic duct without pancreatic head lesion. Dystrophic calcification in the tail the pancreas may be from prior inflammation. Spleen: No splenomegaly. No focal mass lesion. Adrenals/Urinary Tract: 1.9 cm low-density lesion in the region the right adrenal gland is unchanged in the interval. Left adrenal gland is normal. No evidence for enhancing lesion in either kidney. There is no hydronephrosis. No evidence for hydroureter. Urinary bladder is normal in appearance. Stomach/Bowel: Stomach is nondistended. No gastric wall thickening. No evidence of outlet obstruction. Duodenum is normally positioned as is the ligament of Treitz. No small bowel wall thickening. No small bowel dilatation. The terminal ileum is normal. Diverticular changes are noted in the left colon without evidence of  diverticulitis. Vascular/Lymphatic: There is abdominal aortic atherosclerosis without aneurysm. There is no gastrohepatic or hepatoduodenal ligament lymphadenopathy. No intraperitoneal or retroperitoneal lymphadenopathy. No pelvic sidewall lymphadenopathy. Reproductive: Prostate gland is enlarged. Other: No intraperitoneal free fluid. Musculoskeletal: Bone windows reveal no worrisome lytic or sclerotic osseous lesions. IMPRESSION: 1. Interval resolution of tiny right apical gas collection with stable collapse/consolidation with bronchiectasis in the right upper lobe. 2. Stable appearance of residual soft tissue fullness around the right lower lobe bronchus. 3. Interval decrease in right pleural effusion. 4. No substantial change in the multiple wall solid and sub solid left lung nodules. 5. Emphysema. 6. Abdominal aortic atherosclerosis. 7. Stable appearance of low-density lesion adjacent to the upper pole the right kidney. Electronically Signed   By: Misty Stanley M.D.   On: 03/12/2016 17:28    ASSESSMENT AND PLAN: This is a very pleasant 76 years old white male with stage IIIA non-small cell lung cancer. He is status post a course of concurrent chemoradiation with weekly carboplatin and paclitaxel with partial response. This was followed by 1 cycle of consolidation chemotherapy with carboplatin and gemcitabine but this was discontinued secondary to right pneumothorax and frequent hospitalization. The patient has been on observation for the last few months. The recent CT scan of the chest showed improvement of the right hemithorax but there was enlarging subsided and new/enlarging soft tissue nodule in the left lung suspicious for metastatic disease. The patient is currently undergoing treatment with immunotherapy with Nivolumab status post 4 cycles. He tolerated his last treatment fairly well. The recent CT scan of the chest, abdomen and pelvis showed no evidence for disease progression. I discussed the  scan results and showed the images to the patient and his family. I recommended for him to proceed with cycle #5 of his immunotherapy with Nivolumab today as a scheduled. For pain management, he will continue on his current pain medication. The patient would come back for follow-up visit in 2 weeks for reevaluation with repeat blood work before starting cycle #5. The patient was advised to call immediately if he has any concerning symptoms in the interval. The patient and his children agreed to the current plan. He was advised to call immediately if he has any concerning symptoms in the interval. The patient voices understanding of current disease status and treatment options and is in agreement with the current care  plan.  All questions were answered. The patient knows to call the clinic with any problems, questions or concerns. We can certainly see the patient much sooner if necessary.  Disclaimer: This note was dictated with voice recognition software. Similar sounding words can inadvertently be transcribed and may not be corrected upon review.

## 2016-03-28 ENCOUNTER — Other Ambulatory Visit (HOSPITAL_BASED_OUTPATIENT_CLINIC_OR_DEPARTMENT_OTHER): Payer: 59

## 2016-03-28 ENCOUNTER — Encounter: Payer: Self-pay | Admitting: Internal Medicine

## 2016-03-28 ENCOUNTER — Ambulatory Visit (HOSPITAL_BASED_OUTPATIENT_CLINIC_OR_DEPARTMENT_OTHER): Payer: 59

## 2016-03-28 ENCOUNTER — Ambulatory Visit (HOSPITAL_BASED_OUTPATIENT_CLINIC_OR_DEPARTMENT_OTHER): Payer: 59 | Admitting: Internal Medicine

## 2016-03-28 VITALS — BP 141/70 | HR 94 | Temp 97.7°F | Resp 18 | Ht 70.0 in | Wt 130.2 lb

## 2016-03-28 DIAGNOSIS — Z5112 Encounter for antineoplastic immunotherapy: Secondary | ICD-10-CM

## 2016-03-28 DIAGNOSIS — C3491 Malignant neoplasm of unspecified part of right bronchus or lung: Secondary | ICD-10-CM

## 2016-03-28 LAB — COMPREHENSIVE METABOLIC PANEL
ALT: 18 U/L (ref 0–55)
ANION GAP: 10 meq/L (ref 3–11)
AST: 20 U/L (ref 5–34)
Albumin: 3.8 g/dL (ref 3.5–5.0)
Alkaline Phosphatase: 87 U/L (ref 40–150)
BUN: 22.4 mg/dL (ref 7.0–26.0)
CALCIUM: 9.8 mg/dL (ref 8.4–10.4)
CHLORIDE: 105 meq/L (ref 98–109)
CO2: 25 mEq/L (ref 22–29)
CREATININE: 1.1 mg/dL (ref 0.7–1.3)
EGFR: 62 mL/min/{1.73_m2} — ABNORMAL LOW (ref >90)
Glucose: 83 mg/dl (ref 70–140)
POTASSIUM: 4 meq/L (ref 3.5–5.1)
Sodium: 140 mEq/L (ref 136–145)
Total Bilirubin: 0.6 mg/dL (ref 0.20–1.20)
Total Protein: 8.2 g/dL (ref 6.4–8.3)

## 2016-03-28 LAB — CBC WITH DIFFERENTIAL/PLATELET
BASO%: 0.9 % (ref 0.0–2.0)
BASOS ABS: 0.1 10*3/uL (ref 0.0–0.1)
EOS%: 4 % (ref 0.0–7.0)
Eosinophils Absolute: 0.4 10*3/uL (ref 0.0–0.5)
HEMATOCRIT: 40.7 % (ref 38.4–49.9)
HGB: 13.6 g/dL (ref 13.0–17.1)
LYMPH#: 0.8 10*3/uL — AB (ref 0.9–3.3)
LYMPH%: 9.5 % — ABNORMAL LOW (ref 14.0–49.0)
MCH: 29.7 pg (ref 27.2–33.4)
MCHC: 33.5 g/dL (ref 32.0–36.0)
MCV: 88.7 fL (ref 79.3–98.0)
MONO#: 0.5 10*3/uL (ref 0.1–0.9)
MONO%: 6.1 % (ref 0.0–14.0)
NEUT#: 6.9 10*3/uL — ABNORMAL HIGH (ref 1.5–6.5)
NEUT%: 79.5 % — AB (ref 39.0–75.0)
PLATELETS: 201 10*3/uL (ref 140–400)
RBC: 4.58 10*6/uL (ref 4.20–5.82)
RDW: 14.9 % — ABNORMAL HIGH (ref 11.0–14.6)
WBC: 8.7 10*3/uL (ref 4.0–10.3)

## 2016-03-28 MED ORDER — SODIUM CHLORIDE 0.9 % IV SOLN
Freq: Once | INTRAVENOUS | Status: AC
  Administered 2016-03-28: 13:00:00 via INTRAVENOUS

## 2016-03-28 MED ORDER — SODIUM CHLORIDE 0.9 % IV SOLN
240.0000 mg | Freq: Once | INTRAVENOUS | Status: AC
  Administered 2016-03-28: 240 mg via INTRAVENOUS
  Filled 2016-03-28: qty 4

## 2016-03-28 NOTE — Progress Notes (Signed)
[place] [phone]   [phone]  OFFICE PROGRESS NOTE  Cammy Copa, MD [phone], [address] [place] [address]  DIAGNOSIS: Non-small cell carcinoma of right lung, Adenocarcinoma, stage 3  Staging form: Lung, AJCC 7th Edition  Clinical stage from 04/21/2015: Stage IIIA (T2a, N2, M0) - Signed by Curt Bears, MD on 04/21/2015  [facility] One molecular studies reveal that he was positive for [place], [place], and STAG2 [id number]. He was negative for: EGFR, ALK, BRAF, MET, RET, ERBB2 and ROS1  PRIOR THERAPY:  1) Concurrent chemoradiation with chemotherapy in the form of weekly carboplatin for AUC 2 paclitaxel 45 mg/m given concurrent with radiation therapy. First cycle started 05/09/2015. Status post 7 cycles. 2) Consolidation chemotherapy with carboplatin for AUC of 5 on day 1 and gemcitabine 1000 MG/M2 on days 1 and 8 every 3 weeks. First dose 08/15/2015. Discontinued secondary to intolerance.  CURRENT THERAPY: Immunotherapy with Nivolumab 240 MG IV every 2 weeks. First dose 01/18/2016. Status post 5 cycles.  INTERVAL HISTORY: Gabriel [id number].[id number]. male returns to the clinic today for follow-up visit accompanied by his daughter [name] The patient has no complaints today except for persistent fatigue. He tolerated the second cycle of his immunotherapy with Nivolumab fairly well with no significant adverse effects. He denied having any significant skin rash. He denied having any nausea, vomiting, diarrhea or constipation. He continues to have shortness of breath only with exertion. He denied having any fever or chills. He has no nausea or vomiting. He is here to start cycle #6 of his treatment.  MEDICAL HISTORY: Past Medical History  Diagnosis Date  . Spontaneous pneumothorax MANY YRS AGO AND AGAIN 04/04/15    HISTORY OF (ONLY)  . [place]  . Lung mass     right / HAS HAD RADIATION AND  CHEMO FOR LUNG CANCER  . Inguinal hernia   . Productive cough     "DUE TO RADIATION"  . History of nonmelanoma skin cancer   . Constipation     AFTER ANESTHESIA  . Difficulty sleeping   . Radiation 05/11/15-06/21/15    NSCLCA/right lung  . Pneumothorax on right 08/24/2015    ALLERGIES:  is allergic to aleve.  MEDICATIONS:  Current Outpatient Prescriptions  Medication Sig Dispense Refill  . albuterol (PROVENTIL) (2.5 MG/3ML) 0.083% nebulizer solution Take 3 mLs (2.5 mg total) by nebulization every 6 (six) hours as needed for wheezing or shortness of breath (DX: J44.9). 75 mL 5  . Arginine (RA L-ARGININE) 1000 MG TABS     . Ascorbic Acid (VITAMIN C) 1000 MG tablet Take 1,000 mg by mouth daily.    . B Complex Vitamins (B COMPLEX PO)     . b complex vitamins tablet Take 1 tablet by mouth daily.    . budesonide-formoterol (SYMBICORT) 160-4.5 MCG/ACT inhaler Inhale 2 puffs into the lungs 2 (two) times daily. 1 Inhaler 2  . CALCIUM CITRATE PO Take 1 tablet by mouth daily.    . Cyanocobalamin (VITAMIN B 12 PO) Take 1 tablet by mouth daily.    .Marland KitchenguaiFENesin (MUCINEX) 600 MG 12 hr tablet Take 1 tablet (600 mg total) by mouth 2 (two) times daily as needed.    . magnesium 30 MG tablet Take 30 mg by mouth daily.    . Multiple Vitamin (MULTI VITAMIN DAILY PO) Take 1 tablet by mouth daily.    . Multiple Vitamins-Minerals (  MULTIVITAMIN ADULT PO)     . Multiple Vitamins-Minerals (MULTIVITAMIN WITH MINERALS) tablet Take by mouth.    . omega-3 acid ethyl esters (LOVAZA) 1 g capsule Take by mouth.    . Omega-3 Fatty Acids (FISH OIL) 1000 MG CAPS Take 1,000 mg by mouth daily.    . Omeprazole (RA OMEPRAZOLE) 20 MG TBEC Take 20 mg by mouth.    . saccharomyces boulardii (FLORASTOR) 250 MG capsule Take 250 mg by mouth 2 (two) times daily.    . vitamin A 10000 UNIT capsule Take 10,000 Units by mouth daily.     No current facility-administered medications for this visit.    SURGICAL HISTORY:  Past  Surgical History  Procedure Laterality Date  . Hernia repair  2011  . Colonoscopy w/ biopsies and polypectomy    . Wisdom tooth extraction    . Tonsillectomy    . Video bronchoscopy with endobronchial ultrasound N/A 04/18/2015    Procedure: VIDEO BRONCHOSCOPY WITH ENDOBRONCHIAL ULTRASOUND;  Surgeon: Grace Isaac, MD;  Location: [facility];  Service: Thoracic;  Laterality: N/A;  . Inguinal hernia repair Left 07/15/2015    Procedure: OPEN LEFT INGUINAL HERNIA REPAIR;  Surgeon: Johnathan Hausen, MD;  Location: [facility];  Service: General;  Laterality: Left;  With MESH  . Video bronchoscopy with insertion of interbronchial valve (ibv) N/A 08/02/2015    Procedure: VIDEO BRONCHOSCOPY WITH ATTEMPTED INSERTION OF INTERBRONCHIAL VALVE (IBV) WITH FLUROSCOPY;  Surgeon: Grace Isaac, MD;  Location: [facility];  Service: Thoracic;  Laterality: N/A;  . Chest tube insertion Right 08/02/2015    Procedure: INSERTION OF SECOND RIGHT CHEST TUBE WITH FLUROSCOPY;  Surgeon: Grace Isaac, MD;  Location: [facility];  Service: Thoracic;  Laterality: Right;  . Chest tube insertion  07/28/2015    REVIEW OF SYSTEMS:  A comprehensive review of systems was negative except for: Constitutional: positive for fatigue   PHYSICAL EXAMINATION: General appearance: alert, cooperative and no distress Head: Normocephalic, without obvious abnormality, atraumatic Neck: no adenopathy, no JVD, supple, symmetrical, trachea midline and thyroid not enlarged, symmetric, no tenderness/mass/nodules Lymph nodes: Cervical, supraclavicular, and axillary nodes normal. Resp: clear to auscultation bilaterally Back: symmetric, no curvature. ROM normal. No CVA tenderness. Cardio: regular rate and rhythm, S1, S2 normal, no murmur, click, rub or gallop GI: soft, non-tender; bowel sounds normal; no masses,  no organomegaly Extremities: extremities normal, atraumatic, no cyanosis or edema Neurologic: Alert and oriented X 3, normal strength and tone. Normal  symmetric reflexes. Normal coordination and gait  ECOG PERFORMANCE STATUS: 1 - Symptomatic but completely ambulatory  Blood pressure 141/70, pulse 94, temperature 97.7 F (36.5 C), temperature source Oral, resp. rate 18, height 5' 10"  (1.778 m), weight 130 lb 3.2 oz (59.058 kg), SpO2 100 %.  LABORATORY DATA: Lab Results  Component Value Date   WBC 8.7 03/28/2016   HGB 13.6 03/28/2016   HCT 40.7 03/28/2016   MCV 88.7 03/28/2016   PLT 201 03/28/2016      Chemistry      Component Value Date/Time   NA 140 03/28/2016 1124   NA 134* 09/12/2015 0410   K 4.0 03/28/2016 1124   K 3.8 09/12/2015 0410   CL 102 09/12/2015 0410   CO2 25 03/28/2016 1124   CO2 26 09/12/2015 0410   BUN 22.4 03/28/2016 1124   BUN 14 09/12/2015 0410   CREATININE 1.1 03/28/2016 1124   CREATININE 0.79 09/13/2015 0457      Component Value Date/Time   CALCIUM 9.8 03/28/2016 1124  CALCIUM 8.0* 09/12/2015 0410   ALKPHOS 87 03/28/2016 1124   ALKPHOS 181* 09/02/2015 0950   AST 20 03/28/2016 1124   AST 28 09/02/2015 0950   ALT 18 03/28/2016 1124   ALT 33 09/02/2015 0950   BILITOT 0.60 03/28/2016 1124   BILITOT 1.2 09/02/2015 0950       RADIOGRAPHIC STUDIES: Ct Chest W Contrast  03/12/2016  CLINICAL DATA:  Stage III lung cancer. EXAM: CT CHEST, ABDOMEN, AND PELVIS WITH CONTRAST TECHNIQUE: Multidetector CT imaging of the chest, abdomen and pelvis was performed following the standard protocol during bolus administration of intravenous contrast. CONTRAST:  169m ISOVUE-300 IOPAMIDOL (ISOVUE-300) INJECTION 61% COMPARISON:  Chest CT from 01/03/2016. FINDINGS: CT CHEST FINDINGS Mediastinum/Lymph Nodes: There is no axillary lymphadenopathy. Small right paratracheal lymph nodes are stable as is the small subcarinal lymph node. Abnormal soft tissue attenuation in the right hilum and right infrahilar region, encasing the right lower lobe bronchus is unchanged. The heart size is normal. No pericardial effusion. Coronary  artery calcification is noted. The esophagus has normal imaging features. Lungs/Pleura: Volume loss in the right hemi thorax is again noted. Air-fluid level seen in the right apex on the previous study has resolved in the interval. The right upper lobe collapse/consolidation with bronchiectasis is again noted, without substantial change. Continued further decrease in right pleural effusion evident. Centrilobular emphysema noted left upper lobe. Sub solid left lower lobe pulmonary nodule measured previously 2.0 x 2.3 cm now measures 1.9 x 2.2 cm (image 125 series 4). 12 mm adjacent subpleural left lower lobe nodule seen on the previous study has decreased to 7 mm in the interval (image 128). Cavitary sub solid nodule in the superior segment left lower lobe measured previously 2.9 x 2.5 cm is stable at 2.8 x 2.5 cm (image 73). 7 mm nodule seen more cephalad in the left lower lobe on the prior study measures 6 mm today (image 57 ). Ill-defined sub solid nodule in the posterior left upper lobe measured previously at 12 x 25 mm now measures 16 x 23 mm. 15 mm area of anterior left apical scarring/nodularity on the prior study is now 16 mm (image 16). Musculoskeletal: Bone windows reveal no worrisome lytic or sclerotic osseous lesions. CT ABDOMEN PELVIS FINDINGS Hepatobiliary: No focal abnormality within the liver parenchyma. There is no evidence for gallstones, gallbladder wall thickening, or pericholecystic fluid. 8 mm common bile duct diameter is upper normal for age. Pancreas: Slight prominence of the main pancreatic duct without pancreatic head lesion. Dystrophic calcification in the tail the pancreas may be from prior inflammation. Spleen: No splenomegaly. No focal mass lesion. Adrenals/Urinary Tract: 1.9 cm low-density lesion in the region the right adrenal gland is unchanged in the interval. Left adrenal gland is normal. No evidence for enhancing lesion in either kidney. There is no hydronephrosis. No evidence for  hydroureter. Urinary bladder is normal in appearance. Stomach/Bowel: Stomach is nondistended. No gastric wall thickening. No evidence of outlet obstruction. Duodenum is normally positioned as is the ligament of Treitz. No small bowel wall thickening. No small bowel dilatation. The terminal ileum is normal. Diverticular changes are noted in the left colon without evidence of diverticulitis. Vascular/Lymphatic: There is abdominal aortic atherosclerosis without aneurysm. There is no gastrohepatic or hepatoduodenal ligament lymphadenopathy. No intraperitoneal or retroperitoneal lymphadenopathy. No pelvic sidewall lymphadenopathy. Reproductive: Prostate gland is enlarged. Other: No intraperitoneal free fluid. Musculoskeletal: Bone windows reveal no worrisome lytic or sclerotic osseous lesions. IMPRESSION: 1. Interval resolution of tiny right apical gas collection  with stable collapse/consolidation with bronchiectasis in the right upper lobe. 2. Stable appearance of residual soft tissue fullness around the right lower lobe bronchus. 3. Interval decrease in right pleural effusion. 4. No substantial change in the multiple wall solid and sub solid left lung nodules. 5. Emphysema. 6. Abdominal aortic atherosclerosis. 7. Stable appearance of low-density lesion adjacent to the upper pole the right kidney. Electronically Signed   By: Misty Stanley M.D.   On: 03/12/2016 17:28   Ct Abdomen Pelvis W Contrast  03/12/2016  CLINICAL DATA:  Stage III lung cancer. EXAM: CT CHEST, ABDOMEN, AND PELVIS WITH CONTRAST TECHNIQUE: Multidetector CT imaging of the chest, abdomen and pelvis was performed following the standard protocol during bolus administration of intravenous contrast. CONTRAST:  143m ISOVUE-300 IOPAMIDOL (ISOVUE-300) INJECTION 61% COMPARISON:  Chest CT from 01/03/2016. FINDINGS: CT CHEST FINDINGS Mediastinum/Lymph Nodes: There is no axillary lymphadenopathy. Small right paratracheal lymph nodes are stable as is the small  subcarinal lymph node. Abnormal soft tissue attenuation in the right hilum and right infrahilar region, encasing the right lower lobe bronchus is unchanged. The heart size is normal. No pericardial effusion. Coronary artery calcification is noted. The esophagus has normal imaging features. Lungs/Pleura: Volume loss in the right hemi thorax is again noted. Air-fluid level seen in the right apex on the previous study has resolved in the interval. The right upper lobe collapse/consolidation with bronchiectasis is again noted, without substantial change. Continued further decrease in right pleural effusion evident. Centrilobular emphysema noted left upper lobe. Sub solid left lower lobe pulmonary nodule measured previously 2.0 x 2.3 cm now measures 1.9 x 2.2 cm (image 125 series 4). 12 mm adjacent subpleural left lower lobe nodule seen on the previous study has decreased to 7 mm in the interval (image 128). Cavitary sub solid nodule in the superior segment left lower lobe measured previously 2.9 x 2.5 cm is stable at 2.8 x 2.5 cm (image 73). 7 mm nodule seen more cephalad in the left lower lobe on the prior study measures 6 mm today (image 57 ). Ill-defined sub solid nodule in the posterior left upper lobe measured previously at 12 x 25 mm now measures 16 x 23 mm. 15 mm area of anterior left apical scarring/nodularity on the prior study is now 16 mm (image 16). Musculoskeletal: Bone windows reveal no worrisome lytic or sclerotic osseous lesions. CT ABDOMEN PELVIS FINDINGS Hepatobiliary: No focal abnormality within the liver parenchyma. There is no evidence for gallstones, gallbladder wall thickening, or pericholecystic fluid. 8 mm common bile duct diameter is upper normal for age. Pancreas: Slight prominence of the main pancreatic duct without pancreatic head lesion. Dystrophic calcification in the tail the pancreas may be from prior inflammation. Spleen: No splenomegaly. No focal mass lesion. Adrenals/Urinary Tract:  1.9 cm low-density lesion in the region the right adrenal gland is unchanged in the interval. Left adrenal gland is normal. No evidence for enhancing lesion in either kidney. There is no hydronephrosis. No evidence for hydroureter. Urinary bladder is normal in appearance. Stomach/Bowel: Stomach is nondistended. No gastric wall thickening. No evidence of outlet obstruction. Duodenum is normally positioned as is the ligament of Treitz. No small bowel wall thickening. No small bowel dilatation. The terminal ileum is normal. Diverticular changes are noted in the left colon without evidence of diverticulitis. Vascular/Lymphatic: There is abdominal aortic atherosclerosis without aneurysm. There is no gastrohepatic or hepatoduodenal ligament lymphadenopathy. No intraperitoneal or retroperitoneal lymphadenopathy. No pelvic sidewall lymphadenopathy. Reproductive: Prostate gland is enlarged. Other: No intraperitoneal  free fluid. Musculoskeletal: Bone windows reveal no worrisome lytic or sclerotic osseous lesions. IMPRESSION: 1. Interval resolution of tiny right apical gas collection with stable collapse/consolidation with bronchiectasis in the right upper lobe. 2. Stable appearance of residual soft tissue fullness around the right lower lobe bronchus. 3. Interval decrease in right pleural effusion. 4. No substantial change in the multiple wall solid and sub solid left lung nodules. 5. Emphysema. 6. Abdominal aortic atherosclerosis. 7. Stable appearance of low-density lesion adjacent to the upper pole the right kidney. Electronically Signed   By: Misty Stanley M.D.   On: 03/12/2016 17:28    ASSESSMENT AND PLAN: This is a very pleasant 75 years old white male with stage IIIA non-small cell lung cancer. He is status post a course of concurrent chemoradiation with weekly carboplatin and paclitaxel with partial response. This was followed by 1 cycle of consolidation chemotherapy with carboplatin and gemcitabine but this was  discontinued secondary to right pneumothorax and frequent hospitalization. The patient has been on observation for the last few months. The recent CT scan of the chest showed improvement of the right hemithorax but there was enlarging subsided and new/enlarging soft tissue nodule in the left lung suspicious for metastatic disease. The patient is currently undergoing treatment with immunotherapy with Nivolumab status post 5 cycles. He tolerated his last treatment fairly well. I recommended for him to proceed with cycle #6 of his immunotherapy with Nivolumab today as a scheduled. For pain management, he will continue on his current pain medication. The patient would come back for follow-up visit in 2 weeks for reevaluation with repeat blood work before starting cycle #7. The patient was advised to call immediately if he has any concerning symptoms in the interval. He was advised to call immediately if he has any concerning symptoms in the interval. The patient voices understanding of current disease status and treatment options and is in agreement with the current care plan.  All questions were answered. The patient knows to call the clinic with any problems, questions or concerns. We can certainly see the patient much sooner if necessary.  Disclaimer: This note was dictated with voice recognition software. Similar sounding words can inadvertently be transcribed and may not be corrected upon review.

## 2016-03-28 NOTE — Patient Instructions (Signed)
Nivolumab injection What is this medicine? NIVOLUMAB (nye VOL ue mab) is a monoclonal antibody. It is used to treat melanoma, lung cancer, kidney cancer, and Hodgkin lymphoma. This medicine may be used for other purposes; ask your health care provider or pharmacist if you have questions. What should I tell my health care provider before I take this medicine? They need to know if you have any of these conditions: -diabetes -immune system problems -kidney disease -liver disease -lung disease -organ transplant -stomach or intestine problems -thyroid disease -an unusual or allergic reaction to nivolumab, other medicines, foods, dyes, or preservatives -pregnant or trying to get pregnant -breast-feeding How should I use this medicine? This medicine is for infusion into a vein. It is given by a health care professional in a hospital or clinic setting. A special MedGuide will be given to you before each treatment. Be sure to read this information carefully each time. Talk to your pediatrician regarding the use of this medicine in children. Special care may be needed. Overdosage: If you think you have taken too much of this medicine contact a poison control center or emergency room at once. NOTE: This medicine is only for you. Do not share this medicine with others. What if I miss a dose? It is important not to miss your dose. Call your doctor or health care professional if you are unable to keep an appointment. What may interact with this medicine? Interactions have not been studied. Give your health care provider a list of all the medicines, herbs, non-prescription drugs, or dietary supplements you use. Also tell them if you smoke, drink alcohol, or use illegal drugs. Some items may interact with your medicine. This list may not describe all possible interactions. Give your health care provider a list of all the medicines, herbs, non-prescription drugs, or dietary supplements you use. Also tell  them if you smoke, drink alcohol, or use illegal drugs. Some items may interact with your medicine. What should I watch for while using this medicine? This drug may make you feel generally unwell. Continue your course of treatment even though you feel ill unless your doctor tells you to stop. You may need blood work done while you are taking this medicine. Do not become pregnant while taking this medicine or for 5 months after stopping it. Women should inform their doctor if they wish to become pregnant or think they might be pregnant. There is a potential for serious side effects to an unborn child. Talk to your health care professional or pharmacist for more information. Do not breast-feed an infant while taking this medicine. What side effects may I notice from receiving this medicine? Side effects that you should report to your doctor or health care professional as soon as possible: -allergic reactions like skin rash, itching or hives, swelling of the face, lips, or tongue -black, tarry stools -blood in the urine -bloody or watery diarrhea -changes in vision -change in sex drive -changes in emotions or moods -chest pain -confusion -cough -decreased appetite -diarrhea -facial flushing -feeling faint or lightheaded -fever, chills -hair loss -hallucination, loss of contact with reality -headache -irritable -joint pain -loss of memory -muscle pain -muscle weakness -seizures -shortness of breath -signs and symptoms of high blood sugar such as dizziness; dry mouth; dry skin; fruity breath; nausea; stomach pain; increased hunger or thirst; increased urination -signs and symptoms of kidney injury like trouble passing urine or change in the amount of urine -signs and symptoms of liver injury like dark yellow or  brown urine; general ill feeling or flu-like symptoms; light-colored stools; loss of appetite; nausea; right upper belly pain; unusually weak or tired; yellowing of the eyes or  skin -stiff neck -swelling of the ankles, feet, hands -weight gain Side effects that usually do not require medical attention (report to your doctor or health care professional if they continue or are bothersome): -bone pain -constipation -tiredness -vomiting This list may not describe all possible side effects. Call your doctor for medical advice about side effects. You may report side effects to FDA at 1-800-FDA-1088. Where should I keep my medicine? This drug is given in a hospital or clinic and will not be stored at home. NOTE: This sheet is a summary. It may not cover all possible information. If you have questions about this medicine, talk to your doctor, pharmacist, or health care provider.    2016, Elsevier/Gold Standard. (2015-03-16 10:03:42)

## 2016-03-30 ENCOUNTER — Telehealth: Payer: Self-pay | Admitting: Medical Oncology

## 2016-03-30 ENCOUNTER — Telehealth: Payer: Self-pay | Admitting: Internal Medicine

## 2016-03-30 NOTE — Telephone Encounter (Signed)
err

## 2016-03-30 NOTE — Telephone Encounter (Signed)
spoke w/ pt confirmed resched apt to 6/19, 6/19 only day that week with MD availability

## 2016-04-02 ENCOUNTER — Encounter: Payer: Self-pay | Admitting: Cardiothoracic Surgery

## 2016-04-02 ENCOUNTER — Ambulatory Visit (INDEPENDENT_AMBULATORY_CARE_PROVIDER_SITE_OTHER): Payer: 59 | Admitting: Cardiothoracic Surgery

## 2016-04-02 VITALS — BP 145/86 | HR 106 | Resp 18 | Ht 70.0 in | Wt 130.0 lb

## 2016-04-02 DIAGNOSIS — C3491 Malignant neoplasm of unspecified part of right bronchus or lung: Secondary | ICD-10-CM

## 2016-04-02 DIAGNOSIS — J95812 Postprocedural air leak: Secondary | ICD-10-CM | POA: Diagnosis not present

## 2016-04-02 NOTE — Progress Notes (Signed)
[address]       [place] [address]             [phone]      Gabriel [name]  Medical Record #[id number] Date of Birth: 1941-08-06  Referring: Aura Dials, MD Primary Care: Cammy Copa, MD  Chief Complaint:   Chest tube placement Non-small cell carcinoma of right lung, stage 3 [facility])   Staging form: Lung, AJCC 7th Edition     Clinical stage from 04/21/2015: Stage IIIA (T2a, N2, M0) - Signed by Curt Bears, MD on 04/21/2015  Foundation One molecular studies reveal that he was positive for [place], [place], and STAG2 [id number]. He was negative for: EGFR, ALK, BRAF, MET, RET, ERBB2 and ROS1  PRIOR THERAPY:  1) Concurrent chemoradiation with chemotherapy in the form of weekly carboplatin for AUC 2 paclitaxel 45 mg/m given concurrent with radiation therapy. First cycle started 05/09/2015. Status post 7 cycles. 2) Consolidation chemotherapy with carboplatin for AUC of 5 on day 1 and gemcitabine 1000 MG/M2 on days 1 and 8 every 3 weeks. First dose 08/15/2015. Discontinued secondary to intolerance.  CURRENT THERAPY: Immunotherapy with Nivolumab 240 MG IV every 2 weeks. First dose 01/18/2016. Status post 5 cycles.   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 Since seen 2 months ago the patient continues to improve. He still complains of fatigue but overall is in much higher functional level than he was 6 months ago. He's had no recurrent fevers or chills. He's attempting to exercise on a daily basis. In fact came to the office on his own today without trouble and is planning a trip to the beach next week.  Past Medical History  Diagnosis Date  . Spontaneous pneumothorax MANY YRS AGO AND AGAIN 04/04/15    HISTORY OF (ONLY)  .  Cancer ([facility]     skin cancer  . Lung mass     right / HAS HAD RADIATION AND CHEMO FOR LUNG CANCER  . Inguinal hernia   . Productive cough     "DUE TO RADIATION"  . History of nonmelanoma skin cancer   . Constipation     AFTER ANESTHESIA  . Difficulty sleeping   . Radiation 05/11/15-06/21/15    NSCLCA/right lung  . Pneumothorax on right 08/24/2015     History  Smoking status  . Current Every Day Smoker -- 0.50 packs/day for 55 years  . Types: Cigarettes  Smokeless tobacco  . Never Used    Comment: 1/2 ppd 4.18.17    History  Alcohol Use  . 0.0 oz/week  . 0 Standard drinks or equivalent per week    Comment: every other night -social     Allergies  Allergen Reactions  . Aleve [Naproxen] Rash    Current Outpatient Prescriptions  Medication Sig Dispense Refill  . Arginine (RA L-ARGININE) 1000 MG TABS     . Ascorbic Acid (VITAMIN C) 1000 MG tablet Take 1,000 mg by mouth daily.    . B Complex Vitamins (B COMPLEX PO)     . budesonide-formoterol (SYMBICORT) 160-4.5 MCG/ACT inhaler Inhale 2 puffs into the lungs 2 (two) times daily. 1 Inhaler 2  . CALCIUM CITRATE PO Take 1 tablet by mouth daily.    . Cyanocobalamin (VITAMIN B 12 PO) Take 1 tablet  by mouth daily.    Marland Kitchen guaiFENesin (MUCINEX) 600 MG 12 hr tablet Take 1 tablet (600 mg total) by mouth 2 (two) times daily as needed.    . magnesium 30 MG tablet Take 30 mg by mouth daily.    . Multiple Vitamin (MULTI VITAMIN DAILY PO) Take 1 tablet by mouth daily.    . Multiple Vitamins-Minerals (MULTIVITAMIN ADULT PO)     . Multiple Vitamins-Minerals (MULTIVITAMIN WITH MINERALS) tablet Take by mouth.    . omega-3 acid ethyl esters (LOVAZA) 1 g capsule Take by mouth.    . Omega-3 Fatty Acids (FISH OIL) 1000 MG CAPS Take 1,000 mg by mouth daily.    . Omeprazole (RA OMEPRAZOLE) 20 MG TBEC Take 20 mg by mouth.    . saccharomyces boulardii (FLORASTOR) 250 MG capsule Take 250 mg by mouth 2 (two) times daily.    . vitamin A 10000 UNIT  capsule Take 10,000 Units by mouth daily.    Marland Kitchen albuterol (PROVENTIL) (2.5 MG/3ML) 0.083% nebulizer solution Take 3 mLs (2.5 mg total) by nebulization every 6 (six) hours as needed for wheezing or shortness of breath (DX: J44.9). (Patient not taking: Reported on 04/02/2016) 75 mL 5  . b complex vitamins tablet Take 1 tablet by mouth daily.     No current facility-administered medications for this visit.       Physical Exam: BP 145/86 mmHg  Pulse 106  Resp 18  Ht 5' 10"  (1.778 m)  Wt 130 lb (58.968 kg)  BMI 18.65 kg/m2  SpO2 98%  General appearance: alert and cooperative Neurologic: intact Heart: regular rate and rhythm, S1, S2 normal, no murmur, click, rub or gallop Lungs: diminished breath sounds Right upper lobe Abdomen: soft, non-tender; bowel sounds normal; no masses,  no organomegaly Extremities: extremities normal, atraumatic, no cyanosis or edema and Homans sign is negative, no sign of DVT Wound: Chest tube site is completely healed There is no cervical or supraclavicular adenopathy  Wt Readings from Last 3 Encounters:  04/02/16 130 lb (58.968 kg)  03/28/16 130 lb 3.2 oz (59.058 kg)  03/14/16 131 lb 9.6 oz (59.693 kg)     Diagnostic Studies & Laboratory data:     Recent Radiology Findings:  Ct Chest W ContrastCt Abdomen Pelvis W Contrast 03/12/2016  CLINICAL DATA:  Stage III lung cancer. EXAM: CT CHEST, ABDOMEN, AND PELVIS WITH CONTRAST TECHNIQUE: Multidetector CT imaging of the chest, abdomen and pelvis was performed following the standard protocol during bolus administration of intravenous contrast. CONTRAST:  160m ISOVUE-300 IOPAMIDOL (ISOVUE-300) INJECTION 61% COMPARISON:  Chest CT from 01/03/2016. FINDINGS: CT CHEST FINDINGS Mediastinum/Lymph Nodes: There is no axillary lymphadenopathy. Small right paratracheal lymph nodes are stable as is the small subcarinal lymph node. Abnormal soft tissue attenuation in the right hilum and right infrahilar region, encasing the  right lower lobe bronchus is unchanged. The heart size is normal. No pericardial effusion. Coronary artery calcification is noted. The esophagus has normal imaging features. Lungs/Pleura: Volume loss in the right hemi thorax is again noted. Air-fluid level seen in the right apex on the previous study has resolved in the interval. The right upper lobe collapse/consolidation with bronchiectasis is again noted, without substantial change. Continued further decrease in right pleural effusion evident. Centrilobular emphysema noted left upper lobe. Sub solid left lower lobe pulmonary nodule measured previously 2.0 x 2.3 cm now measures 1.9 x 2.2 cm (image 125 series 4). 12 mm adjacent subpleural left lower lobe nodule seen on the previous study has decreased  to 7 mm in the interval (image 128). Cavitary sub solid nodule in the superior segment left lower lobe measured previously 2.9 x 2.5 cm is stable at 2.8 x 2.5 cm (image 73). 7 mm nodule seen more cephalad in the left lower lobe on the prior study measures 6 mm today (image 57 ). Ill-defined sub solid nodule in the posterior left upper lobe measured previously at 12 x 25 mm now measures 16 x 23 mm. 15 mm area of anterior left apical scarring/nodularity on the prior study is now 16 mm (image 16). Musculoskeletal: Bone windows reveal no worrisome lytic or sclerotic osseous lesions. CT ABDOMEN PELVIS FINDINGS Hepatobiliary: No focal abnormality within the liver parenchyma. There is no evidence for gallstones, gallbladder wall thickening, or pericholecystic fluid. 8 mm common bile duct diameter is upper normal for age. Pancreas: Slight prominence of the main pancreatic duct without pancreatic head lesion. Dystrophic calcification in the tail the pancreas may be from prior inflammation. Spleen: No splenomegaly. No focal mass lesion. Adrenals/Urinary Tract: 1.9 cm low-density lesion in the region the right adrenal gland is unchanged in the interval. Left adrenal gland is  normal. No evidence for enhancing lesion in either kidney. There is no hydronephrosis. No evidence for hydroureter. Urinary bladder is normal in appearance. Stomach/Bowel: Stomach is nondistended. No gastric wall thickening. No evidence of outlet obstruction. Duodenum is normally positioned as is the ligament of Treitz. No small bowel wall thickening. No small bowel dilatation. The terminal ileum is normal. Diverticular changes are noted in the left colon without evidence of diverticulitis. Vascular/Lymphatic: There is abdominal aortic atherosclerosis without aneurysm. There is no gastrohepatic or hepatoduodenal ligament lymphadenopathy. No intraperitoneal or retroperitoneal lymphadenopathy. No pelvic sidewall lymphadenopathy. Reproductive: Prostate gland is enlarged. Other: No intraperitoneal free fluid. Musculoskeletal: Bone windows reveal no worrisome lytic or sclerotic osseous lesions. IMPRESSION: 1. Interval resolution of tiny right apical gas collection with stable collapse/consolidation with bronchiectasis in the right upper lobe. 2. Stable appearance of residual soft tissue fullness around the right lower lobe bronchus. 3. Interval decrease in right pleural effusion. 4. No substantial change in the multiple wall solid and sub solid left lung nodules. 5. Emphysema. 6. Abdominal aortic atherosclerosis. 7. Stable appearance of low-density lesion adjacent to the upper pole the right kidney. Electronically Signed   By: Misty Stanley M.D.   On: 03/12/2016 17:28   Ct Chest W Contrast  01/03/2016  CLINICAL DATA:  Restaging non-small cell carcinoma of right lung. Diagnosed 6/16. Chemotherapy and radiation therapy 8/16. Cough. Shortness of breath on exertion. EXAM: CT CHEST WITH CONTRAST TECHNIQUE: Multidetector CT imaging of the chest was performed during intravenous contrast administration. CONTRAST:  76m OMNIPAQUE IOHEXOL 300 MG/ML  SOLN COMPARISON:  Plain film 11/03/2015.  Most recent CT of 09/30/2015.  FINDINGS: Mediastinum/Nodes: No supraclavicular adenopathy. Mild left-sided gynecomastia. Normal heart size with lipomatous hypertrophy of the interatrial septum. Multivessel coronary artery atherosclerosis. No central pulmonary embolism, on this non-dedicated study. No well-defined mediastinal adenopathy. Lungs/Pleura: Right-sided pleural fluid is decreased, tiny. Residual right-sided pleural thickening. Decreased in right apical pneumothorax. Probable secretions in the right mainstem bronchus. Moderate centrilobular emphysema. Persistent right apical and upper lobe dense consolidation with improvement in lateral areas of bronchiectasis. Improvement in right lung base aeration and septal thickening. Residual soft tissue thickening about the origin of the right upper lobe bronchus, which may represent residual neoplasia. Example image 32/ series 5. Sub solid left lower lobe pulmonary nodule measures 2.0 x 2.3 cm on image 53/series 5. Compare  1.6 x 1.5 cm on the prior exam. Development of a pleural-based adjacent left lower lobe pulmonary nodule which measures 12 mm on image 54/series 5. Is also present on coronal image 31. Increased definition of the superior segment left lower lobe partially cavitary sub solid pulmonary nodule. Example 2.9 x 2.5 cm on image 32/series 5. Compare 3.1 x 2.4 cm on the prior. Soft tissue nodule more cephalad in the left lower lobe measures 7 mm on image 25/ series 5 and is new. Posterior left upper lobe sub solid pulmonary nodule measures 2.5 x 1.2 cm on image 17/series 5 and is similar. Scarring versus sub solid pulmonary nodule on the anterior left apex at 1.5 cm on image 8/series 5. 1.3 cm on the prior. Upper abdomen: Old granulomatous disease in the liver. Normal imaged portions of the spleen, stomach, left adrenal gland, and left kidney. Upper pole right renal collecting system calculus. Right suprarenal nodule measures 1.9 cm on image 60/series 2 and is felt to be similar to on  the prior exam. This is present back to 04/12/2015 PET, and not hypermetabolic on that study. Musculoskeletal: Healing fracture of the fourth antrolateral right rib on image 33/ series 2. New since the prior. No underlying osseous metastasis on the prior exam. IMPRESSION: 1. Overall improved appearance of the right hemi thorax. Although there is residual consolidation and bronchiectasis in the right apex and upper lobe, the right lower lobe demonstrates improved aeration with decreased adjacent pleural fluid and thickening. 2. Residual soft tissue fullness about the origin of the right lower lobe bronchus, suspicious for residual neoplasia. 3. Enlarging sub solid and new/enlarging soft tissue nodules in the left lung, suspicious for metastatic disease. 4.  Atherosclerosis, including within the coronary arteries. 5. 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 6. 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 7. New right fourth rib healing fracture. Favored to be posttraumatic. Correlate with interval probable. Electronically Signed   By: Abigail Miyamoto M.D.   On: 01/03/2016 15:03    Ct Chest Wo Contrast  09/30/2015  CLINICAL DATA:  75 year old male with history of pneumothorax. Followup after chest tube removal. 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 EXAM: CT CHEST WITHOUT CONTRAST TECHNIQUE: Multidetector CT imaging of the chest was performed following the standard protocol without IV contrast. COMPARISON:  Chest CT 08/30/2015. FINDINGS: Mediastinum/Lymph Nodes: Heart size is normal. Small amount of pericardial fluid and/or thickening, unlikely to be of any hemodynamic significance at this time. No associated pericardial calcification. There is atherosclerosis of the thoracic aorta, the great vessels of the mediastinum and the coronary arteries, including calcified atherosclerotic plaque  in the left main, left anterior descending, left circumflex and right coronary arteries. Calcifications of the aortic valve. Multiple borderline enlarged and mildly enlarged mediastinal lymph nodes are again noted, measuring up to 1.2 cm in short axis in the right paratracheal nodal station. There is also soft tissue fullness in the right hilar region, suggesting lymphadenopathy (poorly evaluated on today's noncontrast CT examination). Esophagus is unremarkable in appearance. No axillary lymphadenopathy. Lungs/Pleura: When compared to remote prior examination 07/22/2015, there is evidence of severe chronic volume loss in the right hemithorax, which is notably smaller than the contralateral side. There is chronic collapse and consolidation of much of the right lung, most severe in the right upper lobe, where there also appears to be extensive varicose and cystic bronchiectasis. Cavity  in the apex of the right lung appears to communicate with a thick walled cavity in the apex of the right pleural space, best appreciated on image 13 of series 3. This thick-walled cavity in the right pleural space appears contiguous with a tract along the anterior aspect of the right pleural space, extending into the lower right hemithorax, which likely represents the old chest tube tract. Other than this small amount of pleural gas, there is no significant residual pneumothorax. Small chronic thick-walled right pleural effusion posteriorly. Previously demonstrated right middle and lower lobe lesions are now obscured, but as mentioned above, there is extensive soft tissue fullness in the right hilar region, and in the infrahilar aspect of the right lower lobe, which suggests residual neoplasm and/or adenopathy. Again noted is a large mass-like area of ground-glass attenuation, septal thickening and cystic change in the posterior aspect of the superior segment of the left lower lobe (image 30 of series 3), which appears larger than prior  examinations, currently measuring 3.1 x 2.4 cm. There is also a irregular ground-glass attenuation lesion with septal thickening in the periphery of the left upper lobe (image 16 of series 3) which measures 2.4 x 1.2 cm, and an irregular sub solid lesion with spiculated margins in the inferior aspect of the left lower lobe (image 52 of series 3) which measures 15 x 16 mm. Upper Abdomen: 3 mm calcification in the interpolar region of the right kidney may be vascular, or may be a nonobstructive calculus. Musculoskeletal/Soft Tissues: There are no aggressive appearing lytic or blastic lesions noted in the visualized portions of the skeleton. IMPRESSION: 1. Right-sided fibrothorax. 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 2. Worsening chronic consolidation and collapse in the right lung, with extensive nodular septal thickening, and persistent right infrahilar fullness in the central aspect of the right lower lobe, right hilar fullness and mediastinal adenopathy, as above, suggesting residual neoplasm with right hilar and mediastinal lymphadenopathy. 3. Enlarging sub solid lesions throughout the left lung, as above, concerning for multicentric adenocarcinoma. 4. Moderate centrilobular and paraseptal emphysema. 5. Possible 3 mm nonobstructive calculus in the interpolar collecting system of the right kidney. 6. Atherosclerosis, including left main and 3 vessel coronary artery disease. Electronically Signed   By: Vinnie Langton M.D.   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 CT scan suggests at least stable disease on current therapy if not some overall improvement.  I plan to see him back in 3 months, he's being seen at the cancer center every other week   [name]      [address] [place] [address] Office [phone]   Beeper [phone]  04/02/2016 2:09 PM

## 2016-04-03 ENCOUNTER — Ambulatory Visit: Payer: 59 | Admitting: Cardiothoracic Surgery

## 2016-04-04 NOTE — Progress Notes (Unsigned)
Chart opened in error

## 2016-04-05 ENCOUNTER — Ambulatory Visit: Payer: 59 | Admitting: Cardiothoracic Surgery

## 2016-04-08 IMAGING — MR MR HEAD WO/W CM
9 of 13 series · 35 of 48 positions shown · IV contrast (Yes)
Comparison: PET scan 04/12/2015.

CLINICAL DATA: Lung cancer.  Staging.

EXAM:
MRI HEAD WITHOUT AND WITH CONTRAST
TECHNIQUE: Multiplanar, multiecho pulse sequences of the brain and surrounding
structures were obtained without and with intravenous contrast.
CONTRAST:  13mL MULTIHANCE GADOBENATE DIMEGLUMINE 529 MG/ML IV SOLN

[Series 4: DWI · axial · 3.0mm · 1.09mm/px · z∈[-28,+117]mm · 9 of 100 slices shown (1 of 4)]
[im 1/100]
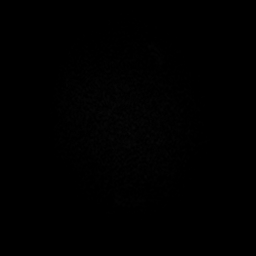
[im 13/100]
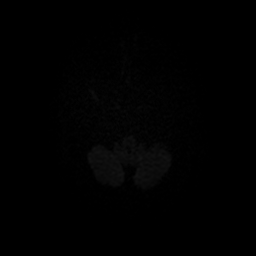
[im 25/100]
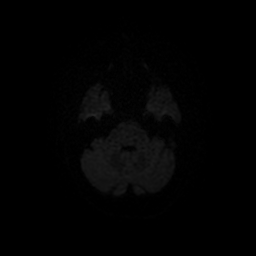
[im 38/100]
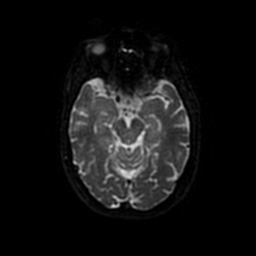
[im 50/100]
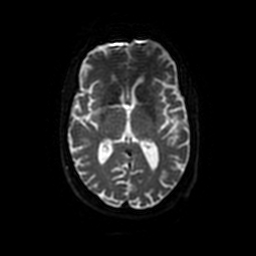
[im 62/100]
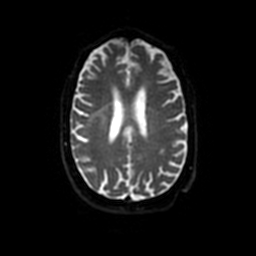
[im 75/100]
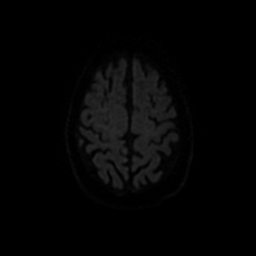
[im 87/100]
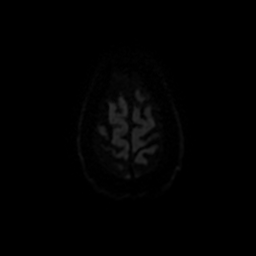
[im 100/100]
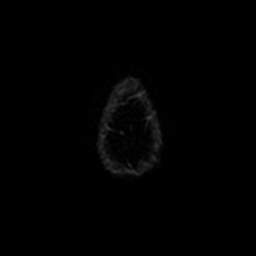

[Series 5: DWI · coronal · 5.0mm · 1.09mm/px · 7 of 80 slices shown (2 of 4)]
[im 1/80]
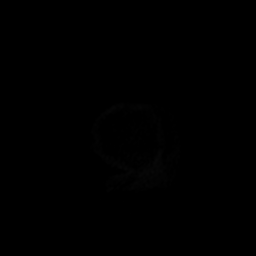
[im 14/80]
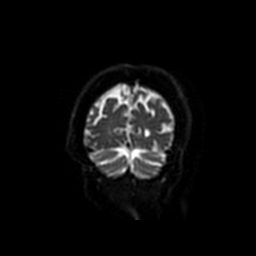
[im 27/80]
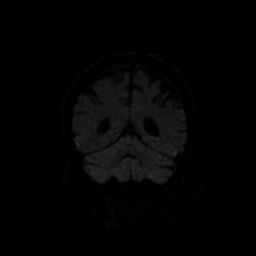
[im 40/80]
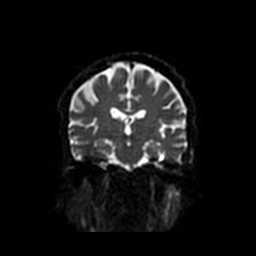
[im 53/80]
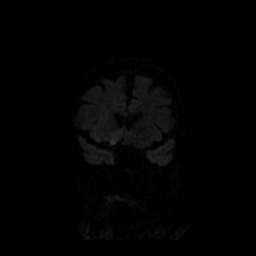
[im 66/80]
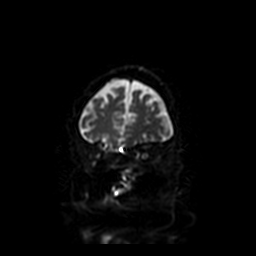
[im 80/80]
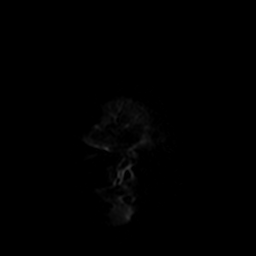

[Series 6: T2 · axial · 5.0mm · 0.43mm/px · z∈[-42,+107]mm · 2 of 24 slices shown]
[im 1/24]
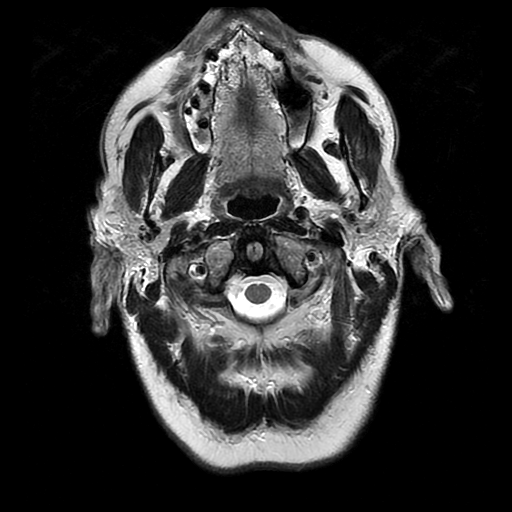
[im 24/24]
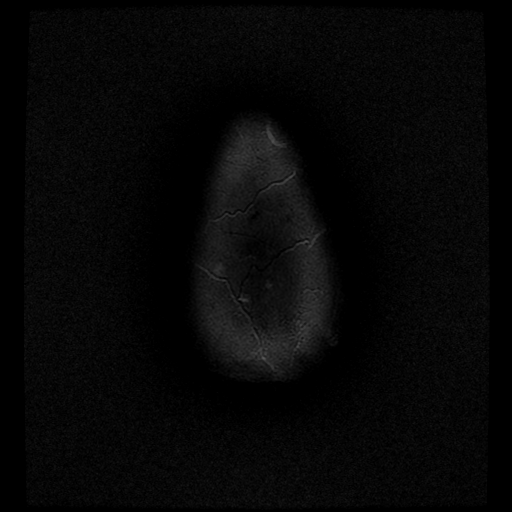

[Series 7: FLAIR · axial · 5.0mm · 0.43mm/px · z∈[-48,+113]mm · 2 of 24 slices shown]
[im 1/24]
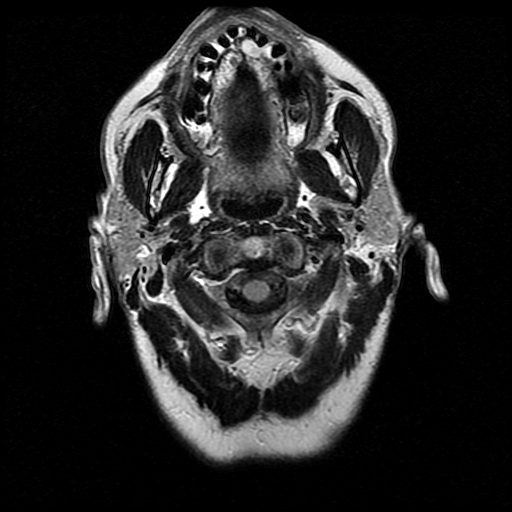
[im 24/24]
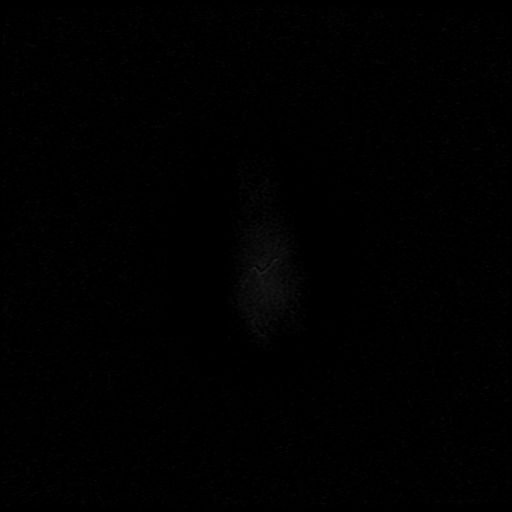

[Series 10: T2 post-contrast · coronal · 5.0mm · 0.45mm/px · 3 of 31 slices shown]
[im 1/31]
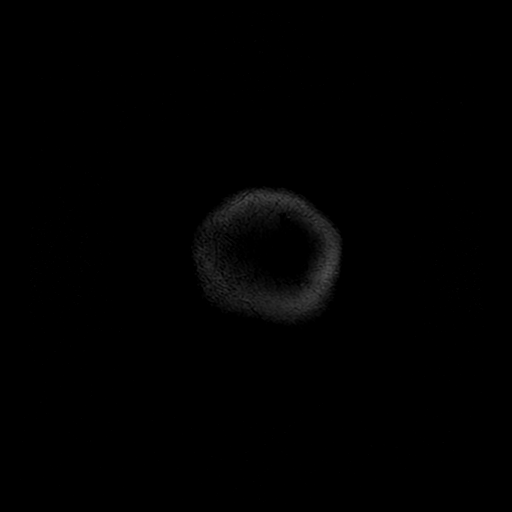
[im 16/31]
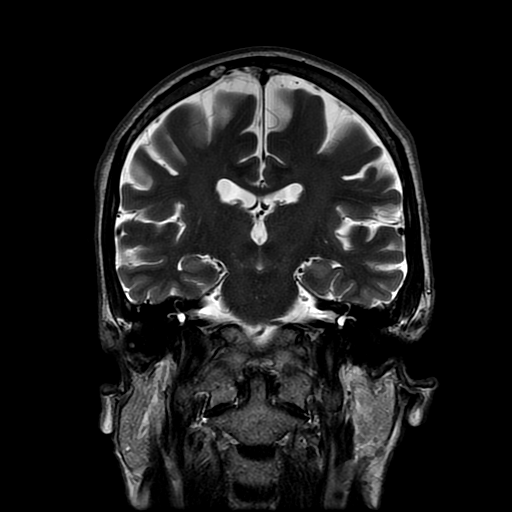
[im 31/31]
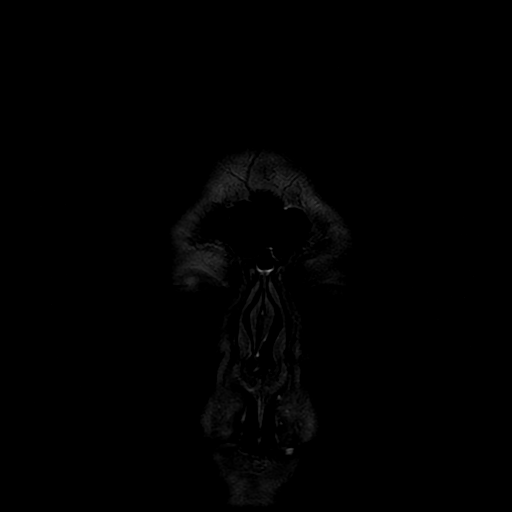

[Series 12: T1 post-contrast · coronal · 5.0mm · 0.45mm/px · 3 of 31 slices shown (1 of 2)]
[im 1/31]
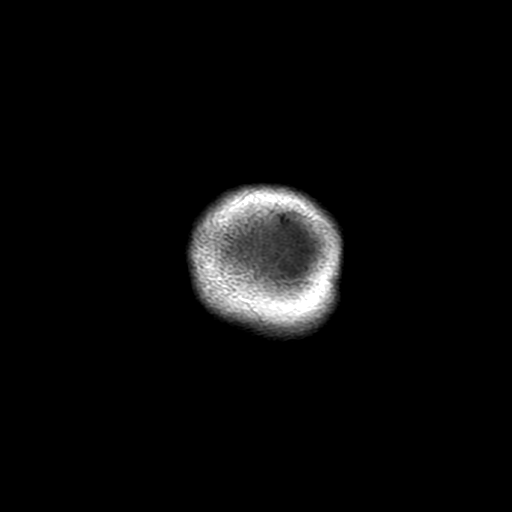
[im 16/31]
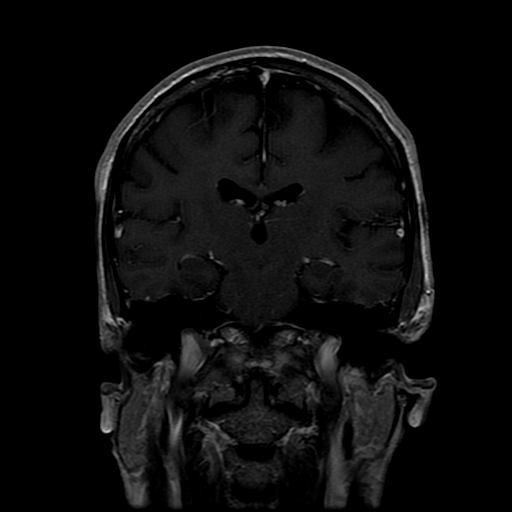
[im 31/31]
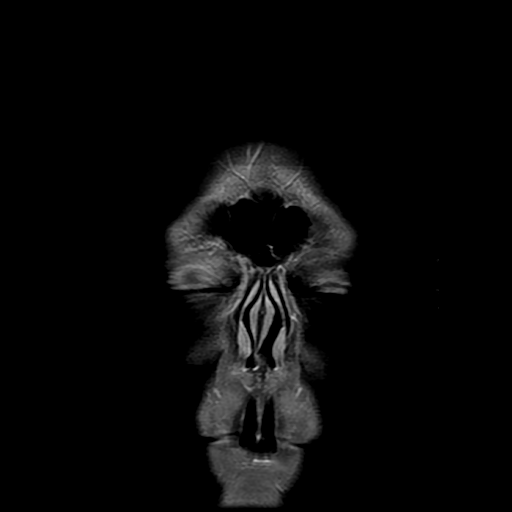

[Series 13: T1 post-contrast · sagittal · 5.0mm · 0.47mm/px · 2 of 24 slices shown (2 of 2)]
[im 1/24]
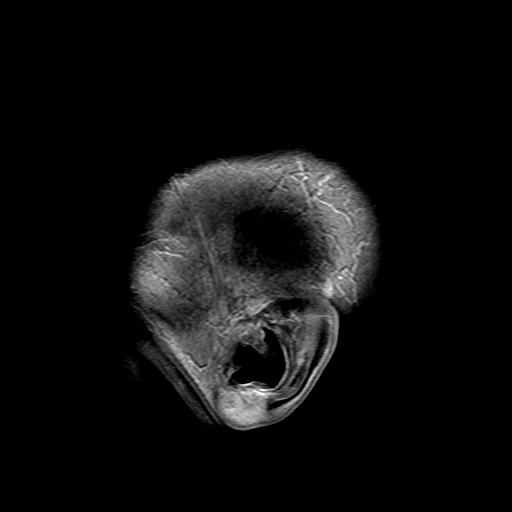
[im 24/24]
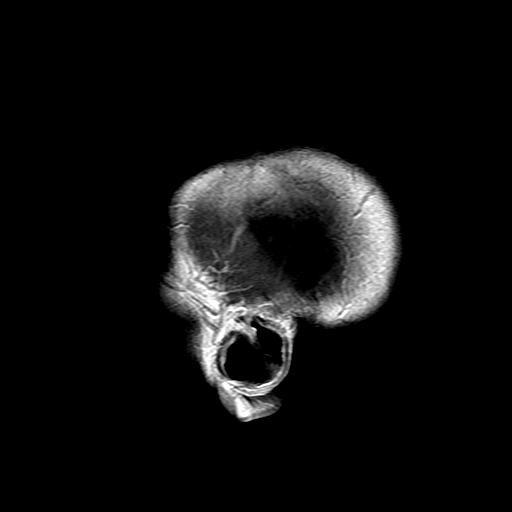

[Series 400: DWI · axial · 3.0mm · 1.09mm/px · z∈[-28,+117]mm · 4 of 50 slices shown (3 of 4)]
[im 1/50]
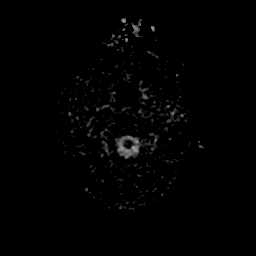
[im 17/50]
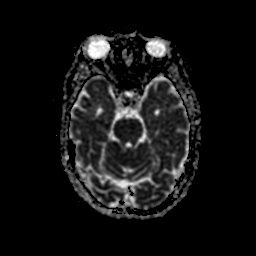
[im 33/50]
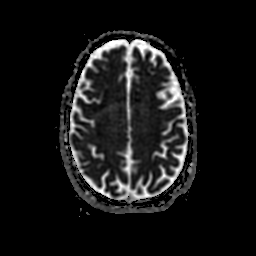
[im 50/50]
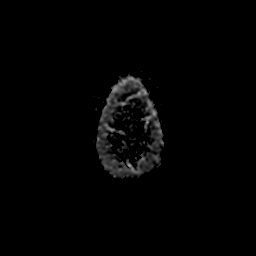

[Series 500: DWI · coronal · 5.0mm · 1.09mm/px · 3 of 40 slices shown (4 of 4)]
[im 1/40]
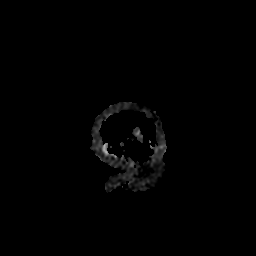
[im 20/40]
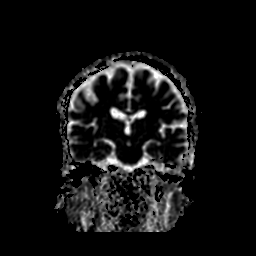
[im 40/40]
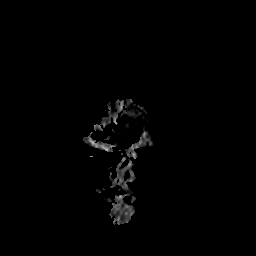

[35 of 48 positions shown; findings below may reference images not displayed]

FINDINGS: No evidence for acute infarction, hemorrhage, mass lesion,
hydrocephalus, or extra-axial fluid. Moderate atrophy not unexpected
for age. Mild subcortical and periventricular T2 and FLAIR
hyperintensities, likely chronic microvascular ischemic change. Flow
voids are maintained throughout the carotid, basilar, and vertebral
arteries. There are no areas of chronic hemorrhage. Pituitary,
pineal, and cerebellar tonsils unremarkable.

Focal area of loss of the normal marrow signal in the clivus seen
best on image 13 series [DATE] x 6 mm, does show postcontrast
enhancement. Skull base metastasis could have this appearance. This
area is incompletely evaluated on recent PET scan but not clearly
abnormal on that study. Continued short-term surveillance warranted.
No upper cervical or calvarial osseous metastases elsewhere.

Post infusion, no abnormal enhancement of the brain or meninges.
Incidental venous angioma LEFT frontal white matter. Major dural
venous sinuses are patent. Extracranial soft tissues are
unremarkable.
IMPRESSION: Moderate atrophy with mild small vessel disease.

No intracranial metastatic disease is evident.

Focal area of marrow heterogeneity within the clivus, subcentimeter
size, is indeterminate but short-term follow-up recommended as
osseous metastatic disease could have this appearance.

## 2016-04-11 ENCOUNTER — Other Ambulatory Visit: Payer: 59

## 2016-04-11 ENCOUNTER — Ambulatory Visit: Payer: 59

## 2016-04-11 ENCOUNTER — Ambulatory Visit: Payer: 59 | Admitting: Internal Medicine

## 2016-04-16 ENCOUNTER — Ambulatory Visit (HOSPITAL_BASED_OUTPATIENT_CLINIC_OR_DEPARTMENT_OTHER): Payer: 59 | Admitting: Internal Medicine

## 2016-04-16 ENCOUNTER — Telehealth: Payer: Self-pay | Admitting: Internal Medicine

## 2016-04-16 ENCOUNTER — Other Ambulatory Visit (HOSPITAL_BASED_OUTPATIENT_CLINIC_OR_DEPARTMENT_OTHER): Payer: 59

## 2016-04-16 ENCOUNTER — Encounter: Payer: Self-pay | Admitting: Internal Medicine

## 2016-04-16 ENCOUNTER — Ambulatory Visit (HOSPITAL_BASED_OUTPATIENT_CLINIC_OR_DEPARTMENT_OTHER): Payer: 59

## 2016-04-16 VITALS — BP 143/73 | HR 90 | Temp 98.0°F | Resp 18 | Ht 70.0 in | Wt 130.8 lb

## 2016-04-16 DIAGNOSIS — Z5112 Encounter for antineoplastic immunotherapy: Secondary | ICD-10-CM

## 2016-04-16 DIAGNOSIS — Z79899 Other long term (current) drug therapy: Secondary | ICD-10-CM

## 2016-04-16 DIAGNOSIS — R0609 Other forms of dyspnea: Secondary | ICD-10-CM

## 2016-04-16 DIAGNOSIS — M799 Soft tissue disorder, unspecified: Secondary | ICD-10-CM

## 2016-04-16 DIAGNOSIS — C3491 Malignant neoplasm of unspecified part of right bronchus or lung: Secondary | ICD-10-CM | POA: Diagnosis not present

## 2016-04-16 DIAGNOSIS — D649 Anemia, unspecified: Secondary | ICD-10-CM | POA: Diagnosis not present

## 2016-04-16 DIAGNOSIS — R52 Pain, unspecified: Secondary | ICD-10-CM

## 2016-04-16 LAB — CBC WITH DIFFERENTIAL/PLATELET
BASO%: 0.3 % (ref 0.0–2.0)
Basophils Absolute: 0 10*3/uL (ref 0.0–0.1)
EOS ABS: 0.3 10*3/uL (ref 0.0–0.5)
EOS%: 3.5 % (ref 0.0–7.0)
HEMATOCRIT: 38.3 % — AB (ref 38.4–49.9)
HGB: 12.8 g/dL — ABNORMAL LOW (ref 13.0–17.1)
LYMPH#: 1 10*3/uL (ref 0.9–3.3)
LYMPH%: 10.7 % — AB (ref 14.0–49.0)
MCH: 30.4 pg (ref 27.2–33.4)
MCHC: 33.4 g/dL (ref 32.0–36.0)
MCV: 91 fL (ref 79.3–98.0)
MONO#: 0.6 10*3/uL (ref 0.1–0.9)
MONO%: 6.9 % (ref 0.0–14.0)
NEUT#: 7.1 10*3/uL — ABNORMAL HIGH (ref 1.5–6.5)
NEUT%: 78.6 % — AB (ref 39.0–75.0)
PLATELETS: 209 10*3/uL (ref 140–400)
RBC: 4.21 10*6/uL (ref 4.20–5.82)
RDW: 15.5 % — ABNORMAL HIGH (ref 11.0–14.6)
WBC: 9 10*3/uL (ref 4.0–10.3)

## 2016-04-16 LAB — COMPREHENSIVE METABOLIC PANEL
ALT: 21 U/L (ref 0–55)
ANION GAP: 9 meq/L (ref 3–11)
AST: 19 U/L (ref 5–34)
Albumin: 3.5 g/dL (ref 3.5–5.0)
Alkaline Phosphatase: 94 U/L (ref 40–150)
BILIRUBIN TOTAL: 0.34 mg/dL (ref 0.20–1.20)
BUN: 24 mg/dL (ref 7.0–26.0)
CALCIUM: 10 mg/dL (ref 8.4–10.4)
CHLORIDE: 105 meq/L (ref 98–109)
CO2: 27 meq/L (ref 22–29)
CREATININE: 1.1 mg/dL (ref 0.7–1.3)
EGFR: 66 mL/min/{1.73_m2} — AB (ref >90)
Glucose: 107 mg/dl (ref 70–140)
Potassium: 4.6 mEq/L (ref 3.5–5.1)
Sodium: 141 mEq/L (ref 136–145)
TOTAL PROTEIN: 7.9 g/dL (ref 6.4–8.3)

## 2016-04-16 LAB — TSH: TSH: 0.75 m[IU]/L (ref 0.320–4.118)

## 2016-04-16 MED ORDER — SODIUM CHLORIDE 0.9 % IV SOLN
Freq: Once | INTRAVENOUS | Status: AC
  Administered 2016-04-16: 14:00:00 via INTRAVENOUS

## 2016-04-16 MED ORDER — SODIUM CHLORIDE 0.9 % IV SOLN
240.0000 mg | Freq: Once | INTRAVENOUS | Status: AC
  Administered 2016-04-16: 240 mg via INTRAVENOUS
  Filled 2016-04-16: qty 8

## 2016-04-16 NOTE — Progress Notes (Signed)
[place] [phone]   [phone]  OFFICE PROGRESS NOTE  Cammy Copa, MD [phone] [address]. [address] [place] [address]  DIAGNOSIS: Non-small cell carcinoma of right lung, Adenocarcinoma, stage 3  Staging form: Lung, AJCC 7th Edition  Clinical stage from 04/21/2015: Stage IIIA (T2a, N2, M0) - Signed by Curt Bears, MD on 04/21/2015  Foundation One molecular studies reveal that he was positive for [place], [place], and STAG2 S1055f*20. He was negative for: EGFR, ALK, BRAF, MET, RET, ERBB2 and ROS1  PRIOR THERAPY:  1) Concurrent chemoradiation with chemotherapy in the form of weekly carboplatin for AUC 2 paclitaxel 45 mg/m given concurrent with radiation therapy. First cycle started 05/09/2015. Status post 7 cycles. 2) Consolidation chemotherapy with carboplatin for AUC of 5 on day 1 and gemcitabine 1000 MG/M2 on days 1 and 8 every 3 weeks. First dose 08/15/2015. Discontinued secondary to intolerance.  CURRENT THERAPY: Immunotherapy with Nivolumab 240 MG IV every 2 weeks. First dose 01/18/2016. Status post 6 cycles.  INTERVAL HISTORY: Gabriel [name] [id number].o. male returns to the clinic today for follow-up visit accompanied by [name] Signs The patient has no complaints today except for persistent fatigue. He [place] the beach. He tolerated the last cycle of his immunotherapy with Nivolumab fairly well with no significant adverse effects. He denied having any significant skin rash. He denied having any nausea, vomiting, diarrhea or constipation. He continues to have shortness of breath only with exertion. He denied having any fever or chills. He has no nausea or vomiting. He is here to start cycle #7 of his treatment.  MEDICAL HISTORY: Past Medical History  Diagnosis Date  . Spontaneous pneumothorax MANY YRS AGO AND AGAIN 04/04/15    HISTORY OF (ONLY)  . Cancer ([place]     skin cancer  . Lung mass       right / HAS HAD RADIATION AND CHEMO FOR LUNG CANCER  . Inguinal hernia   . Productive cough     "DUE TO RADIATION"  . History of nonmelanoma skin cancer   . Constipation     AFTER ANESTHESIA  . Difficulty sleeping   . Radiation 05/11/15-06/21/15    NSCLCA/right lung  . Pneumothorax on right 08/24/2015    ALLERGIES:  is allergic to aleve.  MEDICATIONS:  Current Outpatient Prescriptions  Medication Sig Dispense Refill  . albuterol (PROVENTIL) (2.5 MG/3ML) 0.083% nebulizer solution Take 3 mLs (2.5 mg total) by nebulization every 6 (six) hours as needed for wheezing or shortness of breath (DX: J44.9). (Patient not taking: Reported on 04/02/2016) 75 mL 5  . Arginine (RA L-ARGININE) 1000 MG TABS     . Ascorbic Acid (VITAMIN C) 1000 MG tablet Take 1,000 mg by mouth daily.    . B Complex Vitamins (B COMPLEX PO)     . b complex vitamins tablet Take 1 tablet by mouth daily.    . budesonide-formoterol (SYMBICORT) 160-4.5 MCG/ACT inhaler Inhale 2 puffs into the lungs 2 (two) times daily. 1 Inhaler 2  . CALCIUM CITRATE PO Take 1 tablet by mouth daily.    . Cyanocobalamin (VITAMIN B 12 PO) Take 1 tablet by mouth daily.    .Marland KitchenguaiFENesin (MUCINEX) 600 MG 12 hr tablet Take 1 tablet (600 mg total) by mouth 2 (two) times daily as needed.    . magnesium 30 MG tablet Take 30 mg by mouth daily.    . Multiple Vitamin (MULTI VITAMIN  DAILY PO) Take 1 tablet by mouth daily.    . Multiple Vitamins-Minerals (MULTIVITAMIN ADULT PO)     . Multiple Vitamins-Minerals (MULTIVITAMIN WITH MINERALS) tablet Take by mouth.    . omega-3 acid ethyl esters (LOVAZA) 1 g capsule Take by mouth.    . Omega-3 Fatty Acids (FISH OIL) 1000 MG CAPS Take 1,000 mg by mouth daily.    . Omeprazole (RA OMEPRAZOLE) 20 MG TBEC Take 20 mg by mouth.    . saccharomyces boulardii (FLORASTOR) 250 MG capsule Take 250 mg by mouth 2 (two) times daily.    . vitamin A 10000 UNIT capsule Take 10,000 Units by mouth daily.     No current  facility-administered medications for this visit.    SURGICAL HISTORY:  Past Surgical History  Procedure Laterality Date  . Hernia repair  2011  . Colonoscopy w/ biopsies and polypectomy    . Wisdom tooth extraction    . Tonsillectomy    . Video bronchoscopy with endobronchial ultrasound N/A 04/18/2015    Procedure: VIDEO BRONCHOSCOPY WITH ENDOBRONCHIAL ULTRASOUND;  Surgeon: Grace Isaac, MD;  Location: [place] [facility];  Service: Thoracic;  Laterality: N/A;  . Inguinal hernia repair Left 07/15/2015    Procedure: OPEN LEFT INGUINAL HERNIA REPAIR;  Surgeon: Johnathan Hausen, MD;  Location: [facility];  Service: General;  Laterality: Left;  With MESH  . Video bronchoscopy with insertion of interbronchial valve (ibv) N/A 08/02/2015    Procedure: VIDEO BRONCHOSCOPY WITH ATTEMPTED INSERTION OF INTERBRONCHIAL VALVE (IBV) WITH FLUROSCOPY;  Surgeon: Grace Isaac, MD;  Location: [place];  Service: Thoracic;  Laterality: N/A;  . Chest tube insertion Right 08/02/2015    Procedure: INSERTION OF SECOND RIGHT CHEST TUBE WITH FLUROSCOPY;  Surgeon: Grace Isaac, MD;  Location: [place];  Service: Thoracic;  Laterality: Right;  . Chest tube insertion  07/28/2015    REVIEW OF SYSTEMS:  A comprehensive review of systems was negative except for: Constitutional: positive for fatigue   PHYSICAL EXAMINATION: General appearance: alert, cooperative and no distress Head: Normocephalic, without obvious abnormality, atraumatic Neck: no adenopathy, no JVD, supple, symmetrical, trachea midline and thyroid not enlarged, symmetric, no tenderness/mass/nodules Lymph nodes: Cervical, supraclavicular, and axillary nodes normal. Resp: clear to auscultation bilaterally Back: symmetric, no curvature. ROM normal. No CVA tenderness. Cardio: regular rate and rhythm, S1, S2 normal, no murmur, click, rub or gallop GI: soft, non-tender; bowel sounds normal; no masses,  no organomegaly Extremities: extremities normal, atraumatic, no cyanosis  or edema Neurologic: Alert and oriented X 3, normal strength and tone. Normal symmetric reflexes. Normal coordination and gait  ECOG PERFORMANCE STATUS: 1 - Symptomatic but completely ambulatory  Blood pressure 143/73, pulse 90, temperature 98 F (36.7 C), temperature source Oral, resp. rate 18, height _0  (1.778 m), weight 130 lb 12.8 oz (59.33 kg), SpO2 98 %.  LABORATORY DATA: Lab Results  Component Value Date   WBC 9.0 04/16/2016   HGB 12.8* 04/16/2016   HCT 38.3* 04/16/2016   MCV 91.0 04/16/2016   PLT 209 04/16/2016      Chemistry      Component Value Date/Time   NA 141 04/16/2016 1153   NA 134* 09/12/2015 0410   K 4.6 04/16/2016 1153   K 3.8 09/12/2015 0410   CL 102 09/12/2015 0410   CO2 27 04/16/2016 1153   CO2 26 09/12/2015 0410   BUN 24.0 04/16/2016 1153   BUN 14 09/12/2015 0410   CREATININE 1.1 04/16/2016 1153   CREATININE 0.79 09/13/2015 0457  Component Value Date/Time   CALCIUM 10.0 04/16/2016 1153   CALCIUM 8.0* 09/12/2015 0410   ALKPHOS 94 04/16/2016 1153   ALKPHOS 181* 09/02/2015 0950   AST 19 04/16/2016 1153   AST 28 09/02/2015 0950   ALT 21 04/16/2016 1153   ALT 33 09/02/2015 0950   BILITOT 0.34 04/16/2016 1153   BILITOT 1.2 09/02/2015 0950       RADIOGRAPHIC STUDIES: No results found.  ASSESSMENT AND PLAN: This is a very pleasant 75 years old white male with stage IIIA non-small cell lung cancer. He is status post a course of concurrent chemoradiation with weekly carboplatin and paclitaxel with partial response. This was followed by 1 cycle of consolidation chemotherapy with carboplatin and gemcitabine but this was discontinued secondary to right pneumothorax and frequent hospitalization. The patient has been on observation for the last few months. The recent CT scan of the chest showed improvement of the right hemithorax but there was enlarging subsided and new/enlarging soft tissue nodule in the left lung suspicious for metastatic  disease. The patient is currently undergoing treatment with immunotherapy with Nivolumab status post 6 cycles. He tolerated his last treatment fairly well. I recommended for him to proceed with cycle #7 of his immunotherapy with Nivolumab today as a scheduled. For pain management, he will continue on his current pain medication. For the mild anemia, I recommended for the patient to start taking over-the-counter iron tablets 1-2 tablets every day with vitamin C or orange juice. The patient would come back for follow-up visit in 2 weeks for reevaluation with repeat blood work before starting cycle #8. The patient was advised to call immediately if he has any concerning symptoms in the interval. He was advised to call immediately if he has any concerning symptoms in the interval. The patient voices understanding of current disease status and treatment options and is in agreement with the current care plan.  All questions were answered. The patient knows to call the clinic with any problems, questions or concerns. We can certainly see the patient much sooner if necessary.  Disclaimer: This note was dictated with voice recognition software. Similar sounding words can inadvertently be transcribed and may not be corrected upon review.

## 2016-04-16 NOTE — Patient Instructions (Signed)
[facility] Discharge Instructions for Patients Receiving Chemotherapy  Today you received the following chemotherapy agents:  Nivolumab.  To help prevent nausea and vomiting after your treatment, we encourage you to take your nausea medication as directed.   If you develop nausea and vomiting that is not controlled by your nausea medication, call the clinic.   BELOW ARE SYMPTOMS THAT SHOULD BE REPORTED IMMEDIATELY:  *FEVER GREATER THAN 100.5 F  *CHILLS WITH OR WITHOUT FEVER  NAUSEA AND VOMITING THAT IS NOT CONTROLLED WITH YOUR NAUSEA MEDICATION  *UNUSUAL SHORTNESS OF BREATH  *UNUSUAL BRUISING OR BLEEDING  TENDERNESS IN MOUTH AND THROAT WITH OR WITHOUT PRESENCE OF ULCERS  *URINARY PROBLEMS  *BOWEL PROBLEMS  UNUSUAL RASH Items with * indicate a potential emergency and should be followed up as soon as possible.  Feel free to call the clinic you have any questions or concerns. The clinic phone number is [phone].  Please show the CHEMO ALERT CARD at check-in to the Emergency Department and triage nurse.   

## 2016-04-16 NOTE — Telephone Encounter (Signed)
Gave and printed appt sched and avs for pt for July and Aug °

## 2016-04-25 ENCOUNTER — Ambulatory Visit: Payer: 59 | Admitting: Internal Medicine

## 2016-04-25 ENCOUNTER — Ambulatory Visit: Payer: 59

## 2016-04-25 ENCOUNTER — Other Ambulatory Visit: Payer: 59

## 2016-04-30 ENCOUNTER — Other Ambulatory Visit (HOSPITAL_BASED_OUTPATIENT_CLINIC_OR_DEPARTMENT_OTHER): Payer: 59

## 2016-04-30 ENCOUNTER — Ambulatory Visit (HOSPITAL_BASED_OUTPATIENT_CLINIC_OR_DEPARTMENT_OTHER): Payer: 59 | Admitting: Internal Medicine

## 2016-04-30 ENCOUNTER — Encounter: Payer: Self-pay | Admitting: Internal Medicine

## 2016-04-30 ENCOUNTER — Ambulatory Visit (HOSPITAL_BASED_OUTPATIENT_CLINIC_OR_DEPARTMENT_OTHER): Payer: 59

## 2016-04-30 VITALS — BP 136/76 | HR 91 | Temp 97.5°F | Resp 18 | Ht 70.0 in | Wt 127.6 lb

## 2016-04-30 DIAGNOSIS — C3491 Malignant neoplasm of unspecified part of right bronchus or lung: Secondary | ICD-10-CM

## 2016-04-30 DIAGNOSIS — Z5112 Encounter for antineoplastic immunotherapy: Secondary | ICD-10-CM | POA: Diagnosis not present

## 2016-04-30 LAB — CBC WITH DIFFERENTIAL/PLATELET
BASO%: 0.9 % (ref 0.0–2.0)
Basophils Absolute: 0.1 10*3/uL (ref 0.0–0.1)
EOS%: 4.1 % (ref 0.0–7.0)
Eosinophils Absolute: 0.4 10*3/uL (ref 0.0–0.5)
HEMATOCRIT: 41.8 % (ref 38.4–49.9)
HEMOGLOBIN: 13.7 g/dL (ref 13.0–17.1)
LYMPH#: 0.8 10*3/uL — AB (ref 0.9–3.3)
LYMPH%: 8 % — ABNORMAL LOW (ref 14.0–49.0)
MCH: 29.9 pg (ref 27.2–33.4)
MCHC: 32.9 g/dL (ref 32.0–36.0)
MCV: 91 fL (ref 79.3–98.0)
MONO#: 0.6 10*3/uL (ref 0.1–0.9)
MONO%: 5.7 % (ref 0.0–14.0)
NEUT%: 81.3 % — ABNORMAL HIGH (ref 39.0–75.0)
NEUTROS ABS: 7.8 10*3/uL — AB (ref 1.5–6.5)
Platelets: 217 10*3/uL (ref 140–400)
RBC: 4.6 10*6/uL (ref 4.20–5.82)
RDW: 14.8 % — ABNORMAL HIGH (ref 11.0–14.6)
WBC: 9.7 10*3/uL (ref 4.0–10.3)

## 2016-04-30 LAB — COMPREHENSIVE METABOLIC PANEL
ALBUMIN: 3.8 g/dL (ref 3.5–5.0)
ALK PHOS: 102 U/L (ref 40–150)
ALT: 19 U/L (ref 0–55)
AST: 17 U/L (ref 5–34)
Anion Gap: 10 mEq/L (ref 3–11)
BUN: 29.4 mg/dL — AB (ref 7.0–26.0)
CALCIUM: 10.2 mg/dL (ref 8.4–10.4)
CO2: 26 mEq/L (ref 22–29)
CREATININE: 1.2 mg/dL (ref 0.7–1.3)
Chloride: 105 mEq/L (ref 98–109)
EGFR: 59 mL/min/{1.73_m2} — ABNORMAL LOW (ref >90)
GLUCOSE: 105 mg/dL (ref 70–140)
Potassium: 3.8 mEq/L (ref 3.5–5.1)
SODIUM: 141 meq/L (ref 136–145)
Total Bilirubin: <0.3 mg/dL (ref 0.20–1.20)
Total Protein: 8.3 g/dL (ref 6.4–8.3)

## 2016-04-30 MED ORDER — SODIUM CHLORIDE 0.9 % IV SOLN
240.0000 mg | Freq: Once | INTRAVENOUS | Status: AC
  Administered 2016-04-30: 240 mg via INTRAVENOUS
  Filled 2016-04-30: qty 20

## 2016-04-30 MED ORDER — SODIUM CHLORIDE 0.9 % IV SOLN
Freq: Once | INTRAVENOUS | Status: AC
  Administered 2016-04-30: 12:00:00 via INTRAVENOUS

## 2016-04-30 NOTE — Progress Notes (Signed)
[place] Telephone:(336) [phone]   [phone]  OFFICE PROGRESS NOTE  Cammy Copa, MD [phone] N. [address] [place] [address]  DIAGNOSIS: Non-small cell carcinoma of right lung, Adenocarcinoma, stage 3  Staging form: Lung, AJCC 7th Edition  Clinical stage from 04/21/2015: Stage IIIA (T2a, N2, M0) - Signed by Curt Bears, MD on 04/21/2015  Foundation One molecular studies reveal that he was positive for [place], [place], and STAG2 S1037f*20. He was negative for: EGFR, ALK, BRAF, MET, RET, ERBB2 and ROS1  PRIOR THERAPY:  1) Concurrent chemoradiation with chemotherapy in the form of weekly carboplatin for AUC 2 paclitaxel 45 mg/m given concurrent with radiation therapy. First cycle started 05/09/2015. Status post 7 cycles. 2) Consolidation chemotherapy with carboplatin for AUC of 5 on day 1 and gemcitabine 1000 MG/M2 on days 1 and 8 every 3 weeks. First dose 08/15/2015. Discontinued secondary to intolerance.  CURRENT THERAPY: Immunotherapy with Nivolumab 240 MG IV every 2 weeks. First dose 01/18/2016. Status post 7 cycles.  INTERVAL HISTORY: Gabriel RAIA75y.o. male returns to the clinic today for follow-up visit. The patient has no complaints today except for intermittent dry cough and wheezing. He tolerated the last cycle of his immunotherapy with Nivolumab fairly well with no significant adverse effects. He denied having any significant skin rash. He denied having any nausea, vomiting, diarrhea or constipation. He continues to have shortness of breath only with exertion. He denied having any fever or chills. He has no nausea or vomiting. He is here to start cycle #8 of his treatment.  MEDICAL HISTORY: Past Medical History  Diagnosis Date  . Spontaneous pneumothorax MANY YRS AGO AND AGAIN 04/04/15    HISTORY OF (ONLY)  . [place]     [place] cancer  . Lung mass     right / HAS HAD RADIATION AND CHEMO FOR LUNG  CANCER  . Inguinal hernia   . Productive cough     "DUE TO RADIATION"  . History of nonmelanoma skin cancer   . Constipation     AFTER ANESTHESIA  . Difficulty sleeping   . Radiation 05/11/15-06/21/15    NSCLCA/right lung  . Pneumothorax on right 08/24/2015    ALLERGIES:  is allergic to aleve.  MEDICATIONS:  Current Outpatient Prescriptions  Medication Sig Dispense Refill  . Arginine (RA L-ARGININE) 1000 MG TABS     . Ascorbic Acid (VITAMIN C) 1000 MG tablet Take 1,000 mg by mouth daily.    . B Complex Vitamins (B COMPLEX PO) Take 1 capsule by mouth daily.     . budesonide-formoterol (SYMBICORT) 160-4.5 MCG/ACT inhaler Inhale 2 puffs into the lungs 2 (two) times daily. 1 Inhaler 2  . CALCIUM CITRATE PO Take 1 tablet by mouth daily.    . Cyanocobalamin (VITAMIN B 12 PO) Take 1 tablet by mouth daily.    .Marland KitchenguaiFENesin (MUCINEX) 600 MG 12 hr tablet Take 1 tablet (600 mg total) by mouth 2 (two) times daily as needed.    . magnesium 30 MG tablet Take 30 mg by mouth daily.    . Multiple Vitamin (MULTI VITAMIN DAILY PO) Take 1 tablet by mouth daily.    . Omega-3 Fatty Acids (FISH OIL) 1000 MG CAPS Take 1,000 mg by mouth daily.    .Marland Kitchensaccharomyces boulardii (FLORASTOR) 250 MG capsule Take 250 mg by mouth 2 (two) times daily.    . vitamin A 10000 UNIT capsule Take 10,000 Units by  mouth daily.    Marland Kitchen acetaminophen (TYLENOL) 325 MG tablet Take 325 mg by mouth daily as needed. Reported on 04/30/2016    . albuterol (PROVENTIL) (2.5 MG/3ML) 0.083% nebulizer solution Take 3 mLs (2.5 mg total) by nebulization every 6 (six) hours as needed for wheezing or shortness of breath (DX: J44.9). (Patient not taking: Reported on 04/16/2016) 75 mL 5  . docusate sodium (COLACE) 100 MG capsule Take 100 mg by mouth daily as needed. Reported on 04/30/2016    . Omeprazole (RA OMEPRAZOLE) 20 MG TBEC Take 20 mg by mouth.    . oxyCODONE (OXY IR/ROXICODONE) 5 MG immediate release tablet Take 5 mg by mouth every 8 (eight) hours  as needed. Reported on 04/30/2016     No current facility-administered medications for this visit.    SURGICAL HISTORY:  Past Surgical History  Procedure Laterality Date  . Hernia repair  2011  . Colonoscopy w/ biopsies and polypectomy    . Wisdom tooth extraction    . Tonsillectomy    . Video bronchoscopy with endobronchial ultrasound N/A 04/18/2015    Procedure: VIDEO BRONCHOSCOPY WITH ENDOBRONCHIAL ULTRASOUND;  Surgeon: Grace Isaac, MD;  Location: [place];  Service: Thoracic;  Laterality: N/A;  . Inguinal hernia repair Left 07/15/2015    Procedure: OPEN LEFT INGUINAL HERNIA REPAIR;  Surgeon: Johnathan Hausen, MD;  Location: [facility];  Service: General;  Laterality: Left;  With MESH  . Video bronchoscopy with insertion of interbronchial valve (ibv) N/A 08/02/2015    Procedure: VIDEO BRONCHOSCOPY WITH ATTEMPTED INSERTION OF INTERBRONCHIAL VALVE (IBV) WITH FLUROSCOPY;  Surgeon: Grace Isaac, MD;  Location: [place];  Service: Thoracic;  Laterality: N/A;  . Chest tube insertion Right 08/02/2015    Procedure: INSERTION OF SECOND RIGHT CHEST TUBE WITH FLUROSCOPY;  Surgeon: Grace Isaac, MD;  Location: [place];  Service: Thoracic;  Laterality: Right;  . Chest tube insertion  07/28/2015    REVIEW OF SYSTEMS:  A comprehensive review of systems was negative except for: Respiratory: positive for cough and dyspnea on exertion   PHYSICAL EXAMINATION: General appearance: alert, cooperative and no distress Head: Normocephalic, without obvious abnormality, atraumatic Neck: no adenopathy, no JVD, supple, symmetrical, trachea midline and thyroid not enlarged, symmetric, no tenderness/mass/nodules Lymph nodes: Cervical, supraclavicular, and axillary nodes normal. Resp: clear to auscultation bilaterally Back: symmetric, no curvature. ROM normal. No CVA tenderness. Cardio: regular rate and rhythm, S1, S2 normal, no murmur, click, rub or gallop GI: soft, non-tender; bowel sounds normal; no masses,  no  organomegaly Extremities: extremities normal, atraumatic, no cyanosis or edema Neurologic: Alert and oriented X 3, normal strength and tone. Normal symmetric reflexes. Normal coordination and gait  ECOG PERFORMANCE STATUS: 1 - Symptomatic but completely ambulatory  Blood pressure 136/76, pulse 91, temperature 97.5 F (36.4 C), temperature source Oral, resp. rate 18, height _0  (1.778 m), weight 127 lb 9.6 oz (57.879 kg), SpO2 99 %.  LABORATORY DATA: Lab Results  Component Value Date   WBC 9.7 04/30/2016   HGB 13.7 04/30/2016   HCT 41.8 04/30/2016   MCV 91.0 04/30/2016   PLT 217 04/30/2016      Chemistry      Component Value Date/Time   NA 141 04/16/2016 1153   NA 134* 09/12/2015 0410   K 4.6 04/16/2016 1153   K 3.8 09/12/2015 0410   CL 102 09/12/2015 0410   CO2 27 04/16/2016 1153   CO2 26 09/12/2015 0410   BUN 24.0 04/16/2016 1153   BUN  14 09/12/2015 0410   CREATININE 1.1 04/16/2016 1153   CREATININE 0.79 09/13/2015 0457      Component Value Date/Time   CALCIUM 10.0 04/16/2016 1153   CALCIUM 8.0* 09/12/2015 0410   ALKPHOS 94 04/16/2016 1153   ALKPHOS 181* 09/02/2015 0950   AST 19 04/16/2016 1153   AST 28 09/02/2015 0950   ALT 21 04/16/2016 1153   ALT 33 09/02/2015 0950   BILITOT 0.34 04/16/2016 1153   BILITOT 1.2 09/02/2015 0950       RADIOGRAPHIC STUDIES: No results found.  ASSESSMENT AND PLAN: This is a very pleasant 75 years old white male with stage IIIA non-small cell lung cancer. He is status post a course of concurrent chemoradiation with weekly carboplatin and paclitaxel with partial response. This was followed by 1 cycle of consolidation chemotherapy with carboplatin and gemcitabine but this was discontinued secondary to right pneumothorax and frequent hospitalization. The patient has been on observation for the last few months. The recent CT scan of the chest showed improvement of the right hemithorax but there was enlarging subsided and  new/enlarging soft tissue nodule in the left lung suspicious for metastatic disease. The patient is currently undergoing treatment with immunotherapy with Nivolumab status post 7 cycles. He tolerated his last treatment fairly well. I recommended for him to proceed with cycle #8 of his immunotherapy with Nivolumab today as a scheduled. For pain management, he will continue on his current pain medication. For the mild anemia, I recommended for the patient to start taking over-the-counter iron tablets 1-2 tablets every day with vitamin C or orange juice. The patient would come back for follow-up visit in 2 weeks for reevaluation with repeat blood work before starting cycle #9 after repeating CT scan of the chest for restaging of his disease. The patient was advised to call immediately if he has any concerning symptoms in the interval. He was advised to call immediately if he has any concerning symptoms in the interval. The patient voices understanding of current disease status and treatment options and is in agreement with the current care plan.  All questions were answered. The patient knows to call the clinic with any problems, questions or concerns. We can certainly see the patient much sooner if necessary.  Disclaimer: This note was dictated with voice recognition software. Similar sounding words can inadvertently be transcribed and may not be corrected upon review.

## 2016-04-30 NOTE — Patient Instructions (Signed)
[facility] Discharge Instructions for Patients Receiving Chemotherapy  Today you received the following chemotherapy agents:  Nivolumab.  To help prevent nausea and vomiting after your treatment, we encourage you to take your nausea medication as directed.   If you develop nausea and vomiting that is not controlled by your nausea medication, call the clinic.   BELOW ARE SYMPTOMS THAT SHOULD BE REPORTED IMMEDIATELY:  *FEVER GREATER THAN 100.5 F  *CHILLS WITH OR WITHOUT FEVER  NAUSEA AND VOMITING THAT IS NOT CONTROLLED WITH YOUR NAUSEA MEDICATION  *UNUSUAL SHORTNESS OF BREATH  *UNUSUAL BRUISING OR BLEEDING  TENDERNESS IN MOUTH AND THROAT WITH OR WITHOUT PRESENCE OF ULCERS  *URINARY PROBLEMS  *BOWEL PROBLEMS  UNUSUAL RASH Items with * indicate a potential emergency and should be followed up as soon as possible.  Feel free to call the clinic you have any questions or concerns. The clinic phone number is [phone].  Please show the CHEMO ALERT CARD at check-in to the Emergency Department and triage nurse.   

## 2016-05-08 ENCOUNTER — Telehealth: Payer: Self-pay | Admitting: *Deleted

## 2016-05-08 NOTE — Telephone Encounter (Signed)
"  I am to have a scan before I see Dr. Julien Nordmann on the 17 th and have not heard anything yet." Called central scheduling to learn this order is incomplete awaiting authorization.  Patient notified and this nurse sent message to Managed Care Team Lead,

## 2016-05-10 ENCOUNTER — Encounter (HOSPITAL_COMMUNITY): Payer: Self-pay

## 2016-05-10 ENCOUNTER — Ambulatory Visit (HOSPITAL_COMMUNITY)
Admission: RE | Admit: 2016-05-10 | Discharge: 2016-05-10 | Disposition: A | Discharge status: Home / Self Care | Payer: 59 | Source: Ambulatory Visit | Attending: Internal Medicine | Admitting: Internal Medicine

## 2016-05-10 DIAGNOSIS — R590 Localized enlarged lymph nodes: Secondary | ICD-10-CM | POA: Diagnosis not present

## 2016-05-10 DIAGNOSIS — C3491 Malignant neoplasm of unspecified part of right bronchus or lung: Secondary | ICD-10-CM | POA: Insufficient documentation

## 2016-05-10 DIAGNOSIS — Z9225 Personal history of immunosupression therapy: Secondary | ICD-10-CM | POA: Diagnosis not present

## 2016-05-10 DIAGNOSIS — Z5112 Encounter for antineoplastic immunotherapy: Secondary | ICD-10-CM

## 2016-05-10 DIAGNOSIS — R918 Other nonspecific abnormal finding of lung field: Secondary | ICD-10-CM | POA: Insufficient documentation

## 2016-05-10 MED ORDER — IOPAMIDOL (ISOVUE-300) INJECTION 61%
75.0000 mL | Freq: Once | INTRAVENOUS | Status: AC | PRN
Start: 2016-05-10 — End: 2016-05-10
  Administered 2016-05-10: 75 mL via INTRAVENOUS

## 2016-05-14 ENCOUNTER — Ambulatory Visit (HOSPITAL_BASED_OUTPATIENT_CLINIC_OR_DEPARTMENT_OTHER): Payer: 59

## 2016-05-14 ENCOUNTER — Encounter: Payer: Self-pay | Admitting: Internal Medicine

## 2016-05-14 ENCOUNTER — Other Ambulatory Visit (HOSPITAL_BASED_OUTPATIENT_CLINIC_OR_DEPARTMENT_OTHER): Payer: 59

## 2016-05-14 ENCOUNTER — Ambulatory Visit (HOSPITAL_BASED_OUTPATIENT_CLINIC_OR_DEPARTMENT_OTHER): Payer: 59 | Admitting: Internal Medicine

## 2016-05-14 ENCOUNTER — Telehealth: Payer: Self-pay | Admitting: Internal Medicine

## 2016-05-14 VITALS — BP 132/77 | HR 102 | Temp 97.7°F | Resp 18 | Ht 70.0 in | Wt 127.0 lb

## 2016-05-14 VITALS — HR 97

## 2016-05-14 DIAGNOSIS — C3491 Malignant neoplasm of unspecified part of right bronchus or lung: Secondary | ICD-10-CM

## 2016-05-14 DIAGNOSIS — D649 Anemia, unspecified: Secondary | ICD-10-CM | POA: Diagnosis not present

## 2016-05-14 DIAGNOSIS — Z5112 Encounter for antineoplastic immunotherapy: Secondary | ICD-10-CM

## 2016-05-14 DIAGNOSIS — R05 Cough: Secondary | ICD-10-CM

## 2016-05-14 DIAGNOSIS — R0609 Other forms of dyspnea: Secondary | ICD-10-CM

## 2016-05-14 DIAGNOSIS — R52 Pain, unspecified: Secondary | ICD-10-CM

## 2016-05-14 LAB — COMPREHENSIVE METABOLIC PANEL
ALBUMIN: 4 g/dL (ref 3.5–5.0)
ALK PHOS: 107 U/L (ref 40–150)
ALT: 18 U/L (ref 0–55)
AST: 18 U/L (ref 5–34)
Anion Gap: 11 mEq/L (ref 3–11)
BILIRUBIN TOTAL: 0.33 mg/dL (ref 0.20–1.20)
BUN: 30 mg/dL — AB (ref 7.0–26.0)
CALCIUM: 10.2 mg/dL (ref 8.4–10.4)
CO2: 25 mEq/L (ref 22–29)
Chloride: 104 mEq/L (ref 98–109)
Creatinine: 1.2 mg/dL (ref 0.7–1.3)
EGFR: 59 mL/min/{1.73_m2} — ABNORMAL LOW (ref >90)
Glucose: 108 mg/dl (ref 70–140)
POTASSIUM: 4 meq/L (ref 3.5–5.1)
Sodium: 141 mEq/L (ref 136–145)
TOTAL PROTEIN: 8.5 g/dL — AB (ref 6.4–8.3)

## 2016-05-14 LAB — CBC WITH DIFFERENTIAL/PLATELET
BASO%: 1 % (ref 0.0–2.0)
BASOS ABS: 0.1 10*3/uL (ref 0.0–0.1)
EOS ABS: 0.5 10*3/uL (ref 0.0–0.5)
EOS%: 5.1 % (ref 0.0–7.0)
HEMATOCRIT: 43.2 % (ref 38.4–49.9)
HEMOGLOBIN: 14.1 g/dL (ref 13.0–17.1)
LYMPH#: 0.8 10*3/uL — AB (ref 0.9–3.3)
LYMPH%: 8.7 % — ABNORMAL LOW (ref 14.0–49.0)
MCH: 29.6 pg (ref 27.2–33.4)
MCHC: 32.7 g/dL (ref 32.0–36.0)
MCV: 90.7 fL (ref 79.3–98.0)
MONO#: 0.6 10*3/uL (ref 0.1–0.9)
MONO%: 6 % (ref 0.0–14.0)
NEUT#: 7.5 10*3/uL — ABNORMAL HIGH (ref 1.5–6.5)
NEUT%: 79.2 % — ABNORMAL HIGH (ref 39.0–75.0)
Platelets: 226 10*3/uL (ref 140–400)
RBC: 4.76 10*6/uL (ref 4.20–5.82)
RDW: 14.7 % — AB (ref 11.0–14.6)
WBC: 9.5 10*3/uL (ref 4.0–10.3)

## 2016-05-14 MED ORDER — SODIUM CHLORIDE 0.9 % IV SOLN
240.0000 mg | Freq: Once | INTRAVENOUS | Status: AC
  Administered 2016-05-14: 240 mg via INTRAVENOUS
  Filled 2016-05-14: qty 20

## 2016-05-14 MED ORDER — SODIUM CHLORIDE 0.9 % IV SOLN
Freq: Once | INTRAVENOUS | Status: AC
  Administered 2016-05-14: 16:00:00 via INTRAVENOUS

## 2016-05-14 NOTE — Progress Notes (Signed)
[place] [phone]   [phone]  OFFICE PROGRESS NOTE  Cammy Copa, MD [phone] [address] [place] [address]  DIAGNOSIS: Non-small cell carcinoma of right lung, Adenocarcinoma, stage 3  Staging form: Lung, AJCC 7th Edition  Clinical stage from 04/21/2015: Stage IIIA (T2a, N2, M0) - Signed by Curt Bears, MD on 04/21/2015  Foundation One molecular studies reveal that he was positive for [place], [place], and STAG2 [id number]. He was negative for: EGFR, ALK, BRAF, MET, RET, ERBB2 and ROS1  PRIOR THERAPY:  1) Concurrent chemoradiation with chemotherapy in the form of weekly carboplatin for AUC 2 paclitaxel 45 mg/m given concurrent with radiation therapy. First cycle started 05/09/2015. Status post 7 cycles. 2) Consolidation chemotherapy with carboplatin for AUC of 5 on day 1 and gemcitabine 1000 MG/M2 on days 1 and 8 every 3 weeks. First dose 08/15/2015. Discontinued secondary to intolerance.  CURRENT THERAPY: Immunotherapy with Nivolumab 240 MG IV every 2 weeks. First dose 01/18/2016. Status post 8 cycles.  INTERVAL HISTORY: BSAEED [id number]. male returns to the clinic today for follow-up visit accompanied by his daughter and son. The patient has no complaints today except for mild cough and fatigue. He tolerated the last cycle of his immunotherapy with Nivolumab fairly well with no significant adverse effects. He denied having any significant skin rash. He denied having any nausea, vomiting, diarrhea or constipation. He continues to have shortness of breath only with exertion. He denied having any fever or chills. He has no nausea or vomiting. He had repeat CT scan of the chest performed recently and he is here for evaluation and discussion of his scan results.  MEDICAL HISTORY: Past Medical History  Diagnosis Date  . Spontaneous pneumothorax MANY YRS AGO AND AGAIN 04/04/15    HISTORY OF (ONLY)  .  Cancer ([place]     skin cancer  . Lung mass     right / HAS HAD RADIATION AND CHEMO FOR LUNG CANCER  . Inguinal hernia   . Productive cough     "DUE TO RADIATION"  . History of nonmelanoma skin cancer   . Constipation     AFTER ANESTHESIA  . Difficulty sleeping   . Radiation 05/11/15-06/21/15    NSCLCA/right lung  . Pneumothorax on right 08/24/2015    ALLERGIES:  is allergic to aleve.  MEDICATIONS:  Current Outpatient Prescriptions  Medication Sig Dispense Refill  . acetaminophen (TYLENOL) 325 MG tablet Take 325 mg by mouth daily as needed. Reported on 04/30/2016    . albuterol (PROVENTIL) (2.5 MG/3ML) 0.083% nebulizer solution Take 3 mLs (2.5 mg total) by nebulization every 6 (six) hours as needed for wheezing or shortness of breath (DX: J44.9). (Patient not taking: Reported on 04/16/2016) 75 mL 5  . Arginine (RA L-ARGININE) 1000 MG TABS     . Ascorbic Acid (VITAMIN C) 1000 MG tablet Take 1,000 mg by mouth daily.    . B Complex Vitamins (B COMPLEX PO) Take 1 capsule by mouth daily.     . budesonide-formoterol (SYMBICORT) 160-4.5 MCG/ACT inhaler Inhale 2 puffs into the lungs 2 (two) times daily. 1 Inhaler 2  . CALCIUM CITRATE PO Take 1 tablet by mouth daily.    . Cyanocobalamin (VITAMIN B 12 PO) Take 1 tablet by mouth daily.    .Marland Kitchendocusate sodium (COLACE) 100 MG capsule Take 100 mg by mouth daily as needed. Reported on 04/30/2016    . ferrous sulfate 325 (  65 FE) MG tablet Take 325 mg by mouth daily with breakfast.    . guaiFENesin (MUCINEX) 600 MG 12 hr tablet Take 1 tablet (600 mg total) by mouth 2 (two) times daily as needed.    . magnesium 30 MG tablet Take 30 mg by mouth daily.    . Multiple Vitamin (MULTI VITAMIN DAILY PO) Take 1 tablet by mouth daily.    . Omega-3 Fatty Acids (FISH OIL) 1000 MG CAPS Take 1,000 mg by mouth daily.    . Omeprazole (RA OMEPRAZOLE) 20 MG TBEC Take 20 mg by mouth.    . oxyCODONE (OXY IR/ROXICODONE) 5 MG immediate release tablet Take 5 mg by mouth every 8  (eight) hours as needed. Reported on 04/30/2016    . saccharomyces boulardii (FLORASTOR) 250 MG capsule Take 250 mg by mouth 2 (two) times daily.    . vitamin A 10000 UNIT capsule Take 10,000 Units by mouth daily.     No current facility-administered medications for this visit.    SURGICAL HISTORY:  Past Surgical History  Procedure Laterality Date  . Hernia repair  2011  . Colonoscopy w/ biopsies and polypectomy    . Wisdom tooth extraction    . Tonsillectomy    . Video bronchoscopy with endobronchial ultrasound N/A 04/18/2015    Procedure: VIDEO BRONCHOSCOPY WITH ENDOBRONCHIAL ULTRASOUND;  Surgeon: Grace Isaac, MD;  Location: [facility];  Service: Thoracic;  Laterality: N/A;  . Inguinal hernia repair Left 07/15/2015    Procedure: OPEN LEFT INGUINAL HERNIA REPAIR;  Surgeon: Johnathan Hausen, MD;  Location: [facility];  Service: General;  Laterality: Left;  With MESH  . Video bronchoscopy with insertion of interbronchial valve (ibv) N/A 08/02/2015    Procedure: VIDEO BRONCHOSCOPY WITH ATTEMPTED INSERTION OF INTERBRONCHIAL VALVE (IBV) WITH FLUROSCOPY;  Surgeon: Grace Isaac, MD;  Location: [facility];  Service: Thoracic;  Laterality: N/A;  . Chest tube insertion Right 08/02/2015    Procedure: INSERTION OF SECOND RIGHT CHEST TUBE WITH FLUROSCOPY;  Surgeon: Grace Isaac, MD;  Location: [facility];  Service: Thoracic;  Laterality: Right;  . Chest tube insertion  07/28/2015    REVIEW OF SYSTEMS:  Constitutional: positive for fatigue Eyes: negative Ears, nose, mouth, throat, and face: negative Respiratory: positive for cough Cardiovascular: negative Gastrointestinal: negative Genitourinary:negative Integument/breast: negative Hematologic/lymphatic: negative Musculoskeletal:negative Neurological: negative Behavioral/Psych: negative Endocrine: negative Allergic/Immunologic: negative   PHYSICAL EXAMINATION: General appearance: alert, cooperative and no distress Head: Normocephalic, without  obvious abnormality, atraumatic Neck: no adenopathy, no JVD, supple, symmetrical, trachea midline and thyroid not enlarged, symmetric, no tenderness/mass/nodules Lymph nodes: Cervical, supraclavicular, and axillary nodes normal. Resp: clear to auscultation bilaterally Back: symmetric, no curvature. ROM normal. No CVA tenderness. Cardio: regular rate and rhythm, S1, S2 normal, no murmur, click, rub or gallop GI: soft, non-tender; bowel sounds normal; no masses,  no organomegaly Extremities: extremities normal, atraumatic, no cyanosis or edema Neurologic: Alert and oriented X 3, normal strength and tone. Normal symmetric reflexes. Normal coordination and gait  ECOG PERFORMANCE STATUS: 1 - Symptomatic but completely ambulatory  Blood pressure 132/77, pulse 102, temperature 97.7 F (36.5 C), temperature source Oral, resp. rate 18, height 5' 10"  (1.778 m), weight 127 lb (57.607 kg), SpO2 99 %.  LABORATORY DATA: Lab Results  Component Value Date   WBC 9.5 05/14/2016   HGB 14.1 05/14/2016   HCT 43.2 05/14/2016   MCV 90.7 05/14/2016   PLT 226 05/14/2016      Chemistry      Component Value  Date/Time   NA 141 05/14/2016 1457   NA 134* 09/12/2015 0410   K 4.0 05/14/2016 1457   K 3.8 09/12/2015 0410   CL 102 09/12/2015 0410   CO2 25 05/14/2016 1457   CO2 26 09/12/2015 0410   BUN 30.0* 05/14/2016 1457   BUN 14 09/12/2015 0410   CREATININE 1.2 05/14/2016 1457   CREATININE 0.79 09/13/2015 0457      Component Value Date/Time   CALCIUM 10.2 05/14/2016 1457   CALCIUM 8.0* 09/12/2015 0410   ALKPHOS 107 05/14/2016 1457   ALKPHOS 181* 09/02/2015 0950   AST 18 05/14/2016 1457   AST 28 09/02/2015 0950   ALT 18 05/14/2016 1457   ALT 33 09/02/2015 0950   BILITOT 0.33 05/14/2016 1457   BILITOT 1.2 09/02/2015 0950       RADIOGRAPHIC STUDIES: Ct Chest W Contrast  05/10/2016  CLINICAL DATA:  Non-small cell carcinoma of right lung, Adenocarcinoma, stage 3. Chemotherapy radiation therapy  complete. Initial diagnosis the 6/ 2016 Immunotherapy: Nivolumab 240 MG IV every 2 weeks. First dose 01/18/2016. Status post 7 cycles. EXAM: CT CHEST WITH CONTRAST TECHNIQUE: Multidetector CT imaging of the chest was performed during intravenous contrast administration. CONTRAST:  53m ISOVUE-300 IOPAMIDOL (ISOVUE-300) INJECTION 61% COMPARISON:  Chest CT 03/12/2016 FINDINGS: Cardiovascular: No central pulmonary embolism. Aorta is normal. No pericardial fluid. Mediastinum/Nodes: RIGHT paratracheal lymph nodes are similar measuring 9 mm image 52, series 2. The peribronchial thickening along the bronchus intermedius is not changed. Lungs/Pleura: Volume loss in the RIGHT hemi thorax. Within the RIGHT lung apex there is fluid consolidation with extensive bronchiectasis not changed from comparison exam. No discrete nodularity. Linear thickening along the fissure similar prior. RIGHT pleural-parenchymal thickening also stable (image 1 series 6, series 5. Within the LEFT lower lobe, spiculated semi-solid nodule measuring 15 mm (image 136, series 5) is not changed size prior. Several thin-walled cystic lesions in the lower pole of the superior segment of the LEFT lower lobe are unchanged. 6 mm nodule in the superior segment LEFT lower lobe (image 65, series 2 is unchanged. Upper Abdomen: Limited view of the liver, kidneys, pancreas are unremarkable. Normal adrenal glands. Musculoskeletal: No aggressive osseous lesion. IMPRESSION: 1. Postsurgical change in the RIGHT hemi thorax with consolidation in the upper RIGHT hemi thorax with air bronchograms. 2. Stable mild RIGHT paratracheal mediastinal adenopathy and peribronchial thickening along the bronchus intermedius. 3. Stable LEFT lower lobe sub solid spiculated nodule. 4. Additional cystic and solid nodules in the LEFT lower lobe are unchanged. Electronically Signed   By: SSuzy BouchardM.D.   On: 05/10/2016 16:53    ASSESSMENT AND PLAN: This is a very pleasant 75years  old white male with stage IIIA non-small cell lung cancer. He is status post a course of concurrent chemoradiation with weekly carboplatin and paclitaxel with partial response. This was followed by 1 cycle of consolidation chemotherapy with carboplatin and gemcitabine but this was discontinued secondary to right pneumothorax and frequent hospitalization. The patient has been on observation for the last few months. The recent CT scan of the chest showed improvement of the right hemithorax but there was enlarging subsided and new/enlarging soft tissue nodule in the left lung suspicious for metastatic disease. The patient is currently undergoing treatment with immunotherapy with Nivolumab status post 8 cycles. He tolerated his last treatment fairly well. The recent CT scan of the chest showed no evidence for disease progression. I discussed the scan results with the patient and his family. I recommended for him  to proceed with cycle #9 of his immunotherapy with Nivolumab today as a scheduled. For pain management, he will continue on his current pain medication. For the mild anemia, I recommended for the patient to start taking over-the-counter iron tablets 1-2 tablets every day with vitamin C or orange juice. The patient would come back for follow-up visit in 2 weeks for reevaluation with repeat blood work before starting cycle #10. The patient was advised to call immediately if he has any concerning symptoms in the interval. He was advised to call immediately if he has any concerning symptoms in the interval. The patient voices understanding of current disease status and treatment options and is in agreement with the current care plan.  All questions were answered. The patient knows to call the clinic with any problems, questions or concerns. We can certainly see the patient much sooner if necessary.  Disclaimer: This note was dictated with voice recognition software. Similar sounding words can  inadvertently be transcribed and may not be corrected upon review.

## 2016-05-14 NOTE — Telephone Encounter (Signed)
Pt will get updated sched in tx room °

## 2016-05-14 NOTE — Patient Instructions (Signed)
[facility] Discharge Instructions for Patients Receiving Chemotherapy  Today you received the following chemotherapy agents Opdivo.  To help prevent nausea and vomiting after your treatment, we encourage you to take your nausea medication as directed.   If you develop nausea and vomiting that is not controlled by your nausea medication, call the clinic.   BELOW ARE SYMPTOMS THAT SHOULD BE REPORTED IMMEDIATELY:  *FEVER GREATER THAN 100.5 F  *CHILLS WITH OR WITHOUT FEVER  NAUSEA AND VOMITING THAT IS NOT CONTROLLED WITH YOUR NAUSEA MEDICATION  *UNUSUAL SHORTNESS OF BREATH  *UNUSUAL BRUISING OR BLEEDING  TENDERNESS IN MOUTH AND THROAT WITH OR WITHOUT PRESENCE OF ULCERS  *URINARY PROBLEMS  *BOWEL PROBLEMS  UNUSUAL RASH Items with * indicate a potential emergency and should be followed up as soon as possible.  Feel free to call the clinic you have any questions or concerns. The clinic phone number is [phone].  Please show the CHEMO ALERT CARD at check-in to the Emergency Department and triage nurse.    

## 2016-05-15 ENCOUNTER — Ambulatory Visit (INDEPENDENT_AMBULATORY_CARE_PROVIDER_SITE_OTHER): Payer: 59 | Admitting: Internal Medicine

## 2016-05-15 ENCOUNTER — Telehealth: Payer: Self-pay

## 2016-05-15 ENCOUNTER — Encounter: Payer: Self-pay | Admitting: Internal Medicine

## 2016-05-15 VITALS — BP 128/72 | HR 98 | Ht 70.0 in | Wt 127.0 lb

## 2016-05-15 DIAGNOSIS — R530 Neoplastic (malignant) related fatigue: Secondary | ICD-10-CM | POA: Insufficient documentation

## 2016-05-15 DIAGNOSIS — J449 Chronic obstructive pulmonary disease, unspecified: Secondary | ICD-10-CM

## 2016-05-15 DIAGNOSIS — R5383 Other fatigue: Secondary | ICD-10-CM | POA: Diagnosis not present

## 2016-05-15 NOTE — Progress Notes (Signed)
Subjective:     Patient ID: Gabriel [name], male   DOB: Feb 13, 1941, 75 y.o.   MRN: [id number]  HPI    OV 05/15/2016  Chief Complaint  Patient presents with  . Follow-up    pt has a prod cough with white mucus, denies any other breathing complaints.      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 Hx of lung abscess rt side long hospiotalization fall 2016 wityh resolution with resultant rt fibrothorax RUL  He continues to do well. He is on immunotherapy. His functional status is very good ECOG he does have fatigue which is improved since the hospitalization in for 2016 but worse compared to the baseline of Summer 2016. This is improved after some iron supplementation. He is working with Dr. Julien Nordmann on this issue. He is exercising. He has some questions about incentive spirometry.  He did have overnight oxygen test April 2017 after his last visit. He is inquiring about the results. We do not have the results in our computer. We will get this tomorrow.     has a past medical history of Spontaneous pneumothorax (MANY YRS AGO AND AGAIN 04/04/15); Cancer ([place]); Lung mass; Inguinal hernia; Productive cough; History of nonmelanoma skin cancer; Constipation; Difficulty sleeping; Radiation (05/11/15-06/21/15); and Pneumothorax on right (08/24/2015).   reports that he has been smoking Cigarettes.  He has a 27.5 pack-year smoking history. He has never used smokeless tobacco.  Past Surgical History  Procedure Laterality Date  . Hernia repair  2011  . Colonoscopy w/ biopsies and polypectomy    . Wisdom tooth extraction    . Tonsillectomy    . Video bronchoscopy with endobronchial ultrasound N/A 04/18/2015    Procedure: VIDEO BRONCHOSCOPY WITH ENDOBRONCHIAL ULTRASOUND;  Surgeon: Grace Isaac, MD;  Location: [facility];  Service: Thoracic;  Laterality: N/A;  . Inguinal hernia repair Left 07/15/2015    Procedure: OPEN LEFT INGUINAL HERNIA REPAIR;  Surgeon: Johnathan Hausen, MD;   Location: [facility];  Service: General;  Laterality: Left;  With MESH  . Video bronchoscopy with insertion of interbronchial valve (ibv) N/A 08/02/2015    Procedure: VIDEO BRONCHOSCOPY WITH ATTEMPTED INSERTION OF INTERBRONCHIAL VALVE (IBV) WITH FLUROSCOPY;  Surgeon: Grace Isaac, MD;  Location: [facility];  Service: Thoracic;  Laterality: N/A;  . Chest tube insertion Right 08/02/2015    Procedure: INSERTION OF SECOND RIGHT CHEST TUBE WITH FLUROSCOPY;  Surgeon: Grace Isaac, MD;  Location: [facility];  Service: Thoracic;  Laterality: Right;  . Chest tube insertion  07/28/2015    Allergies  Allergen Reactions  . Aleve [Naproxen] Rash    Immunization History  Administered Date(s) Administered  . Pneumococcal Conjugate-13 02/14/2016    Family History  Problem Relation Age of Onset  . Heart attack Father   . Hypertension Mother   . Heart disease Father      Current outpatient prescriptions:  .  acetaminophen (TYLENOL) 325 MG tablet, Take 325 mg by mouth daily as needed. Reported on 04/30/2016, Disp: , Rfl:  .  Arginine (RA L-ARGININE) 1000 MG TABS, Take 500 mg by mouth daily. , Disp: , Rfl:  .  Ascorbic Acid (VITAMIN C) 1000 MG tablet, Take 1,000 mg by mouth daily. Pt takes 2 '500mg'$  tabs, Disp: , Rfl:  .  B Complex Vitamins (B COMPLEX PO), Take 1 capsule by mouth daily. , Disp: , Rfl:  .  budesonide-formoterol (SYMBICORT) 160-4.5 MCG/ACT inhaler, Inhale 2 puffs into the lungs 2 (two)  times daily., Disp: 1 Inhaler, Rfl: 2 .  CALCIUM CITRATE PO, Take 1 tablet by mouth daily., Disp: , Rfl:  .  Cyanocobalamin (VITAMIN B 12 PO), Take 1 tablet by mouth daily., Disp: , Rfl:  .  ferrous sulfate 325 (65 FE) MG tablet, Take 325 mg by mouth daily with breakfast., Disp: , Rfl:  .  guaiFENesin (MUCINEX) 600 MG 12 hr tablet, Take 1 tablet (600 mg total) by mouth 2 (two) times daily as needed., Disp: , Rfl:  .  magnesium 30 MG tablet, Take 30 mg by mouth daily., Disp: , Rfl:  .  Multiple Vitamin (MULTI  VITAMIN DAILY PO), Take 1 tablet by mouth daily., Disp: , Rfl:  .  Omega-3 Fatty Acids (FISH OIL) 1000 MG CAPS, Take 1,000 mg by mouth daily., Disp: , Rfl:  .  Omeprazole (RA OMEPRAZOLE) 20 MG TBEC, Take 20 mg by mouth., Disp: , Rfl:  .  oxyCODONE (OXY IR/ROXICODONE) 5 MG immediate release tablet, Take 5 mg by mouth every 8 (eight) hours as needed. Reported on 04/30/2016, Disp: , Rfl:  .  saccharomyces boulardii (FLORASTOR) 250 MG capsule, Take 250 mg by mouth 2 (two) times daily., Disp: , Rfl:  .  vitamin A 10000 UNIT capsule, Take 10,000 Units by mouth daily., Disp: , Rfl:  .  albuterol (PROVENTIL) (2.5 MG/3ML) 0.083% nebulizer solution, Take 3 mLs (2.5 mg total) by nebulization every 6 (six) hours as needed for wheezing or shortness of breath (DX: J44.9). (Patient not taking: Reported on 04/16/2016), Disp: 75 mL, Rfl: 5    Review of Systems     Objective:   Physical Exam  Constitutional: He is oriented to person, place, and time. He appears well-developed and well-nourished. No distress.  Body mass index is 18.22 kg/(m^2).   HENT:  Head: Normocephalic and atraumatic.  Right Ear: External ear normal.  Left Ear: External ear normal.  Mouth/Throat: Oropharynx is clear and moist. No oropharyngeal exudate.  Eyes: Conjunctivae and EOM are normal. Pupils are equal, round, and reactive to light. Right eye exhibits no discharge. Left eye exhibits no discharge. No scleral icterus.  Neck: Normal range of motion. Neck supple. No JVD present. No tracheal deviation present. No thyromegaly present.  Cardiovascular: Normal rate, regular rhythm and intact distal pulses.  Exam reveals no gallop and no friction rub.   No murmur heard. Pulmonary/Chest: Effort normal and breath sounds normal. No respiratory distress. He has no wheezes. He has no rales. He exhibits no tenderness.  Rt UL - bronchial breath sound  Abdominal: Soft. Bowel sounds are normal. He exhibits no distension and no mass. There is no  tenderness. There is no rebound and no guarding.  Musculoskeletal: Normal range of motion. He exhibits no edema or tenderness.  kyphisis with rt shoulder droop  Lymphadenopathy:    He has no cervical adenopathy.  Neurological: He is alert and oriented to person, place, and time. He has normal reflexes. No cranial nerve deficit. Coordination normal.  Skin: Skin is warm and dry. No rash noted. He is not diaphoretic. No erythema. No pallor.  Psychiatric: He has a normal mood and affect. His behavior is normal. Judgment and thought content normal.  Nursing note and vitals reviewed.   Filed Vitals:   05/15/16 1614  BP: 128/72  Pulse: 98  Height: '5\' 10"'$  (1.778 m)  Weight: 127 lb (57.607 kg)  SpO2: 98%   Body mass index is 18.22 kg/(m^2).      Assessment:  ICD-9-CM ICD-10-CM   1. Chronic obstructive pulmonary disease, unspecified COPD type ([place]) 496 J44.9   2. Other fatigue 780.79 R53.83        Plan:       Stable disease Noreene Filbert is likely multifactorial  Plan Continue symbicort Will enquire about the ONO test done in April 2017; not sure how I/we missed the results  - apologize for oversight  - if abnormal I am afraid we might have to retest you again  Followup  spirometry with Tammy WIlson (no lung volume, no bd response, no dlco) in 3 months  return to see me in 3 months   Dr. Brand Males, M.D., [facility].C.P Pulmonary and Critical Care Medicine Staff Physician [facility] [facility] Pager: [phone], If no answer or between  15:00h - 7:00h: call [phone]  05/15/2016 5:08 PM

## 2016-05-15 NOTE — Telephone Encounter (Signed)
Called [facility] and spoke with Zenia Resides, we need to request ONO results from April/May 2017 to be faxed to our office.  Respiratory dept is already closed, so email has been sent to them to return my call.  Will hold phone note to follow up on.

## 2016-05-15 NOTE — Patient Instructions (Addendum)
ICD-9-CM ICD-10-CM   1. Chronic obstructive pulmonary disease, unspecified COPD type ([place]) 496 J44.9   2. Other fatigue 780.79 R53.83     Stable disease Noreene Filbert is likely multifactorial  Plan Continue symbicort Will enquire about the ONO test done in April 2017; not sure how I/we missed the results  - apologize for oversight  - if abnormal I am afraid we might have to retest you again  Followup  spirometry with Tammy WIlson (no lung volume, no bd response, no dlco) in 3 months  return to see me in 3 months

## 2016-05-16 NOTE — Telephone Encounter (Signed)
Staff message from [place] at [facility]:  Everlena Cooper.  I got the message that you called in last night for this pt's ONO from May. I just faxed another copy to you at the [phone] fax number. Let us know if you need anything else.  Thanks!  Melissa   Will hold message until I receive physical copy of ONO

## 2016-05-22 ENCOUNTER — Telehealth: Payer: Self-pay | Admitting: Internal Medicine

## 2016-05-22 NOTE — Telephone Encounter (Signed)
PAL - moved from MM to CB ok per MM/CB. Left message for patient and mailed schedule.

## 2016-05-25 ENCOUNTER — Telehealth: Payer: Self-pay | Admitting: Internal Medicine

## 2016-05-25 DIAGNOSIS — J449 Chronic obstructive pulmonary disease, unspecified: Secondary | ICD-10-CM

## 2016-05-25 NOTE — Telephone Encounter (Signed)
ono on room air 02/27/16 reviewed - time < 88% was 3.42mn; does not need night o2  Dr. MBrand Males M.D., [facility].P Pulmonary and Critical Care Medicine Staff Physician [facility] Pager: [phone] If no answer or between  15:00h - 7:00h: call [phone]  05/25/2016 5:01 PM

## 2016-05-28 ENCOUNTER — Other Ambulatory Visit (HOSPITAL_BASED_OUTPATIENT_CLINIC_OR_DEPARTMENT_OTHER): Payer: 59

## 2016-05-28 ENCOUNTER — Ambulatory Visit (HOSPITAL_BASED_OUTPATIENT_CLINIC_OR_DEPARTMENT_OTHER): Payer: 59

## 2016-05-28 ENCOUNTER — Encounter: Payer: Self-pay | Admitting: Nurse Practitioner

## 2016-05-28 ENCOUNTER — Ambulatory Visit (HOSPITAL_BASED_OUTPATIENT_CLINIC_OR_DEPARTMENT_OTHER): Payer: 59 | Admitting: Nurse Practitioner

## 2016-05-28 VITALS — BP 118/91 | HR 100 | Temp 97.8°F | Resp 18 | Ht 70.0 in | Wt 126.4 lb

## 2016-05-28 DIAGNOSIS — C3491 Malignant neoplasm of unspecified part of right bronchus or lung: Secondary | ICD-10-CM

## 2016-05-28 DIAGNOSIS — Z5112 Encounter for antineoplastic immunotherapy: Secondary | ICD-10-CM | POA: Diagnosis not present

## 2016-05-28 DIAGNOSIS — R5383 Other fatigue: Secondary | ICD-10-CM | POA: Diagnosis not present

## 2016-05-28 LAB — COMPREHENSIVE METABOLIC PANEL
ALBUMIN: 3.9 g/dL (ref 3.5–5.0)
ALT: 20 U/L (ref 0–55)
AST: 20 U/L (ref 5–34)
Alkaline Phosphatase: 115 U/L (ref 40–150)
Anion Gap: 11 mEq/L (ref 3–11)
BUN: 28.7 mg/dL — AB (ref 7.0–26.0)
CHLORIDE: 102 meq/L (ref 98–109)
CO2: 26 meq/L (ref 22–29)
Calcium: 10.4 mg/dL (ref 8.4–10.4)
Creatinine: 1.2 mg/dL (ref 0.7–1.3)
EGFR: 57 mL/min/{1.73_m2} — ABNORMAL LOW (ref >90)
GLUCOSE: 97 mg/dL (ref 70–140)
POTASSIUM: 4.1 meq/L (ref 3.5–5.1)
SODIUM: 139 meq/L (ref 136–145)
Total Bilirubin: 0.38 mg/dL (ref 0.20–1.20)
Total Protein: 8.4 g/dL — ABNORMAL HIGH (ref 6.4–8.3)

## 2016-05-28 LAB — CBC WITH DIFFERENTIAL/PLATELET
BASO%: 0.9 % (ref 0.0–2.0)
BASOS ABS: 0.1 10*3/uL (ref 0.0–0.1)
EOS%: 4.1 % (ref 0.0–7.0)
Eosinophils Absolute: 0.3 10*3/uL (ref 0.0–0.5)
HCT: 44.7 % (ref 38.4–49.9)
HEMOGLOBIN: 14.9 g/dL (ref 13.0–17.1)
LYMPH%: 10.1 % — ABNORMAL LOW (ref 14.0–49.0)
MCH: 29.8 pg (ref 27.2–33.4)
MCHC: 33.2 g/dL (ref 32.0–36.0)
MCV: 89.8 fL (ref 79.3–98.0)
MONO#: 0.6 10*3/uL (ref 0.1–0.9)
MONO%: 6.5 % (ref 0.0–14.0)
NEUT#: 6.7 10*3/uL — ABNORMAL HIGH (ref 1.5–6.5)
NEUT%: 78.4 % — ABNORMAL HIGH (ref 39.0–75.0)
Platelets: 202 10*3/uL (ref 140–400)
RBC: 4.98 10*6/uL (ref 4.20–5.82)
RDW: 14.8 % — AB (ref 11.0–14.6)
WBC: 8.5 10*3/uL (ref 4.0–10.3)
lymph#: 0.9 10*3/uL (ref 0.9–3.3)

## 2016-05-28 MED ORDER — SODIUM CHLORIDE 0.9 % IV SOLN
240.0000 mg | Freq: Once | INTRAVENOUS | Status: AC
  Administered 2016-05-28: 240 mg via INTRAVENOUS
  Filled 2016-05-28: qty 20

## 2016-05-28 MED ORDER — SODIUM CHLORIDE 0.9 % IV SOLN
Freq: Once | INTRAVENOUS | Status: AC
  Administered 2016-05-28: 13:00:00 via INTRAVENOUS

## 2016-05-28 MED ORDER — BUDESONIDE-FORMOTEROL FUMARATE 160-4.5 MCG/ACT IN AERO: INHALATION_SPRAY | RESPIRATORY_TRACT | 0 refills | Status: DC

## 2016-05-28 NOTE — Patient Instructions (Signed)
[facility] Discharge Instructions for Patients Receiving Chemotherapy  Today you received the following chemotherapy agents:  Nivolumab.  To help prevent nausea and vomiting after your treatment, we encourage you to take your nausea medication as directed.   If you develop nausea and vomiting that is not controlled by your nausea medication, call the clinic.   BELOW ARE SYMPTOMS THAT SHOULD BE REPORTED IMMEDIATELY:  *FEVER GREATER THAN 100.5 F  *CHILLS WITH OR WITHOUT FEVER  NAUSEA AND VOMITING THAT IS NOT CONTROLLED WITH YOUR NAUSEA MEDICATION  *UNUSUAL SHORTNESS OF BREATH  *UNUSUAL BRUISING OR BLEEDING  TENDERNESS IN MOUTH AND THROAT WITH OR WITHOUT PRESENCE OF ULCERS  *URINARY PROBLEMS  *BOWEL PROBLEMS  UNUSUAL RASH Items with * indicate a potential emergency and should be followed up as soon as possible.  Feel free to call the clinic you have any questions or concerns. The clinic phone number is [phone].  Please show the CHEMO ALERT CARD at check-in to the Emergency Department and triage nurse.   

## 2016-05-28 NOTE — Progress Notes (Signed)
Chief Complaint: Established patient  HPI:  Gabriel [name] 75 y.o. male diagnosed with lung cancer.  Currently undergoing Nivolumab therapy.  Patient presents to the [facility] today.  Receive his next cycle of immunotherapy.  He complains of some chronic tiredness; but otherwise denies any new symptoms whatsoever.   Oncology History   Patient presented to ED with SOB and right sided CP.  Work up showed hydropneumothorax.   Non-small cell carcinoma of right lung, stage 3   Staging form: Lung, AJCC 7th Edition     Clinical stage from 04/21/2015: Stage IIIA (T2a, N2, M0) - Signed by Curt Bears, MD on 04/21/2015       Non-small cell carcinoma of right lung, stage 3 ([place])   04/06/2015 Imaging    CT Chest IMPRESSION: Tiny less than 5% right pneumothorax with right chest tube in place. 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  4.7 cm spiculated masslike opacity in central right low     04/12/2015 Imaging    PET scan  4.0 x 3.0 cm hypermetabolic right perihilar mass-like opacity, suspicious for primary bronchogenic neoplasm, as described above. Consider bronchoscopy for further evaluation.  Multiple nodular/cavitary opacities in the left lung     04/18/2015 Pathology Results    1. Lung, biopsy, right middle lobe - NON-SMALL CELL CARCINOMA, CONSISTENT WITH ADENOCARCINOMA. - SEE COMMENT. 2. Lung, biopsy, right lower lobe - NON-SMALL CELL CARCINOMA, CONSISTENT WITH ADENOCARCINOMA     04/18/2015 Surgery    PROCEDURES PERFORMED: Bronchoscopy with biopsy of right lower and right middle lobe bronchus and EBUS with transbronchial biopsy of 10R lymph nodes.     04/21/2015 Initial Diagnosis    Non-small cell carcinoma of right lung, stage 3      Review of Systems  Constitutional: Positive for malaise/fatigue.  All other systems reviewed and are negative.   Past Medical History:  Diagnosis Date  . Cancer ([place])    skin  cancer  . Constipation    AFTER ANESTHESIA  . Difficulty sleeping   . History of nonmelanoma skin cancer   . Inguinal hernia   . Lung mass    right / HAS HAD RADIATION AND CHEMO FOR LUNG CANCER  . Pneumothorax on right 08/24/2015  . Productive cough    "DUE TO RADIATION"  . Radiation 05/11/15-06/21/15   NSCLCA/right lung  . Spontaneous pneumothorax MANY YRS AGO AND AGAIN 04/04/15   HISTORY OF (ONLY)    Past Surgical History:  Procedure Laterality Date  . CHEST TUBE INSERTION Right 08/02/2015   Procedure: INSERTION OF SECOND RIGHT CHEST TUBE WITH FLUROSCOPY;  Surgeon: Grace Isaac, MD;  Location: [facility];  Service: Thoracic;  Laterality: Right;  . CHEST TUBE INSERTION  07/28/2015  . COLONOSCOPY W/ BIOPSIES AND POLYPECTOMY    . HERNIA REPAIR  2011  . INGUINAL HERNIA REPAIR Left 07/15/2015   Procedure: OPEN LEFT INGUINAL HERNIA REPAIR;  Surgeon: Johnathan Hausen, MD;  Location: [facility];  Service: General;  Laterality: Left;  With MESH  . TONSILLECTOMY    . VIDEO BRONCHOSCOPY WITH ENDOBRONCHIAL ULTRASOUND N/A 04/18/2015   Procedure: VIDEO BRONCHOSCOPY WITH ENDOBRONCHIAL ULTRASOUND;  Surgeon: Grace Isaac, MD;  Location: [facility];  Service: Thoracic;  Laterality: N/A;  . VIDEO BRONCHOSCOPY WITH INSERTION OF INTERBRONCHIAL VALVE (IBV) N/A 08/02/2015   Procedure: VIDEO BRONCHOSCOPY WITH ATTEMPTED INSERTION OF INTERBRONCHIAL VALVE (IBV) WITH FLUROSCOPY;  Surgeon: Grace Isaac, MD;  Location: [facility];  Service: Thoracic;  Laterality: N/A;  . WISDOM TOOTH EXTRACTION      has Hemoptysis, unspecified; Non-small cell carcinoma of right lung, stage 3 ([place]); Encounter for antineoplastic chemotherapy; COPD (chronic obstructive pulmonary disease) ([place]); COPD exacerbation ([place]); Right-sided chest pain; Palliative care encounter; Weakness generalized; Pressure ulcer; Encounter for antineoplastic immunotherapy; Chronic obstructive pulmonary disease ([place]); Hoarseness of voice; and Neoplastic malignant  related fatigue on his problem list.    is allergic to aleve [naproxen].    Medication List       Accurate as of 05/28/16  2:16 PM. Always use your most recent med list.          ALPRAZolam 0.5 MG tablet Commonly known as:  XANAX 1 tablet   B COMPLEX PO Take 1 capsule by mouth daily.   budesonide-formoterol 160-4.5 MCG/ACT inhaler Commonly known as:  SYMBICORT 2 puffs BID   CALCIUM CITRATE PO Take 1 tablet by mouth daily.   COLACE 100 MG capsule Generic drug:  docusate sodium 1 capsule as needed   ferrous sulfate 325 (65 FE) MG tablet Take 325 mg by mouth daily with breakfast.   Fish Oil 1000 MG Caps Take 1,000 mg by mouth daily.   guaiFENesin 600 MG 12 hr tablet Commonly known as:  MUCINEX Take 1 tablet (600 mg total) by mouth 2 (two) times daily as needed.   magnesium 30 MG tablet Take 30 mg by mouth daily.   Magnesium Gluconate 550 MG Tabs Take by mouth.   MULTI VITAMIN DAILY PO Take 1 tablet by mouth daily.   oxyCODONE 5 MG immediate release tablet Commonly known as:  Oxy IR/ROXICODONE 1 tablet   PROVENTIL HFA 108 (90 Base) MCG/ACT inhaler Generic drug:  albuterol 1-2 puffs as needed   RA L-ARGININE 1000 MG Tabs Generic drug:  Arginine Take 500 mg by mouth daily.   RA OMEPRAZOLE 20 MG Tbec Generic drug:  Omeprazole Take 20 mg by mouth.   omeprazole 20 MG capsule Commonly known as:  PRILOSEC   saccharomyces boulardii 250 MG capsule Commonly known as:  FLORASTOR Take 250 mg by mouth 2 (two) times daily.   TYLENOL 325 MG tablet Generic drug:  acetaminophen 2 tablets   vitamin A 10000 UNIT capsule 1 capsule with food or milk   VITAMIN B 12 PO Take 1 tablet by mouth daily.   vitamin C 1000 MG tablet Take 1,000 mg by mouth daily. Pt takes 2 526m tabs        PHYSICAL EXAMINATION  Oncology Vitals 05/28/2016 05/15/2016  Height 178 cm 178 cm  Weight 57.335 kg 57.607 kg  Weight (lbs) 126 lbs 6 oz 127 lbs  BMI (kg/m2) 18.14 kg/m2  18.22 kg/m2  Temp 97.8 -  Pulse 100 98  Resp 18 -  SpO2 100 98  BSA (m2) 1.68 m2 1.69 m2   BP Readings from Last 2 Encounters:  05/28/16 (!) 118/91  05/15/16 128/72    Physical Exam  Constitutional: He is oriented to person, place, and time. Vital signs are normal. He appears malnourished. He appears unhealthy. He appears cachectic.  HENT:  Head: Normocephalic and atraumatic.  Mouth/Throat: Oropharynx is clear and moist.  Eyes: Conjunctivae and EOM are normal. Pupils are equal, round, and reactive to light. Right eye exhibits no discharge. Left eye exhibits no discharge. No scleral icterus.  Neck: Normal range of motion. Neck supple. No JVD present. No tracheal deviation present. No thyromegaly present.  Cardiovascular: Normal rate, regular rhythm, normal heart sounds and intact  distal pulses.   Pulmonary/Chest: Effort normal and breath sounds normal.  Abdominal: Soft. Bowel sounds are normal.  Musculoskeletal: Normal range of motion.  Lymphadenopathy:    He has no cervical adenopathy.  Neurological: He is alert and oriented to person, place, and time. Gait normal.  Skin: Skin is warm and dry.  Psychiatric: Affect normal.  Nursing note and vitals reviewed.   LABORATORY DATA:. Appointment on 05/28/2016  Component Date Value Ref Range Status  . WBC 05/28/2016 8.5  4.0 - 10.3 10e3/uL Final  . NEUT# 05/28/2016 6.7* 1.5 - 6.5 10e3/uL Final  . HGB 05/28/2016 14.9  13.0 - 17.1 g/dL Final  . HCT 05/28/2016 44.7  38.4 - 49.9 % Final  . Platelets 05/28/2016 202  140 - 400 10e3/uL Final  . MCV 05/28/2016 89.8  79.3 - 98.0 fL Final  . MCH 05/28/2016 29.8  27.2 - 33.4 pg Final  . MCHC 05/28/2016 33.2  32.0 - 36.0 g/dL Final  . RBC 05/28/2016 4.98  4.20 - 5.82 10e6/uL Final  . RDW 05/28/2016 14.8* 11.0 - 14.6 % Final  . lymph# 05/28/2016 0.9  0.9 - 3.3 10e3/uL Final  . MONO# 05/28/2016 0.6  0.1 - 0.9 10e3/uL Final  . Eosinophils Absolute 05/28/2016 0.3  0.0 - 0.5 10e3/uL Final  .  Basophils Absolute 05/28/2016 0.1  0.0 - 0.1 10e3/uL Final  . NEUT% 05/28/2016 78.4* 39.0 - 75.0 % Final  . LYMPH% 05/28/2016 10.1* 14.0 - 49.0 % Final  . MONO% 05/28/2016 6.5  0.0 - 14.0 % Final  . EOS% 05/28/2016 4.1  0.0 - 7.0 % Final  . BASO% 05/28/2016 0.9  0.0 - 2.0 % Final  . Sodium 05/28/2016 139  136 - 145 mEq/L Final  . Potassium 05/28/2016 4.1  3.5 - 5.1 mEq/L Final  . Chloride 05/28/2016 102  98 - 109 mEq/L Final  . CO2 05/28/2016 26  22 - 29 mEq/L Final  . Glucose 05/28/2016 97  70 - 140 mg/dl Final  . BUN 05/28/2016 28.7* 7.0 - 26.0 mg/dL Final  . Creatinine 05/28/2016 1.2  0.7 - 1.3 mg/dL Final  . Total Bilirubin 05/28/2016 0.38  0.20 - 1.20 mg/dL Final  . Alkaline Phosphatase 05/28/2016 115  40 - 150 U/L Final  . AST 05/28/2016 20  5 - 34 U/L Final  . ALT 05/28/2016 20  0 - 55 U/L Final  . Total Protein 05/28/2016 8.4* 6.4 - 8.3 g/dL Final  . Albumin 05/28/2016 3.9  3.5 - 5.0 g/dL Final  . Calcium 05/28/2016 10.4  8.4 - 10.4 mg/dL Final  . Anion Gap 05/28/2016 11  3 - 11 mEq/L Final  . EGFR 05/28/2016 57* >90 ml/min/1.73 m2 Final    RADIOGRAPHIC STUDIES: No results found.  ASSESSMENT/PLAN:    Non-small cell carcinoma of right lung, stage 3 ([place]) Patient presents to the [facility] today to receive cycle 10 of his of Nivolumab immunotherapy.  He states he's been doing fairly well recently.  He tries to remain active.  His only true complaint is issues with chronic tiredness.  He denies any recent fevers or chills.  Patient also is requesting a one-time refill of his Symbicort; until he can follow-up with his primary care physician.  Blood counts obtained today were all within normal limits.  Patient is afebrile and vital signs are stable.  Patient will proceed today with his immunotherapy as scheduled.  Patient is scheduled to return for labs, follow up visit, and his next cycle of immunotherapy  on 06/11/2016.   Patient stated understanding of all instructions;  and was in agreement with this plan of care. The patient knows to call the clinic with any problems, questions or concerns.   Total time spent with patient was 15 minutes;  with greater than 75 percent of that time spent in face to face counseling regarding patient's symptoms,  and coordination of care and follow up.  Disclaimer:This dictation was prepared with Dragon/digital dictation along with [employer]. Any transcriptional errors that result from this process are unintentional.  Drue Second, NP 05/28/2016

## 2016-05-28 NOTE — Telephone Encounter (Signed)
lmtcb X1 for pt  

## 2016-05-28 NOTE — Assessment & Plan Note (Signed)
Patient presents to the [facility] today to receive cycle 10 of his of Nivolumab immunotherapy.  He states he's been doing fairly well recently.  He tries to remain active.  His only true complaint is issues with chronic tiredness.  He denies any recent fevers or chills.  Patient also is requesting a one-time refill of his Symbicort; until he can follow-up with his primary care physician.  Blood counts obtained today were all within normal limits.  Patient is afebrile and vital signs are stable.  Patient will proceed today with his immunotherapy as scheduled.  Patient is scheduled to return for labs, follow up visit, and his next cycle of immunotherapy on 06/11/2016.

## 2016-05-29 NOTE — Telephone Encounter (Signed)
[phone] pt calling back

## 2016-05-29 NOTE — Telephone Encounter (Signed)
Called and spoke with pt about his results.  Pt voiced his understanding.  MR is it ok for [facility] to pick up his oxygen.  He stated that he did use the oxygen here and there when he was walking.  If pt doesn't need it then pt requested that we contact [facility] to have this picked up.  MR please advise. Thanks

## 2016-05-29 NOTE — Telephone Encounter (Signed)
lmomtcb  

## 2016-06-01 NOTE — Telephone Encounter (Signed)
Order has been placed and pt is aware. Nothing further is needed.

## 2016-06-01 NOTE — Telephone Encounter (Signed)
In apriol 2017 - walking desat - normal Now ONO - may 2017 normal  Plan 1. Pls tel him that the oxygen company never sent [place] the ono result because it was normal; that is why we never knew 2. HE can return the o2 tranks  Dr. Brand Males, M.D., [facility].C.P Pulmonary and Critical Care Medicine Staff Physician [facility] Pager: [phone], If no answer or between  15:00h - 7:00h: call [phone]  06/01/2016 9:26 AM

## 2016-06-11 ENCOUNTER — Encounter: Payer: Self-pay | Admitting: Internal Medicine

## 2016-06-11 ENCOUNTER — Telehealth: Payer: Self-pay | Admitting: Internal Medicine

## 2016-06-11 ENCOUNTER — Ambulatory Visit (HOSPITAL_BASED_OUTPATIENT_CLINIC_OR_DEPARTMENT_OTHER): Payer: 59

## 2016-06-11 ENCOUNTER — Ambulatory Visit (HOSPITAL_BASED_OUTPATIENT_CLINIC_OR_DEPARTMENT_OTHER): Payer: 59 | Admitting: Internal Medicine

## 2016-06-11 ENCOUNTER — Other Ambulatory Visit (HOSPITAL_BASED_OUTPATIENT_CLINIC_OR_DEPARTMENT_OTHER): Payer: 59

## 2016-06-11 VITALS — BP 141/70 | HR 73 | Temp 97.0°F | Resp 18 | Ht 70.0 in | Wt 128.0 lb

## 2016-06-11 DIAGNOSIS — C3491 Malignant neoplasm of unspecified part of right bronchus or lung: Secondary | ICD-10-CM | POA: Diagnosis not present

## 2016-06-11 DIAGNOSIS — Z5112 Encounter for antineoplastic immunotherapy: Secondary | ICD-10-CM

## 2016-06-11 DIAGNOSIS — D649 Anemia, unspecified: Secondary | ICD-10-CM | POA: Diagnosis not present

## 2016-06-11 DIAGNOSIS — R52 Pain, unspecified: Secondary | ICD-10-CM | POA: Diagnosis not present

## 2016-06-11 DIAGNOSIS — R0609 Other forms of dyspnea: Secondary | ICD-10-CM | POA: Diagnosis not present

## 2016-06-11 DIAGNOSIS — R530 Neoplastic (malignant) related fatigue: Secondary | ICD-10-CM

## 2016-06-11 LAB — COMPREHENSIVE METABOLIC PANEL
ALT: 17 U/L (ref 0–55)
ANION GAP: 8 meq/L (ref 3–11)
AST: 17 U/L (ref 5–34)
Albumin: 3.7 g/dL (ref 3.5–5.0)
Alkaline Phosphatase: 101 U/L (ref 40–150)
BUN: 21.8 mg/dL (ref 7.0–26.0)
CALCIUM: 10.4 mg/dL (ref 8.4–10.4)
CHLORIDE: 106 meq/L (ref 98–109)
CO2: 26 meq/L (ref 22–29)
Creatinine: 1.1 mg/dL (ref 0.7–1.3)
EGFR: 63 mL/min/{1.73_m2} — AB (ref >90)
Glucose: 99 mg/dl (ref 70–140)
Potassium: 5.4 mEq/L — ABNORMAL HIGH (ref 3.5–5.1)
Sodium: 140 mEq/L (ref 136–145)
TOTAL PROTEIN: 8 g/dL (ref 6.4–8.3)

## 2016-06-11 LAB — CBC WITH DIFFERENTIAL/PLATELET
BASO%: 0.6 % (ref 0.0–2.0)
Basophils Absolute: 0.1 10*3/uL (ref 0.0–0.1)
EOS ABS: 0.5 10*3/uL (ref 0.0–0.5)
EOS%: 5.3 % (ref 0.0–7.0)
HEMATOCRIT: 42 % (ref 38.4–49.9)
HGB: 13.6 g/dL (ref 13.0–17.1)
LYMPH%: 9.2 % — AB (ref 14.0–49.0)
MCH: 29.5 pg (ref 27.2–33.4)
MCHC: 32.4 g/dL (ref 32.0–36.0)
MCV: 91.1 fL (ref 79.3–98.0)
MONO#: 0.6 10*3/uL (ref 0.1–0.9)
MONO%: 6.9 % (ref 0.0–14.0)
NEUT#: 7.1 10*3/uL — ABNORMAL HIGH (ref 1.5–6.5)
NEUT%: 78 % — AB (ref 39.0–75.0)
PLATELETS: 217 10*3/uL (ref 140–400)
RBC: 4.62 10*6/uL (ref 4.20–5.82)
RDW: 14.5 % (ref 11.0–14.6)
WBC: 9.2 10*3/uL (ref 4.0–10.3)
lymph#: 0.8 10*3/uL — ABNORMAL LOW (ref 0.9–3.3)

## 2016-06-11 MED ORDER — SODIUM CHLORIDE 0.9 % IV SOLN
240.0000 mg | Freq: Once | INTRAVENOUS | Status: AC
  Administered 2016-06-11: 240 mg via INTRAVENOUS
  Filled 2016-06-11: qty 20

## 2016-06-11 MED ORDER — SODIUM CHLORIDE 0.9 % IV SOLN
Freq: Once | INTRAVENOUS | Status: AC
  Administered 2016-06-11: 13:00:00 via INTRAVENOUS

## 2016-06-11 NOTE — Telephone Encounter (Signed)
GAVE PATIENT AVS REPORT AND APPOINTMENT FOR AUGUST AND September.

## 2016-06-11 NOTE — Progress Notes (Signed)
[facility] [phone]   [phone]  OFFICE PROGRESS NOTE  Cammy Copa, MD [phone] [address] [place] [address]  DIAGNOSIS: Non-small cell carcinoma of right lung, Adenocarcinoma, stage 3  Staging form: Lung, AJCC 7th Edition  Clinical stage from 04/21/2015: Stage IIIA (T2a, N2, M0) - Signed by Curt Bears, MD on 04/21/2015  Foundation One molecular studies reveal that he was positive for [place], [place], and STAG2 [id number]. He was negative for: EGFR, ALK, BRAF, MET, RET, ERBB2 and ROS1  PRIOR THERAPY:  1) Concurrent chemoradiation with chemotherapy in the form of weekly carboplatin for AUC 2 paclitaxel 45 mg/m given concurrent with radiation therapy. First cycle started 05/09/2015. Status post 7 cycles. 2) Consolidation chemotherapy with carboplatin for AUC of 5 on day 1 and gemcitabine 1000 MG/M2 on days 1 and 8 every 3 weeks. First dose 08/15/2015. Discontinued secondary to intolerance.  CURRENT THERAPY: Immunotherapy with Nivolumab 240 MG IV every 2 weeks. First dose 01/18/2016. Status post 10 cycles.  INTERVAL HISTORY: Gabriel AKRAM75y.o. male returns to the clinic today for follow-up visit accompanied by his daughter. The patient has no complaints today except for persistent mild cough and fatigue. He tolerated the last cycle of his immunotherapy with Nivolumab fairly well with no significant adverse effects. He denied having any significant skin rash. He denied having any nausea, vomiting, diarrhea or constipation. He continues to have shortness of breath only with exertion. He denied having any fever or chills. He has no nausea or vomiting. He is here to start cycle #11 of his treatment.  MEDICAL HISTORY: Past Medical History:  Diagnosis Date  . Cancer ([place]    skin cancer  . Constipation    AFTER ANESTHESIA  . Difficulty sleeping   . History of nonmelanoma skin cancer   . Inguinal  hernia   . Lung mass    right / HAS HAD RADIATION AND CHEMO FOR LUNG CANCER  . Pneumothorax on right 08/24/2015  . Productive cough    "DUE TO RADIATION"  . Radiation 05/11/15-06/21/15   NSCLCA/right lung  . Spontaneous pneumothorax MANY YRS AGO AND AGAIN 04/04/15   HISTORY OF (ONLY)    ALLERGIES:  is allergic to aleve [naproxen].  MEDICATIONS:  Current Outpatient Prescriptions  Medication Sig Dispense Refill  . acetaminophen (TYLENOL) 325 MG tablet 2 tablets    . albuterol (PROVENTIL HFA) 108 (90 Base) MCG/ACT inhaler 1-2 puffs as needed    . ALPRAZolam (XANAX) 0.5 MG tablet 1 tablet    . Arginine (RA L-ARGININE) 1000 MG TABS Take 500 mg by mouth daily.     . Ascorbic Acid (VITAMIN C) 1000 MG tablet Take 1,000 mg by mouth daily. Pt takes 2 5049mtabs    . B Complex Vitamins (B COMPLEX PO) Take 1 capsule by mouth daily.     . budesonide-formoterol (SYMBICORT) 160-4.5 MCG/ACT inhaler 2 puffs BID 1 Inhaler 0  . CALCIUM CITRATE PO Take 1 tablet by mouth daily.    . Cyanocobalamin (VITAMIN B 12 PO) Take 1 tablet by mouth daily.    . Marland Kitchenocusate sodium (COLACE) 100 MG capsule 1 capsule as needed    . ferrous sulfate 325 (65 FE) MG tablet Take 325 mg by mouth daily with breakfast.    . guaiFENesin (MUCINEX) 600 MG 12 hr tablet Take 1 tablet (600 mg total) by mouth 2 (two) times daily as needed.    . magnesium 30  MG tablet Take 30 mg by mouth daily.    . Magnesium Gluconate 550 MG TABS Take by mouth.    . Multiple Vitamin (MULTI VITAMIN DAILY PO) Take 1 tablet by mouth daily.    . Omega-3 Fatty Acids (FISH OIL) 1000 MG CAPS Take 1,000 mg by mouth daily.    Marland Kitchen omeprazole (PRILOSEC) 20 MG capsule   0  . Omeprazole (RA OMEPRAZOLE) 20 MG TBEC Take 20 mg by mouth.    . oxyCODONE (OXY IR/ROXICODONE) 5 MG immediate release tablet 1 tablet    . saccharomyces boulardii (FLORASTOR) 250 MG capsule Take 250 mg by mouth 2 (two) times daily.    . vitamin A 10000 UNIT capsule 1 capsule with food or milk      No current facility-administered medications for this visit.     SURGICAL HISTORY:  Past Surgical History:  Procedure Laterality Date  . CHEST TUBE INSERTION Right 08/02/2015   Procedure: INSERTION OF SECOND RIGHT CHEST TUBE WITH FLUROSCOPY;  Surgeon: Grace Isaac, MD;  Location: [facility];  Service: Thoracic;  Laterality: Right;  . CHEST TUBE INSERTION  07/28/2015  . COLONOSCOPY W/ BIOPSIES AND POLYPECTOMY    . HERNIA REPAIR  2011  . INGUINAL HERNIA REPAIR Left 07/15/2015   Procedure: OPEN LEFT INGUINAL HERNIA REPAIR;  Surgeon: Johnathan Hausen, MD;  [facility];  Service: General;  Laterality: Left;  With MESH  . TONSILLECTOMY    . VIDEO BRONCHOSCOPY WITH ENDOBRONCHIAL ULTRASOUND N/A 04/18/2015   Procedure: VIDEO BRONCHOSCOPY WITH ENDOBRONCHIAL ULTRASOUND;  Surgeon: Grace Isaac, MD;  Location: [facility];  Service: Thoracic;  Laterality: N/A;  . VIDEO BRONCHOSCOPY WITH INSERTION OF INTERBRONCHIAL VALVE (IBV) N/A 08/02/2015   Procedure: VIDEO BRONCHOSCOPY WITH ATTEMPTED INSERTION OF INTERBRONCHIAL VALVE (IBV) WITH FLUROSCOPY;  Surgeon: Grace Isaac, MD;  Location[facility];  Service: Thoracic;  Laterality: N/A;  . WISDOM TOOTH EXTRACTION      REVIEW OF SYSTEMS:  A comprehensive review of systems was negative except for: Constitutional: positive for fatigue Respiratory: positive for cough   PHYSICAL EXAMINATION: General appearance: alert, cooperative and no distress Head: Normocephalic, without obvious abnormality, atraumatic Neck: no adenopathy, no JVD, supple, symmetrical, trachea midline and thyroid not enlarged, symmetric, no tenderness/mass/nodules Lymph nodes: Cervical, supraclavicular, and axillary nodes normal. Resp: clear to auscultation bilaterally Back: symmetric, no curvature. ROM normal. No CVA tenderness. Cardio: regular rate and rhythm, S1, S2 normal, no murmur, click, rub or gallop GI: soft, non-tender; bowel sounds normal; no masses,  no  organomegaly Extremities: extremities normal, atraumatic, no cyanosis or edema Neurologic: Alert and oriented X 3, normal strength and tone. Normal symmetric reflexes. Normal coordination and gait  ECOG PERFORMANCE STATUS: 1 - Symptomatic but completely ambulatory  Blood pressure (!) 141/70, pulse 73, temperature 97 F (36.1 C), temperature source Oral, resp. rate 18, height 5' 10"  (1.778 m), weight 128 lb (58.1 kg), SpO2 99 %.  LABORATORY DATA: Lab Results  Component Value Date   WBC 9.2 06/11/2016   HGB 13.6 06/11/2016   HCT 42.0 06/11/2016   MCV 91.1 06/11/2016   PLT 217 06/11/2016      Chemistry      Component Value Date/Time   NA 140 06/11/2016 1146   K 5.4 (H) 06/11/2016 1146   CL 102 09/12/2015 0410   CO2 26 06/11/2016 1146   BUN 21.8 06/11/2016 1146   CREATININE 1.1 06/11/2016 1146      Component Value Date/Time   CALCIUM 10.4 06/11/2016 1146  ALKPHOS 101 06/11/2016 1146   AST 17 06/11/2016 1146   ALT 17 06/11/2016 1146   BILITOT <0.30 06/11/2016 1146       RADIOGRAPHIC STUDIES: No results found.  ASSESSMENT AND PLAN: This is a very pleasant 75 years old white male with stage IIIA non-small cell lung cancer. He is status post a course of concurrent chemoradiation with weekly carboplatin and paclitaxel with partial response. This was followed by 1 cycle of consolidation chemotherapy with carboplatin and gemcitabine but this was discontinued secondary to right pneumothorax and frequent hospitalization. The patient has been on observation for the last few months. The recent CT scan of the chest showed improvement of the right hemithorax but there was enlarging subsided and new/enlarging soft tissue nodule in the left lung suspicious for metastatic disease. The patient is currently undergoing treatment with immunotherapy with Nivolumab status post 10 cycles. He tolerated his last treatment fairly well. I recommended for him to proceed with cycle #11 of his  immunotherapy with Nivolumab today as a scheduled. For pain management, he will continue on his current pain medication. For the mild anemia, I recommended for the patient to start taking over-the-counter iron tablets 1-2 tablets every day with vitamin C or orange juice. The patient would come back for follow-up visit in 2 weeks for reevaluation with repeat blood work before starting cycle #12. The patient was advised to call immediately if he has any concerning symptoms in the interval. He was advised to call immediately if he has any concerning symptoms in the interval. The patient voices understanding of current disease status and treatment options and is in agreement with the current care plan.  All questions were answered. The patient knows to call the clinic with any problems, questions or concerns. We can certainly see the patient much sooner if necessary.  Disclaimer: This note was dictated with voice recognition software. Similar sounding words can inadvertently be transcribed and may not be corrected upon review.

## 2016-06-11 NOTE — Patient Instructions (Signed)
[facility] Discharge Instructions for Patients Receiving Chemotherapy  Today you received the following chemotherapy agents: Optivo  To help prevent nausea and vomiting after your treatment, we encourage you to take your nausea medication as directed.  If you develop nausea and vomiting that is not controlled by your nausea medication, call the clinic.   BELOW ARE SYMPTOMS THAT SHOULD BE REPORTED IMMEDIATELY:  *FEVER GREATER THAN 100.5 F  *CHILLS WITH OR WITHOUT FEVER  NAUSEA AND VOMITING THAT IS NOT CONTROLLED WITH YOUR NAUSEA MEDICATION  *UNUSUAL SHORTNESS OF BREATH  *UNUSUAL BRUISING OR BLEEDING  TENDERNESS IN MOUTH AND THROAT WITH OR WITHOUT PRESENCE OF ULCERS  *URINARY PROBLEMS  *BOWEL PROBLEMS  UNUSUAL RASH Items with * indicate a potential emergency and should be followed up as soon as possible.  Feel free to call the clinic you have any questions or concerns. The clinic phone number is [phone].  Please show the CHEMO ALERT CARD at check-in to the Emergency Department and triage nurse.   

## 2016-06-12 ENCOUNTER — Encounter: Payer: Self-pay | Admitting: Internal Medicine

## 2016-06-12 ENCOUNTER — Encounter: Payer: Self-pay | Admitting: *Deleted

## 2016-06-15 ENCOUNTER — Telehealth: Payer: Self-pay | Admitting: Medical Oncology

## 2016-06-15 ENCOUNTER — Encounter: Payer: Self-pay | Admitting: Medical Oncology

## 2016-06-15 NOTE — Progress Notes (Signed)
I told pt that the letter to return to work is on [employer] and he can expect it this weekend or first thing Monday morning. IT will be faxed t [phone].

## 2016-06-15 NOTE — Telephone Encounter (Signed)
Return to work letter needed by Monday so he can start work on Monday. Letter to [place] .

## 2016-06-19 ENCOUNTER — Telehealth: Payer: Self-pay | Admitting: Medical Oncology

## 2016-06-19 NOTE — Telephone Encounter (Signed)
.  Need working fax umber to fax return to work letter-done

## 2016-06-25 ENCOUNTER — Encounter: Payer: Self-pay | Admitting: Internal Medicine

## 2016-06-25 ENCOUNTER — Ambulatory Visit (HOSPITAL_BASED_OUTPATIENT_CLINIC_OR_DEPARTMENT_OTHER): Payer: 59 | Admitting: Internal Medicine

## 2016-06-25 ENCOUNTER — Other Ambulatory Visit (HOSPITAL_BASED_OUTPATIENT_CLINIC_OR_DEPARTMENT_OTHER): Payer: 59

## 2016-06-25 ENCOUNTER — Ambulatory Visit (HOSPITAL_BASED_OUTPATIENT_CLINIC_OR_DEPARTMENT_OTHER): Payer: 59

## 2016-06-25 VITALS — BP 154/77 | HR 78 | Temp 97.6°F | Resp 18 | Ht 70.0 in | Wt 127.1 lb

## 2016-06-25 DIAGNOSIS — Z79899 Other long term (current) drug therapy: Secondary | ICD-10-CM

## 2016-06-25 DIAGNOSIS — C3491 Malignant neoplasm of unspecified part of right bronchus or lung: Secondary | ICD-10-CM

## 2016-06-25 DIAGNOSIS — Z5112 Encounter for antineoplastic immunotherapy: Secondary | ICD-10-CM

## 2016-06-25 DIAGNOSIS — F649 Gender identity disorder, unspecified: Secondary | ICD-10-CM | POA: Diagnosis not present

## 2016-06-25 DIAGNOSIS — R52 Pain, unspecified: Secondary | ICD-10-CM

## 2016-06-25 DIAGNOSIS — R530 Neoplastic (malignant) related fatigue: Secondary | ICD-10-CM

## 2016-06-25 DIAGNOSIS — R05 Cough: Secondary | ICD-10-CM

## 2016-06-25 DIAGNOSIS — M799 Soft tissue disorder, unspecified: Secondary | ICD-10-CM

## 2016-06-25 DIAGNOSIS — R0609 Other forms of dyspnea: Secondary | ICD-10-CM

## 2016-06-25 LAB — COMPREHENSIVE METABOLIC PANEL
ALT: 17 U/L (ref 0–55)
AST: 19 U/L (ref 5–34)
Albumin: 3.9 g/dL (ref 3.5–5.0)
Alkaline Phosphatase: 102 U/L (ref 40–150)
Anion Gap: 10 mEq/L (ref 3–11)
BUN: 27.2 mg/dL — ABNORMAL HIGH (ref 7.0–26.0)
CO2: 25 meq/L (ref 22–29)
CREATININE: 1.1 mg/dL (ref 0.7–1.3)
Calcium: 10.1 mg/dL (ref 8.4–10.4)
Chloride: 108 mEq/L (ref 98–109)
EGFR: 62 mL/min/{1.73_m2} — ABNORMAL LOW (ref >90)
GLUCOSE: 95 mg/dL (ref 70–140)
Potassium: 4.1 mEq/L (ref 3.5–5.1)
SODIUM: 142 meq/L (ref 136–145)
Total Protein: 8.3 g/dL (ref 6.4–8.3)

## 2016-06-25 LAB — CBC WITH DIFFERENTIAL/PLATELET
BASO%: 0.2 % (ref 0.0–2.0)
Basophils Absolute: 0 10*3/uL (ref 0.0–0.1)
EOS%: 3 % (ref 0.0–7.0)
Eosinophils Absolute: 0.3 10*3/uL (ref 0.0–0.5)
HCT: 41 % (ref 38.4–49.9)
HGB: 13.6 g/dL (ref 13.0–17.1)
LYMPH%: 8.9 % — AB (ref 14.0–49.0)
MCH: 29.9 pg (ref 27.2–33.4)
MCHC: 33.2 g/dL (ref 32.0–36.0)
MCV: 90.1 fL (ref 79.3–98.0)
MONO#: 0.7 10*3/uL (ref 0.1–0.9)
MONO%: 6.1 % (ref 0.0–14.0)
NEUT%: 81.8 % — ABNORMAL HIGH (ref 39.0–75.0)
NEUTROS ABS: 8.8 10*3/uL — AB (ref 1.5–6.5)
Platelets: 219 10*3/uL (ref 140–400)
RBC: 4.55 10*6/uL (ref 4.20–5.82)
RDW: 14.5 % (ref 11.0–14.6)
WBC: 10.7 10*3/uL — AB (ref 4.0–10.3)
lymph#: 1 10*3/uL (ref 0.9–3.3)

## 2016-06-25 LAB — TSH: TSH: 0.669 m(IU)/L (ref 0.320–4.118)

## 2016-06-25 MED ORDER — SODIUM CHLORIDE 0.9 % IV SOLN
240.0000 mg | Freq: Once | INTRAVENOUS | Status: AC
  Administered 2016-06-25: 240 mg via INTRAVENOUS
  Filled 2016-06-25: qty 20

## 2016-06-25 MED ORDER — SODIUM CHLORIDE 0.9 % IV SOLN
Freq: Once | INTRAVENOUS | Status: AC
  Administered 2016-06-25: 14:00:00 via INTRAVENOUS

## 2016-06-25 NOTE — Patient Instructions (Signed)
[facility] Discharge Instructions for Patients Receiving Chemotherapy  Today you received the following chemotherapy agents Opdivo.  To help prevent nausea and vomiting after your treatment, we encourage you to take your nausea medication as directed.   If you develop nausea and vomiting that is not controlled by your nausea medication, call the clinic.   BELOW ARE SYMPTOMS THAT SHOULD BE REPORTED IMMEDIATELY:  *FEVER GREATER THAN 100.5 F  *CHILLS WITH OR WITHOUT FEVER  NAUSEA AND VOMITING THAT IS NOT CONTROLLED WITH YOUR NAUSEA MEDICATION  *UNUSUAL SHORTNESS OF BREATH  *UNUSUAL BRUISING OR BLEEDING  TENDERNESS IN MOUTH AND THROAT WITH OR WITHOUT PRESENCE OF ULCERS  *URINARY PROBLEMS  *BOWEL PROBLEMS  UNUSUAL RASH Items with * indicate a potential emergency and should be followed up as soon as possible.  Feel free to call the clinic you have any questions or concerns. The clinic phone number is [phone].  Please show the CHEMO ALERT CARD at check-in to the Emergency Department and triage nurse.    

## 2016-06-25 NOTE — Progress Notes (Signed)
[facility] [phone]   [phone]  OFFICE PROGRESS NOTE  Cammy Copa, MD [phone] [address] [place] [address]  DIAGNOSIS: Non-small cell carcinoma of right lung, Adenocarcinoma, stage 3  Staging form: Lung, AJCC 7th Edition  Clinical stage from 04/21/2015: Stage IIIA (T2a, N2, M0) - Signed by Curt Bears, MD on 04/21/2015  Foundation One molecular studies reveal that he was positive for [place], [place], and STAG2 S1063f*20. He was negative for: EGFR, ALK, BRAF, MET, RET, ERBB2 and ROS1  PRIOR THERAPY:  1) Concurrent chemoradiation with chemotherapy in the form of weekly carboplatin for AUC 2 paclitaxel 45 mg/m given concurrent with radiation therapy. First cycle started 05/09/2015. Status post 7 cycles. 2) Consolidation chemotherapy with carboplatin for AUC of 5 on day 1 and gemcitabine 1000 MG/M2 on days 1 and 8 every 3 weeks. First dose 08/15/2015. Discontinued secondary to intolerance.  CURRENT THERAPY: Immunotherapy with Nivolumab 240 MG IV every 2 weeks. First dose 01/18/2016. Status post 11 cycles.  INTERVAL HISTORY: Gabriel PILE75y.o. male returns to the clinic today for follow-up visit. The patient has no complaints today except for persistent mild cough and fatigue. He has no significant change since the last visit. He tolerated the last cycle of his immunotherapy with Nivolumab fairly well with no significant adverse effects. He denied having any significant skin rash. He denied having any nausea, vomiting, diarrhea or constipation. He continues to have shortness of breath only with exertion. He denied having any fever or chills. He has no nausea or vomiting. He is here to start cycle #12 of his treatment.  MEDICAL HISTORY: Past Medical History:  Diagnosis Date  . Cancer ([place]    skin cancer  . Constipation    AFTER ANESTHESIA  . Difficulty sleeping   . History of nonmelanoma skin  cancer   . Inguinal hernia   . Lung mass    right / HAS HAD RADIATION AND CHEMO FOR LUNG CANCER  . Pneumothorax on right 08/24/2015  . Productive cough    "DUE TO RADIATION"  . Radiation 05/11/15-06/21/15   NSCLCA/right lung  . Spontaneous pneumothorax MANY YRS AGO AND AGAIN 04/04/15   HISTORY OF (ONLY)    ALLERGIES:  is allergic to aleve [naproxen].  MEDICATIONS:  Current Outpatient Prescriptions  Medication Sig Dispense Refill  . acetaminophen (TYLENOL) 325 MG tablet 2 tablets    . albuterol (PROVENTIL HFA) 108 (90 Base) MCG/ACT inhaler 1-2 puffs as needed    . ALPRAZolam (XANAX) 0.5 MG tablet 1 tablet    . Arginine (RA L-ARGININE) 1000 MG TABS Take 500 mg by mouth daily.     . Ascorbic Acid (VITAMIN C) 1000 MG tablet Take 1,000 mg by mouth daily. Pt takes 2 504mtabs    . B Complex Vitamins (B COMPLEX PO) Take 1 capsule by mouth daily.     . budesonide-formoterol (SYMBICORT) 160-4.5 MCG/ACT inhaler 2 puffs BID 1 Inhaler 0  . CALCIUM CITRATE PO Take 1 tablet by mouth daily.    . Cyanocobalamin (VITAMIN B 12 PO) Take 1 tablet by mouth daily.    . Marland Kitchenocusate sodium (COLACE) 100 MG capsule 1 capsule as needed    . ferrous sulfate 325 (65 FE) MG tablet Take 325 mg by mouth daily with breakfast.    . guaiFENesin (MUCINEX) 600 MG 12 hr tablet Take 1 tablet (600 mg total) by mouth 2 (two) times daily as needed.    .Marland Kitchen  magnesium 30 MG tablet Take 30 mg by mouth daily.    . Magnesium Gluconate 550 MG TABS Take by mouth.    . Multiple Vitamin (MULTI VITAMIN DAILY PO) Take 1 tablet by mouth daily.    . Omega-3 Fatty Acids (FISH OIL) 1000 MG CAPS Take 1,000 mg by mouth daily.    Marland Kitchen omeprazole (PRILOSEC) 20 MG capsule   0  . Omeprazole (RA OMEPRAZOLE) 20 MG TBEC Take 20 mg by mouth.    . oxyCODONE (OXY IR/ROXICODONE) 5 MG immediate release tablet 1 tablet    . saccharomyces boulardii (FLORASTOR) 250 MG capsule Take 250 mg by mouth 2 (two) times daily.    . vitamin A 10000 UNIT capsule 1 capsule  with food or milk     No current facility-administered medications for this visit.     SURGICAL HISTORY:  Past Surgical History:  Procedure Laterality Date  . CHEST TUBE INSERTION Right 08/02/2015   Procedure: INSERTION OF SECOND RIGHT CHEST TUBE WITH FLUROSCOPY;  Surgeon: Grace Isaac, MD;  Location: [facility];  Service: Thoracic;  Laterality: Right;  . CHEST TUBE INSERTION  07/28/2015  . COLONOSCOPY W/ BIOPSIES AND POLYPECTOMY    . HERNIA REPAIR  2011  . INGUINAL HERNIA REPAIR Left 07/15/2015   Procedure: OPEN LEFT INGUINAL HERNIA REPAIR;  Surgeon: Johnathan Hausen, MD;  Location: [facility];  Service: General;  Laterality: Left;  With MESH  . TONSILLECTOMY    . VIDEO BRONCHOSCOPY WITH ENDOBRONCHIAL ULTRASOUND N/A 04/18/2015   Procedure: VIDEO BRONCHOSCOPY WITH ENDOBRONCHIAL ULTRASOUND;  Surgeon: Grace Isaac, MD;  Location: [facility];  Service: Thoracic;  Laterality: N/A;  . VIDEO BRONCHOSCOPY WITH INSERTION OF INTERBRONCHIAL VALVE (IBV) N/A 08/02/2015   Procedure: VIDEO BRONCHOSCOPY WITH ATTEMPTED INSERTION OF INTERBRONCHIAL VALVE (IBV) WITH FLUROSCOPY;  Surgeon: Grace Isaac, MD;  Location[facility];  Service: Thoracic;  Laterality: N/A;  . WISDOM TOOTH EXTRACTION      REVIEW OF SYSTEMS:  A comprehensive review of systems was negative except for: Constitutional: positive for fatigue Respiratory: positive for cough   PHYSICAL EXAMINATION: General appearance: alert, cooperative and no distress Head: Normocephalic, without obvious abnormality, atraumatic Neck: no adenopathy, no JVD, supple, symmetrical, trachea midline and thyroid not enlarged, symmetric, no tenderness/mass/nodules Lymph nodes: Cervical, supraclavicular, and axillary nodes normal. Resp: clear to auscultation bilaterally Back: symmetric, no curvature. ROM normal. No CVA tenderness. Cardio: regular rate and rhythm, S1, S2 normal, no murmur, click, rub or gallop GI: soft, non-tender; bowel sounds normal; no masses,  no  organomegaly Extremities: extremities normal, atraumatic, no cyanosis or edema Neurologic: Alert and oriented X 3, normal strength and tone. Normal symmetric reflexes. Normal coordination and gait  ECOG PERFORMANCE STATUS: 1 - Symptomatic but completely ambulatory  Blood pressure (!) 154/77, pulse 78, temperature 97.6 F (36.4 C), temperature source Oral, resp. rate 18, height 5' 10"  (1.778 m), weight 127 lb 1.6 oz (57.7 kg), SpO2 100 %.  LABORATORY DATA: Lab Results  Component Value Date   WBC 10.7 (H) 06/25/2016   HGB 13.6 06/25/2016   HCT 41.0 06/25/2016   MCV 90.1 06/25/2016   PLT 219 06/25/2016      Chemistry      Component Value Date/Time   NA 140 06/11/2016 1146   K 5.4 (H) 06/11/2016 1146   CL 102 09/12/2015 0410   CO2 26 06/11/2016 1146   BUN 21.8 06/11/2016 1146   CREATININE 1.1 06/11/2016 1146      Component Value Date/Time  CALCIUM 10.4 06/11/2016 1146   ALKPHOS 101 06/11/2016 1146   AST 17 06/11/2016 1146   ALT 17 06/11/2016 1146   BILITOT <0.30 06/11/2016 1146       RADIOGRAPHIC STUDIES: No results found.  ASSESSMENT AND PLAN: This is a very pleasant 75 years old white male with stage IIIA non-small cell lung cancer. He is status post a course of concurrent chemoradiation with weekly carboplatin and paclitaxel with partial response. This was followed by 1 cycle of consolidation chemotherapy with carboplatin and gemcitabine but this was discontinued secondary to right pneumothorax and frequent hospitalization. The patient has been on observation for the last few months. The recent CT scan of the chest showed improvement of the right hemithorax but there was enlarging subsided and new/enlarging soft tissue nodule in the left lung suspicious for metastatic disease. The patient is currently undergoing treatment with immunotherapy with Nivolumab status post 11 cycles. He tolerated his last treatment fairly well. I recommended for him to proceed with cycle #12  of his immunotherapy with Nivolumab today as a scheduled. For pain management, he will continue on his current pain medication. For the mild anemia, he will taking over-the-counter iron tablets 1-2 tablets every day with vitamin C or orange juice. The patient would come back for follow-up visit in 2 weeks for reevaluation with repeat blood work before starting cycle #13 after repeating CT scan of the chest for restaging of his disease. The patient was advised to call immediately if he has any concerning symptoms in the interval. He was advised to call immediately if he has any concerning symptoms in the interval. The patient voices understanding of current disease status and treatment options and is in agreement with the current care plan.  All questions were answered. The patient knows to call the clinic with any problems, questions or concerns. We can certainly see the patient much sooner if necessary.  Disclaimer: This note was dictated with voice recognition software. Similar sounding words can inadvertently be transcribed and may not be corrected upon review.

## 2016-07-03 ENCOUNTER — Other Ambulatory Visit: Payer: Self-pay | Admitting: *Deleted

## 2016-07-03 DIAGNOSIS — C3491 Malignant neoplasm of unspecified part of right bronchus or lung: Secondary | ICD-10-CM

## 2016-07-03 MED ORDER — BUDESONIDE-FORMOTEROL FUMARATE 160-4.5 MCG/ACT IN AERO: INHALATION_SPRAY | RESPIRATORY_TRACT | 5 refills | Status: DC

## 2016-07-04 ENCOUNTER — Telehealth: Payer: Self-pay

## 2016-07-04 NOTE — Telephone Encounter (Signed)
Pt called stating he was supposed to have a CT, he states preferably tomorrow. The status is "incomplete" as far as prior authorization. Forwarded message to [facility] and Dr Julien Nordmann RN

## 2016-07-05 ENCOUNTER — Encounter: Payer: 59 | Admitting: Cardiothoracic Surgery

## 2016-07-05 ENCOUNTER — Telehealth: Payer: Self-pay | Admitting: *Deleted

## 2016-07-05 NOTE — Telephone Encounter (Signed)
Oncology Nurse Navigator Documentation  Oncology Nurse Navigator Flowsheets 07/05/2016  Navigator Encounter Type Telephone/Mr. Rog called and stated his CT Chest has not been scheduled.  I contacted pre cert and it had to be authorized.  I was notified at 1:30 scan is authorized.  I called central scheduling and requested appt before 9/11.  I then called patient and gave him the appt.  He is unable to make that appt.  I gave him the phone number to call to change the appt.    Telephone Outgoing Call  Treatment Phase Post-Tx Follow-up  Barriers/Navigation Needs Coordination of Care  Interventions Coordination of Care  Coordination of Care Radiology  Acuity Level 2  Acuity Level 2 Assistance expediting appointments  Time Spent with Patient 5

## 2016-07-06 ENCOUNTER — Ambulatory Visit (HOSPITAL_COMMUNITY): Payer: 59

## 2016-07-06 ENCOUNTER — Ambulatory Visit (HOSPITAL_COMMUNITY)
Admission: RE | Admit: 2016-07-06 | Discharge: 2016-07-06 | Disposition: A | Discharge status: Home / Self Care | Payer: 59 | Source: Ambulatory Visit | Attending: Internal Medicine | Admitting: Internal Medicine

## 2016-07-06 DIAGNOSIS — R918 Other nonspecific abnormal finding of lung field: Secondary | ICD-10-CM | POA: Insufficient documentation

## 2016-07-06 DIAGNOSIS — C3491 Malignant neoplasm of unspecified part of right bronchus or lung: Secondary | ICD-10-CM | POA: Insufficient documentation

## 2016-07-06 DIAGNOSIS — R590 Localized enlarged lymph nodes: Secondary | ICD-10-CM | POA: Insufficient documentation

## 2016-07-06 DIAGNOSIS — R530 Neoplastic (malignant) related fatigue: Secondary | ICD-10-CM | POA: Diagnosis present

## 2016-07-06 DIAGNOSIS — Z5112 Encounter for antineoplastic immunotherapy: Secondary | ICD-10-CM

## 2016-07-06 MED ORDER — IOPAMIDOL (ISOVUE-300) INJECTION 61%
75.0000 mL | Freq: Once | INTRAVENOUS | Status: AC | PRN
  Administered 2016-07-06: 75 mL via INTRAVENOUS

## 2016-07-09 ENCOUNTER — Other Ambulatory Visit (HOSPITAL_BASED_OUTPATIENT_CLINIC_OR_DEPARTMENT_OTHER): Payer: 59

## 2016-07-09 ENCOUNTER — Ambulatory Visit (HOSPITAL_BASED_OUTPATIENT_CLINIC_OR_DEPARTMENT_OTHER): Payer: 59 | Admitting: Internal Medicine

## 2016-07-09 ENCOUNTER — Telehealth: Payer: Self-pay | Admitting: Internal Medicine

## 2016-07-09 ENCOUNTER — Encounter: Payer: Self-pay | Admitting: Internal Medicine

## 2016-07-09 ENCOUNTER — Ambulatory Visit (HOSPITAL_BASED_OUTPATIENT_CLINIC_OR_DEPARTMENT_OTHER): Payer: 59

## 2016-07-09 VITALS — BP 166/79 | HR 92 | Temp 97.7°F | Resp 18 | Ht 70.0 in | Wt 127.5 lb

## 2016-07-09 DIAGNOSIS — Z5112 Encounter for antineoplastic immunotherapy: Secondary | ICD-10-CM

## 2016-07-09 DIAGNOSIS — C3491 Malignant neoplasm of unspecified part of right bronchus or lung: Secondary | ICD-10-CM

## 2016-07-09 DIAGNOSIS — D649 Anemia, unspecified: Secondary | ICD-10-CM

## 2016-07-09 LAB — CBC WITH DIFFERENTIAL/PLATELET
BASO%: 0.4 % (ref 0.0–2.0)
BASOS ABS: 0 10*3/uL (ref 0.0–0.1)
EOS ABS: 0.1 10*3/uL (ref 0.0–0.5)
EOS%: 1.3 % (ref 0.0–7.0)
HEMATOCRIT: 42.8 % (ref 38.4–49.9)
HEMOGLOBIN: 14.2 g/dL (ref 13.0–17.1)
LYMPH#: 0.9 10*3/uL (ref 0.9–3.3)
LYMPH%: 8.8 % — ABNORMAL LOW (ref 14.0–49.0)
MCH: 30 pg (ref 27.2–33.4)
MCHC: 33.2 g/dL (ref 32.0–36.0)
MCV: 90.5 fL (ref 79.3–98.0)
MONO#: 0.7 10*3/uL (ref 0.1–0.9)
MONO%: 7.1 % (ref 0.0–14.0)
NEUT%: 82.4 % — ABNORMAL HIGH (ref 39.0–75.0)
NEUTROS ABS: 8.4 10*3/uL — AB (ref 1.5–6.5)
PLATELETS: 212 10*3/uL (ref 140–400)
RBC: 4.73 10*6/uL (ref 4.20–5.82)
RDW: 14.8 % — AB (ref 11.0–14.6)
WBC: 10.2 10*3/uL (ref 4.0–10.3)

## 2016-07-09 LAB — COMPREHENSIVE METABOLIC PANEL
ALT: 20 U/L (ref 0–55)
AST: 22 U/L (ref 5–34)
Albumin: 3.9 g/dL (ref 3.5–5.0)
Alkaline Phosphatase: 107 U/L (ref 40–150)
Anion Gap: 10 mEq/L (ref 3–11)
BILIRUBIN TOTAL: 0.33 mg/dL (ref 0.20–1.20)
BUN: 21.4 mg/dL (ref 7.0–26.0)
CALCIUM: 10 mg/dL (ref 8.4–10.4)
CO2: 26 mEq/L (ref 22–29)
Chloride: 105 mEq/L (ref 98–109)
Creatinine: 1.1 mg/dL (ref 0.7–1.3)
EGFR: 68 mL/min/{1.73_m2} — AB (ref >90)
Glucose: 90 mg/dl (ref 70–140)
POTASSIUM: 3.8 meq/L (ref 3.5–5.1)
Sodium: 141 mEq/L (ref 136–145)
TOTAL PROTEIN: 8.3 g/dL (ref 6.4–8.3)

## 2016-07-09 MED ORDER — NIVOLUMAB CHEMO INJECTION 100 MG/10ML
240.0000 mg | Freq: Once | INTRAVENOUS | Status: AC
  Administered 2016-07-09: 240 mg via INTRAVENOUS
  Filled 2016-07-09: qty 20

## 2016-07-09 MED ORDER — SODIUM CHLORIDE 0.9 % IV SOLN
Freq: Once | INTRAVENOUS | Status: AC
  Administered 2016-07-09: 13:00:00 via INTRAVENOUS

## 2016-07-09 NOTE — Telephone Encounter (Signed)
Avs report and schd given per 07/09/16 los

## 2016-07-09 NOTE — Progress Notes (Signed)
[facility] [phone]   Fax:(336) [phone]  OFFICE PROGRESS NOTE  Cammy Copa, MD [phone] [address], [address] [place] [address]  DIAGNOSIS: Non-small cell carcinoma of right lung, Adenocarcinoma, stage 3  Staging form: Lung, AJCC 7th Edition  Clinical stage from 04/21/2015: Stage IIIA (T2a, N2, M0) - Signed by Curt Bears, MD on 04/21/2015  Foundation One molecular studies reveal that he was positive for [place], [place], and STAG2 S1039f*20. He was negative for: EGFR, ALK, BRAF, MET, RET, ERBB2 and ROS1  PRIOR THERAPY:  1) Concurrent chemoradiation with chemotherapy in the form of weekly carboplatin for AUC 2 paclitaxel 45 mg/m given concurrent with radiation therapy. First cycle started 05/09/2015. Status post 7 cycles. 2) Consolidation chemotherapy with carboplatin for AUC of 5 on day 1 and gemcitabine 1000 MG/M2 on days 1 and 8 every 3 weeks. First dose 08/15/2015. Discontinued secondary to intolerance.  CURRENT THERAPY: Immunotherapy with Nivolumab 240 MG IV every 2 weeks. First dose 01/18/2016. Status post 12 cycles.  INTERVAL HISTORY: BALEN MATHESON89y.o. male returns to the clinic today for follow-up visit accompanied by his daughter [name] The patient has no complaints today except for persistent mild cough and intermittent wheezing. He tolerated the last cycle of his immunotherapy with Nivolumab fairly well with no significant adverse effects. He denied having any significant skin rash. He denied having any nausea, vomiting, diarrhea or constipation. He continues to have shortness of breath only with exertion. He denied having any fever or chills. He has no nausea or vomiting. He had repeat CT scan of the chest performed recently and he is here for evaluation and discussion of his scan results.  MEDICAL HISTORY: Past Medical History:  Diagnosis Date  . Cancer ([place]    skin cancer  . Constipation    AFTER  ANESTHESIA  . Difficulty sleeping   . History of nonmelanoma skin cancer   . Inguinal hernia   . Lung mass    right / HAS HAD RADIATION AND CHEMO FOR LUNG CANCER  . Pneumothorax on right 08/24/2015  . Productive cough    "DUE TO RADIATION"  . Radiation 05/11/15-06/21/15   NSCLCA/right lung  . Spontaneous pneumothorax MANY YRS AGO AND AGAIN 04/04/15   HISTORY OF (ONLY)    ALLERGIES:  is allergic to aleve [naproxen].  MEDICATIONS:  Current Outpatient Prescriptions  Medication Sig Dispense Refill  . acetaminophen (TYLENOL) 325 MG tablet 2 tablets    . albuterol (PROVENTIL HFA) 108 (90 Base) MCG/ACT inhaler 1-2 puffs as needed    . ALPRAZolam (XANAX) 0.5 MG tablet 1 tablet    . Arginine (RA L-ARGININE) 1000 MG TABS Take 500 mg by mouth daily.     . Ascorbic Acid (VITAMIN C) 1000 MG tablet Take 1,000 mg by mouth daily. Pt takes 2 5056mtabs    . B Complex Vitamins (B COMPLEX PO) Take 1 capsule by mouth daily.     . budesonide-formoterol (SYMBICORT) 160-4.5 MCG/ACT inhaler 2 puffs BID 1 Inhaler 5  . CALCIUM CITRATE PO Take 1 tablet by mouth daily.    . Cyanocobalamin (VITAMIN B 12 PO) Take 1 tablet by mouth daily.    . Marland Kitchenocusate sodium (COLACE) 100 MG capsule 1 capsule as needed    . ferrous sulfate 325 (65 FE) MG tablet Take 325 mg by mouth daily with breakfast.    . guaiFENesin (MUCINEX) 600 MG 12 hr tablet Take 1 tablet (600 mg total)  by mouth 2 (two) times daily as needed.    . magnesium 30 MG tablet Take 30 mg by mouth daily.    . Magnesium Gluconate 550 MG TABS Take by mouth.    . Multiple Vitamin (MULTI VITAMIN DAILY PO) Take 1 tablet by mouth daily.    . Omega-3 Fatty Acids (FISH OIL) 1000 MG CAPS Take 1,000 mg by mouth daily.    Marland Kitchen omeprazole (PRILOSEC) 20 MG capsule   0  . oxyCODONE (OXY IR/ROXICODONE) 5 MG immediate release tablet 1 tablet    . saccharomyces boulardii (FLORASTOR) 250 MG capsule Take 250 mg by mouth 2 (two) times daily.    . vitamin A 10000 UNIT capsule 1 capsule  with food or milk    . Omeprazole (RA OMEPRAZOLE) 20 MG TBEC Take 20 mg by mouth.     No current facility-administered medications for this visit.     SURGICAL HISTORY:  Past Surgical History:  Procedure Laterality Date  . CHEST TUBE INSERTION Right 08/02/2015   Procedure: INSERTION OF SECOND RIGHT CHEST TUBE WITH FLUROSCOPY;  Surgeon: Grace Isaac, MD;  Location: [facility];  Service: Thoracic;  Laterality: Right;  . CHEST TUBE INSERTION  07/28/2015  . COLONOSCOPY W/ BIOPSIES AND POLYPECTOMY    . HERNIA REPAIR  2011  . INGUINAL HERNIA REPAIR Left 07/15/2015   Procedure: OPEN LEFT INGUINAL HERNIA REPAIR;  Surgeon: Johnathan Hausen, MD;  [facility];  Service: General;  Laterality: Left;  With MESH  . TONSILLECTOMY    . VIDEO BRONCHOSCOPY WITH ENDOBRONCHIAL ULTRASOUND N/A 04/18/2015   Procedure: VIDEO BRONCHOSCOPY WITH ENDOBRONCHIAL ULTRASOUND;  Surgeon: Grace Isaac, MD;  Location: [facility];  Service: Thoracic;  Laterality: N/A;  . VIDEO BRONCHOSCOPY WITH INSERTION OF INTERBRONCHIAL VALVE (IBV) N/A 08/02/2015   Procedure: VIDEO BRONCHOSCOPY WITH ATTEMPTED INSERTION OF INTERBRONCHIAL VALVE (IBV) WITH FLUROSCOPY;  Surgeon: Grace Isaac, MD;  Location[facility];  Service: Thoracic;  Laterality: N/A;  . WISDOM TOOTH EXTRACTION      REVIEW OF SYSTEMS:  Constitutional: negative Eyes: negative Ears, nose, mouth, throat, and face: negative Respiratory: positive for dyspnea on exertion and wheezing Cardiovascular: negative Gastrointestinal: negative Genitourinary:negative Integument/breast: negative Hematologic/lymphatic: negative Musculoskeletal:negative Neurological: negative Behavioral/Psych: negative Endocrine: negative Allergic/Immunologic: negative   PHYSICAL EXAMINATION: General appearance: alert, cooperative and no distress Head: Normocephalic, without obvious abnormality, atraumatic Neck: no adenopathy, no JVD, supple, symmetrical, trachea midline and thyroid not enlarged,  symmetric, no tenderness/mass/nodules Lymph nodes: Cervical, supraclavicular, and axillary nodes normal. Resp: clear to auscultation bilaterally Back: symmetric, no curvature. ROM normal. No CVA tenderness. Cardio: regular rate and rhythm, S1, S2 normal, no murmur, click, rub or gallop GI: soft, non-tender; bowel sounds normal; no masses,  no organomegaly Extremities: extremities normal, atraumatic, no cyanosis or edema Neurologic: Alert and oriented X 3, normal strength and tone. Normal symmetric reflexes. Normal coordination and gait  ECOG PERFORMANCE STATUS: 1 - Symptomatic but completely ambulatory  Blood pressure (!) 166/79, pulse 92, temperature 97.7 F (36.5 C), temperature source Oral, resp. rate 18, height _0  (1.778 m), weight 127 lb 8 oz (57.8 kg), SpO2 98 %.  LABORATORY DATA: Lab Results  Component Value Date   WBC 10.2 07/09/2016   HGB 14.2 07/09/2016   HCT 42.8 07/09/2016   MCV 90.5 07/09/2016   PLT 212 07/09/2016      Chemistry      Component Value Date/Time   NA 142 06/25/2016 1126   K 4.1 06/25/2016 1126   CL 102  09/12/2015 0410   CO2 25 06/25/2016 1126   BUN 27.2 (H) 06/25/2016 1126   CREATININE 1.1 06/25/2016 1126      Component Value Date/Time   CALCIUM 10.1 06/25/2016 1126   ALKPHOS 102 06/25/2016 1126   AST 19 06/25/2016 1126   ALT 17 06/25/2016 1126   BILITOT <0.30 06/25/2016 1126       RADIOGRAPHIC STUDIES: Ct Chest W Contrast  Result Date: 07/07/2016 CLINICAL DATA:  Non-small-cell carcinoma of right lung, stage III. EXAM: CT CHEST WITH CONTRAST TECHNIQUE: Multidetector CT imaging of the chest was performed during intravenous contrast administration. CONTRAST:  28m ISOVUE-300 IOPAMIDOL (ISOVUE-300) INJECTION 61% COMPARISON:  CT scan of May 10, 2016. FINDINGS: Cardiovascular: Atherosclerosis of thoracic aorta is noted without aneurysm or dissection. Normal cardiac size. Mediastinum/Nodes: Stable right peritracheal adenopathy is noted with  largest lymph node measuring 9 mm. Stable right axillary adenopathy is noted with largest lymph node measuring 6 mm. Lungs/Pleura: No pneumothorax is noted. Extensive postsurgical changes are noted in right upper lobe consistent with prior lobectomy. Mediastinal shift to the right is noted with hyperexpansion of the left lung. Air bronchograms are noted in the right upper lobe which are stable. Pleural thickening is noted in the inferior portion of the right major fissure with probable scarring seen in the right lung base. This is not significantly changed. Stable pleural-based part solid density is seen anteriorly in left lung apex measuring 14 x 10 mm best seen on image number 16 of series 5. Stable sub solid nodular density is noted posteriorly in left upper lobe best seen on image number 39 of series 5. Stable bulla formation is noted in superior segment of left lower lobe. Stable sub solid density measuring 22 x 19 mm is noted in left lung base best seen on image number 126 of series 5. 6 mm solid nodule is noted in superior segment of left lower lobe best seen on image number 56 of series 5 which is stable compared to prior exam. Upper Abdomen: No definite abnormality seen. 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 IMPRESSION: Stable postsurgical changes involving right upper lobe with mediastinal shift to the right. Stable consolidation is noted in the right upper lobe with air bronchograms. Stable right peritracheal and axillary adenopathy is noted. Stable pleural thickening and scarring is noted in the right lung. Stable 6 mm solid nodule is noted in superior segment of left lower lobe. Stable multiple part solid and sub solid nodules are noted in the left lung as described above. Electronically Signed   By: JMarijo Conception M.D.   On: 07/07/2016 07:56    ASSESSMENT AND PLAN: This is a very pleasant 75years old white male with stage IIIA non-small cell  lung cancer. He is status post a course of concurrent chemoradiation with weekly carboplatin and paclitaxel with partial response. This was followed by 1 cycle of consolidation chemotherapy with carboplatin and gemcitabine but this was discontinued secondary to right pneumothorax and frequent hospitalization. The patient has been on observation for the last few months. The recent CT scan of the chest showed improvement of the right hemithorax but there was enlarging subsided and new/enlarging soft tissue nodule in the left lung suspicious for metastatic disease. The patient is currently undergoing treatment with immunotherapy with Nivolumab status post 12 cycles. He tolerated his last treatment fairly well. The recent CT scan of the chest showed no evidence for disease progression. I discussed the scan results with  the patient and his daughter. I recommended for him to continue his treatment with immunotherapy and he will proceed with cycle #13 today. For pain management, he will continue on his current pain medication. For the mild anemia, he will taking over-the-counter iron tablets 1-2 tablets every day with vitamin C or orange juice. The patient would come back for follow-up visit in 2 weeks for reevaluation with repeat blood work before starting cycle #14. The patient was advised to call immediately if he has any concerning symptoms in the interval. He was advised to call immediately if he has any concerning symptoms in the interval. The patient voices understanding of current disease status and treatment options and is in agreement with the current care plan.  All questions were answered. The patient knows to call the clinic with any problems, questions or concerns. We can certainly see the patient much sooner if necessary.  Disclaimer: This note was dictated with voice recognition software. Similar sounding words can inadvertently be transcribed and may not be corrected upon review.

## 2016-07-09 NOTE — Patient Instructions (Signed)
[facility] Discharge Instructions for Patients Receiving Chemotherapy  Today you received the following chemotherapy agents:  Nivolumab.  To help prevent nausea and vomiting after your treatment, we encourage you to take your nausea medication as directed.   If you develop nausea and vomiting that is not controlled by your nausea medication, call the clinic.   BELOW ARE SYMPTOMS THAT SHOULD BE REPORTED IMMEDIATELY:  *FEVER GREATER THAN 100.5 F  *CHILLS WITH OR WITHOUT FEVER  NAUSEA AND VOMITING THAT IS NOT CONTROLLED WITH YOUR NAUSEA MEDICATION  *UNUSUAL SHORTNESS OF BREATH  *UNUSUAL BRUISING OR BLEEDING  TENDERNESS IN MOUTH AND THROAT WITH OR WITHOUT PRESENCE OF ULCERS  *URINARY PROBLEMS  *BOWEL PROBLEMS  UNUSUAL RASH Items with * indicate a potential emergency and should be followed up as soon as possible.  Feel free to call the clinic you have any questions or concerns. The clinic phone number is [phone].  Please show the CHEMO ALERT CARD at check-in to the Emergency Department and triage nurse.   

## 2016-07-11 ENCOUNTER — Telehealth: Payer: Self-pay | Admitting: Internal Medicine

## 2016-07-11 NOTE — Telephone Encounter (Signed)
Called patient to confirm appointments per 06/25/16 los. L/M Appt ltr and appt schd mailed . 07/11/16

## 2016-07-12 ENCOUNTER — Encounter: Payer: Self-pay | Admitting: Cardiothoracic Surgery

## 2016-07-12 ENCOUNTER — Ambulatory Visit (INDEPENDENT_AMBULATORY_CARE_PROVIDER_SITE_OTHER): Payer: 59 | Admitting: Cardiothoracic Surgery

## 2016-07-12 VITALS — BP 145/85 | HR 101 | Resp 18 | Ht 70.0 in | Wt 127.5 lb

## 2016-07-12 DIAGNOSIS — Z5189 Encounter for other specified aftercare: Secondary | ICD-10-CM

## 2016-07-12 DIAGNOSIS — C3491 Malignant neoplasm of unspecified part of right bronchus or lung: Secondary | ICD-10-CM

## 2016-07-12 DIAGNOSIS — Z923 Personal history of irradiation: Secondary | ICD-10-CM

## 2016-07-12 DIAGNOSIS — Z9221 Personal history of antineoplastic chemotherapy: Secondary | ICD-10-CM

## 2016-07-12 NOTE — Progress Notes (Signed)
[address]       [place] [address]             [phone]      Damacio H Bartek [facility] Medical Record #[id number] Date of Birth: May 15, 1941  Referring: Curt Bears, MD Primary Care: Cammy Copa, MD  Chief Complaint:   Chest tube placement Non-small cell carcinoma of right lung, stage 3 [facility])   Staging form: Lung, AJCC 7th Edition     Clinical stage from 04/21/2015: Stage IIIA (T2a, N2, M0) - Signed by Curt Bears, MD on 04/21/2015  Foundation One molecular studies reveal that he was positive for [place], [place], and STAG2 [id number]. He was negative for: EGFR, ALK, BRAF, MET, RET, ERBB2 and ROS1  PRIOR THERAPY:  1) Concurrent chemoradiation with chemotherapy in the form of weekly carboplatin for AUC 2 paclitaxel 45 mg/m given concurrent with radiation therapy. First cycle started 05/09/2015. Status post 7 cycles. 2) Consolidation chemotherapy with carboplatin for AUC of 5 on day 1 and gemcitabine 1000 MG/M2 on days 1 and 8 every 3 weeks. First dose 08/15/2015. Discontinued secondary to intolerance.  CURRENT THERAPY: Immunotherapy with Nivolumab 240 MG IV every 2 weeks. First dose 01/18/2016.  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 Since seen 3 months ago the patient continues to improve. 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  He still complains of fatigue but overall is in much higher functional level than has been for months.  Past Medical History:  Diagnosis Date  . [place]    [place] cancer  . Constipation    AFTER ANESTHESIA  . Difficulty sleeping   . History of nonmelanoma skin cancer   . Inguinal hernia   . Lung mass    right / HAS HAD RADIATION AND CHEMO FOR LUNG CANCER  . Pneumothorax on right  08/24/2015  . Productive cough    "DUE TO RADIATION"  . Radiation 05/11/15-06/21/15   NSCLCA/right lung  . Spontaneous pneumothorax MANY YRS AGO AND AGAIN 04/04/15   HISTORY OF (ONLY)     History  Smoking Status  . Current Every Day Smoker  . Packs/day: 0.50  . Years: 55.00  . Types: Cigarettes  Smokeless Tobacco  . Never Used    Comment: 1/2 ppd 05/15/16    History  Alcohol Use  . 0.0 oz/week    Comment: every other night -social     Allergies  Allergen Reactions  . Aleve [Naproxen] Rash    Current Outpatient Prescriptions  Medication Sig Dispense Refill  . acetaminophen (TYLENOL) 325 MG tablet 2 tablets    . albuterol (PROVENTIL HFA) 108 (90 Base) MCG/ACT inhaler 1-2 puffs as needed    . ALPRAZolam (XANAX) 0.5 MG tablet 1 tablet    . Arginine (RA L-ARGININE) 1000 MG TABS Take 500 mg by mouth daily.     . Ascorbic Acid (VITAMIN C) 1000 MG tablet Take 1,000 mg by mouth daily. Pt takes 2 5055mtabs    . B Complex Vitamins (B COMPLEX PO) Take 1 capsule by mouth daily.     . budesonide-formoterol (SYMBICORT) 160-4.5 MCG/ACT inhaler 2 puffs BID 1 Inhaler 5  . CALCIUM CITRATE PO Take 1 tablet by mouth daily.    . Cyanocobalamin (VITAMIN B 12 PO) Take  1 tablet by mouth daily.    Marland Kitchen docusate sodium (COLACE) 100 MG capsule 1 capsule as needed    . ferrous sulfate 325 (65 FE) MG tablet Take 325 mg by mouth daily with breakfast.    . guaiFENesin (MUCINEX) 600 MG 12 hr tablet Take 1 tablet (600 mg total) by mouth 2 (two) times daily as needed.    . magnesium 30 MG tablet Take 30 mg by mouth daily.    . Magnesium Gluconate 550 MG TABS Take by mouth.    . Multiple Vitamin (MULTI VITAMIN DAILY PO) Take 1 tablet by mouth daily.    . Omega-3 Fatty Acids (FISH OIL) 1000 MG CAPS Take 1,000 mg by mouth daily.    Marland Kitchen omeprazole (PRILOSEC) 20 MG capsule   0  . Omeprazole (RA OMEPRAZOLE) 20 MG TBEC Take 20 mg by mouth.    . oxyCODONE (OXY IR/ROXICODONE) 5 MG immediate release tablet 1 tablet      . saccharomyces boulardii (FLORASTOR) 250 MG capsule Take 250 mg by mouth 2 (two) times daily.    . vitamin A 10000 UNIT capsule 1 capsule with food or milk     No current facility-administered medications for this visit.      Wt Readings from Last 3 Encounters:  07/12/16 127 lb 8 oz (57.8 kg)  07/09/16 127 lb 8 oz (57.8 kg)  06/25/16 127 lb 1.6 oz (57.7 kg)    Physical Exam: BP (!) 145/85   Pulse (!) 101   Resp 18   Ht 5' 10"  (1.778 m)   Wt 127 lb 8 oz (57.8 kg)   SpO2 98% Comment: ON RA  BMI 18.29 kg/m   General appearance: alert and cooperative Neurologic: intact Heart: regular rate and rhythm, S1, S2 normal, no murmur, click, rub or gallop Lungs: diminished breath sounds Right upper lobe Abdomen: soft, non-tender; bowel sounds normal; no masses,  no organomegaly Extremities: extremities normal, atraumatic, no cyanosis or edema and Homans sign is negative, no sign of DVT Wound: Chest tube site is completely healed There is no cervical or supraclavicular adenopathy   Diagnostic Studies & Laboratory data:     Recent Radiology Findings: Ct Chest W Contrast  Result Date: 07/07/2016 CLINICAL DATA:  Non-small-cell carcinoma of right lung, stage III. EXAM: CT CHEST WITH CONTRAST TECHNIQUE: Multidetector CT imaging of the chest was performed during intravenous contrast administration. CONTRAST:  54m ISOVUE-300 IOPAMIDOL (ISOVUE-300) INJECTION 61% COMPARISON:  CT scan of May 10, 2016. FINDINGS: Cardiovascular: Atherosclerosis of thoracic aorta is noted without aneurysm or dissection. Normal cardiac size. Mediastinum/Nodes: Stable right peritracheal adenopathy is noted with largest lymph node measuring 9 mm. Stable right axillary adenopathy is noted with largest lymph node measuring 6 mm. Lungs/Pleura: No pneumothorax is noted. Extensive postsurgical changes are noted in right upper lobe consistent with prior lobectomy. Mediastinal shift to the right is noted with hyperexpansion of  the left lung. Air bronchograms are noted in the right upper lobe which are stable. Pleural thickening is noted in the inferior portion of the right major fissure with probable scarring seen in the right lung base. This is not significantly changed. Stable pleural-based part solid density is seen anteriorly in left lung apex measuring 14 x 10 mm best seen on image number 16 of series 5. Stable sub solid nodular density is noted posteriorly in left upper lobe best seen on image number 39 of series 5. Stable bulla formation is noted in superior segment of left lower lobe. Stable sub  solid density measuring 22 x 19 mm is noted in left lung base best seen on image number 126 of series 5. 6 mm solid nodule is noted in superior segment of left lower lobe best seen on image number 56 of series 5 which is stable compared to prior exam. Upper Abdomen: No definite abnormality seen. 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 IMPRESSION: Stable postsurgical changes involving right upper lobe with mediastinal shift to the right. Stable consolidation is noted in the right upper lobe with air bronchograms. Stable right peritracheal and axillary adenopathy is noted. Stable pleural thickening and scarring is noted in the right lung. Stable 6 mm solid nodule is noted in superior segment of left lower lobe. Stable multiple part solid and sub solid nodules are noted in the left lung as described above. Electronically Signed   By: Marijo Conception, M.D.   On: 07/07/2016 07:56    Ct Chest W ContrastCt Abdomen Pelvis W Contrast 03/12/2016  CLINICAL DATA:  Stage III lung cancer. EXAM: CT CHEST, ABDOMEN, AND PELVIS WITH CONTRAST TECHNIQUE: Multidetector CT imaging of the chest, abdomen and pelvis was performed following the standard protocol during bolus administration of intravenous contrast. CONTRAST:  161m ISOVUE-300 IOPAMIDOL (ISOVUE-300) INJECTION 61% COMPARISON:  Chest CT from  01/03/2016. FINDINGS: CT CHEST FINDINGS Mediastinum/Lymph Nodes: There is no axillary lymphadenopathy. Small right paratracheal lymph nodes are stable as is the small subcarinal lymph node. Abnormal soft tissue attenuation in the right hilum and right infrahilar region, encasing the right lower lobe bronchus is unchanged. The heart size is normal. No pericardial effusion. Coronary artery calcification is noted. The esophagus has normal imaging features. Lungs/Pleura: Volume loss in the right hemi thorax is again noted. Air-fluid level seen in the right apex on the previous study has resolved in the interval. The right upper lobe collapse/consolidation with bronchiectasis is again noted, without substantial change. Continued further decrease in right pleural effusion evident. Centrilobular emphysema noted left upper lobe. Sub solid left lower lobe pulmonary nodule measured previously 2.0 x 2.3 cm now measures 1.9 x 2.2 cm (image 125 series 4). 12 mm adjacent subpleural left lower lobe nodule seen on the previous study has decreased to 7 mm in the interval (image 128). Cavitary sub solid nodule in the superior segment left lower lobe measured previously 2.9 x 2.5 cm is stable at 2.8 x 2.5 cm (image 73). 7 mm nodule seen more cephalad in the left lower lobe on the prior study measures 6 mm today (image 57 ). Ill-defined sub solid nodule in the posterior left upper lobe measured previously at 12 x 25 mm now measures 16 x 23 mm. 15 mm area of anterior left apical scarring/nodularity on the prior study is now 16 mm (image 16). Musculoskeletal: Bone windows reveal no worrisome lytic or sclerotic osseous lesions. CT ABDOMEN PELVIS FINDINGS Hepatobiliary: No focal abnormality within the liver parenchyma. There is no evidence for gallstones, gallbladder wall thickening, or pericholecystic fluid. 8 mm common bile duct diameter is upper normal for age. Pancreas: Slight prominence of the main pancreatic duct without pancreatic  head lesion. Dystrophic calcification in the tail the pancreas may be from prior inflammation. Spleen: No splenomegaly. No focal mass lesion. Adrenals/Urinary Tract: 1.9 cm low-density lesion in the region the right adrenal gland is unchanged in the interval. Left adrenal gland is normal. No evidence for enhancing lesion in either kidney. There is no hydronephrosis. No evidence for hydroureter. Urinary bladder is normal in  appearance. Stomach/Bowel: Stomach is nondistended. No gastric wall thickening. No evidence of outlet obstruction. Duodenum is normally positioned as is the ligament of Treitz. No small bowel wall thickening. No small bowel dilatation. The terminal ileum is normal. Diverticular changes are noted in the left colon without evidence of diverticulitis. Vascular/Lymphatic: There is abdominal aortic atherosclerosis without aneurysm. There is no gastrohepatic or hepatoduodenal ligament lymphadenopathy. No intraperitoneal or retroperitoneal lymphadenopathy. No pelvic sidewall lymphadenopathy. Reproductive: Prostate gland is enlarged. Other: No intraperitoneal free fluid. Musculoskeletal: Bone windows reveal no worrisome lytic or sclerotic osseous lesions. IMPRESSION: 1. Interval resolution of tiny right apical gas collection with stable collapse/consolidation with bronchiectasis in the right upper lobe. 2. Stable appearance of residual soft tissue fullness around the right lower lobe bronchus. 3. Interval decrease in right pleural effusion. 4. No substantial change in the multiple wall solid and sub solid left lung nodules. 5. Emphysema. 6. Abdominal aortic atherosclerosis. 7. Stable appearance of low-density lesion adjacent to the upper pole the right kidney. Electronically Signed   By: Misty Stanley M.D.   On: 03/12/2016 17:28   Ct Chest W Contrast  01/03/2016  CLINICAL DATA:  Restaging non-small cell carcinoma of right lung. Diagnosed 6/16. Chemotherapy and radiation therapy 8/16. Cough. Shortness of  breath on exertion. EXAM: CT CHEST WITH CONTRAST TECHNIQUE: Multidetector CT imaging of the chest was performed during intravenous contrast administration. CONTRAST:  60m OMNIPAQUE IOHEXOL 300 MG/ML  SOLN COMPARISON:  Plain film 11/03/2015.  Most recent CT of 09/30/2015. FINDINGS: Mediastinum/Nodes: No supraclavicular adenopathy. Mild left-sided gynecomastia. Normal heart size with lipomatous hypertrophy of the interatrial septum. Multivessel coronary artery atherosclerosis. No central pulmonary embolism, on this non-dedicated study. No well-defined mediastinal adenopathy. Lungs/Pleura: Right-sided pleural fluid is decreased, tiny. Residual right-sided pleural thickening. Decreased in right apical pneumothorax. Probable secretions in the right mainstem bronchus. Moderate centrilobular emphysema. Persistent right apical and upper lobe dense consolidation with improvement in lateral areas of bronchiectasis. Improvement in right lung base aeration and septal thickening. Residual soft tissue thickening about the origin of the right upper lobe bronchus, which may represent residual neoplasia. Example image 32/ series 5. Sub solid left lower lobe pulmonary nodule measures 2.0 x 2.3 cm on image 53/series 5. Compare 1.6 x 1.5 cm on the prior exam. Development of a pleural-based adjacent left lower lobe pulmonary nodule which measures 12 mm on image 54/series 5. Is also present on coronal image 31. Increased definition of the superior segment left lower lobe partially cavitary sub solid pulmonary nodule. Example 2.9 x 2.5 cm on image 32/series 5. Compare 3.1 x 2.4 cm on the prior. Soft tissue nodule more cephalad in the left lower lobe measures 7 mm on image 25/ series 5 and is new. Posterior left upper lobe sub solid pulmonary nodule measures 2.5 x 1.2 cm on image 17/series 5 and is similar. Scarring versus sub solid pulmonary nodule on the anterior left apex at 1.5 cm on image 8/series 5. 1.3 cm on the prior. Upper  abdomen: Old granulomatous disease in the liver. Normal imaged portions of the spleen, stomach, left adrenal gland, and left kidney. Upper pole right renal collecting system calculus. Right suprarenal nodule measures 1.9 cm on image 60/series 2 and is felt to be similar to on the prior exam. This is present back to 04/12/2015 PET, and not hypermetabolic on that study. Musculoskeletal: Healing fracture of the fourth antrolateral right rib on image 33/ series 2. New since the prior. No underlying osseous metastasis on the prior exam. IMPRESSION:  1. Overall improved appearance of the right hemi thorax. Although there is residual consolidation and bronchiectasis in the right apex and upper lobe, the right lower lobe demonstrates improved aeration with decreased adjacent pleural fluid and thickening. 2. Residual soft tissue fullness about the origin of the right lower lobe bronchus, suspicious for residual neoplasia. 3. Enlarging sub solid and new/enlarging soft tissue nodules in the left lung, suspicious for metastatic disease. 4.  Atherosclerosis, including within the coronary arteries. 5. 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 6. 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 7. New right fourth rib healing fracture. Favored to be posttraumatic. Correlate with interval probable. Electronically Signed   By: Abigail Miyamoto M.D.   On: 01/03/2016 15:03    Ct Chest Wo Contrast  09/30/2015  CLINICAL DATA:  75 year old male with history of pneumothorax. Followup after chest tube removal. 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 EXAM: CT CHEST WITHOUT CONTRAST TECHNIQUE: Multidetector CT imaging of the chest was performed following the standard protocol without IV contrast. COMPARISON:  Chest CT 08/30/2015. FINDINGS: Mediastinum/Lymph Nodes: Heart size is normal. Small amount of pericardial fluid  and/or thickening, unlikely to be of any hemodynamic significance at this time. No associated pericardial calcification. There is atherosclerosis of the thoracic aorta, the great vessels of the mediastinum and the coronary arteries, including calcified atherosclerotic plaque in the left main, left anterior descending, left circumflex and right coronary arteries. Calcifications of the aortic valve. Multiple borderline enlarged and mildly enlarged mediastinal lymph nodes are again noted, measuring up to 1.2 cm in short axis in the right paratracheal nodal station. There is also soft tissue fullness in the right hilar region, suggesting lymphadenopathy (poorly evaluated on today's noncontrast CT examination). Esophagus is unremarkable in appearance. No axillary lymphadenopathy. Lungs/Pleura: When compared to remote prior examination 07/22/2015, there is evidence of severe chronic volume loss in the right hemithorax, which is notably smaller than the contralateral side. There is chronic collapse and consolidation of much of the right lung, most severe in the right upper lobe, where there also appears to be extensive varicose and cystic bronchiectasis. Cavity in the apex of the right lung appears to communicate with a thick walled cavity in the apex of the right pleural space, best appreciated on image 13 of series 3. This thick-walled cavity in the right pleural space appears contiguous with a tract along the anterior aspect of the right pleural space, extending into the lower right hemithorax, which likely represents the old chest tube tract. Other than this small amount of pleural gas, there is no significant residual pneumothorax. Small chronic thick-walled right pleural effusion posteriorly. Previously demonstrated right middle and lower lobe lesions are now obscured, but as mentioned above, there is extensive soft tissue fullness in the right hilar region, and in the infrahilar aspect of the right lower lobe, which  suggests residual neoplasm and/or adenopathy. Again noted is a large mass-like area of ground-glass attenuation, septal thickening and cystic change in the posterior aspect of the superior segment of the left lower lobe (image 30 of series 3), which appears larger than prior examinations, currently measuring 3.1 x 2.4 cm. There is also a irregular ground-glass attenuation lesion with septal thickening in the periphery of the left upper lobe (image 16 of series 3) which measures 2.4 x 1.2 cm, and an irregular sub solid lesion with spiculated margins in the inferior aspect  of the left lower lobe (image 52 of series 3) which measures 15 x 16 mm. Upper Abdomen: 3 mm calcification in the interpolar region of the right kidney may be vascular, or may be a nonobstructive calculus. Musculoskeletal/Soft Tissues: There are no aggressive appearing lytic or blastic lesions noted in the visualized portions of the skeleton. IMPRESSION: 1. Right-sided fibrothorax. 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 2. Worsening chronic consolidation and collapse in the right lung, with extensive nodular septal thickening, and persistent right infrahilar fullness in the central aspect of the right lower lobe, right hilar fullness and mediastinal adenopathy, as above, suggesting residual neoplasm with right hilar and mediastinal lymphadenopathy. 3. Enlarging sub solid lesions throughout the left lung, as above, concerning for multicentric adenocarcinoma. 4. Moderate centrilobular and paraseptal emphysema. 5. Possible 3 mm nonobstructive calculus in the interpolar collecting system of the right kidney. 6. Atherosclerosis, including left main and 3 vessel coronary artery disease. Electronically Signed   By: Vinnie Langton M.D.   On:  09/30/2015 11:16         Recent Lab Findings: Lab Results  Component Value Date   WBC 10.2 07/09/2016   HGB 14.2 07/09/2016   HCT 42.8 07/09/2016   PLT 212 07/09/2016   GLUCOSE 90 07/09/2016   ALT 20 07/09/2016   AST 22 07/09/2016   NA 141 07/09/2016   K 3.8 07/09/2016   CL 102 09/12/2015   CREATININE 1.1 07/09/2016   BUN 21.4 07/09/2016   CO2 26 07/09/2016   TSH 0.669 06/25/2016   INR 1.10 08/01/2015      Assessment / Plan:  Patient with extensive carcinoma the lung currently stable if not improving functionally, with stable findings on recent CT scan under current treatment regimen. Overall seen his functional status improved and knowing what his condition was 8-12 months ago he is much improved. He will return to see me after his next CT scan in 3 months.  [name]      [address] [place] [address] Office [phone]   Beeper [phone]  07/12/2016 1:58 PM

## 2016-07-23 ENCOUNTER — Ambulatory Visit (HOSPITAL_BASED_OUTPATIENT_CLINIC_OR_DEPARTMENT_OTHER): Payer: 59 | Admitting: Internal Medicine

## 2016-07-23 ENCOUNTER — Ambulatory Visit (HOSPITAL_BASED_OUTPATIENT_CLINIC_OR_DEPARTMENT_OTHER): Payer: 59

## 2016-07-23 ENCOUNTER — Other Ambulatory Visit (HOSPITAL_BASED_OUTPATIENT_CLINIC_OR_DEPARTMENT_OTHER): Payer: 59

## 2016-07-23 ENCOUNTER — Encounter: Payer: Self-pay | Admitting: Internal Medicine

## 2016-07-23 VITALS — BP 150/76 | HR 102 | Resp 18 | Ht 70.0 in | Wt 127.1 lb

## 2016-07-23 DIAGNOSIS — R530 Neoplastic (malignant) related fatigue: Secondary | ICD-10-CM

## 2016-07-23 DIAGNOSIS — D649 Anemia, unspecified: Secondary | ICD-10-CM | POA: Diagnosis not present

## 2016-07-23 DIAGNOSIS — C3491 Malignant neoplasm of unspecified part of right bronchus or lung: Secondary | ICD-10-CM

## 2016-07-23 DIAGNOSIS — C3411 Malignant neoplasm of upper lobe, right bronchus or lung: Secondary | ICD-10-CM

## 2016-07-23 DIAGNOSIS — Z5112 Encounter for antineoplastic immunotherapy: Secondary | ICD-10-CM

## 2016-07-23 DIAGNOSIS — R52 Pain, unspecified: Secondary | ICD-10-CM | POA: Diagnosis not present

## 2016-07-23 DIAGNOSIS — R05 Cough: Secondary | ICD-10-CM | POA: Diagnosis not present

## 2016-07-23 LAB — CBC WITH DIFFERENTIAL/PLATELET
BASO%: 1 % (ref 0.0–2.0)
BASOS ABS: 0.1 10*3/uL (ref 0.0–0.1)
EOS ABS: 0.1 10*3/uL (ref 0.0–0.5)
EOS%: 1.4 % (ref 0.0–7.0)
HCT: 46.7 % (ref 38.4–49.9)
HGB: 15.2 g/dL (ref 13.0–17.1)
LYMPH%: 9 % — AB (ref 14.0–49.0)
MCH: 29.4 pg (ref 27.2–33.4)
MCHC: 32.5 g/dL (ref 32.0–36.0)
MCV: 90.3 fL (ref 79.3–98.0)
MONO#: 0.7 10*3/uL (ref 0.1–0.9)
MONO%: 6.7 % (ref 0.0–14.0)
NEUT%: 81.9 % — AB (ref 39.0–75.0)
NEUTROS ABS: 8.7 10*3/uL — AB (ref 1.5–6.5)
PLATELETS: 236 10*3/uL (ref 140–400)
RBC: 5.17 10*6/uL (ref 4.20–5.82)
RDW: 14.5 % (ref 11.0–14.6)
WBC: 10.6 10*3/uL — ABNORMAL HIGH (ref 4.0–10.3)
lymph#: 1 10*3/uL (ref 0.9–3.3)

## 2016-07-23 LAB — COMPREHENSIVE METABOLIC PANEL
ALT: 21 U/L (ref 0–55)
ANION GAP: 10 meq/L (ref 3–11)
AST: 22 U/L (ref 5–34)
Albumin: 3.8 g/dL (ref 3.5–5.0)
Alkaline Phosphatase: 117 U/L (ref 40–150)
BILIRUBIN TOTAL: 0.69 mg/dL (ref 0.20–1.20)
BUN: 20.8 mg/dL (ref 7.0–26.0)
CO2: 24 meq/L (ref 22–29)
Calcium: 10.3 mg/dL (ref 8.4–10.4)
Chloride: 106 mEq/L (ref 98–109)
Creatinine: 1.1 mg/dL (ref 0.7–1.3)
EGFR: 63 mL/min/{1.73_m2} — AB (ref >90)
Glucose: 80 mg/dl (ref 70–140)
Potassium: 4 mEq/L (ref 3.5–5.1)
Sodium: 141 mEq/L (ref 136–145)
TOTAL PROTEIN: 8.3 g/dL (ref 6.4–8.3)

## 2016-07-23 MED ORDER — SODIUM CHLORIDE 0.9 % IV SOLN
Freq: Once | INTRAVENOUS | Status: AC
  Administered 2016-07-23: 15:00:00 via INTRAVENOUS

## 2016-07-23 MED ORDER — SODIUM CHLORIDE 0.9 % IV SOLN
240.0000 mg | Freq: Once | INTRAVENOUS | Status: AC
  Administered 2016-07-23: 240 mg via INTRAVENOUS
  Filled 2016-07-23: qty 4

## 2016-07-23 NOTE — Patient Instructions (Signed)
[facility] Discharge Instructions for Patients Receiving Chemotherapy  Today you received the following chemotherapy agents:  Nivolumab.  To help prevent nausea and vomiting after your treatment, we encourage you to take your nausea medication as directed.   If you develop nausea and vomiting that is not controlled by your nausea medication, call the clinic.   BELOW ARE SYMPTOMS THAT SHOULD BE REPORTED IMMEDIATELY:  *FEVER GREATER THAN 100.5 F  *CHILLS WITH OR WITHOUT FEVER  NAUSEA AND VOMITING THAT IS NOT CONTROLLED WITH YOUR NAUSEA MEDICATION  *UNUSUAL SHORTNESS OF BREATH  *UNUSUAL BRUISING OR BLEEDING  TENDERNESS IN MOUTH AND THROAT WITH OR WITHOUT PRESENCE OF ULCERS  *URINARY PROBLEMS  *BOWEL PROBLEMS  UNUSUAL RASH Items with * indicate a potential emergency and should be followed up as soon as possible.  Feel free to call the clinic you have any questions or concerns. The clinic phone number is [phone].  Please show the CHEMO ALERT CARD at check-in to the Emergency Department and triage nurse.   

## 2016-07-23 NOTE — Progress Notes (Signed)
[place] [phone]   [phone]  OFFICE PROGRESS NOTE  Cammy Copa, MD [phone] [address] [place] [address]  DIAGNOSIS: Non-small cell carcinoma of right lung, Adenocarcinoma, stage 3  Staging form: Lung, AJCC 7th Edition  Clinical stage from 04/21/2015: Stage IIIA (T2a, N2, M0) - Signed by Curt Bears, MD on 04/21/2015  Foundation One molecular studies reveal that he was positive for [place], [place], and STAG2 S1038f*20. He was negative for: EGFR, ALK, BRAF, MET, RET, ERBB2 and ROS1  PRIOR THERAPY:  1) Concurrent chemoradiation with chemotherapy in the form of weekly carboplatin for AUC 2 paclitaxel 45 mg/m given concurrent with radiation therapy. First cycle started 05/09/2015. Status post 7 cycles. 2) Consolidation chemotherapy with carboplatin for AUC of 5 on day 1 and gemcitabine 1000 MG/M2 on days 1 and 8 every 3 weeks. First dose 08/15/2015. Discontinued secondary to intolerance.  CURRENT THERAPY: Immunotherapy with Nivolumab 240 MG IV every 2 weeks. First dose 01/18/2016. Status post 13 cycles.  INTERVAL HISTORY: Gabriel KANOUSE89y.o. male returns to the clinic today for follow-up visit accompanied by his daughter [name] The patient has no complaints today except for persistent mild cough, chest congestion and flulike symptoms recently. He tolerated the last cycle of his immunotherapy with Nivolumab fairly well with no significant adverse effects. He denied having any significant skin rash. He denied having any nausea, vomiting, diarrhea or constipation. He continues to have shortness of breath only with exertion. He denied having any fever or chills. He has no nausea or vomiting. He is here today to start cycle #14 of his treatment.  MEDICAL HISTORY: Past Medical History:  Diagnosis Date  . [place]    skin cancer  . Constipation    AFTER ANESTHESIA  . Difficulty sleeping   .  History of nonmelanoma skin cancer   . Inguinal hernia   . Lung mass    right / HAS HAD RADIATION AND CHEMO FOR LUNG CANCER  . Pneumothorax on right 08/24/2015  . Productive cough    "DUE TO RADIATION"  . Radiation 05/11/15-06/21/15   NSCLCA/right lung  . Spontaneous pneumothorax MANY YRS AGO AND AGAIN 04/04/15   HISTORY OF (ONLY)    ALLERGIES:  is allergic to aleve [naproxen].  MEDICATIONS:  Current Outpatient Prescriptions  Medication Sig Dispense Refill  . acetaminophen (TYLENOL) 325 MG tablet 2 tablets    . albuterol (PROVENTIL HFA) 108 (90 Base) MCG/ACT inhaler 1-2 puffs as needed    . ALPRAZolam (XANAX) 0.5 MG tablet 1 tablet    . Arginine (RA L-ARGININE) 1000 MG TABS Take 500 mg by mouth daily.     . Ascorbic Acid (VITAMIN C) 1000 MG tablet Take 1,000 mg by mouth daily. Pt takes 2 504mtabs    . B Complex Vitamins (B COMPLEX PO) Take 1 capsule by mouth daily.     . budesonide-formoterol (SYMBICORT) 160-4.5 MCG/ACT inhaler 2 puffs BID 1 Inhaler 5  . CALCIUM CITRATE PO Take 1 tablet by mouth daily.    . Cyanocobalamin (VITAMIN B 12 PO) Take 1 tablet by mouth daily.    . Marland Kitchenocusate sodium (COLACE) 100 MG capsule 1 capsule as needed    . ferrous sulfate 325 (65 FE) MG tablet Take 325 mg by mouth daily with breakfast.    . guaiFENesin (MUCINEX) 600 MG 12 hr tablet Take 1 tablet (600 mg total) by mouth 2 (two) times daily as needed.    .Marland Kitchen  magnesium 30 MG tablet Take 30 mg by mouth daily.    . Magnesium Gluconate 550 MG TABS Take by mouth.    . Multiple Vitamin (MULTI VITAMIN DAILY PO) Take 1 tablet by mouth daily.    . Omega-3 Fatty Acids (FISH OIL) 1000 MG CAPS Take 1,000 mg by mouth daily.    Marland Kitchen omeprazole (PRILOSEC) 20 MG capsule   0  . Omeprazole (RA OMEPRAZOLE) 20 MG TBEC Take 20 mg by mouth.    . oxyCODONE (OXY IR/ROXICODONE) 5 MG immediate release tablet 1 tablet    . saccharomyces boulardii (FLORASTOR) 250 MG capsule Take 250 mg by mouth 2 (two) times daily.    . vitamin A  10000 UNIT capsule 1 capsule with food or milk     No current facility-administered medications for this visit.     SURGICAL HISTORY:  Past Surgical History:  Procedure Laterality Date  . CHEST TUBE INSERTION Right 08/02/2015   Procedure: INSERTION OF SECOND RIGHT CHEST TUBE WITH FLUROSCOPY;  Surgeon: Grace Isaac, MD;  Location: [facility];  Service: Thoracic;  Laterality: Right;  . CHEST TUBE INSERTION  07/28/2015  . COLONOSCOPY W/ BIOPSIES AND POLYPECTOMY    . HERNIA REPAIR  2011  . INGUINAL HERNIA REPAIR Left 07/15/2015   Procedure: OPEN LEFT INGUINAL HERNIA REPAIR;  Surgeon: Johnathan Hausen, MD;  Location[facility];  Service: General;  Laterality: Left;  With MESH  . TONSILLECTOMY    . VIDEO BRONCHOSCOPY WITH ENDOBRONCHIAL ULTRASOUND N/A 04/18/2015   Procedure: VIDEO BRONCHOSCOPY WITH ENDOBRONCHIAL ULTRASOUND;  Surgeon: Grace Isaac, MD;  Location: [facility];  Service: Thoracic;  Laterality: N/A;  . VIDEO BRONCHOSCOPY WITH INSERTION OF INTERBRONCHIAL VALVE (IBV) N/A 08/02/2015   Procedure: VIDEO BRONCHOSCOPY WITH ATTEMPTED INSERTION OF INTERBRONCHIAL VALVE (IBV) WITH FLUROSCOPY;  Surgeon: Grace Isaac, MD;  [facility];  Service: Thoracic;  Laterality: N/A;  . WISDOM TOOTH EXTRACTION      REVIEW OF SYSTEMS:  A comprehensive review of systems was negative except for: Constitutional: positive for fatigue Ears, nose, mouth, throat, and face: positive for nasal congestion Respiratory: positive for cough, dyspnea on exertion and wheezing   PHYSICAL EXAMINATION: General appearance: alert, cooperative and no distress Head: Normocephalic, without obvious abnormality, atraumatic Neck: no adenopathy, no JVD, supple, symmetrical, trachea midline and thyroid not enlarged, symmetric, no tenderness/mass/nodules Lymph nodes: Cervical, supraclavicular, and axillary nodes normal. Resp: clear to auscultation bilaterally Back: symmetric, no curvature. ROM normal. No CVA tenderness. Cardio:  regular rate and rhythm, S1, S2 normal, no murmur, click, rub or gallop GI: soft, non-tender; bowel sounds normal; no masses,  no organomegaly Extremities: extremities normal, atraumatic, no cyanosis or edema Neurologic: Alert and oriented X 3, normal strength and tone. Normal symmetric reflexes. Normal coordination and gait  ECOG PERFORMANCE STATUS: 1 - Symptomatic but completely ambulatory  There were no vitals taken for this visit.  LABORATORY DATA: Lab Results  Component Value Date   WBC 10.6 (H) 07/23/2016   HGB 15.2 07/23/2016   HCT 46.7 07/23/2016   MCV 90.3 07/23/2016   PLT 236 07/23/2016      Chemistry      Component Value Date/Time   NA 141 07/09/2016 1129   K 3.8 07/09/2016 1129   CL 102 09/12/2015 0410   CO2 26 07/09/2016 1129   BUN 21.4 07/09/2016 1129   CREATININE 1.1 07/09/2016 1129      Component Value Date/Time   CALCIUM 10.0 07/09/2016 1129   ALKPHOS 107 07/09/2016 1129  AST 22 07/09/2016 1129   ALT 20 07/09/2016 1129   BILITOT 0.33 07/09/2016 1129       RADIOGRAPHIC STUDIES: Ct Chest W Contrast  Result Date: 07/07/2016 CLINICAL DATA:  Non-small-cell carcinoma of right lung, stage III. EXAM: CT CHEST WITH CONTRAST TECHNIQUE: Multidetector CT imaging of the chest was performed during intravenous contrast administration. CONTRAST:  74m ISOVUE-300 IOPAMIDOL (ISOVUE-300) INJECTION 61% COMPARISON:  CT scan of May 10, 2016. FINDINGS: Cardiovascular: Atherosclerosis of thoracic aorta is noted without aneurysm or dissection. Normal cardiac size. Mediastinum/Nodes: Stable right peritracheal adenopathy is noted with largest lymph node measuring 9 mm. Stable right axillary adenopathy is noted with largest lymph node measuring 6 mm. Lungs/Pleura: No pneumothorax is noted. Extensive postsurgical changes are noted in right upper lobe consistent with prior lobectomy. Mediastinal shift to the right is noted with hyperexpansion of the left lung. Air bronchograms are noted  in the right upper lobe which are stable. Pleural thickening is noted in the inferior portion of the right major fissure with probable scarring seen in the right lung base. This is not significantly changed. Stable pleural-based part solid density is seen anteriorly in left lung apex measuring 14 x 10 mm best seen on image number 16 of series 5. Stable sub solid nodular density is noted posteriorly in left upper lobe best seen on image number 39 of series 5. Stable bulla formation is noted in superior segment of left lower lobe. Stable sub solid density measuring 22 x 19 mm is noted in left lung base best seen on image number 126 of series 5. 6 mm solid nodule is noted in superior segment of left lower lobe best seen on image number 56 of series 5 which is stable compared to prior exam. Upper Abdomen: No definite abnormality seen. 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 IMPRESSION: Stable postsurgical changes involving right upper lobe with mediastinal shift to the right. Stable consolidation is noted in the right upper lobe with air bronchograms. Stable right peritracheal and axillary adenopathy is noted. Stable pleural thickening and scarring is noted in the right lung. Stable 6 mm solid nodule is noted in superior segment of left lower lobe. Stable multiple part solid and sub solid nodules are noted in the left lung as described above. Electronically Signed   By: JMarijo Conception M.D.   On: 07/07/2016 07:56    ASSESSMENT AND PLAN: This is a very pleasant 75years old white male with stage IIIA non-small cell lung cancer. He is status post a course of concurrent chemoradiation with weekly carboplatin and paclitaxel with partial response. This was followed by 1 cycle of consolidation chemotherapy with carboplatin and gemcitabine but this was discontinued secondary to right pneumothorax and frequent hospitalization. The patient has been on observation for the  last few months. The recent CT scan of the chest showed improvement of the right hemithorax but there was enlarging subsided and new/enlarging soft tissue nodule in the left lung suspicious for metastatic disease. The patient is currently undergoing treatment with immunotherapy with Nivolumab status post 13 cycles. He tolerated his last treatment fairly well. I recommended for him to continue his treatment with immunotherapy and he will proceed with cycle #14 today. For pain management, he will continue on his current pain medication. For the mild anemia, he will taking over-the-counter iron tablets 1-2 tablets every day with vitamin C or orange juice. The patient would come back for follow-up visit in 2 weeks for reevaluation  with repeat blood work before starting cycle #15. Will also consider the patient for flu vaccine next visit. The patient was advised to call immediately if he has any concerning symptoms in the interval. He was advised to call immediately if he has any concerning symptoms in the interval. The patient voices understanding of current disease status and treatment options and is in agreement with the current care plan.  All questions were answered. The patient knows to call the clinic with any problems, questions or concerns. We can certainly see the patient much sooner if necessary.  Disclaimer: This note was dictated with voice recognition software. Similar sounding words can inadvertently be transcribed and may not be corrected upon review.

## 2016-08-06 ENCOUNTER — Encounter: Payer: Self-pay | Admitting: Internal Medicine

## 2016-08-06 ENCOUNTER — Ambulatory Visit (HOSPITAL_BASED_OUTPATIENT_CLINIC_OR_DEPARTMENT_OTHER): Payer: 59 | Admitting: Internal Medicine

## 2016-08-06 ENCOUNTER — Ambulatory Visit (HOSPITAL_BASED_OUTPATIENT_CLINIC_OR_DEPARTMENT_OTHER): Payer: 59

## 2016-08-06 ENCOUNTER — Telehealth: Payer: Self-pay | Admitting: Internal Medicine

## 2016-08-06 ENCOUNTER — Other Ambulatory Visit: Payer: Self-pay | Admitting: Internal Medicine

## 2016-08-06 ENCOUNTER — Other Ambulatory Visit (HOSPITAL_BASED_OUTPATIENT_CLINIC_OR_DEPARTMENT_OTHER): Payer: 59

## 2016-08-06 VITALS — BP 165/82 | HR 97 | Temp 97.8°F | Resp 18 | Ht 70.0 in | Wt 125.9 lb

## 2016-08-06 DIAGNOSIS — C3491 Malignant neoplasm of unspecified part of right bronchus or lung: Secondary | ICD-10-CM

## 2016-08-06 DIAGNOSIS — Z5112 Encounter for antineoplastic immunotherapy: Secondary | ICD-10-CM

## 2016-08-06 DIAGNOSIS — D649 Anemia, unspecified: Secondary | ICD-10-CM

## 2016-08-06 DIAGNOSIS — R0609 Other forms of dyspnea: Secondary | ICD-10-CM

## 2016-08-06 DIAGNOSIS — R0600 Dyspnea, unspecified: Secondary | ICD-10-CM

## 2016-08-06 DIAGNOSIS — R52 Pain, unspecified: Secondary | ICD-10-CM | POA: Diagnosis not present

## 2016-08-06 DIAGNOSIS — R05 Cough: Secondary | ICD-10-CM

## 2016-08-06 LAB — CBC WITH DIFFERENTIAL/PLATELET
BASO%: 0.2 % (ref 0.0–2.0)
Basophils Absolute: 0 10*3/uL (ref 0.0–0.1)
EOS ABS: 0.1 10*3/uL (ref 0.0–0.5)
EOS%: 1 % (ref 0.0–7.0)
HCT: 42.6 % (ref 38.4–49.9)
HGB: 14.3 g/dL (ref 13.0–17.1)
LYMPH%: 9.7 % — AB (ref 14.0–49.0)
MCH: 29.9 pg (ref 27.2–33.4)
MCHC: 33.6 g/dL (ref 32.0–36.0)
MCV: 88.9 fL (ref 79.3–98.0)
MONO#: 0.7 10*3/uL (ref 0.1–0.9)
MONO%: 5.6 % (ref 0.0–14.0)
NEUT%: 83.5 % — ABNORMAL HIGH (ref 39.0–75.0)
NEUTROS ABS: 9.8 10*3/uL — AB (ref 1.5–6.5)
PLATELETS: 232 10*3/uL (ref 140–400)
RBC: 4.79 10*6/uL (ref 4.20–5.82)
RDW: 14.6 % (ref 11.0–14.6)
WBC: 11.7 10*3/uL — AB (ref 4.0–10.3)
lymph#: 1.1 10*3/uL (ref 0.9–3.3)

## 2016-08-06 LAB — COMPREHENSIVE METABOLIC PANEL
ALK PHOS: 121 U/L (ref 40–150)
ALT: 18 U/L (ref 0–55)
ANION GAP: 10 meq/L (ref 3–11)
AST: 20 U/L (ref 5–34)
Albumin: 3.8 g/dL (ref 3.5–5.0)
BILIRUBIN TOTAL: 0.3 mg/dL (ref 0.20–1.20)
BUN: 21.6 mg/dL (ref 7.0–26.0)
CO2: 26 meq/L (ref 22–29)
Calcium: 10 mg/dL (ref 8.4–10.4)
Chloride: 103 mEq/L (ref 98–109)
Creatinine: 1.1 mg/dL (ref 0.7–1.3)
EGFR: 67 mL/min/{1.73_m2} — AB (ref >90)
Glucose: 95 mg/dl (ref 70–140)
POTASSIUM: 3.8 meq/L (ref 3.5–5.1)
Sodium: 139 mEq/L (ref 136–145)
TOTAL PROTEIN: 8.1 g/dL (ref 6.4–8.3)

## 2016-08-06 MED ORDER — SODIUM CHLORIDE 0.9 % IV SOLN
Freq: Once | INTRAVENOUS | Status: AC
  Administered 2016-08-06: 15:00:00 via INTRAVENOUS

## 2016-08-06 MED ORDER — SODIUM CHLORIDE 0.9 % IV SOLN
240.0000 mg | Freq: Once | INTRAVENOUS | Status: AC
  Administered 2016-08-06: 240 mg via INTRAVENOUS
  Filled 2016-08-06: qty 20

## 2016-08-06 NOTE — Patient Instructions (Signed)
[facility] Discharge Instructions for Patients Receiving Chemotherapy  Today you received the following chemotherapy agents:  Nivolumab.  To help prevent nausea and vomiting after your treatment, we encourage you to take your nausea medication as directed.   If you develop nausea and vomiting that is not controlled by your nausea medication, call the clinic.   BELOW ARE SYMPTOMS THAT SHOULD BE REPORTED IMMEDIATELY:  *FEVER GREATER THAN 100.5 F  *CHILLS WITH OR WITHOUT FEVER  NAUSEA AND VOMITING THAT IS NOT CONTROLLED WITH YOUR NAUSEA MEDICATION  *UNUSUAL SHORTNESS OF BREATH  *UNUSUAL BRUISING OR BLEEDING  TENDERNESS IN MOUTH AND THROAT WITH OR WITHOUT PRESENCE OF ULCERS  *URINARY PROBLEMS  *BOWEL PROBLEMS  UNUSUAL RASH Items with * indicate a potential emergency and should be followed up as soon as possible.  Feel free to call the clinic you have any questions or concerns. The clinic phone number is [phone].  Please show the CHEMO ALERT CARD at check-in to the Emergency Department and triage nurse.   

## 2016-08-06 NOTE — Progress Notes (Signed)
[name] [phone]   Fax:([phone]  OFFICE PROGRESS NOTE  Cammy Copa, MD [phone] [address], [address] [place] [address]  DIAGNOSIS: Non-small cell carcinoma of right lung, Adenocarcinoma, stage 3  Staging form: Lung, AJCC 7th Edition  Clinical stage from 04/21/2015: Stage IIIA (T2a, N2, M0) - Signed by Curt Bears, MD on 04/21/2015  Foundation One molecular studies reveal that he was positive for [place], [place], and STAG2 S1015f*20. He was negative for: EGFR, ALK, BRAF, MET, RET, ERBB2 and ROS1  PRIOR THERAPY:  1) Concurrent chemoradiation with chemotherapy in the form of weekly carboplatin for AUC 2 paclitaxel 45 mg/m given concurrent with radiation therapy. First cycle started 05/09/2015. Status post 7 cycles. 2) Consolidation chemotherapy with carboplatin for AUC of 5 on day 1 and gemcitabine 1000 MG/M2 on days 1 and 8 every 3 weeks. First dose 08/15/2015. Discontinued secondary to intolerance.  CURRENT THERAPY: Immunotherapy with Nivolumab 240 MG IV every 2 weeks. First dose 01/18/2016. Status post 14 cycles.  INTERVAL HISTORY: BJAYMIAN BOGART89y.o. male returns to the clinic today for follow-up visit accompanied by his daughter [name] The patient has no complaints today except for persistent cough. He is recovering from recent cold symptoms. He takes Claritin on as-needed basis. He also started drinking alcohol every night. He tolerated the last cycle of his immunotherapy with Nivolumab fairly well with no significant adverse effects. He denied having any significant skin rash. He denied having any nausea, vomiting, diarrhea or constipation. He has no chest pain but continues to have shortness of breath with exertion with no hemoptysis. He denied having any fever or chills. He has no nausea or vomiting. He is here today to start cycle #15 of his treatment.  MEDICAL HISTORY: Past Medical History:  Diagnosis  Date  . Cancer ([place]    skin cancer  . Constipation    AFTER ANESTHESIA  . Difficulty sleeping   . History of nonmelanoma skin cancer   . Inguinal hernia   . Lung mass    right / HAS HAD RADIATION AND CHEMO FOR LUNG CANCER  . Pneumothorax on right 08/24/2015  . Productive cough    "DUE TO RADIATION"  . Radiation 05/11/15-06/21/15   NSCLCA/right lung  . Spontaneous pneumothorax MANY YRS AGO AND AGAIN 04/04/15   HISTORY OF (ONLY)    ALLERGIES:  is allergic to aleve [naproxen].  MEDICATIONS:  Current Outpatient Prescriptions  Medication Sig Dispense Refill  . acetaminophen (TYLENOL) 325 MG tablet 2 tablets    . albuterol (PROVENTIL HFA) 108 (90 Base) MCG/ACT inhaler 1-2 puffs as needed    . ALPRAZolam (XANAX) 0.5 MG tablet 1 tablet    . Arginine (RA L-ARGININE) 1000 MG TABS Take 500 mg by mouth daily.     . Ascorbic Acid (VITAMIN C) 1000 MG tablet Take 1,000 mg by mouth daily. Pt takes 2 5063mtabs    . B Complex Vitamins (B COMPLEX PO) Take 1 capsule by mouth daily.     . budesonide-formoterol (SYMBICORT) 160-4.5 MCG/ACT inhaler 2 puffs BID 1 Inhaler 5  . CALCIUM CITRATE PO Take 1 tablet by mouth daily.    . Cyanocobalamin (VITAMIN B 12 PO) Take 1 tablet by mouth daily.    . Marland Kitchenocusate sodium (COLACE) 100 MG capsule 1 capsule as needed    . ferrous sulfate 325 (65 FE) MG tablet Take 325 mg by mouth daily with breakfast.    . guaiFENesin (  MUCINEX) 600 MG 12 hr tablet Take 1 tablet (600 mg total) by mouth 2 (two) times daily as needed.    . magnesium 30 MG tablet Take 30 mg by mouth daily.    . Magnesium Gluconate 550 MG TABS Take by mouth.    . Multiple Vitamin (MULTI VITAMIN DAILY PO) Take 1 tablet by mouth daily.    . Omega-3 Fatty Acids (FISH OIL) 1000 MG CAPS Take 1,000 mg by mouth daily.    Marland Kitchen omeprazole (PRILOSEC) 20 MG capsule   0  . Omeprazole (RA OMEPRAZOLE) 20 MG TBEC Take 20 mg by mouth.    . oxyCODONE (OXY IR/ROXICODONE) 5 MG immediate release tablet 1 tablet    .  saccharomyces boulardii (FLORASTOR) 250 MG capsule Take 250 mg by mouth 2 (two) times daily.    . vitamin A 10000 UNIT capsule 1 capsule with food or milk     No current facility-administered medications for this visit.     SURGICAL HISTORY:  Past Surgical History:  Procedure Laterality Date  . CHEST TUBE INSERTION Right 08/02/2015   Procedure: INSERTION OF SECOND RIGHT CHEST TUBE WITH FLUROSCOPY;  Surgeon: Grace Isaac, MD;  Location: [facility];  Service: Thoracic;  Laterality: Right;  . CHEST TUBE INSERTION  07/28/2015  . COLONOSCOPY W/ BIOPSIES AND POLYPECTOMY    . HERNIA REPAIR  2011  . INGUINAL HERNIA REPAIR Left 07/15/2015   Procedure: OPEN LEFT INGUINAL HERNIA REPAIR;  Surgeon: Johnathan Hausen, MD;  [facility];  Service: General;  Laterality: Left;  With MESH  . TONSILLECTOMY    . VIDEO BRONCHOSCOPY WITH ENDOBRONCHIAL ULTRASOUND N/A 04/18/2015   Procedure: VIDEO BRONCHOSCOPY WITH ENDOBRONCHIAL ULTRASOUND;  Surgeon: Grace Isaac, MD;  Location: [facility];  Service: Thoracic;  Laterality: N/A;  . VIDEO BRONCHOSCOPY WITH INSERTION OF INTERBRONCHIAL VALVE (IBV) N/A 08/02/2015   Procedure: VIDEO BRONCHOSCOPY WITH ATTEMPTED INSERTION OF INTERBRONCHIAL VALVE (IBV) WITH FLUROSCOPY;  Surgeon: Grace Isaac, MD;  [facility];  Service: Thoracic;  Laterality: N/A;  . WISDOM TOOTH EXTRACTION      REVIEW OF SYSTEMS:  A comprehensive review of systems was negative except for: Constitutional: positive for fatigue Ears, nose, mouth, throat, and face: positive for nasal congestion Respiratory: positive for cough, dyspnea on exertion and wheezing   PHYSICAL EXAMINATION: General appearance: alert, cooperative and no distress Head: Normocephalic, without obvious abnormality, atraumatic Neck: no adenopathy, no JVD, supple, symmetrical, trachea midline and thyroid not enlarged, symmetric, no tenderness/mass/nodules Lymph nodes: Cervical, supraclavicular, and axillary nodes normal. Resp:  clear to auscultation bilaterally Back: symmetric, no curvature. ROM normal. No CVA tenderness. Cardio: regular rate and rhythm, S1, S2 normal, no murmur, click, rub or gallop GI: soft, non-tender; bowel sounds normal; no masses,  no organomegaly Extremities: extremities normal, atraumatic, no cyanosis or edema Neurologic: Alert and oriented X 3, normal strength and tone. Normal symmetric reflexes. Normal coordination and gait  ECOG PERFORMANCE STATUS: 1 - Symptomatic but completely ambulatory  Blood pressure (!) 165/82, pulse 97, temperature 97.8 F (36.6 C), temperature source Oral, resp. rate 18, height 5' 10"  (1.778 m), weight 125 lb 14.4 oz (57.1 kg), SpO2 96 %.  LABORATORY DATA: Lab Results  Component Value Date   WBC 11.7 (H) 08/06/2016   HGB 14.3 08/06/2016   HCT 42.6 08/06/2016   MCV 88.9 08/06/2016   PLT 232 08/06/2016      Chemistry      Component Value Date/Time   NA 141 07/23/2016 1345  K 4.0 07/23/2016 1345   CL 102 09/12/2015 0410   CO2 24 07/23/2016 1345   BUN 20.8 07/23/2016 1345   CREATININE 1.1 07/23/2016 1345      Component Value Date/Time   CALCIUM 10.3 07/23/2016 1345   ALKPHOS 117 07/23/2016 1345   AST 22 07/23/2016 1345   ALT 21 07/23/2016 1345   BILITOT 0.69 07/23/2016 1345       RADIOGRAPHIC STUDIES: No results found.  ASSESSMENT AND PLAN: This is a very pleasant 75 years old white male with stage IIIA non-small cell lung cancer. He is status post a course of concurrent chemoradiation with weekly carboplatin and paclitaxel with partial response. This was followed by 1 cycle of consolidation chemotherapy with carboplatin and gemcitabine but this was discontinued secondary to right pneumothorax and frequent hospitalization. The patient has been on observation for the last few months. The recent CT scan of the chest showed improvement of the right hemithorax but there was enlarging subsided and new/enlarging soft tissue nodule in the left lung  suspicious for metastatic disease. The patient is currently undergoing treatment with immunotherapy with Nivolumab status post 14 cycles. He tolerated his last treatment fairly well. I recommended for him to continue his treatment with immunotherapy and he will proceed with cycle #15 today. For pain management, he will continue on his current pain medication. For the mild anemia, he will taking over-the-counter iron tablets 1-2 tablets every day with vitamin C or orange juice. For hypertension, I advised the patient to check his blood pressure regularly at home and if continues to be elevated to consult with his primary care physician for evaluation and treatment. The patient would come back for follow-up visit in 2 weeks for reevaluation with repeat blood work before starting cycle #16. The patient was advised to call immediately if he has any concerning symptoms in the interval. He was advised to call immediately if he has any concerning symptoms in the interval. The patient voices understanding of current disease status and treatment options and is in agreement with the current care plan.  All questions were answered. The patient knows to call the clinic with any problems, questions or concerns. We can certainly see the patient much sooner if necessary.  Disclaimer: This note was dictated with voice recognition software. Similar sounding words can inadvertently be transcribed and may not be corrected upon review.

## 2016-08-06 NOTE — Telephone Encounter (Signed)
GAVE PATIENT AVS REPORT AND APPOINTMENTS FOR October THRU December

## 2016-08-07 ENCOUNTER — Ambulatory Visit (INDEPENDENT_AMBULATORY_CARE_PROVIDER_SITE_OTHER): Payer: 59 | Admitting: Internal Medicine

## 2016-08-07 ENCOUNTER — Telehealth: Payer: Self-pay | Admitting: Internal Medicine

## 2016-08-07 ENCOUNTER — Ambulatory Visit: Payer: 59 | Admitting: Internal Medicine

## 2016-08-07 DIAGNOSIS — R0600 Dyspnea, unspecified: Secondary | ICD-10-CM | POA: Diagnosis not present

## 2016-08-07 LAB — PULMONARY FUNCTION TEST
FEF 25-75 Pre: 1.47 L/s
FEF2575-%Pred-Pre: 76 %
FEV1-%Pred-Pre: 72 %
FEV1-Pre: 1.94 L
FEV1FVC-%Pred-Pre: 102 %
FEV6-%Pred-Pre: 74 %
FEV6-Pre: 2.63 L
FEV6FVC-%Pred-Pre: 107 %
FVC-%Pred-Pre: 69 %
FVC-Pre: 2.63 L
Pre FEV1/FVC ratio: 74 %
Pre FEV6/FVC Ratio: 100 %

## 2016-08-07 MED ORDER — DOXYCYCLINE HYCLATE 100 MG PO TABS: 100.0000 mg | ORAL_TABLET | Freq: Two times a day (BID) | ORAL | 0 refills | Status: DC

## 2016-08-07 MED ORDER — PREDNISONE 10 MG PO TABS: ORAL_TABLET | ORAL | 0 refills | Status: DC

## 2016-08-07 NOTE — Telephone Encounter (Signed)
Spoke with pt in the lobby. States that he has a sinus infection. Reports sinus pressure, sinus congestion, cough. Cough and sinuses are producing yellow mucus. Denies fever. Symptoms started 4 weeks ago. Would like MR's recommendations. Pt has gone home and would like [place] to call him once we have an answer.  MR - please advise. Thanks.

## 2016-08-07 NOTE — Telephone Encounter (Signed)
Spoke with MR-he states to give patient Doxycycline 100 mg #20 take 1 po BID with meals; avoid sunlight. Prednisone '10mg'$  #11 take '40mg'$  1 day , 30 mg x 1 day, 20 mg x 1 day, 10 mg x 1 day 5 mg x 1 day then stop no refills on either rx. Desha  Pt aware and nothing more needed at this time.

## 2016-08-08 ENCOUNTER — Telehealth: Payer: Self-pay | Admitting: *Deleted

## 2016-08-08 NOTE — Telephone Encounter (Signed)
Patient called, he stated that he was given Prednisone, by another Provider. He wanted to know if it was ok for him to take it now,, He did state that at one time he thought Dr. Julien Nordmann told him not to take it. Message was left on vmail

## 2016-08-08 NOTE — Telephone Encounter (Signed)
Per Julien Nordmann I told pt that he can take doxycycline and do not take prednisone.

## 2016-08-09 ENCOUNTER — Encounter: Payer: Self-pay | Admitting: Internal Medicine

## 2016-08-09 ENCOUNTER — Ambulatory Visit (INDEPENDENT_AMBULATORY_CARE_PROVIDER_SITE_OTHER): Payer: 59 | Admitting: Internal Medicine

## 2016-08-09 VITALS — BP 128/82 | HR 102 | Ht 70.0 in | Wt 124.8 lb

## 2016-08-09 DIAGNOSIS — J439 Emphysema, unspecified: Secondary | ICD-10-CM | POA: Diagnosis not present

## 2016-08-09 DIAGNOSIS — R5383 Other fatigue: Secondary | ICD-10-CM

## 2016-08-09 DIAGNOSIS — J449 Chronic obstructive pulmonary disease, unspecified: Secondary | ICD-10-CM

## 2016-08-09 DIAGNOSIS — C3490 Malignant neoplasm of unspecified part of unspecified bronchus or lung: Secondary | ICD-10-CM | POA: Diagnosis not present

## 2016-08-09 NOTE — Patient Instructions (Addendum)
ICD-9-CM ICD-10-CM   1. Stage 2 moderate COPD by GOLD classification ([place]) 496 J44.9 AMB referral to pulmonary rehabilitation  2. Other fatigue 780.79 R53.83 AMB referral to pulmonary rehabilitation  3. Malignant neoplasm of lung, unspecified laterality, unspecified part of lung (H[facility]) 162.9 C34.90 AMB referral to pulmonary rehabilitation  4. Pulmonary emphysema, unspecified emphysema type ([place]) 492.8 J43.9     Glad sinusitis improving - finish doxy COPD is moderate at 72% lung fucntion - this is better than expected Flu shot asap when better and off antibiotics Contnue symbicort Refer Pulm rehab - basis of fatigue and lung cancer  Risk for pneumothorax recurrence remains - advise early pleurodesis if this happens  Followup  6 months or sooner if needed

## 2016-08-09 NOTE — Progress Notes (Signed)
Subjective:     Patient ID: Gabriel [name], male   DOB: [birth year], 75 y.o.   MRN: [id number]  HPI     OV 05/15/2016  Chief Complaint  Patient presents with  . Follow-up    pt has a prod cough with white mucus, denies any other breathing complaints.      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 Hx of lung abscess rt side long hospiotalization fall 2016 wityh resolution with resultant rt fibrothorax RUL  He continues to do well. He is on immunotherapy. His functional status is very good ECOG he does have fatigue which is improved since the hospitalization in for 2016 but worse compared to the baseline of Summer 2016. This is improved after some iron supplementation. He is working with Dr. Julien Nordmann on this issue. He is exercising. He has some questions about incentive spirometry.  He did have overnight oxygen test April 2017 after his last visit. He is inquiring about the results. We do not have the results in our computer. We will get this tomorrow.   In apriol 2017 - walking desat - normal Now ONO - may 2017 normal   OV 08/09/2016  Chief Complaint  Patient presents with  . Follow-up    Spiro w/BD 08/07/16. Pt states that his breathing has been okay since last OV.      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 Hx of lung abscess rt side long hospiotalization fall 2016 wityh resolution with resultant rt fibrothorax RUL  He continues to do well. He is on immunotherapy. His functional status is very good ECOG he does have fatigue.  He does do some daily walks but it is not sufficient enough. Recently his walking desaturation test and overnight oxygen test were normal.  spirometry tst 08/07/2016 shos FEV1 1.94 L/72% , FVc 2.63 L/69% with a ratio 74 /102%. And this is much better than expected. 2 days ago he complained of chronic sinus infection may prescribe doxycycline over the phone and prednisone. He is not taking the  prednisone because of immunotherapy. He is already feeling better with the doxycycline and he plans to completed. He will have the flu shot after that. He is worried about recurrent pneumothorax and what to do in that case. He did have a CT chest in September 2017 that showed stability without active cancer. Overall he is pleased with his progress.    CT chest 07/06/2016  IMPRESSION: Stable postsurgical changes involving right upper lobe with mediastinal shift to the right. Stable consolidation is noted in the right upper lobe with air bronchograms.  Stable right peritracheal and axillary adenopathy is noted.  Stable pleural thickening and scarring is noted in the right lung.  Stable 6 mm solid nodule is noted in superior segment of left lower lobe.  Stable multiple part solid and sub solid nodules are noted in the left lung as described above.   Electronically Signed   By: Marijo Conception, M.D.   On: 07/07/2016 07:56  has a past medical history of Cancer ([place]); Constipation; Difficulty sleeping; History of nonmelanoma skin cancer; Inguinal hernia; Lung mass; Pneumothorax on right (08/24/2015); Productive cough; Radiation (05/11/15-06/21/15); and Spontaneous pneumothorax (MANY YRS AGO AND AGAIN 04/04/15).   reports that he has been smoking Cigarettes.  He has a 27.50 pack-year smoking history. He has never used smokeless tobacco.  Past Surgical History:  Procedure Laterality Date  . CHEST TUBE INSERTION Right 08/02/2015  Procedure: INSERTION OF SECOND RIGHT CHEST TUBE WITH FLUROSCOPY;  Surgeon: Grace Isaac, MD;  Location: [place];  Service: Thoracic;  Laterality: Right;  . CHEST TUBE INSERTION  07/28/2015  . COLONOSCOPY W/ BIOPSIES AND POLYPECTOMY    . HERNIA REPAIR  2011  . INGUINAL HERNIA REPAIR Left 07/15/2015   Procedure: OPEN LEFT INGUINAL HERNIA REPAIR;  Surgeon: Johnathan Hausen, MD;  Location: [facility];  Service: General;  Laterality: Left;  With MESH  . TONSILLECTOMY     . VIDEO BRONCHOSCOPY WITH ENDOBRONCHIAL ULTRASOUND N/A 04/18/2015   Procedure: VIDEO BRONCHOSCOPY WITH ENDOBRONCHIAL ULTRASOUND;  Surgeon: Grace Isaac, MD;  Location: [facility];  Service: Thoracic;  Laterality: N/A;  . VIDEO BRONCHOSCOPY WITH INSERTION OF INTERBRONCHIAL VALVE (IBV) N/A 08/02/2015   Procedure: VIDEO BRONCHOSCOPY WITH ATTEMPTED INSERTION OF INTERBRONCHIAL VALVE (IBV) WITH FLUROSCOPY;  Surgeon: Grace Isaac, MD;  [facility];  Service: Thoracic;  Laterality: N/A;  . WISDOM TOOTH EXTRACTION      Allergies  Allergen Reactions  . Aleve [Naproxen] Rash    Immunization History  Administered Date(s) Administered  . Pneumococcal Conjugate-13 02/14/2016    Family History  Problem Relation Age of Onset  . Heart attack Father   . Heart disease Father   . Hypertension Mother      Current Outpatient Prescriptions:  .  acetaminophen (TYLENOL) 325 MG tablet, 2 tablets, Disp: , Rfl:  .  albuterol (PROVENTIL HFA) 108 (90 Base) MCG/ACT inhaler, 1-2 puffs as needed, Disp: , Rfl:  .  ALPRAZolam (XANAX) 0.5 MG tablet, 1 tablet, Disp: , Rfl:  .  Arginine (RA L-ARGININE) 1000 MG TABS, Take 500 mg by mouth daily. , Disp: , Rfl:  .  Ascorbic Acid (VITAMIN C) 1000 MG tablet, Take 1,000 mg by mouth daily. Pt takes 2 '500mg'$  tabs, Disp: , Rfl:  .  B Complex Vitamins (B COMPLEX PO), Take 1 capsule by mouth daily. , Disp: , Rfl:  .  budesonide-formoterol (SYMBICORT) 160-4.5 MCG/ACT inhaler, 2 puffs BID, Disp: 1 Inhaler, Rfl: 5 .  CALCIUM CITRATE PO, Take 1 tablet by mouth daily., Disp: , Rfl:  .  Cyanocobalamin (VITAMIN B 12 PO), Take 1 tablet by mouth daily., Disp: , Rfl:  .  docusate sodium (COLACE) 100 MG capsule, 1 capsule as needed, Disp: , Rfl:  .  doxycycline (VIBRA-TABS) 100 MG tablet, Take 1 tablet (100 mg total) by mouth 2 (two) times daily., Disp: 20 tablet, Rfl: 0 .  ferrous sulfate 325 (65 FE) MG tablet, Take 325 mg by mouth daily with breakfast., Disp: , Rfl:  .   guaiFENesin (MUCINEX) 600 MG 12 hr tablet, Take 1 tablet (600 mg total) by mouth 2 (two) times daily as needed., Disp: , Rfl:  .  magnesium 30 MG tablet, Take 30 mg by mouth daily., Disp: , Rfl:  .  Magnesium Gluconate 550 MG TABS, Take by mouth., Disp: , Rfl:  .  Multiple Vitamin (MULTI VITAMIN DAILY PO), Take 1 tablet by mouth daily., Disp: , Rfl:  .  Omega-3 Fatty Acids (FISH OIL) 1000 MG CAPS, Take 1,000 mg by mouth daily., Disp: , Rfl:  .  omeprazole (PRILOSEC) 20 MG capsule, , Disp: , Rfl: 0 .  oxyCODONE (OXY IR/ROXICODONE) 5 MG immediate release tablet, 1 tablet, Disp: , Rfl:  .  saccharomyces boulardii (FLORASTOR) 250 MG capsule, Take 250 mg by mouth 2 (two) times daily., Disp: , Rfl:  .  vitamin A 10000 UNIT capsule, 1 capsule with food  or milk, Disp: , Rfl:  .  Omeprazole (RA OMEPRAZOLE) 20 MG TBEC, Take 20 mg by mouth., Disp: , Rfl:     Review of Systems     Objective:   Physical Exam  Constitutional: He is oriented to person, place, and time. He appears well-developed and well-nourished. No distress.  HENT:  Head: Normocephalic and atraumatic.  Right Ear: External ear normal.  Left Ear: External ear normal.  Mouth/Throat: Oropharynx is clear and moist. No oropharyngeal exudate.  Eyes: Conjunctivae and EOM are normal. Pupils are equal, round, and reactive to light. Right eye exhibits no discharge. Left eye exhibits no discharge. No scleral icterus.  Neck: Normal range of motion. Neck supple. No JVD present. No tracheal deviation present. No thyromegaly present.  Cardiovascular: Normal rate, regular rhythm and intact distal pulses.  Exam reveals no gallop and no friction rub.   No murmur heard. Pulmonary/Chest: Effort normal and breath sounds normal. No respiratory distress. He has no wheezes. He has no rales. He exhibits no tenderness.  RUL bronchial breath sounds +  Abdominal: Soft. Bowel sounds are normal. He exhibits no distension and no mass. There is no tenderness. There  is no rebound and no guarding.  Musculoskeletal: Normal range of motion. He exhibits no edema or tenderness.  Scoliotic, RT shoulder down  Lymphadenopathy:    He has no cervical adenopathy.  Neurological: He is alert and oriented to person, place, and time. He has normal reflexes. No cranial nerve deficit. Coordination normal.  Skin: Skin is warm and dry. No rash noted. He is not diaphoretic. No erythema. No pallor.  Psychiatric: He has a normal mood and affect. His behavior is normal. Judgment and thought content normal.  Nursing note and vitals reviewed.  Vitals:   08/09/16 1133  BP: 128/82  Pulse: (!) 102  SpO2: 97%  Weight: 124 lb 12.8 oz (56.6 kg)  Height: '5\' 10"'$  (1.778 m)   Estimated body mass index is 17.91 kg/m as calculated from the following:   Height as of this encounter: '5\' 10"'$  (1.778 m).   Weight as of this encounter: 124 lb 12.8 oz (56.6 kg).      Assessment:       ICD-9-CM ICD-10-CM   1. Stage 2 moderate COPD by GOLD classification ([place]) 496 J44.9   2. Other fatigue 780.79 R53.83        Plan:      Glad sinusitis improving - finish doxy COPD is moderate at 72% lung fucntion - this is better than expected Flu shot asap when better and off antibiotics Contnue symbicort Refer Pulm rehab - basis of fatigue and lung cancer  Risk for pneumothorax recurrence remains - advise early pleurodesis if this happens  Followup  6 months or sooner if needed   Dr. Brand Males, M.D., [facility].C.P Pulmonary and Critical Care Medicine Staff Physician [facility] Pager: [phone], If no answer or between  15:00h - 7:00h: call [phone]  08/09/2016 12:02 PM

## 2016-08-20 ENCOUNTER — Ambulatory Visit (HOSPITAL_BASED_OUTPATIENT_CLINIC_OR_DEPARTMENT_OTHER): Payer: 59

## 2016-08-20 ENCOUNTER — Other Ambulatory Visit (HOSPITAL_BASED_OUTPATIENT_CLINIC_OR_DEPARTMENT_OTHER): Payer: 59

## 2016-08-20 ENCOUNTER — Ambulatory Visit (HOSPITAL_BASED_OUTPATIENT_CLINIC_OR_DEPARTMENT_OTHER): Payer: 59 | Admitting: Internal Medicine

## 2016-08-20 ENCOUNTER — Encounter: Payer: Self-pay | Admitting: Internal Medicine

## 2016-08-20 VITALS — BP 157/79 | HR 18 | Temp 97.5°F | Resp 18 | Ht 70.0 in | Wt 123.8 lb

## 2016-08-20 DIAGNOSIS — Z5112 Encounter for antineoplastic immunotherapy: Secondary | ICD-10-CM | POA: Diagnosis not present

## 2016-08-20 DIAGNOSIS — I10 Essential (primary) hypertension: Secondary | ICD-10-CM

## 2016-08-20 DIAGNOSIS — C3491 Malignant neoplasm of unspecified part of right bronchus or lung: Secondary | ICD-10-CM

## 2016-08-20 DIAGNOSIS — D649 Anemia, unspecified: Secondary | ICD-10-CM | POA: Diagnosis not present

## 2016-08-20 DIAGNOSIS — R0609 Other forms of dyspnea: Secondary | ICD-10-CM

## 2016-08-20 DIAGNOSIS — R52 Pain, unspecified: Secondary | ICD-10-CM

## 2016-08-20 DIAGNOSIS — R05 Cough: Secondary | ICD-10-CM

## 2016-08-20 LAB — COMPREHENSIVE METABOLIC PANEL
ALT: 23 U/L (ref 0–55)
ANION GAP: 11 meq/L (ref 3–11)
AST: 21 U/L (ref 5–34)
Albumin: 3.9 g/dL (ref 3.5–5.0)
Alkaline Phosphatase: 122 U/L (ref 40–150)
BILIRUBIN TOTAL: 0.26 mg/dL (ref 0.20–1.20)
BUN: 27.3 mg/dL — ABNORMAL HIGH (ref 7.0–26.0)
CO2: 25 meq/L (ref 22–29)
Calcium: 10 mg/dL (ref 8.4–10.4)
Chloride: 105 mEq/L (ref 98–109)
Creatinine: 1.1 mg/dL (ref 0.7–1.3)
EGFR: 65 mL/min/{1.73_m2} — AB (ref >90)
GLUCOSE: 104 mg/dL (ref 70–140)
POTASSIUM: 4 meq/L (ref 3.5–5.1)
SODIUM: 140 meq/L (ref 136–145)
TOTAL PROTEIN: 8.4 g/dL — AB (ref 6.4–8.3)

## 2016-08-20 LAB — CBC WITH DIFFERENTIAL/PLATELET
BASO%: 0.7 % (ref 0.0–2.0)
BASOS ABS: 0.1 10*3/uL (ref 0.0–0.1)
EOS ABS: 0.1 10*3/uL (ref 0.0–0.5)
EOS%: 1.2 % (ref 0.0–7.0)
HCT: 45.9 % (ref 38.4–49.9)
HGB: 15.1 g/dL (ref 13.0–17.1)
LYMPH%: 7.3 % — AB (ref 14.0–49.0)
MCH: 29.5 pg (ref 27.2–33.4)
MCHC: 32.9 g/dL (ref 32.0–36.0)
MCV: 89.8 fL (ref 79.3–98.0)
MONO#: 0.6 10*3/uL (ref 0.1–0.9)
MONO%: 5.8 % (ref 0.0–14.0)
NEUT%: 85 % — AB (ref 39.0–75.0)
NEUTROS ABS: 9.2 10*3/uL — AB (ref 1.5–6.5)
PLATELETS: 260 10*3/uL (ref 140–400)
RBC: 5.11 10*6/uL (ref 4.20–5.82)
RDW: 14.4 % (ref 11.0–14.6)
WBC: 10.8 10*3/uL — AB (ref 4.0–10.3)
lymph#: 0.8 10*3/uL — ABNORMAL LOW (ref 0.9–3.3)

## 2016-08-20 MED ORDER — SODIUM CHLORIDE 0.9 % IV SOLN
Freq: Once | INTRAVENOUS | Status: AC
  Administered 2016-08-20: 14:00:00 via INTRAVENOUS

## 2016-08-20 MED ORDER — SODIUM CHLORIDE 0.9 % IV SOLN
240.0000 mg | Freq: Once | INTRAVENOUS | Status: AC
  Administered 2016-08-20: 240 mg via INTRAVENOUS
  Filled 2016-08-20: qty 4

## 2016-08-20 NOTE — Patient Instructions (Signed)
[facility] Discharge Instructions for Patients Receiving Chemotherapy  Today you received the following chemotherapy agents nivolamab  To help prevent nausea and vomiting after your treatment, we encourage you to take your nausea medication as prescribed.   If you develop nausea and vomiting that is not controlled by your nausea medication, call the clinic.   BELOW ARE SYMPTOMS THAT SHOULD BE REPORTED IMMEDIATELY:  *FEVER GREATER THAN 100.5 F  *CHILLS WITH OR WITHOUT FEVER  NAUSEA AND VOMITING THAT IS NOT CONTROLLED WITH YOUR NAUSEA MEDICATION  *UNUSUAL SHORTNESS OF BREATH  *UNUSUAL BRUISING OR BLEEDING  TENDERNESS IN MOUTH AND THROAT WITH OR WITHOUT PRESENCE OF ULCERS  *URINARY PROBLEMS  *BOWEL PROBLEMS  UNUSUAL RASH Items with * indicate a potential emergency and should be followed up as soon as possible.  Feel free to call the clinic you have any questions or concerns. The clinic phone number is [phone].  Please show the [place] at check-in to the Emergency Department and triage nurse.

## 2016-08-20 NOTE — Patient Instructions (Signed)
Smoking Cessation, Tips for Success If you are ready to quit smoking, congratulations! You have chosen to help yourself be healthier. Cigarettes bring nicotine, tar, carbon monoxide, and other irritants into your body. Your lungs, heart, and blood vessels will be able to work better without these poisons. There are many different ways to quit smoking. Nicotine gum, nicotine patches, a nicotine inhaler, or nicotine nasal spray can help with physical craving. Hypnosis, support groups, and medicines help break the habit of smoking. WHAT THINGS CAN I DO TO MAKE QUITTING EASIER?  Here are some tips to help you quit for good:  Pick a date when you will quit smoking completely. Tell all of your friends and family about your plan to quit on that date.  Do not try to slowly cut down on the number of cigarettes you are smoking. Pick a quit date and quit smoking completely starting on that day.  Throw away all cigarettes.   Clean and remove all ashtrays from your home, work, and car.  On a card, write down your reasons for quitting. Carry the card with you and read it when you get the urge to smoke.  Cleanse your body of nicotine. Drink enough water and fluids to keep your urine clear or pale yellow. Do this after quitting to flush the nicotine from your body.  Learn to predict your moods. Do not let a bad situation be your excuse to have a cigarette. Some situations in your life might tempt you into wanting a cigarette.  Never have "just one" cigarette. It leads to wanting another and another. Remind yourself of your decision to quit.  Change habits associated with smoking. If you smoked while driving or when feeling stressed, try other activities to replace smoking. Stand up when drinking your coffee. Brush your teeth after eating. Sit in a different chair when you read the paper. Avoid alcohol while trying to quit, and try to drink fewer caffeinated beverages. Alcohol and caffeine may urge you to  smoke.  Avoid foods and drinks that can trigger a desire to smoke, such as sugary or spicy foods and alcohol.  Ask people who smoke not to smoke around you.  Have something planned to do right after eating or having a cup of coffee. For example, plan to take a walk or exercise.  Try a relaxation exercise to calm you down and decrease your stress. Remember, you may be tense and nervous for the first 2 weeks after you quit, but this will pass.  Find new activities to keep your hands busy. Play with a pen, coin, or rubber band. Doodle or draw things on paper.  Brush your teeth right after eating. This will help cut down on the craving for the taste of tobacco after meals. You can also try mouthwash.   Use oral substitutes in place of cigarettes. Try using lemon drops, carrots, cinnamon sticks, or chewing gum. Keep them handy so they are available when you have the urge to smoke.  When you have the urge to smoke, try deep breathing.  Designate your home as a nonsmoking area.  If you are a heavy smoker, ask your health care provider about a prescription for nicotine chewing gum. It can ease your withdrawal from nicotine.  Reward yourself. Set aside the cigarette money you save and buy yourself something nice.  Look for support from others. Join a support group or smoking cessation program. Ask someone at home or at work to help you with your plan   to quit smoking.  Always ask yourself, "Do I need this cigarette or is this just a reflex?" Tell yourself, "Today, I choose not to smoke," or "I do not want to smoke." You are reminding yourself of your decision to quit.  Do not replace cigarette smoking with electronic cigarettes (commonly called e-cigarettes). The safety of e-cigarettes is unknown, and some may contain harmful chemicals.  If you relapse, do not give up! Plan ahead and think about what you will do the next time you get the urge to smoke. HOW WILL I FEEL WHEN I QUIT SMOKING? You  may have symptoms of withdrawal because your body is used to nicotine (the addictive substance in cigarettes). You may crave cigarettes, be irritable, feel very hungry, cough often, get headaches, or have difficulty concentrating. The withdrawal symptoms are only temporary. They are strongest when you first quit but will go away within 10-14 days. When withdrawal symptoms occur, stay in control. Think about your reasons for quitting. Remind yourself that these are signs that your body is healing and getting used to being without cigarettes. Remember that withdrawal symptoms are easier to treat than the major diseases that smoking can cause.  Even after the withdrawal is over, expect periodic urges to smoke. However, these cravings are generally short lived and will go away whether you smoke or not. Do not smoke! WHAT RESOURCES ARE AVAILABLE TO HELP ME QUIT SMOKING? Your health care provider can direct you to community resources or hospitals for support, which may include:  Group support.  Education.  Hypnosis.  Therapy.   This information is not intended to replace advice given to you by your health care provider. Make sure you discuss any questions you have with your health care provider.   Document Released: 07/13/2004 Document Revised: 11/05/2014 Document Reviewed: 04/02/2013 Elsevier Interactive Patient Education 2016 [employer]  

## 2016-08-20 NOTE — Progress Notes (Signed)
[facility] [phone]   [phone]  OFFICE PROGRESS NOTE  Cammy Copa, MD [phone], [address] [place] [address]  DIAGNOSIS: Non-small cell carcinoma of right lung, Adenocarcinoma, stage 3  Staging form: Lung, AJCC 7th Edition  Clinical stage from 04/21/2015: Stage IIIA (T2a, N2, M0) - Signed by Curt Bears, MD on 04/21/2015  [facility] molecular studies reveal that he was positive for [place], [place], and STAG2 [id number]. He was negative for: EGFR, ALK, BRAF, MET, RET, ERBB2 and ROS1  PRIOR THERAPY:  1) Concurrent chemoradiation with chemotherapy in the form of weekly carboplatin for AUC 2 paclitaxel 45 mg/m given concurrent with radiation therapy. First cycle started 05/09/2015. Status post 7 cycles. 2) Consolidation chemotherapy with carboplatin for AUC of 5 on day 1 and gemcitabine 1000 MG/M2 on days 1 and 8 every 3 weeks. First dose 08/15/2015. Discontinued secondary to intolerance.  CURRENT THERAPY: Immunotherapy with Nivolumab 240 MG IV every 2 weeks. First dose 01/18/2016. Status post 14 cycles.  INTERVAL HISTORY: BELIM [id number].o. male returns to the clinic today for follow-up visit accompanied by his daughter [name] The patient has no complaints today except for persistent cough. He tolerated the last cycle of his immunotherapy with Nivolumab fairly well with no significant adverse effects. He denied having any significant skin rash. He denied having any nausea, vomiting, diarrhea or constipation. He has no chest pain but continues to have shortness of breath with exertion with no hemoptysis. He denied having any fever or chills. He has no nausea or vomiting. He is here today to start cycle #16 of his treatment.  MEDICAL HISTORY: Past Medical History:  Diagnosis Date  . Cancer ([place]    skin cancer  . Constipation    AFTER ANESTHESIA  . Difficulty sleeping   . History of nonmelanoma  skin cancer   . Inguinal hernia   . Lung mass    right / HAS HAD RADIATION AND CHEMO FOR LUNG CANCER  . Pneumothorax on right 08/24/2015  . Productive cough    "DUE TO RADIATION"  . Radiation 05/11/15-06/21/15   NSCLCA/right lung  . Spontaneous pneumothorax MANY YRS AGO AND AGAIN 04/04/15   HISTORY OF (ONLY)    ALLERGIES:  is allergic to aleve [naproxen].  MEDICATIONS:  Current Outpatient Prescriptions  Medication Sig Dispense Refill  . acetaminophen (TYLENOL) 325 MG tablet 2 tablets    . albuterol (PROVENTIL HFA) 108 (90 Base) MCG/ACT inhaler 1-2 puffs as needed    . Arginine (RA L-ARGININE) 1000 MG TABS Take 500 mg by mouth daily.     . Ascorbic Acid (VITAMIN C) 1000 MG tablet Take 1,000 mg by mouth daily. Pt takes 2 5018mtabs    . B Complex Vitamins (B COMPLEX PO) Take 1 capsule by mouth daily.     . budesonide-formoterol (SYMBICORT) 160-4.5 MCG/ACT inhaler 2 puffs BID 1 Inhaler 5  . CALCIUM CITRATE PO Take 1 tablet by mouth daily.    . Cyanocobalamin (VITAMIN B 12 PO) Take 1 tablet by mouth daily.    . ferrous sulfate 325 (65 FE) MG tablet Take 325 mg by mouth daily with breakfast.    . guaiFENesin (MUCINEX) 600 MG 12 hr tablet Take 1 tablet (600 mg total) by mouth 2 (two) times daily as needed.    . magic mouthwash SOLN Take 5 mLs by mouth 4 (four) times daily as needed.    . magnesium 30 MG  tablet Take 30 mg by mouth daily.    . Magnesium Gluconate 550 MG TABS Take by mouth.    . Multiple Vitamin (MULTI VITAMIN DAILY PO) Take 1 tablet by mouth daily.    . Omega-3 Fatty Acids (FISH OIL) 1000 MG CAPS Take 1,000 mg by mouth daily.    Marland Kitchen saccharomyces boulardii (FLORASTOR) 250 MG capsule Take 250 mg by mouth 2 (two) times daily.    . vitamin A 10000 UNIT capsule 1 capsule with food or milk    . Omeprazole (RA OMEPRAZOLE) 20 MG TBEC Take 20 mg by mouth.     No current facility-administered medications for this visit.     SURGICAL HISTORY:  Past Surgical History:  Procedure  Laterality Date  . CHEST TUBE INSERTION Right 08/02/2015   Procedure: INSERTION OF SECOND RIGHT CHEST TUBE WITH FLUROSCOPY;  Surgeon: Grace Isaac, MD;  Location: [facility];  Service: Thoracic;  Laterality: Right;  . CHEST TUBE INSERTION  07/28/2015  . COLONOSCOPY W/ BIOPSIES AND POLYPECTOMY    . HERNIA REPAIR  2011  . INGUINAL HERNIA REPAIR Left 07/15/2015   Procedure: OPEN LEFT INGUINAL HERNIA REPAIR;  Surgeon: Johnathan Hausen, MD;  [facility];  Service: General;  Laterality: Left;  With MESH  . TONSILLECTOMY    . VIDEO BRONCHOSCOPY WITH ENDOBRONCHIAL ULTRASOUND N/A 04/18/2015   Procedure: VIDEO BRONCHOSCOPY WITH ENDOBRONCHIAL ULTRASOUND;  Surgeon: Grace Isaac, MD;  Location: [facility];  Service: Thoracic;  Laterality: N/A;  . VIDEO BRONCHOSCOPY WITH INSERTION OF INTERBRONCHIAL VALVE (IBV) N/A 08/02/2015   Procedure: VIDEO BRONCHOSCOPY WITH ATTEMPTED INSERTION OF INTERBRONCHIAL VALVE (IBV) WITH FLUROSCOPY;  Surgeon: Grace Isaac, MD;  [facility];  Service: Thoracic;  Laterality: N/A;  . WISDOM TOOTH EXTRACTION      REVIEW OF SYSTEMS:  A comprehensive review of systems was negative except for: Constitutional: positive for fatigue Ears, nose, mouth, throat, and face: positive for hoarseness Respiratory: positive for cough, dyspnea on exertion and wheezing   PHYSICAL EXAMINATION: General appearance: alert, cooperative and no distress Head: Normocephalic, without obvious abnormality, atraumatic Neck: no adenopathy, no JVD, supple, symmetrical, trachea midline and thyroid not enlarged, symmetric, no tenderness/mass/nodules Lymph nodes: Cervical, supraclavicular, and axillary nodes normal. Resp: clear to auscultation bilaterally Back: symmetric, no curvature. ROM normal. No CVA tenderness. Cardio: regular rate and rhythm, S1, S2 normal, no murmur, click, rub or gallop GI: soft, non-tender; bowel sounds normal; no masses,  no organomegaly Extremities: extremities normal,  atraumatic, no cyanosis or edema Neurologic: Alert and oriented X 3, normal strength and tone. Normal symmetric reflexes. Normal coordination and gait  ECOG PERFORMANCE STATUS: 1 - Symptomatic but completely ambulatory  Blood pressure (!) 157/79, pulse (!) 18, temperature 97.5 F (36.4 C), temperature source Oral, resp. rate 18, height _0  (1.778 m), weight 123 lb 12.8 oz (56.2 kg), SpO2 99 %.  LABORATORY DATA: Lab Results  Component Value Date   WBC 10.8 (H) 08/20/2016   HGB 15.1 08/20/2016   HCT 45.9 08/20/2016   MCV 89.8 08/20/2016   PLT 260 08/20/2016      Chemistry      Component Value Date/Time   NA 140 08/20/2016 1159   K 4.0 08/20/2016 1159   CL 102 09/12/2015 0410   CO2 25 08/20/2016 1159   BUN 27.3 (H) 08/20/2016 1159   CREATININE 1.1 08/20/2016 1159      Component Value Date/Time   CALCIUM 10.0 08/20/2016 1159   ALKPHOS 122 08/20/2016 1159  AST 21 08/20/2016 1159   ALT 23 08/20/2016 1159   BILITOT 0.26 08/20/2016 1159       RADIOGRAPHIC STUDIES: No results found.  ASSESSMENT AND PLAN: This is a very pleasant 75 years old white male with stage IIIA non-small cell lung cancer. He is status post a course of concurrent chemoradiation with weekly carboplatin and paclitaxel with partial response. This was followed by 1 cycle of consolidation chemotherapy with carboplatin and gemcitabine but this was discontinued secondary to right pneumothorax and frequent hospitalization. The patient has been on observation for the last few months. The recent CT scan of the chest showed improvement of the right hemithorax but there was enlarging subsided and new/enlarging soft tissue nodule in the left lung suspicious for metastatic disease. The patient is currently undergoing treatment with immunotherapy with Nivolumab status post 15 cycles. He tolerated his last treatment fairly well. I recommended for him to continue his treatment with immunotherapy and he will proceed with  cycle #16 today. For pain management, he will continue on his current pain medication. For the mild anemia, he will continue taking over-the-counter iron tablets 1-2 tablets every day with vitamin C or orange juice. For hypertension, I advised the patient to check his blood pressure regularly at home and if continues to be elevated to consult with his primary care physician for evaluation and treatment. The patient would come back for follow-up visit in 2 weeks for reevaluation with repeat blood work before starting cycle #17 after repeating CT scan of the chest for restaging of his disease. The patient was advised to call immediately if he has any concerning symptoms in the interval. He was advised to call immediately if he has any concerning symptoms in the interval. The patient voices understanding of current disease status and treatment options and is in agreement with the current care plan.  All questions were answered. The patient knows to call the clinic with any problems, questions or concerns. We can certainly see the patient much sooner if necessary.  Disclaimer: This note was dictated with voice recognition software. Similar sounding words can inadvertently be transcribed and may not be corrected upon review.

## 2016-08-31 ENCOUNTER — Ambulatory Visit (HOSPITAL_COMMUNITY)
Admission: RE | Admit: 2016-08-31 | Discharge: 2016-08-31 | Disposition: A | Discharge status: Home / Self Care | Payer: 59 | Source: Ambulatory Visit | Attending: Internal Medicine | Admitting: Internal Medicine

## 2016-08-31 DIAGNOSIS — D3501 Benign neoplasm of right adrenal gland: Secondary | ICD-10-CM | POA: Diagnosis not present

## 2016-08-31 DIAGNOSIS — C3491 Malignant neoplasm of unspecified part of right bronchus or lung: Secondary | ICD-10-CM | POA: Diagnosis not present

## 2016-08-31 DIAGNOSIS — I2510 Atherosclerotic heart disease of native coronary artery without angina pectoris: Secondary | ICD-10-CM | POA: Insufficient documentation

## 2016-08-31 DIAGNOSIS — I700 Atherosclerosis of aorta: Secondary | ICD-10-CM | POA: Diagnosis not present

## 2016-08-31 DIAGNOSIS — R918 Other nonspecific abnormal finding of lung field: Secondary | ICD-10-CM | POA: Diagnosis not present

## 2016-08-31 DIAGNOSIS — N400 Enlarged prostate without lower urinary tract symptoms: Secondary | ICD-10-CM | POA: Diagnosis not present

## 2016-08-31 DIAGNOSIS — J479 Bronchiectasis, uncomplicated: Secondary | ICD-10-CM | POA: Diagnosis not present

## 2016-08-31 DIAGNOSIS — K5730 Diverticulosis of large intestine without perforation or abscess without bleeding: Secondary | ICD-10-CM | POA: Diagnosis not present

## 2016-08-31 DIAGNOSIS — J439 Emphysema, unspecified: Secondary | ICD-10-CM | POA: Diagnosis not present

## 2016-08-31 DIAGNOSIS — K4090 Unilateral inguinal hernia, without obstruction or gangrene, not specified as recurrent: Secondary | ICD-10-CM | POA: Diagnosis not present

## 2016-08-31 MED ORDER — IOPAMIDOL (ISOVUE-300) INJECTION 61%
100.0000 mL | Freq: Once | INTRAVENOUS | Status: AC | PRN
  Administered 2016-08-31: 100 mL via INTRAVENOUS

## 2016-09-03 ENCOUNTER — Encounter: Payer: Self-pay | Admitting: Internal Medicine

## 2016-09-03 ENCOUNTER — Other Ambulatory Visit (HOSPITAL_BASED_OUTPATIENT_CLINIC_OR_DEPARTMENT_OTHER): Payer: 59

## 2016-09-03 ENCOUNTER — Ambulatory Visit (HOSPITAL_BASED_OUTPATIENT_CLINIC_OR_DEPARTMENT_OTHER): Payer: 59 | Admitting: Internal Medicine

## 2016-09-03 ENCOUNTER — Ambulatory Visit (HOSPITAL_BASED_OUTPATIENT_CLINIC_OR_DEPARTMENT_OTHER): Payer: 59

## 2016-09-03 ENCOUNTER — Telehealth: Payer: Self-pay | Admitting: Internal Medicine

## 2016-09-03 VITALS — BP 164/79 | HR 96 | Temp 98.0°F | Resp 17 | Ht 70.0 in | Wt 123.5 lb

## 2016-09-03 DIAGNOSIS — C3411 Malignant neoplasm of upper lobe, right bronchus or lung: Secondary | ICD-10-CM

## 2016-09-03 DIAGNOSIS — Z5112 Encounter for antineoplastic immunotherapy: Secondary | ICD-10-CM | POA: Diagnosis not present

## 2016-09-03 DIAGNOSIS — R52 Pain, unspecified: Secondary | ICD-10-CM

## 2016-09-03 DIAGNOSIS — R0609 Other forms of dyspnea: Secondary | ICD-10-CM

## 2016-09-03 DIAGNOSIS — C3491 Malignant neoplasm of unspecified part of right bronchus or lung: Secondary | ICD-10-CM | POA: Diagnosis not present

## 2016-09-03 DIAGNOSIS — I10 Essential (primary) hypertension: Secondary | ICD-10-CM

## 2016-09-03 DIAGNOSIS — D649 Anemia, unspecified: Secondary | ICD-10-CM | POA: Diagnosis not present

## 2016-09-03 DIAGNOSIS — Z79899 Other long term (current) drug therapy: Secondary | ICD-10-CM | POA: Diagnosis not present

## 2016-09-03 DIAGNOSIS — R05 Cough: Secondary | ICD-10-CM

## 2016-09-03 DIAGNOSIS — R530 Neoplastic (malignant) related fatigue: Secondary | ICD-10-CM

## 2016-09-03 LAB — COMPREHENSIVE METABOLIC PANEL
ALBUMIN: 3.5 g/dL (ref 3.5–5.0)
ALK PHOS: 121 U/L (ref 40–150)
ALT: 20 U/L (ref 0–55)
AST: 22 U/L (ref 5–34)
Anion Gap: 10 mEq/L (ref 3–11)
BILIRUBIN TOTAL: 0.26 mg/dL (ref 0.20–1.20)
BUN: 31.4 mg/dL — AB (ref 7.0–26.0)
CO2: 25 mEq/L (ref 22–29)
Calcium: 10.1 mg/dL (ref 8.4–10.4)
Chloride: 105 mEq/L (ref 98–109)
Creatinine: 1 mg/dL (ref 0.7–1.3)
EGFR: 77 mL/min/{1.73_m2} — AB (ref >90)
GLUCOSE: 92 mg/dL (ref 70–140)
Potassium: 3.9 mEq/L (ref 3.5–5.1)
SODIUM: 139 meq/L (ref 136–145)
TOTAL PROTEIN: 8.2 g/dL (ref 6.4–8.3)

## 2016-09-03 LAB — CBC WITH DIFFERENTIAL/PLATELET
BASO%: 0.5 % (ref 0.0–2.0)
Basophils Absolute: 0.1 10*3/uL (ref 0.0–0.1)
EOS ABS: 0.2 10*3/uL (ref 0.0–0.5)
EOS%: 1.7 % (ref 0.0–7.0)
HCT: 44.1 % (ref 38.4–49.9)
HEMOGLOBIN: 14.3 g/dL (ref 13.0–17.1)
LYMPH#: 0.9 10*3/uL (ref 0.9–3.3)
LYMPH%: 7.8 % — ABNORMAL LOW (ref 14.0–49.0)
MCH: 29 pg (ref 27.2–33.4)
MCHC: 32.3 g/dL (ref 32.0–36.0)
MCV: 89.7 fL (ref 79.3–98.0)
MONO#: 0.7 10*3/uL (ref 0.1–0.9)
MONO%: 6.1 % (ref 0.0–14.0)
NEUT%: 83.9 % — ABNORMAL HIGH (ref 39.0–75.0)
NEUTROS ABS: 9.4 10*3/uL — AB (ref 1.5–6.5)
Platelets: 233 10*3/uL (ref 140–400)
RBC: 4.92 10*6/uL (ref 4.20–5.82)
RDW: 14.4 % (ref 11.0–14.6)
WBC: 11.3 10*3/uL — AB (ref 4.0–10.3)

## 2016-09-03 LAB — TSH: TSH: 0.814 m(IU)/L (ref 0.320–4.118)

## 2016-09-03 MED ORDER — NIVOLUMAB CHEMO INJECTION 100 MG/10ML
240.0000 mg | Freq: Once | INTRAVENOUS | Status: AC
  Administered 2016-09-03: 240 mg via INTRAVENOUS
  Filled 2016-09-03: qty 20

## 2016-09-03 MED ORDER — SODIUM CHLORIDE 0.9 % IV SOLN
Freq: Once | INTRAVENOUS | Status: AC
  Administered 2016-09-03: 15:00:00 via INTRAVENOUS

## 2016-09-03 NOTE — Patient Instructions (Signed)
[facility] Discharge Instructions for Patients Receiving Chemotherapy  Today you received the following chemotherapy agents:  Nivolumab.  To help prevent nausea and vomiting after your treatment, we encourage you to take your nausea medication as directed.   If you develop nausea and vomiting that is not controlled by your nausea medication, call the clinic.   BELOW ARE SYMPTOMS THAT SHOULD BE REPORTED IMMEDIATELY:  *FEVER GREATER THAN 100.5 F  *CHILLS WITH OR WITHOUT FEVER  NAUSEA AND VOMITING THAT IS NOT CONTROLLED WITH YOUR NAUSEA MEDICATION  *UNUSUAL SHORTNESS OF BREATH  *UNUSUAL BRUISING OR BLEEDING  TENDERNESS IN MOUTH AND THROAT WITH OR WITHOUT PRESENCE OF ULCERS  *URINARY PROBLEMS  *BOWEL PROBLEMS  UNUSUAL RASH Items with * indicate a potential emergency and should be followed up as soon as possible.  Feel free to call the clinic you have any questions or concerns. The clinic phone number is [phone].  Please show the CHEMO ALERT CARD at check-in to the Emergency Department and triage nurse.   

## 2016-09-03 NOTE — Telephone Encounter (Signed)
Message sent to chemo scheduler to add chemo, per 09/03/16 los. Appointments scheduled per 09/03/16 los. AVS report and appointment schedule given to patient, per 09/03/16 los.

## 2016-09-03 NOTE — Progress Notes (Signed)
[facility] [phone]   [phone]  OFFICE PROGRESS NOTE  Cammy Copa, MD [phone] [address], [address] [place] [address]  DIAGNOSIS: Non-small cell carcinoma of right lung, Adenocarcinoma, stage 3  Staging form: Lung, AJCC 7th Edition  Clinical stage from 04/21/2015: Stage IIIA (T2a, N2, M0) - Signed by Curt Bears, MD on 04/21/2015  [facility] One molecular studies reveal that he was positive for [place], [place], and STAG2 [id number]. He was negative for: EGFR, ALK, BRAF, MET, RET, ERBB2 and ROS1  PRIOR THERAPY:  1) Concurrent chemoradiation with chemotherapy in the form of weekly carboplatin for AUC 2 paclitaxel 45 mg/m given concurrent with radiation therapy. First cycle started 05/09/2015. Status post 7 cycles. 2) Consolidation chemotherapy with carboplatin for AUC of 5 on day 1 and gemcitabine 1000 MG/M2 on days 1 and 8 every 3 weeks. First dose 08/15/2015. Discontinued secondary to intolerance.  CURRENT THERAPY: Immunotherapy with Nivolumab 240 MG IV every 2 weeks. First dose 01/18/2016. Status post 16 cycles.  INTERVAL HISTORY: Gabriel [id number]. male returns to the clinic today for follow-up visit accompanied by his daughter Gabriel Signsand son Gabriel [name] The patient has no complaints today except for persistent cough which is better in the last 2 days. He is tolerating his immunotherapy with Nivolumab fairly well with no significant adverse effects. He denied having any significant skin rash. He denied having any nausea, vomiting, diarrhea or constipation. He has no chest pain but continues to have shortness of breath with exertion with no hemoptysis. He denied having any fever or chills. He has no nausea or vomiting. He had repeat CT scan of the chest, abdomen and pelvis performed recently and he is here for evaluation and discussion of the scan results..Marland Kitchen MEDICAL HISTORY: Past Medical History:  Diagnosis Date    . [place]    skin cancer  . Constipation    AFTER ANESTHESIA  . Difficulty sleeping   . History of nonmelanoma skin cancer   . Inguinal hernia   . Lung mass    right / HAS HAD RADIATION AND CHEMO FOR LUNG CANCER  . Pneumothorax on right 08/24/2015  . Productive cough    "DUE TO RADIATION"  . Radiation 05/11/15-06/21/15   NSCLCA/right lung  . Spontaneous pneumothorax MANY YRS AGO AND AGAIN 04/04/15   HISTORY OF (ONLY)    ALLERGIES:  is allergic to aleve [naproxen].  MEDICATIONS:  Current Outpatient Prescriptions  Medication Sig Dispense Refill  . acetaminophen (TYLENOL) 325 MG tablet 2 tablets    . albuterol (PROVENTIL HFA) 108 (90 Base) MCG/ACT inhaler 1-2 puffs as needed    . Arginine (RA L-ARGININE) 1000 MG TABS Take 500 mg by mouth daily.     . Ascorbic Acid (VITAMIN C) 1000 MG tablet Take 1,000 mg by mouth daily. Pt takes 2 5085mtabs    . B Complex Vitamins (B COMPLEX PO) Take 1 capsule by mouth daily.     . budesonide-formoterol (SYMBICORT) 160-4.5 MCG/ACT inhaler 2 puffs BID 1 Inhaler 5  . CALCIUM CITRATE PO Take 1 tablet by mouth daily.    . Cyanocobalamin (VITAMIN B 12 PO) Take 1 tablet by mouth daily.    . ferrous sulfate 325 (65 FE) MG tablet Take 325 mg by mouth daily with breakfast.    . guaiFENesin (MUCINEX) 600 MG 12 hr tablet Take 1 tablet (600 mg total) by mouth 2 (two) times daily as needed.    .Marland Kitchen  magic mouthwash SOLN Take 5 mLs by mouth 4 (four) times daily as needed.    . magnesium 30 MG tablet Take 30 mg by mouth daily.    . Magnesium Gluconate 550 MG TABS Take by mouth.    . Multiple Vitamin (MULTI VITAMIN DAILY PO) Take 1 tablet by mouth daily.    . Omega-3 Fatty Acids (FISH OIL) 1000 MG CAPS Take 1,000 mg by mouth daily.    . Omeprazole (RA OMEPRAZOLE) 20 MG TBEC Take 20 mg by mouth.    . saccharomyces boulardii (FLORASTOR) 250 MG capsule Take 250 mg by mouth 2 (two) times daily.    . vitamin A 10000 UNIT capsule 1 capsule with food or milk     No  current facility-administered medications for this visit.     SURGICAL HISTORY:  Past Surgical History:  Procedure Laterality Date  . CHEST TUBE INSERTION Right 08/02/2015   Procedure: INSERTION OF SECOND RIGHT CHEST TUBE WITH FLUROSCOPY;  Surgeon: Grace Isaac, MD;  Location: [facility];  Service: Thoracic;  Laterality: Right;  . CHEST TUBE INSERTION  07/28/2015  . COLONOSCOPY W/ BIOPSIES AND POLYPECTOMY    . HERNIA REPAIR  2011  . INGUINAL HERNIA REPAIR Left 07/15/2015   Procedure: OPEN LEFT INGUINAL HERNIA REPAIR;  Surgeon: Johnathan Hausen, MD;  Location: [facility];  Service: General;  Laterality: Left;  With MESH  . TONSILLECTOMY    . VIDEO BRONCHOSCOPY WITH ENDOBRONCHIAL ULTRASOUND N/A 04/18/2015   Procedure: VIDEO BRONCHOSCOPY WITH ENDOBRONCHIAL ULTRASOUND;  Surgeon: Grace Isaac, MD;  Location: [facility];  Service: Thoracic;  Laterality: N/A;  . VIDEO BRONCHOSCOPY WITH INSERTION OF INTERBRONCHIAL VALVE (IBV) N/A 08/02/2015   Procedure: VIDEO BRONCHOSCOPY WITH ATTEMPTED INSERTION OF INTERBRONCHIAL VALVE (IBV) WITH FLUROSCOPY;  Surgeon: Grace Isaac, MD;  Location: [facility];  Service: Thoracic;  Laterality: N/A;  . WISDOM TOOTH EXTRACTION      REVIEW OF SYSTEMS:  Constitutional: negative Eyes: negative Ears, nose, mouth, throat, and face: negative Respiratory: positive for cough and sputum Cardiovascular: negative Gastrointestinal: negative Genitourinary:negative Integument/breast: negative Hematologic/lymphatic: negative Musculoskeletal:negative Neurological: negative Behavioral/Psych: negative Endocrine: negative Allergic/Immunologic: negative   PHYSICAL EXAMINATION: General appearance: alert, cooperative and no distress Head: Normocephalic, without obvious abnormality, atraumatic Neck: no adenopathy, no JVD, supple, symmetrical, trachea midline and thyroid not enlarged, symmetric, no tenderness/mass/nodules Lymph nodes: Cervical, supraclavicular, and axillary nodes  normal. Resp: clear to auscultation bilaterally Back: symmetric, no curvature. ROM normal. No CVA tenderness. Cardio: regular rate and rhythm, S1, S2 normal, no murmur, click, rub or gallop GI: soft, non-tender; bowel sounds normal; no masses,  no organomegaly Extremities: extremities normal, atraumatic, no cyanosis or edema Neurologic: Alert and oriented X 3, normal strength and tone. Normal symmetric reflexes. Normal coordination and gait  ECOG PERFORMANCE STATUS: 1 - Symptomatic but completely ambulatory  There were no vitals taken for this visit.  LABORATORY DATA: Lab Results  Component Value Date   WBC 11.3 (H) 09/03/2016   HGB 14.3 09/03/2016   HCT 44.1 09/03/2016   MCV 89.7 09/03/2016   PLT 233 09/03/2016      Chemistry      Component Value Date/Time   NA 140 08/20/2016 1159   K 4.0 08/20/2016 1159   CL 102 09/12/2015 0410   CO2 25 08/20/2016 1159   BUN 27.3 (H) 08/20/2016 1159   CREATININE 1.1 08/20/2016 1159      Component Value Date/Time   CALCIUM 10.0 08/20/2016 1159   ALKPHOS 122 08/20/2016 1159  AST 21 08/20/2016 1159   ALT 23 08/20/2016 1159   BILITOT 0.26 08/20/2016 1159       RADIOGRAPHIC STUDIES: Ct Chest W Contrast  Result Date: 09/01/2016 CLINICAL DATA:  Non-small cell (adenocarcinoma) carcinoma of the right lung, stage IIIA, diagnosed 04/21/2015 status post concurrent chemoradiation therapy and consolidation chemotherapy, with ongoing immunotherapy. Restaging. EXAM: CT CHEST, ABDOMEN, AND PELVIS WITH CONTRAST TECHNIQUE: Multidetector CT imaging of the chest, abdomen and pelvis was performed following the standard protocol during bolus administration of intravenous contrast. CONTRAST:  111m ISOVUE-300 IOPAMIDOL (ISOVUE-300) INJECTION 61% COMPARISON:  07/06/2016 chest CT. 03/12/2016 CT chest, abdomen and pelvis. FINDINGS: CT CHEST FINDINGS Cardiovascular: Normal heart size. No significant pericardial fluid/thickening. Left anterior descending coronary  atherosclerosis. Atherosclerotic nonaneurysmal thoracic aorta. Normal caliber pulmonary arteries. No central pulmonary emboli. Mediastinum/Nodes: No discrete thyroid nodules. Stable right deviation of the upper mediastinum. Stable mildly patulous upper thoracic esophagus. No axillary adenopathy. Mild right upper paratracheal adenopathy measuring up to 1.0 cm (series 2/ image 11), stable. No new pathologically enlarged mediastinal or hilar nodes. Lungs/Pleura: No pneumothorax. Stable pleural thickening throughout the right pleural space, most prominent in the upper right pleural space. No pleural effusions. Stable complete consolidation and severe volume loss in the right upper lobe with associated severe bronchiectasis. Moderate centrilobular and paraseptal emphysema with diffuse bronchial wall thickening. Central right lower lobe 2.1 x 1.5 cm nodule (series 2/ image 26), previously 2.0 x 1.5 cm, not appreciably changed. There is new patchy consolidation, ground-glass opacity and nodularity throughout the right lower lobe, with worsening right lower lobe volume loss, including a new 1.2 cm basilar right lower lobe nodule (series 6/ image 116). There are at least 6 scattered irregular pulmonary nodules throughout the left upper and left lower lobes, not appreciably changed. For example a subpleural anterior apical left upper lobe 1.4 x 1.1 cm nodule (series 6/image 11), previously 1.4 x 1.0 cm, stable. A solid 7 mm superior segment left lower lobe nodule (series 6/image 50), previously 7 mm, stable. The basilar left lower lobe subsolid 2.3 x 2.1 cm nodule (series 6/image 125), previously 2.3 x 2.1 cm using similar measurement technique, stable. Musculoskeletal: No aggressive appearing focal osseous lesions. Stable chronic mild T7 vertebral compression fracture. Mild-to-moderate thoracic spondylosis. CT ABDOMEN PELVIS FINDINGS Hepatobiliary: Normal liver with no liver mass. Normal gallbladder with no radiopaque  cholelithiasis. No biliary ductal dilatation. Pancreas: Normal, with no mass or duct dilation. Spleen: Normal size. No mass. 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 No left adrenal nodules. No hydronephrosis. No renal mass. Relatively collapsed and grossly normal bladder. Stomach/Bowel: Grossly normal stomach. A tiny portion of a right pelvic small bowel loop protrudes into a small right inguinal hernia. No small bowel dilatation, wall thickening, pneumatosis or focal caliber transition. Normal appendix. Mild distal colonic diverticulosis, with no large bowel wall thickening or pericolonic fat stranding. Vascular/Lymphatic: Atherosclerotic abdominal aorta. Stable ectasia of the infrarenal abdominal aorta, maximum diameter 2.5 cm. Patent portal, splenic, hepatic and renal veins. No pathologically enlarged lymph nodes in the abdomen or pelvis. Reproductive: Stable mildly enlarged prostate with nonspecific internal prostatic calcifications. Other: No pneumoperitoneum, ascites or focal fluid collection. Musculoskeletal: No aggressive appearing focal osseous lesions. Stable scattered small sclerotic lesions in the bilateral pelvic girdle, favor benign bone islands. Marked lumbar spondylosis. IMPRESSION: 1. Central right lower lobe neoplasm is stable. 2. New patchy and nodular consolidation and worsening volume loss in the right lower lobe, favor infection or aspiration given the  multifocality and patchy appearance, although new right lower lobe metastatic disease is not excluded. Consider a trial of antibiotic therapy. Short-term follow-up post treatment chest CT is recommended in 3 months. 3. Several irregular pulmonary nodules throughout the left lung are stable, suspicious for metastases and/or synchronous primary bronchogenic malignancies. 4. Stable severe volume loss, consolidation and bronchiectasis in the right upper lobe, most consistent with post treatment change. 5.  Stable mild right upper paratracheal lymphadenopathy. No new sites of adenopathy. 6. No metastatic disease in the abdomen or pelvis. 7. Small right inguinal hernia containing a tiny portion of a right pelvic small bowel loop. No evidence of bowel obstruction or ischemia. 8. Additional findings include aortic atherosclerosis, coronary atherosclerosis, moderate emphysema, right adrenal adenoma, mild distal colonic diverticulosis and mild prostatomegaly. Electronically Signed   By: Ilona Sorrel M.D.   On: 09/01/2016 12:33   Ct Abdomen Pelvis W Contrast  Result Date: 09/01/2016 CLINICAL DATA:  Non-small cell (adenocarcinoma) carcinoma of the right lung, stage IIIA, diagnosed 04/21/2015 status post concurrent chemoradiation therapy and consolidation chemotherapy, with ongoing immunotherapy. Restaging. EXAM: CT CHEST, ABDOMEN, AND PELVIS WITH CONTRAST TECHNIQUE: Multidetector CT imaging of the chest, abdomen and pelvis was performed following the standard protocol during bolus administration of intravenous contrast. CONTRAST:  175m ISOVUE-300 IOPAMIDOL (ISOVUE-300) INJECTION 61% COMPARISON:  07/06/2016 chest CT. 03/12/2016 CT chest, abdomen and pelvis. FINDINGS: CT CHEST FINDINGS Cardiovascular: Normal heart size. No significant pericardial fluid/thickening. Left anterior descending coronary atherosclerosis. Atherosclerotic nonaneurysmal thoracic aorta. Normal caliber pulmonary arteries. No central pulmonary emboli. Mediastinum/Nodes: No discrete thyroid nodules. Stable right deviation of the upper mediastinum. Stable mildly patulous upper thoracic esophagus. No axillary adenopathy. Mild right upper paratracheal adenopathy measuring up to 1.0 cm (series 2/ image 11), stable. No new pathologically enlarged mediastinal or hilar nodes. Lungs/Pleura: No pneumothorax. Stable pleural thickening throughout the right pleural space, most prominent in the upper right pleural space. No pleural effusions. Stable complete  consolidation and severe volume loss in the right upper lobe with associated severe bronchiectasis. Moderate centrilobular and paraseptal emphysema with diffuse bronchial wall thickening. Central right lower lobe 2.1 x 1.5 cm nodule (series 2/ image 26), previously 2.0 x 1.5 cm, not appreciably changed. There is new patchy consolidation, ground-glass opacity and nodularity throughout the right lower lobe, with worsening right lower lobe volume loss, including a new 1.2 cm basilar right lower lobe nodule (series 6/ image 116). There are at least 6 scattered irregular pulmonary nodules throughout the left upper and left lower lobes, not appreciably changed. For example a subpleural anterior apical left upper lobe 1.4 x 1.1 cm nodule (series 6/image 11), previously 1.4 x 1.0 cm, stable. A solid 7 mm superior segment left lower lobe nodule (series 6/image 50), previously 7 mm, stable. The basilar left lower lobe subsolid 2.3 x 2.1 cm nodule (series 6/image 125), previously 2.3 x 2.1 cm using similar measurement technique, stable. Musculoskeletal: No aggressive appearing focal osseous lesions. Stable chronic mild T7 vertebral compression fracture. Mild-to-moderate thoracic spondylosis. CT ABDOMEN PELVIS FINDINGS Hepatobiliary: Normal liver with no liver mass. Normal gallbladder with no radiopaque cholelithiasis. No biliary ductal dilatation. Pancreas: Normal, with no mass or duct dilation. Spleen: Normal size. No mass. 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 No left adrenal nodules. No hydronephrosis. No renal mass. Relatively collapsed and grossly normal bladder. Stomach/Bowel: Grossly normal stomach. A tiny portion of a right pelvic small bowel loop protrudes into a small right inguinal hernia. No small bowel dilatation, wall  thickening, pneumatosis or focal caliber transition. Normal appendix. Mild distal colonic diverticulosis, with no large bowel wall thickening or  pericolonic fat stranding. Vascular/Lymphatic: Atherosclerotic abdominal aorta. Stable ectasia of the infrarenal abdominal aorta, maximum diameter 2.5 cm. Patent portal, splenic, hepatic and renal veins. No pathologically enlarged lymph nodes in the abdomen or pelvis. Reproductive: Stable mildly enlarged prostate with nonspecific internal prostatic calcifications. Other: No pneumoperitoneum, ascites or focal fluid collection. Musculoskeletal: No aggressive appearing focal osseous lesions. Stable scattered small sclerotic lesions in the bilateral pelvic girdle, favor benign bone islands. Marked lumbar spondylosis. IMPRESSION: 1. Central right lower lobe neoplasm is stable. 2. New patchy and nodular consolidation and worsening volume loss in the right lower lobe, favor infection or aspiration given the multifocality and patchy appearance, although new right lower lobe metastatic disease is not excluded. Consider a trial of antibiotic therapy. Short-term follow-up post treatment chest CT is recommended in 3 months. 3. Several irregular pulmonary nodules throughout the left lung are stable, suspicious for metastases and/or synchronous primary bronchogenic malignancies. 4. Stable severe volume loss, consolidation and bronchiectasis in the right upper lobe, most consistent with post treatment change. 5. Stable mild right upper paratracheal lymphadenopathy. No new sites of adenopathy. 6. No metastatic disease in the abdomen or pelvis. 7. Small right inguinal hernia containing a tiny portion of a right pelvic small bowel loop. No evidence of bowel obstruction or ischemia. 8. Additional findings include aortic atherosclerosis, coronary atherosclerosis, moderate emphysema, right adrenal adenoma, mild distal colonic diverticulosis and mild prostatomegaly. Electronically Signed   By: Ilona Sorrel M.D.   On: 09/01/2016 12:33    ASSESSMENT AND PLAN: This is a very pleasant 75 years old white male with stage IIIA non-small cell  lung cancer. He is status post a course of concurrent chemoradiation with weekly carboplatin and paclitaxel with partial response. This was followed by 1 cycle of consolidation chemotherapy with carboplatin and gemcitabine but this was discontinued secondary to right pneumothorax and frequent hospitalization. The patient has been on observation for the last few months. The recent CT scan of the chest showed improvement of the right hemithorax but there was enlarging subsided and new/enlarging soft tissue nodule in the left lung suspicious for metastatic disease. The patient is currently undergoing treatment with immunotherapy with Nivolumab status post 16 cycles. He tolerated his last treatment fairly well. The recent CT scan of the chest, abdomen and pelvis showed no concerning findings for disease progression. I discussed the scan results with the patient today. I recommended for him to continue his treatment with immunotherapy and he will proceed with cycle #17 today. For pain management, he will continue on his current pain medication. For the mild anemia, he will continue taking over-the-counter iron tablets 1-2 tablets every day with vitamin C or orange juice. For hypertension, I advised the patient to check his blood pressure regularly at home and if continues to be elevated to consult with his primary care physician for evaluation and treatment. The patient would come back for follow-up visit in 2 weeks for reevaluation with repeat blood work before starting cycle #18. The patient was advised to call immediately if he has any concerning symptoms in the interval. He was advised to call immediately if he has any concerning symptoms in the interval. The patient voices understanding of current disease status and treatment options and is in agreement with the current care plan.  All questions were answered. The patient knows to call the clinic with any problems, questions or concerns. We  can  certainly see the patient much sooner if necessary.  Disclaimer: This note was dictated with voice recognition software. Similar sounding words can inadvertently be transcribed and may not be corrected upon review.

## 2016-09-04 ENCOUNTER — Telehealth: Payer: Self-pay | Admitting: *Deleted

## 2016-09-04 NOTE — Telephone Encounter (Signed)
Per LOS I have scheduled appts and notified the scheduler 

## 2016-09-17 ENCOUNTER — Other Ambulatory Visit (HOSPITAL_BASED_OUTPATIENT_CLINIC_OR_DEPARTMENT_OTHER): Payer: 59

## 2016-09-17 ENCOUNTER — Ambulatory Visit (HOSPITAL_BASED_OUTPATIENT_CLINIC_OR_DEPARTMENT_OTHER): Payer: 59 | Admitting: Internal Medicine

## 2016-09-17 ENCOUNTER — Ambulatory Visit (HOSPITAL_BASED_OUTPATIENT_CLINIC_OR_DEPARTMENT_OTHER): Payer: 59

## 2016-09-17 ENCOUNTER — Encounter: Payer: Self-pay | Admitting: Internal Medicine

## 2016-09-17 VITALS — BP 153/74 | HR 102 | Temp 97.5°F | Resp 18 | Ht 70.0 in | Wt 121.7 lb

## 2016-09-17 DIAGNOSIS — Z23 Encounter for immunization: Secondary | ICD-10-CM

## 2016-09-17 DIAGNOSIS — R630 Anorexia: Secondary | ICD-10-CM

## 2016-09-17 DIAGNOSIS — F17210 Nicotine dependence, cigarettes, uncomplicated: Secondary | ICD-10-CM

## 2016-09-17 DIAGNOSIS — Z5112 Encounter for antineoplastic immunotherapy: Secondary | ICD-10-CM

## 2016-09-17 DIAGNOSIS — R52 Pain, unspecified: Secondary | ICD-10-CM | POA: Diagnosis not present

## 2016-09-17 DIAGNOSIS — R530 Neoplastic (malignant) related fatigue: Secondary | ICD-10-CM

## 2016-09-17 DIAGNOSIS — C3491 Malignant neoplasm of unspecified part of right bronchus or lung: Secondary | ICD-10-CM | POA: Diagnosis not present

## 2016-09-17 DIAGNOSIS — M799 Soft tissue disorder, unspecified: Secondary | ICD-10-CM | POA: Diagnosis not present

## 2016-09-17 DIAGNOSIS — D649 Anemia, unspecified: Secondary | ICD-10-CM

## 2016-09-17 DIAGNOSIS — I10 Essential (primary) hypertension: Secondary | ICD-10-CM

## 2016-09-17 DIAGNOSIS — R05 Cough: Secondary | ICD-10-CM

## 2016-09-17 DIAGNOSIS — R0609 Other forms of dyspnea: Secondary | ICD-10-CM

## 2016-09-17 DIAGNOSIS — Z720 Tobacco use: Secondary | ICD-10-CM

## 2016-09-17 HISTORY — DX: Nicotine dependence, cigarettes, uncomplicated: F17.210

## 2016-09-17 LAB — CBC WITH DIFFERENTIAL/PLATELET
BASO%: 0.5 % (ref 0.0–2.0)
Basophils Absolute: 0.1 10*3/uL (ref 0.0–0.1)
EOS%: 1.2 % (ref 0.0–7.0)
Eosinophils Absolute: 0.1 10*3/uL (ref 0.0–0.5)
HEMATOCRIT: 43.5 % (ref 38.4–49.9)
HGB: 14.3 g/dL (ref 13.0–17.1)
LYMPH#: 1 10*3/uL (ref 0.9–3.3)
LYMPH%: 9 % — ABNORMAL LOW (ref 14.0–49.0)
MCH: 29.2 pg (ref 27.2–33.4)
MCHC: 32.9 g/dL (ref 32.0–36.0)
MCV: 88.8 fL (ref 79.3–98.0)
MONO#: 0.7 10*3/uL (ref 0.1–0.9)
MONO%: 7 % (ref 0.0–14.0)
NEUT#: 8.8 10*3/uL — ABNORMAL HIGH (ref 1.5–6.5)
NEUT%: 82.3 % — AB (ref 39.0–75.0)
Platelets: 283 10*3/uL (ref 140–400)
RBC: 4.9 10*6/uL (ref 4.20–5.82)
RDW: 13.9 % (ref 11.0–14.6)
WBC: 10.7 10*3/uL — ABNORMAL HIGH (ref 4.0–10.3)

## 2016-09-17 LAB — COMPREHENSIVE METABOLIC PANEL
ALBUMIN: 3.5 g/dL (ref 3.5–5.0)
ALT: 19 U/L (ref 0–55)
AST: 18 U/L (ref 5–34)
Alkaline Phosphatase: 116 U/L (ref 40–150)
Anion Gap: 9 mEq/L (ref 3–11)
BUN: 33 mg/dL — ABNORMAL HIGH (ref 7.0–26.0)
CALCIUM: 10 mg/dL (ref 8.4–10.4)
CHLORIDE: 105 meq/L (ref 98–109)
CO2: 26 meq/L (ref 22–29)
Creatinine: 1 mg/dL (ref 0.7–1.3)
EGFR: 76 mL/min/{1.73_m2} — AB (ref >90)
Glucose: 115 mg/dl (ref 70–140)
POTASSIUM: 4.4 meq/L (ref 3.5–5.1)
SODIUM: 140 meq/L (ref 136–145)
TOTAL PROTEIN: 7.8 g/dL (ref 6.4–8.3)
Total Bilirubin: <0.22 mg/dL (ref 0.20–1.20)

## 2016-09-17 MED ORDER — SODIUM CHLORIDE 0.9 % IV SOLN
240.0000 mg | Freq: Once | INTRAVENOUS | Status: AC
  Administered 2016-09-17: 240 mg via INTRAVENOUS
  Filled 2016-09-17: qty 20

## 2016-09-17 MED ORDER — INFLUENZA VAC SPLIT QUAD 0.5 ML IM SUSY
0.5000 mL | PREFILLED_SYRINGE | Freq: Once | INTRAMUSCULAR | Status: AC
  Administered 2016-09-17: 0.5 mL via INTRAMUSCULAR
  Filled 2016-09-17: qty 0.5

## 2016-09-17 MED ORDER — SODIUM CHLORIDE 0.9 % IV SOLN
Freq: Once | INTRAVENOUS | Status: AC
  Administered 2016-09-17: 16:00:00 via INTRAVENOUS

## 2016-09-17 NOTE — Patient Instructions (Addendum)
Steps to Quit Smoking Smoking tobacco can be bad for your health. It can also affect almost every organ in your body. Smoking puts you and people around you at risk for many serious long-lasting (chronic) diseases. Quitting smoking is hard, but it is one of the best things that you can do for your health. It is never too late to quit. What are the benefits of quitting smoking? When you quit smoking, you lower your risk for getting serious diseases and conditions. They can include:  Lung cancer or lung disease.  Heart disease.  Stroke.  Heart attack.  Not being able to have children (infertility).  Weak bones (osteoporosis) and broken bones (fractures). If you have coughing, wheezing, and shortness of breath, those symptoms may get better when you quit. You may also get sick less often. If you are pregnant, quitting smoking can help to lower your chances of having a baby of low birth weight. What can I do to help me quit smoking? Talk with your doctor about what can help you quit smoking. Some things you can do (strategies) include:  Quitting smoking totally, instead of slowly cutting back how much you smoke over a period of time.  Going to in-person counseling. You are more likely to quit if you go to many counseling sessions.  Using resources and support systems, such as:  Online chats with a counselor.  Phone quitlines.  Printed self-help materials.  Support groups or group counseling.  Text messaging programs.  Mobile phone apps or applications.  Taking medicines. Some of these medicines may have nicotine in them. If you are pregnant or breastfeeding, do not take any medicines to quit smoking unless your doctor says it is okay. Talk with your doctor about counseling or other things that can help you. Talk with your doctor about using more than one strategy at the same time, such as taking medicines while you are also going to in-person counseling. This can help make quitting  easier. What things can I do to make it easier to quit? Quitting smoking might feel very hard at first, but there is a lot that you can do to make it easier. Take these steps:  Talk to your family and friends. Ask them to support and encourage you.  Call phone quitlines, reach out to support groups, or work with a counselor.  Ask people who smoke to not smoke around you.  Avoid places that make you want (trigger) to smoke, such as:  Bars.  Parties.  Smoke-break areas at work.  Spend time with people who do not smoke.  Lower the stress in your life. Stress can make you want to smoke. Try these things to help your stress:  Getting regular exercise.  Deep-breathing exercises.  Yoga.  Meditating.  Doing a body scan. To do this, close your eyes, focus on one area of your body at a time from head to toe, and notice which parts of your body are tense. Try to relax the muscles in those areas.  Download or buy apps on your mobile phone or tablet that can help you stick to your quit plan. There are many free apps, such as QuitGuide from the CDC (Centers for Disease Control and Prevention). You can find more support from smokefree.gov and other websites. This information is not intended to replace advice given to you by your health care provider. Make sure you discuss any questions you have with your health care provider. Document Released: 08/11/2009 Document Revised: 06/12/2016 Document   Reviewed: 03/01/2015 [employer]vier Interactive Patient Education  2017 [employer]  

## 2016-09-17 NOTE — Progress Notes (Signed)
[place] [phone]   [phone]  OFFICE PROGRESS NOTE  Cammy Copa, MD [phone] [address], [address] [place] [address]  DIAGNOSIS: Non-small cell carcinoma of right lung, Adenocarcinoma, stage 3  Staging form: Lung, AJCC 7th Edition  Clinical stage from 04/21/2015: Stage IIIA (T2a, N2, M0) - Signed by Curt Bears, MD on 04/21/2015  [facility] molecular studies reveal that he was positive for [place], [place], and STAG2 [id number]. He was negative for: EGFR, ALK, BRAF, MET, RET, ERBB2 and ROS1  PRIOR THERAPY:  1) Concurrent chemoradiation with chemotherapy in the form of weekly carboplatin for AUC 2 paclitaxel 45 mg/m given concurrent with radiation therapy. First cycle started 05/09/2015. Status post 7 cycles. 2) Consolidation chemotherapy with carboplatin for AUC of 5 on day 1 and gemcitabine 1000 MG/M2 on days 1 and 8 every 3 weeks. First dose 08/15/2015. Discontinued secondary to intolerance.  CURRENT THERAPY: Immunotherapy with Nivolumab 240 MG IV every 2 weeks. First dose 01/18/2016. Status post 17 cycles.  INTERVAL HISTORY: Gabriel [id number]. male returns to the clinic today for follow-up visit accompanied by his daughter [name] The patient has no complaints today except for persistent cough. Unfortunately continues to smoke 0.5 pack per day. He is tolerating his immunotherapy with Nivolumab fairly well with no significant adverse effects. He denied having any significant skin rash. He denied having any nausea, vomiting, diarrhea or constipation. He has no chest pain but continues to have shortness of breath with exertion with no hemoptysis. He denied having any fever or chills. He has no nausea or vomiting. He lost few pounds since his last visit. He is here today to start cycle #18.  MEDICAL HISTORY: Past Medical History:  Diagnosis Date  . Cancer ([place]    skin cancer  . Constipation    AFTER  ANESTHESIA  . Difficulty sleeping   . History of nonmelanoma skin cancer   . Inguinal hernia   . Lung mass    right / HAS HAD RADIATION AND CHEMO FOR LUNG CANCER  . Pneumothorax on right 08/24/2015  . Productive cough    "DUE TO RADIATION"  . Radiation 05/11/15-06/21/15   NSCLCA/right lung  . Spontaneous pneumothorax MANY YRS AGO AND AGAIN 04/04/15   HISTORY OF (ONLY)    ALLERGIES:  is allergic to aleve [naproxen].  MEDICATIONS:  Current Outpatient Prescriptions  Medication Sig Dispense Refill  . acetaminophen (TYLENOL) 325 MG tablet 2 tablets    . albuterol (PROVENTIL HFA) 108 (90 Base) MCG/ACT inhaler 1-2 puffs as needed    . Arginine (RA L-ARGININE) 1000 MG TABS Take 500 mg by mouth daily.     . Ascorbic Acid (VITAMIN C) 1000 MG tablet Take 1,000 mg by mouth daily. Pt takes 2 5066mtabs    . B Complex Vitamins (B COMPLEX PO) Take 1 capsule by mouth daily.     . budesonide-formoterol (SYMBICORT) 160-4.5 MCG/ACT inhaler 2 puffs BID 1 Inhaler 5  . CALCIUM CITRATE PO Take 1 tablet by mouth daily.    . Cyanocobalamin (VITAMIN B 12 PO) Take 1 tablet by mouth daily.    . ferrous sulfate 325 (65 FE) MG tablet Take 325 mg by mouth daily with breakfast.    . guaiFENesin (MUCINEX) 600 MG 12 hr tablet Take 1 tablet (600 mg total) by mouth 2 (two) times daily as needed.    . magic mouthwash SOLN Take 5 mLs by mouth 4 (four) times  daily as needed.    . magnesium 30 MG tablet Take 30 mg by mouth daily.    . Magnesium Gluconate 550 MG TABS Take by mouth.    . Multiple Vitamin (MULTI VITAMIN DAILY PO) Take 1 tablet by mouth daily.    . Omega-3 Fatty Acids (FISH OIL) 1000 MG CAPS Take 1,000 mg by mouth daily.    Marland Kitchen saccharomyces boulardii (FLORASTOR) 250 MG capsule Take 250 mg by mouth 2 (two) times daily.    . vitamin A 10000 UNIT capsule 1 capsule with food or milk    . Omeprazole (RA OMEPRAZOLE) 20 MG TBEC Take 20 mg by mouth.     No current facility-administered medications for this visit.      SURGICAL HISTORY:  Past Surgical History:  Procedure Laterality Date  . CHEST TUBE INSERTION Right 08/02/2015   Procedure: INSERTION OF SECOND RIGHT CHEST TUBE WITH FLUROSCOPY;  Surgeon: Grace Isaac, MD;  Location: [facility];  Service: Thoracic;  Laterality: Right;  . CHEST TUBE INSERTION  07/28/2015  . COLONOSCOPY W/ BIOPSIES AND POLYPECTOMY    . HERNIA REPAIR  2011  . INGUINAL HERNIA REPAIR Left 07/15/2015   Procedure: OPEN LEFT INGUINAL HERNIA REPAIR;  Surgeon: Johnathan Hausen, MD;  Location: [facility];  Service: General;  Laterality: Left;  With MESH  . TONSILLECTOMY    . VIDEO BRONCHOSCOPY WITH ENDOBRONCHIAL ULTRASOUND N/A 04/18/2015   Procedure: VIDEO BRONCHOSCOPY WITH ENDOBRONCHIAL ULTRASOUND;  Surgeon: Grace Isaac, MD;  Location: [facility];  Service: Thoracic;  Laterality: N/A;  . VIDEO BRONCHOSCOPY WITH INSERTION OF INTERBRONCHIAL VALVE (IBV) N/A 08/02/2015   Procedure: VIDEO BRONCHOSCOPY WITH ATTEMPTED INSERTION OF INTERBRONCHIAL VALVE (IBV) WITH FLUROSCOPY;  Surgeon: Grace Isaac, MD;  Location: [facility];  Service: Thoracic;  Laterality: N/A;  . WISDOM TOOTH EXTRACTION      REVIEW OF SYSTEMS:  Constitutional: positive for fatigue and weight loss Eyes: negative Ears, nose, mouth, throat, and face: negative Respiratory: positive for cough and dyspnea on exertion Cardiovascular: negative Gastrointestinal: negative Genitourinary:negative Integument/breast: negative Hematologic/lymphatic: negative Musculoskeletal:negative Neurological: negative Behavioral/Psych: negative Endocrine: negative Allergic/Immunologic: negative   PHYSICAL EXAMINATION: General appearance: alert, cooperative and no distress Head: Normocephalic, without obvious abnormality, atraumatic Neck: no adenopathy, no JVD, supple, symmetrical, trachea midline and thyroid not enlarged, symmetric, no tenderness/mass/nodules Lymph nodes: Cervical, supraclavicular, and axillary nodes normal. Resp: clear to  auscultation bilaterally Back: symmetric, no curvature. ROM normal. No CVA tenderness. Cardio: regular rate and rhythm, S1, S2 normal, no murmur, click, rub or gallop GI: soft, non-tender; bowel sounds normal; no masses,  no organomegaly Extremities: extremities normal, atraumatic, no cyanosis or edema Neurologic: Alert and oriented X 3, normal strength and tone. Normal symmetric reflexes. Normal coordination and gait  ECOG PERFORMANCE STATUS: 1 - Symptomatic but completely ambulatory  Blood pressure (!) 153/74, pulse (!) 102, temperature 97.5 F (36.4 C), temperature source Oral, resp. rate 18, height 5' 10"  (1.778 m), weight 121 lb 11.2 oz (55.2 kg), SpO2 98 %.  LABORATORY DATA: Lab Results  Component Value Date   WBC 10.7 (H) 09/17/2016   HGB 14.3 09/17/2016   HCT 43.5 09/17/2016   MCV 88.8 09/17/2016   PLT 283 09/17/2016      Chemistry      Component Value Date/Time   NA 139 09/03/2016 1320   K 3.9 09/03/2016 1320   CL 102 09/12/2015 0410   CO2 25 09/03/2016 1320   BUN 31.4 (H) 09/03/2016 1320   CREATININE 1.0 09/03/2016 1320  Component Value Date/Time   CALCIUM 10.1 09/03/2016 1320   ALKPHOS 121 09/03/2016 1320   AST 22 09/03/2016 1320   ALT 20 09/03/2016 1320   BILITOT 0.26 09/03/2016 1320       RADIOGRAPHIC STUDIES: Ct Chest W Contrast  Result Date: 09/01/2016 CLINICAL DATA:  Non-small cell (adenocarcinoma) carcinoma of the right lung, stage IIIA, diagnosed 04/21/2015 status post concurrent chemoradiation therapy and consolidation chemotherapy, with ongoing immunotherapy. Restaging. EXAM: CT CHEST, ABDOMEN, AND PELVIS WITH CONTRAST TECHNIQUE: Multidetector CT imaging of the chest, abdomen and pelvis was performed following the standard protocol during bolus administration of intravenous contrast. CONTRAST:  168m ISOVUE-300 IOPAMIDOL (ISOVUE-300) INJECTION 61% COMPARISON:  07/06/2016 chest CT. 03/12/2016 CT chest, abdomen and pelvis. FINDINGS: CT CHEST FINDINGS  Cardiovascular: Normal heart size. No significant pericardial fluid/thickening. Left anterior descending coronary atherosclerosis. Atherosclerotic nonaneurysmal thoracic aorta. Normal caliber pulmonary arteries. No central pulmonary emboli. Mediastinum/Nodes: No discrete thyroid nodules. Stable right deviation of the upper mediastinum. Stable mildly patulous upper thoracic esophagus. No axillary adenopathy. Mild right upper paratracheal adenopathy measuring up to 1.0 cm (series 2/ image 11), stable. No new pathologically enlarged mediastinal or hilar nodes. Lungs/Pleura: No pneumothorax. Stable pleural thickening throughout the right pleural space, most prominent in the upper right pleural space. No pleural effusions. Stable complete consolidation and severe volume loss in the right upper lobe with associated severe bronchiectasis. Moderate centrilobular and paraseptal emphysema with diffuse bronchial wall thickening. Central right lower lobe 2.1 x 1.5 cm nodule (series 2/ image 26), previously 2.0 x 1.5 cm, not appreciably changed. There is new patchy consolidation, ground-glass opacity and nodularity throughout the right lower lobe, with worsening right lower lobe volume loss, including a new 1.2 cm basilar right lower lobe nodule (series 6/ image 116). There are at least 6 scattered irregular pulmonary nodules throughout the left upper and left lower lobes, not appreciably changed. For example a subpleural anterior apical left upper lobe 1.4 x 1.1 cm nodule (series 6/image 11), previously 1.4 x 1.0 cm, stable. A solid 7 mm superior segment left lower lobe nodule (series 6/image 50), previously 7 mm, stable. The basilar left lower lobe subsolid 2.3 x 2.1 cm nodule (series 6/image 125), previously 2.3 x 2.1 cm using similar measurement technique, stable. Musculoskeletal: No aggressive appearing focal osseous lesions. Stable chronic mild T7 vertebral compression fracture. Mild-to-moderate thoracic spondylosis. CT  ABDOMEN PELVIS FINDINGS Hepatobiliary: Normal liver with no liver mass. Normal gallbladder with no radiopaque cholelithiasis. No biliary ductal dilatation. Pancreas: Normal, with no mass or duct dilation. Spleen: Normal size. No mass. 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 No left adrenal nodules. No hydronephrosis. No renal mass. Relatively collapsed and grossly normal bladder. Stomach/Bowel: Grossly normal stomach. A tiny portion of a right pelvic small bowel loop protrudes into a small right inguinal hernia. No small bowel dilatation, wall thickening, pneumatosis or focal caliber transition. Normal appendix. Mild distal colonic diverticulosis, with no large bowel wall thickening or pericolonic fat stranding. Vascular/Lymphatic: Atherosclerotic abdominal aorta. Stable ectasia of the infrarenal abdominal aorta, maximum diameter 2.5 cm. Patent portal, splenic, hepatic and renal veins. No pathologically enlarged lymph nodes in the abdomen or pelvis. Reproductive: Stable mildly enlarged prostate with nonspecific internal prostatic calcifications. Other: No pneumoperitoneum, ascites or focal fluid collection. Musculoskeletal: No aggressive appearing focal osseous lesions. Stable scattered small sclerotic lesions in the bilateral pelvic girdle, favor benign bone islands. Marked lumbar spondylosis. IMPRESSION: 1. Central right lower lobe neoplasm is stable. 2. New patchy and  nodular consolidation and worsening volume loss in the right lower lobe, favor infection or aspiration given the multifocality and patchy appearance, although new right lower lobe metastatic disease is not excluded. Consider a trial of antibiotic therapy. Short-term follow-up post treatment chest CT is recommended in 3 months. 3. Several irregular pulmonary nodules throughout the left lung are stable, suspicious for metastases and/or synchronous primary bronchogenic malignancies. 4. Stable severe volume loss,  consolidation and bronchiectasis in the right upper lobe, most consistent with post treatment change. 5. Stable mild right upper paratracheal lymphadenopathy. No new sites of adenopathy. 6. No metastatic disease in the abdomen or pelvis. 7. Small right inguinal hernia containing a tiny portion of a right pelvic small bowel loop. No evidence of bowel obstruction or ischemia. 8. Additional findings include aortic atherosclerosis, coronary atherosclerosis, moderate emphysema, right adrenal adenoma, mild distal colonic diverticulosis and mild prostatomegaly. Electronically Signed   By: Ilona Sorrel M.D.   On: 09/01/2016 12:33   Ct Abdomen Pelvis W Contrast  Result Date: 09/01/2016 CLINICAL DATA:  Non-small cell (adenocarcinoma) carcinoma of the right lung, stage IIIA, diagnosed 04/21/2015 status post concurrent chemoradiation therapy and consolidation chemotherapy, with ongoing immunotherapy. Restaging. EXAM: CT CHEST, ABDOMEN, AND PELVIS WITH CONTRAST TECHNIQUE: Multidetector CT imaging of the chest, abdomen and pelvis was performed following the standard protocol during bolus administration of intravenous contrast. CONTRAST:  115m ISOVUE-300 IOPAMIDOL (ISOVUE-300) INJECTION 61% COMPARISON:  07/06/2016 chest CT. 03/12/2016 CT chest, abdomen and pelvis. FINDINGS: CT CHEST FINDINGS Cardiovascular: Normal heart size. No significant pericardial fluid/thickening. Left anterior descending coronary atherosclerosis. Atherosclerotic nonaneurysmal thoracic aorta. Normal caliber pulmonary arteries. No central pulmonary emboli. Mediastinum/Nodes: No discrete thyroid nodules. Stable right deviation of the upper mediastinum. Stable mildly patulous upper thoracic esophagus. No axillary adenopathy. Mild right upper paratracheal adenopathy measuring up to 1.0 cm (series 2/ image 11), stable. No new pathologically enlarged mediastinal or hilar nodes. Lungs/Pleura: No pneumothorax. Stable pleural thickening throughout the right  pleural space, most prominent in the upper right pleural space. No pleural effusions. Stable complete consolidation and severe volume loss in the right upper lobe with associated severe bronchiectasis. Moderate centrilobular and paraseptal emphysema with diffuse bronchial wall thickening. Central right lower lobe 2.1 x 1.5 cm nodule (series 2/ image 26), previously 2.0 x 1.5 cm, not appreciably changed. There is new patchy consolidation, ground-glass opacity and nodularity throughout the right lower lobe, with worsening right lower lobe volume loss, including a new 1.2 cm basilar right lower lobe nodule (series 6/ image 116). There are at least 6 scattered irregular pulmonary nodules throughout the left upper and left lower lobes, not appreciably changed. For example a subpleural anterior apical left upper lobe 1.4 x 1.1 cm nodule (series 6/image 11), previously 1.4 x 1.0 cm, stable. A solid 7 mm superior segment left lower lobe nodule (series 6/image 50), previously 7 mm, stable. The basilar left lower lobe subsolid 2.3 x 2.1 cm nodule (series 6/image 125), previously 2.3 x 2.1 cm using similar measurement technique, stable. Musculoskeletal: No aggressive appearing focal osseous lesions. Stable chronic mild T7 vertebral compression fracture. Mild-to-moderate thoracic spondylosis. CT ABDOMEN PELVIS FINDINGS Hepatobiliary: Normal liver with no liver mass. Normal gallbladder with no radiopaque cholelithiasis. No biliary ductal dilatation. Pancreas: Normal, with no mass or duct dilation. Spleen: Normal size. No mass. 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 No left adrenal nodules. No hydronephrosis. No renal mass. Relatively collapsed and grossly normal bladder. Stomach/Bowel: Grossly normal stomach. A tiny portion of a  right pelvic small bowel loop protrudes into a small right inguinal hernia. No small bowel dilatation, wall thickening, pneumatosis or focal caliber transition.  Normal appendix. Mild distal colonic diverticulosis, with no large bowel wall thickening or pericolonic fat stranding. Vascular/Lymphatic: Atherosclerotic abdominal aorta. Stable ectasia of the infrarenal abdominal aorta, maximum diameter 2.5 cm. Patent portal, splenic, hepatic and renal veins. No pathologically enlarged lymph nodes in the abdomen or pelvis. Reproductive: Stable mildly enlarged prostate with nonspecific internal prostatic calcifications. Other: No pneumoperitoneum, ascites or focal fluid collection. Musculoskeletal: No aggressive appearing focal osseous lesions. Stable scattered small sclerotic lesions in the bilateral pelvic girdle, favor benign bone islands. Marked lumbar spondylosis. IMPRESSION: 1. Central right lower lobe neoplasm is stable. 2. New patchy and nodular consolidation and worsening volume loss in the right lower lobe, favor infection or aspiration given the multifocality and patchy appearance, although new right lower lobe metastatic disease is not excluded. Consider a trial of antibiotic therapy. Short-term follow-up post treatment chest CT is recommended in 3 months. 3. Several irregular pulmonary nodules throughout the left lung are stable, suspicious for metastases and/or synchronous primary bronchogenic malignancies. 4. Stable severe volume loss, consolidation and bronchiectasis in the right upper lobe, most consistent with post treatment change. 5. Stable mild right upper paratracheal lymphadenopathy. No new sites of adenopathy. 6. No metastatic disease in the abdomen or pelvis. 7. Small right inguinal hernia containing a tiny portion of a right pelvic small bowel loop. No evidence of bowel obstruction or ischemia. 8. Additional findings include aortic atherosclerosis, coronary atherosclerosis, moderate emphysema, right adrenal adenoma, mild distal colonic diverticulosis and mild prostatomegaly. Electronically Signed   By: Ilona Sorrel M.D.   On: 09/01/2016 12:33     ASSESSMENT AND PLAN: This is a very pleasant 75 years old white male with stage IIIA non-small cell lung cancer. He is status post a course of concurrent chemoradiation with weekly carboplatin and paclitaxel with partial response. This was followed by 1 cycle of consolidation chemotherapy with carboplatin and gemcitabine but this was discontinued secondary to right pneumothorax and frequent hospitalization. The patient has been on observation for the last few months. The recent CT scan of the chest showed improvement of the right hemithorax but there was enlarging subsided and new/enlarging soft tissue nodule in the left lung suspicious for metastatic disease. The patient is currently undergoing treatment with immunotherapy with Nivolumab status post 17 cycles. He tolerated his last treatment fairly well. I recommended for him to continue his treatment with immunotherapy and he will proceed with cycle #18 today. For pain management, he will continue on his current pain medication. For the mild anemia, he will continue taking over-the-counter iron tablets 1-2 tablets every day with vitamin C or orange juice. For hypertension, I advised the patient to check his blood pressure regularly at home and if continues to be elevated to consult with his primary care physician for evaluation and treatment. The patient would come back for follow-up visit in 2 weeks for reevaluation with repeat blood work before starting cycle #19. The patient will receive flu shot today. The lack of appetite, I discussed with the patient treatment with Megace but he would like to hold on this treatment for now and he will try to increase his oral intake. For smoke cessation, strongly encouraged the patient to quit smoking and offered him a smoke cessation program. The patient was advised to call immediately if he has any concerning symptoms in the interval. He was advised to call immediately if  he has any concerning symptoms  in the interval. The patient voices understanding of current disease status and treatment options and is in agreement with the current care plan.  All questions were answered. The patient knows to call the clinic with any problems, questions or concerns. We can certainly see the patient much sooner if necessary.  Disclaimer: This note was dictated with voice recognition software. Similar sounding words can inadvertently be transcribed and may not be corrected upon review.

## 2016-09-27 ENCOUNTER — Encounter: Payer: Self-pay | Admitting: Cardiothoracic Surgery

## 2016-09-27 ENCOUNTER — Ambulatory Visit (INDEPENDENT_AMBULATORY_CARE_PROVIDER_SITE_OTHER): Payer: 59 | Admitting: Cardiothoracic Surgery

## 2016-09-27 VITALS — BP 150/85 | HR 92 | Resp 20 | Ht 70.0 in | Wt 121.0 lb

## 2016-09-27 DIAGNOSIS — Z9221 Personal history of antineoplastic chemotherapy: Secondary | ICD-10-CM | POA: Diagnosis not present

## 2016-09-27 DIAGNOSIS — C3491 Malignant neoplasm of unspecified part of right bronchus or lung: Secondary | ICD-10-CM | POA: Diagnosis not present

## 2016-09-27 DIAGNOSIS — Z923 Personal history of irradiation: Secondary | ICD-10-CM

## 2016-09-27 NOTE — Progress Notes (Signed)
[address]       [place], [address]             [phone]      Attilio H Jamie Montezuma Medical Record #[id number] Date of Birth: 08-19-1941  Referring: Curt Bears, MD Primary Care: Cammy Copa, MD  Chief Complaint:   Chest tube placement Non-small cell carcinoma of right lung, stage 3 [facility])   Staging form: Lung, AJCC 7th Edition     Clinical stage from 04/21/2015: Stage IIIA (T2a, N2, M0) - Signed by Curt Bears, MD on 04/21/2015  Foundation One molecular studies reveal that he was positive for [place], [place], and STAG2 [id number]. He was negative for: EGFR, ALK, BRAF, MET, RET, ERBB2 and ROS1  PRIOR THERAPY:  1) Concurrent chemoradiation with chemotherapy in the form of weekly carboplatin for AUC 2 paclitaxel 45 mg/m given concurrent with radiation therapy. First cycle started 05/09/2015. Status post 7 cycles. 2) Consolidation chemotherapy with carboplatin for AUC of 5 on day 1 and gemcitabine 1000 MG/M2 on days 1 and 8 every 3 weeks. First dose 08/15/2015. Discontinued secondary to intolerance.  CURRENT THERAPY: Immunotherapy with Nivolumab 240 MG IV every 2 weeks. First dose 01/18/2016.  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 Several weeks ago he had some increased cough but this is now resolved.   Past Medical History:  Diagnosis Date  . [place]  . Constipation    AFTER ANESTHESIA  . Difficulty sleeping   . History of nonmelanoma skin cancer   . Inguinal hernia   . Lung mass    right / HAS HAD RADIATION AND CHEMO FOR LUNG CANCER  . Pneumothorax on right 08/24/2015  . Productive cough    "DUE TO  RADIATION"  . Radiation 05/11/15-06/21/15   NSCLCA/right lung  . Smoking 1/2 pack a day or less 09/17/2016  . Spontaneous pneumothorax MANY YRS AGO AND AGAIN 04/04/15   HISTORY OF (ONLY)     History  Smoking Status  . Current Every Day Smoker  . Packs/day: 0.50  . Years: 55.00  . Types: Cigarettes  Smokeless Tobacco  . Never Used    Comment: 1/2 ppd 05/15/16    History  Alcohol Use  . 0.0 oz/week    Comment: every other night -social     Allergies  Allergen Reactions  . Aleve [Naproxen] Rash    Current Outpatient Prescriptions  Medication Sig Dispense Refill  . acetaminophen (TYLENOL) 325 MG tablet 2 tablets    . albuterol (PROVENTIL HFA) 108 (90 Base) MCG/ACT inhaler 1-2 puffs as needed    . Arginine (RA L-ARGININE) 1000 MG TABS Take 500 mg by mouth daily.     . Ascorbic Acid (VITAMIN C) 1000 MG tablet Take 1,000 mg by mouth daily. Pt takes 2 5094mtabs    . B Complex Vitamins (B COMPLEX PO) Take 1 capsule by mouth daily.     . budesonide-formoterol (SYMBICORT) 160-4.5 MCG/ACT inhaler 2 puffs BID 1 Inhaler 5  . CALCIUM CITRATE PO Take 1 tablet by mouth daily.    . Cyanocobalamin (VITAMIN B 12 PO) Take 1 tablet by mouth daily.    . ferrous  sulfate 325 (65 FE) MG tablet Take 325 mg by mouth daily with breakfast.    . guaiFENesin (MUCINEX) 600 MG 12 hr tablet Take 1 tablet (600 mg total) by mouth 2 (two) times daily as needed.    . magic mouthwash SOLN Take 5 mLs by mouth 4 (four) times daily as needed.    . magnesium 30 MG tablet Take 30 mg by mouth daily.    . Magnesium Gluconate 550 MG TABS Take by mouth.    . Multiple Vitamin (MULTI VITAMIN DAILY PO) Take 1 tablet by mouth daily.    . Omega-3 Fatty Acids (FISH OIL) 1000 MG CAPS Take 1,000 mg by mouth daily.    Marland Kitchen omeprazole (PRILOSEC) 20 MG capsule     . saccharomyces boulardii (FLORASTOR) 250 MG capsule Take 250 mg by mouth 2 (two) times daily.    . vitamin A 10000 UNIT capsule 1 capsule with food or milk    .  Omeprazole (RA OMEPRAZOLE) 20 MG TBEC Take 20 mg by mouth.     No current facility-administered medications for this visit.      Wt Readings from Last 3 Encounters:  09/27/16 121 lb (54.9 kg)  09/17/16 121 lb 11.2 oz (55.2 kg)  09/03/16 123 lb 8 oz (56 kg)    Physical Exam: BP (!) 150/85   Pulse 92   Resp 20   Ht 5' 10"  (1.778 m)   Wt 121 lb (54.9 kg)   SpO2 98% Comment: RA  BMI 17.36 kg/m   General appearance: alert and cooperative Neurologic: intact Heart: regular rate and rhythm, S1, S2 normal, no murmur, click, rub or gallop Lungs: diminished breath sounds Right upper lobe Abdomen: soft, non-tender; bowel sounds normal; no masses,  no organomegaly Extremities: extremities normal, atraumatic, no cyanosis or edema and Homans sign is negative, no sign of DVT There is no cervical or supraclavicular adenopathy no rub Diagnostic Studies & Laboratory data:     Recent Radiology Findings: Ct Abdomen Pelvis  And chest W Contrast  Result Date: 09/01/2016 CLINICAL DATA:  Non-small cell (adenocarcinoma) carcinoma of the right lung, stage IIIA, diagnosed 04/21/2015 status post concurrent chemoradiation therapy and consolidation chemotherapy, with ongoing immunotherapy. Restaging. EXAM: CT CHEST, ABDOMEN, AND PELVIS WITH CONTRAST TECHNIQUE: Multidetector CT imaging of the chest, abdomen and pelvis was performed following the standard protocol during bolus administration of intravenous contrast. CONTRAST:  132m ISOVUE-300 IOPAMIDOL (ISOVUE-300) INJECTION 61% COMPARISON:  07/06/2016 chest CT. 03/12/2016 CT chest, abdomen and pelvis. FINDINGS: CT CHEST FINDINGS Cardiovascular: Normal heart size. No significant pericardial fluid/thickening. Left anterior descending coronary atherosclerosis. Atherosclerotic nonaneurysmal thoracic aorta. Normal caliber pulmonary arteries. No central pulmonary emboli. Mediastinum/Nodes: No discrete thyroid nodules. Stable right deviation of the upper mediastinum.  Stable mildly patulous upper thoracic esophagus. No axillary adenopathy. Mild right upper paratracheal adenopathy measuring up to 1.0 cm (series 2/ image 11), stable. No new pathologically enlarged mediastinal or hilar nodes. Lungs/Pleura: No pneumothorax. Stable pleural thickening throughout the right pleural space, most prominent in the upper right pleural space. No pleural effusions. Stable complete consolidation and severe volume loss in the right upper lobe with associated severe bronchiectasis. Moderate centrilobular and paraseptal emphysema with diffuse bronchial wall thickening. Central right lower lobe 2.1 x 1.5 cm nodule (series 2/ image 26), previously 2.0 x 1.5 cm, not appreciably changed. There is new patchy consolidation, ground-glass opacity and nodularity throughout the right lower lobe, with worsening right lower lobe volume loss, including a new 1.2 cm basilar right lower  lobe nodule (series 6/ image 116). There are at least 6 scattered irregular pulmonary nodules throughout the left upper and left lower lobes, not appreciably changed. For example a subpleural anterior apical left upper lobe 1.4 x 1.1 cm nodule (series 6/image 11), previously 1.4 x 1.0 cm, stable. A solid 7 mm superior segment left lower lobe nodule (series 6/image 50), previously 7 mm, stable. The basilar left lower lobe subsolid 2.3 x 2.1 cm nodule (series 6/image 125), previously 2.3 x 2.1 cm using similar measurement technique, stable. Musculoskeletal: No aggressive appearing focal osseous lesions. Stable chronic mild T7 vertebral compression fracture. Mild-to-moderate thoracic spondylosis. CT ABDOMEN PELVIS FINDINGS Hepatobiliary: Normal liver with no liver mass. Normal gallbladder with no radiopaque cholelithiasis. No biliary ductal dilatation. Pancreas: Normal, with no mass or duct dilation. Spleen: Normal size. No mass. 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 No left adrenal nodules.  No hydronephrosis. No renal mass. Relatively collapsed and grossly normal bladder. Stomach/Bowel: Grossly normal stomach. A tiny portion of a right pelvic small bowel loop protrudes into a small right inguinal hernia. No small bowel dilatation, wall thickening, pneumatosis or focal caliber transition. Normal appendix. Mild distal colonic diverticulosis, with no large bowel wall thickening or pericolonic fat stranding. Vascular/Lymphatic: Atherosclerotic abdominal aorta. Stable ectasia of the infrarenal abdominal aorta, maximum diameter 2.5 cm. Patent portal, splenic, hepatic and renal veins. No pathologically enlarged lymph nodes in the abdomen or pelvis. Reproductive: Stable mildly enlarged prostate with nonspecific internal prostatic calcifications. Other: No pneumoperitoneum, ascites or focal fluid collection. Musculoskeletal: No aggressive appearing focal osseous lesions. Stable scattered small sclerotic lesions in the bilateral pelvic girdle, favor benign bone islands. Marked lumbar spondylosis. IMPRESSION: 1. Central right lower lobe neoplasm is stable. 2. New patchy and nodular consolidation and worsening volume loss in the right lower lobe, favor infection or aspiration given the multifocality and patchy appearance, although new right lower lobe metastatic disease is not excluded. Consider a trial of antibiotic therapy. Short-term follow-up post treatment chest CT is recommended in 3 months. 3. Several irregular pulmonary nodules throughout the left lung are stable, suspicious for metastases and/or synchronous primary bronchogenic malignancies. 4. Stable severe volume loss, consolidation and bronchiectasis in the right upper lobe, most consistent with post treatment change. 5. Stable mild right upper paratracheal lymphadenopathy. No new sites of adenopathy. 6. No metastatic disease in the abdomen or pelvis. 7. Small right inguinal hernia containing a tiny portion of a right pelvic small bowel loop. No  evidence of bowel obstruction or ischemia. 8. Additional findings include aortic atherosclerosis, coronary atherosclerosis, moderate emphysema, right adrenal adenoma, mild distal colonic diverticulosis and mild prostatomegaly. Electronically Signed   By: Ilona Sorrel M.D.   On: 09/01/2016 12:33   Ct Chest W Contrast  Result Date: 07/07/2016 CLINICAL DATA:  Non-small-cell carcinoma of right lung, stage III. EXAM: CT CHEST WITH CONTRAST TECHNIQUE: Multidetector CT imaging of the chest was performed during intravenous contrast administration. CONTRAST:  86m ISOVUE-300 IOPAMIDOL (ISOVUE-300) INJECTION 61% COMPARISON:  CT scan of May 10, 2016. FINDINGS: Cardiovascular: Atherosclerosis of thoracic aorta is noted without aneurysm or dissection. Normal cardiac size. Mediastinum/Nodes: Stable right peritracheal adenopathy is noted with largest lymph node measuring 9 mm. Stable right axillary adenopathy is noted with largest lymph node measuring 6 mm. Lungs/Pleura: No pneumothorax is noted. Extensive postsurgical changes are noted in right upper lobe consistent with prior lobectomy. Mediastinal shift to the right is noted with hyperexpansion of the left lung. Air bronchograms are noted in  the right upper lobe which are stable. Pleural thickening is noted in the inferior portion of the right major fissure with probable scarring seen in the right lung base. This is not significantly changed. Stable pleural-based part solid density is seen anteriorly in left lung apex measuring 14 x 10 mm best seen on image number 16 of series 5. Stable sub solid nodular density is noted posteriorly in left upper lobe best seen on image number 39 of series 5. Stable bulla formation is noted in superior segment of left lower lobe. Stable sub solid density measuring 22 x 19 mm is noted in left lung base best seen on image number 126 of series 5. 6 mm solid nodule is noted in superior segment of left lower lobe best seen on image number 56 of  series 5 which is stable compared to prior exam. Upper Abdomen: No definite abnormality seen. 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 IMPRESSION: Stable postsurgical changes involving right upper lobe with mediastinal shift to the right. Stable consolidation is noted in the right upper lobe with air bronchograms. Stable right peritracheal and axillary adenopathy is noted. Stable pleural thickening and scarring is noted in the right lung. Stable 6 mm solid nodule is noted in superior segment of left lower lobe. Stable multiple part solid and sub solid nodules are noted in the left lung as described above. Electronically Signed   By: Marijo Conception, M.D.   On: 07/07/2016 07:56    Ct Chest W ContrastCt Abdomen Pelvis W Contrast 03/12/2016  CLINICAL DATA:  Stage III lung cancer. EXAM: CT CHEST, ABDOMEN, AND PELVIS WITH CONTRAST TECHNIQUE: Multidetector CT imaging of the chest, abdomen and pelvis was performed following the standard protocol during bolus administration of intravenous contrast. CONTRAST:  141m ISOVUE-300 IOPAMIDOL (ISOVUE-300) INJECTION 61% COMPARISON:  Chest CT from 01/03/2016. FINDINGS: CT CHEST FINDINGS Mediastinum/Lymph Nodes: There is no axillary lymphadenopathy. Small right paratracheal lymph nodes are stable as is the small subcarinal lymph node. Abnormal soft tissue attenuation in the right hilum and right infrahilar region, encasing the right lower lobe bronchus is unchanged. The heart size is normal. No pericardial effusion. Coronary artery calcification is noted. The esophagus has normal imaging features. Lungs/Pleura: Volume loss in the right hemi thorax is again noted. Air-fluid level seen in the right apex on the previous study has resolved in the interval. The right upper lobe collapse/consolidation with bronchiectasis is again noted, without substantial change. Continued further decrease in right pleural effusion evident.  Centrilobular emphysema noted left upper lobe. Sub solid left lower lobe pulmonary nodule measured previously 2.0 x 2.3 cm now measures 1.9 x 2.2 cm (image 125 series 4). 12 mm adjacent subpleural left lower lobe nodule seen on the previous study has decreased to 7 mm in the interval (image 128). Cavitary sub solid nodule in the superior segment left lower lobe measured previously 2.9 x 2.5 cm is stable at 2.8 x 2.5 cm (image 73). 7 mm nodule seen more cephalad in the left lower lobe on the prior study measures 6 mm today (image 57 ). Ill-defined sub solid nodule in the posterior left upper lobe measured previously at 12 x 25 mm now measures 16 x 23 mm. 15 mm area of anterior left apical scarring/nodularity on the prior study is now 16 mm (image 16). Musculoskeletal: Bone windows reveal no worrisome lytic or sclerotic osseous lesions. CT ABDOMEN PELVIS FINDINGS Hepatobiliary: No focal abnormality within the liver parenchyma. There is no  evidence for gallstones, gallbladder wall thickening, or pericholecystic fluid. 8 mm common bile duct diameter is upper normal for age. Pancreas: Slight prominence of the main pancreatic duct without pancreatic head lesion. Dystrophic calcification in the tail the pancreas may be from prior inflammation. Spleen: No splenomegaly. No focal mass lesion. Adrenals/Urinary Tract: 1.9 cm low-density lesion in the region the right adrenal gland is unchanged in the interval. Left adrenal gland is normal. No evidence for enhancing lesion in either kidney. There is no hydronephrosis. No evidence for hydroureter. Urinary bladder is normal in appearance. Stomach/Bowel: Stomach is nondistended. No gastric wall thickening. No evidence of outlet obstruction. Duodenum is normally positioned as is the ligament of Treitz. No small bowel wall thickening. No small bowel dilatation. The terminal ileum is normal. Diverticular changes are noted in the left colon without evidence of diverticulitis.  Vascular/Lymphatic: There is abdominal aortic atherosclerosis without aneurysm. There is no gastrohepatic or hepatoduodenal ligament lymphadenopathy. No intraperitoneal or retroperitoneal lymphadenopathy. No pelvic sidewall lymphadenopathy. Reproductive: Prostate gland is enlarged. Other: No intraperitoneal free fluid. Musculoskeletal: Bone windows reveal no worrisome lytic or sclerotic osseous lesions. IMPRESSION: 1. Interval resolution of tiny right apical gas collection with stable collapse/consolidation with bronchiectasis in the right upper lobe. 2. Stable appearance of residual soft tissue fullness around the right lower lobe bronchus. 3. Interval decrease in right pleural effusion. 4. No substantial change in the multiple wall solid and sub solid left lung nodules. 5. Emphysema. 6. Abdominal aortic atherosclerosis. 7. Stable appearance of low-density lesion adjacent to the upper pole the right kidney. Electronically Signed   By: Misty Stanley M.D.   On: 03/12/2016 17:28   Ct Chest W Contrast  01/03/2016  CLINICAL DATA:  Restaging non-small cell carcinoma of right lung. Diagnosed 6/16. Chemotherapy and radiation therapy 8/16. Cough. Shortness of breath on exertion. EXAM: CT CHEST WITH CONTRAST TECHNIQUE: Multidetector CT imaging of the chest was performed during intravenous contrast administration. CONTRAST:  79m OMNIPAQUE IOHEXOL 300 MG/ML  SOLN COMPARISON:  Plain film 11/03/2015.  Most recent CT of 09/30/2015. FINDINGS: Mediastinum/Nodes: No supraclavicular adenopathy. Mild left-sided gynecomastia. Normal heart size with lipomatous hypertrophy of the interatrial septum. Multivessel coronary artery atherosclerosis. No central pulmonary embolism, on this non-dedicated study. No well-defined mediastinal adenopathy. Lungs/Pleura: Right-sided pleural fluid is decreased, tiny. Residual right-sided pleural thickening. Decreased in right apical pneumothorax. Probable secretions in the right mainstem bronchus.  Moderate centrilobular emphysema. Persistent right apical and upper lobe dense consolidation with improvement in lateral areas of bronchiectasis. Improvement in right lung base aeration and septal thickening. Residual soft tissue thickening about the origin of the right upper lobe bronchus, which may represent residual neoplasia. Example image 32/ series 5. Sub solid left lower lobe pulmonary nodule measures 2.0 x 2.3 cm on image 53/series 5. Compare 1.6 x 1.5 cm on the prior exam. Development of a pleural-based adjacent left lower lobe pulmonary nodule which measures 12 mm on image 54/series 5. Is also present on coronal image 31. Increased definition of the superior segment left lower lobe partially cavitary sub solid pulmonary nodule. Example 2.9 x 2.5 cm on image 32/series 5. Compare 3.1 x 2.4 cm on the prior. Soft tissue nodule more cephalad in the left lower lobe measures 7 mm on image 25/ series 5 and is new. Posterior left upper lobe sub solid pulmonary nodule measures 2.5 x 1.2 cm on image 17/series 5 and is similar. Scarring versus sub solid pulmonary nodule on the anterior left apex at 1.5 cm on image 8/series  5. 1.3 cm on the prior. Upper abdomen: Old granulomatous disease in the liver. Normal imaged portions of the spleen, stomach, left adrenal gland, and left kidney. Upper pole right renal collecting system calculus. Right suprarenal nodule measures 1.9 cm on image 60/series 2 and is felt to be similar to on the prior exam. This is present back to 04/12/2015 PET, and not hypermetabolic on that study. Musculoskeletal: Healing fracture of the fourth antrolateral right rib on image 33/ series 2. New since the prior. No underlying osseous metastasis on the prior exam. IMPRESSION: 1. Overall improved appearance of the right hemi thorax. Although there is residual consolidation and bronchiectasis in the right apex and upper lobe, the right lower lobe demonstrates improved aeration with decreased adjacent  pleural fluid and thickening. 2. Residual soft tissue fullness about the origin of the right lower lobe bronchus, suspicious for residual neoplasia. 3. Enlarging sub solid and new/enlarging soft tissue nodules in the left lung, suspicious for metastatic disease. 4.  Atherosclerosis, including within the coronary arteries. 5. 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 6. 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 7. New right fourth rib healing fracture. Favored to be posttraumatic. Correlate with interval probable. Electronically Signed   By: Abigail Miyamoto M.D.   On: 01/03/2016 15:03    Ct Chest Wo Contrast  09/30/2015  CLINICAL DATA:  75 year old male with history of pneumothorax. Followup after chest tube removal. 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 EXAM: CT CHEST WITHOUT CONTRAST TECHNIQUE: Multidetector CT imaging of the chest was performed following the standard protocol without IV contrast. COMPARISON:  Chest CT 08/30/2015. FINDINGS: Mediastinum/Lymph Nodes: Heart size is normal. Small amount of pericardial fluid and/or thickening, unlikely to be of any hemodynamic significance at this time. No associated pericardial calcification. There is atherosclerosis of the thoracic aorta, the great vessels of the mediastinum and the coronary arteries, including calcified atherosclerotic plaque in the left main, left anterior descending, left circumflex and right coronary arteries. Calcifications of the aortic valve. Multiple borderline enlarged and mildly enlarged mediastinal lymph nodes are again noted, measuring up to 1.2 cm in short axis in the right paratracheal nodal station. There is also soft tissue fullness in the right hilar region, suggesting lymphadenopathy (poorly evaluated on today's noncontrast CT examination). Esophagus is unremarkable in appearance. No axillary  lymphadenopathy. Lungs/Pleura: When compared to remote prior examination 07/22/2015, there is evidence of severe chronic volume loss in the right hemithorax, which is notably smaller than the contralateral side. There is chronic collapse and consolidation of much of the right lung, most severe in the right upper lobe, where there also appears to be extensive varicose and cystic bronchiectasis. Cavity in the apex of the right lung appears to communicate with a thick walled cavity in the apex of the right pleural space, best appreciated on image 13 of series 3. This thick-walled cavity in the right pleural space appears contiguous with a tract along the anterior aspect of the right pleural space, extending into the lower right hemithorax, which likely represents the old chest tube tract. Other than this small amount of pleural gas, there is no significant residual pneumothorax. Small chronic thick-walled right pleural effusion posteriorly. Previously demonstrated right middle and lower lobe lesions are now obscured, but as mentioned above, there is extensive soft tissue fullness in the right hilar region, and in the infrahilar aspect of the right lower  lobe, which suggests residual neoplasm and/or adenopathy. Again noted is a large mass-like area of ground-glass attenuation, septal thickening and cystic change in the posterior aspect of the superior segment of the left lower lobe (image 30 of series 3), which appears larger than prior examinations, currently measuring 3.1 x 2.4 cm. There is also a irregular ground-glass attenuation lesion with septal thickening in the periphery of the left upper lobe (image 16 of series 3) which measures 2.4 x 1.2 cm, and an irregular sub solid lesion with spiculated margins in the inferior aspect of the left lower lobe (image 52 of series 3) which measures 15 x 16 mm. Upper Abdomen: 3 mm calcification in the interpolar region of the right kidney may be vascular, or may be a  nonobstructive calculus. Musculoskeletal/Soft Tissues: There are no aggressive appearing lytic or blastic lesions noted in the visualized portions of the skeleton. IMPRESSION: 1. Right-sided fibrothorax. 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 2. Worsening chronic consolidation and collapse in the right lung, with extensive nodular septal thickening, and persistent right infrahilar fullness in the central aspect of the right lower lobe, right hilar fullness and mediastinal adenopathy, as above, suggesting residual neoplasm with right hilar and mediastinal lymphadenopathy. 3. Enlarging sub solid lesions throughout the left lung, as above, concerning for multicentric adenocarcinoma. 4. Moderate centrilobular and paraseptal emphysema. 5. Possible 3 mm nonobstructive calculus in the interpolar collecting system of the right kidney. 6. Atherosclerosis, including left main and 3 vessel coronary artery disease. Electronically Signed   By: Vinnie Langton M.D.   On: 09/30/2015 11:16         Recent Lab Findings: Lab Results  Component Value Date   WBC 10.7 (H) 09/17/2016   HGB 14.3 09/17/2016   HCT 43.5 09/17/2016   PLT 283 09/17/2016   GLUCOSE 115 09/17/2016   ALT 19 09/17/2016   AST 18 09/17/2016   NA 140 09/17/2016   K 4.4 09/17/2016   CL 102 09/12/2015   CREATININE 1.0 09/17/2016   BUN 33.0 (H) 09/17/2016   CO2 26 09/17/2016   TSH 0.814 09/03/2016   INR 1.10 08/01/2015      Assessment / Plan:  Patient with extensive carcinoma the lung currently stable if not improving functionally, with stable findings on recent CT scan under current treatment regimen. Overall seen his functional status improved and knowing what his condition was 8-12 months ago he is much improved. He will return to  see me after his next CT scan in 4 months. Did spend time discussing with the patient balancing his activity level with the fatigue he asked experience especially when working 40 hours a week. [name]      [address] [place] [address] Office [phone]   Beeper [phone]  09/27/2016 1:20 PM

## 2016-10-01 ENCOUNTER — Other Ambulatory Visit (HOSPITAL_BASED_OUTPATIENT_CLINIC_OR_DEPARTMENT_OTHER): Payer: 59

## 2016-10-01 ENCOUNTER — Ambulatory Visit (HOSPITAL_BASED_OUTPATIENT_CLINIC_OR_DEPARTMENT_OTHER): Payer: 59

## 2016-10-01 ENCOUNTER — Ambulatory Visit (HOSPITAL_BASED_OUTPATIENT_CLINIC_OR_DEPARTMENT_OTHER): Payer: 59 | Admitting: Internal Medicine

## 2016-10-01 ENCOUNTER — Encounter: Payer: Self-pay | Admitting: Internal Medicine

## 2016-10-01 VITALS — HR 91 | Temp 97.8°F

## 2016-10-01 VITALS — BP 156/79 | Ht 70.0 in | Wt 122.8 lb

## 2016-10-01 DIAGNOSIS — R0602 Shortness of breath: Secondary | ICD-10-CM

## 2016-10-01 DIAGNOSIS — C3491 Malignant neoplasm of unspecified part of right bronchus or lung: Secondary | ICD-10-CM | POA: Diagnosis not present

## 2016-10-01 DIAGNOSIS — Z5112 Encounter for antineoplastic immunotherapy: Secondary | ICD-10-CM

## 2016-10-01 DIAGNOSIS — R05 Cough: Secondary | ICD-10-CM

## 2016-10-01 DIAGNOSIS — R062 Wheezing: Secondary | ICD-10-CM

## 2016-10-01 DIAGNOSIS — I10 Essential (primary) hypertension: Secondary | ICD-10-CM

## 2016-10-01 LAB — COMPREHENSIVE METABOLIC PANEL
ALBUMIN: 3.5 g/dL (ref 3.5–5.0)
ALK PHOS: 121 U/L (ref 40–150)
ALT: 22 U/L (ref 0–55)
ANION GAP: 10 meq/L (ref 3–11)
AST: 19 U/L (ref 5–34)
BILIRUBIN TOTAL: 0.29 mg/dL (ref 0.20–1.20)
BUN: 28.3 mg/dL — ABNORMAL HIGH (ref 7.0–26.0)
CO2: 25 mEq/L (ref 22–29)
Calcium: 9.7 mg/dL (ref 8.4–10.4)
Chloride: 107 mEq/L (ref 98–109)
Creatinine: 0.9 mg/dL (ref 0.7–1.3)
EGFR: 80 mL/min/{1.73_m2} — AB (ref >90)
GLUCOSE: 108 mg/dL (ref 70–140)
POTASSIUM: 3.9 meq/L (ref 3.5–5.1)
SODIUM: 142 meq/L (ref 136–145)
Total Protein: 7.7 g/dL (ref 6.4–8.3)

## 2016-10-01 LAB — CBC WITH DIFFERENTIAL/PLATELET
BASO%: 0.9 % (ref 0.0–2.0)
BASOS ABS: 0.1 10*3/uL (ref 0.0–0.1)
EOS ABS: 0.1 10*3/uL (ref 0.0–0.5)
EOS%: 1.4 % (ref 0.0–7.0)
HCT: 44.1 % (ref 38.4–49.9)
HEMOGLOBIN: 14.5 g/dL (ref 13.0–17.1)
LYMPH%: 8.6 % — AB (ref 14.0–49.0)
MCH: 29.1 pg (ref 27.2–33.4)
MCHC: 32.9 g/dL (ref 32.0–36.0)
MCV: 88.6 fL (ref 79.3–98.0)
MONO#: 0.6 10*3/uL (ref 0.1–0.9)
MONO%: 6.8 % (ref 0.0–14.0)
NEUT#: 7.8 10*3/uL — ABNORMAL HIGH (ref 1.5–6.5)
NEUT%: 82.3 % — ABNORMAL HIGH (ref 39.0–75.0)
PLATELETS: 222 10*3/uL (ref 140–400)
RBC: 4.98 10*6/uL (ref 4.20–5.82)
RDW: 14.4 % (ref 11.0–14.6)
WBC: 9.5 10*3/uL (ref 4.0–10.3)
lymph#: 0.8 10*3/uL — ABNORMAL LOW (ref 0.9–3.3)

## 2016-10-01 MED ORDER — SODIUM CHLORIDE 0.9 % IV SOLN
240.0000 mg | Freq: Once | INTRAVENOUS | Status: AC
  Administered 2016-10-01: 240 mg via INTRAVENOUS
  Filled 2016-10-01: qty 20

## 2016-10-01 MED ORDER — SODIUM CHLORIDE 0.9 % IV SOLN
Freq: Once | INTRAVENOUS | Status: AC
  Administered 2016-10-01: 15:00:00 via INTRAVENOUS

## 2016-10-01 NOTE — Patient Instructions (Signed)
[facility] Discharge Instructions for Patients Receiving Chemotherapy  Today you received the following chemotherapy agents:  Nivolumab.  To help prevent nausea and vomiting after your treatment, we encourage you to take your nausea medication as directed.   If you develop nausea and vomiting that is not controlled by your nausea medication, call the clinic.   BELOW ARE SYMPTOMS THAT SHOULD BE REPORTED IMMEDIATELY:  *FEVER GREATER THAN 100.5 F  *CHILLS WITH OR WITHOUT FEVER  NAUSEA AND VOMITING THAT IS NOT CONTROLLED WITH YOUR NAUSEA MEDICATION  *UNUSUAL SHORTNESS OF BREATH  *UNUSUAL BRUISING OR BLEEDING  TENDERNESS IN MOUTH AND THROAT WITH OR WITHOUT PRESENCE OF ULCERS  *URINARY PROBLEMS  *BOWEL PROBLEMS  UNUSUAL RASH Items with * indicate a potential emergency and should be followed up as soon as possible.  Feel free to call the clinic you have any questions or concerns. The clinic phone number is [phone].  Please show the CHEMO ALERT CARD at check-in to the Emergency Department and triage nurse.   

## 2016-10-01 NOTE — Progress Notes (Signed)
[facility] [phone]   Fax:([phone]  OFFICE PROGRESS NOTE  Cammy Copa, MD [phone] [address] 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  Foundation One molecular studies reveal that he was positive for [place], [place], and STAG2 S1064f*20. He was negative for: EGFR, ALK, BRAF, MET, RET, ERBB2 and ROS1  PRIOR THERAPY: 1) Concurrent chemoradiation with chemotherapy in the form of weekly carboplatin for AUC 2 paclitaxel 45 mg/m given concurrent with radiation therapy. First cycle started 05/09/2015. Status post 7 cycles. 2) Consolidation chemotherapy with carboplatin for AUC of 5 on day 1 and gemcitabine 1000 MG/M2 on days 1 and 8 every 3 weeks. First dose 08/15/2015. Discontinued secondary to intolerance.  CURRENT THERAPY: Immunotherapy with Nivolumab 240 MG IV every 2 weeks, status post 18 cycles. First dose was given 01/18/2016.  INTERVAL HISTORY: Gabriel BECKMANN75y.o. male returns to the clinic today for 2 weeks follow-up visit. He is currently on treatment with immunotherapy with Nivolumab status post 18 cycles. He tolerated the last cycle of his treatment well. He denied having any significant skin rash. He has no nausea, vomiting or diarrhea. He has no significant weight loss or night sweats. He enjoyed his Thanksgiving with his family. He continues to have chest wheezing. He denied having any significant chest pain but has shortness of breath with exertion with mild cough without hemoptysis. He is here today for evaluation before starting cycle #19.  MEDICAL HISTORY: Past Medical History:  Diagnosis Date  . [place]    [place] cancer  . Constipation    AFTER ANESTHESIA  . Difficulty sleeping   . History of nonmelanoma skin cancer   . Inguinal hernia   . Lung mass    right / HAS HAD RADIATION  AND CHEMO FOR LUNG CANCER  . Pneumothorax on right 08/24/2015  . Productive cough    "DUE TO RADIATION"  . Radiation 05/11/15-06/21/15   NSCLCA/right lung  . Smoking 1/2 pack a day or less 09/17/2016  . Spontaneous pneumothorax MANY YRS AGO AND AGAIN 04/04/15   HISTORY OF (ONLY)    ALLERGIES:  is allergic to aleve [naproxen].  MEDICATIONS:  Current Outpatient Prescriptions  Medication Sig Dispense Refill  . acetaminophen (TYLENOL) 325 MG tablet 2 tablets    . albuterol (PROVENTIL HFA) 108 (90 Base) MCG/ACT inhaler 1-2 puffs as needed    . Arginine (RA L-ARGININE) 1000 MG TABS Take 500 mg by mouth daily.     . Ascorbic Acid (VITAMIN C) 1000 MG tablet Take 1,000 mg by mouth daily. Pt takes 2 5057mtabs    . B Complex Vitamins (B COMPLEX PO) Take 1 capsule by mouth daily.     . budesonide-formoterol (SYMBICORT) 160-4.5 MCG/ACT inhaler 2 puffs BID 1 Inhaler 5  . CALCIUM CITRATE PO Take 1 tablet by mouth daily.    . Cyanocobalamin (VITAMIN B 12 PO) Take 1 tablet by mouth daily.    . ferrous sulfate 325 (65 FE) MG tablet Take 325 mg by mouth daily with breakfast.    . guaiFENesin (MUCINEX) 600 MG 12 hr tablet Take 1 tablet (600 mg total) by mouth 2 (two) times daily as needed.    . magic mouthwash SOLN Take 5 mLs by mouth 4 (four) times daily as needed.    . magnesium 30 MG tablet Take 30 mg by  mouth daily.    . Magnesium Gluconate 550 MG TABS Take by mouth.    . Multiple Vitamin (MULTI VITAMIN DAILY PO) Take 1 tablet by mouth daily.    . Omega-3 Fatty Acids (FISH OIL) 1000 MG CAPS Take 1,000 mg by mouth daily.    Marland Kitchen omeprazole (PRILOSEC) 20 MG capsule     . Omeprazole (RA OMEPRAZOLE) 20 MG TBEC Take 20 mg by mouth.    . saccharomyces boulardii (FLORASTOR) 250 MG capsule Take 250 mg by mouth 2 (two) times daily.    . vitamin A 10000 UNIT capsule 1 capsule with food or milk     No current facility-administered medications for this visit.     SURGICAL HISTORY:  Past Surgical History:    Procedure Laterality Date  . CHEST TUBE INSERTION Right 08/02/2015   Procedure: INSERTION OF SECOND RIGHT CHEST TUBE WITH FLUROSCOPY;  Surgeon: Grace Isaac, MD;  Location: [facility];  Service: Thoracic;  Laterality: Right;  . CHEST TUBE INSERTION  07/28/2015  . COLONOSCOPY W/ BIOPSIES AND POLYPECTOMY    . HERNIA REPAIR  2011  . INGUINAL HERNIA REPAIR Left 07/15/2015   Procedure: OPEN LEFT INGUINAL HERNIA REPAIR;  Surgeon: Johnathan Hausen, MD;  [facility];  Service: General;  Laterality: Left;  With MESH  . TONSILLECTOMY    . VIDEO BRONCHOSCOPY WITH ENDOBRONCHIAL ULTRASOUND N/A 04/18/2015   Procedure: VIDEO BRONCHOSCOPY WITH ENDOBRONCHIAL ULTRASOUND;  Surgeon: Grace Isaac, MD;  Location: [facility];  Service: Thoracic;  Laterality: N/A;  . VIDEO BRONCHOSCOPY WITH INSERTION OF INTERBRONCHIAL VALVE (IBV) N/A 08/02/2015   Procedure: VIDEO BRONCHOSCOPY WITH ATTEMPTED INSERTION OF INTERBRONCHIAL VALVE (IBV) WITH FLUROSCOPY;  Surgeon: Grace Isaac, MD;  [facility];  Service: Thoracic;  Laterality: N/A;  . WISDOM TOOTH EXTRACTION      REVIEW OF SYSTEMS:  A comprehensive review of systems was negative except for: Constitutional: positive for fatigue Respiratory: positive for cough, dyspnea on exertion and wheezing   PHYSICAL EXAMINATION: General appearance: alert, cooperative, fatigued and no distress Head: Normocephalic, without obvious abnormality, atraumatic Neck: no adenopathy, no JVD, supple, symmetrical, trachea midline and thyroid not enlarged, symmetric, no tenderness/mass/nodules Lymph nodes: Cervical, supraclavicular, and axillary nodes normal. Resp: wheezes bilaterally Back: symmetric, no curvature. ROM normal. No CVA tenderness. Cardio: regular rate and rhythm, S1, S2 normal, no murmur, click, rub or gallop GI: soft, non-tender; bowel sounds normal; no masses,  no organomegaly Extremities: extremities normal, atraumatic, no cyanosis or edema  ECOG PERFORMANCE STATUS: 1  - Symptomatic but completely ambulatory  There were no vitals taken for this visit.  LABORATORY DATA: Lab Results  Component Value Date   WBC 9.5 10/01/2016   HGB 14.5 10/01/2016   HCT 44.1 10/01/2016   MCV 88.6 10/01/2016   PLT 222 10/01/2016      Chemistry      Component Value Date/Time   NA 140 09/17/2016 1400   K 4.4 09/17/2016 1400   CL 102 09/12/2015 0410   CO2 26 09/17/2016 1400   BUN 33.0 (H) 09/17/2016 1400   CREATININE 1.0 09/17/2016 1400      Component Value Date/Time   CALCIUM 10.0 09/17/2016 1400   ALKPHOS 116 09/17/2016 1400   AST 18 09/17/2016 1400   ALT 19 09/17/2016 1400   BILITOT <0.22 09/17/2016 1400       RADIOGRAPHIC STUDIES: No results found.  ASSESSMENT AND PLAN: This is a very pleasant 75 years old white male with locally advanced non-small cell lung  cancer initially diagnosed as a stage IIIa status post concurrent chemoradiation. He is currently on second line treatment with immunotherapy with Nivolumab status post 18 cycles. He is tolerating his treatment well with no specific complaints except for the baseline shortness of breath and wheezing. I recommended for the patient to proceed with cycle #19 today as scheduled. For hypertension, the patient is not currently on any blood pressure medication. He mentioned that his blood pressure is usually normal at home and its usually the anxiety when he comes to the office. I recommended for him to check his blood pressure regularly at home and also at any drugstore and if persistent, to contact his primary care physician for evaluation. The patient voices understanding of current disease status and treatment options and is in agreement with the current care plan.  All questions were answered. The patient knows to call the clinic with any problems, questions or concerns. We can certainly see the patient much sooner if necessary.  Disclaimer: This note was dictated with voice recognition software. Similar  sounding words can inadvertently be transcribed and may not be corrected upon review.

## 2016-10-15 ENCOUNTER — Telehealth: Payer: Self-pay | Admitting: *Deleted

## 2016-10-15 ENCOUNTER — Telehealth: Payer: Self-pay | Admitting: Internal Medicine

## 2016-10-15 ENCOUNTER — Encounter: Payer: Self-pay | Admitting: Internal Medicine

## 2016-10-15 ENCOUNTER — Other Ambulatory Visit (HOSPITAL_BASED_OUTPATIENT_CLINIC_OR_DEPARTMENT_OTHER): Payer: 59

## 2016-10-15 ENCOUNTER — Ambulatory Visit (HOSPITAL_BASED_OUTPATIENT_CLINIC_OR_DEPARTMENT_OTHER): Payer: 59

## 2016-10-15 ENCOUNTER — Ambulatory Visit (HOSPITAL_BASED_OUTPATIENT_CLINIC_OR_DEPARTMENT_OTHER): Payer: 59 | Admitting: Internal Medicine

## 2016-10-15 VITALS — BP 149/79 | HR 93 | Temp 97.9°F | Resp 17 | Ht 70.0 in | Wt 123.0 lb

## 2016-10-15 DIAGNOSIS — R5383 Other fatigue: Secondary | ICD-10-CM

## 2016-10-15 DIAGNOSIS — R0609 Other forms of dyspnea: Secondary | ICD-10-CM

## 2016-10-15 DIAGNOSIS — C3491 Malignant neoplasm of unspecified part of right bronchus or lung: Secondary | ICD-10-CM

## 2016-10-15 DIAGNOSIS — R05 Cough: Secondary | ICD-10-CM | POA: Diagnosis not present

## 2016-10-15 DIAGNOSIS — Z5112 Encounter for antineoplastic immunotherapy: Secondary | ICD-10-CM

## 2016-10-15 LAB — COMPREHENSIVE METABOLIC PANEL
ALT: 23 U/L (ref 0–55)
ANION GAP: 11 meq/L (ref 3–11)
AST: 21 U/L (ref 5–34)
Albumin: 3.3 g/dL — ABNORMAL LOW (ref 3.5–5.0)
Alkaline Phosphatase: 129 U/L (ref 40–150)
BILIRUBIN TOTAL: 0.22 mg/dL (ref 0.20–1.20)
BUN: 27.1 mg/dL — ABNORMAL HIGH (ref 7.0–26.0)
CHLORIDE: 105 meq/L (ref 98–109)
CO2: 24 meq/L (ref 22–29)
CREATININE: 1 mg/dL (ref 0.7–1.3)
Calcium: 9.9 mg/dL (ref 8.4–10.4)
EGFR: 69 mL/min/{1.73_m2} — ABNORMAL LOW (ref >90)
GLUCOSE: 118 mg/dL (ref 70–140)
Potassium: 4.3 mEq/L (ref 3.5–5.1)
SODIUM: 140 meq/L (ref 136–145)
TOTAL PROTEIN: 8.1 g/dL (ref 6.4–8.3)

## 2016-10-15 LAB — CBC WITH DIFFERENTIAL/PLATELET
BASO%: 0.7 % (ref 0.0–2.0)
Basophils Absolute: 0.1 10*3/uL (ref 0.0–0.1)
EOS%: 1.3 % (ref 0.0–7.0)
Eosinophils Absolute: 0.1 10*3/uL (ref 0.0–0.5)
HCT: 42.9 % (ref 38.4–49.9)
HGB: 14 g/dL (ref 13.0–17.1)
LYMPH%: 7.6 % — AB (ref 14.0–49.0)
MCH: 28.7 pg (ref 27.2–33.4)
MCHC: 32.5 g/dL (ref 32.0–36.0)
MCV: 88.2 fL (ref 79.3–98.0)
MONO#: 0.6 10*3/uL (ref 0.1–0.9)
MONO%: 5.3 % (ref 0.0–14.0)
NEUT%: 85.1 % — AB (ref 39.0–75.0)
NEUTROS ABS: 9.4 10*3/uL — AB (ref 1.5–6.5)
PLATELETS: 296 10*3/uL (ref 140–400)
RBC: 4.87 10*6/uL (ref 4.20–5.82)
RDW: 14.3 % (ref 11.0–14.6)
WBC: 11.1 10*3/uL — AB (ref 4.0–10.3)
lymph#: 0.8 10*3/uL — ABNORMAL LOW (ref 0.9–3.3)

## 2016-10-15 MED ORDER — SODIUM CHLORIDE 0.9 % IV SOLN
240.0000 mg | Freq: Once | INTRAVENOUS | Status: AC
  Administered 2016-10-15: 240 mg via INTRAVENOUS
  Filled 2016-10-15: qty 20

## 2016-10-15 MED ORDER — SODIUM CHLORIDE 0.9 % IV SOLN
Freq: Once | INTRAVENOUS | Status: AC
  Administered 2016-10-15: 14:00:00 via INTRAVENOUS

## 2016-10-15 NOTE — Patient Instructions (Signed)
Steps to Quit Smoking Smoking tobacco can be bad for your health. It can also affect almost every organ in your body. Smoking puts you and people around you at risk for many serious long-lasting (chronic) diseases. Quitting smoking is hard, but it is one of the best things that you can do for your health. It is never too late to quit. What are the benefits of quitting smoking? When you quit smoking, you lower your risk for getting serious diseases and conditions. They can include:  Lung cancer or lung disease.  Heart disease.  Stroke.  Heart attack.  Not being able to have children (infertility).  Weak bones (osteoporosis) and broken bones (fractures). If you have coughing, wheezing, and shortness of breath, those symptoms may get better when you quit. You may also get sick less often. If you are pregnant, quitting smoking can help to lower your chances of having a baby of low birth weight. What can I do to help me quit smoking? Talk with your doctor about what can help you quit smoking. Some things you can do (strategies) include:  Quitting smoking totally, instead of slowly cutting back how much you smoke over a period of time.  Going to in-person counseling. You are more likely to quit if you go to many counseling sessions.  Using resources and support systems, such as:  Online chats with a counselor.  Phone quitlines.  Printed self-help materials.  Support groups or group counseling.  Text messaging programs.  Mobile phone apps or applications.  Taking medicines. Some of these medicines may have nicotine in them. If you are pregnant or breastfeeding, do not take any medicines to quit smoking unless your doctor says it is okay. Talk with your doctor about counseling or other things that can help you. Talk with your doctor about using more than one strategy at the same time, such as taking medicines while you are also going to in-person counseling. This can help make quitting  easier. What things can I do to make it easier to quit? Quitting smoking might feel very hard at first, but there is a lot that you can do to make it easier. Take these steps:  Talk to your family and friends. Ask them to support and encourage you.  Call phone quitlines, reach out to support groups, or work with a counselor.  Ask people who smoke to not smoke around you.  Avoid places that make you want (trigger) to smoke, such as:  Bars.  Parties.  Smoke-break areas at work.  Spend time with people who do not smoke.  Lower the stress in your life. Stress can make you want to smoke. Try these things to help your stress:  Getting regular exercise.  Deep-breathing exercises.  Yoga.  Meditating.  Doing a body scan. To do this, close your eyes, focus on one area of your body at a time from head to toe, and notice which parts of your body are tense. Try to relax the muscles in those areas.  Download or buy apps on your mobile phone or tablet that can help you stick to your quit plan. There are many free apps, such as QuitGuide from the CDC (Centers for Disease Control and Prevention). You can find more support from smokefree.gov and other websites. This information is not intended to replace advice given to you by your health care provider. Make sure you discuss any questions you have with your health care provider. Document Released: 08/11/2009 Document Revised: 06/12/2016 Document   Reviewed: 03/01/2015 Elsevier Interactive Patient Education  2017 Elsevier Inc.  

## 2016-10-15 NOTE — Telephone Encounter (Signed)
Appointments scheduled per 12/18 LOS. Patient given AVS report and calendars with future scheduled appointments.

## 2016-10-15 NOTE — Telephone Encounter (Signed)
Per LOS I have scheduled appts and gave patient calendar

## 2016-10-15 NOTE — Progress Notes (Signed)
[facility] [phone]   [phone]) [phone]  OFFICE PROGRESS NOTE  Cammy Copa, MD [phone] [address] [place] [address]  DIAGNOSIS: Progressive non-small cell lung cancer, adenocarcinoma initially diagnosed as a stage IIIA (T2a, N2, M0) in June 2016.  Foundation One molecular studies reveal that he was positive for [employer], [place], and STAG2 S1031f*20. He was negative for: EGFR, ALK, BRAF, MET, RET, ERBB2 and ROS1  PRIOR THERAPY: 1) Concurrent chemoradiation with chemotherapy in the form of weekly carboplatin for AUC 2 paclitaxel 45 mg/m given concurrent with radiation therapy. First cycle started 05/09/2015. Status post 7 cycles. 2) Consolidation chemotherapy with carboplatin for AUC of 5 on day 1 and gemcitabine 1000 MG/M2 on days 1 and 8 every 3 weeks. First dose 08/15/2015. Discontinued secondary to intolerance.  CURRENT THERAPY: Immunotherapy with Nivolumab 240 mg IV every 2 weeks status post 19 cycles.  INTERVAL HISTORY: Gabriel MINNEY75y.o. male came to the clinic today for follow-up visit. The patient continues to complain of persistent fatigue. He is tolerating his treatment well with no other significant side effects. He has shortness of breath with exertion. He continues to work around 40 hours weekly. He denied having any significant chest pain but has mild cough with no hemoptysis. He denied having any weight loss or night sweats. He has no nausea or vomiting. He denied having any skin rash or diarrhea. He is here today for evaluation before starting cycle #20.  MEDICAL HISTORY: Past Medical History:  Diagnosis Date  . [place]  . Constipation    AFTER ANESTHESIA  . Difficulty sleeping   . History of nonmelanoma skin cancer   . Inguinal hernia   . Lung mass    right / HAS HAD RADIATION AND CHEMO FOR LUNG CANCER  . Pneumothorax on right 08/24/2015  . Productive cough    "DUE  TO RADIATION"  . Radiation 05/11/15-06/21/15   NSCLCA/right lung  . Smoking 1/2 pack a day or less 09/17/2016  . Spontaneous pneumothorax MANY YRS AGO AND AGAIN 04/04/15   HISTORY OF (ONLY)    ALLERGIES:  is allergic to aleve [naproxen].  MEDICATIONS:  Current Outpatient Prescriptions  Medication Sig Dispense Refill  . acetaminophen (TYLENOL) 325 MG tablet 2 tablets    . albuterol (PROVENTIL HFA) 108 (90 Base) MCG/ACT inhaler 1-2 puffs as needed    . Arginine (RA L-ARGININE) 1000 MG TABS Take 500 mg by mouth daily.     . Ascorbic Acid (VITAMIN C) 1000 MG tablet Take 1,000 mg by mouth daily. Pt takes 2 5079mtabs    . B Complex Vitamins (B COMPLEX PO) Take 1 capsule by mouth daily.     . budesonide-formoterol (SYMBICORT) 160-4.5 MCG/ACT inhaler 2 puffs BID 1 Inhaler 5  . CALCIUM CITRATE PO Take 1 tablet by mouth daily.    . Cyanocobalamin (VITAMIN B 12 PO) Take 1 tablet by mouth daily.    . ferrous sulfate 325 (65 FE) MG tablet Take 325 mg by mouth daily with breakfast.    . guaiFENesin (MUCINEX) 600 MG 12 hr tablet Take 1 tablet (600 mg total) by mouth 2 (two) times daily as needed.    . magic mouthwash SOLN Take 5 mLs by mouth 4 (four) times daily as needed.    . magnesium 30 MG tablet Take 30 mg by mouth daily.    . Magnesium Gluconate 550 MG TABS Take  by mouth.    . Multiple Vitamin (MULTI VITAMIN DAILY PO) Take 1 tablet by mouth daily.    . Omega-3 Fatty Acids (FISH OIL) 1000 MG CAPS Take 1,000 mg by mouth daily.    Marland Kitchen omeprazole (PRILOSEC) 20 MG capsule     . Omeprazole (RA OMEPRAZOLE) 20 MG TBEC Take 20 mg by mouth.    . saccharomyces boulardii (FLORASTOR) 250 MG capsule Take 250 mg by mouth 2 (two) times daily.    . vitamin A 10000 UNIT capsule 1 capsule with food or milk     No current facility-administered medications for this visit.     SURGICAL HISTORY:  Past Surgical History:  Procedure Laterality Date  . CHEST TUBE INSERTION Right 08/02/2015   Procedure: INSERTION OF  SECOND RIGHT CHEST TUBE WITH FLUROSCOPY;  Surgeon: Grace Isaac, MD;  Location: [facility];  Service: Thoracic;  Laterality: Right;  . CHEST TUBE INSERTION  07/28/2015  . COLONOSCOPY W/ BIOPSIES AND POLYPECTOMY    . HERNIA REPAIR  2011  . INGUINAL HERNIA REPAIR Left 07/15/2015   Procedure: OPEN LEFT INGUINAL HERNIA REPAIR;  Surgeon: Johnathan Hausen, MD;  [facility];  Service: General;  Laterality: Left;  With MESH  . TONSILLECTOMY    . VIDEO BRONCHOSCOPY WITH ENDOBRONCHIAL ULTRASOUND N/A 04/18/2015   Procedure: VIDEO BRONCHOSCOPY WITH ENDOBRONCHIAL ULTRASOUND;  Surgeon: Grace Isaac, MD;  Location: [facility];  Service: Thoracic;  Laterality: N/A;  . VIDEO BRONCHOSCOPY WITH INSERTION OF INTERBRONCHIAL VALVE (IBV) N/A 08/02/2015   Procedure: VIDEO BRONCHOSCOPY WITH ATTEMPTED INSERTION OF INTERBRONCHIAL VALVE (IBV) WITH FLUROSCOPY;  Surgeon: Grace Isaac, MD;  [facility];  Service: Thoracic;  Laterality: N/A;  . WISDOM TOOTH EXTRACTION      REVIEW OF SYSTEMS:  A comprehensive review of systems was negative except for: Constitutional: positive for fatigue Respiratory: positive for cough, dyspnea on exertion and wheezing   PHYSICAL EXAMINATION: General appearance: alert, cooperative, fatigued and no distress Head: Normocephalic, without obvious abnormality, atraumatic Neck: no adenopathy, no JVD, supple, symmetrical, trachea midline and thyroid not enlarged, symmetric, no tenderness/mass/nodules Lymph nodes: Cervical, supraclavicular, and axillary nodes normal. Resp: wheezes bilaterally Back: symmetric, no curvature. ROM normal. No CVA tenderness. Cardio: regular rate and rhythm, S1, S2 normal, no murmur, click, rub or gallop GI: soft, non-tender; bowel sounds normal; no masses,  no organomegaly Extremities: extremities normal, atraumatic, no cyanosis or edema  ECOG PERFORMANCE STATUS: 1 - Symptomatic but completely ambulatory  Blood pressure (!) 149/79, pulse 93, temperature  97.9 F (36.6 C), temperature source Oral, resp. rate 17, height 5' 10"  (1.778 m), weight 123 lb (55.8 kg), SpO2 93 %.  LABORATORY DATA: Lab Results  Component Value Date   WBC 11.1 (H) 10/15/2016   HGB 14.0 10/15/2016   HCT 42.9 10/15/2016   MCV 88.2 10/15/2016   PLT 296 10/15/2016      Chemistry      Component Value Date/Time   NA 140 10/15/2016 1147   K 4.3 10/15/2016 1147   CL 102 09/12/2015 0410   CO2 24 10/15/2016 1147   BUN 27.1 (H) 10/15/2016 1147   CREATININE 1.0 10/15/2016 1147      Component Value Date/Time   CALCIUM 9.9 10/15/2016 1147   ALKPHOS 129 10/15/2016 1147   AST 21 10/15/2016 1147   ALT 23 10/15/2016 1147   BILITOT 0.22 10/15/2016 1147       RADIOGRAPHIC STUDIES: No results found.  ASSESSMENT AND PLAN: This is a very pleasant 75  years old white male with progressive non-small cell lung cancer initially diagnosed as stage IIIa status post concurrent chemoradiation with disease progression after his treatment.  He was started on treatment with immunotherapy with Nivolumab status post 19 cycles. He is tolerating this treatment well with no significant adverse effects except for persistent fatigue. Fatigue is of unclear etiology but likely secondary to multiple factors including his history of lung cancer, COPD as well as the immunotherapy treatment. I recommended for the patient to proceed with cycle #20 today as a scheduled. I will see him back for follow-up visit in 2 weeks for reevaluation with repeat CT scan of the chest for restaging of his disease. He was advised to call immediately if he has any concerning symptoms in the interval. The patient voices understanding of current disease status and treatment options and is in agreement with the current care plan.  All questions were answered. The patient knows to call the clinic with any problems, questions or concerns. We can certainly see the patient much sooner if necessary.  Disclaimer: This note  was dictated with voice recognition software. Similar sounding words can inadvertently be transcribed and may not be corrected upon review.

## 2016-10-15 NOTE — Telephone Encounter (Signed)
Per LOS I have scheduled appts and gave the patient a calendar

## 2016-10-15 NOTE — Patient Instructions (Signed)
[facility] Discharge Instructions for Patients Receiving Chemotherapy  Today you received the following chemotherapy agents: Nivolumab Take Imodium (over the counter)  for diarrhea if needed. Call office for diarrhea that does not respond to Imodium.   If you develop nausea and vomiting that is not controlled by your nausea medication, call the clinic.   BELOW ARE SYMPTOMS THAT SHOULD BE REPORTED IMMEDIATELY:  *FEVER GREATER THAN 100.5 F  *CHILLS WITH OR WITHOUT FEVER  NAUSEA AND VOMITING THAT IS NOT CONTROLLED WITH YOUR NAUSEA MEDICATION  *UNUSUAL SHORTNESS OF BREATH  *UNUSUAL BRUISING OR BLEEDING  TENDERNESS IN MOUTH AND THROAT WITH OR WITHOUT PRESENCE OF ULCERS  *URINARY PROBLEMS  *BOWEL PROBLEMS  UNUSUAL RASH Items with * indicate a potential emergency and should be followed up as soon as possible.  Feel free to call the clinic should you have any questions or concerns. The clinic phone number is [phone].  Please show the [facility] at check-in to the Emergency Department and triage nurse.

## 2016-10-25 NOTE — Progress Notes (Signed)
pft  

## 2016-10-26 ENCOUNTER — Ambulatory Visit (HOSPITAL_COMMUNITY)
Admission: RE | Admit: 2016-10-26 | Discharge: 2016-10-26 | Disposition: A | Discharge status: Home / Self Care | Payer: 59 | Source: Ambulatory Visit | Attending: Internal Medicine | Admitting: Internal Medicine

## 2016-10-26 DIAGNOSIS — R590 Localized enlarged lymph nodes: Secondary | ICD-10-CM | POA: Diagnosis not present

## 2016-10-26 DIAGNOSIS — R918 Other nonspecific abnormal finding of lung field: Secondary | ICD-10-CM | POA: Insufficient documentation

## 2016-10-26 DIAGNOSIS — C3491 Malignant neoplasm of unspecified part of right bronchus or lung: Secondary | ICD-10-CM | POA: Diagnosis present

## 2016-10-26 DIAGNOSIS — Z5112 Encounter for antineoplastic immunotherapy: Secondary | ICD-10-CM | POA: Diagnosis not present

## 2016-10-26 MED ORDER — IOPAMIDOL (ISOVUE-300) INJECTION 61%
INTRAVENOUS | Status: AC
  Filled 2016-10-26: qty 75

## 2016-10-26 MED ORDER — IOPAMIDOL (ISOVUE-300) INJECTION 61%
75.0000 mL | Freq: Once | INTRAVENOUS | Status: AC | PRN
  Administered 2016-10-26: 75 mL via INTRAVENOUS

## 2016-10-30 ENCOUNTER — Encounter: Payer: Self-pay | Admitting: Internal Medicine

## 2016-10-30 ENCOUNTER — Ambulatory Visit (HOSPITAL_BASED_OUTPATIENT_CLINIC_OR_DEPARTMENT_OTHER): Payer: 59

## 2016-10-30 ENCOUNTER — Other Ambulatory Visit (HOSPITAL_BASED_OUTPATIENT_CLINIC_OR_DEPARTMENT_OTHER): Payer: 59

## 2016-10-30 ENCOUNTER — Ambulatory Visit (HOSPITAL_BASED_OUTPATIENT_CLINIC_OR_DEPARTMENT_OTHER): Payer: 59 | Admitting: Internal Medicine

## 2016-10-30 VITALS — BP 172/79 | HR 91 | Temp 97.8°F | Resp 16 | Wt 120.5 lb

## 2016-10-30 DIAGNOSIS — Z5112 Encounter for antineoplastic immunotherapy: Secondary | ICD-10-CM

## 2016-10-30 DIAGNOSIS — C3491 Malignant neoplasm of unspecified part of right bronchus or lung: Secondary | ICD-10-CM

## 2016-10-30 DIAGNOSIS — I10 Essential (primary) hypertension: Secondary | ICD-10-CM | POA: Diagnosis not present

## 2016-10-30 DIAGNOSIS — F17210 Nicotine dependence, cigarettes, uncomplicated: Secondary | ICD-10-CM

## 2016-10-30 DIAGNOSIS — R0602 Shortness of breath: Secondary | ICD-10-CM | POA: Diagnosis not present

## 2016-10-30 DIAGNOSIS — D6481 Anemia due to antineoplastic chemotherapy: Secondary | ICD-10-CM | POA: Diagnosis not present

## 2016-10-30 DIAGNOSIS — J449 Chronic obstructive pulmonary disease, unspecified: Secondary | ICD-10-CM

## 2016-10-30 DIAGNOSIS — Z720 Tobacco use: Secondary | ICD-10-CM

## 2016-10-30 HISTORY — DX: Essential (primary) hypertension: I10

## 2016-10-30 LAB — COMPREHENSIVE METABOLIC PANEL
ALT: 21 U/L (ref 0–55)
ANION GAP: 11 meq/L (ref 3–11)
AST: 21 U/L (ref 5–34)
Albumin: 3.8 g/dL (ref 3.5–5.0)
Alkaline Phosphatase: 133 U/L (ref 40–150)
BILIRUBIN TOTAL: 0.27 mg/dL (ref 0.20–1.20)
BUN: 25.4 mg/dL (ref 7.0–26.0)
CHLORIDE: 104 meq/L (ref 98–109)
CO2: 25 mEq/L (ref 22–29)
Calcium: 10 mg/dL (ref 8.4–10.4)
Creatinine: 0.9 mg/dL (ref 0.7–1.3)
EGFR: 79 mL/min/{1.73_m2} — AB (ref >90)
Glucose: 110 mg/dl (ref 70–140)
Potassium: 4 mEq/L (ref 3.5–5.1)
Sodium: 140 mEq/L (ref 136–145)
TOTAL PROTEIN: 8.1 g/dL (ref 6.4–8.3)

## 2016-10-30 LAB — CBC WITH DIFFERENTIAL/PLATELET
BASO%: 0.8 % (ref 0.0–2.0)
Basophils Absolute: 0.1 10*3/uL (ref 0.0–0.1)
EOS%: 1.1 % (ref 0.0–7.0)
Eosinophils Absolute: 0.1 10*3/uL (ref 0.0–0.5)
HCT: 44 % (ref 38.4–49.9)
HGB: 14.7 g/dL (ref 13.0–17.1)
LYMPH#: 0.9 10*3/uL (ref 0.9–3.3)
LYMPH%: 8.5 % — ABNORMAL LOW (ref 14.0–49.0)
MCH: 29.5 pg (ref 27.2–33.4)
MCHC: 33.5 g/dL (ref 32.0–36.0)
MCV: 88.1 fL (ref 79.3–98.0)
MONO#: 0.5 10*3/uL (ref 0.1–0.9)
MONO%: 5 % (ref 0.0–14.0)
NEUT#: 9 10*3/uL — ABNORMAL HIGH (ref 1.5–6.5)
NEUT%: 84.6 % — ABNORMAL HIGH (ref 39.0–75.0)
PLATELETS: 288 10*3/uL (ref 140–400)
RBC: 4.99 10*6/uL (ref 4.20–5.82)
RDW: 15 % — AB (ref 11.0–14.6)
WBC: 10.6 10*3/uL — AB (ref 4.0–10.3)

## 2016-10-30 MED ORDER — SODIUM CHLORIDE 0.9 % IV SOLN
240.0000 mg | Freq: Once | INTRAVENOUS | Status: AC
  Administered 2016-10-30: 240 mg via INTRAVENOUS
  Filled 2016-10-30: qty 20

## 2016-10-30 MED ORDER — SODIUM CHLORIDE 0.9 % IV SOLN
Freq: Once | INTRAVENOUS | Status: AC
Start: 2016-10-30 — End: 2016-10-30
  Administered 2016-10-30: 14:00:00 via INTRAVENOUS

## 2016-10-30 MED ORDER — DOXYCYCLINE HYCLATE 100 MG PO TABS: 100.0000 mg | ORAL_TABLET | Freq: Two times a day (BID) | ORAL | 0 refills | Status: DC

## 2016-10-30 NOTE — Patient Instructions (Signed)
[facility] Discharge Instructions for Patients Receiving Chemotherapy  Today you received the following chemotherapy agents: Nivolumab.  Take Imodium (over the counter)  for diarrhea if needed. Call office for diarrhea that does not respond to Imodium.   If you develop nausea and vomiting that is not controlled by your nausea medication, call the clinic.   BELOW ARE SYMPTOMS THAT SHOULD BE REPORTED IMMEDIATELY:  *FEVER GREATER THAN 100.5 F  *CHILLS WITH OR WITHOUT FEVER  NAUSEA AND VOMITING THAT IS NOT CONTROLLED WITH YOUR NAUSEA MEDICATION  *UNUSUAL SHORTNESS OF BREATH  *UNUSUAL BRUISING OR BLEEDING  TENDERNESS IN MOUTH AND THROAT WITH OR WITHOUT PRESENCE OF ULCERS  *URINARY PROBLEMS  *BOWEL PROBLEMS  UNUSUAL RASH Items with * indicate a potential emergency and should be followed up as soon as possible.  Feel free to call the clinic should you have any questions or concerns. The clinic phone number is [phone].  Please show the [place] at check-in to the Emergency Department and triage nurse.

## 2016-10-30 NOTE — Progress Notes (Signed)
Gabriel [name]   [phone]  OFFICE PROGRESS NOTE  Gabriel Copa, MD [phone] [address] [place] [address]  DIAGNOSIS: Progressive non-small cell lung cancer, adenocarcinoma initially diagnosed as a stage IIIa (T2a, N2, M0) in July 2016.  Foundation One molecular studies reveal that he was positive for [place], [place], and STAG2 [id number]. He was negative for: EGFR, ALK, BRAF, MET, RET, ERBB2 and ROS1  PRIOR THERAPY: 1) Concurrent chemoradiation with chemotherapy in the form of weekly carboplatin for AUC 2 paclitaxel 45 mg/m given concurrent with radiation therapy. First cycle started 05/09/2015. Status post 7 cycles. 2) Consolidation chemotherapy with carboplatin for AUC of 5 on day 1 and gemcitabine 1000 MG/M2 on days 1 and 8 every 3 weeks. First dose 08/15/2015. Discontinued secondary to intolerance.  CURRENT THERAPY: Immunotherapy with Nivolumab 240 mg IV every 2 weeks, status post 20 cycles.  INTERVAL HISTORY: Gabriel [id number].[id number]. male returns to the clinic today for follow-up visit accompanied by [name] The patient is feeling fine today except for the baseline shortness of breath and dry cough. He lost few pounds recently. Unfortunately he continues to smoke. He is tolerating his treatment with Nivolumab fairly well with no significant adverse effects. He denied having any skin rash. He denied having any nausea, vomiting, diarrhea or constipation. He has no chest pain or hemoptysis. He had repeat CT scan of the chest performed recently and he is here for evaluation and discussion of the scan results.  MEDICAL HISTORY: Past Medical History:  Diagnosis Date  . Cancer ([facility]    skin cancer  . Constipation    AFTER ANESTHESIA  . Difficulty sleeping   . History of nonmelanoma skin cancer   . Inguinal hernia   . Lung mass    right / HAS HAD RADIATION AND CHEMO FOR LUNG CANCER  .  Pneumothorax on right 08/24/2015  . Productive cough    "DUE TO RADIATION"  . Radiation 05/11/15-06/21/15   NSCLCA/right lung  . Smoking 1/2 pack a day or less 09/17/2016  . Spontaneous pneumothorax MANY YRS AGO AND AGAIN 04/04/15   HISTORY OF (ONLY)    ALLERGIES:  is allergic to aleve [naproxen].  MEDICATIONS:  Current Outpatient Prescriptions  Medication Sig Dispense Refill  . acetaminophen (TYLENOL) 325 MG tablet 2 tablets    . albuterol (PROVENTIL HFA) 108 (90 Base) MCG/ACT inhaler 1-2 puffs as needed    . Arginine (RA L-ARGININE) 1000 MG TABS Take 500 mg by mouth daily.     . Ascorbic Acid (VITAMIN C) 1000 MG tablet Take 1,000 mg by mouth daily. Pt takes 2 5055mtabs    . B Complex Vitamins (B COMPLEX PO) Take 1 capsule by mouth daily.     . budesonide-formoterol (SYMBICORT) 160-4.5 MCG/ACT inhaler 2 puffs BID 1 Inhaler 5  . CALCIUM CITRATE PO Take 1 tablet by mouth daily.    . Cyanocobalamin (VITAMIN B 12 PO) Take 1 tablet by mouth daily.    . ferrous sulfate 325 (65 FE) MG tablet Take 325 mg by mouth daily with breakfast.    . guaiFENesin (MUCINEX) 600 MG 12 hr tablet Take 1 tablet (600 mg total) by mouth 2 (two) times daily as needed.    . magic mouthwash SOLN Take 5 mLs by mouth 4 (four) times daily as needed.    . magnesium 30 MG tablet Take 30 mg by mouth daily.    .Marland Kitchen  Magnesium Gluconate 550 MG TABS Take by mouth.    . Multiple Vitamin (MULTI VITAMIN DAILY PO) Take 1 tablet by mouth daily.    . Omega-3 Fatty Acids (FISH OIL) 1000 MG CAPS Take 1,000 mg by mouth daily.    Marland Kitchen omeprazole (PRILOSEC) 20 MG capsule     . Omeprazole (RA OMEPRAZOLE) 20 MG TBEC Take 20 mg by mouth.    . saccharomyces boulardii (FLORASTOR) 250 MG capsule Take 250 mg by mouth 2 (two) times daily.    . vitamin A 10000 UNIT capsule 1 capsule with food or milk     No current facility-administered medications for this visit.     SURGICAL HISTORY:  Past Surgical History:  Procedure Laterality Date  .  CHEST TUBE INSERTION Right 08/02/2015   Procedure: INSERTION OF SECOND RIGHT CHEST TUBE WITH FLUROSCOPY;  Surgeon: Grace Isaac, MD;  Location: [facility];  Service: Thoracic;  Laterality: Right;  . CHEST TUBE INSERTION  07/28/2015  . COLONOSCOPY W/ BIOPSIES AND POLYPECTOMY    . HERNIA REPAIR  2011  . INGUINAL HERNIA REPAIR Left 07/15/2015   Procedure: OPEN LEFT INGUINAL HERNIA REPAIR;  Surgeon: Johnathan Hausen, MD;  Location: [facility];  Service: General;  Laterality: Left;  With MESH  . TONSILLECTOMY    . VIDEO BRONCHOSCOPY WITH ENDOBRONCHIAL ULTRASOUND N/A 04/18/2015   Procedure: VIDEO BRONCHOSCOPY WITH ENDOBRONCHIAL ULTRASOUND;  Surgeon: Grace Isaac, MD;  Location: [facility];  Service: Thoracic;  Laterality: N/A;  . VIDEO BRONCHOSCOPY WITH INSERTION OF INTERBRONCHIAL VALVE (IBV) N/A 08/02/2015   Procedure: VIDEO BRONCHOSCOPY WITH ATTEMPTED INSERTION OF INTERBRONCHIAL VALVE (IBV) WITH FLUROSCOPY;  Surgeon: Grace Isaac, MD;  Location: [facility];  Service: Thoracic;  Laterality: N/A;  . WISDOM TOOTH EXTRACTION      REVIEW OF SYSTEMS:  Constitutional: positive for fatigue Eyes: negative Ears, nose, mouth, throat, and face: negative Respiratory: positive for cough and dyspnea on exertion Cardiovascular: negative Gastrointestinal: negative Genitourinary:negative Integument/breast: negative Hematologic/lymphatic: negative Musculoskeletal:negative Neurological: negative Behavioral/Psych: negative Endocrine: negative Allergic/Immunologic: negative   PHYSICAL EXAMINATION: General appearance: alert, cooperative, fatigued and no distress Head: Normocephalic, without obvious abnormality, atraumatic Neck: no adenopathy, no JVD, supple, symmetrical, trachea midline and thyroid not enlarged, symmetric, no tenderness/mass/nodules Lymph nodes: Cervical, supraclavicular, and axillary nodes normal. Resp: rhonchi bilaterally and wheezes bilaterally Back: symmetric, no curvature. ROM normal. No CVA  tenderness. Cardio: regular rate and rhythm, S1, S2 normal, no murmur, click, rub or gallop GI: soft, non-tender; bowel sounds normal; no masses,  no organomegaly Extremities: extremities normal, atraumatic, no cyanosis or edema Neurologic: Alert and oriented X 3, normal strength and tone. Normal symmetric reflexes. Normal coordination and gait  ECOG PERFORMANCE STATUS: 1 - Symptomatic but completely ambulatory  There were no vitals taken for this visit.  LABORATORY DATA: Lab Results  Component Value Date   WBC 11.1 (H) 10/15/2016   HGB 14.0 10/15/2016   HCT 42.9 10/15/2016   MCV 88.2 10/15/2016   PLT 296 10/15/2016      Chemistry      Component Value Date/Time   NA 140 10/15/2016 1147   K 4.3 10/15/2016 1147   CL 102 09/12/2015 0410   CO2 24 10/15/2016 1147   BUN 27.1 (H) 10/15/2016 1147   CREATININE 1.0 10/15/2016 1147      Component Value Date/Time   CALCIUM 9.9 10/15/2016 1147   ALKPHOS 129 10/15/2016 1147   AST 21 10/15/2016 1147   ALT 23 10/15/2016 1147   BILITOT 0.22 10/15/2016 1147  RADIOGRAPHIC STUDIES: Ct Chest W Contrast  Result Date: 10/27/2016 CLINICAL DATA:  Non-small cell lung cancer. Restaging exam. Immunotherapy ongoing. EXAM: CT CHEST WITH CONTRAST TECHNIQUE: Multidetector CT imaging of the chest was performed during intravenous contrast administration. CONTRAST:  54m ISOVUE-300 IOPAMIDOL (ISOVUE-300) INJECTION 61% COMPARISON:  CT 08/31/2016 FINDINGS: Cardiovascular: Coronary artery calcification and aortic atherosclerotic calcification. No pericardial fluid. Mediastinum/Nodes: No axillary or supraclavicular lymphadenopathy. RIGHT lower paratracheal lymph nodes again noted. For example 10 mm short axis RIGHT lower paratracheal lymph node compares to 10 mm on prior. high RIGHT paratracheal lymph node measures 10 mm not changed 10 mm. Lungs/Pleura: There is volume loss in the RIGHT hemithorax with consolidation air bronchograms in the RIGHT lung apex.  This pattern is not changed from comparison exam. New pulmonary nodule in the LEFT lower lobe measuring 9 mm (image 93, series 7). More inferior 9 mm nodule on image 102, series 7 is new. Just above the LEFT diaphragm diaphragm new nodular thickening and bandlike and fluid pattern (image 121, series 7). The coalescing nodular thickening measures approximately 4 cm x 1 cm. Adjacent spiculated nodule measuring 20 mm compared to 20 mm on prior. Nodular thickening at the RIGHT lung base similar. Upper Abdomen: No focal hepatic lesion. Adrenal glands are normal. The venous collaterals noted in the gastrosplenic ligament Musculoskeletal: No aggressive osseous lesion IMPRESSION: 1. New nodules in the LEFT lower lobe differential including metastatic nodules or pulmonary infection. These nodules are coalesced at LEFT lung base. 2. Previous described nodules in LEFT lung are stable. 3. Stable post therapy change in the RIGHT lung with stable nodules. 4. Stable RIGHT hilar lymphadenopathy. 5. Consider FDG PET scan for further evaluation. Electronically Signed   By: SSuzy BouchardM.D.   On: 10/27/2016 08:14    ASSESSMENT AND PLAN: This is a very pleasant 76years old white male with: 1) metastatic non-small cell lung cancer, adenocarcinoma with no actionable mutations diagnosed in July 2016 as a stage IIIa status post a course of concurrent chemoradiation. He is currently undergoing treatment with immunotherapy with Nivolumab status post 20 cycles. Has been tolerating his treatment well with no significant adverse effects. The patient had a recent CT scan of the chest for restaging of his disease. I personally and independently reviewed the scan images and discuss the results with the patient and his daughter and showed them the images. He continues to have stable disease except for persistent left lower lobe nodularity as well as new 9 mm left lower lobe nodules. I had a lengthy discussion with the patient about  his condition and treatment options. I recommended for the patient to continue his current treatment with Nivolumab for now. We will repeat CT scan of the chest after cycle #24 and if there is any further evidence for disease progression or no improvement in the new pulmonary nodule, I would consider the patient for a PET scan and change of his treatment. He will proceed with cycle #21 today. 2) for the questionable inflammatory process in the left lower lobe, I started the patient on doxycycline 100 mg by mouth twice a day for 10 days. 3) smoke cessation: I strongly encouraged the patient to quit smoking and offered him a smoking cessation program. 4) hypertension: The patient was very anxious about his CT scan results today. I recommended for him to monitor his blood pressure closely at home and if it stayed elevated to consult with his primary care physician for consideration of treatment. 5) for the chemotherapy-induced  anemia: He will continue on ferrous sulfate. The patient would come back for follow-up visit in 2 weeks for evaluation before starting cycle #22. He was advised to call immediately if he has any concerning symptoms in the interval. The patient voices understanding of current disease status and treatment options and is in agreement with the current care plan.  All questions were answered. The patient knows to call the clinic with any problems, questions or concerns. We can certainly see the patient much sooner if necessary.  Disclaimer: This note was dictated with voice recognition software. Similar sounding words can inadvertently be transcribed and may not be corrected upon review.

## 2016-10-30 NOTE — Patient Instructions (Signed)
Steps to Quit Smoking Smoking tobacco can be bad for your health. It can also affect almost every organ in your body. Smoking puts you and people around you at risk for many serious long-lasting (chronic) diseases. Quitting smoking is hard, but it is one of the best things that you can do for your health. It is never too late to quit. What are the benefits of quitting smoking? When you quit smoking, you lower your risk for getting serious diseases and conditions. They can include:  Lung cancer or lung disease.  Heart disease.  Stroke.  Heart attack.  Not being able to have children (infertility).  Weak bones (osteoporosis) and broken bones (fractures). If you have coughing, wheezing, and shortness of breath, those symptoms may get better when you quit. You may also get sick less often. If you are pregnant, quitting smoking can help to lower your chances of having a baby of low birth weight. What can I do to help me quit smoking? Talk with your doctor about what can help you quit smoking. Some things you can do (strategies) include:  Quitting smoking totally, instead of slowly cutting back how much you smoke over a period of time.  Going to in-person counseling. You are more likely to quit if you go to many counseling sessions.  Using resources and support systems, such as:  Online chats with a counselor.  Phone quitlines.  Printed self-help materials.  Support groups or group counseling.  Text messaging programs.  Mobile phone apps or applications.  Taking medicines. Some of these medicines may have nicotine in them. If you are pregnant or breastfeeding, do not take any medicines to quit smoking unless your doctor says it is okay. Talk with your doctor about counseling or other things that can help you. Talk with your doctor about using more than one strategy at the same time, such as taking medicines while you are also going to in-person counseling. This can help make quitting  easier. What things can I do to make it easier to quit? Quitting smoking might feel very hard at first, but there is a lot that you can do to make it easier. Take these steps:  Talk to your family and friends. Ask them to support and encourage you.  Call phone quitlines, reach out to support groups, or work with a counselor.  Ask people who smoke to not smoke around you.  Avoid places that make you want (trigger) to smoke, such as:  Bars.  Parties.  Smoke-break areas at work.  Spend time with people who do not smoke.  Lower the stress in your life. Stress can make you want to smoke. Try these things to help your stress:  Getting regular exercise.  Deep-breathing exercises.  Yoga.  Meditating.  Doing a body scan. To do this, close your eyes, focus on one area of your body at a time from head to toe, and notice which parts of your body are tense. Try to relax the muscles in those areas.  Download or buy apps on your mobile phone or tablet that can help you stick to your quit plan. There are many free apps, such as QuitGuide from the CDC (Centers for Disease Control and Prevention). You can find more support from smokefree.gov and other websites. This information is not intended to replace advice given to you by your health care provider. Make sure you discuss any questions you have with your health care provider. Document Released: 08/11/2009 Document Revised: 06/12/2016 Document   Reviewed: 03/01/2015 Elsevier Interactive Patient Education  2017 Elsevier Inc.  

## 2016-11-02 ENCOUNTER — Telehealth: Payer: Self-pay | Admitting: Medical Oncology

## 2016-11-02 DIAGNOSIS — C3490 Malignant neoplasm of unspecified part of unspecified bronchus or lung: Secondary | ICD-10-CM

## 2016-11-02 MED ORDER — DOXYCYCLINE HYCLATE 100 MG PO TABS: 100.0000 mg | ORAL_TABLET | Freq: Two times a day (BID) | ORAL | 0 refills | Status: DC

## 2016-11-02 NOTE — Telephone Encounter (Signed)
Pt stated he put his doxycycline on top of a heated toaster oven w and damaged the medication. rx sent to his pharmacy

## 2016-11-12 ENCOUNTER — Other Ambulatory Visit (HOSPITAL_BASED_OUTPATIENT_CLINIC_OR_DEPARTMENT_OTHER): Payer: 59

## 2016-11-12 ENCOUNTER — Ambulatory Visit (HOSPITAL_BASED_OUTPATIENT_CLINIC_OR_DEPARTMENT_OTHER): Payer: 59

## 2016-11-12 ENCOUNTER — Ambulatory Visit (HOSPITAL_BASED_OUTPATIENT_CLINIC_OR_DEPARTMENT_OTHER): Payer: 59 | Admitting: Internal Medicine

## 2016-11-12 ENCOUNTER — Encounter: Payer: Self-pay | Admitting: Internal Medicine

## 2016-11-12 VITALS — BP 142/72 | HR 96 | Temp 97.7°F | Resp 18 | Ht 70.0 in | Wt 121.6 lb

## 2016-11-12 DIAGNOSIS — Z5112 Encounter for antineoplastic immunotherapy: Secondary | ICD-10-CM

## 2016-11-12 DIAGNOSIS — R0609 Other forms of dyspnea: Secondary | ICD-10-CM | POA: Diagnosis not present

## 2016-11-12 DIAGNOSIS — Z720 Tobacco use: Secondary | ICD-10-CM

## 2016-11-12 DIAGNOSIS — J449 Chronic obstructive pulmonary disease, unspecified: Secondary | ICD-10-CM

## 2016-11-12 DIAGNOSIS — Z79899 Other long term (current) drug therapy: Secondary | ICD-10-CM

## 2016-11-12 DIAGNOSIS — J209 Acute bronchitis, unspecified: Secondary | ICD-10-CM

## 2016-11-12 DIAGNOSIS — C3491 Malignant neoplasm of unspecified part of right bronchus or lung: Secondary | ICD-10-CM

## 2016-11-12 DIAGNOSIS — C3411 Malignant neoplasm of upper lobe, right bronchus or lung: Secondary | ICD-10-CM

## 2016-11-12 DIAGNOSIS — F17210 Nicotine dependence, cigarettes, uncomplicated: Secondary | ICD-10-CM

## 2016-11-12 DIAGNOSIS — I10 Essential (primary) hypertension: Secondary | ICD-10-CM | POA: Diagnosis not present

## 2016-11-12 LAB — COMPREHENSIVE METABOLIC PANEL
ALT: 25 U/L (ref 0–55)
ANION GAP: 9 meq/L (ref 3–11)
AST: 26 U/L (ref 5–34)
Albumin: 3.5 g/dL (ref 3.5–5.0)
Alkaline Phosphatase: 108 U/L (ref 40–150)
BUN: 34.7 mg/dL — ABNORMAL HIGH (ref 7.0–26.0)
CO2: 25 meq/L (ref 22–29)
Calcium: 9.7 mg/dL (ref 8.4–10.4)
Chloride: 104 mEq/L (ref 98–109)
Creatinine: 1.3 mg/dL (ref 0.7–1.3)
EGFR: 54 mL/min/{1.73_m2} — AB (ref >90)
Glucose: 89 mg/dl (ref 70–140)
Potassium: 4.6 mEq/L (ref 3.5–5.1)
SODIUM: 138 meq/L (ref 136–145)
TOTAL PROTEIN: 7.4 g/dL (ref 6.4–8.3)
Total Bilirubin: 0.38 mg/dL (ref 0.20–1.20)

## 2016-11-12 LAB — CBC WITH DIFFERENTIAL/PLATELET
BASO%: 0.4 % (ref 0.0–2.0)
Basophils Absolute: 0 10*3/uL (ref 0.0–0.1)
EOS%: 1.8 % (ref 0.0–7.0)
Eosinophils Absolute: 0.1 10*3/uL (ref 0.0–0.5)
HCT: 39.6 % (ref 38.4–49.9)
HGB: 13.1 g/dL (ref 13.0–17.1)
LYMPH%: 3.8 % — AB (ref 14.0–49.0)
MCH: 29.2 pg (ref 27.2–33.4)
MCHC: 33.1 g/dL (ref 32.0–36.0)
MCV: 88.2 fL (ref 79.3–98.0)
MONO#: 0.5 10*3/uL (ref 0.1–0.9)
MONO%: 6.3 % (ref 0.0–14.0)
NEUT%: 87.7 % — AB (ref 39.0–75.0)
NEUTROS ABS: 7 10*3/uL — AB (ref 1.5–6.5)
PLATELETS: 165 10*3/uL (ref 140–400)
RBC: 4.49 10*6/uL (ref 4.20–5.82)
RDW: 15.8 % — ABNORMAL HIGH (ref 11.0–14.6)
WBC: 8 10*3/uL (ref 4.0–10.3)
lymph#: 0.3 10*3/uL — ABNORMAL LOW (ref 0.9–3.3)

## 2016-11-12 LAB — TSH: TSH: 0.934 m(IU)/L (ref 0.320–4.118)

## 2016-11-12 MED ORDER — NIVOLUMAB CHEMO INJECTION 100 MG/10ML
240.0000 mg | Freq: Once | INTRAVENOUS | Status: AC
  Administered 2016-11-12: 240 mg via INTRAVENOUS
  Filled 2016-11-12: qty 4

## 2016-11-12 MED ORDER — AMOXICILLIN-POT CLAVULANATE 875-125 MG PO TABS: 1.0000 | ORAL_TABLET | Freq: Two times a day (BID) | ORAL | 0 refills | Status: DC

## 2016-11-12 MED ORDER — SODIUM CHLORIDE 0.9 % IV SOLN
Freq: Once | INTRAVENOUS | Status: AC
  Administered 2016-11-12: 15:00:00 via INTRAVENOUS

## 2016-11-12 NOTE — Patient Instructions (Signed)
[facility] Discharge Instructions for Patients Receiving Chemotherapy  Today you received the following chemotherapy agents:  Opdivo  To help prevent nausea and vomiting after your treatment, we encourage you to take your nausea medication as prescribed.   If you develop nausea and vomiting that is not controlled by your nausea medication, call the clinic.   BELOW ARE SYMPTOMS THAT SHOULD BE REPORTED IMMEDIATELY:  *FEVER GREATER THAN 100.5 F  *CHILLS WITH OR WITHOUT FEVER  NAUSEA AND VOMITING THAT IS NOT CONTROLLED WITH YOUR NAUSEA MEDICATION  *UNUSUAL SHORTNESS OF BREATH  *UNUSUAL BRUISING OR BLEEDING  TENDERNESS IN MOUTH AND THROAT WITH OR WITHOUT PRESENCE OF ULCERS  *URINARY PROBLEMS  *BOWEL PROBLEMS  UNUSUAL RASH Items with * indicate a potential emergency and should be followed up as soon as possible.  Feel free to call the clinic you have any questions or concerns. The clinic phone number is [phone].  Please show the CHEMO ALERT CARD at check-in to the Emergency Department and triage nurse.   

## 2016-11-12 NOTE — Patient Instructions (Signed)
Steps to Quit Smoking Smoking tobacco can be bad for your health. It can also affect almost every organ in your body. Smoking puts you and people around you at risk for many serious long-lasting (chronic) diseases. Quitting smoking is hard, but it is one of the best things that you can do for your health. It is never too late to quit. What are the benefits of quitting smoking? When you quit smoking, you lower your risk for getting serious diseases and conditions. They can include:  Lung cancer or lung disease.  Heart disease.  Stroke.  Heart attack.  Not being able to have children (infertility).  Weak bones (osteoporosis) and broken bones (fractures). If you have coughing, wheezing, and shortness of breath, those symptoms may get better when you quit. You may also get sick less often. If you are pregnant, quitting smoking can help to lower your chances of having a baby of low birth weight. What can I do to help me quit smoking? Talk with your doctor about what can help you quit smoking. Some things you can do (strategies) include:  Quitting smoking totally, instead of slowly cutting back how much you smoke over a period of time.  Going to in-person counseling. You are more likely to quit if you go to many counseling sessions.  Using resources and support systems, such as:  Online chats with a counselor.  Phone quitlines.  Printed self-help materials.  Support groups or group counseling.  Text messaging programs.  Mobile phone apps or applications.  Taking medicines. Some of these medicines may have nicotine in them. If you are pregnant or breastfeeding, do not take any medicines to quit smoking unless your doctor says it is okay. Talk with your doctor about counseling or other things that can help you. Talk with your doctor about using more than one strategy at the same time, such as taking medicines while you are also going to in-person counseling. This can help make quitting  easier. What things can I do to make it easier to quit? Quitting smoking might feel very hard at first, but there is a lot that you can do to make it easier. Take these steps:  Talk to your family and friends. Ask them to support and encourage you.  Call phone quitlines, reach out to support groups, or work with a counselor.  Ask people who smoke to not smoke around you.  Avoid places that make you want (trigger) to smoke, such as:  Bars.  Parties.  Smoke-break areas at work.  Spend time with people who do not smoke.  Lower the stress in your life. Stress can make you want to smoke. Try these things to help your stress:  Getting regular exercise.  Deep-breathing exercises.  Yoga.  Meditating.  Doing a body scan. To do this, close your eyes, focus on one area of your body at a time from head to toe, and notice which parts of your body are tense. Try to relax the muscles in those areas.  Download or buy apps on your mobile phone or tablet that can help you stick to your quit plan. There are many free apps, such as QuitGuide from the CDC (Centers for Disease Control and Prevention). You can find more support from smokefree.gov and other websites. This information is not intended to replace advice given to you by your health care provider. Make sure you discuss any questions you have with your health care provider. Document Released: 08/11/2009 Document Revised: 06/12/2016 Document   Reviewed: 03/01/2015 Elsevier Interactive Patient Education  2017 Elsevier Inc.  

## 2016-11-12 NOTE — Progress Notes (Signed)
[facility] [phone]   [phone]  OFFICE PROGRESS NOTE  Cammy Copa, MD [phone] [address], [address] [place] [address]  DIAGNOSIS: Progressive non-small cell lung cancer, adenocarcinoma initially diagnosed as a stage IIIa (T2a, N2, M0) in July 2016.  Foundation One molecular studies reveal that he was positive for [place], [place], and STAG2 S1053f*20. He was negative for: EGFR, ALK, BRAF, MET, RET, ERBB2 and ROS1  PRIOR THERAPY: 1) Concurrent chemoradiation with chemotherapy in the form of weekly carboplatin for AUC 2 paclitaxel 45 mg/m given concurrent with radiation therapy. First cycle started 05/09/2015. Status post 7 cycles. 2) Consolidation chemotherapy with carboplatin for AUC of 5 on day 1 and gemcitabine 1000 MG/M2 on days 1 and 8 every 3 weeks. First dose 08/15/2015. Discontinued secondary to intolerance.  CURRENT THERAPY: Immunotherapy with Nivolumab 240 mg IV every 2 weeks, status post 21 cycles.  INTERVAL HISTORY: Gabriel MULLARKEY89y.o. male returns to the clinic today for follow-up visit accompanied by his son and daughter. The patient continues to complain of cough productive of yellowish sputum as well as symptoms of acute bronchitis with pain in the center of the chest with the cough. He also continues to have low-grade fever but it was normal and the clinic today. He completed a course of 10 days of doxycycline with no significant improvement in his symptoms. He has shortness of breath at baseline and increased with exertion. He continues to work regularly. He has no nausea, vomiting, diarrhea or constipation. He has no significant weight loss or night sweats. He is here today for evaluation before his treatment with immunotherapy.   MEDICAL HISTORY: Past Medical History:  Diagnosis Date  . [place]    [place] cancer  . Constipation    AFTER ANESTHESIA  . Difficulty sleeping   . History of  nonmelanoma skin cancer   . Hypertension 10/30/2016  . Inguinal hernia   . Lung mass    right / HAS HAD RADIATION AND CHEMO FOR LUNG CANCER  . Pneumothorax on right 08/24/2015  . Productive cough    "DUE TO RADIATION"  . Radiation 05/11/15-06/21/15   NSCLCA/right lung  . Smoking 1/2 pack a day or less 09/17/2016  . Spontaneous pneumothorax MANY YRS AGO AND AGAIN 04/04/15   HISTORY OF (ONLY)    ALLERGIES:  is allergic to aleve [naproxen].  MEDICATIONS:  Current Outpatient Prescriptions  Medication Sig Dispense Refill  . acetaminophen (TYLENOL) 325 MG tablet 2 tablets    . albuterol (PROVENTIL HFA) 108 (90 Base) MCG/ACT inhaler 1-2 puffs as needed    . Arginine (RA L-ARGININE) 1000 MG TABS Take 500 mg by mouth daily.     . Ascorbic Acid (VITAMIN C) 1000 MG tablet Take 1,000 mg by mouth daily. Pt takes 2 5088mtabs    . B Complex Vitamins (B COMPLEX PO) Take 1 capsule by mouth daily.     . budesonide-formoterol (SYMBICORT) 160-4.5 MCG/ACT inhaler 2 puffs BID 1 Inhaler 5  . CALCIUM CITRATE PO Take 1 tablet by mouth daily.    . Cyanocobalamin (VITAMIN B 12 PO) Take 1 tablet by mouth daily.    . Marland Kitchenoxycycline (VIBRA-TABS) 100 MG tablet Take 1 tablet (100 mg total) by mouth 2 (two) times daily. 20 tablet 0  . ferrous sulfate 325 (65 FE) MG tablet Take 325 mg by mouth daily with breakfast.    . guaiFENesin (MUCINEX) 600 MG 12 hr tablet Take  1 tablet (600 mg total) by mouth 2 (two) times daily as needed.    . magic mouthwash SOLN Take 5 mLs by mouth 4 (four) times daily as needed.    . magnesium 30 MG tablet Take 30 mg by mouth daily.    . Magnesium Gluconate 550 MG TABS Take by mouth.    . Multiple Vitamin (MULTI VITAMIN DAILY PO) Take 1 tablet by mouth daily.    . Omega-3 Fatty Acids (FISH OIL) 1000 MG CAPS Take 1,000 mg by mouth daily.    Marland Kitchen omeprazole (PRILOSEC) 20 MG capsule     . Omeprazole (RA OMEPRAZOLE) 20 MG TBEC Take 20 mg by mouth.    . saccharomyces boulardii (FLORASTOR) 250 MG  capsule Take 250 mg by mouth 2 (two) times daily.    . vitamin A 10000 UNIT capsule 1 capsule with food or milk     No current facility-administered medications for this visit.     SURGICAL HISTORY:  Past Surgical History:  Procedure Laterality Date  . CHEST TUBE INSERTION Right 08/02/2015   Procedure: INSERTION OF SECOND RIGHT CHEST TUBE WITH FLUROSCOPY;  Surgeon: Grace Isaac, MD;  Location: [facility];  Service: Thoracic;  Laterality: Right;  . CHEST TUBE INSERTION  07/28/2015  . COLONOSCOPY W/ BIOPSIES AND POLYPECTOMY    . HERNIA REPAIR  2011  . INGUINAL HERNIA REPAIR Left 07/15/2015   Procedure: OPEN LEFT INGUINAL HERNIA REPAIR;  Surgeon: Johnathan Hausen, MD;  Location: [facility];  Service: General;  Laterality: Left;  With MESH  . TONSILLECTOMY    . VIDEO BRONCHOSCOPY WITH ENDOBRONCHIAL ULTRASOUND N/A 04/18/2015   Procedure: VIDEO BRONCHOSCOPY WITH ENDOBRONCHIAL ULTRASOUND;  Surgeon: Grace Isaac, MD;  Location: [facility];  Service: Thoracic;  Laterality: N/A;  . VIDEO BRONCHOSCOPY WITH INSERTION OF INTERBRONCHIAL VALVE (IBV) N/A 08/02/2015   Procedure: VIDEO BRONCHOSCOPY WITH ATTEMPTED INSERTION OF INTERBRONCHIAL VALVE (IBV) WITH FLUROSCOPY;  Surgeon: Grace Isaac, MD;  Location: [facility];  Service: Thoracic;  Laterality: N/A;  . WISDOM TOOTH EXTRACTION      REVIEW OF SYSTEMS:  Constitutional: positive for fatigue and fevers Eyes: negative Ears, nose, mouth, throat, and face: negative Respiratory: positive for cough, dyspnea on exertion, pleurisy/chest pain and wheezing Cardiovascular: negative Gastrointestinal: negative Genitourinary:negative Integument/breast: negative Hematologic/lymphatic: negative Musculoskeletal:negative Neurological: negative Behavioral/Psych: negative Endocrine: negative Allergic/Immunologic: negative   PHYSICAL EXAMINATION: General appearance: alert, cooperative, fatigued and no distress Head: Normocephalic, without obvious abnormality,  atraumatic Neck: no adenopathy, no JVD, supple, symmetrical, trachea midline and thyroid not enlarged, symmetric, no tenderness/mass/nodules Lymph nodes: Cervical, supraclavicular, and axillary nodes normal. Resp: rhonchi bilaterally and wheezes bilaterally Back: symmetric, no curvature. ROM normal. No CVA tenderness. Cardio: regular rate and rhythm, S1, S2 normal, no murmur, click, rub or gallop GI: soft, non-tender; bowel sounds normal; no masses,  no organomegaly Extremities: extremities normal, atraumatic, no cyanosis or edema Neurologic: Alert and oriented X 3, normal strength and tone. Normal symmetric reflexes. Normal coordination and gait  ECOG PERFORMANCE STATUS: 1 - Symptomatic but completely ambulatory  Blood pressure (!) 142/72, pulse 96, temperature 97.7 F (36.5 C), temperature source Oral, resp. rate 18, height 5' 10"  (1.778 m), weight 121 lb 9.6 oz (55.2 kg), SpO2 95 %.  LABORATORY DATA: Lab Results  Component Value Date   WBC 8.0 11/12/2016   HGB 13.1 11/12/2016   HCT 39.6 11/12/2016   MCV 88.2 11/12/2016   PLT 165 11/12/2016      Chemistry      Component  Value Date/Time   NA 140 10/30/2016 1320   K 4.0 10/30/2016 1320   CL 102 09/12/2015 0410   CO2 25 10/30/2016 1320   BUN 25.4 10/30/2016 1320   CREATININE 0.9 10/30/2016 1320      Component Value Date/Time   CALCIUM 10.0 10/30/2016 1320   ALKPHOS 133 10/30/2016 1320   AST 21 10/30/2016 1320   ALT 21 10/30/2016 1320   BILITOT 0.27 10/30/2016 1320       RADIOGRAPHIC STUDIES: Ct Chest W Contrast  Result Date: 10/27/2016 CLINICAL DATA:  Non-small cell lung cancer. Restaging exam. Immunotherapy ongoing. EXAM: CT CHEST WITH CONTRAST TECHNIQUE: Multidetector CT imaging of the chest was performed during intravenous contrast administration. CONTRAST:  32m ISOVUE-300 IOPAMIDOL (ISOVUE-300) INJECTION 61% COMPARISON:  CT 08/31/2016 FINDINGS: Cardiovascular: Coronary artery calcification and aortic  atherosclerotic calcification. No pericardial fluid. Mediastinum/Nodes: No axillary or supraclavicular lymphadenopathy. RIGHT lower paratracheal lymph nodes again noted. For example 10 mm short axis RIGHT lower paratracheal lymph node compares to 10 mm on prior. high RIGHT paratracheal lymph node measures 10 mm not changed 10 mm. Lungs/Pleura: There is volume loss in the RIGHT hemithorax with consolidation air bronchograms in the RIGHT lung apex. This pattern is not changed from comparison exam. New pulmonary nodule in the LEFT lower lobe measuring 9 mm (image 93, series 7). More inferior 9 mm nodule on image 102, series 7 is new. Just above the LEFT diaphragm diaphragm new nodular thickening and bandlike and fluid pattern (image 121, series 7). The coalescing nodular thickening measures approximately 4 cm x 1 cm. Adjacent spiculated nodule measuring 20 mm compared to 20 mm on prior. Nodular thickening at the RIGHT lung base similar. Upper Abdomen: No focal hepatic lesion. Adrenal glands are normal. The venous collaterals noted in the gastrosplenic ligament Musculoskeletal: No aggressive osseous lesion IMPRESSION: 1. New nodules in the LEFT lower lobe differential including metastatic nodules or pulmonary infection. These nodules are coalesced at LEFT lung base. 2. Previous described nodules in LEFT lung are stable. 3. Stable post therapy change in the RIGHT lung with stable nodules. 4. Stable RIGHT hilar lymphadenopathy. 5. Consider FDG PET scan for further evaluation. Electronically Signed   By: SSuzy BouchardM.D.   On: 10/27/2016 08:14    ASSESSMENT AND PLAN:  This is a very pleasant 76years old white male with: 1) metastatic non-small cell lung cancer, adenocarcinoma with no target mutations. He was initially diagnosed as a stage IIIa non-small cell lung cancer and underwent a course of concurrent chemoradiation with partial response followed by progression of his disease. He is currently on treatment  with immunotherapy with Nivolumab status post 21 cycles. He continues to tolerate his treatment well. I recommended for him to proceed with cycle #22 today as scheduled. 2) acute bronchitis with inflammatory lung disease. He was treated with a course of doxycycline 100 mg by mouth twice a day for 10 days but no improvement in his condition. I recommended for the patient to start treatment with Augmentin 875 mg by mouth twice a day for 7 days. If no improvement in his symptoms, we will consider the patient for repeat CT scan or even PET scan for further evaluation of his disease and to rule out immunotherapy induced pneumonitis or disease progression. 3) smoke cessation, strongly encouraged the patient and several occasional to quit smoking. 4) hypertension: His systolic blood pressure is mildly elevated. He will continue on his current treatment. He would come back for follow-up visit in 2 weeks  or sooner if needed. The patient was advised to call immediately if he has any concerning symptoms in the interval. The patient voices understanding of current disease status and treatment options and is in agreement with the current care plan.  All questions were answered. The patient knows to call the clinic with any problems, questions or concerns. We can certainly see the patient much sooner if necessary.  Disclaimer: This note was dictated with voice recognition software. Similar sounding words can inadvertently be transcribed and may not be corrected upon review.

## 2016-11-17 ENCOUNTER — Ambulatory Visit: Payer: 59

## 2016-11-19 ENCOUNTER — Other Ambulatory Visit: Payer: Self-pay | Admitting: Medical Oncology

## 2016-11-19 ENCOUNTER — Telehealth: Payer: Self-pay

## 2016-11-19 DIAGNOSIS — C3491 Malignant neoplasm of unspecified part of right bronchus or lung: Secondary | ICD-10-CM

## 2016-11-19 NOTE — Telephone Encounter (Signed)
Per Julien Nordmann , I told pt that he ordered CT angio for tomorrow

## 2016-11-19 NOTE — Telephone Encounter (Signed)
Pt stated on 1/15 Dr Julien Nordmann told him to call if he is not feeling any better. Called pt back. He finished his Augmentin today. He is still running a low grade fever, he states his normal is about 97.9, and he is running about 98.8. Still with worse than normal SOB.

## 2016-11-20 ENCOUNTER — Encounter (HOSPITAL_COMMUNITY): Payer: Self-pay

## 2016-11-20 ENCOUNTER — Ambulatory Visit (HOSPITAL_COMMUNITY)
Admission: RE | Admit: 2016-11-20 | Discharge: 2016-11-20 | Disposition: A | Discharge status: Home / Self Care | Payer: 59 | Source: Ambulatory Visit | Attending: Internal Medicine | Admitting: Internal Medicine

## 2016-11-20 DIAGNOSIS — R509 Fever, unspecified: Secondary | ICD-10-CM | POA: Insufficient documentation

## 2016-11-20 DIAGNOSIS — R0602 Shortness of breath: Secondary | ICD-10-CM | POA: Insufficient documentation

## 2016-11-20 DIAGNOSIS — C3491 Malignant neoplasm of unspecified part of right bronchus or lung: Secondary | ICD-10-CM

## 2016-11-20 MED ORDER — IOPAMIDOL (ISOVUE-370) INJECTION 76%
INTRAVENOUS | Status: AC
  Administered 2016-11-20: 80 mL
  Filled 2016-11-20: qty 100

## 2016-11-21 ENCOUNTER — Telehealth: Payer: Self-pay | Admitting: *Deleted

## 2016-11-21 NOTE — Telephone Encounter (Signed)
Pt called with request for CT results. Reviewed with MD. Discussed with pt no evidence of disease progression, no PE. Pt verbalized understanding and questioned why is he still having shortness of breath that is getting worse; not better. Again reviewed with MD, who advised no evidence of Pneumonitis, however pt could stop treatment for several weeks, start steroid taper and see if symptoms get better or be referred to pulmonologist for further evaluation. Pt agreed he would like to see Pulmonologist as he is currently seen by Dr. Golden Pop office. Informed pt I will call office and try to get pt an appt to be seen. Pt verbalized understanding.

## 2016-11-22 ENCOUNTER — Ambulatory Visit: Payer: 59

## 2016-11-22 NOTE — Telephone Encounter (Signed)
Called Pulmonology, pt to be further evaluated for increased shortness of breath on Monday 1/29 at 930 with Eric Form, NP. Pt notified of appt and verbalized understanding.

## 2016-11-26 ENCOUNTER — Other Ambulatory Visit (HOSPITAL_BASED_OUTPATIENT_CLINIC_OR_DEPARTMENT_OTHER): Payer: 59

## 2016-11-26 ENCOUNTER — Ambulatory Visit (INDEPENDENT_AMBULATORY_CARE_PROVIDER_SITE_OTHER)
Admission: RE | Admit: 2016-11-26 | Discharge: 2016-11-26 | Disposition: A | Discharge status: Home / Self Care | Payer: 59 | Source: Ambulatory Visit | Attending: Acute Care | Admitting: Acute Care

## 2016-11-26 ENCOUNTER — Ambulatory Visit (INDEPENDENT_AMBULATORY_CARE_PROVIDER_SITE_OTHER): Payer: 59 | Admitting: Acute Care

## 2016-11-26 ENCOUNTER — Encounter: Payer: Self-pay | Admitting: Internal Medicine

## 2016-11-26 ENCOUNTER — Encounter: Payer: Self-pay | Admitting: Acute Care

## 2016-11-26 ENCOUNTER — Ambulatory Visit (HOSPITAL_BASED_OUTPATIENT_CLINIC_OR_DEPARTMENT_OTHER): Payer: 59 | Admitting: Internal Medicine

## 2016-11-26 ENCOUNTER — Ambulatory Visit: Payer: 59

## 2016-11-26 ENCOUNTER — Telehealth: Payer: Self-pay | Admitting: Medical Oncology

## 2016-11-26 VITALS — BP 159/87 | HR 91 | Temp 98.0°F | Resp 18 | Wt 117.1 lb

## 2016-11-26 VITALS — BP 130/80 | HR 95 | Ht 70.0 in | Wt 120.0 lb

## 2016-11-26 DIAGNOSIS — Z79899 Other long term (current) drug therapy: Secondary | ICD-10-CM

## 2016-11-26 DIAGNOSIS — I10 Essential (primary) hypertension: Secondary | ICD-10-CM

## 2016-11-26 DIAGNOSIS — J441 Chronic obstructive pulmonary disease with (acute) exacerbation: Secondary | ICD-10-CM

## 2016-11-26 DIAGNOSIS — C3491 Malignant neoplasm of unspecified part of right bronchus or lung: Secondary | ICD-10-CM

## 2016-11-26 DIAGNOSIS — D649 Anemia, unspecified: Secondary | ICD-10-CM

## 2016-11-26 DIAGNOSIS — E46 Unspecified protein-calorie malnutrition: Secondary | ICD-10-CM | POA: Insufficient documentation

## 2016-11-26 DIAGNOSIS — F17210 Nicotine dependence, cigarettes, uncomplicated: Secondary | ICD-10-CM | POA: Diagnosis not present

## 2016-11-26 DIAGNOSIS — Z720 Tobacco use: Secondary | ICD-10-CM

## 2016-11-26 DIAGNOSIS — R0609 Other forms of dyspnea: Secondary | ICD-10-CM | POA: Diagnosis not present

## 2016-11-26 DIAGNOSIS — E440 Moderate protein-calorie malnutrition: Secondary | ICD-10-CM

## 2016-11-26 LAB — CBC WITH DIFFERENTIAL/PLATELET
BASO%: 0.7 % (ref 0.0–2.0)
Basophils Absolute: 0.1 10*3/uL (ref 0.0–0.1)
EOS%: 0.7 % (ref 0.0–7.0)
Eosinophils Absolute: 0.1 10*3/uL (ref 0.0–0.5)
HCT: 42.2 % (ref 38.4–49.9)
HEMOGLOBIN: 14 g/dL (ref 13.0–17.1)
LYMPH%: 7.1 % — AB (ref 14.0–49.0)
MCH: 29 pg (ref 27.2–33.4)
MCHC: 33.3 g/dL (ref 32.0–36.0)
MCV: 87.3 fL (ref 79.3–98.0)
MONO#: 0.6 10*3/uL (ref 0.1–0.9)
MONO%: 5.5 % (ref 0.0–14.0)
NEUT%: 86 % — ABNORMAL HIGH (ref 39.0–75.0)
NEUTROS ABS: 10.1 10*3/uL — AB (ref 1.5–6.5)
Platelets: 358 10*3/uL (ref 140–400)
RBC: 4.84 10*6/uL (ref 4.20–5.82)
RDW: 16 % — AB (ref 11.0–14.6)
WBC: 11.7 10*3/uL — AB (ref 4.0–10.3)
lymph#: 0.8 10*3/uL — ABNORMAL LOW (ref 0.9–3.3)

## 2016-11-26 LAB — COMPREHENSIVE METABOLIC PANEL
ALBUMIN: 3.9 g/dL (ref 3.5–5.0)
ALK PHOS: 120 U/L (ref 40–150)
ALT: 25 U/L (ref 0–55)
AST: 25 U/L (ref 5–34)
Anion Gap: 10 mEq/L (ref 3–11)
BUN: 34.5 mg/dL — AB (ref 7.0–26.0)
CO2: 23 meq/L (ref 22–29)
Calcium: 9.9 mg/dL (ref 8.4–10.4)
Chloride: 105 mEq/L (ref 98–109)
Creatinine: 1.2 mg/dL (ref 0.7–1.3)
EGFR: 59 mL/min/{1.73_m2} — ABNORMAL LOW (ref >90)
GLUCOSE: 106 mg/dL (ref 70–140)
Potassium: 4.3 mEq/L (ref 3.5–5.1)
SODIUM: 138 meq/L (ref 136–145)
Total Bilirubin: 0.47 mg/dL (ref 0.20–1.20)
Total Protein: 8 g/dL (ref 6.4–8.3)

## 2016-11-26 LAB — TSH: TSH: 0.941 m(IU)/L (ref 0.320–4.118)

## 2016-11-26 MED ORDER — PREDNISONE 10 MG (21) PO TBPK: ORAL_TABLET | ORAL | 0 refills | Status: DC

## 2016-11-26 MED ORDER — PREDNISONE 20 MG PO TABS: ORAL_TABLET | ORAL | 0 refills | Status: DC

## 2016-11-26 NOTE — Patient Instructions (Addendum)
CXR today stat. We will call you with the result. Prednisone taper 30 mg x 2 days, 20 mg x 2 days, 10 mg x 2 days then stop. Please check with Dr. Earlie Server prior to starting. Use your rescue inhaler as needed for shortness of breath up to every 6 hours . Continue using your Symbicort daily as you have been doing. Rinse mouth after use. Continue using your Mucinex daily as you have been doing. Follow up in 2 weeks with either Dr. Chase Caller or Eric Form, NP. Please contact office for sooner follow up if symptoms do not improve or worsen or seek emergency care

## 2016-11-26 NOTE — Telephone Encounter (Signed)
Faxed prednisone rx to local pharmacy

## 2016-11-26 NOTE — Patient Instructions (Signed)
Steps to Quit Smoking Smoking tobacco can be bad for your health. It can also affect almost every organ in your body. Smoking puts you and people around you at risk for many serious long-lasting (chronic) diseases. Quitting smoking is hard, but it is one of the best things that you can do for your health. It is never too late to quit. What are the benefits of quitting smoking? When you quit smoking, you lower your risk for getting serious diseases and conditions. They can include:  Lung cancer or lung disease.  Heart disease.  Stroke.  Heart attack.  Not being able to have children (infertility).  Weak bones (osteoporosis) and broken bones (fractures). If you have coughing, wheezing, and shortness of breath, those symptoms may get better when you quit. You may also get sick less often. If you are pregnant, quitting smoking can help to lower your chances of having a baby of low birth weight. What can I do to help me quit smoking? Talk with your doctor about what can help you quit smoking. Some things you can do (strategies) include:  Quitting smoking totally, instead of slowly cutting back how much you smoke over a period of time.  Going to in-person counseling. You are more likely to quit if you go to many counseling sessions.  Using resources and support systems, such as:  Online chats with a counselor.  Phone quitlines.  Printed self-help materials.  Support groups or group counseling.  Text messaging programs.  Mobile phone apps or applications.  Taking medicines. Some of these medicines may have nicotine in them. If you are pregnant or breastfeeding, do not take any medicines to quit smoking unless your doctor says it is okay. Talk with your doctor about counseling or other things that can help you. Talk with your doctor about using more than one strategy at the same time, such as taking medicines while you are also going to in-person counseling. This can help make quitting  easier. What things can I do to make it easier to quit? Quitting smoking might feel very hard at first, but there is a lot that you can do to make it easier. Take these steps:  Talk to your family and friends. Ask them to support and encourage you.  Call phone quitlines, reach out to support groups, or work with a counselor.  Ask people who smoke to not smoke around you.  Avoid places that make you want (trigger) to smoke, such as:  Bars.  Parties.  Smoke-break areas at work.  Spend time with people who do not smoke.  Lower the stress in your life. Stress can make you want to smoke. Try these things to help your stress:  Getting regular exercise.  Deep-breathing exercises.  Yoga.  Meditating.  Doing a body scan. To do this, close your eyes, focus on one area of your body at a time from head to toe, and notice which parts of your body are tense. Try to relax the muscles in those areas.  Download or buy apps on your mobile phone or tablet that can help you stick to your quit plan. There are many free apps, such as QuitGuide from the CDC (Centers for Disease Control and Prevention). You can find more support from smokefree.gov and other websites. This information is not intended to replace advice given to you by your health care provider. Make sure you discuss any questions you have with your health care provider. Document Released: 08/11/2009 Document Revised: 06/12/2016 Document   Reviewed: 03/01/2015 Elsevier Interactive Patient Education  2017 Elsevier Inc.  

## 2016-11-26 NOTE — Progress Notes (Signed)
History of Present Illness Gabriel [name] is a 76 y.o. 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 He is followed by Dr. Chase Caller.    DIAGNOSIS: Progressive non-small cell lung cancer, adenocarcinoma initially diagnosed as a stage IIIA (T2a, N2, M0) in June 2016.  Foundation One molecular studies reveal that he was positive for [place], [place], and STAG2 S1056f*20. He was negative for: EGFR, ALK, BRAF, MET, RET, ERBB2 and ROS1  PRIOR THERAPY: 1) Concurrent chemoradiation with chemotherapy in the form of weekly carboplatin for AUC 2 paclitaxel 45 mg/m given concurrent with radiation therapy. First cycle started 05/09/2015. Status post 7 cycles. 2) Consolidation chemotherapy with carboplatin for AUC of 5 on day 1 and gemcitabine 1000 MG/M2 on days 1 and 8 every 3 weeks. First dose 08/15/2015. Discontinued secondary to intolerance.  CURRENT THERAPY: Immunotherapy with Nivolumab 240 MG IV every 2 weeks, status post 18 cycles. First dose was given 01/18/2016.   11/26/2016 Acute OV: Pt. Presents for acute OV for slow to resolve bronchitis.He states that this exacerbation started 3 weeks ago. He has been treated with Doxycycline x 10 days  and Augmentin x 7 days by his PCP and Oncologist without resolution.. He has had fever of 98.8. His normal temp is 97.8., and he is aware he is running about 1 degree above his baseline.His oxygen saturation of 99% today on room air. His primary complaint is of shortness of breath with exertion.. He has continued complaint of fatigue which he states has been ongoing since he started the immunotherapy.He notes his exertional shortness of breath has been worse since 08/2015 post hospitalization, but this was a marked increase in shortness of breath since bronchitis 3 weeks ago. He is no longer on oxygen.He is  compliant with his Symbicort twice daily, and Mucinex DM 600 mg twice daily. He has a rescue inhaler, but he is not using it.He is not using his nebulizer treatments. I have spoken with Dr. RChase Callerabout [name] He reviewed the CXR and the CT Angio done 11/20/2016. He feels this should be treated as a COPD flare, and suggested prednisone taper. The patient feels Dr. MEarlie Serverdoes not want him taking prednisone, so he is going to discuss it with him today at his appointment. His cough is productive for whitish secretions, but he states that his secretions have diminished significantly since completing 2 rounds of antibiotics over the last 3 weeks.He denies chest pain, orthopnea or hemoptysis. No recent car of airline travel.  Tests 11/02/2016 CXR:  IMPRESSION: Persistent cavitary lesion within the right hemi thorax, showing little interval change.  CT Angio 11/20/2016 IMPRESSION: 1. No CT findings for pulmonary embolism. 2. Stable radiation changes involving the right lung and stable pulmonary metastatic disease. 3. Interval partial clearing of the left lower lobe inflammatory or infectious process. 4. Stable mediastinal/right hilar lymph nodes and supraclavicular lymph nodes. 5. No findings for upper abdominal metastatic disease.  11/26/2016: CXR:  Past medical hx Past Medical History:  Diagnosis Date  . [place]    [place] cancer  . Constipation    AFTER ANESTHESIA  . Difficulty sleeping   . History of nonmelanoma skin cancer   . Hypertension 10/30/2016  . Inguinal hernia   . Lung mass    right / HAS HAD RADIATION AND CHEMO FOR LUNG CANCER  . Pneumothorax on right 08/24/2015  .  Productive cough    "DUE TO RADIATION"  . Radiation 05/11/15-06/21/15   NSCLCA/right lung  . Smoking 1/2 pack a day or less 09/17/2016  . Spontaneous pneumothorax MANY YRS AGO AND AGAIN 04/04/15   HISTORY OF (ONLY)     Past surgical hx, Family hx, Social hx all reviewed.  Current Outpatient  Prescriptions on File Prior to Visit  Medication Sig  . acetaminophen (TYLENOL) 325 MG tablet 2 tablets  . albuterol (PROVENTIL HFA) 108 (90 Base) MCG/ACT inhaler 1-2 puffs as needed  . Arginine (RA L-ARGININE) 1000 MG TABS Take 500 mg by mouth daily.   . Ascorbic Acid (VITAMIN C) 1000 MG tablet Take 1,000 mg by mouth daily. Pt takes 2 553m tabs  . B Complex Vitamins (B COMPLEX PO) Take 1 capsule by mouth daily.   . budesonide-formoterol (SYMBICORT) 160-4.5 MCG/ACT inhaler 2 puffs BID  . CALCIUM CITRATE PO Take 1 tablet by mouth daily.  . Cyanocobalamin (VITAMIN B 12 PO) Take 1 tablet by mouth daily.  . ferrous sulfate 325 (65 FE) MG tablet Take 325 mg by mouth daily with breakfast.  . guaiFENesin (MUCINEX) 600 MG 12 hr tablet Take 1 tablet (600 mg total) by mouth 2 (two) times daily as needed.  . magic mouthwash SOLN Take 5 mLs by mouth 4 (four) times daily as needed.  . magnesium 30 MG tablet Take 30 mg by mouth daily.  . Magnesium Gluconate 550 MG TABS Take by mouth.  . Multiple Vitamin (MULTI VITAMIN DAILY PO) Take 1 tablet by mouth daily.  . Omega-3 Fatty Acids (FISH OIL) 1000 MG CAPS Take 1,000 mg by mouth daily.  .Marland Kitchenomeprazole (PRILOSEC) 20 MG capsule   . saccharomyces boulardii (FLORASTOR) 250 MG capsule Take 250 mg by mouth 2 (two) times daily.  . vitamin A 10000 UNIT capsule 1 capsule with food or milk  . Omeprazole (RA OMEPRAZOLE) 20 MG TBEC Take 20 mg by mouth.   No current facility-administered medications on file prior to visit.      Allergies  Allergen Reactions  . Aleve [Naproxen] Rash    Review Of Systems:  Constitutional:   +  weight loss, night sweats, + low grade Fevers, no chills, + fatigue, or  lassitude.  HEENT:   No headaches,  Difficulty swallowing,  Tooth/dental problems, or  Sore throat,                No sneezing, itching, ear ache, nasal congestion, post nasal drip,   CV:  No chest pain,  Orthopnea, PND, swelling in lower extremities, anasarca,  dizziness, palpitations, syncope.   GI  No heartburn, indigestion, abdominal pain, nausea, vomiting, diarrhea, change in bowel habits,+ loss of appetite, bloody stools.   Resp: + shortness of breath with exertion not  at rest.  + excess mucus but getting better, + productive cough,  No non-productive cough,  No coughing up of blood.  No change in color of mucus.  ++ wheezing.  No chest wall deformity  Skin: no rash or lesions.  GU: no dysuria, change in color of urine, no urgency or frequency.  No flank pain, no hematuria   MS:  No joint pain or swelling.  No decreased range of motion.  + back pain.  Psych:  No change in mood or affect. No depression or anxiety.  No memory loss.   Vital Signs BP 130/80 (BP Location: Left Arm, Cuff Size: Normal)   Pulse 95   Ht 5' 10"  (1.778 m)  Wt 120 lb (54.4 kg)   SpO2 99%   BMI 17.22 kg/m    Physical Exam:  General- No distress,  A&Ox3, pleasant thin nale ENT: No sinus tenderness, TM clear, pale nasal mucosa, no oral exudate,no post nasal drip, no LAN Cardiac: S1, S2, regular rate and rhythm, no murmur Chest: ++ wheeze/no  rales/ dullness; no accessory muscle use, no nasal flaring, no sternal retractions Abd.: Soft Non-tender, flat Ext: No clubbing cyanosis, edema Neuro:  normal strength, but somewhat deconditioned. Skin: No rashes, warm and dry Psych: normal mood and behavior   Assessment/Plan  COPD exacerbation (HCC) COPD Flare post bronchitis: Plan CXR today stat. We will call you with the result. Prednisone taper 30 mg x 2 days, 20 mg x 2 days, 10 mg x 2 days then stop. Please check with Dr. Earlie Server prior to starting. Use your rescue inhaler as needed for shortness of breath up to every 6 hours . If inhaler does not relieve symptoms, you may escalate to nebulizer treatments. Continue using your Symbicort daily as you have been doing every day. Rinse mouth after use. Continue using your Mucinex daily as you have been  doing. Follow up in 2 weeks with either Dr. Chase Caller or Eric Form, NP. Please contact office for sooner follow up if symptoms do not improve or worsen or seek emergency care    Smoking 1/2 pack a day or less Continue working on quitting smoking.   Protein-calorie malnutrition ([place]) Continue to monitor your weight Add Boost or Ensure meal supplements as able.    Magdalen Spatz, NP 11/26/2016  10:30 AM

## 2016-11-26 NOTE — Progress Notes (Signed)
    [facility] [phone]   [phone]  OFFICE PROGRESS NOTE  THACKER,ROBERT KELLER, MD [address] [place] [address]  DIAGNOSIS: Progressive non-small cell lung cancer, adenocarcinoma initially diagnosed as a stage IIIa (T2a, N2, M0) in July 2016.  Foundation One molecular studies reveal that he was positive for KRAS G12V, ARIDA [id number], and STAG2 S1078fs*20. He was negative for: EGFR, ALK, BRAF, MET, RET, ERBB2 and ROS1  PRIOR THERAPY: 1) Concurrent chemoradiation with chemotherapy in the form of weekly carboplatin for AUC 2 paclitaxel 45 mg/m given concurrent with radiation therapy. First cycle started 05/09/2015. Status post 7 cycles. 2) Consolidation chemotherapy with carboplatin for AUC of 5 on day 1 and gemcitabine 1000 MG/M2 on days 1 and 8 every 3 weeks. First dose 08/15/2015. Discontinued secondary to intolerance.  CURRENT THERAPY: Immunotherapy with Nivolumab 240 mg IV every 2 weeks, status post 22 cycles.  INTERVAL HISTORY: Gabriel [name] 76 y.o. male came to the clinic today accompanied by [name]. The patient continues to complain of shortness of breath and cough as well as bilateral wheezes. He denied having any chest pain or hemoptysis. He has no fever or chills. He has no nausea, vomiting, diarrhea or constipation. He lost 3 pounds since his last visit. He tolerated the last cycle of his treatment with Nivolumab fairly well with no significant skin rash or diarrhea. He was treated in the past with 2 courses of antibiotics for questionable inflammatory process of the lung. He did not notice any improvement of his symptoms. He had repeat CT angiogram of the chest recently and he is here for evaluation and discussion of his scan results and further recommendation regarding his condition.   MEDICAL HISTORY: Past Medical History:  Diagnosis Date  . Cancer (HCC)    skin cancer  . Constipation    AFTER  ANESTHESIA  . Difficulty sleeping   . History of nonmelanoma skin cancer   . Hypertension 10/30/2016  . Inguinal hernia   . Lung mass    right / HAS HAD RADIATION AND CHEMO FOR LUNG CANCER  . Pneumothorax on right 08/24/2015  . Productive cough    "DUE TO RADIATION"  . Radiation 05/11/15-06/21/15   NSCLCA/right lung  . Smoking 1/2 pack a day or less 09/17/2016  . Spontaneous pneumothorax MANY YRS AGO AND AGAIN 04/04/15   HISTORY OF (ONLY)    ALLERGIES:  is allergic to aleve [naproxen].  MEDICATIONS:  Current Outpatient Prescriptions  Medication Sig Dispense Refill  . acetaminophen (TYLENOL) 325 MG tablet 2 tablets    . albuterol (PROVENTIL HFA) 108 (90 Base) MCG/ACT inhaler 1-2 puffs as needed    . Arginine (RA L-ARGININE) 1000 MG TABS Take 500 mg by mouth daily.     . Ascorbic Acid (VITAMIN C) 1000 MG tablet Take 1,000 mg by mouth daily. Pt takes 2 500mg tabs    . B Complex Vitamins (B COMPLEX PO) Take 1 capsule by mouth daily.     . budesonide-formoterol (SYMBICORT) 160-4.5 MCG/ACT inhaler 2 puffs BID 1 Inhaler 5  . CALCIUM CITRATE PO Take 1 tablet by mouth daily.    . Cyanocobalamin (VITAMIN B 12 PO) Take 1 tablet by mouth daily.    . ferrous sulfate 325 (65 FE) MG tablet Take 325 mg by mouth daily with breakfast.    . guaiFENesin (MUCINEX) 600 MG 12 hr tablet Take 1 tablet (600 mg total) by mouth 2 (two) times daily   as needed.    . magic mouthwash SOLN Take 5 mLs by mouth 4 (four) times daily as needed.    . magnesium 30 MG tablet Take 30 mg by mouth daily.    . Magnesium Gluconate 550 MG TABS Take by mouth.    . Multiple Vitamin (MULTI VITAMIN DAILY PO) Take 1 tablet by mouth daily.    . Omega-3 Fatty Acids (FISH OIL) 1000 MG CAPS Take 1,000 mg by mouth daily.    . omeprazole (PRILOSEC) 20 MG capsule     . Omeprazole (RA OMEPRAZOLE) 20 MG TBEC Take 20 mg by mouth.    . predniSONE (STERAPRED UNI-PAK 21 TAB) 10 MG (21) TBPK tablet Take 3 tabs for 2 days, take 2 tabs for 2 days,  then 1 tab for 2 days, then stop. 12 tablet 0  . saccharomyces boulardii (FLORASTOR) 250 MG capsule Take 250 mg by mouth 2 (two) times daily.    . vitamin A 10000 UNIT capsule 1 capsule with food or milk     No current facility-administered medications for this visit.     SURGICAL HISTORY:  Past Surgical History:  Procedure Laterality Date  . CHEST TUBE INSERTION Right 08/02/2015   Procedure: INSERTION OF SECOND RIGHT CHEST TUBE WITH FLUROSCOPY;  Surgeon: Edward B Gerhardt, MD;  Location: [facility] OR;  Service: Thoracic;  Laterality: Right;  . CHEST TUBE INSERTION  07/28/2015  . COLONOSCOPY W/ BIOPSIES AND POLYPECTOMY    . HERNIA REPAIR  2011  . INGUINAL HERNIA REPAIR Left 07/15/2015   Procedure: OPEN LEFT INGUINAL HERNIA REPAIR;  Surgeon: Matthew Martin, MD;  Location: [facility];  Service: General;  Laterality: Left;  With MESH  . TONSILLECTOMY    . VIDEO BRONCHOSCOPY WITH ENDOBRONCHIAL ULTRASOUND N/A 04/18/2015   Procedure: VIDEO BRONCHOSCOPY WITH ENDOBRONCHIAL ULTRASOUND;  Surgeon: Edward B Gerhardt, MD;  Location: [facility] OR;  Service: Thoracic;  Laterality: N/A;  . VIDEO BRONCHOSCOPY WITH INSERTION OF INTERBRONCHIAL VALVE (IBV) N/A 08/02/2015   Procedure: VIDEO BRONCHOSCOPY WITH ATTEMPTED INSERTION OF INTERBRONCHIAL VALVE (IBV) WITH FLUROSCOPY;  Surgeon: Edward B Gerhardt, MD;  Location: [facility] OR;  Service: Thoracic;  Laterality: N/A;  . WISDOM TOOTH EXTRACTION      REVIEW OF SYSTEMS:  Constitutional: positive for fatigue Eyes: negative Ears, nose, mouth, throat, and face: negative Respiratory: positive for cough, dyspnea on exertion and wheezing Cardiovascular: negative Gastrointestinal: negative Genitourinary:negative Integument/breast: negative Hematologic/lymphatic: negative Musculoskeletal:negative Neurological: negative Behavioral/Psych: negative Endocrine: negative Allergic/Immunologic: negative   PHYSICAL EXAMINATION: General appearance: alert, cooperative, fatigued and no  distress Head: Normocephalic, without obvious abnormality, atraumatic Neck: no adenopathy, no JVD, supple, symmetrical, trachea midline and thyroid not enlarged, symmetric, no tenderness/mass/nodules Lymph nodes: Cervical, supraclavicular, and axillary nodes normal. Resp: rhonchi bilaterally and wheezes bilaterally Back: symmetric, no curvature. ROM normal. No CVA tenderness. Cardio: regular rate and rhythm, S1, S2 normal, no murmur, click, rub or gallop GI: soft, non-tender; bowel sounds normal; no masses,  no organomegaly Extremities: extremities normal, atraumatic, no cyanosis or edema Neurologic: Alert and oriented X 3, normal strength and tone. Normal symmetric reflexes. Normal coordination and gait  ECOG PERFORMANCE STATUS: 1 - Symptomatic but completely ambulatory  Blood pressure (!) 159/87, pulse 91, temperature 98 F (36.7 C), temperature source Oral, resp. rate 18, weight 117 lb 1.6 oz (53.1 kg), SpO2 95 %.  LABORATORY DATA: Lab Results  Component Value Date   WBC 11.7 (H) 11/26/2016   HGB 14.0 11/26/2016   HCT 42.2 11/26/2016   MCV 87.3 11/26/2016     PLT 358 11/26/2016      Chemistry      Component Value Date/Time   NA 138 11/12/2016 1344   K 4.6 11/12/2016 1344   CL 102 09/12/2015 0410   CO2 25 11/12/2016 1344   BUN 34.7 (H) 11/12/2016 1344   CREATININE 1.3 11/12/2016 1344      Component Value Date/Time   CALCIUM 9.7 11/12/2016 1344   ALKPHOS 108 11/12/2016 1344   AST 26 11/12/2016 1344   ALT 25 11/12/2016 1344   BILITOT 0.38 11/12/2016 1344       RADIOGRAPHIC STUDIES: Dg Chest 2 View  Result Date: 11/26/2016 CLINICAL DATA:  Shortness of breath an wheezing over the last 3 weeks. History of chronic lung disease and lung cancer. EXAM: CHEST  2 VIEW COMPARISON:  CT 11/20/2016. Multiple prior examinations 2017 and 2016. FINDINGS: Chronic volume loss and consolidation in the right upper lobe as seen previously. There certainly could be ongoing inflammatory  change in this region. Right lower lung shows emphysematous change and scarring but is largely clear. Left lung shows emphysema and scarring but is largely clear. Old healed rib fractures and old partial compression fractures as seen previously. IMPRESSION: No acute process identifiable by radiography. Extensive chronic scarring and consolidation in the right upper lobe. Certainly, there could be on going inflammation in that region. Elsewhere, the lungs show emphysema and chronic scarring. Electronically Signed   By: Mark  Shogry M.D.   On: 11/26/2016 10:47   Ct Angio Chest Pe W Or Wo Contrast  Result Date: 11/20/2016 CLINICAL DATA:  One week history of shortness of breath. History of lung cancer. EXAM: CT ANGIOGRAPHY CHEST WITH CONTRAST TECHNIQUE: Multidetector CT imaging of the chest was performed using the standard protocol during bolus administration of intravenous contrast. Multiplanar CT image reconstructions and MIPs were obtained to evaluate the vascular anatomy. CONTRAST:  80 cc Isovue 370 COMPARISON:  10/26/2016 FINDINGS: Chest wall: No chest wall mass or axillary adenopathy. Stable supraclavicular adenopathy. The largest node measures 11 mm on image number 24. Cardiovascular: The heart is normal in size. No pericardial effusion. The aorta is normal in caliber. Stable tortuosity and atherosclerotic calcifications. No dissection. The branch vessels are patent. Scattered coronary artery calcifications. The pulmonary arterial tree is well opacified. No filling defects to suggest pulmonary embolism. Mediastinum/Nodes: No significant upper abdominal findings. Stable advanced atherosclerotic calcifications involving the abdominal aorta. No obvious hepatic or adrenal gland metastasis. No significant bony findings. Stable mid thoracic compression deformity and stable sclerotic change involving the right upper ribs, likely from radiation. 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 No obvious recurrent tumor in this area. Stable severe emphysematous changes and pulmonary scarring. Numerous right-sided pulmonary nodules are stable and consistent with metastatic disease. Stable left lung nodules are also noted. The left lower lobe process has improved since the prior CT. This is most likely a resolving infection or inflammation. Upper Abdomen: No significant upper abdominal findings. No obvious hepatic or adrenal gland metastasis. Musculoskeletal: No significant bony findings. Stable mid thoracic compression deformity and sclerotic change involving the upper right anterior ribs. Review of the MIP images confirms the above findings. IMPRESSION: 1. No CT findings for pulmonary embolism. 2. Stable radiation changes involving the right lung and stable pulmonary metastatic disease. 3. Interval partial clearing of the left lower lobe inflammatory or infectious process. 4. Stable mediastinal/right hilar lymph nodes and supraclavicular lymph nodes. 5. No findings for upper   abdominal metastatic disease. Electronically Signed   By: P.  Gallerani M.D.   On: 11/20/2016 11:58    ASSESSMENT AND PLAN: This is a very pleasant 75 years old white male with: 1) metastatic non-small cell lung cancer, adenocarcinoma initially diagnosed as locally advanced disease with a stage IIIA. The patient underwent a course of concurrent chemoradiation with weekly carboplatin and paclitaxel. He has initial partial response followed by progression of his disease. He is currently on treatment with immunotherapy with Nivolumab status post 22 cycles. He has been tolerating his treatment well but the patient continues to have significant shortness of breath of unclear etiology. His recent CT angiogram of the chest showed improvement of the inflammatory process in the left lower lobe. I personally and independently reviewed the scan images and discuss the results and  showed the images to the patient and his daughter today. He has no symptomatic improvement in his condition. I discussed with the patient holding his treatment with immunotherapy for the next 4 weeks. 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 I will consider the patient for treatment with a tapered dose of prednisone starting at 60 mg by mouth daily for 1 week followed by 40 mg by mouth daily for 1 week followed by 20 mg by mouth daily for 1 week followed by 10 mg by mouth daily for 1 week until I see the patient in 4 weeks. I'll send the prescription to his pharmacy today. He will also continue his treatment with Symbicort and albuterol inhaler. 3) hypertension: The patient was very anxious today about his scan results. He will continue to monitor his blood pressure closely at home. 4) anemia: He will continue on ferrous sulfate. 5) smoking cessation: I strongly encouraged the patient to quit smoking and he is working on quitting smoking. The patient voices understanding of current disease status and treatment options and is in agreement with the current care plan.  All questions were answered. The patient knows to call the clinic with any problems, questions or concerns. We can certainly see the patient much sooner if necessary.  Disclaimer: This note was dictated with voice recognition software. Similar sounding words can inadvertently be transcribed and may not be corrected upon review.       

## 2016-11-26 NOTE — Assessment & Plan Note (Signed)
Continue working on quitting smoking.

## 2016-11-26 NOTE — Assessment & Plan Note (Signed)
Continue to monitor your weight Add Boost or Ensure meal supplements as able.

## 2016-11-26 NOTE — Assessment & Plan Note (Addendum)
COPD Flare post bronchitis: Plan CXR today stat. We will call you with the result. Prednisone taper 30 mg x 2 days, 20 mg x 2 days, 10 mg x 2 days then stop. Please check with Dr. Earlie Server prior to starting. Use your rescue inhaler as needed for shortness of breath up to every 6 hours . If inhaler does not relieve symptoms, you may escalate to nebulizer treatments. Continue using your Symbicort daily as you have been doing every day. Rinse mouth after use. Continue using your Mucinex daily as you have been doing. Follow up in 2 weeks with either Dr. Chase Caller or Eric Form, NP. Please contact office for sooner follow up if symptoms do not improve or worsen or seek emergency care   If continued lack of improvement, consider repeat PFT's and addition of second agent as maintenance.

## 2016-11-30 ENCOUNTER — Telehealth: Payer: Self-pay | Admitting: Acute Care

## 2016-11-30 NOTE — Telephone Encounter (Signed)
SG  Please Advise-  Pt. Wanted to know his chest xray results

## 2016-12-03 NOTE — Telephone Encounter (Signed)
Called and spoke with pt and he is aware of SG recs. Nothing further is needed.

## 2016-12-03 NOTE — Telephone Encounter (Signed)
SG please advise on cxr results.  Thanks!

## 2016-12-03 NOTE — Telephone Encounter (Signed)
Please call patient and let him know his CXR showed   No acute process identifiable by radiography. Extensive chronic scarring and consolidation in the right upper lobe. Certainly, there could be on going inflammation in that region. Elsewhere, the lungs show emphysema and chronic scarring.  He was prescribed prednisone taper, which he was going to speak with Dr. Earlie Server about taking for the inflammation. I see he is on a tapered dose over the next 4 weeks. Have him follow the plan of care as  Discussed and follow up as scheduled. Thanks so much.

## 2016-12-10 ENCOUNTER — Ambulatory Visit: Payer: 59

## 2016-12-10 ENCOUNTER — Other Ambulatory Visit: Payer: 59

## 2016-12-10 ENCOUNTER — Ambulatory Visit (INDEPENDENT_AMBULATORY_CARE_PROVIDER_SITE_OTHER): Payer: 59 | Admitting: Acute Care

## 2016-12-10 ENCOUNTER — Encounter: Payer: Self-pay | Admitting: Acute Care

## 2016-12-10 ENCOUNTER — Ambulatory Visit: Payer: 59 | Admitting: Internal Medicine

## 2016-12-10 DIAGNOSIS — F17210 Nicotine dependence, cigarettes, uncomplicated: Secondary | ICD-10-CM | POA: Diagnosis not present

## 2016-12-10 DIAGNOSIS — E440 Moderate protein-calorie malnutrition: Secondary | ICD-10-CM

## 2016-12-10 DIAGNOSIS — J441 Chronic obstructive pulmonary disease with (acute) exacerbation: Secondary | ICD-10-CM | POA: Diagnosis not present

## 2016-12-10 NOTE — Progress Notes (Signed)
History of Present Illness Gabriel [name] is a 76 y.o. 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 He is followed by Dr. Chase Caller.   12/10/2016 Follow up for Dyspnea Pt presents today feeling less short of breath. He states he is compliant with his prednisone daily as prescribed. He will finish in 2 weeks and then has a follow up appointment with Dr. Earlie Server to determine next steps for Immunotherapy.His fatigue has improved , his wheezing has cleared. He is now able to ambulate much further distances without dyspnea. He states his secretions are white. He is continuing to use Mucinex 600 mg once daily. He feels this is helping.He is compliant with his Symbicort twice daily.He states he does still have a degree of fever. His baseline temp is 97.8 and he states he has been running 98.8 for the last month. He does not have any other symptoms. He is greatly improve and feels he is continuing to improve.He denies chest pain, orthopnea or hemoptysis.  Tests DIAGNOSIS:Progressive non-small cell lung cancer, adenocarcinoma initially diagnosed as a stage IIIA(T2a, N2, M0) in June 2016.  Foundation One molecular studies reveal that he was positive for [place], Pyote, and STAG2 S1033f*20. He was negative for: EGFR, ALK, BRAF, MET, RET, ERBB2 and ROS1  PRIOR THERAPY: 1) Concurrent chemoradiation with chemotherapy in the form of weekly carboplatin for AUC 2 paclitaxel 45 mg/m given concurrent with radiation therapy. First cycle started 05/09/2015. Status post 7 cycles. 2) Consolidation chemotherapy with carboplatin for AUC of 5 on day 1 and gemcitabine 1000 MG/M2 on days 1 and 8 every 3 weeks. First dose 08/15/2015. Discontinued secondary to intolerance.  CURRENT THERAPY:Immunotherapy with Nivolumab 240 MG IV every 2 weeks, status post 18 cycles.  First dose was given 01/18/2016.  Tests 11/02/2016 CXR:  IMPRESSION: Persistent cavitary lesion within the right hemi thorax, showing little interval change.  CT Angio 11/20/2016 IMPRESSION: 1. No CT findings for pulmonary embolism. 2. Stable radiation changes involving the right lung and stable pulmonary metastatic disease. 3. Interval partial clearing of the left lower lobe inflammatory or infectious process. 4. Stable mediastinal/right hilar lymph nodes and supraclavicular lymph nodes. 5. No findings for upper abdominal metastatic disease.  Past medical hx Past Medical History:  Diagnosis Date  . Cancer ([facility]    skin cancer  . Constipation    AFTER ANESTHESIA  . Difficulty sleeping   . History of nonmelanoma skin cancer   . Hypertension 10/30/2016  . Inguinal hernia   . Lung mass    right / HAS HAD RADIATION AND CHEMO FOR LUNG CANCER  . Pneumothorax on right 08/24/2015  . Productive cough    "DUE TO RADIATION"  . Radiation 05/11/15-06/21/15   NSCLCA/right lung  . Smoking 1/2 pack a day or less 09/17/2016  . Spontaneous pneumothorax MANY YRS AGO AND AGAIN 04/04/15   HISTORY OF (ONLY)     Past surgical hx, Family hx, Social hx all reviewed.  Current Outpatient Prescriptions on File Prior to Visit  Medication Sig  . acetaminophen (TYLENOL) 325 MG tablet 2 tablets  . albuterol (PROVENTIL HFA) 108 (90 Base) MCG/ACT inhaler 1-2 puffs as needed  . Arginine (RA L-ARGININE) 1000 MG TABS Take 500 mg by mouth daily.   . Ascorbic Acid (VITAMIN C) 1000 MG tablet Take 1,000 mg by mouth daily. Pt takes 2 5043mtabs  . B  Complex Vitamins (B COMPLEX PO) Take 1 capsule by mouth daily.   . budesonide-formoterol (SYMBICORT) 160-4.5 MCG/ACT inhaler 2 puffs BID  . CALCIUM CITRATE PO Take 1 tablet by mouth daily.  . Cyanocobalamin (VITAMIN B 12 PO) Take 1 tablet by mouth daily.  . ferrous sulfate 325 (65 FE) MG tablet Take 325 mg by mouth daily with breakfast.  . guaiFENesin (MUCINEX)  600 MG 12 hr tablet Take 1 tablet (600 mg total) by mouth 2 (two) times daily as needed.  . magic mouthwash SOLN Take 5 mLs by mouth 4 (four) times daily as needed.  . magnesium 30 MG tablet Take 30 mg by mouth daily.  . Multiple Vitamin (MULTI VITAMIN DAILY PO) Take 1 tablet by mouth daily.  . Omega-3 Fatty Acids (FISH OIL) 1000 MG CAPS Take 1,000 mg by mouth daily.  . predniSONE (DELTASONE) 20 MG tablet 3 tablets by mouth daily for 1 week, 2 tablets by mouth daily for 1 week, 1 tablet by mouth daily for 1 week and then 0.5 tablet by mouth daily for 1 week until you see Dr. Julien Nordmann.  . saccharomyces boulardii (FLORASTOR) 250 MG capsule Take 250 mg by mouth 2 (two) times daily.  . vitamin A 10000 UNIT capsule 1 capsule with food or milk   No current facility-administered medications on file prior to visit.      Allergies  Allergen Reactions  . Aleve [Naproxen] Rash    Review Of Systems:  Constitutional:   No  weight loss, night sweats,  Fevers, chills, fatigue, or  lassitude.  HEENT:   No headaches,  Difficulty swallowing,  Tooth/dental problems, or  Sore throat,                No sneezing, itching, ear ache, nasal congestion, post nasal drip,   CV:  No chest pain,  Orthopnea, PND, swelling in lower extremities, anasarca, dizziness, palpitations, syncope.   GI  No heartburn, indigestion, abdominal pain, nausea, vomiting, diarrhea, change in bowel habits, loss of appetite, bloody stools.   Resp: No shortness of breath with exertion or at rest.  No excess mucus, no productive cough,  No non-productive cough,  No coughing up of blood.  No change in color of mucus.  No wheezing.  No chest wall deformity  Skin: no rash or lesions.  GU: no dysuria, change in color of urine, no urgency or frequency.  No flank pain, no hematuria   MS:  No joint pain or swelling.  No decreased range of motion.  No back pain.  Psych:  No change in mood or affect. No depression or anxiety.  No memory  loss.   Vital Signs BP 138/76 (BP Location: Left Arm, Cuff Size: Normal)   Pulse 91   Ht 5' 10"  (1.778 m)   Wt 120 lb (54.4 kg)   SpO2 99%   BMI 17.22 kg/m    Physical Exam:  General- No distress,  A&Ox3, pleasant ENT: No sinus tenderness, TM clear, pale nasal mucosa, no oral exudate,no post nasal drip, no LAN Cardiac: S1, S2, regular rate and rhythm, no murmur Chest: No wheeze/ rales/ dullness; no accessory muscle use, no nasal flaring, no sternal retractions Abd.: Soft Non-tender, flat Ext: No clubbing cyanosis, edema Neuro:  normal strength, which has improved since last visit. Skin: No rashes, warm and dry Psych: normal mood and behavior   Assessment/Plan  COPD exacerbation (HCC) Resolving Much improved with prednsone Plan: Continue Symbicort 2 puffs twice daily Finish prednisone as  prescribed Continue Mucinex as you have been doing. Follow up with Dr. Earlie Server as is scheduled 12/24/2016 Continue to work on weight gain with meal supplements Try to quit smoking. Follow up with Dr. Chase Caller at first available in 2 months. Please contact office for sooner follow up if symptoms do not improve or worsen or seek emergency care    Protein-calorie malnutrition ([place]) Monitor weight Continue Boost or Ensure as meal supplement  Smoking 1/2 pack a day or less Counseled on smoking cessation    Magdalen Spatz, NP 12/10/2016  10:10 AM

## 2016-12-10 NOTE — Assessment & Plan Note (Signed)
Resolving Much improved with prednsone Plan: Continue Symbicort 2 puffs twice daily Finish prednisone as prescribed Continue Mucinex as you have been doing. Follow up with Dr. Earlie Server as is scheduled 12/24/2016 Continue to work on weight gain with meal supplements Try to quit smoking. Follow up with Dr. Chase Caller at first available in 2 months. Please contact office for sooner follow up if symptoms do not improve or worsen or seek emergency care

## 2016-12-10 NOTE — Assessment & Plan Note (Signed)
Monitor weight Continue Boost or Ensure as meal supplement

## 2016-12-10 NOTE — Patient Instructions (Addendum)
It is great to see you today. I am so glad you are feeling better. Continue Symbicort 2 puffs twice daily Finish prednisone as prescribed Continue Mucinex as you have been doing. Follow up with Dr. Earlie Server as is scheduled 12/24/2016 Follow up with Dr. Chase Caller at first available in 2 months. Please contact office for sooner follow up if symptoms do not improve or worsen or seek emergency care

## 2016-12-10 NOTE — Assessment & Plan Note (Signed)
Counseled on smoking cessation  

## 2016-12-24 ENCOUNTER — Ambulatory Visit (HOSPITAL_BASED_OUTPATIENT_CLINIC_OR_DEPARTMENT_OTHER): Payer: 59 | Admitting: Internal Medicine

## 2016-12-24 ENCOUNTER — Ambulatory Visit (HOSPITAL_BASED_OUTPATIENT_CLINIC_OR_DEPARTMENT_OTHER): Payer: 59

## 2016-12-24 ENCOUNTER — Telehealth: Payer: Self-pay | Admitting: Internal Medicine

## 2016-12-24 ENCOUNTER — Other Ambulatory Visit: Payer: Self-pay | Admitting: Medical Oncology

## 2016-12-24 ENCOUNTER — Encounter: Payer: Self-pay | Admitting: Internal Medicine

## 2016-12-24 ENCOUNTER — Other Ambulatory Visit (HOSPITAL_BASED_OUTPATIENT_CLINIC_OR_DEPARTMENT_OTHER): Payer: 59

## 2016-12-24 VITALS — BP 170/90 | HR 93 | Temp 97.8°F | Resp 18 | Ht 70.0 in | Wt 120.7 lb

## 2016-12-24 DIAGNOSIS — C3491 Malignant neoplasm of unspecified part of right bronchus or lung: Secondary | ICD-10-CM | POA: Diagnosis not present

## 2016-12-24 DIAGNOSIS — Z5112 Encounter for antineoplastic immunotherapy: Secondary | ICD-10-CM

## 2016-12-24 DIAGNOSIS — I10 Essential (primary) hypertension: Secondary | ICD-10-CM

## 2016-12-24 DIAGNOSIS — F17210 Nicotine dependence, cigarettes, uncomplicated: Secondary | ICD-10-CM

## 2016-12-24 DIAGNOSIS — J441 Chronic obstructive pulmonary disease with (acute) exacerbation: Secondary | ICD-10-CM

## 2016-12-24 DIAGNOSIS — R05 Cough: Secondary | ICD-10-CM

## 2016-12-24 LAB — COMPREHENSIVE METABOLIC PANEL
ALT: 22 U/L (ref 0–55)
AST: 19 U/L (ref 5–34)
Albumin: 3.9 g/dL (ref 3.5–5.0)
Alkaline Phosphatase: 89 U/L (ref 40–150)
Anion Gap: 9 mEq/L (ref 3–11)
BUN: 26.8 mg/dL — AB (ref 7.0–26.0)
CHLORIDE: 104 meq/L (ref 98–109)
CO2: 27 meq/L (ref 22–29)
Calcium: 10.1 mg/dL (ref 8.4–10.4)
Creatinine: 1 mg/dL (ref 0.7–1.3)
EGFR: 77 mL/min/{1.73_m2} — ABNORMAL LOW (ref >90)
GLUCOSE: 80 mg/dL (ref 70–140)
POTASSIUM: 4.1 meq/L (ref 3.5–5.1)
SODIUM: 139 meq/L (ref 136–145)
Total Bilirubin: 0.31 mg/dL (ref 0.20–1.20)
Total Protein: 7.3 g/dL (ref 6.4–8.3)

## 2016-12-24 LAB — CBC WITH DIFFERENTIAL/PLATELET
BASO%: 0.1 % (ref 0.0–2.0)
Basophils Absolute: 0 10*3/uL (ref 0.0–0.1)
EOS%: 0.3 % (ref 0.0–7.0)
Eosinophils Absolute: 0 10*3/uL (ref 0.0–0.5)
HCT: 42.8 % (ref 38.4–49.9)
HEMOGLOBIN: 14.1 g/dL (ref 13.0–17.1)
LYMPH%: 4.6 % — AB (ref 14.0–49.0)
MCH: 29.3 pg (ref 27.2–33.4)
MCHC: 32.9 g/dL (ref 32.0–36.0)
MCV: 88.8 fL (ref 79.3–98.0)
MONO#: 0.4 10*3/uL (ref 0.1–0.9)
MONO%: 3.2 % (ref 0.0–14.0)
NEUT%: 91.8 % — ABNORMAL HIGH (ref 39.0–75.0)
NEUTROS ABS: 10.7 10*3/uL — AB (ref 1.5–6.5)
Platelets: 194 10*3/uL (ref 140–400)
RBC: 4.82 10*6/uL (ref 4.20–5.82)
RDW: 17.2 % — ABNORMAL HIGH (ref 11.0–14.6)
WBC: 11.7 10*3/uL — AB (ref 4.0–10.3)
lymph#: 0.5 10*3/uL — ABNORMAL LOW (ref 0.9–3.3)

## 2016-12-24 MED ORDER — SODIUM CHLORIDE 0.9 % IV SOLN
Freq: Once | INTRAVENOUS | Status: AC
  Administered 2016-12-24: 15:00:00 via INTRAVENOUS

## 2016-12-24 MED ORDER — SODIUM CHLORIDE 0.9 % IV SOLN
240.0000 mg | Freq: Once | INTRAVENOUS | Status: AC
  Administered 2016-12-24: 240 mg via INTRAVENOUS
  Filled 2016-12-24: qty 20

## 2016-12-24 NOTE — Patient Instructions (Signed)
Steps to Quit Smoking Smoking tobacco can be bad for your health. It can also affect almost every organ in your body. Smoking puts you and people around you at risk for many serious long-lasting (chronic) diseases. Quitting smoking is hard, but it is one of the best things that you can do for your health. It is never too late to quit. What are the benefits of quitting smoking? When you quit smoking, you lower your risk for getting serious diseases and conditions. They can include:  Lung cancer or lung disease.  Heart disease.  Stroke.  Heart attack.  Not being able to have children (infertility).  Weak bones (osteoporosis) and broken bones (fractures). If you have coughing, wheezing, and shortness of breath, those symptoms may get better when you quit. You may also get sick less often. If you are pregnant, quitting smoking can help to lower your chances of having a baby of low birth weight. What can I do to help me quit smoking? Talk with your doctor about what can help you quit smoking. Some things you can do (strategies) include:  Quitting smoking totally, instead of slowly cutting back how much you smoke over a period of time.  Going to in-person counseling. You are more likely to quit if you go to many counseling sessions.  Using resources and support systems, such as:  Online chats with a counselor.  Phone quitlines.  Printed self-help materials.  Support groups or group counseling.  Text messaging programs.  Mobile phone apps or applications.  Taking medicines. Some of these medicines may have nicotine in them. If you are pregnant or breastfeeding, do not take any medicines to quit smoking unless your doctor says it is okay. Talk with your doctor about counseling or other things that can help you. Talk with your doctor about using more than one strategy at the same time, such as taking medicines while you are also going to in-person counseling. This can help make quitting  easier. What things can I do to make it easier to quit? Quitting smoking might feel very hard at first, but there is a lot that you can do to make it easier. Take these steps:  Talk to your family and friends. Ask them to support and encourage you.  Call phone quitlines, reach out to support groups, or work with a counselor.  Ask people who smoke to not smoke around you.  Avoid places that make you want (trigger) to smoke, such as:  Bars.  Parties.  Smoke-break areas at work.  Spend time with people who do not smoke.  Lower the stress in your life. Stress can make you want to smoke. Try these things to help your stress:  Getting regular exercise.  Deep-breathing exercises.  Yoga.  Meditating.  Doing a body scan. To do this, close your eyes, focus on one area of your body at a time from head to toe, and notice which parts of your body are tense. Try to relax the muscles in those areas.  Download or buy apps on your mobile phone or tablet that can help you stick to your quit plan. There are many free apps, such as QuitGuide from the CDC (Centers for Disease Control and Prevention). You can find more support from smokefree.gov and other websites. This information is not intended to replace advice given to you by your health care provider. Make sure you discuss any questions you have with your health care provider. Document Released: 08/11/2009 Document Revised: 06/12/2016 Document   Reviewed: 03/01/2015 Elsevier Interactive Patient Education  2017 Elsevier Inc.  

## 2016-12-24 NOTE — Progress Notes (Signed)
[facility] [phone]   [phone]  OFFICE PROGRESS NOTE  Cammy Copa, MD [phone] [address], [address] [place] [address]  DIAGNOSIS: Progressive non-small cell lung cancer, adenocarcinoma initially diagnosed as a stage IIIa (T2a, N2, M0) in July 2016.  [facility] One molecular studies reveal that he was positive for [place], [place], and STAG2 [id number]*20. He was negative for: EGFR, ALK, BRAF, MET, RET, ERBB2 and ROS1  PRIOR THERAPY: 1) Concurrent chemoradiation with chemotherapy in the form of weekly carboplatin for AUC 2 paclitaxel 45 mg/m given concurrent with radiation therapy. First cycle started 05/09/2015. Status post 7 cycles. 2) Consolidation chemotherapy with carboplatin for AUC of 5 on day 1 and gemcitabine 1000 MG/M2 on days 1 and 8 every 3 weeks. First dose 08/15/2015. Discontinued secondary to intolerance.  CURRENT THERAPY: Immunotherapy with Nivolumab 240 mg IV every 2 weeks, status post 22 cycles.  INTERVAL HISTORY: Gabriel [id number]. male came to the clinic today accompanied by [name] The patient is feeling much better today. His shortness of breath had significantly improved after starting the taper dose of prednisone. He continues to have mild cough but much better than before. He gained few pounds since his last visit. He denied having any current chest pain, shortness breath but continues to have dry cough with no hemoptysis. He has no fever or chills. He denied having any nausea, vomiting, diarrhea or constipation. Today is the last day of his prednisone 10 mg. He is here today for evaluation before resuming his treatment with Nivolumab.  MEDICAL HISTORY: Past Medical History:  Diagnosis Date  . [place]    skin cancer  . Constipation    AFTER ANESTHESIA  . Difficulty sleeping   . History of nonmelanoma skin cancer   . Hypertension 10/30/2016  . Inguinal hernia   . Lung mass     right / HAS HAD RADIATION AND CHEMO FOR LUNG CANCER  . Pneumothorax on right 08/24/2015  . Productive cough    "DUE TO RADIATION"  . Radiation 05/11/15-06/21/15   NSCLCA/right lung  . Smoking 1/2 pack a day or less 09/17/2016  . Spontaneous pneumothorax MANY YRS AGO AND AGAIN 04/04/15   HISTORY OF (ONLY)    ALLERGIES:  is allergic to aleve [naproxen].  MEDICATIONS:  Current Outpatient Prescriptions  Medication Sig Dispense Refill  . acetaminophen (TYLENOL) 325 MG tablet 2 tablets    . albuterol (PROVENTIL HFA) 108 (90 Base) MCG/ACT inhaler 1-2 puffs as needed    . Arginine (RA L-ARGININE) 1000 MG TABS Take 500 mg by mouth daily.     . Ascorbic Acid (VITAMIN C) 1000 MG tablet Take 1,000 mg by mouth daily. Pt takes 2 5037mtabs    . B Complex Vitamins (B COMPLEX PO) Take 1 capsule by mouth daily.     . budesonide-formoterol (SYMBICORT) 160-4.5 MCG/ACT inhaler 2 puffs BID 1 Inhaler 5  . CALCIUM CITRATE PO Take 1 tablet by mouth daily.    . Cyanocobalamin (VITAMIN B 12 PO) Take 1 tablet by mouth daily.    . ferrous sulfate 325 (65 FE) MG tablet Take 325 mg by mouth daily with breakfast.    . guaiFENesin (MUCINEX) 600 MG 12 hr tablet Take 1 tablet (600 mg total) by mouth 2 (two) times daily as needed.    . magic mouthwash SOLN Take 5 mLs by mouth 4 (four) times daily as needed.    . magnesium  30 MG tablet Take 30 mg by mouth daily.    . Multiple Vitamin (MULTI VITAMIN DAILY PO) Take 1 tablet by mouth daily.    . Omega-3 Fatty Acids (FISH OIL) 1000 MG CAPS Take 1,000 mg by mouth daily.    . predniSONE (DELTASONE) 20 MG tablet 3 tablets by mouth daily for 1 week, 2 tablets by mouth daily for 1 week, 1 tablet by mouth daily for 1 week and then 0.5 tablet by mouth daily for 1 week until you see Dr. Julien Nordmann. 50 tablet 0  . saccharomyces boulardii (FLORASTOR) 250 MG capsule Take 250 mg by mouth 2 (two) times daily.    . vitamin A 10000 UNIT capsule 1 capsule with food or milk     No current  facility-administered medications for this visit.     SURGICAL HISTORY:  Past Surgical History:  Procedure Laterality Date  . CHEST TUBE INSERTION Right 08/02/2015   Procedure: INSERTION OF SECOND RIGHT CHEST TUBE WITH FLUROSCOPY;  Surgeon: Grace Isaac, MD;  Location: [facility];  Service: Thoracic;  Laterality: Right;  . CHEST TUBE INSERTION  07/28/2015  . COLONOSCOPY W/ BIOPSIES AND POLYPECTOMY    . HERNIA REPAIR  2011  . INGUINAL HERNIA REPAIR Left 07/15/2015   Procedure: OPEN LEFT INGUINAL HERNIA REPAIR;  Surgeon: Johnathan Hausen, MD;  Location: [facility];  Service: General;  Laterality: Left;  With MESH  . TONSILLECTOMY    . VIDEO BRONCHOSCOPY WITH ENDOBRONCHIAL ULTRASOUND N/A 04/18/2015   Procedure: VIDEO BRONCHOSCOPY WITH ENDOBRONCHIAL ULTRASOUND;  Surgeon: Grace Isaac, MD;  Location: [facility];  Service: Thoracic;  Laterality: N/A;  . VIDEO BRONCHOSCOPY WITH INSERTION OF INTERBRONCHIAL VALVE (IBV) N/A 08/02/2015   Procedure: VIDEO BRONCHOSCOPY WITH ATTEMPTED INSERTION OF INTERBRONCHIAL VALVE (IBV) WITH FLUROSCOPY;  Surgeon: Grace Isaac, MD;  Location: [facility];  Service: Thoracic;  Laterality: N/A;  . WISDOM TOOTH EXTRACTION      REVIEW OF SYSTEMS:  Constitutional: negative Eyes: negative Ears, nose, mouth, throat, and face: negative Respiratory: positive for cough and wheezing Cardiovascular: negative Gastrointestinal: negative Genitourinary:negative Integument/breast: negative Hematologic/lymphatic: negative Musculoskeletal:negative Neurological: negative Behavioral/Psych: negative Endocrine: negative Allergic/Immunologic: negative   PHYSICAL EXAMINATION: General appearance: alert, cooperative and no distress Head: Normocephalic, without obvious abnormality, atraumatic Neck: no adenopathy, no JVD, supple, symmetrical, trachea midline and thyroid not enlarged, symmetric, no tenderness/mass/nodules Lymph nodes: Cervical, supraclavicular, and axillary nodes normal. Resp:  rhonchi bilaterally and wheezes bilaterally Back: symmetric, no curvature. ROM normal. No CVA tenderness. Cardio: regular rate and rhythm, S1, S2 normal, no murmur, click, rub or gallop GI: soft, non-tender; bowel sounds normal; no masses,  no organomegaly Extremities: extremities normal, atraumatic, no cyanosis or edema Neurologic: Alert and oriented X 3, normal strength and tone. Normal symmetric reflexes. Normal coordination and gait  ECOG PERFORMANCE STATUS: 1 - Symptomatic but completely ambulatory  Blood pressure (!) 170/90, pulse 93, temperature 97.8 F (36.6 C), temperature source Oral, resp. rate 18, height 5' 10"  (1.778 m), weight 120 lb 11.2 oz (54.7 kg), SpO2 99 %.  LABORATORY DATA: Lab Results  Component Value Date   WBC 11.7 (H) 12/24/2016   HGB 14.1 12/24/2016   HCT 42.8 12/24/2016   MCV 88.8 12/24/2016   PLT 194 12/24/2016      Chemistry      Component Value Date/Time   NA 138 11/26/2016 1207   K 4.3 11/26/2016 1207   CL 102 09/12/2015 0410   CO2 23 11/26/2016 1207   BUN 34.5 (H) 11/26/2016 1207  CREATININE 1.2 11/26/2016 1207      Component Value Date/Time   CALCIUM 9.9 11/26/2016 1207   ALKPHOS 120 11/26/2016 1207   AST 25 11/26/2016 1207   ALT 25 11/26/2016 1207   BILITOT 0.47 11/26/2016 1207       RADIOGRAPHIC STUDIES: Dg Chest 2 View  Result Date: 11/26/2016 CLINICAL DATA:  Shortness of breath an wheezing over the last 3 weeks. History of chronic lung disease and lung cancer. EXAM: CHEST  2 VIEW COMPARISON:  CT 11/20/2016. Multiple prior examinations 2017 and 2016. FINDINGS: Chronic volume loss and consolidation in the right upper lobe as seen previously. There certainly could be ongoing inflammatory change in this region. Right lower lung shows emphysematous change and scarring but is largely clear. Left lung shows emphysema and scarring but is largely clear. Old healed rib fractures and old partial compression fractures as seen previously.  IMPRESSION: No acute process identifiable by radiography. Extensive chronic scarring and consolidation in the right upper lobe. Certainly, there could be on going inflammation in that region. Elsewhere, the lungs show emphysema and chronic scarring. Electronically Signed   By: Nelson Chimes M.D.   On: 11/26/2016 10:47    ASSESSMENT AND PLAN: This is a very pleasant 76 years old white male with metastatic non-small cell lung cancer, adenocarcinoma initially diagnosed as locally advanced disease status post concurrent chemoradiation with weekly carboplatin and paclitaxel with partial response followed by disease progression. The patient was started on treatment with immunotherapy with Nivolumab status post 22 cycles. His smoking history treatment well except for shortness breath and persistent dry cough of unclear etiology. There was a concern about possibly of immunotherapy induced pneumonitis and his treatment was on hold for the last few weeks. The patient was treated with a tapered dose of prednisone over the last 4 weeks. He is currently on the last day of 10 mg by mouth daily. I recommended for him to take 10 mg every other day for next week. I recommended for the patient to resume his treatment with immunotherapy with Nivolumab. He will proceed with cycle #23 today. For hypertension, I strongly advised the patient to take his blood pressure medication as prescribed and to monitor his blood pressure closely at home. He mentioned that his blood pressure is usually within the normal range at home. I will see him back for follow-up visit in 2 weeks for evaluation before starting cycle #24 of his treatment. The patient was advised to call immediately if he has any concerning symptoms in the interval. The patient voices understanding of current disease status and treatment options and is in agreement with the current care plan.  All questions were answered. The patient knows to call the clinic with any  problems, questions or concerns. We can certainly see the patient much sooner if necessary.  Disclaimer: This note was dictated with voice recognition software. Similar sounding words can inadvertently be transcribed and may not be corrected upon review.

## 2016-12-24 NOTE — Telephone Encounter (Signed)
Appointments scheduled per 2/26 LOS. Patient given AVS report and calendars with future scheduled appointments. °

## 2016-12-24 NOTE — Patient Instructions (Signed)
[facility] Discharge Instructions for Patients Receiving Chemotherapy  Today you received the following chemotherapy agents:  Nivolumab (Opdivo)  To help prevent nausea and vomiting after your treatment, we encourage you to take your nausea medication as prescribed.   If you develop nausea and vomiting that is not controlled by your nausea medication, call the clinic.   BELOW ARE SYMPTOMS THAT SHOULD BE REPORTED IMMEDIATELY:  *FEVER GREATER THAN 100.5 F  *CHILLS WITH OR WITHOUT FEVER  NAUSEA AND VOMITING THAT IS NOT CONTROLLED WITH YOUR NAUSEA MEDICATION  *UNUSUAL SHORTNESS OF BREATH  *UNUSUAL BRUISING OR BLEEDING  TENDERNESS IN MOUTH AND THROAT WITH OR WITHOUT PRESENCE OF ULCERS  *URINARY PROBLEMS  *BOWEL PROBLEMS  UNUSUAL RASH Items with * indicate a potential emergency and should be followed up as soon as possible.  Feel free to call the clinic you have any questions or concerns. The clinic phone number is [phone].  Please show the [facility] at check-in to the Emergency Department and triage nurse.

## 2016-12-27 ENCOUNTER — Encounter: Payer: Self-pay | Admitting: Cardiothoracic Surgery

## 2016-12-27 ENCOUNTER — Ambulatory Visit (INDEPENDENT_AMBULATORY_CARE_PROVIDER_SITE_OTHER): Payer: 59 | Admitting: Cardiothoracic Surgery

## 2016-12-27 VITALS — BP 143/91 | HR 103 | Resp 20 | Ht 70.0 in | Wt 120.0 lb

## 2016-12-27 DIAGNOSIS — C3491 Malignant neoplasm of unspecified part of right bronchus or lung: Secondary | ICD-10-CM | POA: Diagnosis not present

## 2016-12-27 DIAGNOSIS — Z9221 Personal history of antineoplastic chemotherapy: Secondary | ICD-10-CM

## 2016-12-27 DIAGNOSIS — Z923 Personal history of irradiation: Secondary | ICD-10-CM

## 2016-12-27 NOTE — Progress Notes (Signed)
[address]       [place] [address]             [phone]      Gabriel [name] [place] Medical Record #[id number] Date of Birth: Dec 19, 1940  Referring: Curt Bears, MD Primary Care: Cammy Copa, MD  Chief Complaint:   Chest tube placement Non-small cell carcinoma of right lung, stage 3 [facility])   Staging form: Lung, AJCC 7th Edition     Clinical stage from 04/21/2015: Stage IIIA (T2a, N2, M0) - Signed by Curt Bears, MD on 04/21/2015  Foundation One molecular studies reveal that he was positive for [place], [place], and STAG2 [id number]. He was negative for: EGFR, ALK, BRAF, MET, RET, ERBB2 and ROS1  PRIOR THERAPY:  1) Concurrent chemoradiation with chemotherapy in the form of weekly carboplatin for AUC 2 paclitaxel 45 mg/m given concurrent with radiation therapy. First cycle started 05/09/2015. Status post 7 cycles. 2) Consolidation chemotherapy with carboplatin for AUC of 5 on day 1 and gemcitabine 1000 MG/M2 on days 1 and 8 every 3 weeks. First dose 08/15/2015. Discontinued secondary to intolerance.  CURRENT THERAPY: Immunotherapy with Nivolumab 240 MG IV every 2 weeks. 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   He had increasing cough and shortness of breath 6 weeks ago. This did not improve with course of antibiotics and ultimately he was started on tapering dose of steroids which cause marked improvement. He is now on 10 mg of prednisone a day has gained appetite and shortness of breath is improved.   Past Medical History:  Diagnosis Date  . Cancer ([place]    skin cancer  . Constipation    AFTER ANESTHESIA  . Difficulty sleeping   . History of nonmelanoma skin cancer   . Hypertension 10/30/2016  . Inguinal hernia   . Lung mass    right / HAS HAD RADIATION AND CHEMO FOR LUNG CANCER  . Pneumothorax on right 08/24/2015  . Productive cough    "DUE TO RADIATION"  . Radiation 05/11/15-06/21/15   NSCLCA/right lung  . Smoking 1/2 pack a day or less 09/17/2016  . Spontaneous pneumothorax MANY YRS AGO AND AGAIN 04/04/15   HISTORY OF (ONLY)     History  Smoking Status  . Current Every Day Smoker  . Packs/day: 0.25  . Years: 55.00  . Types: Cigarettes  Smokeless Tobacco  . Never Used    Comment: 1/2 ppd 05/15/16    History  Alcohol Use  . 0.0 oz/week    Comment: every other night -social     Allergies  Allergen Reactions  . Aleve [Naproxen] Rash    Current Outpatient Prescriptions  Medication Sig Dispense Refill  . acetaminophen (TYLENOL) 325 MG tablet 2 tablets    . albuterol (PROVENTIL HFA) 108 (90 Base) MCG/ACT inhaler 1-2 puffs as needed    . Arginine (RA L-ARGININE) 1000 MG TABS Take 500 mg by mouth daily.     . Ascorbic Acid (VITAMIN C) 1000 MG tablet Take 1,000 mg by mouth daily. Pt takes 2 5044mtabs    . B Complex Vitamins (B COMPLEX PO) Take 1 capsule by mouth  daily.     . budesonide-formoterol (SYMBICORT) 160-4.5 MCG/ACT inhaler 2 puffs BID 1 Inhaler 5  . CALCIUM CITRATE PO Take 1 tablet by mouth daily.    . Cyanocobalamin (VITAMIN B 12 PO) Take 1 tablet by mouth daily.    . ferrous sulfate 325 (65 FE) MG tablet Take 325 mg by mouth daily with breakfast.    . guaiFENesin (MUCINEX) 600 MG 12 hr tablet Take 1 tablet (600 mg total) by mouth 2 (two) times daily as needed.    . magic mouthwash SOLN Take 5 mLs by mouth 4 (four) times daily as needed.    . magnesium 30 MG tablet Take 30 mg by mouth daily.    . Multiple Vitamin (MULTI VITAMIN DAILY PO) Take 1 tablet by mouth daily.    . Omega-3 Fatty Acids (FISH OIL) 1000 MG CAPS Take 1,000 mg by mouth daily.      . predniSONE (DELTASONE) 20 MG tablet 3 tablets by mouth daily for 1 week, 2 tablets by mouth daily for 1 week, 1 tablet by mouth daily for 1 week and then 0.5 tablet by mouth daily for 1 week until you see Dr. Julien Nordmann. 50 tablet 0  . saccharomyces boulardii (FLORASTOR) 250 MG capsule Take 250 mg by mouth 2 (two) times daily.    . vitamin A 10000 UNIT capsule 1 capsule with food or milk     No current facility-administered medications for this visit.      Wt Readings from Last 3 Encounters:  12/27/16 120 lb (54.4 kg)  12/24/16 120 lb 11.2 oz (54.7 kg)  12/10/16 120 lb (54.4 kg)    Physical Exam: BP (!) 143/91 (BP Location: Right Arm, Patient Position: Sitting, Cuff Size: Normal)   Pulse (!) 103   Resp 20   Ht 5' 10"  (1.778 m)   Wt 120 lb (54.4 kg)   SpO2 97% Comment: ON RA.Marland KitchenMarland KitchenFINGERS VERY COLD  BMI 17.22 kg/m  Wt Readings from Last 3 Encounters:  12/27/16 120 lb (54.4 kg)  12/24/16 120 lb 11.2 oz (54.7 kg)  12/10/16 120 lb (54.4 kg)   General appearance: alert and cooperative Neurologic: intact Heart: regular rate and rhythm, S1, S2 normal, no murmur, click, rub or gallop Lungs: diminished breath sounds Right upper lobe Abdomen: soft, non-tender; bowel sounds normal; no masses,  no organomegaly Extremities: extremities normal, atraumatic, no cyanosis or edema and Homans sign is negative, no sign of DVT There is no cervical or supraclavicular adenopathy no rub  Diagnostic Studies & Laboratory data:     Recent Radiology Findings:  CLINICAL DATA:  One week history of shortness of breath. History of lung cancer.  EXAM: CT ANGIOGRAPHY CHEST WITH CONTRAST  TECHNIQUE: Multidetector CT imaging of the chest was performed using the standard protocol during bolus administration of intravenous contrast. Multiplanar CT image reconstructions and MIPs were obtained to evaluate the vascular anatomy.  CONTRAST:  80 cc Isovue 370  COMPARISON:  10/26/2016  FINDINGS: Chest  wall: No chest wall mass or axillary adenopathy. Stable supraclavicular adenopathy. The largest node measures 11 mm on image number 24.  Cardiovascular: The heart is normal in size. No pericardial effusion. The aorta is normal in caliber. Stable tortuosity and atherosclerotic calcifications. No dissection. The branch vessels are patent. Scattered coronary artery calcifications.  The pulmonary arterial tree is well opacified. No filling defects to suggest pulmonary embolism.  Mediastinum/Nodes: No significant upper abdominal findings. Stable advanced atherosclerotic calcifications involving the abdominal aorta. No obvious hepatic or adrenal  gland metastasis.  No significant bony findings. Stable mid thoracic compression deformity and stable sclerotic change involving the right upper ribs, likely from radiation.  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 No obvious recurrent tumor in this area.  Stable severe emphysematous changes and pulmonary scarring.  Numerous right-sided pulmonary nodules are stable and consistent with metastatic disease.  Stable left lung nodules are also noted.  The left lower lobe process has improved since the prior CT. This is most likely a resolving infection or inflammation.  Upper Abdomen: No significant upper abdominal findings. No obvious hepatic or adrenal gland metastasis.  Musculoskeletal: No significant bony findings. Stable mid thoracic compression deformity and sclerotic change involving the upper right anterior ribs.  Review of the MIP images confirms the above findings.  IMPRESSION: 1. No CT findings for pulmonary embolism. 2. Stable radiation changes involving the right lung and stable pulmonary metastatic disease. 3. Interval partial clearing of the left lower lobe inflammatory or infectious process. 4. Stable  mediastinal/right hilar lymph nodes and supraclavicular lymph nodes. 5. No findings for upper abdominal metastatic disease.   Electronically Signed   By: Marijo Sanes M.D.   On: 11/20/2016 11:58   Ct Abdomen Pelvis  And chest W Contrast  Result Date: 09/01/2016 CLINICAL DATA:  Non-small cell (adenocarcinoma) carcinoma of the right lung, stage IIIA, diagnosed 04/21/2015 status post concurrent chemoradiation therapy and consolidation chemotherapy, with ongoing immunotherapy. Restaging. EXAM: CT CHEST, ABDOMEN, AND PELVIS WITH CONTRAST TECHNIQUE: Multidetector CT imaging of the chest, abdomen and pelvis was performed following the standard protocol during bolus administration of intravenous contrast. CONTRAST:  156m ISOVUE-300 IOPAMIDOL (ISOVUE-300) INJECTION 61% COMPARISON:  07/06/2016 chest CT. 03/12/2016 CT chest, abdomen and pelvis. FINDINGS: CT CHEST FINDINGS Cardiovascular: Normal heart size. No significant pericardial fluid/thickening. Left anterior descending coronary atherosclerosis. Atherosclerotic nonaneurysmal thoracic aorta. Normal caliber pulmonary arteries. No central pulmonary emboli. Mediastinum/Nodes: No discrete thyroid nodules. Stable right deviation of the upper mediastinum. Stable mildly patulous upper thoracic esophagus. No axillary adenopathy. Mild right upper paratracheal adenopathy measuring up to 1.0 cm (series 2/ image 11), stable. No new pathologically enlarged mediastinal or hilar nodes. Lungs/Pleura: No pneumothorax. Stable pleural thickening throughout the right pleural space, most prominent in the upper right pleural space. No pleural effusions. Stable complete consolidation and severe volume loss in the right upper lobe with associated severe bronchiectasis. Moderate centrilobular and paraseptal emphysema with diffuse bronchial wall thickening. Central right lower lobe 2.1 x 1.5 cm nodule (series 2/ image 26), previously 2.0 x 1.5 cm, not appreciably changed. There is  new patchy consolidation, ground-glass opacity and nodularity throughout the right lower lobe, with worsening right lower lobe volume loss, including a new 1.2 cm basilar right lower lobe nodule (series 6/ image 116). There are at least 6 scattered irregular pulmonary nodules throughout the left upper and left lower lobes, not appreciably changed. For example a subpleural anterior apical left upper lobe 1.4 x 1.1 cm nodule (series 6/image 11), previously 1.4 x 1.0 cm, stable. A solid 7 mm superior segment left lower lobe nodule (series 6/image 50), previously 7 mm, stable. The basilar left lower lobe subsolid 2.3 x 2.1 cm nodule (series 6/image 125), previously 2.3 x 2.1 cm using similar measurement technique, stable. Musculoskeletal: No aggressive appearing focal osseous lesions. Stable chronic mild T7 vertebral compression fracture. Mild-to-moderate thoracic spondylosis. CT ABDOMEN PELVIS FINDINGS Hepatobiliary: Normal liver with no liver mass. Normal gallbladder with no radiopaque cholelithiasis.  No biliary ductal dilatation. Pancreas: Normal, with no mass or duct dilation. Spleen: Normal size. No mass. 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 No left adrenal nodules. No hydronephrosis. No renal mass. Relatively collapsed and grossly normal bladder. Stomach/Bowel: Grossly normal stomach. A tiny portion of a right pelvic small bowel loop protrudes into a small right inguinal hernia. No small bowel dilatation, wall thickening, pneumatosis or focal caliber transition. Normal appendix. Mild distal colonic diverticulosis, with no large bowel wall thickening or pericolonic fat stranding. Vascular/Lymphatic: Atherosclerotic abdominal aorta. Stable ectasia of the infrarenal abdominal aorta, maximum diameter 2.5 cm. Patent portal, splenic, hepatic and renal veins. No pathologically enlarged lymph nodes in the abdomen or pelvis. Reproductive: Stable mildly enlarged prostate with  nonspecific internal prostatic calcifications. Other: No pneumoperitoneum, ascites or focal fluid collection. Musculoskeletal: No aggressive appearing focal osseous lesions. Stable scattered small sclerotic lesions in the bilateral pelvic girdle, favor benign bone islands. Marked lumbar spondylosis. IMPRESSION: 1. Central right lower lobe neoplasm is stable. 2. New patchy and nodular consolidation and worsening volume loss in the right lower lobe, favor infection or aspiration given the multifocality and patchy appearance, although new right lower lobe metastatic disease is not excluded. Consider a trial of antibiotic therapy. Short-term follow-up post treatment chest CT is recommended in 3 months. 3. Several irregular pulmonary nodules throughout the left lung are stable, suspicious for metastases and/or synchronous primary bronchogenic malignancies. 4. Stable severe volume loss, consolidation and bronchiectasis in the right upper lobe, most consistent with post treatment change. 5. Stable mild right upper paratracheal lymphadenopathy. No new sites of adenopathy. 6. No metastatic disease in the abdomen or pelvis. 7. Small right inguinal hernia containing a tiny portion of a right pelvic small bowel loop. No evidence of bowel obstruction or ischemia. 8. Additional findings include aortic atherosclerosis, coronary atherosclerosis, moderate emphysema, right adrenal adenoma, mild distal colonic diverticulosis and mild prostatomegaly. Electronically Signed   By: Ilona Sorrel M.D.   On: 09/01/2016 12:33   Ct Chest W Contrast  Result Date: 07/07/2016 CLINICAL DATA:  Non-small-cell carcinoma of right lung, stage III. EXAM: CT CHEST WITH CONTRAST TECHNIQUE: Multidetector CT imaging of the chest was performed during intravenous contrast administration. CONTRAST:  20m ISOVUE-300 IOPAMIDOL (ISOVUE-300) INJECTION 61% COMPARISON:  CT scan of May 10, 2016. FINDINGS: Cardiovascular: Atherosclerosis of thoracic aorta is noted  without aneurysm or dissection. Normal cardiac size. Mediastinum/Nodes: Stable right peritracheal adenopathy is noted with largest lymph node measuring 9 mm. Stable right axillary adenopathy is noted with largest lymph node measuring 6 mm. Lungs/Pleura: No pneumothorax is noted. Extensive postsurgical changes are noted in right upper lobe consistent with prior lobectomy. Mediastinal shift to the right is noted with hyperexpansion of the left lung. Air bronchograms are noted in the right upper lobe which are stable. Pleural thickening is noted in the inferior portion of the right major fissure with probable scarring seen in the right lung base. This is not significantly changed. Stable pleural-based part solid density is seen anteriorly in left lung apex measuring 14 x 10 mm best seen on image number 16 of series 5. Stable sub solid nodular density is noted posteriorly in left upper lobe best seen on image number 39 of series 5. Stable bulla formation is noted in superior segment of left lower lobe. Stable sub solid density measuring 22 x 19 mm is noted in left lung base best seen on image number 126 of series 5. 6 mm solid nodule is noted in  superior segment of left lower lobe best seen on image number 56 of series 5 which is stable compared to prior exam. Upper Abdomen: No definite abnormality seen. 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 IMPRESSION: Stable postsurgical changes involving right upper lobe with mediastinal shift to the right. Stable consolidation is noted in the right upper lobe with air bronchograms. Stable right peritracheal and axillary adenopathy is noted. Stable pleural thickening and scarring is noted in the right lung. Stable 6 mm solid nodule is noted in superior segment of left lower lobe. Stable multiple part solid and sub solid nodules are noted in the left lung as described above. Electronically Signed   By: Marijo Conception, M.D.   On:  07/07/2016 07:56    Ct Chest W ContrastCt Abdomen Pelvis W Contrast 03/12/2016  CLINICAL DATA:  Stage III lung cancer. EXAM: CT CHEST, ABDOMEN, AND PELVIS WITH CONTRAST TECHNIQUE: Multidetector CT imaging of the chest, abdomen and pelvis was performed following the standard protocol during bolus administration of intravenous contrast. CONTRAST:  177m ISOVUE-300 IOPAMIDOL (ISOVUE-300) INJECTION 61% COMPARISON:  Chest CT from 01/03/2016. FINDINGS: CT CHEST FINDINGS Mediastinum/Lymph Nodes: There is no axillary lymphadenopathy. Small right paratracheal lymph nodes are stable as is the small subcarinal lymph node. Abnormal soft tissue attenuation in the right hilum and right infrahilar region, encasing the right lower lobe bronchus is unchanged. The heart size is normal. No pericardial effusion. Coronary artery calcification is noted. The esophagus has normal imaging features. Lungs/Pleura: Volume loss in the right hemi thorax is again noted. Air-fluid level seen in the right apex on the previous study has resolved in the interval. The right upper lobe collapse/consolidation with bronchiectasis is again noted, without substantial change. Continued further decrease in right pleural effusion evident. Centrilobular emphysema noted left upper lobe. Sub solid left lower lobe pulmonary nodule measured previously 2.0 x 2.3 cm now measures 1.9 x 2.2 cm (image 125 series 4). 12 mm adjacent subpleural left lower lobe nodule seen on the previous study has decreased to 7 mm in the interval (image 128). Cavitary sub solid nodule in the superior segment left lower lobe measured previously 2.9 x 2.5 cm is stable at 2.8 x 2.5 cm (image 73). 7 mm nodule seen more cephalad in the left lower lobe on the prior study measures 6 mm today (image 57 ). Ill-defined sub solid nodule in the posterior left upper lobe measured previously at 12 x 25 mm now measures 16 x 23 mm. 15 mm area of anterior left apical scarring/nodularity on the prior  study is now 16 mm (image 16). Musculoskeletal: Bone windows reveal no worrisome lytic or sclerotic osseous lesions. CT ABDOMEN PELVIS FINDINGS Hepatobiliary: No focal abnormality within the liver parenchyma. There is no evidence for gallstones, gallbladder wall thickening, or pericholecystic fluid. 8 mm common bile duct diameter is upper normal for age. Pancreas: Slight prominence of the main pancreatic duct without pancreatic head lesion. Dystrophic calcification in the tail the pancreas may be from prior inflammation. Spleen: No splenomegaly. No focal mass lesion. Adrenals/Urinary Tract: 1.9 cm low-density lesion in the region the right adrenal gland is unchanged in the interval. Left adrenal gland is normal. No evidence for enhancing lesion in either kidney. There is no hydronephrosis. No evidence for hydroureter. Urinary bladder is normal in appearance. Stomach/Bowel: Stomach is nondistended. No gastric wall thickening. No evidence of outlet obstruction. Duodenum is normally positioned as is the ligament of Treitz. No small bowel wall thickening.  No small bowel dilatation. The terminal ileum is normal. Diverticular changes are noted in the left colon without evidence of diverticulitis. Vascular/Lymphatic: There is abdominal aortic atherosclerosis without aneurysm. There is no gastrohepatic or hepatoduodenal ligament lymphadenopathy. No intraperitoneal or retroperitoneal lymphadenopathy. No pelvic sidewall lymphadenopathy. Reproductive: Prostate gland is enlarged. Other: No intraperitoneal free fluid. Musculoskeletal: Bone windows reveal no worrisome lytic or sclerotic osseous lesions. IMPRESSION: 1. Interval resolution of tiny right apical gas collection with stable collapse/consolidation with bronchiectasis in the right upper lobe. 2. Stable appearance of residual soft tissue fullness around the right lower lobe bronchus. 3. Interval decrease in right pleural effusion. 4. No substantial change in the multiple  wall solid and sub solid left lung nodules. 5. Emphysema. 6. Abdominal aortic atherosclerosis. 7. Stable appearance of low-density lesion adjacent to the upper pole the right kidney. Electronically Signed   By: Misty Stanley M.D.   On: 03/12/2016 17:28   Ct Chest W Contrast  01/03/2016  CLINICAL DATA:  Restaging non-small cell carcinoma of right lung. Diagnosed 6/16. Chemotherapy and radiation therapy 8/16. Cough. Shortness of breath on exertion. EXAM: CT CHEST WITH CONTRAST TECHNIQUE: Multidetector CT imaging of the chest was performed during intravenous contrast administration. CONTRAST:  29m OMNIPAQUE IOHEXOL 300 MG/ML  SOLN COMPARISON:  Plain film 11/03/2015.  Most recent CT of 09/30/2015. FINDINGS: Mediastinum/Nodes: No supraclavicular adenopathy. Mild left-sided gynecomastia. Normal heart size with lipomatous hypertrophy of the interatrial septum. Multivessel coronary artery atherosclerosis. No central pulmonary embolism, on this non-dedicated study. No well-defined mediastinal adenopathy. Lungs/Pleura: Right-sided pleural fluid is decreased, tiny. Residual right-sided pleural thickening. Decreased in right apical pneumothorax. Probable secretions in the right mainstem bronchus. Moderate centrilobular emphysema. Persistent right apical and upper lobe dense consolidation with improvement in lateral areas of bronchiectasis. Improvement in right lung base aeration and septal thickening. Residual soft tissue thickening about the origin of the right upper lobe bronchus, which may represent residual neoplasia. Example image 32/ series 5. Sub solid left lower lobe pulmonary nodule measures 2.0 x 2.3 cm on image 53/series 5. Compare 1.6 x 1.5 cm on the prior exam. Development of a pleural-based adjacent left lower lobe pulmonary nodule which measures 12 mm on image 54/series 5. Is also present on coronal image 31. Increased definition of the superior segment left lower lobe partially cavitary sub solid pulmonary  nodule. Example 2.9 x 2.5 cm on image 32/series 5. Compare 3.1 x 2.4 cm on the prior. Soft tissue nodule more cephalad in the left lower lobe measures 7 mm on image 25/ series 5 and is new. Posterior left upper lobe sub solid pulmonary nodule measures 2.5 x 1.2 cm on image 17/series 5 and is similar. Scarring versus sub solid pulmonary nodule on the anterior left apex at 1.5 cm on image 8/series 5. 1.3 cm on the prior. Upper abdomen: Old granulomatous disease in the liver. Normal imaged portions of the spleen, stomach, left adrenal gland, and left kidney. Upper pole right renal collecting system calculus. Right suprarenal nodule measures 1.9 cm on image 60/series 2 and is felt to be similar to on the prior exam. This is present back to 04/12/2015 PET, and not hypermetabolic on that study. Musculoskeletal: Healing fracture of the fourth antrolateral right rib on image 33/ series 2. New since the prior. No underlying osseous metastasis on the prior exam. IMPRESSION: 1. Overall improved appearance of the right hemi thorax. Although there is residual consolidation and bronchiectasis in the right apex and upper lobe, the right lower lobe demonstrates improved  aeration with decreased adjacent pleural fluid and thickening. 2. Residual soft tissue fullness about the origin of the right lower lobe bronchus, suspicious for residual neoplasia. 3. Enlarging sub solid and new/enlarging soft tissue nodules in the left lung, suspicious for metastatic disease. 4.  Atherosclerosis, including within the coronary arteries. 5. 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 6. 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 7. New right fourth rib healing fracture. Favored to be posttraumatic. Correlate with interval probable. Electronically Signed   By: Abigail Miyamoto M.D.   On: 01/03/2016 15:03    Ct Chest Wo Contrast  09/30/2015   CLINICAL DATA:  76 year old male with history of pneumothorax. Followup after chest tube removal. 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 EXAM: CT CHEST WITHOUT CONTRAST TECHNIQUE: Multidetector CT imaging of the chest was performed following the standard protocol without IV contrast. COMPARISON:  Chest CT 08/30/2015. FINDINGS: Mediastinum/Lymph Nodes: Heart size is normal. Small amount of pericardial fluid and/or thickening, unlikely to be of any hemodynamic significance at this time. No associated pericardial calcification. There is atherosclerosis of the thoracic aorta, the great vessels of the mediastinum and the coronary arteries, including calcified atherosclerotic plaque in the left main, left anterior descending, left circumflex and right coronary arteries. Calcifications of the aortic valve. Multiple borderline enlarged and mildly enlarged mediastinal lymph nodes are again noted, measuring up to 1.2 cm in short axis in the right paratracheal nodal station. There is also soft tissue fullness in the right hilar region, suggesting lymphadenopathy (poorly evaluated on today's noncontrast CT examination). Esophagus is unremarkable in appearance. No axillary lymphadenopathy. Lungs/Pleura: When compared to remote prior examination 07/22/2015, there is evidence of severe chronic volume loss in the right hemithorax, which is notably smaller than the contralateral side. There is chronic collapse and consolidation of much of the right lung, most severe in the right upper lobe, where there also appears to be extensive varicose and cystic bronchiectasis. Cavity in the apex of the right lung appears to communicate with a thick walled cavity in the apex of the right pleural space, best appreciated on image 13 of series 3. This thick-walled cavity in the right pleural space appears contiguous with a tract along the anterior aspect of the right pleural space, extending into the lower right  hemithorax, which likely represents the old chest tube tract. Other than this small amount of pleural gas, there is no significant residual pneumothorax. Small chronic thick-walled right pleural effusion posteriorly. Previously demonstrated right middle and lower lobe lesions are now obscured, but as mentioned above, there is extensive soft tissue fullness in the right hilar region, and in the infrahilar aspect of the right lower lobe, which suggests residual neoplasm and/or adenopathy. Again noted is a large mass-like area of ground-glass attenuation, septal thickening and cystic change in the posterior aspect of the superior segment of the left lower lobe (image 30 of series 3), which appears larger than prior examinations, currently measuring 3.1 x 2.4 cm. There is also a irregular ground-glass attenuation lesion with septal thickening in the periphery of the left upper lobe (image 16 of series 3) which measures 2.4 x 1.2 cm, and an irregular sub solid lesion with spiculated margins in the inferior aspect of the left lower lobe (image 52 of series 3) which measures 15 x 16 mm. Upper Abdomen: 3 mm calcification in the interpolar region of the right kidney  may be vascular, or may be a nonobstructive calculus. Musculoskeletal/Soft Tissues: There are no aggressive appearing lytic or blastic lesions noted in the visualized portions of the skeleton. IMPRESSION: 1. Right-sided fibrothorax. 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 2. Worsening chronic consolidation and collapse in the right lung, with extensive nodular septal thickening, and persistent right infrahilar fullness in the central aspect of the right lower lobe, right hilar fullness and mediastinal adenopathy, as above, suggesting residual neoplasm with  right hilar and mediastinal lymphadenopathy. 3. Enlarging sub solid lesions throughout the left lung, as above, concerning for multicentric adenocarcinoma. 4. Moderate centrilobular and paraseptal emphysema. 5. Possible 3 mm nonobstructive calculus in the interpolar collecting system of the right kidney. 6. Atherosclerosis, including left main and 3 vessel coronary artery disease. Electronically Signed   By: Vinnie Langton M.D.   On: 09/30/2015 11:16         Recent Lab Findings: Lab Results  Component Value Date   WBC 11.7 (H) 12/24/2016   HGB 14.1 12/24/2016   HCT 42.8 12/24/2016   PLT 194 12/24/2016   GLUCOSE 80 12/24/2016   ALT 22 12/24/2016   AST 19 12/24/2016   NA 139 12/24/2016   K 4.1 12/24/2016   CL 102 09/12/2015   CREATININE 1.0 12/24/2016   BUN 26.8 (H) 12/24/2016   CO2 27 12/24/2016   TSH 0.941 11/26/2016   INR 1.10 08/01/2015      Assessment / Plan:  Patient with extensive carcinoma the lung currently stable if not improving functionally, with stable findings on recent CT scan under current treatment regimen. Patient appears to be recovering from inflammatory process in the lungs possibly related to his immune therapy short course of steroids has improved his overall clinical status. He continues at a much higher level of functioning than he did a year and a half ago.  I've not made him a return appointment to see me as he seemed very frequently in the [place].     [name]      [place].[address] [place] [address] Office [phone]   Beeper [phone]  12/27/2016 2:20 PM

## 2017-01-07 ENCOUNTER — Encounter: Payer: Self-pay | Admitting: Internal Medicine

## 2017-01-07 ENCOUNTER — Other Ambulatory Visit (HOSPITAL_BASED_OUTPATIENT_CLINIC_OR_DEPARTMENT_OTHER): Payer: 59

## 2017-01-07 ENCOUNTER — Ambulatory Visit (HOSPITAL_BASED_OUTPATIENT_CLINIC_OR_DEPARTMENT_OTHER): Payer: 59

## 2017-01-07 ENCOUNTER — Ambulatory Visit (HOSPITAL_BASED_OUTPATIENT_CLINIC_OR_DEPARTMENT_OTHER): Payer: 59 | Admitting: Internal Medicine

## 2017-01-07 VITALS — BP 165/88 | HR 92 | Resp 20 | Wt 123.3 lb

## 2017-01-07 DIAGNOSIS — C3491 Malignant neoplasm of unspecified part of right bronchus or lung: Secondary | ICD-10-CM

## 2017-01-07 DIAGNOSIS — C3411 Malignant neoplasm of upper lobe, right bronchus or lung: Secondary | ICD-10-CM

## 2017-01-07 DIAGNOSIS — Z5112 Encounter for antineoplastic immunotherapy: Secondary | ICD-10-CM

## 2017-01-07 DIAGNOSIS — R0609 Other forms of dyspnea: Secondary | ICD-10-CM

## 2017-01-07 LAB — CBC WITH DIFFERENTIAL/PLATELET
BASO%: 0.8 % (ref 0.0–2.0)
Basophils Absolute: 0.1 10*3/uL (ref 0.0–0.1)
EOS%: 0.9 % (ref 0.0–7.0)
Eosinophils Absolute: 0.1 10*3/uL (ref 0.0–0.5)
HCT: 42.5 % (ref 38.4–49.9)
HGB: 14.1 g/dL (ref 13.0–17.1)
LYMPH%: 9 % — ABNORMAL LOW (ref 14.0–49.0)
MCH: 29.3 pg (ref 27.2–33.4)
MCHC: 33.1 g/dL (ref 32.0–36.0)
MCV: 88.3 fL (ref 79.3–98.0)
MONO#: 0.7 10*3/uL (ref 0.1–0.9)
MONO%: 6 % (ref 0.0–14.0)
NEUT#: 9.2 10*3/uL — ABNORMAL HIGH (ref 1.5–6.5)
NEUT%: 83.3 % — AB (ref 39.0–75.0)
Platelets: 244 10*3/uL (ref 140–400)
RBC: 4.81 10*6/uL (ref 4.20–5.82)
RDW: 17.1 % — ABNORMAL HIGH (ref 11.0–14.6)
WBC: 11 10*3/uL — ABNORMAL HIGH (ref 4.0–10.3)
lymph#: 1 10*3/uL (ref 0.9–3.3)

## 2017-01-07 LAB — COMPREHENSIVE METABOLIC PANEL
ALT: 22 U/L (ref 0–55)
AST: 20 U/L (ref 5–34)
Albumin: 3.9 g/dL (ref 3.5–5.0)
Alkaline Phosphatase: 91 U/L (ref 40–150)
Anion Gap: 9 mEq/L (ref 3–11)
BUN: 32.2 mg/dL — AB (ref 7.0–26.0)
CALCIUM: 10 mg/dL (ref 8.4–10.4)
CHLORIDE: 107 meq/L (ref 98–109)
CO2: 27 mEq/L (ref 22–29)
Creatinine: 1 mg/dL (ref 0.7–1.3)
EGFR: 73 mL/min/{1.73_m2} — ABNORMAL LOW (ref >90)
Glucose: 95 mg/dl (ref 70–140)
POTASSIUM: 3.6 meq/L (ref 3.5–5.1)
SODIUM: 143 meq/L (ref 136–145)
Total Bilirubin: 0.29 mg/dL (ref 0.20–1.20)
Total Protein: 7.5 g/dL (ref 6.4–8.3)

## 2017-01-07 MED ORDER — SODIUM CHLORIDE 0.9 % IV SOLN
240.0000 mg | Freq: Once | INTRAVENOUS | Status: AC
  Administered 2017-01-07: 240 mg via INTRAVENOUS
  Filled 2017-01-07: qty 20

## 2017-01-07 MED ORDER — SODIUM CHLORIDE 0.9 % IV SOLN
Freq: Once | INTRAVENOUS | Status: AC
  Administered 2017-01-07: 14:00:00 via INTRAVENOUS

## 2017-01-07 NOTE — Patient Instructions (Signed)
[facility] Discharge Instructions for Patients Receiving Chemotherapy  Today you received the following chemotherapy agents:  Nivolumab.  To help prevent nausea and vomiting after your treatment, we encourage you to take your nausea medication as directed.   If you develop nausea and vomiting that is not controlled by your nausea medication, call the clinic.   BELOW ARE SYMPTOMS THAT SHOULD BE REPORTED IMMEDIATELY:  *FEVER GREATER THAN 100.5 F  *CHILLS WITH OR WITHOUT FEVER  NAUSEA AND VOMITING THAT IS NOT CONTROLLED WITH YOUR NAUSEA MEDICATION  *UNUSUAL SHORTNESS OF BREATH  *UNUSUAL BRUISING OR BLEEDING  TENDERNESS IN MOUTH AND THROAT WITH OR WITHOUT PRESENCE OF ULCERS  *URINARY PROBLEMS  *BOWEL PROBLEMS  UNUSUAL RASH Items with * indicate a potential emergency and should be followed up as soon as possible.  Feel free to call the clinic you have any questions or concerns. The clinic phone number is [phone].  Please show the CHEMO ALERT CARD at check-in to the Emergency Department and triage nurse.   

## 2017-01-07 NOTE — Progress Notes (Signed)
[address] [phone]) [phone]   Fax:(336) [phone]  OFFICE PROGRESS NOTE  Cammy Copa, MD [phone] [address] [place] [address]  DIAGNOSIS: Progressive non-small cell lung cancer, adenocarcinoma initially diagnosed as a stage IIIa (T2a, N2, M0) in July 2016.  Foundation One molecular studies reveal that he was positive for [place], [place], and STAG2 S1047f*20. He was negative for: EGFR, ALK, BRAF, MET, RET, ERBB2 and ROS1  PRIOR THERAPY: 1) Concurrent chemoradiation with chemotherapy in the form of weekly carboplatin for AUC 2 paclitaxel 45 mg/m given concurrent with radiation therapy. First cycle started 05/09/2015. Status post 7 cycles. 2) Consolidation chemotherapy with carboplatin for AUC of 5 on day 1 and gemcitabine 1000 MG/M2 on days 1 and 8 every 3 weeks. First dose 08/15/2015. Discontinued secondary to intolerance.  CURRENT THERAPY: Immunotherapy with Nivolumab 240 mg IV every 2 weeks, status post 23 cycles.  INTERVAL HISTORY: Gabriel SIME76y.o. male came to the clinic today for follow-up visit accompanied by his daughter. The patient is feeling fine today with no specific complaints. He is tolerating his treatment with Nivolumab fairly well and has no adverse effect after resuming the treatment 2 weeks ago. He denied having any fever or chills. He has no nausea or vomiting. He denied having any significant chest pain but continues to have shortness of breath with exertion with mild cough and no hemoptysis. He denied having any weight loss or night sweats. He is here today for evaluation before starting cycle #24.  MEDICAL HISTORY: Past Medical History:  Diagnosis Date  . [place]    [place] cancer  . Constipation    AFTER ANESTHESIA  . Difficulty sleeping   . History of nonmelanoma skin cancer   . Hypertension 10/30/2016  . Inguinal hernia   . Lung mass    right / HAS HAD RADIATION AND CHEMO FOR LUNG  CANCER  . Pneumothorax on right 08/24/2015  . Productive cough    "DUE TO RADIATION"  . Radiation 05/11/15-06/21/15   NSCLCA/right lung  . Smoking 1/2 pack a day or less 09/17/2016  . Spontaneous pneumothorax MANY YRS AGO AND AGAIN 04/04/15   HISTORY OF (ONLY)    ALLERGIES:  is allergic to aleve [naproxen].  MEDICATIONS:  Current Outpatient Prescriptions  Medication Sig Dispense Refill  . acetaminophen (TYLENOL) 325 MG tablet 2 tablets    . Arginine (RA L-ARGININE) 1000 MG TABS Take 500 mg by mouth daily.     . Ascorbic Acid (VITAMIN C) 1000 MG tablet Take 1,000 mg by mouth daily. Pt takes 2 5015mtabs    . B Complex Vitamins (B COMPLEX PO) Take 1 capsule by mouth daily.     . budesonide-formoterol (SYMBICORT) 160-4.5 MCG/ACT inhaler 2 puffs BID 1 Inhaler 5  . CALCIUM CITRATE PO Take 1 tablet by mouth daily.    . Cyanocobalamin (VITAMIN B 12 PO) Take 1 tablet by mouth daily.    . ferrous sulfate 325 (65 FE) MG tablet Take 325 mg by mouth daily with breakfast.    . guaiFENesin (MUCINEX) 600 MG 12 hr tablet Take 1 tablet (600 mg total) by mouth 2 (two) times daily as needed.    . magic mouthwash SOLN Take 5 mLs by mouth 4 (four) times daily as needed.    . magnesium 30 MG tablet Take 30 mg by mouth daily.    . Multiple Vitamin (MULTI VITAMIN DAILY PO) Take 1 tablet by mouth  daily.    . Omega-3 Fatty Acids (FISH OIL) 1000 MG CAPS Take 1,000 mg by mouth daily.    . predniSONE (DELTASONE) 20 MG tablet 3 tablets by mouth daily for 1 week, 2 tablets by mouth daily for 1 week, 1 tablet by mouth daily for 1 week and then 0.5 tablet by mouth daily for 1 week until you see Dr. Julien Nordmann. 50 tablet 0  . saccharomyces boulardii (FLORASTOR) 250 MG capsule Take 250 mg by mouth 2 (two) times daily.    . vitamin A 10000 UNIT capsule 1 capsule with food or milk    . albuterol (PROVENTIL HFA) 108 (90 Base) MCG/ACT inhaler 1-2 puffs as needed     No current facility-administered medications for this visit.       SURGICAL HISTORY:  Past Surgical History:  Procedure Laterality Date  . CHEST TUBE INSERTION Right 08/02/2015   Procedure: INSERTION OF SECOND RIGHT CHEST TUBE WITH FLUROSCOPY;  Surgeon: Grace Isaac, MD;  Location: [facility];  Service: Thoracic;  Laterality: Right;  . CHEST TUBE INSERTION  07/28/2015  . COLONOSCOPY W/ BIOPSIES AND POLYPECTOMY    . HERNIA REPAIR  2011  . INGUINAL HERNIA REPAIR Left 07/15/2015   Procedure: OPEN LEFT INGUINAL HERNIA REPAIR;  Surgeon: Johnathan Hausen, MD;  Location: [facility];  Service: General;  Laterality: Left;  With MESH  . TONSILLECTOMY    . VIDEO BRONCHOSCOPY WITH ENDOBRONCHIAL ULTRASOUND N/A 04/18/2015   Procedure: VIDEO BRONCHOSCOPY WITH ENDOBRONCHIAL ULTRASOUND;  Surgeon: Grace Isaac, MD;  Location: [facility];  Service: Thoracic;  Laterality: N/A;  . VIDEO BRONCHOSCOPY WITH INSERTION OF INTERBRONCHIAL VALVE (IBV) N/A 08/02/2015   Procedure: VIDEO BRONCHOSCOPY WITH ATTEMPTED INSERTION OF INTERBRONCHIAL VALVE (IBV) WITH FLUROSCOPY;  Surgeon: Grace Isaac, MD;  [facility];  Service: Thoracic;  Laterality: N/A;  . WISDOM TOOTH EXTRACTION      REVIEW OF SYSTEMS:  A comprehensive review of systems was negative except for: Respiratory: positive for cough and dyspnea on exertion   PHYSICAL EXAMINATION: General appearance: alert, cooperative and no distress Head: Normocephalic, without obvious abnormality, atraumatic Neck: no adenopathy, no JVD, supple, symmetrical, trachea midline and thyroid not enlarged, symmetric, no tenderness/mass/nodules Lymph nodes: Cervical, supraclavicular, and axillary nodes normal. Resp: clear to auscultation bilaterally Back: symmetric, no curvature. ROM normal. No CVA tenderness. Cardio: regular rate and rhythm, S1, S2 normal, no murmur, click, rub or gallop GI: soft, non-tender; bowel sounds normal; no masses,  no organomegaly Extremities: extremities normal, atraumatic, no cyanosis or edema  ECOG PERFORMANCE  STATUS: 1 - Symptomatic but completely ambulatory  There were no vitals taken for this visit.  LABORATORY DATA: Lab Results  Component Value Date   WBC 11.0 (H) 01/07/2017   HGB 14.1 01/07/2017   HCT 42.5 01/07/2017   MCV 88.3 01/07/2017   PLT 244 01/07/2017      Chemistry      Component Value Date/Time   NA 143 01/07/2017 1322   K 3.6 01/07/2017 1322   CL 102 09/12/2015 0410   CO2 27 01/07/2017 1322   BUN 32.2 (H) 01/07/2017 1322   CREATININE 1.0 01/07/2017 1322      Component Value Date/Time   CALCIUM 10.0 01/07/2017 1322   ALKPHOS 91 01/07/2017 1322   AST 20 01/07/2017 1322   ALT 22 01/07/2017 1322   BILITOT 0.29 01/07/2017 1322       RADIOGRAPHIC STUDIES: No results found.  ASSESSMENT AND PLAN: This is a very pleasant 76 years old  white male with metastatic non-small cell lung cancer, adenocarcinoma initially diagnosed as locally advanced disease status post concurrent chemoradiation with weekly carboplatin and paclitaxel with partial response. This was followed by disease progression and the patient is currently on treatment with immunotherapy with Nivolumab status post 23 cycles. He tolerated the last cycle of his treatment well with no significant adverse effects. I recommended for him to proceed with cycle #24 today as scheduled. I will see him back for follow-up visit in 2 weeks for evaluation before starting cycle #25. He was advised to call immediately if he has any concerning symptoms in the interval. The patient voices understanding of current disease status and treatment options and is in agreement with the current care plan. All questions were answered. The patient knows to call the clinic with any problems, questions or concerns. We can certainly see the patient much sooner if necessary. I spent 10 minutes counseling the patient face to face. The total time spent in the appointment was 15 minutes. Disclaimer: This note was dictated with voice recognition  software. Similar sounding words can inadvertently be transcribed and may not be corrected upon review.

## 2017-01-16 ENCOUNTER — Telehealth: Payer: Self-pay | Admitting: Medical Oncology

## 2017-01-16 NOTE — Telephone Encounter (Signed)
Cataract surgery Friday , can he still get immunotherapy on following Monday?

## 2017-01-16 NOTE — Telephone Encounter (Signed)
Per Julien Nordmann I told pt it should be okay for him to get ts on Monday.

## 2017-01-21 ENCOUNTER — Other Ambulatory Visit (HOSPITAL_BASED_OUTPATIENT_CLINIC_OR_DEPARTMENT_OTHER): Payer: 59

## 2017-01-21 ENCOUNTER — Encounter: Payer: Self-pay | Admitting: Internal Medicine

## 2017-01-21 ENCOUNTER — Ambulatory Visit (HOSPITAL_BASED_OUTPATIENT_CLINIC_OR_DEPARTMENT_OTHER): Payer: 59

## 2017-01-21 ENCOUNTER — Ambulatory Visit (HOSPITAL_BASED_OUTPATIENT_CLINIC_OR_DEPARTMENT_OTHER): Payer: 59 | Admitting: Internal Medicine

## 2017-01-21 VITALS — BP 153/75 | HR 91 | Temp 97.5°F | Resp 18 | Ht 70.0 in | Wt 123.2 lb

## 2017-01-21 DIAGNOSIS — Z5112 Encounter for antineoplastic immunotherapy: Secondary | ICD-10-CM

## 2017-01-21 DIAGNOSIS — J449 Chronic obstructive pulmonary disease, unspecified: Secondary | ICD-10-CM

## 2017-01-21 DIAGNOSIS — C3491 Malignant neoplasm of unspecified part of right bronchus or lung: Secondary | ICD-10-CM

## 2017-01-21 DIAGNOSIS — R05 Cough: Secondary | ICD-10-CM

## 2017-01-21 DIAGNOSIS — R0602 Shortness of breath: Secondary | ICD-10-CM | POA: Diagnosis not present

## 2017-01-21 LAB — COMPREHENSIVE METABOLIC PANEL
ALK PHOS: 100 U/L (ref 40–150)
ALT: 24 U/L (ref 0–55)
ANION GAP: 13 meq/L — AB (ref 3–11)
AST: 21 U/L (ref 5–34)
Albumin: 4.1 g/dL (ref 3.5–5.0)
BUN: 33 mg/dL — ABNORMAL HIGH (ref 7.0–26.0)
CALCIUM: 10 mg/dL (ref 8.4–10.4)
CO2: 23 meq/L (ref 22–29)
Chloride: 107 mEq/L (ref 98–109)
Creatinine: 1.1 mg/dL (ref 0.7–1.3)
EGFR: 67 mL/min/{1.73_m2} — AB (ref >90)
GLUCOSE: 96 mg/dL (ref 70–140)
Potassium: 3.9 mEq/L (ref 3.5–5.1)
Sodium: 142 mEq/L (ref 136–145)
TOTAL PROTEIN: 8 g/dL (ref 6.4–8.3)
Total Bilirubin: 0.31 mg/dL (ref 0.20–1.20)

## 2017-01-21 LAB — CBC WITH DIFFERENTIAL/PLATELET
BASO%: 0.3 % (ref 0.0–2.0)
BASOS ABS: 0 10*3/uL (ref 0.0–0.1)
EOS%: 1.1 % (ref 0.0–7.0)
Eosinophils Absolute: 0.1 10*3/uL (ref 0.0–0.5)
HEMATOCRIT: 44.9 % (ref 38.4–49.9)
HGB: 15 g/dL (ref 13.0–17.1)
LYMPH%: 10.9 % — AB (ref 14.0–49.0)
MCH: 29.6 pg (ref 27.2–33.4)
MCHC: 33.4 g/dL (ref 32.0–36.0)
MCV: 88.6 fL (ref 79.3–98.0)
MONO#: 0.7 10*3/uL (ref 0.1–0.9)
MONO%: 7.5 % (ref 0.0–14.0)
NEUT#: 7 10*3/uL — ABNORMAL HIGH (ref 1.5–6.5)
NEUT%: 80.2 % — ABNORMAL HIGH (ref 39.0–75.0)
NRBC: 0 % (ref 0–0)
Platelets: 215 10*3/uL (ref 140–400)
RBC: 5.07 10*6/uL (ref 4.20–5.82)
RDW: 16.8 % — ABNORMAL HIGH (ref 11.0–14.6)
WBC: 8.8 10*3/uL (ref 4.0–10.3)
lymph#: 1 10*3/uL (ref 0.9–3.3)

## 2017-01-21 MED ORDER — SODIUM CHLORIDE 0.9 % IV SOLN
Freq: Once | INTRAVENOUS | Status: AC
  Administered 2017-01-21: 13:00:00 via INTRAVENOUS

## 2017-01-21 MED ORDER — SODIUM CHLORIDE 0.9 % IV SOLN
240.0000 mg | Freq: Once | INTRAVENOUS | Status: AC
  Administered 2017-01-21: 240 mg via INTRAVENOUS
  Filled 2017-01-21: qty 24

## 2017-01-21 NOTE — Progress Notes (Signed)
[place] Telephone:(336) [phone]   Fax:(336) [phone]  OFFICE PROGRESS NOTE  Cammy Copa, MD [phone] [address], [address] [place] [address]  DIAGNOSIS: Progressive non-small cell lung cancer, adenocarcinoma initially diagnosed as stage IIIa (T2a, N2, M0) in July 2016.  Foundation One molecular studies reveal that he was positive for [place], [place], and STAG2 S1063f*20. He was negative for: EGFR, ALK, BRAF, MET, RET, ERBB2 and ROS1  PRIOR THERAPY: 1) Concurrent chemoradiation with chemotherapy in the form of weekly carboplatin for AUC 2 paclitaxel 45 mg/m given concurrent with radiation therapy. First cycle started 05/09/2015. Status post 7 cycles. 2) Consolidation chemotherapy with carboplatin for AUC of 5 on day 1 and gemcitabine 1000 MG/M2 on days 1 and 8 every 3 weeks. First dose 08/15/2015. Discontinued secondary to intolerance.  CURRENT THERAPY: Immunotherapy with Nivolumab 240 mg IV every 2 weeks, status post 24 cycles.  INTERVAL HISTORY: Gabriel [id number].o. male returns to the clinic today for follow-up visit accompanied by [name] The patient is feeling fine today with no specific complaints except for the baseline shortness of breath secondary to COPD. He had a recent cataract surgery. He denied having any chest pain but continues to have mild cough and no hemoptysis. He denied having any significant weight loss or night sweats. No significant nausea, vomiting, diarrhea or constipation. He is here today for evaluation before starting cycle #25.  MEDICAL HISTORY: Past Medical History:  Diagnosis Date  . Cancer ([place]    skin cancer  . Constipation    AFTER ANESTHESIA  . Difficulty sleeping   . History of nonmelanoma skin cancer   . Hypertension 10/30/2016  . Inguinal hernia   . Lung mass    right / HAS HAD RADIATION AND CHEMO FOR LUNG CANCER  . Pneumothorax on right 08/24/2015  . Productive cough    "DUE TO  RADIATION"  . Radiation 05/11/15-06/21/15   NSCLCA/right lung  . Smoking 1/2 pack a day or less 09/17/2016  . Spontaneous pneumothorax MANY YRS AGO AND AGAIN 04/04/15   HISTORY OF (ONLY)    ALLERGIES:  is allergic to aleve [naproxen].  MEDICATIONS:  Current Outpatient Prescriptions  Medication Sig Dispense Refill  . Arginine (RA L-ARGININE) 1000 MG TABS Take 500 mg by mouth daily.     . Ascorbic Acid (VITAMIN C) 1000 MG tablet Take 1,000 mg by mouth daily. Pt takes 2 5027mtabs    . B Complex Vitamins (B COMPLEX PO) Take 1 capsule by mouth daily.     . budesonide-formoterol (SYMBICORT) 160-4.5 MCG/ACT inhaler 2 puffs BID 1 Inhaler 5  . CALCIUM CITRATE PO Take 1 tablet by mouth daily.    . Cyanocobalamin (VITAMIN B 12 PO) Take 1 tablet by mouth daily.    . DUREZOL 0.05 % EMUL Place 1 drop into the right eye 2 (two) times daily.    . ferrous sulfate 325 (65 FE) MG tablet Take 325 mg by mouth daily with breakfast.    . guaiFENesin (MUCINEX) 600 MG 12 hr tablet Take 1 tablet (600 mg total) by mouth 2 (two) times daily as needed.    . Marland Kitchenetorolac (ACULAR) 0.5 % ophthalmic solution Place 1 drop into the right eye 2 (two) times daily.    . magic mouthwash SOLN Take 5 mLs by mouth 4 (four) times daily as needed.    . magnesium 30 MG tablet Take 30 mg by mouth daily.    .Marland Kitchen  Multiple Vitamin (MULTI VITAMIN DAILY PO) Take 1 tablet by mouth daily.    . nivolumab (OPDIVO) 100 MG/10ML SOLN chemo injection Inject into the vein.    Marland Kitchen ofloxacin (OCUFLOX) 0.3 % ophthalmic solution Place 1 drop into the right eye 2 (two) times daily.    . Omega-3 Fatty Acids (FISH OIL) 1000 MG CAPS Take 1,000 mg by mouth daily.    . predniSONE (DELTASONE) 20 MG tablet 3 tablets by mouth daily for 1 week, 2 tablets by mouth daily for 1 week, 1 tablet by mouth daily for 1 week and then 0.5 tablet by mouth daily for 1 week until you see Dr. Julien Nordmann. 50 tablet 0  . saccharomyces boulardii (FLORASTOR) 250 MG capsule Take 250 mg by  mouth 2 (two) times daily.    . vitamin A 10000 UNIT capsule 1 capsule with food or milk    . acetaminophen (TYLENOL) 325 MG tablet 2 tablets    . albuterol (PROVENTIL HFA) 108 (90 Base) MCG/ACT inhaler 1-2 puffs as needed     No current facility-administered medications for this visit.     SURGICAL HISTORY:  Past Surgical History:  Procedure Laterality Date  . CHEST TUBE INSERTION Right 08/02/2015   Procedure: INSERTION OF SECOND RIGHT CHEST TUBE WITH FLUROSCOPY;  Surgeon: Grace Isaac, MD;  Location: [place] [facility];  Service: Thoracic;  Laterality: Right;  . CHEST TUBE INSERTION  07/28/2015  . COLONOSCOPY W/ BIOPSIES AND POLYPECTOMY    . HERNIA REPAIR  2011  . INGUINAL HERNIA REPAIR Left 07/15/2015   Procedure: OPEN LEFT INGUINAL HERNIA REPAIR;  Surgeon: Johnathan Hausen, MD;  [facility];  Service: General;  Laterality: Left;  With MESH  . TONSILLECTOMY    . VIDEO BRONCHOSCOPY WITH ENDOBRONCHIAL ULTRASOUND N/A 04/18/2015   Procedure: VIDEO BRONCHOSCOPY WITH ENDOBRONCHIAL ULTRASOUND;  Surgeon: Grace Isaac, MD;  Location: [facility];  Service: Thoracic;  Laterality: N/A;  . VIDEO BRONCHOSCOPY WITH INSERTION OF INTERBRONCHIAL VALVE (IBV) N/A 08/02/2015   Procedure: VIDEO BRONCHOSCOPY WITH ATTEMPTED INSERTION OF INTERBRONCHIAL VALVE (IBV) WITH FLUROSCOPY;  Surgeon: Grace Isaac, MD;  [facility];  Service: Thoracic;  Laterality: N/A;  . WISDOM TOOTH EXTRACTION      REVIEW OF SYSTEMS:  A comprehensive review of systems was negative except for: Respiratory: positive for cough and dyspnea on exertion   PHYSICAL EXAMINATION: General appearance: alert, cooperative and no distress Head: Normocephalic, without obvious abnormality, atraumatic Neck: no adenopathy, no JVD, supple, symmetrical, trachea midline and thyroid not enlarged, symmetric, no tenderness/mass/nodules Lymph nodes: Cervical, supraclavicular, and axillary nodes normal. Resp: clear to auscultation bilaterally Back:  symmetric, no curvature. ROM normal. No CVA tenderness. Cardio: regular rate and rhythm, S1, S2 normal, no murmur, click, rub or gallop GI: soft, non-tender; bowel sounds normal; no masses,  no organomegaly Extremities: extremities normal, atraumatic, no cyanosis or edema  ECOG PERFORMANCE STATUS: 1 - Symptomatic but completely ambulatory  Blood pressure (!) 153/75, pulse 91, temperature 97.5 F (36.4 C), temperature source Oral, resp. rate 18, height _0  (1.778 m), weight 123 lb 3.2 oz (55.9 kg), SpO2 97 %.  LABORATORY DATA: Lab Results  Component Value Date   WBC 8.8 01/21/2017   HGB 15.0 01/21/2017   HCT 44.9 01/21/2017   MCV 88.6 01/21/2017   PLT 215 01/21/2017      Chemistry      Component Value Date/Time   NA 142 01/21/2017 1158   K 3.9 01/21/2017 1158   CL 102 09/12/2015 0410  CO2 23 01/21/2017 1158   BUN 33.0 (H) 01/21/2017 1158   CREATININE 1.1 01/21/2017 1158      Component Value Date/Time   CALCIUM 10.0 01/21/2017 1158   ALKPHOS 100 01/21/2017 1158   AST 21 01/21/2017 1158   ALT 24 01/21/2017 1158   BILITOT 0.31 01/21/2017 1158       RADIOGRAPHIC STUDIES: No results found.  ASSESSMENT AND PLAN: This is a very pleasant 76 years old white male with metastatic non-small cell lung cancer, adenocarcinoma initially diagnosed as locally advanced disease is status post concurrent chemoradiation followed by consolidation chemotherapy discontinued secondary to disease progression and intolerance. He is currently on treatment with immunotherapy with Nivolumab every 2 weeks is status post 24 cycles. He is doing fine and tolerating the treatment well. We will proceed with cycle #25 today as a scheduled. I will see him back for follow-up visit in 2 weeks for reevaluation with repeat CT scan of the chest for restaging of his disease. He was advised to call immediately if he has any concerning symptoms in the interval.  The patient voices understanding of current  disease status and treatment options and is in agreement with the current care plan. All questions were answered. The patient knows to call the clinic with any problems, questions or concerns. We can certainly see the patient much sooner if necessary. I spent 10 minutes counseling the patient face to face. The total time spent in the appointment was 15 minutes.  Disclaimer: This note was dictated with voice recognition software. Similar sounding words can inadvertently be transcribed and may not be corrected upon review.

## 2017-01-21 NOTE — Patient Instructions (Signed)
Steps to Quit Smoking Smoking tobacco can be bad for your health. It can also affect almost every organ in your body. Smoking puts you and people around you at risk for many serious long-lasting (chronic) diseases. Quitting smoking is hard, but it is one of the best things that you can do for your health. It is never too late to quit. What are the benefits of quitting smoking? When you quit smoking, you lower your risk for getting serious diseases and conditions. They can include:  Lung cancer or lung disease.  Heart disease.  Stroke.  Heart attack.  Not being able to have children (infertility).  Weak bones (osteoporosis) and broken bones (fractures). If you have coughing, wheezing, and shortness of breath, those symptoms may get better when you quit. You may also get sick less often. If you are pregnant, quitting smoking can help to lower your chances of having a baby of low birth weight. What can I do to help me quit smoking? Talk with your doctor about what can help you quit smoking. Some things you can do (strategies) include:  Quitting smoking totally, instead of slowly cutting back how much you smoke over a period of time.  Going to in-person counseling. You are more likely to quit if you go to many counseling sessions.  Using resources and support systems, such as:  Online chats with a counselor.  Phone quitlines.  Printed self-help materials.  Support groups or group counseling.  Text messaging programs.  Mobile phone apps or applications.  Taking medicines. Some of these medicines may have nicotine in them. If you are pregnant or breastfeeding, do not take any medicines to quit smoking unless your doctor says it is okay. Talk with your doctor about counseling or other things that can help you. Talk with your doctor about using more than one strategy at the same time, such as taking medicines while you are also going to in-person counseling. This can help make quitting  easier. What things can I do to make it easier to quit? Quitting smoking might feel very hard at first, but there is a lot that you can do to make it easier. Take these steps:  Talk to your family and friends. Ask them to support and encourage you.  Call phone quitlines, reach out to support groups, or work with a counselor.  Ask people who smoke to not smoke around you.  Avoid places that make you want (trigger) to smoke, such as:  Bars.  Parties.  Smoke-break areas at work.  Spend time with people who do not smoke.  Lower the stress in your life. Stress can make you want to smoke. Try these things to help your stress:  Getting regular exercise.  Deep-breathing exercises.  Yoga.  Meditating.  Doing a body scan. To do this, close your eyes, focus on one area of your body at a time from head to toe, and notice which parts of your body are tense. Try to relax the muscles in those areas.  Download or buy apps on your mobile phone or tablet that can help you stick to your quit plan. There are many free apps, such as QuitGuide from the CDC (Centers for Disease Control and Prevention). You can find more support from smokefree.gov and other websites. This information is not intended to replace advice given to you by your health care provider. Make sure you discuss any questions you have with your health care provider. Document Released: 08/11/2009 Document Revised: 06/12/2016 Document   Reviewed: 03/01/2015 Elsevier Interactive Patient Education  2017 Elsevier Inc.  

## 2017-01-21 NOTE — Patient Instructions (Signed)
[facility]cer Center Discharge Instructions for Patients Receiving Chemotherapy  Today you received the following chemotherapy agents:  Nivolumab.  To help prevent nausea and vomiting after your treatment, we encourage you to take your nausea medication as directed.   If you develop nausea and vomiting that is not controlled by your nausea medication, call the clinic.   BELOW ARE SYMPTOMS THAT SHOULD BE REPORTED IMMEDIATELY:  *FEVER GREATER THAN 100.5 F  *CHILLS WITH OR WITHOUT FEVER  NAUSEA AND VOMITING THAT IS NOT CONTROLLED WITH YOUR NAUSEA MEDICATION  *UNUSUAL SHORTNESS OF BREATH  *UNUSUAL BRUISING OR BLEEDING  TENDERNESS IN MOUTH AND THROAT WITH OR WITHOUT PRESENCE OF ULCERS  *URINARY PROBLEMS  *BOWEL PROBLEMS  UNUSUAL RASH Items with * indicate a potential emergency and should be followed up as soon as possible.  Feel free to call the clinic you have any questions or concerns. The clinic phone number is [phone].  Please show the CHEMO ALERT CARD at check-in to the Emergency Department and triage nurse.   

## 2017-01-24 ENCOUNTER — Telehealth: Payer: Self-pay | Admitting: Internal Medicine

## 2017-01-24 NOTE — Telephone Encounter (Signed)
Scheduled appts per 01/21/2017 los. Patient is aware of new appt date and time added.

## 2017-02-01 ENCOUNTER — Other Ambulatory Visit: Payer: Self-pay

## 2017-02-01 ENCOUNTER — Ambulatory Visit (HOSPITAL_COMMUNITY)
Admission: RE | Admit: 2017-02-01 | Discharge: 2017-02-01 | Disposition: A | Discharge status: Home / Self Care | Payer: 59 | Source: Ambulatory Visit | Attending: Internal Medicine | Admitting: Internal Medicine

## 2017-02-01 DIAGNOSIS — C3491 Malignant neoplasm of unspecified part of right bronchus or lung: Secondary | ICD-10-CM | POA: Insufficient documentation

## 2017-02-01 DIAGNOSIS — R918 Other nonspecific abnormal finding of lung field: Secondary | ICD-10-CM | POA: Insufficient documentation

## 2017-02-01 DIAGNOSIS — Z5112 Encounter for antineoplastic immunotherapy: Secondary | ICD-10-CM | POA: Diagnosis not present

## 2017-02-01 DIAGNOSIS — J470 Bronchiectasis with acute lower respiratory infection: Secondary | ICD-10-CM | POA: Diagnosis not present

## 2017-02-01 DIAGNOSIS — J449 Chronic obstructive pulmonary disease, unspecified: Secondary | ICD-10-CM

## 2017-02-01 DIAGNOSIS — C7800 Secondary malignant neoplasm of unspecified lung: Secondary | ICD-10-CM | POA: Insufficient documentation

## 2017-02-01 DIAGNOSIS — J984 Other disorders of lung: Secondary | ICD-10-CM | POA: Diagnosis not present

## 2017-02-01 MED ORDER — IOPAMIDOL (ISOVUE-300) INJECTION 61%
75.0000 mL | Freq: Once | INTRAVENOUS | Status: AC | PRN
  Administered 2017-02-01: 75 mL via INTRAVENOUS

## 2017-02-01 MED ORDER — IOPAMIDOL (ISOVUE-300) INJECTION 61%
INTRAVENOUS | Status: AC
  Filled 2017-02-01: qty 75

## 2017-02-04 ENCOUNTER — Other Ambulatory Visit (HOSPITAL_BASED_OUTPATIENT_CLINIC_OR_DEPARTMENT_OTHER): Payer: 59

## 2017-02-04 ENCOUNTER — Ambulatory Visit (HOSPITAL_BASED_OUTPATIENT_CLINIC_OR_DEPARTMENT_OTHER): Payer: 59 | Admitting: Internal Medicine

## 2017-02-04 ENCOUNTER — Ambulatory Visit (HOSPITAL_BASED_OUTPATIENT_CLINIC_OR_DEPARTMENT_OTHER): Payer: 59

## 2017-02-04 ENCOUNTER — Encounter: Payer: Self-pay | Admitting: Internal Medicine

## 2017-02-04 VITALS — BP 127/79 | HR 102 | Temp 97.6°F | Resp 18 | Ht 70.0 in | Wt 118.3 lb

## 2017-02-04 DIAGNOSIS — R490 Dysphonia: Secondary | ICD-10-CM | POA: Diagnosis not present

## 2017-02-04 DIAGNOSIS — R062 Wheezing: Secondary | ICD-10-CM

## 2017-02-04 DIAGNOSIS — Z5112 Encounter for antineoplastic immunotherapy: Secondary | ICD-10-CM

## 2017-02-04 DIAGNOSIS — C3491 Malignant neoplasm of unspecified part of right bronchus or lung: Secondary | ICD-10-CM | POA: Diagnosis not present

## 2017-02-04 DIAGNOSIS — J449 Chronic obstructive pulmonary disease, unspecified: Secondary | ICD-10-CM

## 2017-02-04 DIAGNOSIS — Z79899 Other long term (current) drug therapy: Secondary | ICD-10-CM | POA: Diagnosis not present

## 2017-02-04 DIAGNOSIS — R112 Nausea with vomiting, unspecified: Secondary | ICD-10-CM | POA: Diagnosis not present

## 2017-02-04 DIAGNOSIS — R05 Cough: Secondary | ICD-10-CM

## 2017-02-04 DIAGNOSIS — R530 Neoplastic (malignant) related fatigue: Secondary | ICD-10-CM

## 2017-02-04 DIAGNOSIS — J441 Chronic obstructive pulmonary disease with (acute) exacerbation: Secondary | ICD-10-CM

## 2017-02-04 LAB — COMPREHENSIVE METABOLIC PANEL
ALBUMIN: 4.2 g/dL (ref 3.5–5.0)
ALT: 25 U/L (ref 0–55)
AST: 22 U/L (ref 5–34)
Alkaline Phosphatase: 101 U/L (ref 40–150)
Anion Gap: 14 mEq/L — ABNORMAL HIGH (ref 3–11)
BILIRUBIN TOTAL: 0.42 mg/dL (ref 0.20–1.20)
BUN: 26.8 mg/dL — ABNORMAL HIGH (ref 7.0–26.0)
CO2: 24 meq/L (ref 22–29)
Calcium: 10.2 mg/dL (ref 8.4–10.4)
Chloride: 104 mEq/L (ref 98–109)
Creatinine: 1.2 mg/dL (ref 0.7–1.3)
EGFR: 56 mL/min/{1.73_m2} — AB (ref >90)
GLUCOSE: 92 mg/dL (ref 70–140)
Potassium: 4 mEq/L (ref 3.5–5.1)
SODIUM: 142 meq/L (ref 136–145)
TOTAL PROTEIN: 8 g/dL (ref 6.4–8.3)

## 2017-02-04 LAB — CBC WITH DIFFERENTIAL/PLATELET
BASO%: 0.3 % (ref 0.0–2.0)
Basophils Absolute: 0 10*3/uL (ref 0.0–0.1)
EOS%: 0.7 % (ref 0.0–7.0)
Eosinophils Absolute: 0.1 10*3/uL (ref 0.0–0.5)
HCT: 45 % (ref 38.4–49.9)
HGB: 15.2 g/dL (ref 13.0–17.1)
LYMPH%: 7.4 % — ABNORMAL LOW (ref 14.0–49.0)
MCH: 30 pg (ref 27.2–33.4)
MCHC: 33.8 g/dL (ref 32.0–36.0)
MCV: 88.9 fL (ref 79.3–98.0)
MONO#: 0.9 10*3/uL (ref 0.1–0.9)
MONO%: 6.5 % (ref 0.0–14.0)
NEUT%: 85.1 % — ABNORMAL HIGH (ref 39.0–75.0)
NEUTROS ABS: 11.1 10*3/uL — AB (ref 1.5–6.5)
NRBC: 0 % (ref 0–0)
Platelets: 213 10*3/uL (ref 140–400)
RBC: 5.06 10*6/uL (ref 4.20–5.82)
RDW: 16.2 % — AB (ref 11.0–14.6)
WBC: 13 10*3/uL — AB (ref 4.0–10.3)
lymph#: 1 10*3/uL (ref 0.9–3.3)

## 2017-02-04 LAB — TSH: TSH: 0.45 m[IU]/L (ref 0.320–4.118)

## 2017-02-04 MED ORDER — PROCHLORPERAZINE MALEATE 10 MG PO TABS: 10.0000 mg | ORAL_TABLET | Freq: Four times a day (QID) | ORAL | 0 refills | Status: DC | PRN

## 2017-02-04 MED ORDER — SODIUM CHLORIDE 0.9 % IV SOLN
240.0000 mg | Freq: Once | INTRAVENOUS | Status: AC
  Administered 2017-02-04: 240 mg via INTRAVENOUS
  Filled 2017-02-04: qty 24

## 2017-02-04 MED ORDER — SODIUM CHLORIDE 0.9 % IV SOLN
Freq: Once | INTRAVENOUS | Status: AC
  Administered 2017-02-04: 16:00:00 via INTRAVENOUS

## 2017-02-04 MED ORDER — ONDANSETRON HCL 8 MG PO TABS: 8.0000 mg | ORAL_TABLET | Freq: Three times a day (TID) | ORAL | 0 refills | Status: DC | PRN

## 2017-02-04 NOTE — Patient Instructions (Signed)
[facility] Discharge Instructions for Patients Receiving Chemotherapy  Today you received the following chemotherapy agents:  Nivolumab.  To help prevent nausea and vomiting after your treatment, we encourage you to take your nausea medication as directed.   If you develop nausea and vomiting that is not controlled by your nausea medication, call the clinic.   BELOW ARE SYMPTOMS THAT SHOULD BE REPORTED IMMEDIATELY:  *FEVER GREATER THAN 100.5 F  *CHILLS WITH OR WITHOUT FEVER  NAUSEA AND VOMITING THAT IS NOT CONTROLLED WITH YOUR NAUSEA MEDICATION  *UNUSUAL SHORTNESS OF BREATH  *UNUSUAL BRUISING OR BLEEDING  TENDERNESS IN MOUTH AND THROAT WITH OR WITHOUT PRESENCE OF ULCERS  *URINARY PROBLEMS  *BOWEL PROBLEMS  UNUSUAL RASH Items with * indicate a potential emergency and should be followed up as soon as possible.  Feel free to call the clinic you have any questions or concerns. The clinic phone number is [phone].  Please show the CHEMO ALERT CARD at check-in to the Emergency Department and triage nurse.   

## 2017-02-04 NOTE — Progress Notes (Signed)
[place] Telephone:([phone]   [phone]  OFFICE PROGRESS NOTE  Cammy Copa, MD [phone] [address], [address] [place] [address]  DIAGNOSIS: Progressive non-small cell lung cancer, adenocarcinoma initially diagnosed as stage IIIa (T2a, N2, M0) in July 2016.  Foundation One molecular studies reveal that he was positive for [place], [place], and STAG2 S109f*20. He was negative for: EGFR, ALK, BRAF, MET, RET, ERBB2 and ROS1  PRIOR THERAPY: 1) Concurrent chemoradiation with chemotherapy in the form of weekly carboplatin for AUC 2 paclitaxel 45 mg/m given concurrent with radiation therapy. First cycle started 05/09/2015. Status post 7 cycles. 2) Consolidation chemotherapy with carboplatin for AUC of 5 on day 1 and gemcitabine 1000 MG/M2 on days 1 and 8 every 3 weeks. First dose 08/15/2015. Discontinued secondary to intolerance.  CURRENT THERAPY: Immunotherapy with Nivolumab 240 mg IV every 2 weeks, status post 25 cycles.  INTERVAL HISTORY: Gabriel [id number].[id number]. male returns to the clinic today for follow-up visit accompanied by his son BKanaiand daughter Gabriel [name] The patient is feeling fine today was no specific complaints except for the persistent cough and wheezing. He also continues to have mild hoarseness of his voice. He was evaluated by ENT in the past and was advised to take a proton pump inhibitor for questionable acid reflux. He does not take the medication because it did not help much. He denied having any current fever or chills. He has few episodes of nausea over the last few days and vomiting earlier today but no diarrhea or constipation. He is requesting refill of Compazine and Zofran. He has no weight loss or night sweats. He has been tolerating his treatment with immunotherapy with Nivolumab fairly well status post 25 cycles. He had repeat CT scan of the chest performed recently and he is here for evaluation and discussion  of his scan results and treatment options.  MEDICAL HISTORY: Past Medical History:  Diagnosis Date  . [place]    skin cancer  . Constipation    AFTER ANESTHESIA  . Difficulty sleeping   . History of nonmelanoma skin cancer   . Hypertension 10/30/2016  . Inguinal hernia   . Lung mass    right / HAS HAD RADIATION AND CHEMO FOR LUNG CANCER  . Pneumothorax on right 08/24/2015  . Productive cough    "DUE TO RADIATION"  . Radiation 05/11/15-06/21/15   NSCLCA/right lung  . Smoking 1/2 pack a day or less 09/17/2016  . Spontaneous pneumothorax MANY YRS AGO AND AGAIN 04/04/15   HISTORY OF (ONLY)    ALLERGIES:  is allergic to aleve [naproxen].  MEDICATIONS:  Current Outpatient Prescriptions  Medication Sig Dispense Refill  . acetaminophen (TYLENOL) 325 MG tablet 2 tablets    . albuterol (PROVENTIL HFA) 108 (90 Base) MCG/ACT inhaler 1-2 puffs as needed    . Arginine (RA L-ARGININE) 1000 MG TABS Take 500 mg by mouth daily.     . Ascorbic Acid (VITAMIN C) 1000 MG tablet Take 1,000 mg by mouth daily. Pt takes 2 5024mtabs    . B Complex Vitamins (B COMPLEX PO) Take 1 capsule by mouth daily.     . budesonide-formoterol (SYMBICORT) 160-4.5 MCG/ACT inhaler 2 puffs BID 1 Inhaler 5  . CALCIUM CITRATE PO Take 1 tablet by mouth daily.    . Cyanocobalamin (VITAMIN B 12 PO) Take 1 tablet by mouth daily.    . DUREZOL 0.05 % EMUL Place 1 drop into  the right eye 2 (two) times daily.    . ferrous sulfate 325 (65 FE) MG tablet Take 325 mg by mouth daily with breakfast.    . guaiFENesin (MUCINEX) 600 MG 12 hr tablet Take 1 tablet (600 mg total) by mouth 2 (two) times daily as needed.    Marland Kitchen ketorolac (ACULAR) 0.5 % ophthalmic solution Place 1 drop into the right eye 2 (two) times daily.    . magic mouthwash SOLN Take 5 mLs by mouth 4 (four) times daily as needed.    . magnesium 30 MG tablet Take 30 mg by mouth daily.    . Multiple Vitamin (MULTI VITAMIN DAILY PO) Take 1 tablet by mouth daily.    .  nivolumab (OPDIVO) 100 MG/10ML SOLN chemo injection Inject into the vein.    Marland Kitchen ofloxacin (OCUFLOX) 0.3 % ophthalmic solution Place 1 drop into the right eye 2 (two) times daily.    . Omega-3 Fatty Acids (FISH OIL) 1000 MG CAPS Take 1,000 mg by mouth daily.    . predniSONE (DELTASONE) 20 MG tablet 3 tablets by mouth daily for 1 week, 2 tablets by mouth daily for 1 week, 1 tablet by mouth daily for 1 week and then 0.5 tablet by mouth daily for 1 week until you see Dr. Julien Nordmann. 50 tablet 0  . saccharomyces boulardii (FLORASTOR) 250 MG capsule Take 250 mg by mouth 2 (two) times daily.    . vitamin A 10000 UNIT capsule 1 capsule with food or milk     No current facility-administered medications for this visit.     SURGICAL HISTORY:  Past Surgical History:  Procedure Laterality Date  . CHEST TUBE INSERTION Right 08/02/2015   Procedure: INSERTION OF SECOND RIGHT CHEST TUBE WITH FLUROSCOPY;  Surgeon: Grace Isaac, MD;  Location: [place];  Service: Thoracic;  Laterality: Right;  . CHEST TUBE INSERTION  07/28/2015  . COLONOSCOPY W/ BIOPSIES AND POLYPECTOMY    . HERNIA REPAIR  2011  . INGUINAL HERNIA REPAIR Left 07/15/2015   Procedure: OPEN LEFT INGUINAL HERNIA REPAIR;  Surgeon: Johnathan Hausen, MD;  Location: [facility];  Service: General;  Laterality: Left;  With MESH  . TONSILLECTOMY    . VIDEO BRONCHOSCOPY WITH ENDOBRONCHIAL ULTRASOUND N/A 04/18/2015   Procedure: VIDEO BRONCHOSCOPY WITH ENDOBRONCHIAL ULTRASOUND;  Surgeon: Grace Isaac, MD;  Location: [place];  Service: Thoracic;  Laterality: N/A;  . VIDEO BRONCHOSCOPY WITH INSERTION OF INTERBRONCHIAL VALVE (IBV) N/A 08/02/2015   Procedure: VIDEO BRONCHOSCOPY WITH ATTEMPTED INSERTION OF INTERBRONCHIAL VALVE (IBV) WITH FLUROSCOPY;  Surgeon: Grace Isaac, MD;  Location[facility];  Service: Thoracic;  Laterality: N/A;  . WISDOM TOOTH EXTRACTION      REVIEW OF SYSTEMS:  Constitutional: positive for fatigue Eyes: negative Ears, nose, mouth, throat,  and face: positive for hoarseness Respiratory: positive for cough, dyspnea on exertion and wheezing Cardiovascular: negative Gastrointestinal: negative Genitourinary:negative Integument/breast: negative Hematologic/lymphatic: negative Musculoskeletal:negative Neurological: negative Behavioral/Psych: negative Endocrine: negative Allergic/Immunologic: negative   PHYSICAL EXAMINATION: General appearance: alert, cooperative, fatigued and no distress Head: Normocephalic, without obvious abnormality, atraumatic Neck: no adenopathy, no JVD, supple, symmetrical, trachea midline and thyroid not enlarged, symmetric, no tenderness/mass/nodules Lymph nodes: Cervical, supraclavicular, and axillary nodes normal. Resp: wheezes RUL Back: symmetric, no curvature. ROM normal. No CVA tenderness. Cardio: regular rate and rhythm, S1, S2 normal, no murmur, click, rub or gallop GI: soft, non-tender; bowel sounds normal; no masses,  no organomegaly Extremities: extremities normal, atraumatic, no cyanosis or edema Neurologic: Alert and oriented  X 3, normal strength and tone. Normal symmetric reflexes. Normal coordination and gait  ECOG PERFORMANCE STATUS: 1 - Symptomatic but completely ambulatory  Blood pressure 127/79, pulse (!) 102, temperature 97.6 F (36.4 C), temperature source Oral, resp. rate 18, height 5' 10"  (1.778 m), weight 118 lb 4.8 oz (53.7 kg), SpO2 98 %.  LABORATORY DATA: Lab Results  Component Value Date   WBC 13.0 (H) 02/04/2017   HGB 15.2 02/04/2017   HCT 45.0 02/04/2017   MCV 88.9 02/04/2017   PLT 213 02/04/2017      Chemistry      Component Value Date/Time   NA 142 02/04/2017 1328   K 4.0 02/04/2017 1328   CL 102 09/12/2015 0410   CO2 24 02/04/2017 1328   BUN 26.8 (H) 02/04/2017 1328   CREATININE 1.2 02/04/2017 1328      Component Value Date/Time   CALCIUM 10.2 02/04/2017 1328   ALKPHOS 101 02/04/2017 1328   AST 22 02/04/2017 1328   ALT 25 02/04/2017 1328   BILITOT  0.42 02/04/2017 1328       RADIOGRAPHIC STUDIES: Ct Chest W Contrast  Result Date: 02/01/2017 CLINICAL DATA:  Restaging lung cancer. EXAM: CT CHEST WITH CONTRAST TECHNIQUE: Multidetector CT imaging of the chest was performed during intravenous contrast administration. CONTRAST:  5m ISOVUE-300 IOPAMIDOL (ISOVUE-300) INJECTION 61% COMPARISON:  11/20/2016 FINDINGS: Chest wall: No chest wall masses, supraclavicular or axillary adenopathy. There are few small scattered supraclavicular nodes on the right which have decreased in size since the prior study. The thyroid gland is grossly normal. Cardiovascular: The heart is normal in size. No pericardial effusion. Stable tortuosity and calcification of the thoracic aorta. No dissection. The branch vessels are patent. Stable coronary artery calcifications. Mediastinum/Nodes: No enlarged mediastinal or hilar lymph nodes. Stable soft tissue density surrounding the bronchus intermedius which is probably radiation change. No obvious recurrent tumor. The esophagus is grossly normal. Lungs/Pleura: Stable severe right apical consolidation and bronchiectasis likely severe radiation change. No obvious recurrent tumor. Persistent metastatic pulmonary disease. Most lesions are stable. Some are slightly larger. Irregular left apical pleural density on image number 21 measures 12 mm and is stable. Irregular semi-solid nodular density in the left upper lobe on image number 41 measures 10 mm and previous measured 9 mm. 6.5 mm superior segment left lower lobe nodule on image 61 previously measured 5 mm. 7.5 mm right upper lobe nodule on image 79 previously measured 5 mm. Slight interval enlargement of nodular densities in the anterior aspect of the right middle lobe on image number 108. Large ground-glass nodule in the left lower lobe on image number 132 measures 16 mm and previously measured 14 mm. Multiple pulmonary nodules at the right lung base. Most of these are stable. Upper  Abdomen: No significant upper abdominal findings. Advanced atherosclerotic changes involving the aorta. Stable low-attenuation right adrenal gland lesion consistent with benign adenoma. Musculoskeletal: No findings for osseous metastatic disease. Stable compression deformity of the mid thoracic spine. IMPRESSION: Extensive consolidation, loss of volume and bronchiectasis in the right upper lobe likely due to severe radiation change. Stable soft tissue density in the mediastinum and surrounding the bronchus intermedius is likely treated tumor/radiation change. No progressive findings. Slight progression of pulmonary metastatic nodules.  No new lesions. Stable emphysematous changes and severe pulmonary scarring. Electronically Signed   By: PMarijo SanesM.D.   On: 02/01/2017 19:42    ASSESSMENT AND PLAN: This is a very pleasant 76years old white male with metastatic non-small cell  lung cancer, adenocarcinoma that was initially diagnosed as locally advanced disease in July 2016 status post concurrent chemoradiation with weekly carboplatin and paclitaxel. The patient had evidence for disease progression and he was a started on treatment with Nivolumab 240 mg IV every 2 weeks is status post 25 cycles and has been tolerating this treatment fairly well. He had repeat CT scan of the chest performed recently. I personally and independently reviewed the scan images and discuss the result and showed the images to the patient and his family today. His recent scan showed the persistent extensive consolidation and bronchiectasis in the right upper lobe secondary to radiation changes but no new finding or evidence of progressive disease except for slight increase in some of the pulmonary nodules which are still consider within the margin of error for measurement. I recommended for the patient to continue his current treatment with Nivolumab for now. He will proceed with cycle #26 today. I will see him back for  follow-up visit in 2 weeks for evaluation before the next cycle of his treatment. For the nausea and vomiting, I gave the patient a refill of his nausea medication with Compazine and Zofran. For COPD he will continue on Symbicort as well as albuterol. I also strongly recommended for the patient to quit smoking. He would come back for follow-up visit in 2 weeks for evaluation before starting cycle #27. He was advised to call immediately if he has any concerning symptoms in the interval. The patient voices understanding of current disease status and treatment options and is in agreement with the current care plan. All questions were answered. The patient knows to call the clinic with any problems, questions or concerns. We can certainly see the patient much sooner if necessary. Disclaimer: This note was dictated with voice recognition software. Similar sounding words can inadvertently be transcribed and may not be corrected upon review.

## 2017-02-06 ENCOUNTER — Telehealth: Payer: Self-pay | Admitting: Internal Medicine

## 2017-02-06 NOTE — Telephone Encounter (Signed)
Left message to inform patient of added appts and to pick up new schedule next appt on 4/23

## 2017-02-13 ENCOUNTER — Encounter: Payer: Self-pay | Admitting: Internal Medicine

## 2017-02-13 ENCOUNTER — Ambulatory Visit (INDEPENDENT_AMBULATORY_CARE_PROVIDER_SITE_OTHER): Payer: 59 | Admitting: Internal Medicine

## 2017-02-13 DIAGNOSIS — J449 Chronic obstructive pulmonary disease, unspecified: Secondary | ICD-10-CM | POA: Diagnosis not present

## 2017-02-13 MED ORDER — ACLIDINIUM BROMIDE 400 MCG/ACT IN AEPB: 1.0000 | INHALATION_SPRAY | Freq: Two times a day (BID) | RESPIRATORY_TRACT | 11 refills | Status: DC

## 2017-02-13 NOTE — Patient Instructions (Signed)
Stage 2 moderate COPD by GOLD classification ([place]) Stable lung diseae but out of proprition symptoms  Plan Continue daily symbicort Start incruse or tudorza to see if this helps symptoms further - scheduled (take sample) and script  FOllowup 3 months or sooner if needed

## 2017-02-13 NOTE — Assessment & Plan Note (Signed)
Stable lung diseae but out of proprition symptoms  Plan Continue daily symbicort Start incruse or tudorza to see if this helps symptoms further - scheduled (take sample) and script  FOllowup 3 months or sooner if needed

## 2017-02-13 NOTE — Progress Notes (Signed)
Subjective:     Patient ID: Gabriel [name], male   DOB: 1940/11/24, 76 y.o.   MRN: [id number]  HPI   OV 02/13/2017  Chief Complaint  Patient presents with  . Follow-up    Pt saw SG in Feb 2018 for an acute visit. Pt states he is improved and states his breathing is back to baseline. Pt c/o prod cough with white mucus - pt states this is baseline. Pt denies CP/tightness and f/c/s.    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  He is on immune therapy for his advanced lung cancer. He had CT scan of the chest 02/01/2017 the visualized overall he will fibrosis is significantly improved in the last 2 years. There is some nodules which she believes do not represent cancer and is on continued immunotherapy. At this point in time he feels stable but with good functional status. He is fatigued though he did try pulmonary rehabilitation it did not help. He also has some amount of cough with mild white mucus. Prednisone a few months ago helped him a lot he took that for 4 weeks. He is on Symbicort. He does feel he can be better with the symptoms. He is not interested in chronic daily prednisone      has a past medical history of Cancer ([place]); Constipation; Difficulty sleeping; History of nonmelanoma skin cancer; Hypertension (10/30/2016); Inguinal hernia; Lung mass; Pneumothorax on right (08/24/2015); Productive cough; Radiation (05/11/15-06/21/15); Smoking 1/2 pack a day or less (09/17/2016); and Spontaneous pneumothorax (MANY YRS AGO AND AGAIN 04/04/15).   reports that he has been smoking Cigarettes.  He has a 13.75 pack-year smoking history. He has never used smokeless tobacco.  Past Surgical History:  Procedure Laterality Date  . CHEST TUBE INSERTION Right 08/02/2015   Procedure: INSERTION OF SECOND RIGHT CHEST TUBE WITH FLUROSCOPY;  Surgeon: Grace Isaac, MD;  Location: [place];  Service: Thoracic;   Laterality: Right;  . CHEST TUBE INSERTION  07/28/2015  . COLONOSCOPY W/ BIOPSIES AND POLYPECTOMY    . HERNIA REPAIR  2011  . INGUINAL HERNIA REPAIR Left 07/15/2015   Procedure: OPEN LEFT INGUINAL HERNIA REPAIR;  Surgeon: Johnathan Hausen, MD;  [facility];  Service: General;  Laterality: Left;  With MESH  . TONSILLECTOMY    . VIDEO BRONCHOSCOPY WITH ENDOBRONCHIAL ULTRASOUND N/A 04/18/2015   Procedure: VIDEO BRONCHOSCOPY WITH ENDOBRONCHIAL ULTRASOUND;  Surgeon: Grace Isaac, MD;  Location: [place];  Service: Thoracic;  Laterality: N/A;  . VIDEO BRONCHOSCOPY WITH INSERTION OF INTERBRONCHIAL VALVE (IBV) N/A 08/02/2015   Procedure: VIDEO BRONCHOSCOPY WITH ATTEMPTED INSERTION OF INTERBRONCHIAL VALVE (IBV) WITH FLUROSCOPY;  Surgeon: Grace Isaac, MD;  [facility];  Service: Thoracic;  Laterality: N/A;  . WISDOM TOOTH EXTRACTION      Allergies  Allergen Reactions  . Aleve [Naproxen] Rash    Immunization History  Administered Date(s) Administered  . Influenza,inj,Quad PF,36+ Mos 09/17/2016  . Pneumococcal Conjugate-13 02/14/2016    Family History  Problem Relation Age of Onset  . Heart attack Father   . Heart disease Father   . Hypertension Mother      Current Outpatient Prescriptions:  .  albuterol (PROVENTIL HFA) 108 (90 Base) MCG/ACT inhaler, 1-2 puffs as needed, Disp: , Rfl:  .  Arginine (RA L-ARGININE) 1000 MG TABS, Take 500 mg by mouth daily. , Disp: , Rfl:  .  Ascorbic Acid (VITAMIN C) 1000  MG tablet, Take 1,000 mg by mouth daily. Pt takes 2 '500mg'$  tabs, Disp: , Rfl:  .  B Complex Vitamins (B COMPLEX PO), Take 1 capsule by mouth daily. , Disp: , Rfl:  .  budesonide-formoterol (SYMBICORT) 160-4.5 MCG/ACT inhaler, 2 puffs BID, Disp: 1 Inhaler, Rfl: 5 .  CALCIUM CITRATE PO, Take 1 tablet by mouth daily., Disp: , Rfl:  .  Cyanocobalamin (VITAMIN B 12 PO), Take 1 tablet by mouth daily., Disp: , Rfl:  .  ferrous sulfate 325 (65 FE) MG tablet, Take 325 mg by mouth daily  with breakfast., Disp: , Rfl:  .  guaiFENesin (MUCINEX) 600 MG 12 hr tablet, Take 1 tablet (600 mg total) by mouth 2 (two) times daily as needed., Disp: , Rfl:  .  ketorolac (ACULAR) 0.5 % ophthalmic solution, Place 1 drop into the right eye 2 (two) times daily., Disp: , Rfl:  .  magic mouthwash SOLN, Take 5 mLs by mouth 4 (four) times daily as needed., Disp: , Rfl:  .  magnesium 30 MG tablet, Take 30 mg by mouth daily., Disp: , Rfl:  .  Multiple Vitamin (MULTI VITAMIN DAILY PO), Take 1 tablet by mouth daily., Disp: , Rfl:  .  Omega-3 Fatty Acids (FISH OIL) 1000 MG CAPS, Take 1,000 mg by mouth daily., Disp: , Rfl:  .  ondansetron (ZOFRAN) 8 MG tablet, Take 1 tablet (8 mg total) by mouth every 8 (eight) hours as needed for nausea or vomiting., Disp: 20 tablet, Rfl: 0 .  prochlorperazine (COMPAZINE) 10 MG tablet, Take 1 tablet (10 mg total) by mouth every 6 (six) hours as needed for nausea or vomiting., Disp: 30 tablet, Rfl: 0 .  saccharomyces boulardii (FLORASTOR) 250 MG capsule, Take 250 mg by mouth 2 (two) times daily., Disp: , Rfl:  .  vitamin A 10000 UNIT capsule, 1 capsule with food or milk, Disp: , Rfl:  .  acetaminophen (TYLENOL) 325 MG tablet, 2 tablets, Disp: , Rfl:  .  DUREZOL 0.05 % EMUL, Place 1 drop into the right eye 2 (two) times daily., Disp: , Rfl:      Review of Systems     Objective:   Physical Exam  Constitutional: He is oriented to person, place, and time. He appears well-developed and well-nourished. No distress.  HENT:  Head: Normocephalic and atraumatic.  Right Ear: External ear normal.  Left Ear: External ear normal.  Mouth/Throat: Oropharynx is clear and moist. No oropharyngeal exudate.  Eyes: Conjunctivae and EOM are normal. Pupils are equal, round, and reactive to light. Right eye exhibits no discharge. Left eye exhibits no discharge. No scleral icterus.  Neck: Normal range of motion. Neck supple. No JVD present. No tracheal deviation present. No thyromegaly  present.  Cardiovascular: Normal rate, regular rhythm and intact distal pulses.  Exam reveals no gallop and no friction rub.   No murmur heard. Pulmonary/Chest: Effort normal and breath sounds normal. No respiratory distress. He has no wheezes. He has no rales. He exhibits no tenderness.  Rt shoulder droop Noisy breath sounds Overall stable  Abdominal: Soft. Bowel sounds are normal. He exhibits no distension and no mass. There is no tenderness. There is no rebound and no guarding.  Musculoskeletal: Normal range of motion. He exhibits no edema or tenderness.  Clubbing +  Lymphadenopathy:    He has no cervical adenopathy.  Neurological: He is alert and oriented to person, place, and time. He has normal reflexes. No cranial nerve deficit. Coordination normal.  Skin: Skin  is warm and dry. No rash noted. He is not diaphoretic. No erythema. No pallor.  Psychiatric: He has a normal mood and affect. His behavior is normal. Judgment and thought content normal.  Nursing note and vitals reviewed.  Vitals:   02/13/17 0936  BP: (!) 142/80  Pulse: 87  SpO2: 97%  Weight: 119 lb 9.6 oz (54.3 kg)  Height: '5\' 10"'$  (1.778 m)    Estimated body mass index is 17.16 kg/m as calculated from the following:   Height as of this encounter: '5\' 10"'$  (1.778 m).   Weight as of this encounter: 119 lb 9.6 oz (54.3 kg).     Assessment:       ICD-9-CM ICD-10-CM   1. Stage 2 moderate COPD by GOLD classification ([place]) 496 J44.9        Plan:     Stage 2 moderate COPD by GOLD classification ([place]) Stable lung diseae but out of proprition symptoms  Plan Continue daily symbicort Start incruse or tudorza to see if this helps symptoms further - scheduled (take sample) and script  FOllowup 3 months or sooner if needed   Dr. Brand Males, M.D., [facility].C.P Pulmonary and Critical Care Medicine Staff Physician [place] [facility] Pager: [phone], If no answer or between   15:00h - 7:00h: call [phone]  02/13/2017 9:50 AM

## 2017-02-18 ENCOUNTER — Other Ambulatory Visit: Payer: Self-pay | Admitting: Medical Oncology

## 2017-02-18 ENCOUNTER — Encounter: Payer: Self-pay | Admitting: Internal Medicine

## 2017-02-18 ENCOUNTER — Ambulatory Visit (HOSPITAL_BASED_OUTPATIENT_CLINIC_OR_DEPARTMENT_OTHER): Payer: 59

## 2017-02-18 ENCOUNTER — Other Ambulatory Visit (HOSPITAL_BASED_OUTPATIENT_CLINIC_OR_DEPARTMENT_OTHER): Payer: 59

## 2017-02-18 ENCOUNTER — Ambulatory Visit (HOSPITAL_BASED_OUTPATIENT_CLINIC_OR_DEPARTMENT_OTHER): Payer: 59 | Admitting: Internal Medicine

## 2017-02-18 VITALS — BP 143/70 | HR 88 | Temp 97.7°F | Resp 19 | Ht 70.0 in | Wt 117.1 lb

## 2017-02-18 DIAGNOSIS — R112 Nausea with vomiting, unspecified: Secondary | ICD-10-CM | POA: Diagnosis not present

## 2017-02-18 DIAGNOSIS — Z5112 Encounter for antineoplastic immunotherapy: Secondary | ICD-10-CM

## 2017-02-18 DIAGNOSIS — R5383 Other fatigue: Secondary | ICD-10-CM

## 2017-02-18 DIAGNOSIS — J449 Chronic obstructive pulmonary disease, unspecified: Secondary | ICD-10-CM | POA: Diagnosis not present

## 2017-02-18 DIAGNOSIS — C3491 Malignant neoplasm of unspecified part of right bronchus or lung: Secondary | ICD-10-CM | POA: Diagnosis not present

## 2017-02-18 DIAGNOSIS — J441 Chronic obstructive pulmonary disease with (acute) exacerbation: Secondary | ICD-10-CM

## 2017-02-18 DIAGNOSIS — C3411 Malignant neoplasm of upper lobe, right bronchus or lung: Secondary | ICD-10-CM

## 2017-02-18 LAB — CBC WITH DIFFERENTIAL/PLATELET
BASO%: 0.9 % (ref 0.0–2.0)
Basophils Absolute: 0.1 10*3/uL (ref 0.0–0.1)
EOS%: 3.1 % (ref 0.0–7.0)
Eosinophils Absolute: 0.3 10*3/uL (ref 0.0–0.5)
HCT: 41.9 % (ref 38.4–49.9)
HGB: 13.9 g/dL (ref 13.0–17.1)
LYMPH%: 8.7 % — AB (ref 14.0–49.0)
MCH: 30.1 pg (ref 27.2–33.4)
MCHC: 33.2 g/dL (ref 32.0–36.0)
MCV: 90.6 fL (ref 79.3–98.0)
MONO#: 0.5 10*3/uL (ref 0.1–0.9)
MONO%: 5.9 % (ref 0.0–14.0)
NEUT%: 81.4 % — ABNORMAL HIGH (ref 39.0–75.0)
NEUTROS ABS: 6.9 10*3/uL — AB (ref 1.5–6.5)
Platelets: 245 10*3/uL (ref 140–400)
RBC: 4.62 10*6/uL (ref 4.20–5.82)
RDW: 15.5 % — ABNORMAL HIGH (ref 11.0–14.6)
WBC: 8.5 10*3/uL (ref 4.0–10.3)
lymph#: 0.7 10*3/uL — ABNORMAL LOW (ref 0.9–3.3)

## 2017-02-18 LAB — COMPREHENSIVE METABOLIC PANEL
ALT: 21 U/L (ref 0–55)
ANION GAP: 12 meq/L — AB (ref 3–11)
AST: 19 U/L (ref 5–34)
Albumin: 3.9 g/dL (ref 3.5–5.0)
Alkaline Phosphatase: 98 U/L (ref 40–150)
BILIRUBIN TOTAL: 0.31 mg/dL (ref 0.20–1.20)
BUN: 28.5 mg/dL — AB (ref 7.0–26.0)
CHLORIDE: 106 meq/L (ref 98–109)
CO2: 24 meq/L (ref 22–29)
CREATININE: 1 mg/dL (ref 0.7–1.3)
Calcium: 10 mg/dL (ref 8.4–10.4)
EGFR: 75 mL/min/{1.73_m2} — ABNORMAL LOW (ref >90)
Glucose: 106 mg/dl (ref 70–140)
Potassium: 4.1 mEq/L (ref 3.5–5.1)
SODIUM: 142 meq/L (ref 136–145)
TOTAL PROTEIN: 7.4 g/dL (ref 6.4–8.3)

## 2017-02-18 MED ORDER — SODIUM CHLORIDE 0.9 % IV SOLN
Freq: Once | INTRAVENOUS | Status: AC
  Administered 2017-02-18: 15:00:00 via INTRAVENOUS

## 2017-02-18 MED ORDER — SODIUM CHLORIDE 0.9 % IV SOLN
240.0000 mg | Freq: Once | INTRAVENOUS | Status: AC
  Administered 2017-02-18: 240 mg via INTRAVENOUS
  Filled 2017-02-18: qty 24

## 2017-02-18 MED ORDER — METHYLPHENIDATE HCL 5 MG PO TABS: 5.0000 mg | ORAL_TABLET | Freq: Two times a day (BID) | ORAL | 0 refills | Status: DC

## 2017-02-18 NOTE — Patient Instructions (Signed)
Steps to Quit Smoking Smoking tobacco can be bad for your health. It can also affect almost every organ in your body. Smoking puts you and people around you at risk for many serious long-lasting (chronic) diseases. Quitting smoking is hard, but it is one of the best things that you can do for your health. It is never too late to quit. What are the benefits of quitting smoking? When you quit smoking, you lower your risk for getting serious diseases and conditions. They can include:  Lung cancer or lung disease.  Heart disease.  Stroke.  Heart attack.  Not being able to have children (infertility).  Weak bones (osteoporosis) and broken bones (fractures). If you have coughing, wheezing, and shortness of breath, those symptoms may get better when you quit. You may also get sick less often. If you are pregnant, quitting smoking can help to lower your chances of having a baby of low birth weight. What can I do to help me quit smoking? Talk with your doctor about what can help you quit smoking. Some things you can do (strategies) include:  Quitting smoking totally, instead of slowly cutting back how much you smoke over a period of time.  Going to in-person counseling. You are more likely to quit if you go to many counseling sessions.  Using resources and support systems, such as:  Online chats with a counselor.  Phone quitlines.  Printed self-help materials.  Support groups or group counseling.  Text messaging programs.  Mobile phone apps or applications.  Taking medicines. Some of these medicines may have nicotine in them. If you are pregnant or breastfeeding, do not take any medicines to quit smoking unless your doctor says it is okay. Talk with your doctor about counseling or other things that can help you. Talk with your doctor about using more than one strategy at the same time, such as taking medicines while you are also going to in-person counseling. This can help make quitting  easier. What things can I do to make it easier to quit? Quitting smoking might feel very hard at first, but there is a lot that you can do to make it easier. Take these steps:  Talk to your family and friends. Ask them to support and encourage you.  Call phone quitlines, reach out to support groups, or work with a counselor.  Ask people who smoke to not smoke around you.  Avoid places that make you want (trigger) to smoke, such as:  Bars.  Parties.  Smoke-break areas at work.  Spend time with people who do not smoke.  Lower the stress in your life. Stress can make you want to smoke. Try these things to help your stress:  Getting regular exercise.  Deep-breathing exercises.  Yoga.  Meditating.  Doing a body scan. To do this, close your eyes, focus on one area of your body at a time from head to toe, and notice which parts of your body are tense. Try to relax the muscles in those areas.  Download or buy apps on your mobile phone or tablet that can help you stick to your quit plan. There are many free apps, such as QuitGuide from the CDC (Centers for Disease Control and Prevention). You can find more support from smokefree.gov and other websites. This information is not intended to replace advice given to you by your health care provider. Make sure you discuss any questions you have with your health care provider. Document Released: 08/11/2009 Document Revised: 06/12/2016 Document   Reviewed: 03/01/2015 Elsevier Interactive Patient Education  2017 Elsevier Inc.  

## 2017-02-18 NOTE — Patient Instructions (Signed)
[facility]cer Center Discharge Instructions for Patients Receiving Chemotherapy  Today you received the following chemotherapy agents:  Nivolumab.  To help prevent nausea and vomiting after your treatment, we encourage you to take your nausea medication as directed.   If you develop nausea and vomiting that is not controlled by your nausea medication, call the clinic.   BELOW ARE SYMPTOMS THAT SHOULD BE REPORTED IMMEDIATELY:  *FEVER GREATER THAN 100.5 F  *CHILLS WITH OR WITHOUT FEVER  NAUSEA AND VOMITING THAT IS NOT CONTROLLED WITH YOUR NAUSEA MEDICATION  *UNUSUAL SHORTNESS OF BREATH  *UNUSUAL BRUISING OR BLEEDING  TENDERNESS IN MOUTH AND THROAT WITH OR WITHOUT PRESENCE OF ULCERS  *URINARY PROBLEMS  *BOWEL PROBLEMS  UNUSUAL RASH Items with * indicate a potential emergency and should be followed up as soon as possible.  Feel free to call the clinic you have any questions or concerns. The clinic phone number is [phone].  Please show the CHEMO ALERT CARD at check-in to the Emergency Department and triage nurse.   

## 2017-02-18 NOTE — Progress Notes (Signed)
[place] [phone]   [phone]  OFFICE PROGRESS NOTE  Cammy Copa, MD [phone] [address] [place] [address]  DIAGNOSIS: Progressive non-small cell lung cancer, adenocarcinoma initially diagnosed as stage IIIa (T2a, N2, M0) in July 2016.  Foundation One molecular studies reveal that he was positive for [place], [place], and STAG2 S1073f*20. He was negative for: EGFR, ALK, BRAF, MET, RET, ERBB2 and ROS1  PRIOR THERAPY: 1) Concurrent chemoradiation with chemotherapy in the form of weekly carboplatin for AUC 2 paclitaxel 45 mg/m given concurrent with radiation therapy. First cycle started 05/09/2015. Status post 7 cycles. 2) Consolidation chemotherapy with carboplatin for AUC of 5 on day 1 and gemcitabine 1000 MG/M2 on days 1 and 8 every 3 weeks. First dose 08/15/2015. Discontinued secondary to intolerance.  CURRENT THERAPY: Immunotherapy with Nivolumab 240 mg IV every 2 weeks, status post 26 cycles.  INTERVAL HISTORY: Gabriel ALCOCK76y.o. male returns to the clinic today for follow-up visit accompanied by his daughter, Gabriel [name] The patient is feeling fine today with no specific complaints. He is tolerating his current treatment with immunotherapy with Nivolumab fairly well except for fatigue. He denied having any chest pain but continues to have shortness of breath with exertion with mild cough and no hemoptysis. He has no fever or chills. He denied having any significant weight loss or night sweats. He has no headache or visual changes. He does today for evaluation before starting cycle #27.   MEDICAL HISTORY: Past Medical History:  Diagnosis Date  . Cancer ([facility]    skin cancer  . Constipation    AFTER ANESTHESIA  . Difficulty sleeping   . History of nonmelanoma skin cancer   . Hypertension 10/30/2016  . Inguinal hernia   . Lung mass    right / HAS HAD RADIATION AND CHEMO FOR LUNG CANCER  .  Pneumothorax on right 08/24/2015  . Productive cough    "DUE TO RADIATION"  . Radiation 05/11/15-06/21/15   NSCLCA/right lung  . Smoking 1/2 pack a day or less 09/17/2016  . Spontaneous pneumothorax MANY YRS AGO AND AGAIN 04/04/15   HISTORY OF (ONLY)    ALLERGIES:  is allergic to aleve [naproxen].  MEDICATIONS:  Current Outpatient Prescriptions  Medication Sig Dispense Refill  . acetaminophen (TYLENOL) 325 MG tablet 2 tablets    . Aclidinium Bromide (TUDORZA PRESSAIR) 400 MCG/ACT AEPB Inhale 1 puff into the lungs 2 (two) times daily. 30 each 11  . albuterol (PROVENTIL HFA) 108 (90 Base) MCG/ACT inhaler 1-2 puffs as needed    . Arginine (RA L-ARGININE) 1000 MG TABS Take 500 mg by mouth daily.     . Ascorbic Acid (VITAMIN C) 1000 MG tablet Take 1,000 mg by mouth daily. Pt takes 2 5083mtabs    . B Complex Vitamins (B COMPLEX PO) Take 1 capsule by mouth daily.     . budesonide-formoterol (SYMBICORT) 160-4.5 MCG/ACT inhaler 2 puffs BID 1 Inhaler 5  . CALCIUM CITRATE PO Take 1 tablet by mouth daily.    . Cyanocobalamin (VITAMIN B 12 PO) Take 1 tablet by mouth daily.    . DUREZOL 0.05 % EMUL Place 1 drop into the right eye 2 (two) times daily.    . ferrous sulfate 325 (65 FE) MG tablet Take 325 mg by mouth daily with breakfast.    . guaiFENesin (MUCINEX) 600 MG 12 hr tablet Take 1 tablet (600 mg total) by mouth 2 (two)  times daily as needed.    Marland Kitchen ketorolac (ACULAR) 0.4 % SOLN     . ketorolac (ACULAR) 0.5 % ophthalmic solution Place 1 drop into the right eye 2 (two) times daily.    . magic mouthwash SOLN Take 5 mLs by mouth 4 (four) times daily as needed.    . magnesium 30 MG tablet Take 30 mg by mouth daily.    . Multiple Vitamin (MULTI VITAMIN DAILY PO) Take 1 tablet by mouth daily.    . Omega-3 Fatty Acids (FISH OIL) 1000 MG CAPS Take 1,000 mg by mouth daily.    . ondansetron (ZOFRAN) 8 MG tablet Take 1 tablet (8 mg total) by mouth every 8 (eight) hours as needed for nausea or vomiting. 20  tablet 0  . prochlorperazine (COMPAZINE) 10 MG tablet Take 1 tablet (10 mg total) by mouth every 6 (six) hours as needed for nausea or vomiting. 30 tablet 0  . saccharomyces boulardii (FLORASTOR) 250 MG capsule Take 250 mg by mouth 2 (two) times daily.    . vitamin A 10000 UNIT capsule 1 capsule with food or milk     No current facility-administered medications for this visit.     SURGICAL HISTORY:  Past Surgical History:  Procedure Laterality Date  . CHEST TUBE INSERTION Right 08/02/2015   Procedure: INSERTION OF SECOND RIGHT CHEST TUBE WITH FLUROSCOPY;  Surgeon: Grace Isaac, MD;  Location: [facility];  Service: Thoracic;  Laterality: Right;  . CHEST TUBE INSERTION  07/28/2015  . COLONOSCOPY W/ BIOPSIES AND POLYPECTOMY    . HERNIA REPAIR  2011  . INGUINAL HERNIA REPAIR Left 07/15/2015   Procedure: OPEN LEFT INGUINAL HERNIA REPAIR;  Surgeon: Johnathan Hausen, MD;  Location: [facility];  Service: General;  Laterality: Left;  With MESH  . TONSILLECTOMY    . VIDEO BRONCHOSCOPY WITH ENDOBRONCHIAL ULTRASOUND N/A 04/18/2015   Procedure: VIDEO BRONCHOSCOPY WITH ENDOBRONCHIAL ULTRASOUND;  Surgeon: Grace Isaac, MD;  Location: [facility];  Service: Thoracic;  Laterality: N/A;  . VIDEO BRONCHOSCOPY WITH INSERTION OF INTERBRONCHIAL VALVE (IBV) N/A 08/02/2015   Procedure: VIDEO BRONCHOSCOPY WITH ATTEMPTED INSERTION OF INTERBRONCHIAL VALVE (IBV) WITH FLUROSCOPY;  Surgeon: Grace Isaac, MD;  Location[facility];  Service: Thoracic;  Laterality: N/A;  . WISDOM TOOTH EXTRACTION      REVIEW OF SYSTEMS:  Constitutional: positive for fatigue Eyes: negative Ears, nose, mouth, throat, and face: negative Respiratory: positive for cough and dyspnea on exertion Cardiovascular: negative Gastrointestinal: negative Genitourinary:negative Integument/breast: negative Hematologic/lymphatic: negative Musculoskeletal:negative Neurological: negative Behavioral/Psych: negative Endocrine: negative Allergic/Immunologic:  negative   PHYSICAL EXAMINATION: General appearance: alert, cooperative, fatigued and no distress Head: Normocephalic, without obvious abnormality, atraumatic Neck: no adenopathy, no JVD, supple, symmetrical, trachea midline and thyroid not enlarged, symmetric, no tenderness/mass/nodules Lymph nodes: Cervical, supraclavicular, and axillary nodes normal. Resp: wheezes RUL Back: symmetric, no curvature. ROM normal. No CVA tenderness. Cardio: regular rate and rhythm, S1, S2 normal, no murmur, click, rub or gallop GI: soft, non-tender; bowel sounds normal; no masses,  no organomegaly Extremities: extremities normal, atraumatic, no cyanosis or edema Neurologic: Alert and oriented X 3, normal strength and tone. Normal symmetric reflexes. Normal coordination and gait  ECOG PERFORMANCE STATUS: 1 - Symptomatic but completely ambulatory  Blood pressure (!) 143/70, pulse 88, temperature 97.7 F (36.5 C), temperature source Oral, resp. rate 19, height 5' 10"  (1.778 m), weight 117 lb 1.6 oz (53.1 kg), SpO2 99 %.  LABORATORY DATA: Lab Results  Component Value Date   WBC 8.5  02/18/2017   HGB 13.9 02/18/2017   HCT 41.9 02/18/2017   MCV 90.6 02/18/2017   PLT 245 02/18/2017      Chemistry      Component Value Date/Time   NA 142 02/04/2017 1328   K 4.0 02/04/2017 1328   CL 102 09/12/2015 0410   CO2 24 02/04/2017 1328   BUN 26.8 (H) 02/04/2017 1328   CREATININE 1.2 02/04/2017 1328      Component Value Date/Time   CALCIUM 10.2 02/04/2017 1328   ALKPHOS 101 02/04/2017 1328   AST 22 02/04/2017 1328   ALT 25 02/04/2017 1328   BILITOT 0.42 02/04/2017 1328       RADIOGRAPHIC STUDIES: Ct Chest W Contrast  Result Date: 02/01/2017 CLINICAL DATA:  Restaging lung cancer. EXAM: CT CHEST WITH CONTRAST TECHNIQUE: Multidetector CT imaging of the chest was performed during intravenous contrast administration. CONTRAST:  77m ISOVUE-300 IOPAMIDOL (ISOVUE-300) INJECTION 61% COMPARISON:  11/20/2016  FINDINGS: Chest wall: No chest wall masses, supraclavicular or axillary adenopathy. There are few small scattered supraclavicular nodes on the right which have decreased in size since the prior study. The thyroid gland is grossly normal. Cardiovascular: The heart is normal in size. No pericardial effusion. Stable tortuosity and calcification of the thoracic aorta. No dissection. The branch vessels are patent. Stable coronary artery calcifications. Mediastinum/Nodes: No enlarged mediastinal or hilar lymph nodes. Stable soft tissue density surrounding the bronchus intermedius which is probably radiation change. No obvious recurrent tumor. The esophagus is grossly normal. Lungs/Pleura: Stable severe right apical consolidation and bronchiectasis likely severe radiation change. No obvious recurrent tumor. Persistent metastatic pulmonary disease. Most lesions are stable. Some are slightly larger. Irregular left apical pleural density on image number 21 measures 12 mm and is stable. Irregular semi-solid nodular density in the left upper lobe on image number 41 measures 10 mm and previous measured 9 mm. 6.5 mm superior segment left lower lobe nodule on image 61 previously measured 5 mm. 7.5 mm right upper lobe nodule on image 79 previously measured 5 mm. Slight interval enlargement of nodular densities in the anterior aspect of the right middle lobe on image number 108. Large ground-glass nodule in the left lower lobe on image number 132 measures 16 mm and previously measured 14 mm. Multiple pulmonary nodules at the right lung base. Most of these are stable. Upper Abdomen: No significant upper abdominal findings. Advanced atherosclerotic changes involving the aorta. Stable low-attenuation right adrenal gland lesion consistent with benign adenoma. Musculoskeletal: No findings for osseous metastatic disease. Stable compression deformity of the mid thoracic spine. IMPRESSION: Extensive consolidation, loss of volume and  bronchiectasis in the right upper lobe likely due to severe radiation change. Stable soft tissue density in the mediastinum and surrounding the bronchus intermedius is likely treated tumor/radiation change. No progressive findings. Slight progression of pulmonary metastatic nodules.  No new lesions. Stable emphysematous changes and severe pulmonary scarring. Electronically Signed   By: PMarijo SanesM.D.   On: 02/01/2017 19:42    ASSESSMENT AND PLAN: This is a very pleasant 76years old white male with metastatic non-small cell lung cancer, adenocarcinoma that was initially diagnosed as locally advanced disease in July 2016 status post concurrent chemoradiation with weekly carboplatin and paclitaxel. The patient is currently on treatment with immunotherapy with Nivolumab status post 26 cycles and has been tolerating the treatment well except for fatigue. I recommended for the patient to proceed with cycle #27 today as scheduled. For the fatigue, I will start the patient on Ritalin  5 mg by mouth twice a day as needed for fatigue. For COPD, I will continue his current treatment with Symbicort and albuterol. For the nausea and vomiting this is much improved with the current treatment with Zofran that he has occasional diarrhea with this treatment and he is using Pepto-Bismol. I discussed with the patient again in detail his current condition and the acceptance of mild disease progression that was seen recently on the scan. And he is in agreement with the current plan. I'll see him back for follow-up visit in 2 weeks for evaluation before the next cycle of his treatment. He was advised to call immediately if she has any concerning symptoms in the interval. The patient voices understanding of current disease status and treatment options and is in agreement with the current care plan. All questions were answered. The patient knows to call the clinic with any problems, questions or concerns. We can certainly  see the patient much sooner if necessary. Disclaimer: This note was dictated with voice recognition software. Similar sounding words can inadvertently be transcribed and may not be corrected upon review.

## 2017-02-19 ENCOUNTER — Telehealth: Payer: Self-pay | Admitting: Internal Medicine

## 2017-02-19 NOTE — Telephone Encounter (Signed)
Scheduled appts per 4/23 los - patient to get new schedule next visit

## 2017-03-04 ENCOUNTER — Ambulatory Visit (HOSPITAL_BASED_OUTPATIENT_CLINIC_OR_DEPARTMENT_OTHER): Payer: 59 | Admitting: Internal Medicine

## 2017-03-04 ENCOUNTER — Other Ambulatory Visit (HOSPITAL_BASED_OUTPATIENT_CLINIC_OR_DEPARTMENT_OTHER): Payer: 59

## 2017-03-04 ENCOUNTER — Ambulatory Visit (HOSPITAL_BASED_OUTPATIENT_CLINIC_OR_DEPARTMENT_OTHER): Payer: 59

## 2017-03-04 ENCOUNTER — Encounter: Payer: Self-pay | Admitting: Internal Medicine

## 2017-03-04 VITALS — BP 154/77 | HR 72 | Temp 97.8°F | Resp 18 | Ht 70.0 in | Wt 117.3 lb

## 2017-03-04 DIAGNOSIS — C3491 Malignant neoplasm of unspecified part of right bronchus or lung: Secondary | ICD-10-CM

## 2017-03-04 DIAGNOSIS — Z5112 Encounter for antineoplastic immunotherapy: Secondary | ICD-10-CM

## 2017-03-04 DIAGNOSIS — R05 Cough: Secondary | ICD-10-CM

## 2017-03-04 DIAGNOSIS — E441 Mild protein-calorie malnutrition: Secondary | ICD-10-CM

## 2017-03-04 DIAGNOSIS — Z720 Tobacco use: Secondary | ICD-10-CM | POA: Diagnosis not present

## 2017-03-04 DIAGNOSIS — C3411 Malignant neoplasm of upper lobe, right bronchus or lung: Secondary | ICD-10-CM

## 2017-03-04 LAB — CBC WITH DIFFERENTIAL/PLATELET
BASO%: 0.8 % (ref 0.0–2.0)
Basophils Absolute: 0.1 10*3/uL (ref 0.0–0.1)
EOS ABS: 0.2 10*3/uL (ref 0.0–0.5)
EOS%: 1.8 % (ref 0.0–7.0)
HCT: 43.2 % (ref 38.4–49.9)
HEMOGLOBIN: 14.3 g/dL (ref 13.0–17.1)
LYMPH%: 8.7 % — ABNORMAL LOW (ref 14.0–49.0)
MCH: 30.2 pg (ref 27.2–33.4)
MCHC: 33 g/dL (ref 32.0–36.0)
MCV: 91.3 fL (ref 79.3–98.0)
MONO#: 0.5 10*3/uL (ref 0.1–0.9)
MONO%: 5.9 % (ref 0.0–14.0)
NEUT%: 82.8 % — ABNORMAL HIGH (ref 39.0–75.0)
NEUTROS ABS: 7.6 10*3/uL — AB (ref 1.5–6.5)
PLATELETS: 223 10*3/uL (ref 140–400)
RBC: 4.74 10*6/uL (ref 4.20–5.82)
RDW: 15.1 % — AB (ref 11.0–14.6)
WBC: 9.2 10*3/uL (ref 4.0–10.3)
lymph#: 0.8 10*3/uL — ABNORMAL LOW (ref 0.9–3.3)

## 2017-03-04 LAB — COMPREHENSIVE METABOLIC PANEL
ALT: 23 U/L (ref 0–55)
ANION GAP: 9 meq/L (ref 3–11)
AST: 20 U/L (ref 5–34)
Albumin: 4.1 g/dL (ref 3.5–5.0)
Alkaline Phosphatase: 108 U/L (ref 40–150)
BILIRUBIN TOTAL: 0.37 mg/dL (ref 0.20–1.20)
BUN: 29.5 mg/dL — ABNORMAL HIGH (ref 7.0–26.0)
CALCIUM: 10.1 mg/dL (ref 8.4–10.4)
CO2: 26 mEq/L (ref 22–29)
CREATININE: 1 mg/dL (ref 0.7–1.3)
Chloride: 105 mEq/L (ref 98–109)
EGFR: 71 mL/min/{1.73_m2} — ABNORMAL LOW (ref >90)
Glucose: 76 mg/dl (ref 70–140)
Potassium: 4.3 mEq/L (ref 3.5–5.1)
SODIUM: 140 meq/L (ref 136–145)
TOTAL PROTEIN: 7.5 g/dL (ref 6.4–8.3)

## 2017-03-04 MED ORDER — SODIUM CHLORIDE 0.9 % IV SOLN
Freq: Once | INTRAVENOUS | Status: AC
  Administered 2017-03-04: 14:00:00 via INTRAVENOUS

## 2017-03-04 MED ORDER — SODIUM CHLORIDE 0.9 % IV SOLN
240.0000 mg | Freq: Once | INTRAVENOUS | Status: AC
  Administered 2017-03-04: 240 mg via INTRAVENOUS
  Filled 2017-03-04: qty 24

## 2017-03-04 NOTE — Progress Notes (Signed)
[facility] Telephone:([phone]   [phone]) [phone]  OFFICE PROGRESS NOTE  Aura Dials, MD [phone] [address] [place] [address]  DIAGNOSIS: Progressive non-small cell lung cancer, adenocarcinoma initially diagnosed as stage IIIa (T2a, N2, M0) in July 2016.  Foundation One molecular studies reveal that he was positive for [place], [place], and STAG2 S1068f*20. He was negative for: EGFR, ALK, BRAF, MET, RET, ERBB2 and ROS1  PRIOR THERAPY: 1) Concurrent chemoradiation with chemotherapy in the form of weekly carboplatin for AUC 2 paclitaxel 45 mg/m given concurrent with radiation therapy. First cycle started 05/09/2015. Status post 7 cycles. 2) Consolidation chemotherapy with carboplatin for AUC of 5 on day 1 and gemcitabine 1000 MG/M2 on days 1 and 8 every 3 weeks. First dose 08/15/2015. Discontinued secondary to intolerance.  CURRENT THERAPY: Immunotherapy with Nivolumab 240 mg IV every 2 weeks, status post 27 cycles.  INTERVAL HISTORY: Gabriel ATTAR76y.o. male returns to the clinic today for follow-up visit. The patient is feeling fine today with no specific complaints except for the persistent dry cough. He continues to smoke 0.5 pack per day. He denied having any chest pain but has shortness of breath with exertion with no hemoptysis. He has no fever or chills. He has no nausea, vomiting, diarrhea or constipation. He denied having any recent weight loss or night sweats. He is here for evaluation before starting cycle #28 of his treatment.   MEDICAL HISTORY: Past Medical History:  Diagnosis Date  . Cancer ([facility]    skin cancer  . Constipation    AFTER ANESTHESIA  . Difficulty sleeping   . History of nonmelanoma skin cancer   . Hypertension 10/30/2016  . Inguinal hernia   . Lung mass    right / HAS HAD RADIATION AND CHEMO FOR LUNG CANCER  . Pneumothorax on right 08/24/2015  . Productive cough    "DUE TO RADIATION"  .  Radiation 05/11/15-06/21/15   NSCLCA/right lung  . Smoking 1/2 pack a day or less 09/17/2016  . Spontaneous pneumothorax MANY YRS AGO AND AGAIN 04/04/15   HISTORY OF (ONLY)    ALLERGIES:  is allergic to aleve [naproxen].  MEDICATIONS:  Current Outpatient Prescriptions  Medication Sig Dispense Refill  . acetaminophen (TYLENOL) 325 MG tablet 2 tablets    . Aclidinium Bromide (TUDORZA PRESSAIR) 400 MCG/ACT AEPB Inhale 1 puff into the lungs 2 (two) times daily. 30 each 11  . albuterol (PROVENTIL HFA) 108 (90 Base) MCG/ACT inhaler 1-2 puffs as needed    . Arginine (RA L-ARGININE) 1000 MG TABS Take 500 mg by mouth daily.     . Ascorbic Acid (VITAMIN C) 1000 MG tablet Take 1,000 mg by mouth daily. Pt takes 2 5054mtabs    . B Complex Vitamins (B COMPLEX PO) Take 1 capsule by mouth daily.     . budesonide-formoterol (SYMBICORT) 160-4.5 MCG/ACT inhaler 2 puffs BID 1 Inhaler 5  . CALCIUM CITRATE PO Take 1 tablet by mouth daily.    . Cyanocobalamin (VITAMIN B 12 PO) Take 1 tablet by mouth daily.    . DUREZOL 0.05 % EMUL Place 1 drop into the right eye 2 (two) times daily.    . ferrous sulfate 325 (65 FE) MG tablet Take 325 mg by mouth daily with breakfast.    . guaiFENesin (MUCINEX) 600 MG 12 hr tablet Take 1 tablet (600 mg total) by mouth 2 (two) times daily as needed.    .Marland Kitchen  ketorolac (ACULAR) 0.5 % ophthalmic solution Place 1 drop into the right eye 2 (two) times daily.    . magic mouthwash SOLN Take 5 mLs by mouth 4 (four) times daily as needed.    . magnesium 30 MG tablet Take 30 mg by mouth daily.    . methylphenidate (RITALIN) 5 MG tablet Take 1 tablet (5 mg total) by mouth 2 (two) times daily with breakfast and lunch. 60 tablet 0  . Multiple Vitamin (MULTI VITAMIN DAILY PO) Take 1 tablet by mouth daily.    . prochlorperazine (COMPAZINE) 10 MG tablet Take 1 tablet (10 mg total) by mouth every 6 (six) hours as needed for nausea or vomiting. 30 tablet 0  . saccharomyces boulardii (FLORASTOR) 250 MG  capsule Take 250 mg by mouth 2 (two) times daily.    . vitamin A 10000 UNIT capsule 1 capsule with food or milk    . Omega-3 Fatty Acids (FISH OIL) 1000 MG CAPS Take 1,000 mg by mouth daily.    . ondansetron (ZOFRAN) 8 MG tablet Take 1 tablet (8 mg total) by mouth every 8 (eight) hours as needed for nausea or vomiting. (Patient not taking: Reported on 02/18/2017) 20 tablet 0   No current facility-administered medications for this visit.     SURGICAL HISTORY:  Past Surgical History:  Procedure Laterality Date  . CHEST TUBE INSERTION Right 08/02/2015   Procedure: INSERTION OF SECOND RIGHT CHEST TUBE WITH FLUROSCOPY;  Surgeon: Grace Isaac, MD;  Location: [facility];  Service: Thoracic;  Laterality: Right;  . CHEST TUBE INSERTION  07/28/2015  . COLONOSCOPY W/ BIOPSIES AND POLYPECTOMY    . HERNIA REPAIR  2011  . INGUINAL HERNIA REPAIR Left 07/15/2015   Procedure: OPEN LEFT INGUINAL HERNIA REPAIR;  Surgeon: Johnathan Hausen, MD;  Location: [facility];  Service: General;  Laterality: Left;  With MESH  . TONSILLECTOMY    . VIDEO BRONCHOSCOPY WITH ENDOBRONCHIAL ULTRASOUND N/A 04/18/2015   Procedure: VIDEO BRONCHOSCOPY WITH ENDOBRONCHIAL ULTRASOUND;  Surgeon: Grace Isaac, MD;  Location: [facility];  Service: Thoracic;  Laterality: N/A;  . VIDEO BRONCHOSCOPY WITH INSERTION OF INTERBRONCHIAL VALVE (IBV) N/A 08/02/2015   Procedure: VIDEO BRONCHOSCOPY WITH ATTEMPTED INSERTION OF INTERBRONCHIAL VALVE (IBV) WITH FLUROSCOPY;  Surgeon: Grace Isaac, MD;  Location[facility];  Service: Thoracic;  Laterality: N/A;  . WISDOM TOOTH EXTRACTION      REVIEW OF SYSTEMS:  A comprehensive review of systems was negative except for: Constitutional: positive for fatigue Respiratory: positive for cough and dyspnea on exertion   PHYSICAL EXAMINATION: General appearance: alert, cooperative, fatigued and no distress Head: Normocephalic, without obvious abnormality, atraumatic Neck: no adenopathy, no JVD, supple, symmetrical,  trachea midline and thyroid not enlarged, symmetric, no tenderness/mass/nodules Lymph nodes: Cervical, supraclavicular, and axillary nodes normal. Resp: wheezes RUL Back: symmetric, no curvature. ROM normal. No CVA tenderness. Cardio: regular rate and rhythm, S1, S2 normal, no murmur, click, rub or gallop GI: soft, non-tender; bowel sounds normal; no masses,  no organomegaly Extremities: extremities normal, atraumatic, no cyanosis or edema  ECOG PERFORMANCE STATUS: 1 - Symptomatic but completely ambulatory  Blood pressure (!) 154/77, pulse 72, temperature 97.8 F (36.6 C), temperature source Oral, resp. rate 18, height 5' 10"  (1.778 m), weight 117 lb 4.8 oz (53.2 kg), SpO2 100 %.  LABORATORY DATA: Lab Results  Component Value Date   WBC 9.2 03/04/2017   HGB 14.3 03/04/2017   HCT 43.2 03/04/2017   MCV 91.3 03/04/2017   PLT 223 03/04/2017  Chemistry      Component Value Date/Time   NA 140 03/04/2017 1204   K 4.3 03/04/2017 1204   CL 102 09/12/2015 0410   CO2 26 03/04/2017 1204   BUN 29.5 (H) 03/04/2017 1204   CREATININE 1.0 03/04/2017 1204      Component Value Date/Time   CALCIUM 10.1 03/04/2017 1204   ALKPHOS 108 03/04/2017 1204   AST 20 03/04/2017 1204   ALT 23 03/04/2017 1204   BILITOT 0.37 03/04/2017 1204       RADIOGRAPHIC STUDIES: No results found.  ASSESSMENT AND PLAN: This is a very pleasant 76 years old white male with metastatic non-small cell lung cancer, adenocarcinoma initially diagnosed as advanced disease in July 2016 status post short course of concurrent chemoradiation with weekly carboplatin and paclitaxel followed by disease progression after few months of observation. The patient is currently undergoing treatment with immunotherapy with Nivolumab status post 27 cycles. He tolerated the last cycle of his treatment well with no significant adverse effects. I recommended for the patient to proceed with cycle #28 today as a scheduled. I will see  him back for follow-up visit in 2 weeks for evaluation with the start of cycle #29. He was advised to call immediately if he has any concerning symptoms in the interval. The patient voices understanding of current disease status and treatment options and is in agreement with the current care plan. All questions were answered. The patient knows to call the clinic with any problems, questions or concerns. We can certainly see the patient much sooner if necessary. I spent 10 minutes counseling the patient face to face. The total time spent in the appointment was 15 minutes.  Disclaimer: This note was dictated with voice recognition software. Similar sounding words can inadvertently be transcribed and may not be corrected upon review.

## 2017-03-04 NOTE — Patient Instructions (Signed)
[facility] Discharge Instructions for Patients Receiving Chemotherapy  Today you received the following chemotherapy agents:  Nivolumab.  To help prevent nausea and vomiting after your treatment, we encourage you to take your nausea medication as directed.   If you develop nausea and vomiting that is not controlled by your nausea medication, call the clinic.   BELOW ARE SYMPTOMS THAT SHOULD BE REPORTED IMMEDIATELY:  *FEVER GREATER THAN 100.5 F  *CHILLS WITH OR WITHOUT FEVER  NAUSEA AND VOMITING THAT IS NOT CONTROLLED WITH YOUR NAUSEA MEDICATION  *UNUSUAL SHORTNESS OF BREATH  *UNUSUAL BRUISING OR BLEEDING  TENDERNESS IN MOUTH AND THROAT WITH OR WITHOUT PRESENCE OF ULCERS  *URINARY PROBLEMS  *BOWEL PROBLEMS  UNUSUAL RASH Items with * indicate a potential emergency and should be followed up as soon as possible.  Feel free to call the clinic you have any questions or concerns. The clinic phone number is [phone].  Please show the CHEMO ALERT CARD at check-in to the Emergency Department and triage nurse.   

## 2017-03-18 ENCOUNTER — Encounter: Payer: Self-pay | Admitting: Internal Medicine

## 2017-03-18 ENCOUNTER — Ambulatory Visit (HOSPITAL_BASED_OUTPATIENT_CLINIC_OR_DEPARTMENT_OTHER): Payer: 59

## 2017-03-18 ENCOUNTER — Other Ambulatory Visit (HOSPITAL_BASED_OUTPATIENT_CLINIC_OR_DEPARTMENT_OTHER): Payer: 59

## 2017-03-18 ENCOUNTER — Telehealth: Payer: Self-pay | Admitting: Internal Medicine

## 2017-03-18 ENCOUNTER — Ambulatory Visit (HOSPITAL_BASED_OUTPATIENT_CLINIC_OR_DEPARTMENT_OTHER): Payer: 59 | Admitting: Internal Medicine

## 2017-03-18 VITALS — BP 155/87 | HR 82 | Temp 98.7°F | Resp 18 | Ht 70.0 in | Wt 117.7 lb

## 2017-03-18 DIAGNOSIS — C3491 Malignant neoplasm of unspecified part of right bronchus or lung: Secondary | ICD-10-CM

## 2017-03-18 DIAGNOSIS — R5383 Other fatigue: Secondary | ICD-10-CM

## 2017-03-18 DIAGNOSIS — Z5112 Encounter for antineoplastic immunotherapy: Secondary | ICD-10-CM | POA: Diagnosis not present

## 2017-03-18 DIAGNOSIS — R0609 Other forms of dyspnea: Secondary | ICD-10-CM | POA: Diagnosis not present

## 2017-03-18 LAB — CBC WITH DIFFERENTIAL/PLATELET
BASO%: 0.4 % (ref 0.0–2.0)
Basophils Absolute: 0 10*3/uL (ref 0.0–0.1)
EOS%: 1.9 % (ref 0.0–7.0)
Eosinophils Absolute: 0.2 10*3/uL (ref 0.0–0.5)
HEMATOCRIT: 43.9 % (ref 38.4–49.9)
HEMOGLOBIN: 14.3 g/dL (ref 13.0–17.1)
LYMPH#: 0.9 10*3/uL (ref 0.9–3.3)
LYMPH%: 10.8 % — ABNORMAL LOW (ref 14.0–49.0)
MCH: 30 pg (ref 27.2–33.4)
MCHC: 32.6 g/dL (ref 32.0–36.0)
MCV: 92 fL (ref 79.3–98.0)
MONO#: 0.4 10*3/uL (ref 0.1–0.9)
MONO%: 5.5 % (ref 0.0–14.0)
NEUT#: 6.5 10*3/uL (ref 1.5–6.5)
NEUT%: 81.4 % — AB (ref 39.0–75.0)
Platelets: 196 10*3/uL (ref 140–400)
RBC: 4.77 10*6/uL (ref 4.20–5.82)
RDW: 14.4 % (ref 11.0–14.6)
WBC: 8 10*3/uL (ref 4.0–10.3)

## 2017-03-18 LAB — COMPREHENSIVE METABOLIC PANEL
ALBUMIN: 4.1 g/dL (ref 3.5–5.0)
ALK PHOS: 102 U/L (ref 40–150)
ALT: 22 U/L (ref 0–55)
ANION GAP: 10 meq/L (ref 3–11)
AST: 21 U/L (ref 5–34)
BILIRUBIN TOTAL: 0.35 mg/dL (ref 0.20–1.20)
BUN: 27.5 mg/dL — ABNORMAL HIGH (ref 7.0–26.0)
CO2: 27 mEq/L (ref 22–29)
Calcium: 10.1 mg/dL (ref 8.4–10.4)
Chloride: 106 mEq/L (ref 98–109)
Creatinine: 1.1 mg/dL (ref 0.7–1.3)
EGFR: 64 mL/min/{1.73_m2} — AB (ref >90)
GLUCOSE: 104 mg/dL (ref 70–140)
POTASSIUM: 4.3 meq/L (ref 3.5–5.1)
SODIUM: 142 meq/L (ref 136–145)
Total Protein: 7.6 g/dL (ref 6.4–8.3)

## 2017-03-18 MED ORDER — SODIUM CHLORIDE 0.9 % IV SOLN
240.0000 mg | Freq: Once | INTRAVENOUS | Status: AC
  Administered 2017-03-18: 240 mg via INTRAVENOUS
  Filled 2017-03-18: qty 24

## 2017-03-18 MED ORDER — SODIUM CHLORIDE 0.9 % IV SOLN
Freq: Once | INTRAVENOUS | Status: AC
  Administered 2017-03-18: 14:00:00 via INTRAVENOUS

## 2017-03-18 NOTE — Telephone Encounter (Signed)
Per 5/21 los - no additional appts need all appts scheduled per los and treatment plan.

## 2017-03-18 NOTE — Patient Instructions (Signed)
[facility] Discharge Instructions for Patients Receiving Chemotherapy  Today you received the following chemotherapy agents:  Nivolumab.  To help prevent nausea and vomiting after your treatment, we encourage you to take your nausea medication as directed.   If you develop nausea and vomiting that is not controlled by your nausea medication, call the clinic.   BELOW ARE SYMPTOMS THAT SHOULD BE REPORTED IMMEDIATELY:  *FEVER GREATER THAN 100.5 F  *CHILLS WITH OR WITHOUT FEVER  NAUSEA AND VOMITING THAT IS NOT CONTROLLED WITH YOUR NAUSEA MEDICATION  *UNUSUAL SHORTNESS OF BREATH  *UNUSUAL BRUISING OR BLEEDING  TENDERNESS IN MOUTH AND THROAT WITH OR WITHOUT PRESENCE OF ULCERS  *URINARY PROBLEMS  *BOWEL PROBLEMS  UNUSUAL RASH Items with * indicate a potential emergency and should be followed up as soon as possible.  Feel free to call the clinic you have any questions or concerns. The clinic phone number is [phone].  Please show the CHEMO ALERT CARD at check-in to the Emergency Department and triage nurse.   

## 2017-03-18 NOTE — Patient Instructions (Signed)
Steps to Quit Smoking Smoking tobacco can be bad for your health. It can also affect almost every organ in your body. Smoking puts you and people around you at risk for many serious long-lasting (chronic) diseases. Quitting smoking is hard, but it is one of the best things that you can do for your health. It is never too late to quit. What are the benefits of quitting smoking? When you quit smoking, you lower your risk for getting serious diseases and conditions. They can include:  Lung cancer or lung disease.  Heart disease.  Stroke.  Heart attack.  Not being able to have children (infertility).  Weak bones (osteoporosis) and broken bones (fractures). If you have coughing, wheezing, and shortness of breath, those symptoms may get better when you quit. You may also get sick less often. If you are pregnant, quitting smoking can help to lower your chances of having a baby of low birth weight. What can I do to help me quit smoking? Talk with your doctor about what can help you quit smoking. Some things you can do (strategies) include:  Quitting smoking totally, instead of slowly cutting back how much you smoke over a period of time.  Going to in-person counseling. You are more likely to quit if you go to many counseling sessions.  Using resources and support systems, such as:  Online chats with a counselor.  Phone quitlines.  Printed self-help materials.  Support groups or group counseling.  Text messaging programs.  Mobile phone apps or applications.  Taking medicines. Some of these medicines may have nicotine in them. If you are pregnant or breastfeeding, do not take any medicines to quit smoking unless your doctor says it is okay. Talk with your doctor about counseling or other things that can help you. Talk with your doctor about using more than one strategy at the same time, such as taking medicines while you are also going to in-person counseling. This can help make quitting  easier. What things can I do to make it easier to quit? Quitting smoking might feel very hard at first, but there is a lot that you can do to make it easier. Take these steps:  Talk to your family and friends. Ask them to support and encourage you.  Call phone quitlines, reach out to support groups, or work with a counselor.  Ask people who smoke to not smoke around you.  Avoid places that make you want (trigger) to smoke, such as:  Bars.  Parties.  Smoke-break areas at work.  Spend time with people who do not smoke.  Lower the stress in your life. Stress can make you want to smoke. Try these things to help your stress:  Getting regular exercise.  Deep-breathing exercises.  Yoga.  Meditating.  Doing a body scan. To do this, close your eyes, focus on one area of your body at a time from head to toe, and notice which parts of your body are tense. Try to relax the muscles in those areas.  Download or buy apps on your mobile phone or tablet that can help you stick to your quit plan. There are many free apps, such as QuitGuide from the CDC (Centers for Disease Control and Prevention). You can find more support from smokefree.gov and other websites. This information is not intended to replace advice given to you by your health care provider. Make sure you discuss any questions you have with your health care provider. Document Released: 08/11/2009 Document Revised: 06/12/2016 Document   Reviewed: 03/01/2015 Elsevier Interactive Patient Education  2017 Elsevier Inc.  

## 2017-03-18 NOTE — Progress Notes (Signed)
[name]   [phone]) [phone]  OFFICE PROGRESS NOTE  Aura Dials, MD [phone] [address], [address] [place] [address]  DIAGNOSIS: Progressive non-small cell lung cancer, adenocarcinoma initially diagnosed as stage IIIa (T2a, N2, M0) in July 2016.  [facility] One molecular studies reveal that he was positive for [place], [place], and STAG2 [id number]. He was negative for: EGFR, ALK, BRAF, MET, RET, ERBB2 and ROS1  PRIOR THERAPY: 1) Concurrent chemoradiation with chemotherapy in the form of weekly carboplatin for AUC 2 paclitaxel 45 mg/m given concurrent with radiation therapy. First cycle started 05/09/2015. Status post 7 cycles. 2) Consolidation chemotherapy with carboplatin for AUC of 5 on day 1 and gemcitabine 1000 MG/M2 on days 1 and 8 every 3 weeks. First dose 08/15/2015. Discontinued secondary to intolerance.  CURRENT THERAPY: Immunotherapy with Nivolumab 240 mg IV every 2 weeks, status post 28 cycles.  INTERVAL HISTORY: Gabriel [id number].[id number]. male returns to the clinic today for follow-up visit. The patient continues to tolerate his immunotherapy fairly well with no significant adverse effects. He continues to have mild shortness of breath with exertion and fatigue. He was given prescription for Ritalin but has not used it. He denied having any fever or chills. He has no chest pain or hemoptysis. He has no nausea, vomiting, diarrhea or constipation. He denied having any weight loss or night sweats. He is here today for evaluation before starting cycle #29.   MEDICAL HISTORY: Past Medical History:  Diagnosis Date  . Cancer ([place]    skin cancer  . Constipation    AFTER ANESTHESIA  . Difficulty sleeping   . History of nonmelanoma skin cancer   . Hypertension 10/30/2016  . Inguinal hernia   . Lung mass    right / HAS HAD RADIATION AND CHEMO FOR LUNG CANCER  . Pneumothorax on right 08/24/2015  . Productive cough      "DUE TO RADIATION"  . Radiation 05/11/15-06/21/15   NSCLCA/right lung  . Smoking 1/2 pack a day or less 09/17/2016  . Spontaneous pneumothorax MANY YRS AGO AND AGAIN 04/04/15   HISTORY OF (ONLY)    ALLERGIES:  is allergic to aleve [naproxen].  MEDICATIONS:  Current Outpatient Prescriptions  Medication Sig Dispense Refill  . acetaminophen (TYLENOL) 325 MG tablet 2 tablets    . Aclidinium Bromide (TUDORZA PRESSAIR) 400 MCG/ACT AEPB Inhale 1 puff into the lungs 2 (two) times daily. 30 each 11  . Arginine (RA L-ARGININE) 1000 MG TABS Take 500 mg by mouth daily.     . Ascorbic Acid (VITAMIN C) 1000 MG tablet Take 1,000 mg by mouth daily. Pt takes 2 5017mtabs    . B Complex Vitamins (B COMPLEX PO) Take 1 capsule by mouth daily.     . budesonide-formoterol (SYMBICORT) 160-4.5 MCG/ACT inhaler 2 puffs BID 1 Inhaler 5  . CALCIUM CITRATE PO Take 1 tablet by mouth daily.    . Cyanocobalamin (VITAMIN B 12 PO) Take 1 tablet by mouth daily.    . DUREZOL 0.05 % EMUL Place 1 drop into the right eye 2 (two) times daily.    . ferrous sulfate 325 (65 FE) MG tablet Take 325 mg by mouth daily with breakfast.    . guaiFENesin (MUCINEX) 600 MG 12 hr tablet Take 1 tablet (600 mg total) by mouth 2 (two) times daily as needed.    . Marland Kitchenetorolac (ACULAR) 0.5 % ophthalmic solution Place 1 drop into the right  eye 2 (two) times daily.    . magic mouthwash SOLN Take 5 mLs by mouth 4 (four) times daily as needed.    . magnesium 30 MG tablet Take 30 mg by mouth daily.    . Multiple Vitamin (MULTI VITAMIN DAILY PO) Take 1 tablet by mouth daily.    . Omega-3 Fatty Acids (FISH OIL) 1000 MG CAPS Take 1,000 mg by mouth daily.    Marland Kitchen saccharomyces boulardii (FLORASTOR) 250 MG capsule Take 250 mg by mouth 2 (two) times daily.    . vitamin A 10000 UNIT capsule 1 capsule with food or milk    . albuterol (PROVENTIL HFA) 108 (90 Base) MCG/ACT inhaler 1-2 puffs as needed    . methylphenidate (RITALIN) 5 MG tablet Take 1 tablet (5 mg  total) by mouth 2 (two) times daily with breakfast and lunch. (Patient not taking: Reported on 03/18/2017) 60 tablet 0  . ondansetron (ZOFRAN) 8 MG tablet Take 1 tablet (8 mg total) by mouth every 8 (eight) hours as needed for nausea or vomiting. (Patient not taking: Reported on 02/18/2017) 20 tablet 0  . prochlorperazine (COMPAZINE) 10 MG tablet Take 1 tablet (10 mg total) by mouth every 6 (six) hours as needed for nausea or vomiting. (Patient not taking: Reported on 03/18/2017) 30 tablet 0   No current facility-administered medications for this visit.     SURGICAL HISTORY:  Past Surgical History:  Procedure Laterality Date  . CHEST TUBE INSERTION Right 08/02/2015   Procedure: INSERTION OF SECOND RIGHT CHEST TUBE WITH FLUROSCOPY;  Surgeon: Grace Isaac, MD;  Location: [facility];  Service: Thoracic;  Laterality: Right;  . CHEST TUBE INSERTION  07/28/2015  . COLONOSCOPY W/ BIOPSIES AND POLYPECTOMY    . HERNIA REPAIR  2011  . INGUINAL HERNIA REPAIR Left 07/15/2015   Procedure: OPEN LEFT INGUINAL HERNIA REPAIR;  Surgeon: Johnathan Hausen, MD;  Location: [facility];  Service: General;  Laterality: Left;  With MESH  . TONSILLECTOMY    . VIDEO BRONCHOSCOPY WITH ENDOBRONCHIAL ULTRASOUND N/A 04/18/2015   Procedure: VIDEO BRONCHOSCOPY WITH ENDOBRONCHIAL ULTRASOUND;  Surgeon: Grace Isaac, MD;  Location: [facility];  Service: Thoracic;  Laterality: N/A;  . VIDEO BRONCHOSCOPY WITH INSERTION OF INTERBRONCHIAL VALVE (IBV) N/A 08/02/2015   Procedure: VIDEO BRONCHOSCOPY WITH ATTEMPTED INSERTION OF INTERBRONCHIAL VALVE (IBV) WITH FLUROSCOPY;  Surgeon: Grace Isaac, MD;  Location: [facility];  Service: Thoracic;  Laterality: N/A;  . WISDOM TOOTH EXTRACTION      REVIEW OF SYSTEMS:  A comprehensive review of systems was negative except for: Constitutional: positive for fatigue Respiratory: positive for cough and dyspnea on exertion   PHYSICAL EXAMINATION: General appearance: alert, cooperative, fatigued and no  distress Head: Normocephalic, without obvious abnormality, atraumatic Neck: no adenopathy, no JVD, supple, symmetrical, trachea midline and thyroid not enlarged, symmetric, no tenderness/mass/nodules Lymph nodes: Cervical, supraclavicular, and axillary nodes normal. Resp: wheezes RUL Back: symmetric, no curvature. ROM normal. No CVA tenderness. Cardio: regular rate and rhythm, S1, S2 normal, no murmur, click, rub or gallop GI: soft, non-tender; bowel sounds normal; no masses,  no organomegaly Extremities: extremities normal, atraumatic, no cyanosis or edema  ECOG PERFORMANCE STATUS: 1 - Symptomatic but completely ambulatory  Blood pressure (!) 155/87, pulse 82, temperature 98.7 F (37.1 C), temperature source Oral, resp. rate 18, height 5' 10"  (1.778 m), weight 117 lb 11.2 oz (53.4 kg), SpO2 99 %.  LABORATORY DATA: Lab Results  Component Value Date   WBC 8.0 03/18/2017   HGB 14.3  03/18/2017   HCT 43.9 03/18/2017   MCV 92.0 03/18/2017   PLT 196 03/18/2017      Chemistry      Component Value Date/Time   NA 142 03/18/2017 1209   K 4.3 03/18/2017 1209   CL 102 09/12/2015 0410   CO2 27 03/18/2017 1209   BUN 27.5 (H) 03/18/2017 1209   CREATININE 1.1 03/18/2017 1209      Component Value Date/Time   CALCIUM 10.1 03/18/2017 1209   ALKPHOS 102 03/18/2017 1209   AST 21 03/18/2017 1209   ALT 22 03/18/2017 1209   BILITOT 0.35 03/18/2017 1209       RADIOGRAPHIC STUDIES: No results found.  ASSESSMENT AND PLAN: This is a very pleasant 76 years old white male with metastatic non-small cell lung cancer, adenocarcinoma status post short course of concurrent chemoradiation and currently on treatment with immunotherapy with Nivolumab status post 28 cycles. The patient is tolerating his treatment well with no significant adverse effects. I recommended for him to proceed with cycle #29 today as a scheduled. I will see him back for follow-up visit in 2 weeks for evaluation after repeating  CT scan of the chest for restaging of his disease. The patient was advised to call immediately if he has any concerning symptoms in the interval. The patient voices understanding of current disease status and treatment options and is in agreement with the current care plan. All questions were answered. The patient knows to call the clinic with any problems, questions or concerns. We can certainly see the patient much sooner if necessary. I spent 10 minutes counseling the patient face to face. The total time spent in the appointment was 15 minutes.  Disclaimer: This note was dictated with voice recognition software. Similar sounding words can inadvertently be transcribed and may not be corrected upon review.

## 2017-03-29 ENCOUNTER — Ambulatory Visit (HOSPITAL_COMMUNITY)
Admission: RE | Admit: 2017-03-29 | Discharge: 2017-03-29 | Disposition: A | Discharge status: Home / Self Care | Payer: 59 | Source: Ambulatory Visit | Attending: Internal Medicine | Admitting: Internal Medicine

## 2017-03-29 DIAGNOSIS — D3501 Benign neoplasm of right adrenal gland: Secondary | ICD-10-CM | POA: Insufficient documentation

## 2017-03-29 DIAGNOSIS — C3491 Malignant neoplasm of unspecified part of right bronchus or lung: Secondary | ICD-10-CM | POA: Insufficient documentation

## 2017-03-29 DIAGNOSIS — I700 Atherosclerosis of aorta: Secondary | ICD-10-CM | POA: Insufficient documentation

## 2017-03-29 DIAGNOSIS — Z5112 Encounter for antineoplastic immunotherapy: Secondary | ICD-10-CM | POA: Diagnosis present

## 2017-03-29 DIAGNOSIS — J439 Emphysema, unspecified: Secondary | ICD-10-CM | POA: Insufficient documentation

## 2017-03-29 DIAGNOSIS — J470 Bronchiectasis with acute lower respiratory infection: Secondary | ICD-10-CM | POA: Diagnosis not present

## 2017-03-29 DIAGNOSIS — M899 Disorder of bone, unspecified: Secondary | ICD-10-CM | POA: Insufficient documentation

## 2017-03-29 MED ORDER — IOPAMIDOL (ISOVUE-300) INJECTION 61%
INTRAVENOUS | Status: AC
  Filled 2017-03-29: qty 100

## 2017-03-29 MED ORDER — IOPAMIDOL (ISOVUE-300) INJECTION 61%
100.0000 mL | Freq: Once | INTRAVENOUS | Status: AC | PRN
  Administered 2017-03-29: 100 mL via INTRAVENOUS

## 2017-04-01 ENCOUNTER — Other Ambulatory Visit (HOSPITAL_BASED_OUTPATIENT_CLINIC_OR_DEPARTMENT_OTHER): Payer: 59

## 2017-04-01 ENCOUNTER — Telehealth: Payer: Self-pay | Admitting: Internal Medicine

## 2017-04-01 ENCOUNTER — Encounter: Payer: Self-pay | Admitting: Internal Medicine

## 2017-04-01 ENCOUNTER — Ambulatory Visit (HOSPITAL_BASED_OUTPATIENT_CLINIC_OR_DEPARTMENT_OTHER): Payer: 59 | Admitting: Internal Medicine

## 2017-04-01 ENCOUNTER — Ambulatory Visit (HOSPITAL_BASED_OUTPATIENT_CLINIC_OR_DEPARTMENT_OTHER): Payer: 59

## 2017-04-01 VITALS — BP 174/91 | HR 94 | Temp 97.6°F | Resp 18 | Ht 70.0 in | Wt 116.4 lb

## 2017-04-01 VITALS — BP 109/52 | HR 71 | Temp 97.9°F

## 2017-04-01 DIAGNOSIS — Z5112 Encounter for antineoplastic immunotherapy: Secondary | ICD-10-CM

## 2017-04-01 DIAGNOSIS — R634 Abnormal weight loss: Secondary | ICD-10-CM

## 2017-04-01 DIAGNOSIS — R0609 Other forms of dyspnea: Secondary | ICD-10-CM

## 2017-04-01 DIAGNOSIS — I10 Essential (primary) hypertension: Secondary | ICD-10-CM | POA: Diagnosis not present

## 2017-04-01 DIAGNOSIS — M899 Disorder of bone, unspecified: Secondary | ICD-10-CM | POA: Diagnosis not present

## 2017-04-01 DIAGNOSIS — C3491 Malignant neoplasm of unspecified part of right bronchus or lung: Secondary | ICD-10-CM | POA: Diagnosis not present

## 2017-04-01 DIAGNOSIS — R530 Neoplastic (malignant) related fatigue: Secondary | ICD-10-CM

## 2017-04-01 DIAGNOSIS — J441 Chronic obstructive pulmonary disease with (acute) exacerbation: Secondary | ICD-10-CM

## 2017-04-01 HISTORY — DX: Abnormal weight loss: R63.4

## 2017-04-01 LAB — CBC WITH DIFFERENTIAL/PLATELET
BASO%: 0.5 % (ref 0.0–2.0)
BASOS ABS: 0 10*3/uL (ref 0.0–0.1)
EOS ABS: 0.1 10*3/uL (ref 0.0–0.5)
EOS%: 1.8 % (ref 0.0–7.0)
HCT: 44.3 % (ref 38.4–49.9)
HGB: 14.6 g/dL (ref 13.0–17.1)
LYMPH%: 10.9 % — AB (ref 14.0–49.0)
MCH: 30.2 pg (ref 27.2–33.4)
MCHC: 33 g/dL (ref 32.0–36.0)
MCV: 91.7 fL (ref 79.3–98.0)
MONO#: 0.7 10*3/uL (ref 0.1–0.9)
MONO%: 8.9 % (ref 0.0–14.0)
NEUT#: 6.2 10*3/uL (ref 1.5–6.5)
NEUT%: 77.9 % — AB (ref 39.0–75.0)
PLATELETS: 200 10*3/uL (ref 140–400)
RBC: 4.83 10*6/uL (ref 4.20–5.82)
RDW: 14.1 % (ref 11.0–14.6)
WBC: 8 10*3/uL (ref 4.0–10.3)
lymph#: 0.9 10*3/uL (ref 0.9–3.3)

## 2017-04-01 LAB — COMPREHENSIVE METABOLIC PANEL
ALBUMIN: 4.2 g/dL (ref 3.5–5.0)
ALK PHOS: 102 U/L (ref 40–150)
ALT: 27 U/L (ref 0–55)
AST: 25 U/L (ref 5–34)
Anion Gap: 9 mEq/L (ref 3–11)
BILIRUBIN TOTAL: 0.39 mg/dL (ref 0.20–1.20)
BUN: 31.4 mg/dL — AB (ref 7.0–26.0)
CALCIUM: 10.2 mg/dL (ref 8.4–10.4)
CO2: 25 mEq/L (ref 22–29)
Chloride: 104 mEq/L (ref 98–109)
Creatinine: 1 mg/dL (ref 0.7–1.3)
EGFR: 75 mL/min/{1.73_m2} — AB (ref >90)
GLUCOSE: 101 mg/dL (ref 70–140)
POTASSIUM: 4.1 meq/L (ref 3.5–5.1)
Sodium: 138 mEq/L (ref 136–145)
TOTAL PROTEIN: 7.9 g/dL (ref 6.4–8.3)

## 2017-04-01 MED ORDER — SODIUM CHLORIDE 0.9 % IV SOLN
Freq: Once | INTRAVENOUS | Status: AC
  Administered 2017-04-01: 14:00:00 via INTRAVENOUS

## 2017-04-01 MED ORDER — SODIUM CHLORIDE 0.9 % IV SOLN
240.0000 mg | Freq: Once | INTRAVENOUS | Status: AC
  Administered 2017-04-01: 240 mg via INTRAVENOUS
  Filled 2017-04-01: qty 24

## 2017-04-01 NOTE — Progress Notes (Signed)
[place] [phone]   [phone]) [phone]  OFFICE PROGRESS NOTE  Aura Dials, MD [phone] [address] [place] [address]  DIAGNOSIS: Metastatic non-small cell lung cancer, adenocarcinoma initially diagnosed as unresectable stage IIIa (T2a, N2, M0) in July 2016.  [facility] One molecular studies reveal that he was positive for [place], [place], and STAG2 [id number]*20. He was negative for: EGFR, ALK, BRAF, MET, RET, ERBB2 and ROS1  PRIOR THERAPY: 1) Concurrent chemoradiation with chemotherapy in the form of weekly carboplatin for AUC 2 paclitaxel 45 mg/m given concurrent with radiation therapy. First cycle started 05/09/2015. Status post 7 cycles. 2) Consolidation chemotherapy with carboplatin for AUC of 5 on day 1 and gemcitabine 1000 MG/M2 on days 1 and 8 every 3 weeks. First dose 08/15/2015. Discontinued secondary to intolerance.  CURRENT THERAPY: Immunotherapy with Nivolumab 240 mg IV every 2 weeks, status post 29 cycles.  INTERVAL HISTORY: Gabriel HANGARTNER89y.o. male returns to the clinic today for follow-up visit accompanied by his daughter [name] The patient is feeling fine today with no specific complaints. Unfortunately he lost a few more pounds of weight. He denied having any chest pain but continues to have shortness of breath with exertion and mild cough with no hemoptysis. He denied having any fever or chills. He has no nausea, vomiting, diarrhea or constipation. He has no headache or visual changes. He denied having any bony aches. He had CT scan of the chest, abdomen and pelvis performed recently and is here for evaluation and discussion of his scan results.   MEDICAL HISTORY: Past Medical History:  Diagnosis Date  . [place] ([place]    skin cancer  . Constipation    AFTER ANESTHESIA  . Difficulty sleeping   . History of nonmelanoma skin cancer   . Hypertension 10/30/2016  . Inguinal hernia   . Lung mass    right  / HAS HAD RADIATION AND CHEMO FOR LUNG CANCER  . Pneumothorax on right 08/24/2015  . Productive cough    "DUE TO RADIATION"  . Radiation 05/11/15-06/21/15   NSCLCA/right lung  . Smoking 1/2 pack a day or less 09/17/2016  . Spontaneous pneumothorax MANY YRS AGO AND AGAIN 04/04/15   HISTORY OF (ONLY)    ALLERGIES:  is allergic to aleve [naproxen].  MEDICATIONS:  Current Outpatient Prescriptions  Medication Sig Dispense Refill  . acetaminophen (TYLENOL) 325 MG tablet 2 tablets    . Aclidinium Bromide (TUDORZA PRESSAIR) 400 MCG/ACT AEPB Inhale 1 puff into the lungs 2 (two) times daily. 30 each 11  . albuterol (PROVENTIL HFA) 108 (90 Base) MCG/ACT inhaler 1-2 puffs as needed    . Arginine (RA L-ARGININE) 1000 MG TABS Take 500 mg by mouth daily.     . Ascorbic Acid (VITAMIN C) 1000 MG tablet Take 1,000 mg by mouth daily. Pt takes 2 5076mtabs    . B Complex Vitamins (B COMPLEX PO) Take 1 capsule by mouth daily.     . budesonide-formoterol (SYMBICORT) 160-4.5 MCG/ACT inhaler 2 puffs BID 1 Inhaler 5  . CALCIUM CITRATE PO Take 1 tablet by mouth daily.    . Cyanocobalamin (VITAMIN B 12 PO) Take 1 tablet by mouth daily.    . DUREZOL 0.05 % EMUL Place 1 drop into the right eye 2 (two) times daily.    . ferrous sulfate 325 (65 FE) MG tablet Take 325 mg by mouth daily with breakfast.    . guaiFENesin (MUCINEX) 600  MG 12 hr tablet Take 1 tablet (600 mg total) by mouth 2 (two) times daily as needed.    Marland Kitchen ketorolac (ACULAR) 0.5 % ophthalmic solution Place 1 drop into the right eye 2 (two) times daily.    . magic mouthwash SOLN Take 5 mLs by mouth 4 (four) times daily as needed.    . magnesium 30 MG tablet Take 30 mg by mouth daily.    . methylphenidate (RITALIN) 5 MG tablet Take 1 tablet (5 mg total) by mouth 2 (two) times daily with breakfast and lunch. 60 tablet 0  . Multiple Vitamin (MULTI VITAMIN DAILY PO) Take 1 tablet by mouth daily.    . Omega-3 Fatty Acids (FISH OIL) 1000 MG CAPS Take 1,000 mg by  mouth daily.    . ondansetron (ZOFRAN) 8 MG tablet Take 1 tablet (8 mg total) by mouth every 8 (eight) hours as needed for nausea or vomiting. 20 tablet 0  . prochlorperazine (COMPAZINE) 10 MG tablet Take 1 tablet (10 mg total) by mouth every 6 (six) hours as needed for nausea or vomiting. 30 tablet 0  . saccharomyces boulardii (FLORASTOR) 250 MG capsule Take 250 mg by mouth 2 (two) times daily.    . vitamin A 10000 UNIT capsule 1 capsule with food or milk     No current facility-administered medications for this visit.     SURGICAL HISTORY:  Past Surgical History:  Procedure Laterality Date  . CHEST TUBE INSERTION Right 08/02/2015   Procedure: INSERTION OF SECOND RIGHT CHEST TUBE WITH FLUROSCOPY;  Surgeon: Grace Isaac, MD;  Location: [facility];  Service: Thoracic;  Laterality: Right;  . CHEST TUBE INSERTION  07/28/2015  . COLONOSCOPY W/ BIOPSIES AND POLYPECTOMY    . HERNIA REPAIR  2011  . INGUINAL HERNIA REPAIR Left 07/15/2015   Procedure: OPEN LEFT INGUINAL HERNIA REPAIR;  Surgeon: Johnathan Hausen, MD;  Location: [facility];  Service: General;  Laterality: Left;  With MESH  . TONSILLECTOMY    . VIDEO BRONCHOSCOPY WITH ENDOBRONCHIAL ULTRASOUND N/A 04/18/2015   Procedure: VIDEO BRONCHOSCOPY WITH ENDOBRONCHIAL ULTRASOUND;  Surgeon: Grace Isaac, MD;  Location: [facility];  Service: Thoracic;  Laterality: N/A;  . VIDEO BRONCHOSCOPY WITH INSERTION OF INTERBRONCHIAL VALVE (IBV) N/A 08/02/2015   Procedure: VIDEO BRONCHOSCOPY WITH ATTEMPTED INSERTION OF INTERBRONCHIAL VALVE (IBV) WITH FLUROSCOPY;  Surgeon: Grace Isaac, MD;  Location: [facility];  Service: Thoracic;  Laterality: N/A;  . WISDOM TOOTH EXTRACTION      REVIEW OF SYSTEMS:  Constitutional: positive for fatigue and weight loss Eyes: negative Ears, nose, mouth, throat, and face: negative Respiratory: positive for cough and dyspnea on exertion Cardiovascular: negative Gastrointestinal: negative Genitourinary:negative Integument/breast:  negative Hematologic/lymphatic: negative Musculoskeletal:negative Neurological: negative Behavioral/Psych: negative Endocrine: negative Allergic/Immunologic: negative   PHYSICAL EXAMINATION: General appearance: alert, cooperative, fatigued and no distress Head: Normocephalic, without obvious abnormality, atraumatic Neck: no adenopathy, no JVD, supple, symmetrical, trachea midline and thyroid not enlarged, symmetric, no tenderness/mass/nodules Lymph nodes: Cervical, supraclavicular, and axillary nodes normal. Resp: wheezes RUL Back: symmetric, no curvature. ROM normal. No CVA tenderness. Cardio: regular rate and rhythm, S1, S2 normal, no murmur, click, rub or gallop GI: soft, non-tender; bowel sounds normal; no masses,  no organomegaly Extremities: extremities normal, atraumatic, no cyanosis or edema Neurologic: Alert and oriented X 3, normal strength and tone. Normal symmetric reflexes. Normal coordination and gait  ECOG PERFORMANCE STATUS: 1 - Symptomatic but completely ambulatory  Blood pressure (!) 174/91, pulse 94, temperature 97.6 F (36.4 C), temperature  source Oral, resp. rate 18, height 5' 10"  (1.778 m), weight 116 lb 6.4 oz (52.8 kg), SpO2 100 %.  LABORATORY DATA: Lab Results  Component Value Date   WBC 8.0 04/01/2017   HGB 14.6 04/01/2017   HCT 44.3 04/01/2017   MCV 91.7 04/01/2017   PLT 200 04/01/2017      Chemistry      Component Value Date/Time   NA 142 03/18/2017 1209   K 4.3 03/18/2017 1209   CL 102 09/12/2015 0410   CO2 27 03/18/2017 1209   BUN 27.5 (H) 03/18/2017 1209   CREATININE 1.1 03/18/2017 1209      Component Value Date/Time   CALCIUM 10.1 03/18/2017 1209   ALKPHOS 102 03/18/2017 1209   AST 21 03/18/2017 1209   ALT 22 03/18/2017 1209   BILITOT 0.35 03/18/2017 1209       RADIOGRAPHIC STUDIES: Ct Chest W Contrast  Result Date: 03/29/2017 CLINICAL DATA:  Stage III non-small cell carcinoma of the right lung. EXAM: CT CHEST, ABDOMEN, AND  PELVIS WITH CONTRAST TECHNIQUE: Multidetector CT imaging of the chest, abdomen and pelvis was performed following the standard protocol during bolus administration of intravenous contrast. CONTRAST:  163m ISOVUE-300 IOPAMIDOL (ISOVUE-300) INJECTION 61% COMPARISON:  Chest CT 02/01/2017.  Abdomen pelvis CT 08/31/2016 FINDINGS: CT CHEST FINDINGS Cardiovascular: The heart size is normal. No pericardial effusion. Coronary artery calcification is noted. Atherosclerotic calcification is noted in the wall of the thoracic aorta. Mediastinum/Nodes: 9 mm short axis high right paratracheal lymph node not substantially changed in the interval. No left hilar lymphadenopathy. Stable appearance abnormal soft tissue in the right hilum, most likely treatment related. Lungs/Pleura: Marked volume loss right hemithorax, stable in the interval. Dense consolidation with bronchiectasis right upper lung is similar to prior. Architectural distortion and thickening of the major fissure at the right base also not substantially changed. 10 x 13 mm anterior subpleural spiculated nodule in the right lung (image 113 series for) is similar to prior. 6 mm posterior subpleural nodule seen in the right lung base (image 108) is also unchanged. Clustered nodularity in the right lateral costophrenic sulcus (image 128 series 4) similar to previous. The 8 mm medial and posterior subpleural right lower lobe nodule is stable. 16 mm central area of architectural distortion in the left lower lobe (image 143) is stable at 15 mm. Scattered ground-glass nodular opacities are seen in both lungs, similar to prior study. The solid 7 mm nodule superior segment left lower lobe measures 6 mm today. 10 mm irregular nodule in the left upper lobe (image 50) is stable. Changes of emphysema. No substantial pleural effusion. Musculoskeletal: Bone windows reveal no worrisome lytic or sclerotic osseous lesions. CT ABDOMEN PELVIS FINDINGS Hepatobiliary: No focal abnormality  within the liver parenchyma. There is no evidence for gallstones, gallbladder wall thickening, or pericholecystic fluid. Mild prominence extrahepatic bile ducts similar to prior. Pancreas: No focal mass lesion. No dilatation of the main duct. No intraparenchymal cyst. No peripancreatic edema. Spleen: No splenomegaly. No focal mass lesion. Adrenals/Urinary Tract: 2 cm right adrenal adenoma is unchanged. Left adrenal gland unremarkable. Kidneys unremarkable. No evidence for hydroureter. The urinary bladder appears normal for the degree of distention. Stomach/Bowel: Stomach is nondistended. No gastric wall thickening. No evidence of outlet obstruction. Duodenum is normally positioned as is the ligament of Treitz. No small bowel wall thickening. No small bowel dilatation. The terminal ileum is normal. The appendix is not visualized, but there is no edema or inflammation in the region of the  cecum. Diverticular changes are noted in the left colon without evidence of diverticulitis. Vascular/Lymphatic: There is abdominal aortic atherosclerosis without aneurysm. There is no gastrohepatic or hepatoduodenal ligament lymphadenopathy. No intraperitoneal or retroperitoneal lymphadenopathy. No pelvic sidewall lymphadenopathy. Reproductive: Prostate gland is enlarged. Other: No substantial intraperitoneal free fluid. Musculoskeletal: Bone windows reveal no worrisome lytic or sclerotic osseous lesions. T8 compression fracture similar to prior. IMPRESSION: 1. 16 mm sclerotic lesion posterior right iliac bone has progressed from 8 mm previous. Metastatic disease is a concern. 2. Otherwise no substantial interval change in exam. Marked volume loss right hemithorax with dense consolidation bronchiectasis in the right upper lobe suggesting prior therapy. 3. The patchy and nodular consolidation seen scattered in the right lower lobe is similar to prior and multiple bilateral pulmonary nodular opacity show no appreciable interval change.  4. Stable borderline to mild mediastinal lymphadenopathy. 5. Abdominal aortic atherosclerosis. 6. Emphysema. 7. 2 cm right adrenal adenoma. Electronically Signed   By: Misty Stanley M.D.   On: 03/29/2017 19:26   Ct Abdomen Pelvis W Contrast  Result Date: 03/29/2017 CLINICAL DATA:  Stage III non-small cell carcinoma of the right lung. EXAM: CT CHEST, ABDOMEN, AND PELVIS WITH CONTRAST TECHNIQUE: Multidetector CT imaging of the chest, abdomen and pelvis was performed following the standard protocol during bolus administration of intravenous contrast. CONTRAST:  177m ISOVUE-300 IOPAMIDOL (ISOVUE-300) INJECTION 61% COMPARISON:  Chest CT 02/01/2017.  Abdomen pelvis CT 08/31/2016 FINDINGS: CT CHEST FINDINGS Cardiovascular: The heart size is normal. No pericardial effusion. Coronary artery calcification is noted. Atherosclerotic calcification is noted in the wall of the thoracic aorta. Mediastinum/Nodes: 9 mm short axis high right paratracheal lymph node not substantially changed in the interval. No left hilar lymphadenopathy. Stable appearance abnormal soft tissue in the right hilum, most likely treatment related. Lungs/Pleura: Marked volume loss right hemithorax, stable in the interval. Dense consolidation with bronchiectasis right upper lung is similar to prior. Architectural distortion and thickening of the major fissure at the right base also not substantially changed. 10 x 13 mm anterior subpleural spiculated nodule in the right lung (image 113 series for) is similar to prior. 6 mm posterior subpleural nodule seen in the right lung base (image 108) is also unchanged. Clustered nodularity in the right lateral costophrenic sulcus (image 128 series 4) similar to previous. The 8 mm medial and posterior subpleural right lower lobe nodule is stable. 16 mm central area of architectural distortion in the left lower lobe (image 143) is stable at 15 mm. Scattered ground-glass nodular opacities are seen in both lungs,  similar to prior study. The solid 7 mm nodule superior segment left lower lobe measures 6 mm today. 10 mm irregular nodule in the left upper lobe (image 50) is stable. Changes of emphysema. No substantial pleural effusion. Musculoskeletal: Bone windows reveal no worrisome lytic or sclerotic osseous lesions. CT ABDOMEN PELVIS FINDINGS Hepatobiliary: No focal abnormality within the liver parenchyma. There is no evidence for gallstones, gallbladder wall thickening, or pericholecystic fluid. Mild prominence extrahepatic bile ducts similar to prior. Pancreas: No focal mass lesion. No dilatation of the main duct. No intraparenchymal cyst. No peripancreatic edema. Spleen: No splenomegaly. No focal mass lesion. Adrenals/Urinary Tract: 2 cm right adrenal adenoma is unchanged. Left adrenal gland unremarkable. Kidneys unremarkable. No evidence for hydroureter. The urinary bladder appears normal for the degree of distention. Stomach/Bowel: Stomach is nondistended. No gastric wall thickening. No evidence of outlet obstruction. Duodenum is normally positioned as is the ligament of Treitz. No small bowel wall thickening. No  small bowel dilatation. The terminal ileum is normal. The appendix is not visualized, but there is no edema or inflammation in the region of the cecum. Diverticular changes are noted in the left colon without evidence of diverticulitis. Vascular/Lymphatic: There is abdominal aortic atherosclerosis without aneurysm. There is no gastrohepatic or hepatoduodenal ligament lymphadenopathy. No intraperitoneal or retroperitoneal lymphadenopathy. No pelvic sidewall lymphadenopathy. Reproductive: Prostate gland is enlarged. Other: No substantial intraperitoneal free fluid. Musculoskeletal: Bone windows reveal no worrisome lytic or sclerotic osseous lesions. T8 compression fracture similar to prior. IMPRESSION: 1. 16 mm sclerotic lesion posterior right iliac bone has progressed from 8 mm previous. Metastatic disease is a  concern. 2. Otherwise no substantial interval change in exam. Marked volume loss right hemithorax with dense consolidation bronchiectasis in the right upper lobe suggesting prior therapy. 3. The patchy and nodular consolidation seen scattered in the right lower lobe is similar to prior and multiple bilateral pulmonary nodular opacity show no appreciable interval change. 4. Stable borderline to mild mediastinal lymphadenopathy. 5. Abdominal aortic atherosclerosis. 6. Emphysema. 7. 2 cm right adrenal adenoma. Electronically Signed   By: Misty Stanley M.D.   On: 03/29/2017 19:26    ASSESSMENT AND PLAN: This is a very pleasant 76 years old white male with metastatic non-small cell lung cancer, adenocarcinoma status post short course of concurrent chemoradiation with weekly carboplatin and paclitaxel. He is currently undergoing treatment with immunotherapy with Nivolumab 240 mg IV every 2 weeks is status post 29 cycles. The patient has been tolerating this treatment fairly well with no significant adverse effects. He had repeat CT scan of the chest, abdomen and pelvis performed recently. I personally and independently reviewed the scan images and discuss the results and showed the images to the patient and his daughter today. His scan is fairly stable but there was progressive sclerotic lesion in the posterior right iliac bone of unclear significance but could be metastatic. I recommended for the patient to continue his current treatment with immunotherapy with for now. We will continue to monitor this lesion closely on upcoming CT scan and if it continues to progress, may consider biopsy or palliative radiotherapy. For hypertension, the patient will continue with his blood pressure medication for now. His blood pressure is much better at home. For the weight loss, I discussed with the patient options for management of his condition including increasing oral intake and snacks versus consideration of treatment  with Megace. He would like to hold on Megace for now. I will see him back for follow-up visit in 2 weeks for reevaluation before the next cycle of his treatment. He was advised to call immediately if he has any concerning symptoms in the interval. The patient voices understanding of current disease status and treatment options and is in agreement with the current care plan. All questions were answered. The patient knows to call the clinic with any problems, questions or concerns. We can certainly see the patient much sooner if necessary.  Disclaimer: This note was dictated with voice recognition software. Similar sounding words can inadvertently be transcribed and may not be corrected upon review.

## 2017-04-01 NOTE — Patient Instructions (Signed)
[facility] Discharge Instructions for Patients Receiving Chemotherapy  Today you received the following chemotherapy agents Nivolumab  To help prevent nausea and vomiting after your treatment, we encourage you to take your nausea medication     If you develop nausea and vomiting that is not controlled by your nausea medication, call the clinic.   BELOW ARE SYMPTOMS THAT SHOULD BE REPORTED IMMEDIATELY:  *FEVER GREATER THAN 100.5 F  *CHILLS WITH OR WITHOUT FEVER  NAUSEA AND VOMITING THAT IS NOT CONTROLLED WITH YOUR NAUSEA MEDICATION  *UNUSUAL SHORTNESS OF BREATH  *UNUSUAL BRUISING OR BLEEDING  TENDERNESS IN MOUTH AND THROAT WITH OR WITHOUT PRESENCE OF ULCERS  *URINARY PROBLEMS  *BOWEL PROBLEMS  UNUSUAL RASH Items with * indicate a potential emergency and should be followed up as soon as possible.  Feel free to call the clinic you have any questions or concerns. The clinic phone number is [phone].  Please show the CHEMO ALERT CARD at check-in to the Emergency Department and triage nurse.   

## 2017-04-01 NOTE — Telephone Encounter (Signed)
Scheduled appt per 6/4 LOS - Gave patient AVS and calender.

## 2017-04-15 ENCOUNTER — Telehealth: Payer: Self-pay | Admitting: Internal Medicine

## 2017-04-15 ENCOUNTER — Other Ambulatory Visit (HOSPITAL_BASED_OUTPATIENT_CLINIC_OR_DEPARTMENT_OTHER): Payer: 59

## 2017-04-15 ENCOUNTER — Ambulatory Visit (HOSPITAL_BASED_OUTPATIENT_CLINIC_OR_DEPARTMENT_OTHER): Payer: 59 | Admitting: Internal Medicine

## 2017-04-15 ENCOUNTER — Encounter: Payer: Self-pay | Admitting: Internal Medicine

## 2017-04-15 ENCOUNTER — Ambulatory Visit (HOSPITAL_BASED_OUTPATIENT_CLINIC_OR_DEPARTMENT_OTHER): Payer: 59

## 2017-04-15 VITALS — BP 158/80 | HR 86 | Temp 97.7°F | Resp 18 | Ht 70.0 in | Wt 119.1 lb

## 2017-04-15 DIAGNOSIS — E440 Moderate protein-calorie malnutrition: Secondary | ICD-10-CM

## 2017-04-15 DIAGNOSIS — C3491 Malignant neoplasm of unspecified part of right bronchus or lung: Secondary | ICD-10-CM | POA: Diagnosis not present

## 2017-04-15 DIAGNOSIS — Z5112 Encounter for antineoplastic immunotherapy: Secondary | ICD-10-CM

## 2017-04-15 DIAGNOSIS — Z79899 Other long term (current) drug therapy: Secondary | ICD-10-CM

## 2017-04-15 DIAGNOSIS — Z5111 Encounter for antineoplastic chemotherapy: Secondary | ICD-10-CM

## 2017-04-15 LAB — COMPREHENSIVE METABOLIC PANEL
ALBUMIN: 3.9 g/dL (ref 3.5–5.0)
ALK PHOS: 98 U/L (ref 40–150)
ALT: 22 U/L (ref 0–55)
AST: 21 U/L (ref 5–34)
Anion Gap: 12 mEq/L — ABNORMAL HIGH (ref 3–11)
BILIRUBIN TOTAL: 0.33 mg/dL (ref 0.20–1.20)
BUN: 27.6 mg/dL — AB (ref 7.0–26.0)
CO2: 25 mEq/L (ref 22–29)
Calcium: 9.8 mg/dL (ref 8.4–10.4)
Chloride: 107 mEq/L (ref 98–109)
Creatinine: 1 mg/dL (ref 0.7–1.3)
EGFR: 76 mL/min/{1.73_m2} — ABNORMAL LOW (ref >90)
GLUCOSE: 93 mg/dL (ref 70–140)
POTASSIUM: 4 meq/L (ref 3.5–5.1)
SODIUM: 144 meq/L (ref 136–145)
TOTAL PROTEIN: 7.6 g/dL (ref 6.4–8.3)

## 2017-04-15 LAB — CBC WITH DIFFERENTIAL/PLATELET
BASO%: 0.4 % (ref 0.0–2.0)
Basophils Absolute: 0 10*3/uL (ref 0.0–0.1)
EOS%: 2.2 % (ref 0.0–7.0)
Eosinophils Absolute: 0.2 10*3/uL (ref 0.0–0.5)
HCT: 41.6 % (ref 38.4–49.9)
HEMOGLOBIN: 13.5 g/dL (ref 13.0–17.1)
LYMPH%: 10.4 % — ABNORMAL LOW (ref 14.0–49.0)
MCH: 29.8 pg (ref 27.2–33.4)
MCHC: 32.5 g/dL (ref 32.0–36.0)
MCV: 91.8 fL (ref 79.3–98.0)
MONO#: 0.6 10*3/uL (ref 0.1–0.9)
MONO%: 8.3 % (ref 0.0–14.0)
NEUT%: 78.7 % — ABNORMAL HIGH (ref 39.0–75.0)
NEUTROS ABS: 5.9 10*3/uL (ref 1.5–6.5)
Platelets: 181 10*3/uL (ref 140–400)
RBC: 4.53 10*6/uL (ref 4.20–5.82)
RDW: 14.5 % (ref 11.0–14.6)
WBC: 7.6 10*3/uL (ref 4.0–10.3)
lymph#: 0.8 10*3/uL — ABNORMAL LOW (ref 0.9–3.3)

## 2017-04-15 LAB — TSH: TSH: 0.652 m(IU)/L (ref 0.320–4.118)

## 2017-04-15 MED ORDER — SODIUM CHLORIDE 0.9 % IV SOLN
240.0000 mg | Freq: Once | INTRAVENOUS | Status: AC
  Administered 2017-04-15: 240 mg via INTRAVENOUS
  Filled 2017-04-15: qty 24

## 2017-04-15 MED ORDER — SODIUM CHLORIDE 0.9 % IV SOLN
Freq: Once | INTRAVENOUS | Status: AC
  Administered 2017-04-15: 15:00:00 via INTRAVENOUS

## 2017-04-15 NOTE — Progress Notes (Signed)
[place] [phone]   [phone]  OFFICE PROGRESS NOTE  Aura Dials, MD [phone] [address] [place] [address]  DIAGNOSIS: Metastatic non-small cell lung cancer, adenocarcinoma initially diagnosed as unresectable stage IIIa (T2a, N2, M0) in July 2016.  [facility] One molecular studies reveal that he was positive for [place], Dexter, and STAG2 [id number]. He was negative for: EGFR, ALK, BRAF, MET, RET, ERBB2 and ROS1  PRIOR THERAPY: 1) Concurrent chemoradiation with chemotherapy in the form of weekly carboplatin for AUC 2 paclitaxel 45 mg/m given concurrent with radiation therapy. First cycle started 05/09/2015. Status post 7 cycles. 2) Consolidation chemotherapy with carboplatin for AUC of 5 on day 1 and gemcitabine 1000 MG/M2 on days 1 and 8 every 3 weeks. First dose 08/15/2015. Discontinued secondary to intolerance.  CURRENT THERAPY: Immunotherapy with Nivolumab 240 mg IV every 2 weeks, status post 30 cycles.  INTERVAL HISTORY: Gabriel [id number].[id number]. male returns to the clinic today for follow-up visit accompanied by his daughter [name] The patient is feeling fine today with no specific complaints he enjoyed his vacation at the beach. He gained 3 pounds since his last visit. He denied having any chest pain, shortness breath, cough or hemoptysis. He had some muscle spasm and he took some flexibility with improvement of his condition. He denied having any chest pain, shortness breath, cough or hemoptysis. He has no fever or chills. He is here today for evaluation before starting cycle #31.   MEDICAL HISTORY: Past Medical History:  Diagnosis Date  . [place]  . Constipation    AFTER ANESTHESIA  . Difficulty sleeping   . History of nonmelanoma skin cancer   . Hypertension 10/30/2016  . Inguinal hernia   . Lung mass    right / HAS HAD RADIATION AND CHEMO FOR LUNG CANCER  . Pneumothorax on  right 08/24/2015  . Productive cough    "DUE TO RADIATION"  . Radiation 05/11/15-06/21/15   NSCLCA/right lung  . Smoking 1/2 pack a day or less 09/17/2016  . Spontaneous pneumothorax MANY YRS AGO AND AGAIN 04/04/15   HISTORY OF (ONLY)  . Weight loss 04/01/2017    ALLERGIES:  is allergic to aleve [naproxen].  MEDICATIONS:  Current Outpatient Prescriptions  Medication Sig Dispense Refill  . acetaminophen (TYLENOL) 325 MG tablet 2 tablets    . Aclidinium Bromide (TUDORZA PRESSAIR) 400 MCG/ACT AEPB Inhale 1 puff into the lungs 2 (two) times daily. 30 each 11  . albuterol (PROVENTIL HFA) 108 (90 Base) MCG/ACT inhaler 1-2 puffs as needed    . Arginine (RA L-ARGININE) 1000 MG TABS Take 500 mg by mouth daily.     . Ascorbic Acid (VITAMIN C) 1000 MG tablet Take 1,000 mg by mouth daily. Pt takes 2 5090mtabs    . B Complex Vitamins (B COMPLEX PO) Take 1 capsule by mouth daily.     . budesonide-formoterol (SYMBICORT) 160-4.5 MCG/ACT inhaler 2 puffs BID 1 Inhaler 5  . CALCIUM CITRATE PO Take 1 tablet by mouth daily.    . Cyanocobalamin (VITAMIN B 12 PO) Take 1 tablet by mouth daily.    . DUREZOL 0.05 % EMUL Place 1 drop into the right eye 2 (two) times daily.    . ferrous sulfate 325 (65 FE) MG tablet Take 325 mg by mouth daily with breakfast.    . guaiFENesin (MUCINEX) 600 MG 12 hr tablet Take 1 tablet (600  mg total) by mouth 2 (two) times daily as needed.    Marland Kitchen ketorolac (ACULAR) 0.5 % ophthalmic solution Place 1 drop into the right eye 2 (two) times daily.    . magic mouthwash SOLN Take 5 mLs by mouth 4 (four) times daily as needed.    . magnesium 30 MG tablet Take 30 mg by mouth daily.    . methylphenidate (RITALIN) 5 MG tablet Take 1 tablet (5 mg total) by mouth 2 (two) times daily with breakfast and lunch. 60 tablet 0  . Multiple Vitamin (MULTI VITAMIN DAILY PO) Take 1 tablet by mouth daily.    . Omega-3 Fatty Acids (FISH OIL) 1000 MG CAPS Take 1,000 mg by mouth daily.    . ondansetron (ZOFRAN)  8 MG tablet Take 1 tablet (8 mg total) by mouth every 8 (eight) hours as needed for nausea or vomiting. 20 tablet 0  . prochlorperazine (COMPAZINE) 10 MG tablet Take 1 tablet (10 mg total) by mouth every 6 (six) hours as needed for nausea or vomiting. 30 tablet 0  . saccharomyces boulardii (FLORASTOR) 250 MG capsule Take 250 mg by mouth 2 (two) times daily.    . vitamin A 10000 UNIT capsule 1 capsule with food or milk     No current facility-administered medications for this visit.     SURGICAL HISTORY:  Past Surgical History:  Procedure Laterality Date  . CHEST TUBE INSERTION Right 08/02/2015   Procedure: INSERTION OF SECOND RIGHT CHEST TUBE WITH FLUROSCOPY;  Surgeon: Grace Isaac, MD;  Location: [facility];  Service: Thoracic;  Laterality: Right;  . CHEST TUBE INSERTION  07/28/2015  . COLONOSCOPY W/ BIOPSIES AND POLYPECTOMY    . HERNIA REPAIR  2011  . INGUINAL HERNIA REPAIR Left 07/15/2015   Procedure: OPEN LEFT INGUINAL HERNIA REPAIR;  Surgeon: Johnathan Hausen, MD;  Location: [facility];  Service: General;  Laterality: Left;  With MESH  . TONSILLECTOMY    . VIDEO BRONCHOSCOPY WITH ENDOBRONCHIAL ULTRASOUND N/A 04/18/2015   Procedure: VIDEO BRONCHOSCOPY WITH ENDOBRONCHIAL ULTRASOUND;  Surgeon: Grace Isaac, MD;  Location: [facility];  Service: Thoracic;  Laterality: N/A;  . VIDEO BRONCHOSCOPY WITH INSERTION OF INTERBRONCHIAL VALVE (IBV) N/A 08/02/2015   Procedure: VIDEO BRONCHOSCOPY WITH ATTEMPTED INSERTION OF INTERBRONCHIAL VALVE (IBV) WITH FLUROSCOPY;  Surgeon: Grace Isaac, MD;  Location: [facility];  Service: Thoracic;  Laterality: N/A;  . WISDOM TOOTH EXTRACTION      REVIEW OF SYSTEMS:  A comprehensive review of systems was negative.   PHYSICAL EXAMINATION: General appearance: alert, cooperative and no distress Head: Normocephalic, without obvious abnormality, atraumatic Neck: no adenopathy, no JVD, supple, symmetrical, trachea midline and thyroid not enlarged, symmetric, no  tenderness/mass/nodules Lymph nodes: Cervical, supraclavicular, and axillary nodes normal. Resp: wheezes RUL Back: symmetric, no curvature. ROM normal. No CVA tenderness. Cardio: regular rate and rhythm, S1, S2 normal, no murmur, click, rub or gallop GI: soft, non-tender; bowel sounds normal; no masses,  no organomegaly Extremities: extremities normal, atraumatic, no cyanosis or edema  ECOG PERFORMANCE STATUS: 1 - Symptomatic but completely ambulatory  Blood pressure (!) 158/80, pulse 86, temperature 97.7 F (36.5 C), temperature source Oral, resp. rate 18, height 5' 10"  (1.778 m), weight 119 lb 1.6 oz (54 kg), SpO2 100 %.  LABORATORY DATA: Lab Results  Component Value Date   WBC 7.6 04/15/2017   HGB 13.5 04/15/2017   HCT 41.6 04/15/2017   MCV 91.8 04/15/2017   PLT 181 04/15/2017      Chemistry  Component Value Date/Time   NA 138 04/01/2017 1231   K 4.1 04/01/2017 1231   CL 102 09/12/2015 0410   CO2 25 04/01/2017 1231   BUN 31.4 (H) 04/01/2017 1231   CREATININE 1.0 04/01/2017 1231      Component Value Date/Time   CALCIUM 10.2 04/01/2017 1231   ALKPHOS 102 04/01/2017 1231   AST 25 04/01/2017 1231   ALT 27 04/01/2017 1231   BILITOT 0.39 04/01/2017 1231       RADIOGRAPHIC STUDIES: Ct Chest W Contrast  Result Date: 03/29/2017 CLINICAL DATA:  Stage III non-small cell carcinoma of the right lung. EXAM: CT CHEST, ABDOMEN, AND PELVIS WITH CONTRAST TECHNIQUE: Multidetector CT imaging of the chest, abdomen and pelvis was performed following the standard protocol during bolus administration of intravenous contrast. CONTRAST:  15m ISOVUE-300 IOPAMIDOL (ISOVUE-300) INJECTION 61% COMPARISON:  Chest CT 02/01/2017.  Abdomen pelvis CT 08/31/2016 FINDINGS: CT CHEST FINDINGS Cardiovascular: The heart size is normal. No pericardial effusion. Coronary artery calcification is noted. Atherosclerotic calcification is noted in the wall of the thoracic aorta. Mediastinum/Nodes: 9 mm short  axis high right paratracheal lymph node not substantially changed in the interval. No left hilar lymphadenopathy. Stable appearance abnormal soft tissue in the right hilum, most likely treatment related. Lungs/Pleura: Marked volume loss right hemithorax, stable in the interval. Dense consolidation with bronchiectasis right upper lung is similar to prior. Architectural distortion and thickening of the major fissure at the right base also not substantially changed. 10 x 13 mm anterior subpleural spiculated nodule in the right lung (image 113 series for) is similar to prior. 6 mm posterior subpleural nodule seen in the right lung base (image 108) is also unchanged. Clustered nodularity in the right lateral costophrenic sulcus (image 128 series 4) similar to previous. The 8 mm medial and posterior subpleural right lower lobe nodule is stable. 16 mm central area of architectural distortion in the left lower lobe (image 143) is stable at 15 mm. Scattered ground-glass nodular opacities are seen in both lungs, similar to prior study. The solid 7 mm nodule superior segment left lower lobe measures 6 mm today. 10 mm irregular nodule in the left upper lobe (image 50) is stable. Changes of emphysema. No substantial pleural effusion. Musculoskeletal: Bone windows reveal no worrisome lytic or sclerotic osseous lesions. CT ABDOMEN PELVIS FINDINGS Hepatobiliary: No focal abnormality within the liver parenchyma. There is no evidence for gallstones, gallbladder wall thickening, or pericholecystic fluid. Mild prominence extrahepatic bile ducts similar to prior. Pancreas: No focal mass lesion. No dilatation of the main duct. No intraparenchymal cyst. No peripancreatic edema. Spleen: No splenomegaly. No focal mass lesion. Adrenals/Urinary Tract: 2 cm right adrenal adenoma is unchanged. Left adrenal gland unremarkable. Kidneys unremarkable. No evidence for hydroureter. The urinary bladder appears normal for the degree of distention.  Stomach/Bowel: Stomach is nondistended. No gastric wall thickening. No evidence of outlet obstruction. Duodenum is normally positioned as is the ligament of Treitz. No small bowel wall thickening. No small bowel dilatation. The terminal ileum is normal. The appendix is not visualized, but there is no edema or inflammation in the region of the cecum. Diverticular changes are noted in the left colon without evidence of diverticulitis. Vascular/Lymphatic: There is abdominal aortic atherosclerosis without aneurysm. There is no gastrohepatic or hepatoduodenal ligament lymphadenopathy. No intraperitoneal or retroperitoneal lymphadenopathy. No pelvic sidewall lymphadenopathy. Reproductive: Prostate gland is enlarged. Other: No substantial intraperitoneal free fluid. Musculoskeletal: Bone windows reveal no worrisome lytic or sclerotic osseous lesions. T8 compression fracture similar to  prior. IMPRESSION: 1. 16 mm sclerotic lesion posterior right iliac bone has progressed from 8 mm previous. Metastatic disease is a concern. 2. Otherwise no substantial interval change in exam. Marked volume loss right hemithorax with dense consolidation bronchiectasis in the right upper lobe suggesting prior therapy. 3. The patchy and nodular consolidation seen scattered in the right lower lobe is similar to prior and multiple bilateral pulmonary nodular opacity show no appreciable interval change. 4. Stable borderline to mild mediastinal lymphadenopathy. 5. Abdominal aortic atherosclerosis. 6. Emphysema. 7. 2 cm right adrenal adenoma. Electronically Signed   By: Misty Stanley M.D.   On: 03/29/2017 19:26   Ct Abdomen Pelvis W Contrast  Result Date: 03/29/2017 CLINICAL DATA:  Stage III non-small cell carcinoma of the right lung. EXAM: CT CHEST, ABDOMEN, AND PELVIS WITH CONTRAST TECHNIQUE: Multidetector CT imaging of the chest, abdomen and pelvis was performed following the standard protocol during bolus administration of intravenous  contrast. CONTRAST:  155m ISOVUE-300 IOPAMIDOL (ISOVUE-300) INJECTION 61% COMPARISON:  Chest CT 02/01/2017.  Abdomen pelvis CT 08/31/2016 FINDINGS: CT CHEST FINDINGS Cardiovascular: The heart size is normal. No pericardial effusion. Coronary artery calcification is noted. Atherosclerotic calcification is noted in the wall of the thoracic aorta. Mediastinum/Nodes: 9 mm short axis high right paratracheal lymph node not substantially changed in the interval. No left hilar lymphadenopathy. Stable appearance abnormal soft tissue in the right hilum, most likely treatment related. Lungs/Pleura: Marked volume loss right hemithorax, stable in the interval. Dense consolidation with bronchiectasis right upper lung is similar to prior. Architectural distortion and thickening of the major fissure at the right base also not substantially changed. 10 x 13 mm anterior subpleural spiculated nodule in the right lung (image 113 series for) is similar to prior. 6 mm posterior subpleural nodule seen in the right lung base (image 108) is also unchanged. Clustered nodularity in the right lateral costophrenic sulcus (image 128 series 4) similar to previous. The 8 mm medial and posterior subpleural right lower lobe nodule is stable. 16 mm central area of architectural distortion in the left lower lobe (image 143) is stable at 15 mm. Scattered ground-glass nodular opacities are seen in both lungs, similar to prior study. The solid 7 mm nodule superior segment left lower lobe measures 6 mm today. 10 mm irregular nodule in the left upper lobe (image 50) is stable. Changes of emphysema. No substantial pleural effusion. Musculoskeletal: Bone windows reveal no worrisome lytic or sclerotic osseous lesions. CT ABDOMEN PELVIS FINDINGS Hepatobiliary: No focal abnormality within the liver parenchyma. There is no evidence for gallstones, gallbladder wall thickening, or pericholecystic fluid. Mild prominence extrahepatic bile ducts similar to prior.  Pancreas: No focal mass lesion. No dilatation of the main duct. No intraparenchymal cyst. No peripancreatic edema. Spleen: No splenomegaly. No focal mass lesion. Adrenals/Urinary Tract: 2 cm right adrenal adenoma is unchanged. Left adrenal gland unremarkable. Kidneys unremarkable. No evidence for hydroureter. The urinary bladder appears normal for the degree of distention. Stomach/Bowel: Stomach is nondistended. No gastric wall thickening. No evidence of outlet obstruction. Duodenum is normally positioned as is the ligament of Treitz. No small bowel wall thickening. No small bowel dilatation. The terminal ileum is normal. The appendix is not visualized, but there is no edema or inflammation in the region of the cecum. Diverticular changes are noted in the left colon without evidence of diverticulitis. Vascular/Lymphatic: There is abdominal aortic atherosclerosis without aneurysm. There is no gastrohepatic or hepatoduodenal ligament lymphadenopathy. No intraperitoneal or retroperitoneal lymphadenopathy. No pelvic sidewall lymphadenopathy. Reproductive: Prostate  gland is enlarged. Other: No substantial intraperitoneal free fluid. Musculoskeletal: Bone windows reveal no worrisome lytic or sclerotic osseous lesions. T8 compression fracture similar to prior. IMPRESSION: 1. 16 mm sclerotic lesion posterior right iliac bone has progressed from 8 mm previous. Metastatic disease is a concern. 2. Otherwise no substantial interval change in exam. Marked volume loss right hemithorax with dense consolidation bronchiectasis in the right upper lobe suggesting prior therapy. 3. The patchy and nodular consolidation seen scattered in the right lower lobe is similar to prior and multiple bilateral pulmonary nodular opacity show no appreciable interval change. 4. Stable borderline to mild mediastinal lymphadenopathy. 5. Abdominal aortic atherosclerosis. 6. Emphysema. 7. 2 cm right adrenal adenoma. Electronically Signed   By: Misty Stanley M.D.   On: 03/29/2017 19:26    ASSESSMENT AND PLAN: This is a very pleasant 76 years old white male with metastatic non-small cell lung cancer, adenocarcinoma status post short course of concurrent chemoradiation with weekly carboplatin and paclitaxel followed by 1 cycle of consolidation chemotherapy discontinued secondary to entrance and he is currently on treatment with immunotherapy with Nivolumab status post 30 cycles. He is tolerating this treatment much better. I recommended for him to proceed with cycle #31 today as scheduled. The patient would come back for follow-up visit in 2 weeks for evaluation before starting cycle #32. He was advised to call immediately if he has any concerning symptoms in the interval. The patient voices understanding of current disease status and treatment options and is in agreement with the current care plan. All questions were answered. The patient knows to call the clinic with any problems, questions or concerns. We can certainly see the patient much sooner if necessary.  Disclaimer: This note was dictated with voice recognition software. Similar sounding words can inadvertently be transcribed and may not be corrected upon review.

## 2017-04-15 NOTE — Telephone Encounter (Signed)
Scheduled appt per 6/18 los - patient to get new schedule next visit.

## 2017-04-29 ENCOUNTER — Other Ambulatory Visit (HOSPITAL_BASED_OUTPATIENT_CLINIC_OR_DEPARTMENT_OTHER): Payer: 59

## 2017-04-29 ENCOUNTER — Other Ambulatory Visit: Payer: 59

## 2017-04-29 ENCOUNTER — Ambulatory Visit (HOSPITAL_BASED_OUTPATIENT_CLINIC_OR_DEPARTMENT_OTHER): Payer: 59 | Admitting: Internal Medicine

## 2017-04-29 ENCOUNTER — Ambulatory Visit: Payer: 59 | Admitting: Internal Medicine

## 2017-04-29 ENCOUNTER — Telehealth: Payer: Self-pay | Admitting: Internal Medicine

## 2017-04-29 ENCOUNTER — Ambulatory Visit: Payer: 59

## 2017-04-29 ENCOUNTER — Ambulatory Visit (HOSPITAL_BASED_OUTPATIENT_CLINIC_OR_DEPARTMENT_OTHER): Payer: 59

## 2017-04-29 ENCOUNTER — Encounter: Payer: Self-pay | Admitting: Internal Medicine

## 2017-04-29 VITALS — BP 168/91 | HR 92

## 2017-04-29 VITALS — BP 171/84 | HR 87 | Temp 97.6°F | Resp 18 | Ht 70.0 in | Wt 117.2 lb

## 2017-04-29 DIAGNOSIS — C3491 Malignant neoplasm of unspecified part of right bronchus or lung: Secondary | ICD-10-CM

## 2017-04-29 DIAGNOSIS — Z5112 Encounter for antineoplastic immunotherapy: Secondary | ICD-10-CM | POA: Diagnosis not present

## 2017-04-29 DIAGNOSIS — I10 Essential (primary) hypertension: Secondary | ICD-10-CM

## 2017-04-29 DIAGNOSIS — C3411 Malignant neoplasm of upper lobe, right bronchus or lung: Secondary | ICD-10-CM

## 2017-04-29 LAB — COMPREHENSIVE METABOLIC PANEL
ALBUMIN: 3.9 g/dL (ref 3.5–5.0)
ALK PHOS: 103 U/L (ref 40–150)
ALT: 19 U/L (ref 0–55)
AST: 20 U/L (ref 5–34)
Anion Gap: 10 mEq/L (ref 3–11)
BUN: 29 mg/dL — AB (ref 7.0–26.0)
CALCIUM: 10.1 mg/dL (ref 8.4–10.4)
CO2: 28 mEq/L (ref 22–29)
CREATININE: 1 mg/dL (ref 0.7–1.3)
Chloride: 104 mEq/L (ref 98–109)
EGFR: 77 mL/min/{1.73_m2} — ABNORMAL LOW (ref >90)
Glucose: 82 mg/dl (ref 70–140)
POTASSIUM: 3.9 meq/L (ref 3.5–5.1)
Sodium: 142 mEq/L (ref 136–145)
Total Bilirubin: 0.31 mg/dL (ref 0.20–1.20)
Total Protein: 7.7 g/dL (ref 6.4–8.3)

## 2017-04-29 LAB — CBC WITH DIFFERENTIAL/PLATELET
BASO%: 1 % (ref 0.0–2.0)
BASOS ABS: 0.1 10*3/uL (ref 0.0–0.1)
EOS%: 2.1 % (ref 0.0–7.0)
Eosinophils Absolute: 0.2 10*3/uL (ref 0.0–0.5)
HEMATOCRIT: 43.8 % (ref 38.4–49.9)
HEMOGLOBIN: 14.5 g/dL (ref 13.0–17.1)
LYMPH#: 1 10*3/uL (ref 0.9–3.3)
LYMPH%: 12.5 % — ABNORMAL LOW (ref 14.0–49.0)
MCH: 29.9 pg (ref 27.2–33.4)
MCHC: 33.2 g/dL (ref 32.0–36.0)
MCV: 90 fL (ref 79.3–98.0)
MONO#: 0.6 10*3/uL (ref 0.1–0.9)
MONO%: 7.6 % (ref 0.0–14.0)
NEUT#: 6.3 10*3/uL (ref 1.5–6.5)
NEUT%: 76.8 % — ABNORMAL HIGH (ref 39.0–75.0)
PLATELETS: 215 10*3/uL (ref 140–400)
RBC: 4.87 10*6/uL (ref 4.20–5.82)
RDW: 14.1 % (ref 11.0–14.6)
WBC: 8.2 10*3/uL (ref 4.0–10.3)

## 2017-04-29 MED ORDER — SODIUM CHLORIDE 0.9 % IV SOLN
Freq: Once | INTRAVENOUS | Status: AC
  Administered 2017-04-29: 15:00:00 via INTRAVENOUS

## 2017-04-29 MED ORDER — SODIUM CHLORIDE 0.9 % IV SOLN
240.0000 mg | Freq: Once | INTRAVENOUS | Status: AC
  Administered 2017-04-29: 240 mg via INTRAVENOUS
  Filled 2017-04-29: qty 24

## 2017-04-29 NOTE — Progress Notes (Signed)
[place] [phone]   [phone]  OFFICE PROGRESS NOTE  Gabriel Dials, MD [phone] N. [address], [address] [place] [address]  DIAGNOSIS: Metastatic non-small cell lung cancer, adenocarcinoma initially diagnosed as unresectable stage IIIa (T2a, N2, M0) in July 2016.  Foundation One molecular studies reveal that he was positive for [place], [place], and STAG2 S1063f*20. He was negative for: EGFR, ALK, BRAF, MET, RET, ERBB2 and ROS1  PRIOR THERAPY: 1) Concurrent chemoradiation with chemotherapy in the form of weekly carboplatin for AUC 2 paclitaxel 45 mg/m given concurrent with radiation therapy. First cycle started 05/09/2015. Status post 7 cycles. 2) Consolidation chemotherapy with carboplatin for AUC of 5 on day 1 and gemcitabine 1000 MG/M2 on days 1 and 8 every 3 weeks. First dose 08/15/2015. Discontinued secondary to intolerance.  CURRENT THERAPY: Immunotherapy with Nivolumab 240 mg IV every 2 weeks, status post 31 cycles.  INTERVAL HISTORY: Gabriel SERIO89y.o. male returns to the clinic today for follow-up visit accompanied by [name] The patient is feeling fine today with no specific complaints. His blood pressure was high because he was a little bit anxious today. He denied having any chest pain but has shortness breath with exertion was no cough or hemoptysis. He has no fever or chills. He denied having any nausea, vomiting, diarrhea or constipation. He is here today for evaluation before starting cycle #32.  MEDICAL HISTORY: Past Medical History:  Diagnosis Date  . [place]    [place] cancer  . Constipation    AFTER ANESTHESIA  . Difficulty sleeping   . History of nonmelanoma skin cancer   . Hypertension 10/30/2016  . Inguinal hernia   . Lung mass    right / HAS HAD RADIATION AND CHEMO FOR LUNG CANCER  . Pneumothorax on right 08/24/2015  . Productive cough    "DUE TO RADIATION"  . Radiation  05/11/15-06/21/15   NSCLCA/right lung  . Smoking 1/2 pack a day or less 09/17/2016  . Spontaneous pneumothorax MANY YRS AGO AND AGAIN 04/04/15   HISTORY OF (ONLY)  . Weight loss 04/01/2017    ALLERGIES:  is allergic to aleve [naproxen].  MEDICATIONS:  Current Outpatient Prescriptions  Medication Sig Dispense Refill  . acetaminophen (TYLENOL) 325 MG tablet 2 tablets    . Aclidinium Bromide (TUDORZA PRESSAIR) 400 MCG/ACT AEPB Inhale 1 puff into the lungs 2 (two) times daily. 30 each 11  . albuterol (PROVENTIL HFA) 108 (90 Base) MCG/ACT inhaler 1-2 puffs as needed    . Arginine (RA L-ARGININE) 1000 MG TABS Take 500 mg by mouth daily.     . Ascorbic Acid (VITAMIN C) 1000 MG tablet Take 1,000 mg by mouth daily. Pt takes 2 5019mtabs    . B Complex Vitamins (B COMPLEX PO) Take 1 capsule by mouth daily.     . budesonide-formoterol (SYMBICORT) 160-4.5 MCG/ACT inhaler 2 puffs BID 1 Inhaler 5  . CALCIUM CITRATE PO Take 1 tablet by mouth daily.    . Cyanocobalamin (VITAMIN B 12 PO) Take 1 tablet by mouth daily.    . DUREZOL 0.05 % EMUL Place 1 drop into the right eye 2 (two) times daily.    . ferrous sulfate 325 (65 FE) MG tablet Take 325 mg by mouth daily with breakfast.    . guaiFENesin (MUCINEX) 600 MG 12 hr tablet Take 1 tablet (600 mg total) by mouth 2 (two) times daily as needed.    .Marland Kitchen  ketorolac (ACULAR) 0.5 % ophthalmic solution Place 1 drop into the right eye 2 (two) times daily.    . magic mouthwash SOLN Take 5 mLs by mouth 4 (four) times daily as needed.    . magnesium 30 MG tablet Take 30 mg by mouth daily.    . methylphenidate (RITALIN) 5 MG tablet Take 1 tablet (5 mg total) by mouth 2 (two) times daily with breakfast and lunch. 60 tablet 0  . Multiple Vitamin (MULTI VITAMIN DAILY PO) Take 1 tablet by mouth daily.    . Omega-3 Fatty Acids (FISH OIL) 1000 MG CAPS Take 1,000 mg by mouth daily.    . ondansetron (ZOFRAN) 8 MG tablet Take 1 tablet (8 mg total) by mouth every 8 (eight) hours as  needed for nausea or vomiting. 20 tablet 0  . prochlorperazine (COMPAZINE) 10 MG tablet Take 1 tablet (10 mg total) by mouth every 6 (six) hours as needed for nausea or vomiting. 30 tablet 0  . saccharomyces boulardii (FLORASTOR) 250 MG capsule Take 250 mg by mouth 2 (two) times daily.    . vitamin A 10000 UNIT capsule 1 capsule with food or milk     No current facility-administered medications for this visit.     SURGICAL HISTORY:  Past Surgical History:  Procedure Laterality Date  . CHEST TUBE INSERTION Right 08/02/2015   Procedure: INSERTION OF SECOND RIGHT CHEST TUBE WITH FLUROSCOPY;  Surgeon: Grace Isaac, MD;  Location: [facility];  Service: Thoracic;  Laterality: Right;  . CHEST TUBE INSERTION  07/28/2015  . COLONOSCOPY W/ BIOPSIES AND POLYPECTOMY    . HERNIA REPAIR  2011  . INGUINAL HERNIA REPAIR Left 07/15/2015   Procedure: OPEN LEFT INGUINAL HERNIA REPAIR;  Surgeon: Johnathan Hausen, MD;  Location: [facility];  Service: General;  Laterality: Left;  With MESH  . TONSILLECTOMY    . VIDEO BRONCHOSCOPY WITH ENDOBRONCHIAL ULTRASOUND N/A 04/18/2015   Procedure: VIDEO BRONCHOSCOPY WITH ENDOBRONCHIAL ULTRASOUND;  Surgeon: Grace Isaac, MD;  Location: [facility];  Service: Thoracic;  Laterality: N/A;  . VIDEO BRONCHOSCOPY WITH INSERTION OF INTERBRONCHIAL VALVE (IBV) N/A 08/02/2015   Procedure: VIDEO BRONCHOSCOPY WITH ATTEMPTED INSERTION OF INTERBRONCHIAL VALVE (IBV) WITH FLUROSCOPY;  Surgeon: Grace Isaac, MD;  [facility];  Service: Thoracic;  Laterality: N/A;  . WISDOM TOOTH EXTRACTION      REVIEW OF SYSTEMS:  A comprehensive review of systems was negative.   PHYSICAL EXAMINATION: General appearance: alert, cooperative and no distress Head: Normocephalic, without obvious abnormality, atraumatic Neck: no adenopathy, no JVD, supple, symmetrical, trachea midline and thyroid not enlarged, symmetric, no tenderness/mass/nodules Lymph nodes: Cervical, supraclavicular, and axillary nodes  normal. Resp: wheezes RUL Back: symmetric, no curvature. ROM normal. No CVA tenderness. Cardio: regular rate and rhythm, S1, S2 normal, no murmur, click, rub or gallop GI: soft, non-tender; bowel sounds normal; no masses,  no organomegaly Extremities: extremities normal, atraumatic, no cyanosis or edema  ECOG PERFORMANCE STATUS: 1 - Symptomatic but completely ambulatory  Blood pressure (!) 171/84, pulse 87, temperature 97.6 F (36.4 C), temperature source Oral, resp. rate 18, height _0  (1.778 m), weight 117 lb 3.2 oz (53.2 kg), SpO2 98 %.  LABORATORY DATA: Lab Results  Component Value Date   WBC 8.2 04/29/2017   HGB 14.5 04/29/2017   HCT 43.8 04/29/2017   MCV 90.0 04/29/2017   PLT 215 04/29/2017      Chemistry      Component Value Date/Time   NA 144 04/15/2017 1206  K 4.0 04/15/2017 1206   CL 102 09/12/2015 0410   CO2 25 04/15/2017 1206   BUN 27.6 (H) 04/15/2017 1206   CREATININE 1.0 04/15/2017 1206      Component Value Date/Time   CALCIUM 9.8 04/15/2017 1206   ALKPHOS 98 04/15/2017 1206   AST 21 04/15/2017 1206   ALT 22 04/15/2017 1206   BILITOT 0.33 04/15/2017 1206       RADIOGRAPHIC STUDIES: No results found.  ASSESSMENT AND PLAN: This is a very pleasant 76 years old white male with metastatic non-small cell lung cancer, adenocarcinoma status post short course of concurrent chemoradiation with weekly carboplatin and paclitaxel followed by 1 cycle of consolidation chemotherapy discontinued secondary to intolerance. The patient is currently undergoing treatment with immunotherapy with Nivolumab status post 31 cycle and has been tolerating his treatment well. I recommended for him to proceed with cycle #32 today and the patient would come back for follow-up visit in 2 weeks for reevaluation before the next dose of his treatment. For hypertension, I strongly recommend for the patient to monitor his blood pressure closely at home and if it stays elevated to consult  with his primary care physician. He was advised to call immediately if he has any concerning symptoms in the interval. The patient voices understanding of current disease status and treatment options and is in agreement with the current care plan. All questions were answered. The patient knows to call the clinic with any problems, questions or concerns. We can certainly see the patient much sooner if necessary.  Disclaimer: This note was dictated with voice recognition software. Similar sounding words can inadvertently be transcribed and may not be corrected upon review.

## 2017-04-29 NOTE — Patient Instructions (Signed)
[facility] Discharge Instructions for Patients Receiving Chemotherapy  Today you received the following chemotherapy agents:  Nivolumab.  To help prevent nausea and vomiting after your treatment, we encourage you to take your nausea medication as directed.   If you develop nausea and vomiting that is not controlled by your nausea medication, call the clinic.   BELOW ARE SYMPTOMS THAT SHOULD BE REPORTED IMMEDIATELY:  *FEVER GREATER THAN 100.5 F  *CHILLS WITH OR WITHOUT FEVER  NAUSEA AND VOMITING THAT IS NOT CONTROLLED WITH YOUR NAUSEA MEDICATION  *UNUSUAL SHORTNESS OF BREATH  *UNUSUAL BRUISING OR BLEEDING  TENDERNESS IN MOUTH AND THROAT WITH OR WITHOUT PRESENCE OF ULCERS  *URINARY PROBLEMS  *BOWEL PROBLEMS  UNUSUAL RASH Items with * indicate a potential emergency and should be followed up as soon as possible.  Feel free to call the clinic you have any questions or concerns. The clinic phone number is [phone].  Please show the CHEMO ALERT CARD at check-in to the Emergency Department and triage nurse.   

## 2017-04-29 NOTE — Telephone Encounter (Signed)
Scheduled appt per 7/2 los - Gave patient AVS and calender per los.

## 2017-05-13 ENCOUNTER — Other Ambulatory Visit (HOSPITAL_BASED_OUTPATIENT_CLINIC_OR_DEPARTMENT_OTHER): Payer: 59

## 2017-05-13 ENCOUNTER — Encounter: Payer: Self-pay | Admitting: Internal Medicine

## 2017-05-13 ENCOUNTER — Ambulatory Visit (HOSPITAL_BASED_OUTPATIENT_CLINIC_OR_DEPARTMENT_OTHER): Payer: 59 | Admitting: Internal Medicine

## 2017-05-13 ENCOUNTER — Ambulatory Visit (HOSPITAL_BASED_OUTPATIENT_CLINIC_OR_DEPARTMENT_OTHER): Payer: 59

## 2017-05-13 VITALS — BP 169/84 | HR 94 | Temp 97.7°F | Resp 18 | Ht 70.0 in | Wt 117.0 lb

## 2017-05-13 DIAGNOSIS — C3491 Malignant neoplasm of unspecified part of right bronchus or lung: Secondary | ICD-10-CM

## 2017-05-13 DIAGNOSIS — R0602 Shortness of breath: Secondary | ICD-10-CM | POA: Diagnosis not present

## 2017-05-13 DIAGNOSIS — I10 Essential (primary) hypertension: Secondary | ICD-10-CM

## 2017-05-13 DIAGNOSIS — Z5112 Encounter for antineoplastic immunotherapy: Secondary | ICD-10-CM

## 2017-05-13 DIAGNOSIS — R05 Cough: Secondary | ICD-10-CM

## 2017-05-13 LAB — COMPREHENSIVE METABOLIC PANEL
ALBUMIN: 4 g/dL (ref 3.5–5.0)
ALK PHOS: 108 U/L (ref 40–150)
ALT: 21 U/L (ref 0–55)
ANION GAP: 12 meq/L — AB (ref 3–11)
AST: 20 U/L (ref 5–34)
BUN: 27.2 mg/dL — AB (ref 7.0–26.0)
CALCIUM: 10.1 mg/dL (ref 8.4–10.4)
CHLORIDE: 105 meq/L (ref 98–109)
CO2: 26 mEq/L (ref 22–29)
Creatinine: 1.1 mg/dL (ref 0.7–1.3)
EGFR: 63 mL/min/{1.73_m2} — AB (ref >90)
Glucose: 84 mg/dl (ref 70–140)
POTASSIUM: 4 meq/L (ref 3.5–5.1)
Sodium: 142 mEq/L (ref 136–145)
Total Bilirubin: 0.3 mg/dL (ref 0.20–1.20)
Total Protein: 7.8 g/dL (ref 6.4–8.3)

## 2017-05-13 LAB — CBC WITH DIFFERENTIAL/PLATELET
BASO%: 0.4 % (ref 0.0–2.0)
BASOS ABS: 0 10*3/uL (ref 0.0–0.1)
EOS ABS: 0.2 10*3/uL (ref 0.0–0.5)
EOS%: 2.5 % (ref 0.0–7.0)
HEMATOCRIT: 42.2 % (ref 38.4–49.9)
HGB: 13.9 g/dL (ref 13.0–17.1)
LYMPH%: 13.9 % — ABNORMAL LOW (ref 14.0–49.0)
MCH: 29.8 pg (ref 27.2–33.4)
MCHC: 32.9 g/dL (ref 32.0–36.0)
MCV: 90.6 fL (ref 79.3–98.0)
MONO#: 0.5 10*3/uL (ref 0.1–0.9)
MONO%: 6.7 % (ref 0.0–14.0)
NEUT#: 6 10*3/uL (ref 1.5–6.5)
NEUT%: 76.5 % — AB (ref 39.0–75.0)
PLATELETS: 209 10*3/uL (ref 140–400)
RBC: 4.66 10*6/uL (ref 4.20–5.82)
RDW: 14.3 % (ref 11.0–14.6)
WBC: 7.9 10*3/uL (ref 4.0–10.3)
lymph#: 1.1 10*3/uL (ref 0.9–3.3)

## 2017-05-13 MED ORDER — SODIUM CHLORIDE 0.9 % IV SOLN
Freq: Once | INTRAVENOUS | Status: AC
  Administered 2017-05-13: 15:00:00 via INTRAVENOUS

## 2017-05-13 MED ORDER — NIVOLUMAB CHEMO INJECTION 100 MG/10ML
240.0000 mg | Freq: Once | INTRAVENOUS | Status: AC
  Administered 2017-05-13: 240 mg via INTRAVENOUS
  Filled 2017-05-13: qty 24

## 2017-05-13 NOTE — Patient Instructions (Signed)
[facility] Discharge Instructions for Patients Receiving Chemotherapy  Today you received the following chemotherapy agents:  Nivolumab.  To help prevent nausea and vomiting after your treatment, we encourage you to take your nausea medication as directed.   If you develop nausea and vomiting that is not controlled by your nausea medication, call the clinic.   BELOW ARE SYMPTOMS THAT SHOULD BE REPORTED IMMEDIATELY:  *FEVER GREATER THAN 100.5 F  *CHILLS WITH OR WITHOUT FEVER  NAUSEA AND VOMITING THAT IS NOT CONTROLLED WITH YOUR NAUSEA MEDICATION  *UNUSUAL SHORTNESS OF BREATH  *UNUSUAL BRUISING OR BLEEDING  TENDERNESS IN MOUTH AND THROAT WITH OR WITHOUT PRESENCE OF ULCERS  *URINARY PROBLEMS  *BOWEL PROBLEMS  UNUSUAL RASH Items with * indicate a potential emergency and should be followed up as soon as possible.  Feel free to call the clinic you have any questions or concerns. The clinic phone number is [phone].  Please show the CHEMO ALERT CARD at check-in to the Emergency Department and triage nurse.   

## 2017-05-13 NOTE — Progress Notes (Signed)
[place] [phone]   [phone]  OFFICE PROGRESS NOTE  Aura Dials, MD [phone] [address]. [address] [place] [address]  DIAGNOSIS: Metastatic non-small cell lung cancer, adenocarcinoma initially diagnosed as unresectable stage IIIa (T2a, N2, M0) in July 2016.  Foundation One molecular studies reveal that he was positive for [place], [place], and STAG2 S107f*20. He was negative for: EGFR, ALK, BRAF, MET, RET, ERBB2 and ROS1  PRIOR THERAPY: 1) Concurrent chemoradiation with chemotherapy in the form of weekly carboplatin for AUC 2 paclitaxel 45 mg/m given concurrent with radiation therapy. First cycle started 05/09/2015. Status post 7 cycles. 2) Consolidation chemotherapy with carboplatin for AUC of 5 on day 1 and gemcitabine 1000 MG/M2 on days 1 and 8 every 3 weeks. First dose 08/15/2015. Discontinued secondary to intolerance.  CURRENT THERAPY: Immunotherapy with Nivolumab 240 mg IV every 2 weeks, status post 32 cycles.  INTERVAL HISTORY: Gabriel [id number].o. male returns to the clinic today for follow-up visit accompanied by his daughter [name] The patient is feeling fine today with no specific complaints except for the baseline shortness of breath and intermittent cough. The patient denied having any chest pain or hemoptysis. He denied having any weight loss or night sweats. He has no nausea, vomiting, diarrhea or constipation. He continues to tolerate his treatment with Nivolumab fairly well. He has today for evaluation before starting cycle #33.   MEDICAL HISTORY: Past Medical History:  Diagnosis Date  . [place]    [place] cancer  . Constipation    AFTER ANESTHESIA  . Difficulty sleeping   . History of nonmelanoma skin cancer   . Hypertension 10/30/2016  . Inguinal hernia   . Lung mass    right / HAS HAD RADIATION AND CHEMO FOR LUNG CANCER  . Pneumothorax on right 08/24/2015  . Productive cough    "DUE TO  RADIATION"  . Radiation 05/11/15-06/21/15   NSCLCA/right lung  . Smoking 1/2 pack a day or less 09/17/2016  . Spontaneous pneumothorax MANY YRS AGO AND AGAIN 04/04/15   HISTORY OF (ONLY)  . Weight loss 04/01/2017    ALLERGIES:  is allergic to aleve [naproxen].  MEDICATIONS:  Current Outpatient Prescriptions  Medication Sig Dispense Refill  . acetaminophen (TYLENOL) 325 MG tablet 2 tablets    . Aclidinium Bromide (TUDORZA PRESSAIR) 400 MCG/ACT AEPB Inhale 1 puff into the lungs 2 (two) times daily. 30 each 11  . albuterol (PROVENTIL HFA) 108 (90 Base) MCG/ACT inhaler 1-2 puffs as needed    . Arginine (RA L-ARGININE) 1000 MG TABS Take 500 mg by mouth daily.     . Ascorbic Acid (VITAMIN C) 1000 MG tablet Take 1,000 mg by mouth daily. Pt takes 2 5056mtabs    . B Complex Vitamins (B COMPLEX PO) Take 1 capsule by mouth daily.     . budesonide-formoterol (SYMBICORT) 160-4.5 MCG/ACT inhaler 2 puffs BID 1 Inhaler 5  . CALCIUM CITRATE PO Take 1 tablet by mouth daily.    . Cyanocobalamin (VITAMIN B 12 PO) Take 1 tablet by mouth daily.    . DUREZOL 0.05 % EMUL Place 1 drop into the right eye 2 (two) times daily.    . ferrous sulfate 325 (65 FE) MG tablet Take 325 mg by mouth daily with breakfast.    . guaiFENesin (MUCINEX) 600 MG 12 hr tablet Take 1 tablet (600 mg total) by mouth 2 (two) times daily as needed.    .Marland Kitchen  ketorolac (ACULAR) 0.5 % ophthalmic solution Place 1 drop into the right eye 2 (two) times daily.    . magic mouthwash SOLN Take 5 mLs by mouth 4 (four) times daily as needed.    . magnesium 30 MG tablet Take 30 mg by mouth daily.    . methylphenidate (RITALIN) 5 MG tablet Take 1 tablet (5 mg total) by mouth 2 (two) times daily with breakfast and lunch. 60 tablet 0  . Multiple Vitamin (MULTI VITAMIN DAILY PO) Take 1 tablet by mouth daily.    . Omega-3 Fatty Acids (FISH OIL) 1000 MG CAPS Take 1,000 mg by mouth daily.    . ondansetron (ZOFRAN) 8 MG tablet Take 1 tablet (8 mg total) by mouth  every 8 (eight) hours as needed for nausea or vomiting. 20 tablet 0  . prochlorperazine (COMPAZINE) 10 MG tablet Take 1 tablet (10 mg total) by mouth every 6 (six) hours as needed for nausea or vomiting. 30 tablet 0  . saccharomyces boulardii (FLORASTOR) 250 MG capsule Take 250 mg by mouth 2 (two) times daily.    . vitamin A 10000 UNIT capsule 1 capsule with food or milk     No current facility-administered medications for this visit.     SURGICAL HISTORY:  Past Surgical History:  Procedure Laterality Date  . CHEST TUBE INSERTION Right 08/02/2015   Procedure: INSERTION OF SECOND RIGHT CHEST TUBE WITH FLUROSCOPY;  Surgeon: Grace Isaac, MD;  Location: [facility];  Service: Thoracic;  Laterality: Right;  . CHEST TUBE INSERTION  07/28/2015  . COLONOSCOPY W/ BIOPSIES AND POLYPECTOMY    . HERNIA REPAIR  2011  . INGUINAL HERNIA REPAIR Left 07/15/2015   Procedure: OPEN LEFT INGUINAL HERNIA REPAIR;  Surgeon: Johnathan Hausen, MD;  Location: [facility];  Service: General;  Laterality: Left;  With MESH  . TONSILLECTOMY    . VIDEO BRONCHOSCOPY WITH ENDOBRONCHIAL ULTRASOUND N/A 04/18/2015   Procedure: VIDEO BRONCHOSCOPY WITH ENDOBRONCHIAL ULTRASOUND;  Surgeon: Grace Isaac, MD;  Location: [facility];  Service: Thoracic;  Laterality: N/A;  . VIDEO BRONCHOSCOPY WITH INSERTION OF INTERBRONCHIAL VALVE (IBV) N/A 08/02/2015   Procedure: VIDEO BRONCHOSCOPY WITH ATTEMPTED INSERTION OF INTERBRONCHIAL VALVE (IBV) WITH FLUROSCOPY;  Surgeon: Grace Isaac, MD;  Location[facility];  Service: Thoracic;  Laterality: N/A;  . WISDOM TOOTH EXTRACTION      REVIEW OF SYSTEMS:  A comprehensive review of systems was negative except for: Respiratory: positive for cough and dyspnea on exertion   PHYSICAL EXAMINATION: General appearance: alert, cooperative and no distress Head: Normocephalic, without obvious abnormality, atraumatic Neck: no adenopathy, no JVD, supple, symmetrical, trachea midline and thyroid not enlarged,  symmetric, no tenderness/mass/nodules Lymph nodes: Cervical, supraclavicular, and axillary nodes normal. Resp: wheezes RUL Back: symmetric, no curvature. ROM normal. No CVA tenderness. Cardio: regular rate and rhythm, S1, S2 normal, no murmur, click, rub or gallop GI: soft, non-tender; bowel sounds normal; no masses,  no organomegaly Extremities: extremities normal, atraumatic, no cyanosis or edema  ECOG PERFORMANCE STATUS: 1 - Symptomatic but completely ambulatory  Blood pressure (!) 169/84, pulse 94, temperature 97.7 F (36.5 C), temperature source Oral, resp. rate 18, height 5' 10"  (1.778 m), weight 117 lb (53.1 kg), SpO2 100 %.  LABORATORY DATA: Lab Results  Component Value Date   WBC 7.9 05/13/2017   HGB 13.9 05/13/2017   HCT 42.2 05/13/2017   MCV 90.6 05/13/2017   PLT 209 05/13/2017      Chemistry      Component Value  Date/Time   NA 142 04/29/2017 1344   K 3.9 04/29/2017 1344   CL 102 09/12/2015 0410   CO2 28 04/29/2017 1344   BUN 29.0 (H) 04/29/2017 1344   CREATININE 1.0 04/29/2017 1344      Component Value Date/Time   CALCIUM 10.1 04/29/2017 1344   ALKPHOS 103 04/29/2017 1344   AST 20 04/29/2017 1344   ALT 19 04/29/2017 1344   BILITOT 0.31 04/29/2017 1344       RADIOGRAPHIC STUDIES: No results found.  ASSESSMENT AND PLAN: This is a very pleasant 76 years old white male with metastatic non-small cell lung cancer, adenocarcinoma status post short course of concurrent chemoradiation with weekly carboplatin and paclitaxel followed by 1 cycle of consolidation chemotherapy discontinued secondary to intolerance. He is currently undergoing treatment with immunotherapy with Nivolumab status post 32 cycles. He is tolerating the treatment well. I recommended for the patient to proceed with cycle #3 today as a scheduled. I would see him back for follow-up visit in 2 weeks for evaluation after repeating CT scan of the chest, abdomen and pelvis for restaging of his  disease. For hypertension, I strongly recommend for the patient to monitor his blood pressure closely at home and if it stays elevated to consult with his primary care physician. He was advised to call immediately if he has any concerning symptoms in the interval. The patient voices understanding of current disease status and treatment options and is in agreement with the current care plan. All questions were answered. The patient knows to call the clinic with any problems, questions or concerns. We can certainly see the patient much sooner if necessary.  Disclaimer: This note was dictated with voice recognition software. Similar sounding words can inadvertently be transcribed and may not be corrected upon review.

## 2017-05-13 NOTE — Patient Instructions (Signed)
Steps to Quit Smoking Smoking tobacco can be bad for your health. It can also affect almost every organ in your body. Smoking puts you and people around you at risk for many serious long-lasting (chronic) diseases. Quitting smoking is hard, but it is one of the best things that you can do for your health. It is never too late to quit. What are the benefits of quitting smoking? When you quit smoking, you lower your risk for getting serious diseases and conditions. They can include:  Lung cancer or lung disease.  Heart disease.  Stroke.  Heart attack.  Not being able to have children (infertility).  Weak bones (osteoporosis) and broken bones (fractures).  If you have coughing, wheezing, and shortness of breath, those symptoms may get better when you quit. You may also get sick less often. If you are pregnant, quitting smoking can help to lower your chances of having a baby of low birth weight. What can I do to help me quit smoking? Talk with your doctor about what can help you quit smoking. Some things you can do (strategies) include:  Quitting smoking totally, instead of slowly cutting back how much you smoke over a period of time.  Going to in-person counseling. You are more likely to quit if you go to many counseling sessions.  Using resources and support systems, such as: ? Online chats with a counselor. ? Phone quitlines. ? Printed self-help materials. ? Support groups or group counseling. ? Text messaging programs. ? Mobile phone apps or applications.  Taking medicines. Some of these medicines may have nicotine in them. If you are pregnant or breastfeeding, do not take any medicines to quit smoking unless your doctor says it is okay. Talk with your doctor about counseling or other things that can help you.  Talk with your doctor about using more than one strategy at the same time, such as taking medicines while you are also going to in-person counseling. This can help make  quitting easier. What things can I do to make it easier to quit? Quitting smoking might feel very hard at first, but there is a lot that you can do to make it easier. Take these steps:  Talk to your family and friends. Ask them to support and encourage you.  Call phone quitlines, reach out to support groups, or work with a counselor.  Ask people who smoke to not smoke around you.  Avoid places that make you want (trigger) to smoke, such as: ? Bars. ? Parties. ? Smoke-break areas at work.  Spend time with people who do not smoke.  Lower the stress in your life. Stress can make you want to smoke. Try these things to help your stress: ? Getting regular exercise. ? Deep-breathing exercises. ? Yoga. ? Meditating. ? Doing a body scan. To do this, close your eyes, focus on one area of your body at a time from head to toe, and notice which parts of your body are tense. Try to relax the muscles in those areas.  Download or buy apps on your mobile phone or tablet that can help you stick to your quit plan. There are many free apps, such as QuitGuide from the CDC (Centers for Disease Control and Prevention). You can find more support from smokefree.gov and other websites.  This information is not intended to replace advice given to you by your health care provider. Make sure you discuss any questions you have with your health care provider. Document Released: 08/11/2009 Document   Revised: 06/12/2016 Document Reviewed: 03/01/2015 Elsevier Interactive Patient Education  2018 Elsevier Inc.  

## 2017-05-22 ENCOUNTER — Encounter: Payer: Self-pay | Admitting: Internal Medicine

## 2017-05-22 ENCOUNTER — Ambulatory Visit (INDEPENDENT_AMBULATORY_CARE_PROVIDER_SITE_OTHER): Payer: 59 | Admitting: Internal Medicine

## 2017-05-22 ENCOUNTER — Other Ambulatory Visit (INDEPENDENT_AMBULATORY_CARE_PROVIDER_SITE_OTHER): Payer: 59

## 2017-05-22 VITALS — BP 148/88 | HR 87 | Ht 70.0 in | Wt 116.2 lb

## 2017-05-22 DIAGNOSIS — R5383 Other fatigue: Secondary | ICD-10-CM | POA: Diagnosis not present

## 2017-05-22 DIAGNOSIS — Z8639 Personal history of other endocrine, nutritional and metabolic disease: Secondary | ICD-10-CM | POA: Diagnosis not present

## 2017-05-22 DIAGNOSIS — J449 Chronic obstructive pulmonary disease, unspecified: Secondary | ICD-10-CM | POA: Diagnosis not present

## 2017-05-22 LAB — VITAMIN D 25 HYDROXY (VIT D DEFICIENCY, FRACTURES): VITD: 54.13 ng/mL (ref 30.00–100.00)

## 2017-05-22 NOTE — Addendum Note (Signed)
Addended by: Collier Salina on: 05/22/2017 09:53 AM   Modules accepted: Orders

## 2017-05-22 NOTE — Patient Instructions (Addendum)
ICD-10-CM   1. Stage 2 moderate COPD by GOLD classification ([place]) J44.9   2. Other fatigue R53.83   3. H/O vitamin D deficiency Z86.39    Stable disease With improved symptoms after addition of tudorza to symbicort Fatigue is likely multifactorial but he will need to ensure there is no vitamin D deficiency  Plan - Continue Symbicort and tudorza scheduled as before - Albuterol as needed  - Check vitamin D level - Flu shot in the fall - Please talk to PCP Aura Dials, MD  And Dr Julien Nordmann-  and discuss safety and utility of new  shingrix vaccine   Follow-up - 6 months or sooner if needed

## 2017-05-22 NOTE — Progress Notes (Signed)
Subjective:     Patient ID: Gabriel [name], male   DOB: 08-11-1941, 76 y.o.   MRN: [id number]  HPI   OV 02/13/2017  Chief Complaint  Patient presents with  . Follow-up    Pt saw SG in Feb 2018 for an acute visit. Pt states he is improved and states his breathing is back to baseline. Pt c/o prod cough with white mucus - pt states this is baseline. Pt denies CP/tightness and f/c/s.    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  He is on immune therapy for his advanced lung cancer. He had CT scan of the chest 02/01/2017 the visualized overall he will fibrosis is significantly improved in the last 2 years. There is some nodules which she believes do not represent cancer and is on continued immunotherapy. At this point in time he feels stable but with good functional status. He is fatigued though he did try pulmonary rehabilitation it did not help. He also has some amount of cough with mild white mucus. Prednisone a few months ago helped him a lot he took that for 4 weeks. He is on Symbicort. He does feel he can be better with the symptoms. He is not interested in chronic daily prednisone      OV 05/22/2017  Chief Complaint  Patient presents with  . Follow-up    Pt states since starting the tudorza his breathing has improved. Pt c/o prod cough with white mucus. Pt denies CP/tightness and f/c/s.     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  He is on immune therapy for his advanced lung cancer. This visit is to follow-up addition of anticholinergic agent Tudorza to his Symbicort to see if it improves symptoms. He says he's had a modest improvement and shortness of breath because of this addition. Overall good functional status. He continues to work. He continues to complain of significant  fatigue at the end of the day when he returns from work. Previously pulmonary rehabilitation would help. He says that he's had previous thyroid function test checked and it was normal. He takes vitamin D supplement but does not know if he's had a level checked Walking desaturation test 185 feet 3 laps on room air the forehead probe:     has a past medical history of Cancer ([place]); Constipation; Difficulty sleeping; History of nonmelanoma skin cancer; Hypertension (10/30/2016); Inguinal hernia; Lung mass; Pneumothorax on right (08/24/2015); Productive cough; Radiation (05/11/15-06/21/15); Smoking 1/2 pack a day or less (09/17/2016); Spontaneous pneumothorax (MANY YRS AGO AND AGAIN 04/04/15); and Weight loss (04/01/2017).   reports that he has been smoking Cigarettes.  He has a 13.75 pack-year smoking history. He has never used smokeless tobacco.  Past Surgical History:  Procedure Laterality Date  . CHEST TUBE INSERTION Right 08/02/2015   Procedure: INSERTION OF SECOND RIGHT CHEST TUBE WITH FLUROSCOPY;  Surgeon: Grace Isaac, MD;  Location: [facility];  Service: Thoracic;  Laterality: Right;  . CHEST TUBE INSERTION  07/28/2015  . COLONOSCOPY W/ BIOPSIES AND POLYPECTOMY    . HERNIA REPAIR  2011  . INGUINAL HERNIA REPAIR Left 07/15/2015   Procedure: OPEN LEFT INGUINAL HERNIA REPAIR;  Surgeon: Johnathan Hausen, MD;  Location: [facility];  Service: General;  Laterality: Left;  With MESH  . TONSILLECTOMY    . VIDEO BRONCHOSCOPY WITH ENDOBRONCHIAL ULTRASOUND  N/A 04/18/2015   Procedure: VIDEO BRONCHOSCOPY WITH ENDOBRONCHIAL ULTRASOUND;  Surgeon: Grace Isaac, MD;  Location: [facility];  Service: Thoracic;  Laterality: N/A;  . VIDEO BRONCHOSCOPY WITH INSERTION OF INTERBRONCHIAL VALVE (IBV) N/A 08/02/2015   Procedure: VIDEO BRONCHOSCOPY WITH ATTEMPTED INSERTION OF INTERBRONCHIAL VALVE (IBV) WITH FLUROSCOPY;  Surgeon: Grace Isaac, MD;  Location: [facility];  Service: Thoracic;  Laterality: N/A;  . WISDOM TOOTH EXTRACTION       Allergies  Allergen Reactions  . Aleve [Naproxen] Rash    Immunization History  Administered Date(s) Administered  . Influenza,inj,Quad PF,36+ Mos 09/17/2016  . Pneumococcal Conjugate-13 02/14/2016    Family History  Problem Relation Age of Onset  . Heart attack Father   . Heart disease Father   . Hypertension Mother      Current Outpatient Prescriptions:  .  acetaminophen (TYLENOL) 325 MG tablet, 2 tablets, Disp: , Rfl:  .  Aclidinium Bromide (TUDORZA PRESSAIR) 400 MCG/ACT AEPB, Inhale 1 puff into the lungs 2 (two) times daily., Disp: 30 each, Rfl: 11 .  albuterol (PROVENTIL HFA) 108 (90 Base) MCG/ACT inhaler, 1-2 puffs as needed, Disp: , Rfl:  .  Arginine (RA L-ARGININE) 1000 MG TABS, Take 500 mg by mouth daily. , Disp: , Rfl:  .  Ascorbic Acid (VITAMIN C) 1000 MG tablet, Take 1,000 mg by mouth daily. Pt takes 2 500mg  tabs, Disp: , Rfl:  .  B Complex Vitamins (B COMPLEX PO), Take 1 capsule by mouth daily. , Disp: , Rfl:  .  budesonide-formoterol (SYMBICORT) 160-4.5 MCG/ACT inhaler, 2 puffs BID, Disp: 1 Inhaler, Rfl: 5 .  CALCIUM CITRATE PO, Take 1 tablet by mouth daily., Disp: , Rfl:  .  Cyanocobalamin (VITAMIN B 12 PO), Take 1 tablet by mouth daily., Disp: , Rfl:  .  ferrous sulfate 325 (65 FE) MG tablet, Take 325 mg by mouth daily with breakfast., Disp: , Rfl:  .  guaiFENesin (MUCINEX) 600 MG 12 hr tablet, Take 1 tablet (600 mg total) by mouth 2 (two) times daily as needed., Disp: , Rfl:  .  magic mouthwash SOLN, Take 5 mLs by mouth 4 (four) times daily as needed., Disp: , Rfl:  .  magnesium 30 MG tablet, Take 30 mg by mouth daily., Disp: , Rfl:  .  methylphenidate (RITALIN) 5 MG tablet, Take 1 tablet (5 mg total) by mouth 2 (two) times daily with breakfast and lunch., Disp: 60 tablet, Rfl: 0 .  Multiple Vitamin (MULTI VITAMIN DAILY PO), Take 1 tablet by mouth daily., Disp: , Rfl:  .  Omega-3 Fatty Acids (FISH OIL) 1000 MG CAPS, Take 1,000 mg by mouth daily., Disp: , Rfl:   .  ondansetron (ZOFRAN) 8 MG tablet, Take 1 tablet (8 mg total) by mouth every 8 (eight) hours as needed for nausea or vomiting., Disp: 20 tablet, Rfl: 0 .  prochlorperazine (COMPAZINE) 10 MG tablet, Take 1 tablet (10 mg total) by mouth every 6 (six) hours as needed for nausea or vomiting., Disp: 30 tablet, Rfl: 0 .  saccharomyces boulardii (FLORASTOR) 250 MG capsule, Take 250 mg by mouth 2 (two) times daily., Disp: , Rfl:  .  vitamin A 10000 UNIT capsule, 1 capsule with food or milk, Disp: , Rfl:    Review of Systems     Objective:   Physical Exam  Constitutional: He is oriented to person, place, and time. He appears well-developed and well-nourished. No distress.  HENT:  Head: Normocephalic and atraumatic.  Right Ear: External ear  normal.  Left Ear: External ear normal.  Mouth/Throat: Oropharynx is clear and moist. No oropharyngeal exudate.  Eyes: Pupils are equal, round, and reactive to light. Conjunctivae and EOM are normal. Right eye exhibits no discharge. Left eye exhibits no discharge. No scleral icterus.  Neck: Normal range of motion. Neck supple. No JVD present. No tracheal deviation present. No thyromegaly present.  Cardiovascular: Normal rate, regular rhythm and intact distal pulses.  Exam reveals no gallop and no friction rub.   No murmur heard. Pulmonary/Chest: Effort normal and breath sounds normal. No respiratory distress. He has no wheezes. He has no rales. He exhibits no tenderness.  Unilateral volume loss of chest cage  Abdominal: Soft. Bowel sounds are normal. He exhibits no distension and no mass. There is no tenderness. There is no rebound and no guarding.  Musculoskeletal: Normal range of motion. He exhibits no edema or tenderness.  Lymphadenopathy:    He has no cervical adenopathy.  Neurological: He is alert and oriented to person, place, and time. He has normal reflexes. No cranial nerve deficit. Coordination normal.  Skin: Skin is warm and dry. No rash noted. He  is not diaphoretic. No erythema. No pallor.  Psychiatric: He has a normal mood and affect. His behavior is normal. Judgment and thought content normal.  Nursing note and vitals reviewed.  Vitals:   05/22/17 0929  BP: (!) 148/88  Pulse: 87  SpO2: 96%  Weight: 116 lb 3.2 oz (52.7 kg)  Height: 5\' 10"  (1.778 m)    Estimated body mass index is 16.67 kg/m as calculated from the following:   Height as of this encounter: 5\' 10"  (1.778 m).   Weight as of this encounter: 116 lb 3.2 oz (52.7 kg).     Assessment:       ICD-10-CM   1. Stage 2 moderate COPD by GOLD classification ([place]) J44.9   2. Other fatigue R53.83   3. H/O vitamin D deficiency Z86.39        Plan:      Stable disease With improved symptoms after addition of tudorza to symbicort Fatigue is likely multifactorial but he will need to ensure there is no vitamin D deficiency  Plan - Continue Symbicort and tudorza scheduled as before - Albuterol as needed  - Check vitamin D level - Flu shot in the fall - Please talk to PCP Aura Dials, MD  And Dr Julien Nordmann-  and discuss safety and utility of new  shingrix vaccine   Follow-up - 6 months or sooner if needed     Dr. Brand Males, M.D., [facility].C.P Pulmonary and Critical Care Medicine Staff Physician [facility] Pager: [phone], If no answer or between  15:00h - 7:00h: call [phone]  05/22/2017 9:52 AM

## 2017-05-23 ENCOUNTER — Ambulatory Visit (HOSPITAL_COMMUNITY)
Admission: RE | Admit: 2017-05-23 | Discharge: 2017-05-23 | Disposition: A | Discharge status: Home / Self Care | Payer: 59 | Source: Ambulatory Visit | Attending: Internal Medicine | Admitting: Internal Medicine

## 2017-05-23 DIAGNOSIS — C3491 Malignant neoplasm of unspecified part of right bronchus or lung: Secondary | ICD-10-CM | POA: Insufficient documentation

## 2017-05-23 DIAGNOSIS — C7951 Secondary malignant neoplasm of bone: Secondary | ICD-10-CM | POA: Insufficient documentation

## 2017-05-23 DIAGNOSIS — Z5112 Encounter for antineoplastic immunotherapy: Secondary | ICD-10-CM | POA: Insufficient documentation

## 2017-05-23 MED ORDER — IOPAMIDOL (ISOVUE-300) INJECTION 61%
100.0000 mL | Freq: Once | INTRAVENOUS | Status: AC | PRN
  Administered 2017-05-23: 100 mL via INTRAVENOUS

## 2017-05-23 MED ORDER — IOPAMIDOL (ISOVUE-300) INJECTION 61%
INTRAVENOUS | Status: AC
  Filled 2017-05-23: qty 100

## 2017-05-24 NOTE — Progress Notes (Signed)
lmtcb for pt.  

## 2017-05-27 ENCOUNTER — Encounter: Payer: Self-pay | Admitting: Internal Medicine

## 2017-05-27 ENCOUNTER — Ambulatory Visit (HOSPITAL_BASED_OUTPATIENT_CLINIC_OR_DEPARTMENT_OTHER): Payer: 59 | Admitting: Internal Medicine

## 2017-05-27 ENCOUNTER — Ambulatory Visit (HOSPITAL_BASED_OUTPATIENT_CLINIC_OR_DEPARTMENT_OTHER): Payer: 59

## 2017-05-27 ENCOUNTER — Telehealth: Payer: Self-pay | Admitting: Internal Medicine

## 2017-05-27 ENCOUNTER — Other Ambulatory Visit (HOSPITAL_BASED_OUTPATIENT_CLINIC_OR_DEPARTMENT_OTHER): Payer: 59

## 2017-05-27 VITALS — BP 139/84 | HR 86

## 2017-05-27 VITALS — BP 174/91 | HR 94 | Temp 97.6°F | Resp 18 | Ht 70.0 in | Wt 117.1 lb

## 2017-05-27 DIAGNOSIS — Z5112 Encounter for antineoplastic immunotherapy: Secondary | ICD-10-CM

## 2017-05-27 DIAGNOSIS — J449 Chronic obstructive pulmonary disease, unspecified: Secondary | ICD-10-CM

## 2017-05-27 DIAGNOSIS — C3491 Malignant neoplasm of unspecified part of right bronchus or lung: Secondary | ICD-10-CM | POA: Diagnosis not present

## 2017-05-27 DIAGNOSIS — C3411 Malignant neoplasm of upper lobe, right bronchus or lung: Secondary | ICD-10-CM

## 2017-05-27 DIAGNOSIS — M899 Disorder of bone, unspecified: Secondary | ICD-10-CM

## 2017-05-27 DIAGNOSIS — I10 Essential (primary) hypertension: Secondary | ICD-10-CM | POA: Diagnosis not present

## 2017-05-27 LAB — CBC WITH DIFFERENTIAL/PLATELET
BASO%: 0.5 % (ref 0.0–2.0)
Basophils Absolute: 0 10*3/uL (ref 0.0–0.1)
EOS ABS: 0.2 10*3/uL (ref 0.0–0.5)
EOS%: 2.2 % (ref 0.0–7.0)
HEMATOCRIT: 42.2 % (ref 38.4–49.9)
HEMOGLOBIN: 13.9 g/dL (ref 13.0–17.1)
LYMPH#: 1 10*3/uL (ref 0.9–3.3)
LYMPH%: 12.1 % — AB (ref 14.0–49.0)
MCH: 29.8 pg (ref 27.2–33.4)
MCHC: 32.9 g/dL (ref 32.0–36.0)
MCV: 90.6 fL (ref 79.3–98.0)
MONO#: 0.6 10*3/uL (ref 0.1–0.9)
MONO%: 7.8 % (ref 0.0–14.0)
NEUT%: 77.4 % — ABNORMAL HIGH (ref 39.0–75.0)
NEUTROS ABS: 6.4 10*3/uL (ref 1.5–6.5)
PLATELETS: 217 10*3/uL (ref 140–400)
RBC: 4.66 10*6/uL (ref 4.20–5.82)
RDW: 14.3 % (ref 11.0–14.6)
WBC: 8.2 10*3/uL (ref 4.0–10.3)

## 2017-05-27 LAB — COMPREHENSIVE METABOLIC PANEL
ALBUMIN: 4 g/dL (ref 3.5–5.0)
ALK PHOS: 109 U/L (ref 40–150)
ALT: 27 U/L (ref 0–55)
ANION GAP: 9 meq/L (ref 3–11)
AST: 22 U/L (ref 5–34)
BILIRUBIN TOTAL: 0.25 mg/dL (ref 0.20–1.20)
BUN: 28.9 mg/dL — ABNORMAL HIGH (ref 7.0–26.0)
CO2: 26 meq/L (ref 22–29)
CREATININE: 0.9 mg/dL (ref 0.7–1.3)
Calcium: 10.2 mg/dL (ref 8.4–10.4)
Chloride: 105 mEq/L (ref 98–109)
EGFR: 78 mL/min/{1.73_m2} — ABNORMAL LOW (ref >90)
Glucose: 101 mg/dl (ref 70–140)
Potassium: 3.9 mEq/L (ref 3.5–5.1)
Sodium: 140 mEq/L (ref 136–145)
Total Protein: 7.9 g/dL (ref 6.4–8.3)

## 2017-05-27 MED ORDER — NIVOLUMAB CHEMO INJECTION 100 MG/10ML
240.0000 mg | Freq: Once | INTRAVENOUS | Status: AC
  Administered 2017-05-27: 240 mg via INTRAVENOUS
  Filled 2017-05-27: qty 24

## 2017-05-27 MED ORDER — SODIUM CHLORIDE 0.9 % IV SOLN
Freq: Once | INTRAVENOUS | Status: AC
  Administered 2017-05-27: 15:00:00 via INTRAVENOUS

## 2017-05-27 NOTE — Telephone Encounter (Signed)
Gave patient avs report and appointments for August and September  °

## 2017-05-27 NOTE — Patient Instructions (Signed)
[facility] Discharge Instructions for Patients Receiving Chemotherapy  Today you received the following chemotherapy agents:  Nivolumab.  To help prevent nausea and vomiting after your treatment, we encourage you to take your nausea medication as directed.   If you develop nausea and vomiting that is not controlled by your nausea medication, call the clinic.   BELOW ARE SYMPTOMS THAT SHOULD BE REPORTED IMMEDIATELY:  *FEVER GREATER THAN 100.5 F  *CHILLS WITH OR WITHOUT FEVER  NAUSEA AND VOMITING THAT IS NOT CONTROLLED WITH YOUR NAUSEA MEDICATION  *UNUSUAL SHORTNESS OF BREATH  *UNUSUAL BRUISING OR BLEEDING  TENDERNESS IN MOUTH AND THROAT WITH OR WITHOUT PRESENCE OF ULCERS  *URINARY PROBLEMS  *BOWEL PROBLEMS  UNUSUAL RASH Items with * indicate a potential emergency and should be followed up as soon as possible.  Feel free to call the clinic you have any questions or concerns. The clinic phone number is [phone].  Please show the CHEMO ALERT CARD at check-in to the Emergency Department and triage nurse.   

## 2017-05-27 NOTE — Progress Notes (Signed)
[facility] [phone]   [phone]) [phone]  OFFICE PROGRESS NOTE  Aura Dials, MD [phone] [address] [place] [address]  DIAGNOSIS: Metastatic non-small cell lung cancer, adenocarcinoma initially diagnosed as unresectable stage IIIa (T2a, N2, M0) in July 2016.  [facility] molecular studies reveal that he was positive for [place], [place], and STAG2 [id number]. He was negative for: EGFR, ALK, BRAF, MET, RET, ERBB2 and ROS1  PRIOR THERAPY: 1) Concurrent chemoradiation with chemotherapy in the form of weekly carboplatin for AUC 2 paclitaxel 45 mg/m given concurrent with radiation therapy. First cycle started 05/09/2015. Status post 7 cycles. 2) Consolidation chemotherapy with carboplatin for AUC of 5 on day 1 and gemcitabine 1000 MG/M2 on days 1 and 8 every 3 weeks. First dose 08/15/2015. Discontinued secondary to intolerance.  CURRENT THERAPY: Immunotherapy with Nivolumab 240 mg IV every 2 weeks, status post 33 cycles.  INTERVAL HISTORY: Gabriel WINTERHALTER76y.o. male returns to the clinic today for follow-up visit accompanied by his son and daughter. The patient is feeling fine today with no specific complaints except for anxiety about his scan results. His blood pressure is elevated today but usually much lower at home. He denied having any chest pain, but has shortness of breath with exertion and mild cough with no hemoptysis. He denied having any fever or chills. He has no nausea, vomiting, diarrhea or constipation. He has been tolerating his treatment with Nivolumab fairly well. He is here today for evaluation after repeating CT scan of the chest, abdomen and pelvis for restaging of his disease.   MEDICAL HISTORY: Past Medical History:  Diagnosis Date  . Cancer ([place]    skin cancer  . Constipation    AFTER ANESTHESIA  . Difficulty sleeping   . History of nonmelanoma skin cancer   . Hypertension 10/30/2016  . Inguinal  hernia   . Lung mass    right / HAS HAD RADIATION AND CHEMO FOR LUNG CANCER  . Pneumothorax on right 08/24/2015  . Productive cough    "DUE TO RADIATION"  . Radiation 05/11/15-06/21/15   NSCLCA/right lung  . Smoking 1/2 pack a day or less 09/17/2016  . Spontaneous pneumothorax MANY YRS AGO AND AGAIN 04/04/15   HISTORY OF (ONLY)  . Weight loss 04/01/2017    ALLERGIES:  is allergic to aleve [naproxen].  MEDICATIONS:  Current Outpatient Prescriptions  Medication Sig Dispense Refill  . acetaminophen (TYLENOL) 325 MG tablet 2 tablets    . Aclidinium Bromide (TUDORZA PRESSAIR) 400 MCG/ACT AEPB Inhale 1 puff into the lungs 2 (two) times daily. 30 each 11  . albuterol (PROVENTIL HFA) 108 (90 Base) MCG/ACT inhaler 1-2 puffs as needed    . Arginine (RA L-ARGININE) 1000 MG TABS Take 500 mg by mouth daily.     . Ascorbic Acid (VITAMIN C) 1000 MG tablet Take 1,000 mg by mouth daily. Pt takes 2 '500mg'$  tabs    . B Complex Vitamins (B COMPLEX PO) Take 1 capsule by mouth daily.     . budesonide-formoterol (SYMBICORT) 160-4.5 MCG/ACT inhaler 2 puffs BID 1 Inhaler 5  . CALCIUM CITRATE PO Take 1 tablet by mouth daily.    . Cyanocobalamin (VITAMIN B 12 PO) Take 1 tablet by mouth daily.    . ferrous sulfate 325 (65 FE) MG tablet Take 325 mg by mouth daily with breakfast.    . guaiFENesin (MUCINEX) 600 MG 12 hr tablet Take 1 tablet (600 mg total)  by mouth 2 (two) times daily as needed.    . magic mouthwash SOLN Take 5 mLs by mouth 4 (four) times daily as needed.    . magnesium 30 MG tablet Take 30 mg by mouth daily.    . Multiple Vitamin (MULTI VITAMIN DAILY PO) Take 1 tablet by mouth daily.    . Omega-3 Fatty Acids (FISH OIL) 1000 MG CAPS Take 1,000 mg by mouth daily.    Marland Kitchen saccharomyces boulardii (FLORASTOR) 250 MG capsule Take 250 mg by mouth 2 (two) times daily.    . vitamin A 20313 UNIT capsule 1 capsule with food or milk    . methylphenidate (RITALIN) 5 MG tablet Take 1 tablet (5 mg total) by mouth 2  (two) times daily with breakfast and lunch. (Patient not taking: Reported on 05/27/2017) 60 tablet 0  . ondansetron (ZOFRAN) 8 MG tablet Take 1 tablet (8 mg total) by mouth every 8 (eight) hours as needed for nausea or vomiting. (Patient not taking: Reported on 05/27/2017) 20 tablet 0  . prochlorperazine (COMPAZINE) 10 MG tablet Take 1 tablet (10 mg total) by mouth every 6 (six) hours as needed for nausea or vomiting. (Patient not taking: Reported on 05/27/2017) 30 tablet 0   No current facility-administered medications for this visit.     SURGICAL HISTORY:  Past Surgical History:  Procedure Laterality Date  . CHEST TUBE INSERTION Right 08/02/2015   Procedure: INSERTION OF SECOND RIGHT CHEST TUBE WITH FLUROSCOPY;  Surgeon: Delight Ovens, MD;  Location: [facility] OR;  Service: Thoracic;  Laterality: Right;  . CHEST TUBE INSERTION  07/28/2015  . COLONOSCOPY W/ BIOPSIES AND POLYPECTOMY    . HERNIA REPAIR  2011  . INGUINAL HERNIA REPAIR Left 07/15/2015   Procedure: OPEN LEFT INGUINAL HERNIA REPAIR;  Surgeon: Luretha Murphy, MD;  Location: [facility] ORS;  Service: General;  Laterality: Left;  With MESH  . TONSILLECTOMY    . VIDEO BRONCHOSCOPY WITH ENDOBRONCHIAL ULTRASOUND N/A 04/18/2015   Procedure: VIDEO BRONCHOSCOPY WITH ENDOBRONCHIAL ULTRASOUND;  Surgeon: Delight Ovens, MD;  Location: [facility] OR;  Service: Thoracic;  Laterality: N/A;  . VIDEO BRONCHOSCOPY WITH INSERTION OF INTERBRONCHIAL VALVE (IBV) N/A 08/02/2015   Procedure: VIDEO BRONCHOSCOPY WITH ATTEMPTED INSERTION OF INTERBRONCHIAL VALVE (IBV) WITH FLUROSCOPY;  Surgeon: Delight Ovens, MD;  Location: [facility] OR;  Service: Thoracic;  Laterality: N/A;  . WISDOM TOOTH EXTRACTION      REVIEW OF SYSTEMS:  Constitutional: positive for fatigue Eyes: negative Ears, nose, mouth, throat, and face: negative Respiratory: positive for cough and dyspnea on exertion Cardiovascular: negative Gastrointestinal: negative Genitourinary:negative Integument/breast:  negative Hematologic/lymphatic: negative Musculoskeletal:negative Neurological: negative Behavioral/Psych: negative Endocrine: negative Allergic/Immunologic: negative   PHYSICAL EXAMINATION: General appearance: alert, cooperative and no distress Head: Normocephalic, without obvious abnormality, atraumatic Neck: no adenopathy, no JVD, supple, symmetrical, trachea midline and thyroid not enlarged, symmetric, no tenderness/mass/nodules Lymph nodes: Cervical, supraclavicular, and axillary nodes normal. Resp: wheezes RUL Back: symmetric, no curvature. ROM normal. No CVA tenderness. Cardio: regular rate and rhythm, S1, S2 normal, no murmur, click, rub or gallop GI: soft, non-tender; bowel sounds normal; no masses,  no organomegaly Extremities: extremities normal, atraumatic, no cyanosis or edema Neurologic: Alert and oriented X 3, normal strength and tone. Normal symmetric reflexes. Normal coordination and gait  ECOG PERFORMANCE STATUS: 1 - Symptomatic but completely ambulatory  Blood pressure (!) 174/91, pulse 94, temperature 97.6 F (36.4 C), temperature source Oral, resp. rate 18, height 5\' 10"  (1.778 m), weight 117 lb 1.6 oz (53.1 kg),  SpO2 100 %.  LABORATORY DATA: Lab Results  Component Value Date   WBC 8.2 05/27/2017   HGB 13.9 05/27/2017   HCT 42.2 05/27/2017   MCV 90.6 05/27/2017   PLT 217 05/27/2017      Chemistry      Component Value Date/Time   NA 142 05/13/2017 1329   K 4.0 05/13/2017 1329   CL 102 09/12/2015 0410   CO2 26 05/13/2017 1329   BUN 27.2 (H) 05/13/2017 1329   CREATININE 1.1 05/13/2017 1329      Component Value Date/Time   CALCIUM 10.1 05/13/2017 1329   ALKPHOS 108 05/13/2017 1329   AST 20 05/13/2017 1329   ALT 21 05/13/2017 1329   BILITOT 0.30 05/13/2017 1329       RADIOGRAPHIC STUDIES: Ct Chest W Contrast  Result Date: 05/24/2017 CLINICAL DATA:  Stage III non-small cell right lung cancer diagnosed in June 2016. Chemotherapy and radiation  therapy completed. Immunotherapy ongoing. EXAM: CT CHEST, ABDOMEN, AND PELVIS WITH CONTRAST TECHNIQUE: Multidetector CT imaging of the chest, abdomen and pelvis was performed following the standard protocol during bolus administration of intravenous contrast. CONTRAST:  ISOVUE-300 IOPAMIDOL (ISOVUE-300) INJECTION 61% COMPARISON:  Prior CT 03/29/2017 and 08/31/2016. FINDINGS: CT CHEST FINDINGS Cardiovascular: There is atherosclerosis of the aorta, great vessels and coronary arteries. No acute vascular findings are demonstrated. There is poor opacification of the right upper lobe pulmonary vessels, similar to previous study. Chronically dilated veins are present in the right axilla. The heart size is normal. There is no pericardial effusion. Mediastinum/Nodes: Several right paratracheal lymph nodes are unchanged, measuring up to 10 mm on image 19. There is also stable soft tissue thickening around the right hilum, including a 2.8 x 1.8 cm component along the posterior wall of the bronchus intermedius (image 27). No progressive adenopathy demonstrated. There is stable tracheal deviation to the right. The esophagus appears unremarkable. Lungs/Pleura: There is chronic volume loss in the right hemithorax with central bronchiectasis and upper lobe necrosis. There is also chronic volume loss in the right lower lobe where there are multiple slowly enlarging pulmonary nodules, highly worrisome for metastatic disease. Representative nodules in the right lower lobe included 19 mm nodule on image 62, a 9 mm nodule on image 92 and an 8 mm nodule on image 98. 7 mm solid nodule in the superior segment of the left lower lobe (image 52) also demonstrates slow growth. Multiple ground-glass and sub solid nodules in both lungs are grossly stable. These include a 14 x 11 mm left upper lobe lesion on image number 19 and a 23 x 16 mm left lower lobe lesion on image 125. Pleural thickening at the right apex appears stable. There is  irregular right paraspinal soft tissue thickening at T11-12 with some neural foraminal extension (measuring up to 3.7 x 1.3 cm on axial image 43). This has not significantly changed from recent studies and may in part be secondary to prior radiation or treated tumor. No significant intraspinal extension of tumor. Musculoskeletal/Chest wall: Chronic T8 compression deformity. No lytic lesion or bone destruction identified. As above, there is possible partially treated tumor on the right at T11-12. CT ABDOMEN AND PELVIS FINDINGS Hepatobiliary: The liver is normal in density without focal abnormality. No evidence of gallstones, gallbladder wall thickening or biliary dilatation. Pancreas: Atrophied without focal abnormality, ductal dilatation or surrounding inflammation. Spleen: Normal in size without focal abnormality. Adrenals/Urinary Tract: Stable small low-density right adrenal adenoma. The left adrenal gland appears normal. The kidneys appear  normal without evidence of urinary tract calculus, suspicious lesion or hydronephrosis. No bladder abnormalities are seen. Stomach/Bowel: No evidence of bowel wall thickening, distention or surrounding inflammatory change. Vascular/Lymphatic: There are no enlarged abdominal or pelvic lymph nodes. Diffuse aortic and branch vessel atherosclerosis. Reproductive: Stable mild enlargement and heterogeneity of the prostate gland. Other: No ascites or peritoneal nodularity. Musculoskeletal: Slight progression of sclerotic lesion posteriorly in the right iliac bone, measuring 18 mm on image 84 (previously 16 mm). There is also probable 17 mm left sacral sclerotic metastasis (image number 88). No lytic lesion or pathologic fracture. IMPRESSION: 1. Progressive pulmonary nodularity bilaterally, greatest in the right lower lobe, highly worrisome for progressive metastatic disease. 2. Right paraspinal soft tissue extending into the neuroforamen at T11-12, suspicious for tumor. This may be  partially treated. 3. Progressive sclerotic metastases in the bony pelvis. No lytic lesion or pathologic fracture. 4. Stable treatment changes in the right upper lobe. 5. Additional ground-glass and sub solid nodules in both lungs are grossly stable. 6. No evidence extra osseous metastatic disease in the abdomen or pelvis. 7. Follow-up PET-CT may be helpful for further staging. Electronically Signed   By: Richardean Sale M.D.   On: 05/24/2017 09:46   Ct Abdomen Pelvis W Contrast  Result Date: 05/24/2017 CLINICAL DATA:  Stage III non-small cell right lung cancer diagnosed in June 2016. Chemotherapy and radiation therapy completed. Immunotherapy ongoing. EXAM: CT CHEST, ABDOMEN, AND PELVIS WITH CONTRAST TECHNIQUE: Multidetector CT imaging of the chest, abdomen and pelvis was performed following the standard protocol during bolus administration of intravenous contrast. CONTRAST:  120m ISOVUE-300 IOPAMIDOL (ISOVUE-300) INJECTION 61% COMPARISON:  Prior CT 03/29/2017 and 08/31/2016. FINDINGS: CT CHEST FINDINGS Cardiovascular: There is atherosclerosis of the aorta, great vessels and coronary arteries. No acute vascular findings are demonstrated. There is poor opacification of the right upper lobe pulmonary vessels, similar to previous study. Chronically dilated veins are present in the right axilla. The heart size is normal. There is no pericardial effusion. Mediastinum/Nodes: Several right paratracheal lymph nodes are unchanged, measuring up to 10 mm on image 19. There is also stable soft tissue thickening around the right hilum, including a 2.8 x 1.8 cm component along the posterior wall of the bronchus intermedius (image 27). No progressive adenopathy demonstrated. There is stable tracheal deviation to the right. The esophagus appears unremarkable. Lungs/Pleura: There is chronic volume loss in the right hemithorax with central bronchiectasis and upper lobe necrosis. There is also chronic volume loss in the right  lower lobe where there are multiple slowly enlarging pulmonary nodules, highly worrisome for metastatic disease. Representative nodules in the right lower lobe included 19 mm nodule on image 62, a 9 mm nodule on image 92 and an 8 mm nodule on image 98. 7 mm solid nodule in the superior segment of the left lower lobe (image 52) also demonstrates slow growth. Multiple ground-glass and sub solid nodules in both lungs are grossly stable. These include a 14 x 11 mm left upper lobe lesion on image number 19 and a 23 x 16 mm left lower lobe lesion on image 125. Pleural thickening at the right apex appears stable. There is irregular right paraspinal soft tissue thickening at T11-12 with some neural foraminal extension (measuring up to 3.7 x 1.3 cm on axial image 43). This has not significantly changed from recent studies and may in part be secondary to prior radiation or treated tumor. No significant intraspinal extension of tumor. Musculoskeletal/Chest wall: Chronic T8 compression deformity. No lytic  lesion or bone destruction identified. As above, there is possible partially treated tumor on the right at T11-12. CT ABDOMEN AND PELVIS FINDINGS Hepatobiliary: The liver is normal in density without focal abnormality. No evidence of gallstones, gallbladder wall thickening or biliary dilatation. Pancreas: Atrophied without focal abnormality, ductal dilatation or surrounding inflammation. Spleen: Normal in size without focal abnormality. Adrenals/Urinary Tract: Stable small low-density right adrenal adenoma. The left adrenal gland appears normal. The kidneys appear normal without evidence of urinary tract calculus, suspicious lesion or hydronephrosis. No bladder abnormalities are seen. Stomach/Bowel: No evidence of bowel wall thickening, distention or surrounding inflammatory change. Vascular/Lymphatic: There are no enlarged abdominal or pelvic lymph nodes. Diffuse aortic and branch vessel atherosclerosis. Reproductive: Stable  mild enlargement and heterogeneity of the prostate gland. Other: No ascites or peritoneal nodularity. Musculoskeletal: Slight progression of sclerotic lesion posteriorly in the right iliac bone, measuring 18 mm on image 84 (previously 16 mm). There is also probable 17 mm left sacral sclerotic metastasis (image number 88). No lytic lesion or pathologic fracture. IMPRESSION: 1. Progressive pulmonary nodularity bilaterally, greatest in the right lower lobe, highly worrisome for progressive metastatic disease. 2. Right paraspinal soft tissue extending into the neuroforamen at T11-12, suspicious for tumor. This may be partially treated. 3. Progressive sclerotic metastases in the bony pelvis. No lytic lesion or pathologic fracture. 4. Stable treatment changes in the right upper lobe. 5. Additional ground-glass and sub solid nodules in both lungs are grossly stable. 6. No evidence extra osseous metastatic disease in the abdomen or pelvis. 7. Follow-up PET-CT may be helpful for further staging. Electronically Signed   By: Richardean Sale M.D.   On: 05/24/2017 09:46    ASSESSMENT AND PLAN: This is a very pleasant 76 years old white male with metastatic non-small cell lung cancer, adenocarcinoma status post short course of concurrent chemoradiation with weekly carboplatin and paclitaxel followed by 1 cycle of consolidation chemotherapy discontinued secondary to intolerance. He is currently undergoing treatment with immunotherapy with Nivolumab status post 33 cycles. He is tolerating the treatment well. He had repeat CT scan of the chest, abdomen and pelvis performed recently. I personally and independently reviewed the scan images and discuss the results and showed the images to the patient and his family. The scan showed mild increase in some of the pulmonary nodules as well as the sclerotic lesion in the pelvis. I gave the patient the option of continuing his treatment with Nivolumab as a difference in size of the  pulmonary nodules and bone lesion is only in the range of a few millimeters, which in general is still considered stable disease. I also gave him the option of switching his treatment to systemic chemotherapy. After lengthy discussions the patient and his family would like to continue his current treatment with Nivolumab and will continue to monitor him closely on the upcoming scans. For hypertension, he'll continue to monitor his blood pressure closely at home. The patient will come back for follow-up visit in 2 weeks for reevaluation before starting cycle #35. He was advised to call immediately if he has any concerning symptoms in the interval. The patient voices understanding of current disease status and treatment options and is in agreement with the current care plan. All questions were answered. The patient knows to call the clinic with any problems, questions or concerns. We can certainly see the patient much sooner if necessary.  Disclaimer: This note was dictated with voice recognition software. Similar sounding words can inadvertently be transcribed and may not  be corrected upon review.

## 2017-05-27 NOTE — Patient Instructions (Signed)
Steps to Quit Smoking Smoking tobacco can be bad for your health. It can also affect almost every organ in your body. Smoking puts you and people around you at risk for many serious long-lasting (chronic) diseases. Quitting smoking is hard, but it is one of the best things that you can do for your health. It is never too late to quit. What are the benefits of quitting smoking? When you quit smoking, you lower your risk for getting serious diseases and conditions. They can include:  Lung cancer or lung disease.  Heart disease.  Stroke.  Heart attack.  Not being able to have children (infertility).  Weak bones (osteoporosis) and broken bones (fractures).  If you have coughing, wheezing, and shortness of breath, those symptoms may get better when you quit. You may also get sick less often. If you are pregnant, quitting smoking can help to lower your chances of having a baby of low birth weight. What can I do to help me quit smoking? Talk with your doctor about what can help you quit smoking. Some things you can do (strategies) include:  Quitting smoking totally, instead of slowly cutting back how much you smoke over a period of time.  Going to in-person counseling. You are more likely to quit if you go to many counseling sessions.  Using resources and support systems, such as: ? Online chats with a counselor. ? Phone quitlines. ? Printed self-help materials. ? Support groups or group counseling. ? Text messaging programs. ? Mobile phone apps or applications.  Taking medicines. Some of these medicines may have nicotine in them. If you are pregnant or breastfeeding, do not take any medicines to quit smoking unless your doctor says it is okay. Talk with your doctor about counseling or other things that can help you.  Talk with your doctor about using more than one strategy at the same time, such as taking medicines while you are also going to in-person counseling. This can help make  quitting easier. What things can I do to make it easier to quit? Quitting smoking might feel very hard at first, but there is a lot that you can do to make it easier. Take these steps:  Talk to your family and friends. Ask them to support and encourage you.  Call phone quitlines, reach out to support groups, or work with a counselor.  Ask people who smoke to not smoke around you.  Avoid places that make you want (trigger) to smoke, such as: ? Bars. ? Parties. ? Smoke-break areas at work.  Spend time with people who do not smoke.  Lower the stress in your life. Stress can make you want to smoke. Try these things to help your stress: ? Getting regular exercise. ? Deep-breathing exercises. ? Yoga. ? Meditating. ? Doing a body scan. To do this, close your eyes, focus on one area of your body at a time from head to toe, and notice which parts of your body are tense. Try to relax the muscles in those areas.  Download or buy apps on your mobile phone or tablet that can help you stick to your quit plan. There are many free apps, such as QuitGuide from the CDC (Centers for Disease Control and Prevention). You can find more support from smokefree.gov and other websites.  This information is not intended to replace advice given to you by your health care provider. Make sure you discuss any questions you have with your health care provider. Document Released: 08/11/2009 Document   Revised: 06/12/2016 Document Reviewed: 03/01/2015 Elsevier Interactive Patient Education  2018 Elsevier Inc.  

## 2017-06-10 ENCOUNTER — Ambulatory Visit (HOSPITAL_BASED_OUTPATIENT_CLINIC_OR_DEPARTMENT_OTHER): Payer: 59

## 2017-06-10 ENCOUNTER — Encounter: Payer: Self-pay | Admitting: Internal Medicine

## 2017-06-10 ENCOUNTER — Ambulatory Visit (HOSPITAL_BASED_OUTPATIENT_CLINIC_OR_DEPARTMENT_OTHER): Payer: 59 | Admitting: Internal Medicine

## 2017-06-10 ENCOUNTER — Other Ambulatory Visit (HOSPITAL_BASED_OUTPATIENT_CLINIC_OR_DEPARTMENT_OTHER): Payer: 59

## 2017-06-10 VITALS — BP 164/79 | HR 83 | Temp 97.8°F | Resp 18 | Ht 70.0 in | Wt 116.7 lb

## 2017-06-10 DIAGNOSIS — Z5112 Encounter for antineoplastic immunotherapy: Secondary | ICD-10-CM

## 2017-06-10 DIAGNOSIS — C3491 Malignant neoplasm of unspecified part of right bronchus or lung: Secondary | ICD-10-CM

## 2017-06-10 DIAGNOSIS — Z79899 Other long term (current) drug therapy: Secondary | ICD-10-CM

## 2017-06-10 DIAGNOSIS — R062 Wheezing: Secondary | ICD-10-CM | POA: Diagnosis not present

## 2017-06-10 DIAGNOSIS — I10 Essential (primary) hypertension: Secondary | ICD-10-CM

## 2017-06-10 DIAGNOSIS — R05 Cough: Secondary | ICD-10-CM | POA: Diagnosis not present

## 2017-06-10 DIAGNOSIS — C3411 Malignant neoplasm of upper lobe, right bronchus or lung: Secondary | ICD-10-CM

## 2017-06-10 LAB — COMPREHENSIVE METABOLIC PANEL
ALT: 22 U/L (ref 0–55)
ANION GAP: 9 meq/L (ref 3–11)
AST: 22 U/L (ref 5–34)
Albumin: 3.9 g/dL (ref 3.5–5.0)
Alkaline Phosphatase: 109 U/L (ref 40–150)
BILIRUBIN TOTAL: 0.29 mg/dL (ref 0.20–1.20)
BUN: 28.3 mg/dL — ABNORMAL HIGH (ref 7.0–26.0)
CALCIUM: 10 mg/dL (ref 8.4–10.4)
CHLORIDE: 106 meq/L (ref 98–109)
CO2: 25 mEq/L (ref 22–29)
CREATININE: 1 mg/dL (ref 0.7–1.3)
EGFR: 70 mL/min/{1.73_m2} — ABNORMAL LOW (ref >90)
Glucose: 93 mg/dl (ref 70–140)
Potassium: 4 mEq/L (ref 3.5–5.1)
Sodium: 140 mEq/L (ref 136–145)
TOTAL PROTEIN: 7.8 g/dL (ref 6.4–8.3)

## 2017-06-10 LAB — TSH: TSH: 0.754 m[IU]/L (ref 0.320–4.118)

## 2017-06-10 LAB — CBC WITH DIFFERENTIAL/PLATELET
BASO%: 0.6 % (ref 0.0–2.0)
BASOS ABS: 0 10*3/uL (ref 0.0–0.1)
EOS ABS: 0.2 10*3/uL (ref 0.0–0.5)
EOS%: 2.1 % (ref 0.0–7.0)
HEMATOCRIT: 43.4 % (ref 38.4–49.9)
HGB: 14.6 g/dL (ref 13.0–17.1)
LYMPH#: 0.9 10*3/uL (ref 0.9–3.3)
LYMPH%: 10.7 % — ABNORMAL LOW (ref 14.0–49.0)
MCH: 30.3 pg (ref 27.2–33.4)
MCHC: 33.7 g/dL (ref 32.0–36.0)
MCV: 89.9 fL (ref 79.3–98.0)
MONO#: 0.5 10*3/uL (ref 0.1–0.9)
MONO%: 6.6 % (ref 0.0–14.0)
NEUT#: 6.5 10*3/uL (ref 1.5–6.5)
NEUT%: 80 % — AB (ref 39.0–75.0)
PLATELETS: 219 10*3/uL (ref 140–400)
RBC: 4.83 10*6/uL (ref 4.20–5.82)
RDW: 14.5 % (ref 11.0–14.6)
WBC: 8.2 10*3/uL (ref 4.0–10.3)

## 2017-06-10 MED ORDER — SODIUM CHLORIDE 0.9 % IV SOLN
240.0000 mg | Freq: Once | INTRAVENOUS | Status: AC
  Administered 2017-06-10: 240 mg via INTRAVENOUS
  Filled 2017-06-10: qty 24

## 2017-06-10 MED ORDER — SODIUM CHLORIDE 0.9 % IV SOLN
Freq: Once | INTRAVENOUS | Status: AC
  Administered 2017-06-10: 15:00:00 via INTRAVENOUS

## 2017-06-10 NOTE — Progress Notes (Signed)
    [facility] [phone]   [phone]  OFFICE PROGRESS NOTE  Thacker, Robert, MD [address] [place] [address]  DIAGNOSIS: Metastatic non-small cell lung cancer, adenocarcinoma initially diagnosed as unresectable stage IIIa (T2a, N2, M0) in July 2016.  Foundation One molecular studies reveal that he was positive for KRAS G12V, ARIDA [id number], and STAG2 S1078fs*20. He was negative for: EGFR, ALK, BRAF, MET, RET, ERBB2 and ROS1  PRIOR THERAPY: 1) Concurrent chemoradiation with chemotherapy in the form of weekly carboplatin for AUC 2 paclitaxel 45 mg/m given concurrent with radiation therapy. First cycle started 05/09/2015. Status post 7 cycles. 2) Consolidation chemotherapy with carboplatin for AUC of 5 on day 1 and gemcitabine 1000 MG/M2 on days 1 and 8 every 3 weeks. First dose 08/15/2015. Discontinued secondary to intolerance.  CURRENT THERAPY: Immunotherapy with Nivolumab 240 mg IV every 2 weeks, status post 34 cycles.  INTERVAL HISTORY: Gabriel [name] 76 y.o. male returns to the clinic for follow-up visit accompanied by his daughter, [name]. The patient is feeling fine today with no specific complaints. He continues to tolerate his treatment with Nivolumab fairly well. He denied having any chest pain, shortness of breath except with exertion but continues to have mild cough and wheezing. He denied having any fever or chills. He has no nausea, vomiting, diarrhea or constipation. He has no significant weight loss or night sweats. His blood pressure is elevated today but he mentioned that it is usually within the normal range at home. He today for evaluation before starting cycle #35.   MEDICAL HISTORY: Past Medical History:  Diagnosis Date  . Cancer (HCC)    skin cancer  . Constipation    AFTER ANESTHESIA  . Difficulty sleeping   . History of nonmelanoma skin cancer   . Hypertension 10/30/2016  . Inguinal hernia   . Lung  mass    right / HAS HAD RADIATION AND CHEMO FOR LUNG CANCER  . Pneumothorax on right 08/24/2015  . Productive cough    "DUE TO RADIATION"  . Radiation 05/11/15-06/21/15   NSCLCA/right lung  . Smoking 1/2 pack a day or less 09/17/2016  . Spontaneous pneumothorax MANY YRS AGO AND AGAIN 04/04/15   HISTORY OF (ONLY)  . Weight loss 04/01/2017    ALLERGIES:  is allergic to aleve [naproxen].  MEDICATIONS:  Current Outpatient Prescriptions  Medication Sig Dispense Refill  . acetaminophen (TYLENOL) 325 MG tablet 2 tablets    . Aclidinium Bromide (TUDORZA PRESSAIR) 400 MCG/ACT AEPB Inhale 1 puff into the lungs 2 (two) times daily. 30 each 11  . albuterol (PROVENTIL HFA) 108 (90 Base) MCG/ACT inhaler 1-2 puffs as needed    . Arginine (RA L-ARGININE) 1000 MG TABS Take 500 mg by mouth daily.     . Ascorbic Acid (VITAMIN C) 1000 MG tablet Take 1,000 mg by mouth daily. Pt takes 2 500mg tabs    . B Complex Vitamins (B COMPLEX PO) Take 1 capsule by mouth daily.     . budesonide-formoterol (SYMBICORT) 160-4.5 MCG/ACT inhaler 2 puffs BID 1 Inhaler 5  . CALCIUM CITRATE PO Take 1 tablet by mouth daily.    . Cyanocobalamin (VITAMIN B 12 PO) Take 1 tablet by mouth daily.    . ferrous sulfate 325 (65 FE) MG tablet Take 325 mg by mouth daily with breakfast.    . guaiFENesin (MUCINEX) 600 MG 12 hr tablet Take 1 tablet (600 mg total) by mouth 2 (two) times   daily as needed.    . magic mouthwash SOLN Take 5 mLs by mouth 4 (four) times daily as needed.    . magnesium 30 MG tablet Take 30 mg by mouth daily.    . methylphenidate (RITALIN) 5 MG tablet Take 1 tablet (5 mg total) by mouth 2 (two) times daily with breakfast and lunch. (Patient not taking: Reported on 05/27/2017) 60 tablet 0  . Multiple Vitamin (MULTI VITAMIN DAILY PO) Take 1 tablet by mouth daily.    . Omega-3 Fatty Acids (FISH OIL) 1000 MG CAPS Take 1,000 mg by mouth daily.    . ondansetron (ZOFRAN) 8 MG tablet Take 1 tablet (8 mg total) by mouth every 8  (eight) hours as needed for nausea or vomiting. (Patient not taking: Reported on 05/27/2017) 20 tablet 0  . prochlorperazine (COMPAZINE) 10 MG tablet Take 1 tablet (10 mg total) by mouth every 6 (six) hours as needed for nausea or vomiting. (Patient not taking: Reported on 05/27/2017) 30 tablet 0  . saccharomyces boulardii (FLORASTOR) 250 MG capsule Take 250 mg by mouth 2 (two) times daily.    . vitamin A 10000 UNIT capsule 1 capsule with food or milk     No current facility-administered medications for this visit.     SURGICAL HISTORY:  Past Surgical History:  Procedure Laterality Date  . CHEST TUBE INSERTION Right 08/02/2015   Procedure: INSERTION OF SECOND RIGHT CHEST TUBE WITH FLUROSCOPY;  Surgeon: Edward B Gerhardt, MD;  Location: [facility] OR;  Service: Thoracic;  Laterality: Right;  . CHEST TUBE INSERTION  07/28/2015  . COLONOSCOPY W/ BIOPSIES AND POLYPECTOMY    . HERNIA REPAIR  2011  . INGUINAL HERNIA REPAIR Left 07/15/2015   Procedure: OPEN LEFT INGUINAL HERNIA REPAIR;  Surgeon: Matthew Martin, MD;  Location: [facility];  Service: General;  Laterality: Left;  With MESH  . TONSILLECTOMY    . VIDEO BRONCHOSCOPY WITH ENDOBRONCHIAL ULTRASOUND N/A 04/18/2015   Procedure: VIDEO BRONCHOSCOPY WITH ENDOBRONCHIAL ULTRASOUND;  Surgeon: Edward B Gerhardt, MD;  Location: [facility] OR;  Service: Thoracic;  Laterality: N/A;  . VIDEO BRONCHOSCOPY WITH INSERTION OF INTERBRONCHIAL VALVE (IBV) N/A 08/02/2015   Procedure: VIDEO BRONCHOSCOPY WITH ATTEMPTED INSERTION OF INTERBRONCHIAL VALVE (IBV) WITH FLUROSCOPY;  Surgeon: Edward B Gerhardt, MD;  Location: [facility] OR;  Service: Thoracic;  Laterality: N/A;  . WISDOM TOOTH EXTRACTION      REVIEW OF SYSTEMS:  A comprehensive review of systems was negative except for: Constitutional: positive for fatigue Respiratory: positive for cough and dyspnea on exertion   PHYSICAL EXAMINATION: General appearance: alert, cooperative, fatigued and no distress Head: Normocephalic, without  obvious abnormality, atraumatic Neck: no adenopathy, no JVD, supple, symmetrical, trachea midline and thyroid not enlarged, symmetric, no tenderness/mass/nodules Lymph nodes: Cervical, supraclavicular, and axillary nodes normal. Resp: wheezes RUL Back: symmetric, no curvature. ROM normal. No CVA tenderness. Cardio: regular rate and rhythm, S1, S2 normal, no murmur, click, rub or gallop GI: soft, non-tender; bowel sounds normal; no masses,  no organomegaly Extremities: extremities normal, atraumatic, no cyanosis or edema  ECOG PERFORMANCE STATUS: 1 - Symptomatic but completely ambulatory  Blood pressure (!) 164/79, pulse 83, temperature 97.8 F (36.6 C), temperature source Oral, resp. rate 18, height 5' 10" (1.778 m), weight 116 lb 11.2 oz (52.9 kg), SpO2 100 %.  LABORATORY DATA: Lab Results  Component Value Date   WBC 8.2 06/10/2017   HGB 14.6 06/10/2017   HCT 43.4 06/10/2017   MCV 89.9 06/10/2017   PLT 219 06/10/2017        Chemistry      Component Value Date/Time   NA 140 05/27/2017 1337   K 3.9 05/27/2017 1337   CL 102 09/12/2015 0410   CO2 26 05/27/2017 1337   BUN 28.9 (H) 05/27/2017 1337   CREATININE 0.9 05/27/2017 1337      Component Value Date/Time   CALCIUM 10.2 05/27/2017 1337   ALKPHOS 109 05/27/2017 1337   AST 22 05/27/2017 1337   ALT 27 05/27/2017 1337   BILITOT 0.25 05/27/2017 1337       RADIOGRAPHIC STUDIES: Ct Chest W Contrast  Result Date: 05/24/2017 CLINICAL DATA:  Stage III non-small cell right lung cancer diagnosed in June 2016. Chemotherapy and radiation therapy completed. Immunotherapy ongoing. EXAM: CT CHEST, ABDOMEN, AND PELVIS WITH CONTRAST TECHNIQUE: Multidetector CT imaging of the chest, abdomen and pelvis was performed following the standard protocol during bolus administration of intravenous contrast. CONTRAST:  169m ISOVUE-300 IOPAMIDOL (ISOVUE-300) INJECTION 61% COMPARISON:  Prior CT 03/29/2017 and 08/31/2016. FINDINGS: CT CHEST FINDINGS  Cardiovascular: There is atherosclerosis of the aorta, great vessels and coronary arteries. No acute vascular findings are demonstrated. There is poor opacification of the right upper lobe pulmonary vessels, similar to previous study. Chronically dilated veins are present in the right axilla. The heart size is normal. There is no pericardial effusion. Mediastinum/Nodes: Several right paratracheal lymph nodes are unchanged, measuring up to 10 mm on image 19. There is also stable soft tissue thickening around the right hilum, including a 2.8 x 1.8 cm component along the posterior wall of the bronchus intermedius (image 27). No progressive adenopathy demonstrated. There is stable tracheal deviation to the right. The esophagus appears unremarkable. Lungs/Pleura: There is chronic volume loss in the right hemithorax with central bronchiectasis and upper lobe necrosis. There is also chronic volume loss in the right lower lobe where there are multiple slowly enlarging pulmonary nodules, highly worrisome for metastatic disease. Representative nodules in the right lower lobe included 19 mm nodule on image 62, a 9 mm nodule on image 92 and an 8 mm nodule on image 98. 7 mm solid nodule in the superior segment of the left lower lobe (image 52) also demonstrates slow growth. Multiple ground-glass and sub solid nodules in both lungs are grossly stable. These include a 14 x 11 mm left upper lobe lesion on image number 19 and a 23 x 16 mm left lower lobe lesion on image 125. Pleural thickening at the right apex appears stable. There is irregular right paraspinal soft tissue thickening at T11-12 with some neural foraminal extension (measuring up to 3.7 x 1.3 cm on axial image 43). This has not significantly changed from recent studies and may in part be secondary to prior radiation or treated tumor. No significant intraspinal extension of tumor. Musculoskeletal/Chest wall: Chronic T8 compression deformity. No lytic lesion or bone  destruction identified. As above, there is possible partially treated tumor on the right at T11-12. CT ABDOMEN AND PELVIS FINDINGS Hepatobiliary: The liver is normal in density without focal abnormality. No evidence of gallstones, gallbladder wall thickening or biliary dilatation. Pancreas: Atrophied without focal abnormality, ductal dilatation or surrounding inflammation. Spleen: Normal in size without focal abnormality. Adrenals/Urinary Tract: Stable small low-density right adrenal adenoma. The left adrenal gland appears normal. The kidneys appear normal without evidence of urinary tract calculus, suspicious lesion or hydronephrosis. No bladder abnormalities are seen. Stomach/Bowel: No evidence of bowel wall thickening, distention or surrounding inflammatory change. Vascular/Lymphatic: There are no enlarged abdominal or pelvic lymph nodes. Diffuse aortic and branch  vessel atherosclerosis. Reproductive: Stable mild enlargement and heterogeneity of the prostate gland. Other: No ascites or peritoneal nodularity. Musculoskeletal: Slight progression of sclerotic lesion posteriorly in the right iliac bone, measuring 18 mm on image 84 (previously 16 mm). There is also probable 17 mm left sacral sclerotic metastasis (image number 88). No lytic lesion or pathologic fracture. IMPRESSION: 1. Progressive pulmonary nodularity bilaterally, greatest in the right lower lobe, highly worrisome for progressive metastatic disease. 2. Right paraspinal soft tissue extending into the neuroforamen at T11-12, suspicious for tumor. This may be partially treated. 3. Progressive sclerotic metastases in the bony pelvis. No lytic lesion or pathologic fracture. 4. Stable treatment changes in the right upper lobe. 5. Additional ground-glass and sub solid nodules in both lungs are grossly stable. 6. No evidence extra osseous metastatic disease in the abdomen or pelvis. 7. Follow-up PET-CT may be helpful for further staging. Electronically Signed    By: William  Veazey M.D.   On: 05/24/2017 09:46   Ct Abdomen Pelvis W Contrast  Result Date: 05/24/2017 CLINICAL DATA:  Stage III non-small cell right lung cancer diagnosed in June 2016. Chemotherapy and radiation therapy completed. Immunotherapy ongoing. EXAM: CT CHEST, ABDOMEN, AND PELVIS WITH CONTRAST TECHNIQUE: Multidetector CT imaging of the chest, abdomen and pelvis was performed following the standard protocol during bolus administration of intravenous contrast. CONTRAST:  100mL ISOVUE-300 IOPAMIDOL (ISOVUE-300) INJECTION 61% COMPARISON:  Prior CT 03/29/2017 and 08/31/2016. FINDINGS: CT CHEST FINDINGS Cardiovascular: There is atherosclerosis of the aorta, great vessels and coronary arteries. No acute vascular findings are demonstrated. There is poor opacification of the right upper lobe pulmonary vessels, similar to previous study. Chronically dilated veins are present in the right axilla. The heart size is normal. There is no pericardial effusion. Mediastinum/Nodes: Several right paratracheal lymph nodes are unchanged, measuring up to 10 mm on image 19. There is also stable soft tissue thickening around the right hilum, including a 2.8 x 1.8 cm component along the posterior wall of the bronchus intermedius (image 27). No progressive adenopathy demonstrated. There is stable tracheal deviation to the right. The esophagus appears unremarkable. Lungs/Pleura: There is chronic volume loss in the right hemithorax with central bronchiectasis and upper lobe necrosis. There is also chronic volume loss in the right lower lobe where there are multiple slowly enlarging pulmonary nodules, highly worrisome for metastatic disease. Representative nodules in the right lower lobe included 19 mm nodule on image 62, a 9 mm nodule on image 92 and an 8 mm nodule on image 98. 7 mm solid nodule in the superior segment of the left lower lobe (image 52) also demonstrates slow growth. Multiple ground-glass and sub solid nodules in  both lungs are grossly stable. These include a 14 x 11 mm left upper lobe lesion on image number 19 and a 23 x 16 mm left lower lobe lesion on image 125. Pleural thickening at the right apex appears stable. There is irregular right paraspinal soft tissue thickening at T11-12 with some neural foraminal extension (measuring up to 3.7 x 1.3 cm on axial image 43). This has not significantly changed from recent studies and may in part be secondary to prior radiation or treated tumor. No significant intraspinal extension of tumor. Musculoskeletal/Chest wall: Chronic T8 compression deformity. No lytic lesion or bone destruction identified. As above, there is possible partially treated tumor on the right at T11-12. CT ABDOMEN AND PELVIS FINDINGS Hepatobiliary: The liver is normal in density without focal abnormality. No evidence of gallstones, gallbladder wall thickening or biliary   dilatation. Pancreas: Atrophied without focal abnormality, ductal dilatation or surrounding inflammation. Spleen: Normal in size without focal abnormality. Adrenals/Urinary Tract: Stable small low-density right adrenal adenoma. The left adrenal gland appears normal. The kidneys appear normal without evidence of urinary tract calculus, suspicious lesion or hydronephrosis. No bladder abnormalities are seen. Stomach/Bowel: No evidence of bowel wall thickening, distention or surrounding inflammatory change. Vascular/Lymphatic: There are no enlarged abdominal or pelvic lymph nodes. Diffuse aortic and branch vessel atherosclerosis. Reproductive: Stable mild enlargement and heterogeneity of the prostate gland. Other: No ascites or peritoneal nodularity. Musculoskeletal: Slight progression of sclerotic lesion posteriorly in the right iliac bone, measuring 18 mm on image 84 (previously 16 mm). There is also probable 17 mm left sacral sclerotic metastasis (image number 88). No lytic lesion or pathologic fracture. IMPRESSION: 1. Progressive pulmonary  nodularity bilaterally, greatest in the right lower lobe, highly worrisome for progressive metastatic disease. 2. Right paraspinal soft tissue extending into the neuroforamen at T11-12, suspicious for tumor. This may be partially treated. 3. Progressive sclerotic metastases in the bony pelvis. No lytic lesion or pathologic fracture. 4. Stable treatment changes in the right upper lobe. 5. Additional ground-glass and sub solid nodules in both lungs are grossly stable. 6. No evidence extra osseous metastatic disease in the abdomen or pelvis. 7. Follow-up PET-CT may be helpful for further staging. Electronically Signed   By: William  Veazey M.D.   On: 05/24/2017 09:46    ASSESSMENT AND PLAN: This is a very pleasant 76 years old white male with metastatic non-small cell lung cancer, adenocarcinoma status post short course of concurrent chemoradiation with weekly carboplatin and paclitaxel followed by 1 cycle of consolidation chemotherapy discontinued secondary to intolerance. He is currently undergoing treatment with immunotherapy with Nivolumab status post 34 cycles. He is tolerating the treatment well. I recommended for the patient to proceed with cycle #35 today as scheduled. I will consider changing his treatment to Nivolumab 480 mg every 4 weeks starting from the next cycle after we receive approval from his insurance company. This will be convenient for the patient who continues to work. The patient and his daughter agreed to the current plan. He was advised to call immediately if he has any concerning symptoms in the interval. The patient voices understanding of current disease status and treatment options and is in agreement with the current care plan. All questions were answered. The patient knows to call the clinic with any problems, questions or concerns. We can certainly see the patient much sooner if necessary. I spent 10 minutes counseling the patient face to face. The total time spent in the  appointment was 15 minutes.  Disclaimer: This note was dictated with voice recognition software. Similar sounding words can inadvertently be transcribed and may not be corrected upon review.       

## 2017-06-10 NOTE — Patient Instructions (Signed)
[facility] Discharge Instructions for Patients Receiving Chemotherapy  Today you received the following chemotherapy agents Nivolumab.  To help prevent nausea and vomiting after your treatment, we encourage you to take your nausea medication as prescribed.   If you develop nausea and vomiting that is not controlled by your nausea medication, call the clinic.   BELOW ARE SYMPTOMS THAT SHOULD BE REPORTED IMMEDIATELY:  *FEVER GREATER THAN 100.5 F  *CHILLS WITH OR WITHOUT FEVER  NAUSEA AND VOMITING THAT IS NOT CONTROLLED WITH YOUR NAUSEA MEDICATION  *UNUSUAL SHORTNESS OF BREATH  *UNUSUAL BRUISING OR BLEEDING  TENDERNESS IN MOUTH AND THROAT WITH OR WITHOUT PRESENCE OF ULCERS  *URINARY PROBLEMS  *BOWEL PROBLEMS  UNUSUAL RASH Items with * indicate a potential emergency and should be followed up as soon as possible.  Feel free to call the clinic you have any questions or concerns. The clinic phone number is [phone].  Please show the CHEMO ALERT CARD at check-in to the Emergency Department and triage nurse.   

## 2017-06-11 ENCOUNTER — Other Ambulatory Visit: Payer: Self-pay | Admitting: Internal Medicine

## 2017-06-18 ENCOUNTER — Telehealth: Payer: Self-pay | Admitting: Internal Medicine

## 2017-06-18 NOTE — Telephone Encounter (Signed)
Received the PA for the tudorza.  This has been sent in via [email or web address] and the paper from the pharmacy has been placed in the blue folder in triage.   Will route to EE to follow up on PA.

## 2017-06-24 ENCOUNTER — Telehealth: Payer: Self-pay | Admitting: Oncology

## 2017-06-24 ENCOUNTER — Ambulatory Visit: Payer: 59

## 2017-06-24 ENCOUNTER — Ambulatory Visit (HOSPITAL_BASED_OUTPATIENT_CLINIC_OR_DEPARTMENT_OTHER): Payer: 59 | Admitting: Oncology

## 2017-06-24 ENCOUNTER — Encounter: Payer: Self-pay | Admitting: Oncology

## 2017-06-24 ENCOUNTER — Other Ambulatory Visit: Payer: Self-pay | Admitting: Medical Oncology

## 2017-06-24 ENCOUNTER — Ambulatory Visit (HOSPITAL_BASED_OUTPATIENT_CLINIC_OR_DEPARTMENT_OTHER): Payer: 59

## 2017-06-24 ENCOUNTER — Other Ambulatory Visit: Payer: Self-pay | Admitting: Internal Medicine

## 2017-06-24 VITALS — BP 149/79 | HR 97 | Temp 98.1°F | Resp 19 | Ht 70.0 in | Wt 115.4 lb

## 2017-06-24 DIAGNOSIS — C3491 Malignant neoplasm of unspecified part of right bronchus or lung: Secondary | ICD-10-CM

## 2017-06-24 DIAGNOSIS — R0609 Other forms of dyspnea: Secondary | ICD-10-CM | POA: Diagnosis not present

## 2017-06-24 DIAGNOSIS — R05 Cough: Secondary | ICD-10-CM | POA: Diagnosis not present

## 2017-06-24 DIAGNOSIS — R062 Wheezing: Secondary | ICD-10-CM | POA: Diagnosis not present

## 2017-06-24 DIAGNOSIS — Z7189 Other specified counseling: Secondary | ICD-10-CM | POA: Insufficient documentation

## 2017-06-24 DIAGNOSIS — Z720 Tobacco use: Secondary | ICD-10-CM

## 2017-06-24 DIAGNOSIS — C3411 Malignant neoplasm of upper lobe, right bronchus or lung: Secondary | ICD-10-CM

## 2017-06-24 DIAGNOSIS — Z5112 Encounter for antineoplastic immunotherapy: Secondary | ICD-10-CM

## 2017-06-24 LAB — CBC WITH DIFFERENTIAL/PLATELET
BASO%: 0.7 % (ref 0.0–2.0)
BASOS ABS: 0.1 10*3/uL (ref 0.0–0.1)
EOS ABS: 0.2 10*3/uL (ref 0.0–0.5)
EOS%: 2.1 % (ref 0.0–7.0)
HCT: 41.1 % (ref 38.4–49.9)
HEMOGLOBIN: 13.7 g/dL (ref 13.0–17.1)
LYMPH%: 10 % — ABNORMAL LOW (ref 14.0–49.0)
MCH: 30 pg (ref 27.2–33.4)
MCHC: 33.4 g/dL (ref 32.0–36.0)
MCV: 89.7 fL (ref 79.3–98.0)
MONO#: 0.5 10*3/uL (ref 0.1–0.9)
MONO%: 7 % (ref 0.0–14.0)
NEUT#: 6.2 10*3/uL (ref 1.5–6.5)
NEUT%: 80.2 % — ABNORMAL HIGH (ref 39.0–75.0)
Platelets: 228 10*3/uL (ref 140–400)
RBC: 4.58 10*6/uL (ref 4.20–5.82)
RDW: 14.5 % (ref 11.0–14.6)
WBC: 7.7 10*3/uL (ref 4.0–10.3)
lymph#: 0.8 10*3/uL — ABNORMAL LOW (ref 0.9–3.3)

## 2017-06-24 LAB — COMPREHENSIVE METABOLIC PANEL
ALBUMIN: 3.8 g/dL (ref 3.5–5.0)
ALK PHOS: 105 U/L (ref 40–150)
ALT: 20 U/L (ref 0–55)
AST: 19 U/L (ref 5–34)
Anion Gap: 9 mEq/L (ref 3–11)
BUN: 28.7 mg/dL — ABNORMAL HIGH (ref 7.0–26.0)
CALCIUM: 10 mg/dL (ref 8.4–10.4)
CO2: 26 mEq/L (ref 22–29)
Chloride: 105 mEq/L (ref 98–109)
Creatinine: 1 mg/dL (ref 0.7–1.3)
EGFR: 71 mL/min/{1.73_m2} — AB (ref >90)
GLUCOSE: 105 mg/dL (ref 70–140)
POTASSIUM: 4 meq/L (ref 3.5–5.1)
SODIUM: 139 meq/L (ref 136–145)
Total Bilirubin: 0.28 mg/dL (ref 0.20–1.20)
Total Protein: 7.6 g/dL (ref 6.4–8.3)

## 2017-06-24 MED ORDER — SODIUM CHLORIDE 0.9 % IV SOLN
Freq: Once | INTRAVENOUS | Status: AC
  Administered 2017-06-24: 15:00:00 via INTRAVENOUS

## 2017-06-24 MED ORDER — NIVOLUMAB CHEMO INJECTION 100 MG/10ML
480.0000 mg | Freq: Once | INTRAVENOUS | Status: AC
  Administered 2017-06-24: 480 mg via INTRAVENOUS
  Filled 2017-06-24: qty 48

## 2017-06-24 NOTE — Progress Notes (Signed)
Patient on plan of care prior to pathways. 

## 2017-06-24 NOTE — Progress Notes (Signed)
Pt tolerated infusion well.

## 2017-06-24 NOTE — Telephone Encounter (Signed)
Scheduled appt per 8/27 los - patient is aware of appt dates and time.

## 2017-06-24 NOTE — Patient Instructions (Signed)
[facility] Discharge Instructions for Patients Receiving Chemotherapy  Today you received the following chemotherapy agents:  Nivolumab.  To help prevent nausea and vomiting after your treatment, we encourage you to take your nausea medication as directed.   If you develop nausea and vomiting that is not controlled by your nausea medication, call the clinic.   BELOW ARE SYMPTOMS THAT SHOULD BE REPORTED IMMEDIATELY:  *FEVER GREATER THAN 100.5 F  *CHILLS WITH OR WITHOUT FEVER  NAUSEA AND VOMITING THAT IS NOT CONTROLLED WITH YOUR NAUSEA MEDICATION  *UNUSUAL SHORTNESS OF BREATH  *UNUSUAL BRUISING OR BLEEDING  TENDERNESS IN MOUTH AND THROAT WITH OR WITHOUT PRESENCE OF ULCERS  *URINARY PROBLEMS  *BOWEL PROBLEMS  UNUSUAL RASH Items with * indicate a potential emergency and should be followed up as soon as possible.  Feel free to call the clinic you have any questions or concerns. The clinic phone number is [phone].  Please show the CHEMO ALERT CARD at check-in to the Emergency Department and triage nurse.   

## 2017-06-24 NOTE — Progress Notes (Signed)
[facility] Follow up:    Gabriel Dials, MD [phone] [address] [place] [address]   DIAGNOSIS: Cancer Staging Non-small cell carcinoma of right lung, stage 3 (HCC) Staging form: Lung, AJCC 7th Edition - Clinical stage from 04/21/2015: Stage IIIA (T2a, N2, M0) - Signed by Curt Bears, MD on 04/21/2015 Metastatic non-small cell lung cancer, adenocarcinoma initially diagnosed as unresectable stage IIIa (T2a, N2, M0) in July 2016.  Foundation One molecular studies reveal that he was positive for [place], [place], and STAG2 [id number]. He was negative for: EGFR, ALK, BRAF, MET, RET, ERBB2 and ROS1  SUMMARY OF ONCOLOGIC HISTORY: Oncology History   Patient presented to ED with SOB and right sided CP.  Work up showed hydropneumothorax.   Non-small cell carcinoma of right lung, stage 3   Staging form: Lung, AJCC 7th Edition     Clinical stage from 04/21/2015: Stage IIIA (T2a, N2, M0) - Signed by MCurt Bears MD on 04/21/2015       Non-small cell carcinoma of right lung, stage 3 ([place]   04/06/2015 Imaging    CT Chest IMPRESSION: Tiny less than 5% right pneumothorax with right chest tube in place. 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  4.7 cm spiculated masslike opacity in central right low      04/12/2015 Imaging    PET scan  4.0 x 3.0 cm hypermetabolic right perihilar mass-like opacity, suspicious for primary bronchogenic neoplasm, as described above. Consider bronchoscopy for further evaluation.  Multiple nodular/cavitary opacities in the left lung      04/18/2015 Pathology Results    1. Lung, biopsy, right middle lobe - NON-SMALL CELL CARCINOMA, CONSISTENT WITH ADENOCARCINOMA. - SEE COMMENT. 2. Lung, biopsy, right lower lobe - NON-SMALL CELL CARCINOMA, CONSISTENT WITH ADENOCARCINOMA      04/18/2015 Surgery    PROCEDURES PERFORMED: Bronchoscopy with biopsy of right  lower and right middle lobe bronchus and EBUS with transbronchial biopsy of 10R lymph nodes.      04/21/2015 Initial Diagnosis    Non-small cell carcinoma of right lung, stage 3      PRIOR THERAPY: 1) Concurrent chemoradiation with chemotherapy in the form of weekly carboplatin for AUC 2 paclitaxel 45 mg/m given concurrent with radiation therapy. First cycle started 05/09/2015. Status post 7 cycles. 2) Consolidation chemotherapy with carboplatin for AUC of 5 on day 1 and gemcitabine 1000 MG/M2 on days 1 and 8 every 3 weeks. First dose 08/15/2015. Discontinued secondary to intolerance.  CURRENT THERAPY: Immunotherapy with Nivolumab 240 mg IV every 2 weeks, status post 35 cycles.  INTERVAL HISTORY: Gabriel [id number].[id number]. male returns for routine follow-up by himself. The patient is feeling well today with no specific complaints. He continues to tolerate his treatment with Nivolumab fairly well. He denies having any chest pain, shortness of breath at rest. He does report dyspnea on exertion as well as a cough and wheezing. Denies hemoptysis. He denies any fevers and chills. No significant weight loss or night sweats. Denies nausea, vomiting, diarrhea, constipation. The patient is here for evaluation prior to starting cycle #36 of Nivolumab.   Patient Active Problem List   Diagnosis Date Noted  . Malignant neoplasm of upper lobe of right lung ([place] 05/27/2017  . H/O vitamin D deficiency 05/22/2017  . Weight loss 04/01/2017  . Protein-calorie malnutrition ([place] 11/26/2016  . Acute bronchitis 11/12/2016  . Hypertension 10/30/2016  . Smoking 1/2 pack  a day or less 09/17/2016  . Neoplastic malignant related fatigue 05/15/2016  . Stage 2 moderate COPD by GOLD classification ([place]) 02/14/2016  . Hoarseness of voice 02/14/2016  . Encounter for antineoplastic immunotherapy 02/01/2016  . Pressure ulcer 09/14/2015  . Palliative care encounter 09/07/2015  . Other fatigue   . Right-sided chest pain  08/31/2015  . COPD exacerbation ([place]) 08/04/2015  . Pulmonary emphysema ([place]) 07/28/2015  . Malignant neoplasm of lung ([place]) 07/28/2015  . Encounter for antineoplastic chemotherapy 05/09/2015  . Non-small cell carcinoma of right lung, stage 3 ([place]) 04/21/2015  . Hemoptysis, unspecified 10/07/2013    is allergic to aleve [naproxen].  MEDICAL HISTORY: Past Medical History:  Diagnosis Date  . Cancer ([place])    skin cancer  . Constipation    AFTER ANESTHESIA  . Difficulty sleeping   . History of nonmelanoma skin cancer   . Hypertension 10/30/2016  . Inguinal hernia   . Lung mass    right / HAS HAD RADIATION AND CHEMO FOR LUNG CANCER  . Pneumothorax on right 08/24/2015  . Productive cough    "DUE TO RADIATION"  . Radiation 05/11/15-06/21/15   NSCLCA/right lung  . Smoking 1/2 pack a day or less 09/17/2016  . Spontaneous pneumothorax MANY YRS AGO AND AGAIN 04/04/15   HISTORY OF (ONLY)  . Weight loss 04/01/2017    SURGICAL HISTORY: Past Surgical History:  Procedure Laterality Date  . CHEST TUBE INSERTION Right 08/02/2015   Procedure: INSERTION OF SECOND RIGHT CHEST TUBE WITH FLUROSCOPY;  Surgeon: Grace Isaac, MD;  Location: [facility];  Service: Thoracic;  Laterality: Right;  . CHEST TUBE INSERTION  07/28/2015  . COLONOSCOPY W/ BIOPSIES AND POLYPECTOMY    . HERNIA REPAIR  2011  . INGUINAL HERNIA REPAIR Left 07/15/2015   Procedure: OPEN LEFT INGUINAL HERNIA REPAIR;  Surgeon: Johnathan Hausen, MD;  Location: [facility];  Service: General;  Laterality: Left;  With MESH  . TONSILLECTOMY    . VIDEO BRONCHOSCOPY WITH ENDOBRONCHIAL ULTRASOUND N/A 04/18/2015   Procedure: VIDEO BRONCHOSCOPY WITH ENDOBRONCHIAL ULTRASOUND;  Surgeon: Grace Isaac, MD;  Location: [facility];  Service: Thoracic;  Laterality: N/A;  . VIDEO BRONCHOSCOPY WITH INSERTION OF INTERBRONCHIAL VALVE (IBV) N/A 08/02/2015   Procedure: VIDEO BRONCHOSCOPY WITH ATTEMPTED INSERTION OF INTERBRONCHIAL VALVE (IBV) WITH FLUROSCOPY;  Surgeon:  Grace Isaac, MD;  Location: [facility];  Service: Thoracic;  Laterality: N/A;  . WISDOM TOOTH EXTRACTION      SOCIAL HISTORY: Social History   Social History  . Marital status: Divorced    Spouse name: N/A  . Number of children: N/A  . Years of education: N/A   Occupational History  . engineer [employer]   Social History Main Topics  . Smoking status: Current Every Day Smoker    Packs/day: 0.25    Years: 55.00    Types: Cigarettes  . Smokeless tobacco: Never Used     Comment: 1/2 ppd 7.25.18 ee  . Alcohol use 0.0 oz/week     Comment: every other night -social  . Drug use: No  . Sexual activity: Not Currently   Other Topics Concern  . Not on file   Social History Narrative  . No narrative on file    FAMILY HISTORY: Family History  Problem Relation Age of Onset  . Heart attack Father   . Heart disease Father   . Hypertension Mother     Review of Systems  Constitutional: Negative.   HENT:  Negative.  Eyes: Negative.   Respiratory: Positive for cough and wheezing. Negative for hemoptysis.        Shortness of breath with exertion  Cardiovascular: Negative.   Gastrointestinal: Negative.   Genitourinary: Negative.    Musculoskeletal: Negative.   Skin: Negative.   Neurological: Negative.   Hematological: Negative.   Psychiatric/Behavioral: Negative.       PHYSICAL EXAMINATION  ECOG PERFORMANCE STATUS: 1 - Symptomatic but completely ambulatory  Vitals:   06/24/17 1345  BP: (!) 149/79  Pulse: 97  Resp: 19  Temp: 98.1 F (36.7 C)  SpO2: 100%    Physical Exam  Constitutional: He is oriented to person, place, and time and well-developed, well-nourished, and in no distress. No distress.  HENT:  Head: Normocephalic.  Mouth/Throat: Oropharynx is clear and moist. No oropharyngeal exudate.  Eyes: Conjunctivae are normal. Right eye exhibits no discharge. Left eye exhibits no discharge. No scleral icterus.  Neck: Normal range of motion. Neck  supple.  Cardiovascular: Normal rate, regular rhythm, normal heart sounds and intact distal pulses.   Pulmonary/Chest: Effort normal and breath sounds normal. No respiratory distress. He has no wheezes. He exhibits no tenderness.  Abdominal: Soft. Bowel sounds are normal. He exhibits no distension and no mass. There is no tenderness.  Musculoskeletal: Normal range of motion. He exhibits no edema.  Lymphadenopathy:    He has no cervical adenopathy.  Neurological: He is alert and oriented to person, place, and time. He exhibits normal muscle tone. Gait normal.  Skin: Skin is warm and dry. No rash noted. He is not diaphoretic. No erythema. No pallor.  Psychiatric: Mood, memory, affect and judgment normal.  Vitals reviewed.   LABORATORY DATA:  CBC    Component Value Date/Time   WBC 7.7 06/24/2017 1324   WBC 11.3 (H) 09/12/2015 0410   RBC 4.58 06/24/2017 1324   RBC 3.09 (L) 09/12/2015 0410   HGB 13.7 06/24/2017 1324   HCT 41.1 06/24/2017 1324   PLT 228 06/24/2017 1324   MCV 89.7 06/24/2017 1324   MCH 30.0 06/24/2017 1324   MCH 28.8 09/12/2015 0410   MCHC 33.4 06/24/2017 1324   MCHC 32.0 09/12/2015 0410   RDW 14.5 06/24/2017 1324   LYMPHSABS 0.8 (L) 06/24/2017 1324   MONOABS 0.5 06/24/2017 1324   EOSABS 0.2 06/24/2017 1324   BASOSABS 0.1 06/24/2017 1324    CMP     Component Value Date/Time   NA 139 06/24/2017 1324   K 4.0 06/24/2017 1324   CL 102 09/12/2015 0410   CO2 26 06/24/2017 1324   GLUCOSE 105 06/24/2017 1324   BUN 28.7 (H) 06/24/2017 1324   CREATININE 1.0 06/24/2017 1324   CALCIUM 10.0 06/24/2017 1324   PROT 7.6 06/24/2017 1324   ALBUMIN 3.8 06/24/2017 1324   AST 19 06/24/2017 1324   ALT 20 06/24/2017 1324   ALKPHOS 105 06/24/2017 1324   BILITOT 0.28 06/24/2017 1324   GFRNONAA >60 09/13/2015 0457   GFRAA >60 09/13/2015 0457     RADIOGRAPHIC STUDIES:  No results found.   ASSESSMENT and THERAPY PLAN:   Non-small cell carcinoma of right lung, stage 3  (HCC) This is a very pleasant 76 years old white male with metastatic non-small cell lung cancer, adenocarcinoma status post short course of concurrent chemoradiation with weekly carboplatin and paclitaxel followed by 1 cycle of consolidation chemotherapy discontinued secondary to intolerance. He is currently undergoing treatment with immunotherapy with Nivolumab status post 35 cycles. He is tolerating the treatment well.  Patient was  seen with Dr. Julien Nordmann. Recommended for the patient to proceed with cycle #36 today as scheduled. We will change his treatment to Nivolumab 480 mg every 4 weeks starting with this cycle. This will be convenient for the patient who continues to work. The patient agrees to the current plan.  Patient will return in 4 weeks for evaluation prior to cycle 37 of Nivolumab. He will have restaging CT scans prior to his next visit and these will be reviewed with him at that time.   Orders Placed This Encounter  Procedures  . CT ABDOMEN PELVIS W CONTRAST    Standing Status:   Future    Standing Expiration Date:   06/24/2018    Order Specific Question:   If indicated for the ordered procedure, I authorize the administration of contrast media per Radiology protocol    Answer:   Yes    Order Specific Question:   Preferred imaging location?    Answer:   [facility]    Order Specific Question:   Radiology Contrast Protocol - do NOT remove file path    Answer:   [email or web address]    Order Specific Question:   Reason for Exam additional comments    Answer:   lung cancer restaging  . CT CHEST W CONTRAST    Standing Status:   Future    Standing Expiration Date:   06/24/2018    Order Specific Question:   If indicated for the ordered procedure, I authorize the administration of contrast media per Radiology protocol    Answer:   Yes    Order Specific Question:   Preferred imaging location?    Answer:   [facility]    Order Specific  Question:   Radiology Contrast Protocol - do NOT remove file path    Answer:   \\[email or web address]    Order Specific Question:   Reason for Exam additional comments    Answer:   lung cancer restaging.  Marland Kitchen CBC with Differential/Platelet    Standing Status:   Standing    Number of Occurrences:   10    Standing Expiration Date:   06/24/2018  . Comprehensive metabolic panel    Standing Status:   Standing    Number of Occurrences:   10    Standing Expiration Date:   06/24/2018  . TSH    Standing Status:   Standing    Number of Occurrences:   8    Standing Expiration Date:   06/24/2018    All questions were answered. The patient knows to call the clinic with any problems, questions or concerns. We can certainly see the patient much sooner if necessary.  Mikey Bussing, NP  06/24/2017   ADDENDUM: Hematology/Oncology Attending: I had a face to face encounter with the patient today. I recommended his care plan. This is a very pleasant 76 years old white male with metastatic non-small cell lung cancer, adenocarcinoma. He is currently undergoing treatment with immunotherapy with Nivolumab 240 mg IV every 2 weeks status post 36 cycles. The patient has been tolerating his treatment fairly well with no significant adverse effects. I recommended for him to continue his current treatment with Nivolumab but we will change the frequency and dosed to 480 MG IV every 4 weeks for convenience of the patient. He is expected to start the first dose of this treatment today. He would come back for follow-up visit in 4 weeks for evaluation before the next dose of his treatment after repeating CT scan  of the chest, abdomen and pelvis for restaging of his disease. The patient was advised to call immediately if he has any concerning symptoms in the interval.  Disclaimer: This note was dictated with voice recognition software. Similar sounding words can inadvertently be transcribed and may be  missed upon review. Gabriel Kempf, MD 06/24/17

## 2017-06-24 NOTE — Assessment & Plan Note (Signed)
This is a very pleasant 76 years old white male with metastatic non-small cell lung cancer, adenocarcinoma status post short course of concurrent chemoradiation with weekly carboplatin and paclitaxel followed by 1 cycle of consolidation chemotherapy discontinued secondary to intolerance. He is currently undergoing treatment with immunotherapy with Nivolumab status post 35 cycles. He is tolerating the treatment well.  Patient was seen with Dr. Julien Nordmann. Recommended for the patient to proceed with cycle #36 today as scheduled. We will change his treatment to Nivolumab 480 mg every 4 weeks starting with this cycle. This will be convenient for the patient who continues to work. The patient agrees to the current plan.  Patient will return in 4 weeks for evaluation prior to cycle 37 of Nivolumab. He will have restaging CT scans prior to his next visit and these will be reviewed with him at that time.

## 2017-06-26 NOTE — Telephone Encounter (Signed)
PA for the tudorza has been denied due to the pt not meeting the requirements per the insurance.  MR please advise what else you would like to do.  Thanks

## 2017-06-27 NOTE — Telephone Encounter (Signed)
ATC, fast busy tone

## 2017-06-27 NOTE — Telephone Encounter (Signed)
It does not appear that pt has tried Spiriva respimat previously.   MR what dose of Spiriva would you like. Thanks.

## 2017-06-27 NOTE — Telephone Encounter (Signed)
Can start with lower dose of respimat  Dr. Brand Males, M.D., [facility].C.P Pulmonary and Critical Care Medicine Staff Physician [place] Pager: [phone], If no answer or between  15:00h - 7:00h: call [phone]  06/27/2017 4:57 PM

## 2017-06-27 NOTE — Telephone Encounter (Signed)
I cannot rememer if he tried and does not like spiriva respimat - that would be an option. INcruse would be other option  Dr. Brand Males, M.D., [facility].C.P Pulmonary and Critical Care Medicine Staff Physician [place] [facility] Pager: [phone], If no answer or between  15:00h - 7:00h: call [phone]  06/27/2017 9:42 AM

## 2017-07-08 ENCOUNTER — Ambulatory Visit: Payer: 59 | Admitting: Internal Medicine

## 2017-07-08 ENCOUNTER — Ambulatory Visit: Payer: 59

## 2017-07-08 ENCOUNTER — Other Ambulatory Visit: Payer: 59

## 2017-07-08 ENCOUNTER — Other Ambulatory Visit: Payer: Self-pay | Admitting: Internal Medicine

## 2017-07-09 ENCOUNTER — Other Ambulatory Visit: Payer: Self-pay | Admitting: Internal Medicine

## 2017-07-09 DIAGNOSIS — C3491 Malignant neoplasm of unspecified part of right bronchus or lung: Secondary | ICD-10-CM

## 2017-07-16 NOTE — Telephone Encounter (Signed)
Left message for pt to call us back. 

## 2017-07-18 ENCOUNTER — Ambulatory Visit (HOSPITAL_COMMUNITY): Payer: 59

## 2017-07-19 ENCOUNTER — Ambulatory Visit (HOSPITAL_COMMUNITY)
Admission: RE | Admit: 2017-07-19 | Discharge: 2017-07-19 | Disposition: A | Discharge status: Home / Self Care | Payer: 59 | Source: Ambulatory Visit | Attending: Oncology | Admitting: Oncology

## 2017-07-19 DIAGNOSIS — C3491 Malignant neoplasm of unspecified part of right bronchus or lung: Secondary | ICD-10-CM | POA: Diagnosis present

## 2017-07-19 DIAGNOSIS — I700 Atherosclerosis of aorta: Secondary | ICD-10-CM | POA: Diagnosis not present

## 2017-07-19 DIAGNOSIS — Z5112 Encounter for antineoplastic immunotherapy: Secondary | ICD-10-CM | POA: Insufficient documentation

## 2017-07-19 DIAGNOSIS — C7951 Secondary malignant neoplasm of bone: Secondary | ICD-10-CM | POA: Insufficient documentation

## 2017-07-19 MED ORDER — IOPAMIDOL (ISOVUE-300) INJECTION 61%
100.0000 mL | Freq: Once | INTRAVENOUS | Status: AC | PRN
  Administered 2017-07-19: 100 mL via INTRAVENOUS

## 2017-07-19 MED ORDER — IOPAMIDOL (ISOVUE-300) INJECTION 61%
INTRAVENOUS | Status: AC
  Filled 2017-07-19: qty 100

## 2017-07-22 ENCOUNTER — Encounter: Payer: Self-pay | Admitting: Oncology

## 2017-07-22 ENCOUNTER — Other Ambulatory Visit (HOSPITAL_BASED_OUTPATIENT_CLINIC_OR_DEPARTMENT_OTHER): Payer: 59

## 2017-07-22 ENCOUNTER — Ambulatory Visit (HOSPITAL_BASED_OUTPATIENT_CLINIC_OR_DEPARTMENT_OTHER): Payer: 59

## 2017-07-22 ENCOUNTER — Ambulatory Visit (HOSPITAL_BASED_OUTPATIENT_CLINIC_OR_DEPARTMENT_OTHER): Payer: 59 | Admitting: Oncology

## 2017-07-22 VITALS — BP 170/85 | HR 86 | Temp 97.6°F | Resp 18 | Ht 70.0 in | Wt 114.5 lb

## 2017-07-22 DIAGNOSIS — M545 Low back pain: Secondary | ICD-10-CM

## 2017-07-22 DIAGNOSIS — R05 Cough: Secondary | ICD-10-CM

## 2017-07-22 DIAGNOSIS — R0602 Shortness of breath: Secondary | ICD-10-CM | POA: Diagnosis not present

## 2017-07-22 DIAGNOSIS — Z5112 Encounter for antineoplastic immunotherapy: Secondary | ICD-10-CM

## 2017-07-22 DIAGNOSIS — C3491 Malignant neoplasm of unspecified part of right bronchus or lung: Secondary | ICD-10-CM

## 2017-07-22 DIAGNOSIS — C3411 Malignant neoplasm of upper lobe, right bronchus or lung: Secondary | ICD-10-CM

## 2017-07-22 DIAGNOSIS — I10 Essential (primary) hypertension: Secondary | ICD-10-CM | POA: Diagnosis not present

## 2017-07-22 DIAGNOSIS — R062 Wheezing: Secondary | ICD-10-CM

## 2017-07-22 DIAGNOSIS — Z79899 Other long term (current) drug therapy: Secondary | ICD-10-CM

## 2017-07-22 LAB — CBC WITH DIFFERENTIAL/PLATELET
BASO%: 0.4 % (ref 0.0–2.0)
Basophils Absolute: 0 10*3/uL (ref 0.0–0.1)
EOS%: 2.2 % (ref 0.0–7.0)
Eosinophils Absolute: 0.2 10*3/uL (ref 0.0–0.5)
HCT: 43.4 % (ref 38.4–49.9)
HGB: 14.4 g/dL (ref 13.0–17.1)
LYMPH%: 11.5 % — AB (ref 14.0–49.0)
MCH: 30.1 pg (ref 27.2–33.4)
MCHC: 33.2 g/dL (ref 32.0–36.0)
MCV: 90.8 fL (ref 79.3–98.0)
MONO#: 0.6 10*3/uL (ref 0.1–0.9)
MONO%: 8 % (ref 0.0–14.0)
NEUT%: 77.9 % — ABNORMAL HIGH (ref 39.0–75.0)
NEUTROS ABS: 5.9 10*3/uL (ref 1.5–6.5)
Platelets: 216 10*3/uL (ref 140–400)
RBC: 4.78 10*6/uL (ref 4.20–5.82)
RDW: 14.3 % (ref 11.0–14.6)
WBC: 7.6 10*3/uL (ref 4.0–10.3)
lymph#: 0.9 10*3/uL (ref 0.9–3.3)

## 2017-07-22 LAB — COMPREHENSIVE METABOLIC PANEL
ALT: 20 U/L (ref 0–55)
AST: 21 U/L (ref 5–34)
Albumin: 4 g/dL (ref 3.5–5.0)
Alkaline Phosphatase: 117 U/L (ref 40–150)
Anion Gap: 9 mEq/L (ref 3–11)
BILIRUBIN TOTAL: 0.31 mg/dL (ref 0.20–1.20)
BUN: 27.4 mg/dL — ABNORMAL HIGH (ref 7.0–26.0)
CHLORIDE: 104 meq/L (ref 98–109)
CO2: 25 meq/L (ref 22–29)
CREATININE: 0.9 mg/dL (ref 0.7–1.3)
Calcium: 10.1 mg/dL (ref 8.4–10.4)
EGFR: 78 mL/min/{1.73_m2} — AB (ref >90)
GLUCOSE: 105 mg/dL (ref 70–140)
Potassium: 3.9 mEq/L (ref 3.5–5.1)
SODIUM: 138 meq/L (ref 136–145)
TOTAL PROTEIN: 8 g/dL (ref 6.4–8.3)

## 2017-07-22 LAB — TSH: TSH: 0.703 m(IU)/L (ref 0.320–4.118)

## 2017-07-22 MED ORDER — CYCLOBENZAPRINE HCL 5 MG PO TABS: 5.0000 mg | ORAL_TABLET | Freq: Every evening | ORAL | 0 refills | Status: DC | PRN

## 2017-07-22 MED ORDER — SODIUM CHLORIDE 0.9 % IV SOLN
480.0000 mg | Freq: Once | INTRAVENOUS | Status: AC
  Administered 2017-07-22: 480 mg via INTRAVENOUS
  Filled 2017-07-22: qty 48

## 2017-07-22 MED ORDER — SODIUM CHLORIDE 0.9 % IV SOLN
Freq: Once | INTRAVENOUS | Status: AC
  Administered 2017-07-22: 17:00:00 via INTRAVENOUS

## 2017-07-22 NOTE — Assessment & Plan Note (Signed)
This is a very pleasant 76 year old white male with metastatic non-small cell lung cancer, adenocarcinoma status post short course of concurrent chemoradiation with weekly carboplatin and paclitaxel followed by 1 cycle of consolidation chemotherapy discontinued secondary to intolerance. He is currently undergoing treatment with immunotherapy with Nivolumab status post 36cycles. He is tolerating the treatment well.  Patient was seen with Dr. Julien Nordmann. CT scan results were discussed with the patient and his family. Scans show stable disease and we recommend that he continue with Nivolumab every 4 weeks. He will proceed today with treatment as scheduled.  The patient has inquired about taking Flexeril for his mid back pain. 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 The patient states that the pain is not really that bad. He was given a prescription for Flexeril 5 mg tablets to be used at bedtime as needed. He will let us know if this pain worsens.  Patient will return in 4 weeks for evaluation prior to his next dose of Nivolumab.

## 2017-07-22 NOTE — Patient Instructions (Signed)
[facility] Discharge Instructions for Patients Receiving Chemotherapy  Today you received the following chemotherapy agents:  Nivolumab.  To help prevent nausea and vomiting after your treatment, we encourage you to take your nausea medication as directed.   If you develop nausea and vomiting that is not controlled by your nausea medication, call the clinic.   BELOW ARE SYMPTOMS THAT SHOULD BE REPORTED IMMEDIATELY:  *FEVER GREATER THAN 100.5 F  *CHILLS WITH OR WITHOUT FEVER  NAUSEA AND VOMITING THAT IS NOT CONTROLLED WITH YOUR NAUSEA MEDICATION  *UNUSUAL SHORTNESS OF BREATH  *UNUSUAL BRUISING OR BLEEDING  TENDERNESS IN MOUTH AND THROAT WITH OR WITHOUT PRESENCE OF ULCERS  *URINARY PROBLEMS  *BOWEL PROBLEMS  UNUSUAL RASH Items with * indicate a potential emergency and should be followed up as soon as possible.  Feel free to call the clinic you have any questions or concerns. The clinic phone number is [phone].  Please show the CHEMO ALERT CARD at check-in to the Emergency Department and triage nurse.   

## 2017-07-22 NOTE — Progress Notes (Signed)
[facility] Follow up:    Aura Dials, MD [phone] [address], [address] [place] [address]   DIAGNOSIS: Metastatic non-small cell lung cancer, adenocarcinoma initially diagnosed as unresectable stage IIIa (T2a, N2, M0) in July 2016.  [facility] molecular studies reveal that he was positive for [place], [place], and STAG2 [id number]. He was negative for: EGFR, ALK, BRAF, MET, RET, ERBB2 and ROS1  SUMMARY OF ONCOLOGIC HISTORY: Oncology History   Patient presented to ED with SOB and right sided CP.  Work up showed hydropneumothorax.   Non-small cell carcinoma of right lung, stage 3   Staging form: Lung, AJCC 7th Edition     Clinical stage from 04/21/2015: Stage IIIA (T2a, N2, M0) - Signed by MCurt Bears MD on 04/21/2015       Non-small cell carcinoma of right lung, stage 3 ([facility]   04/06/2015 Imaging    CT Chest IMPRESSION: Tiny less than 5% right pneumothorax with right chest tube in place. 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  4.7 cm spiculated masslike opacity in central right low      04/12/2015 Imaging    PET scan  4.0 x 3.0 cm hypermetabolic right perihilar mass-like opacity, suspicious for primary bronchogenic neoplasm, as described above. Consider bronchoscopy for further evaluation.  Multiple nodular/cavitary opacities in the left lung      04/18/2015 Pathology Results    1. Lung, biopsy, right middle lobe - NON-SMALL CELL CARCINOMA, CONSISTENT WITH ADENOCARCINOMA. - SEE COMMENT. 2. Lung, biopsy, right lower lobe - NON-SMALL CELL CARCINOMA, CONSISTENT WITH ADENOCARCINOMA      04/18/2015 Surgery    PROCEDURES PERFORMED: Bronchoscopy with biopsy of right lower and right middle lobe bronchus and EBUS with transbronchial biopsy of 10R lymph nodes.      04/21/2015 Initial Diagnosis    Non-small cell carcinoma of right lung, stage 3      PRIOR THERAPY: 1)  Concurrent chemoradiation with chemotherapy in the form of weekly carboplatin for AUC 2 paclitaxel 45 mg/m given concurrent with radiation therapy. First cycle started 05/09/2015. Status post 7 cycles. 2) Consolidation chemotherapy with carboplatin for AUC of 5 on day 1 and gemcitabine 1000 MG/M2 on days 1 and 8 every 3 weeks. First dose 08/15/2015. Discontinued secondary to intolerance.   CURRENT THERAPY: Immunotherapy with Nivolumab 240 mg IV every 2 weeks, status post 35cycles. Nivolumab was switched to 480 mg IV every 4 weeks on 06/24/2017. Status post one cycle of every 4 week dosing.  INTERVAL HISTORY: BEIVIN MASCIO89y.o. male returns for routine follow-up with his daughter and son. Nivolumab was switched to every 4 week dosing at his last visit. The patient tolerated it well and had no change in his side effects. The patient is feeling well today with no specific complaints. He continues to tolerate his treatment with Nivolumab fairly well. He denies having any chest pain, shortness of breath at rest. He does report dyspnea on exertion as well as a cough and wheezing. Denies hemoptysis. He denies any fevers and chills. No significant weight loss or night sweats. Denies nausea, vomiting, diarrhea, constipation. He notices some mild increase in pain in his right mid back and is asking if he can use Flexeril. The patient is here for evaluation prior to Nivolumab.   Patient Active Problem List   Diagnosis Date Noted  . Goals of care, counseling/discussion 06/24/2017  . Malignant neoplasm of upper lobe  of right lung ([place]) 05/27/2017  . H/O vitamin D deficiency 05/22/2017  . Weight loss 04/01/2017  . Protein-calorie malnutrition ([place]) 11/26/2016  . Acute bronchitis 11/12/2016  . Hypertension 10/30/2016  . Smoking 1/2 pack a day or less 09/17/2016  . Neoplastic malignant related fatigue 05/15/2016  . Stage 2 moderate COPD by GOLD classification ([place]) 02/14/2016  . Hoarseness of voice  02/14/2016  . Encounter for antineoplastic immunotherapy 02/01/2016  . Pressure ulcer 09/14/2015  . Palliative care encounter 09/07/2015  . Other fatigue   . Right-sided chest pain 08/31/2015  . COPD exacerbation ([place]) 08/04/2015  . Pulmonary emphysema ([place]) 07/28/2015  . Malignant neoplasm of lung ([place]) 07/28/2015  . Encounter for antineoplastic chemotherapy 05/09/2015  . Non-small cell carcinoma of right lung, stage 3 ([place]) 04/21/2015  . Hemoptysis, unspecified 10/07/2013    is allergic to aleve [naproxen].  MEDICAL HISTORY: Past Medical History:  Diagnosis Date  . Cancer ([place])    skin cancer  . Constipation    AFTER ANESTHESIA  . Difficulty sleeping   . History of nonmelanoma skin cancer   . Hypertension 10/30/2016  . Inguinal hernia   . Lung mass    right / HAS HAD RADIATION AND CHEMO FOR LUNG CANCER  . Pneumothorax on right 08/24/2015  . Productive cough    "DUE TO RADIATION"  . Radiation 05/11/15-06/21/15   NSCLCA/right lung  . Smoking 1/2 pack a day or less 09/17/2016  . Spontaneous pneumothorax MANY YRS AGO AND AGAIN 04/04/15   HISTORY OF (ONLY)  . Weight loss 04/01/2017    SURGICAL HISTORY: Past Surgical History:  Procedure Laterality Date  . CHEST TUBE INSERTION Right 08/02/2015   Procedure: INSERTION OF SECOND RIGHT CHEST TUBE WITH FLUROSCOPY;  Surgeon: Grace Isaac, MD;  Location: [facility];  Service: Thoracic;  Laterality: Right;  . CHEST TUBE INSERTION  07/28/2015  . COLONOSCOPY W/ BIOPSIES AND POLYPECTOMY    . HERNIA REPAIR  2011  . INGUINAL HERNIA REPAIR Left 07/15/2015   Procedure: OPEN LEFT INGUINAL HERNIA REPAIR;  Surgeon: Johnathan Hausen, MD;  Location: [facility];  Service: General;  Laterality: Left;  With MESH  . TONSILLECTOMY    . VIDEO BRONCHOSCOPY WITH ENDOBRONCHIAL ULTRASOUND N/A 04/18/2015   Procedure: VIDEO BRONCHOSCOPY WITH ENDOBRONCHIAL ULTRASOUND;  Surgeon: Grace Isaac, MD;  Location: [facility];  Service: Thoracic;  Laterality: N/A;  . VIDEO  BRONCHOSCOPY WITH INSERTION OF INTERBRONCHIAL VALVE (IBV) N/A 08/02/2015   Procedure: VIDEO BRONCHOSCOPY WITH ATTEMPTED INSERTION OF INTERBRONCHIAL VALVE (IBV) WITH FLUROSCOPY;  Surgeon: Grace Isaac, MD;  Location: [facility];  Service: Thoracic;  Laterality: N/A;  . WISDOM TOOTH EXTRACTION      SOCIAL HISTORY: Social History   Social History  . Marital status: Divorced    Spouse name: N/A  . Number of children: N/A  . Years of education: N/A   Occupational History  . engineer [employer]   Social History Main Topics  . Smoking status: Current Every Day Smoker    Packs/day: 0.25    Years: 55.00    Types: Cigarettes  . Smokeless tobacco: Never Used     Comment: 1/2 ppd 7.25.18 ee  . Alcohol use 0.0 oz/week     Comment: every other night -social  . Drug use: No  . Sexual activity: Not Currently   Other Topics Concern  . Not on file   Social History Narrative  . No narrative on file    FAMILY HISTORY: Family History  Problem  Relation Age of Onset  . Heart attack Father   . Heart disease Father   . Hypertension Mother     Review of Systems  Constitutional: Negative.   HENT:  Negative.   Eyes: Negative.   Respiratory: Positive for cough. Negative for hemoptysis and shortness of breath.   Cardiovascular: Negative.   Gastrointestinal: Negative.   Genitourinary: Negative.    Musculoskeletal:       Right mid back pain.  Skin: Negative.   Neurological: Negative.   Hematological: Negative.   Psychiatric/Behavioral: Negative.       PHYSICAL EXAMINATION  ECOG PERFORMANCE STATUS: 1 - Symptomatic but completely ambulatory  Vitals:   07/22/17 1451  BP: (!) 170/85  Pulse: 86  Resp: 18  Temp: 97.6 F (36.4 C)  SpO2: 98%    Physical Exam  Constitutional: He is oriented to person, place, and time and well-developed, well-nourished, and in no distress. No distress.  HENT:  Head: Normocephalic.  Mouth/Throat: Oropharynx is clear and moist. No  oropharyngeal exudate.  Eyes: Conjunctivae are normal. Right eye exhibits no discharge. Left eye exhibits no discharge. No scleral icterus.  Neck: Normal range of motion. Neck supple.  Cardiovascular: Normal rate, regular rhythm, normal heart sounds and intact distal pulses.   Pulmonary/Chest: Effort normal and breath sounds normal. No respiratory distress. He has no wheezes. He has no rales.  Abdominal: Soft. Bowel sounds are normal. He exhibits no distension and no mass. There is no tenderness.  Musculoskeletal: Normal range of motion. He exhibits no edema.  Lymphadenopathy:    He has no cervical adenopathy.  Neurological: He is alert and oriented to person, place, and time. He exhibits normal muscle tone. Coordination normal.  Skin: Skin is warm and dry. No rash noted. He is not diaphoretic. No erythema. No pallor.  Psychiatric: Mood, memory, affect and judgment normal.  Vitals reviewed.   LABORATORY DATA:  CBC    Component Value Date/Time   WBC 7.6 07/22/2017 1431   WBC 11.3 (H) 09/12/2015 0410   RBC 4.78 07/22/2017 1431   RBC 3.09 (L) 09/12/2015 0410   HGB 14.4 07/22/2017 1431   HCT 43.4 07/22/2017 1431   PLT 216 07/22/2017 1431   MCV 90.8 07/22/2017 1431   MCH 30.1 07/22/2017 1431   MCH 28.8 09/12/2015 0410   MCHC 33.2 07/22/2017 1431   MCHC 32.0 09/12/2015 0410   RDW 14.3 07/22/2017 1431   LYMPHSABS 0.9 07/22/2017 1431   MONOABS 0.6 07/22/2017 1431   EOSABS 0.2 07/22/2017 1431   BASOSABS 0.0 07/22/2017 1431    CMP     Component Value Date/Time   NA 138 07/22/2017 1431   K 3.9 07/22/2017 1431   CL 102 09/12/2015 0410   CO2 25 07/22/2017 1431   GLUCOSE 105 07/22/2017 1431   BUN 27.4 (H) 07/22/2017 1431   CREATININE 0.9 07/22/2017 1431   CALCIUM 10.1 07/22/2017 1431   PROT 8.0 07/22/2017 1431   ALBUMIN 4.0 07/22/2017 1431   AST 21 07/22/2017 1431   ALT 20 07/22/2017 1431   ALKPHOS 117 07/22/2017 1431   BILITOT 0.31 07/22/2017 1431   GFRNONAA >60 09/13/2015  0457   GFRAA >60 09/13/2015 0457    RADIOGRAPHIC STUDIES:  Ct Chest W Contrast  Result Date: 07/22/2017 CLINICAL DATA:  76 year old with non-small cell lung cancer of the right lung. Stage III. EXAM: CT CHEST, ABDOMEN, AND PELVIS WITH CONTRAST TECHNIQUE: Multidetector CT imaging of the chest, abdomen and pelvis was performed following the standard protocol during  bolus administration of intravenous contrast. CONTRAST:  135m ISOVUE-300 IOPAMIDOL (ISOVUE-300) INJECTION 61% COMPARISON:  05/23/2017 FINDINGS: CT CHEST FINDINGS Cardiovascular: The heart size is normal. No pericardial effusion identified. Aortic atherosclerosis. Calcification in the LAD coronary artery identified. Mediastinum/Nodes: The trachea appears patent. No focal esophageal abnormality identified. Index right paratracheal lymph node measures 1.1 cm, image 16 of series 2. Right posterior hilar increase soft tissue measures 1.9 x 2.7 cm, image 25 of series 2. Previously 1.8 x 2.8 cm. Lungs/Pleura: Masslike architectural distortion, fibrosis and bronchiectasis within the right upper lung zone is again noted and appears similar to previous exam. No pleural effusion. Bilateral pulmonary nodules are again noted compatible with metastatic disease. Index nodule within the superior segment of right lower lobe measures 8 mm, image 88 of series 6. The previously 9 mm. Adjacent nodule measures 8 mm, image 90 of series 6. Previously 7 mm. Also in the right lower lobe is an index nodule measuring 8 mm, image 94 of series 6. Unchanged from previous exam. Anterior right midlung nodule measures 1.3 cm, image 97 of series 6. Previously 1.2 cm. Index nodule within the subpleural anterior left upper lobe measures 1.3 cm, image 10 of series 6. Previously 1.4 cm. Posterior left upper lobe index nodule measures 1 cm, image 29 of series 6. Previously 1.1 cm. The solid nodule within the superior segment of the left lower lobe measures 8 mm, image 46 of series 6.  Previously 7 mm. There is a non solid nodule within the left lower lobe measuring 1.7 x 1.7 cm, image 117 of series 6. Previously 2.3 x 1.6 cm. There is a subpleural, paraspinal lesion at the T11 level on the right which measures 2.5 x 3.2 cm. Unchanged from previous exam with associated mixed lytic and sclerotic changes on the adjacent vertebral body. Musculoskeletal: T8 compression deformity is similar to the previous exam. There is a mixed lytic and sclerotic lesion at the T11 more conspicuous on today's study. CT ABDOMEN PELVIS FINDINGS Hepatobiliary: No focal liver abnormality is seen. No gallstones, gallbladder wall thickening, or biliary dilatation. Pancreas: Unremarkable. No pancreatic ductal dilatation or surrounding inflammatory changes. Spleen: Normal in size without focal abnormality. Adrenals/Urinary Tract: Stable appearance of right adrenal adenoma normal appearance of the left adrenal gland. Kidneys are unremarkable. Urinary bladder appears normal. Stomach/Bowel: Stomach is unremarkable. The small bowel loops have a normal course and caliber without obstruction. Numerous colonic diverticula noted without acute inflammation. Vascular/Lymphatic: Aortic atherosclerosis noted. No aneurysm. No adenopathy identified within the upper abdomen. No inguinal adenopathy. Reproductive: Prostate gland appears enlarged. Other: No ascites or focal fluid collections. 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 Left sacral wing metastasis appears sclerotic measuring 1.6 cm, image 86 of series 2. Stable from previous exam. IMPRESSION: 1. Similar appearance of pulmonary nodularity bilaterally. 2. Paraspinal soft tissue at the T11 level with associated mixed lytic and sclerotic lesion involving the T11 vertebra is unchanged from previous exam. 3. Stable pelvic scratch set stable sclerotic metastasis involving the bony pelvis. 4. Similar appearance of post treatment  changes involving the right upper lobe. 5.  Aortic Atherosclerosis (ICD10-I70.0). Electronically Signed   By: TKerby MoorsM.D.   On: 07/22/2017 08:47   Ct Abdomen Pelvis W Contrast  Result Date: 07/22/2017 CLINICAL DATA:  76year old with non-small cell lung cancer of the right lung. Stage III. EXAM: CT CHEST, ABDOMEN, AND PELVIS WITH CONTRAST TECHNIQUE: Multidetector CT imaging of the chest, abdomen and  pelvis was performed following the standard protocol during bolus administration of intravenous contrast. CONTRAST:  166m ISOVUE-300 IOPAMIDOL (ISOVUE-300) INJECTION 61% COMPARISON:  05/23/2017 FINDINGS: CT CHEST FINDINGS Cardiovascular: The heart size is normal. No pericardial effusion identified. Aortic atherosclerosis. Calcification in the LAD coronary artery identified. Mediastinum/Nodes: The trachea appears patent. No focal esophageal abnormality identified. Index right paratracheal lymph node measures 1.1 cm, image 16 of series 2. Right posterior hilar increase soft tissue measures 1.9 x 2.7 cm, image 25 of series 2. Previously 1.8 x 2.8 cm. Lungs/Pleura: Masslike architectural distortion, fibrosis and bronchiectasis within the right upper lung zone is again noted and appears similar to previous exam. No pleural effusion. Bilateral pulmonary nodules are again noted compatible with metastatic disease. Index nodule within the superior segment of right lower lobe measures 8 mm, image 88 of series 6. The previously 9 mm. Adjacent nodule measures 8 mm, image 90 of series 6. Previously 7 mm. Also in the right lower lobe is an index nodule measuring 8 mm, image 94 of series 6. Unchanged from previous exam. Anterior right midlung nodule measures 1.3 cm, image 97 of series 6. Previously 1.2 cm. Index nodule within the subpleural anterior left upper lobe measures 1.3 cm, image 10 of series 6. Previously 1.4 cm. Posterior left upper lobe index nodule measures 1 cm, image 29 of series 6. Previously 1.1 cm. The  solid nodule within the superior segment of the left lower lobe measures 8 mm, image 46 of series 6. Previously 7 mm. There is a non solid nodule within the left lower lobe measuring 1.7 x 1.7 cm, image 117 of series 6. Previously 2.3 x 1.6 cm. There is a subpleural, paraspinal lesion at the T11 level on the right which measures 2.5 x 3.2 cm. Unchanged from previous exam with associated mixed lytic and sclerotic changes on the adjacent vertebral body. Musculoskeletal: T8 compression deformity is similar to the previous exam. There is a mixed lytic and sclerotic lesion at the T11 more conspicuous on today's study. CT ABDOMEN PELVIS FINDINGS Hepatobiliary: No focal liver abnormality is seen. No gallstones, gallbladder wall thickening, or biliary dilatation. Pancreas: Unremarkable. No pancreatic ductal dilatation or surrounding inflammatory changes. Spleen: Normal in size without focal abnormality. Adrenals/Urinary Tract: Stable appearance of right adrenal adenoma normal appearance of the left adrenal gland. Kidneys are unremarkable. Urinary bladder appears normal. Stomach/Bowel: Stomach is unremarkable. The small bowel loops have a normal course and caliber without obstruction. Numerous colonic diverticula noted without acute inflammation. Vascular/Lymphatic: Aortic atherosclerosis noted. No aneurysm. No adenopathy identified within the upper abdomen. No inguinal adenopathy. Reproductive: Prostate gland appears enlarged. Other: No ascites or focal fluid collections. 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 Left sacral wing metastasis appears sclerotic measuring 1.6 cm, image 86 of series 2. Stable from previous exam. IMPRESSION: 1. Similar appearance of pulmonary nodularity bilaterally. 2. Paraspinal soft tissue at the T11 level with associated mixed lytic and sclerotic lesion involving the T11 vertebra is unchanged from previous exam. 3. Stable pelvic  scratch set stable sclerotic metastasis involving the bony pelvis. 4. Similar appearance of post treatment changes involving the right upper lobe. 5.  Aortic Atherosclerosis (ICD10-I70.0). Electronically Signed   By: TKerby MoorsM.D.   On: 07/22/2017 08:47   ASSESSMENT and THERAPY PLAN:   Malignant neoplasm of lung ([facility] This is a very pleasant 76year old white male with metastatic non-small cell lung cancer, adenocarcinoma status post short course of  concurrent chemoradiation with weekly carboplatin and paclitaxel followed by 1 cycle of consolidation chemotherapy discontinued secondary to intolerance. He is currently undergoing treatment with immunotherapy with Nivolumab status post 36cycles. He is tolerating the treatment well.  Patient was seen with Dr. Julien Nordmann. CT scan results were discussed with the patient and his family. Scans show stable disease and we recommend that he continue with Nivolumab every 4 weeks. He will proceed today with treatment as scheduled.  The patient has inquired about taking Flexeril for his mid back pain. 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 The patient states that the pain is not really that bad. He was given a prescription for Flexeril 5 mg tablets to be used at bedtime as needed. He will let us know if this pain worsens.  Patient will return in 4 weeks for evaluation prior to his next dose of Nivolumab.    No orders of the defined types were placed in this encounter.   All questions were answered. The patient knows to call the clinic with any problems, questions or concerns. We can certainly see the patient much sooner if necessary.  Mikey Bussing, NP 07/22/2017   ADDENDUM: Hematology/Oncology Attending: I had a face to face encounter with the patient today. I recommended his care plan. This is a  very pleasant 76 years old white male with metastatic non-small cell lung cancer, adenocarcinoma and he is currently undergoing treatment with immunotherapy with Nivolumab currently on the higher dose 480 MG IV every 4 weeks. He was awaiting this treatment fairly well with no significant adverse effects. He had repeat CT scan of the chest, abdomen and pelvis performed recently. I personally and independently reviewed the scans and discuss the results with the patient and his family. His scan showed no evidence for disease progression. I recommended for the patient to continue his current treatment with Nivolumab 480 MG IV every 4 weeks and he will start cycle #2 today. For hypertension, he will continue to monitor his blood pressure closely and take his blood pressure medication as prescribed. For the intermittent back pain, she was given prescription for Flexeril which she did help him in the past. He would come back for follow-up visit in 4 weeks with the next cycle of his treatment. The patient was advised to call immediately if he has any concerning symptoms in the interval.  Disclaimer: This note was dictated with voice recognition software. Similar sounding words can inadvertently be transcribed and may be missed upon review. Eilleen Kempf, MD 07/22/17

## 2017-07-23 ENCOUNTER — Telehealth: Payer: Self-pay | Admitting: Oncology

## 2017-07-23 NOTE — Telephone Encounter (Signed)
lmtcb for pt.  

## 2017-07-23 NOTE — Telephone Encounter (Signed)
Scheduled appt per 9/24 los - patient to get new scheduled next scheduled visit.

## 2017-07-25 NOTE — Telephone Encounter (Signed)
Called and spoke to pt who states he is still taking the [place] and it is being covered by his insurance company. Told pt to call us if he needed anything and pt expressed understanding. Nothing further needed.

## 2017-08-19 ENCOUNTER — Ambulatory Visit (HOSPITAL_BASED_OUTPATIENT_CLINIC_OR_DEPARTMENT_OTHER): Payer: 59

## 2017-08-19 ENCOUNTER — Other Ambulatory Visit (HOSPITAL_BASED_OUTPATIENT_CLINIC_OR_DEPARTMENT_OTHER): Payer: 59

## 2017-08-19 ENCOUNTER — Telehealth: Payer: Self-pay | Admitting: Internal Medicine

## 2017-08-19 ENCOUNTER — Encounter: Payer: Self-pay | Admitting: Oncology

## 2017-08-19 ENCOUNTER — Ambulatory Visit (HOSPITAL_BASED_OUTPATIENT_CLINIC_OR_DEPARTMENT_OTHER): Payer: 59 | Admitting: Oncology

## 2017-08-19 VITALS — BP 160/74 | HR 94 | Temp 97.9°F | Resp 18 | Ht 70.0 in | Wt 114.5 lb

## 2017-08-19 DIAGNOSIS — R0609 Other forms of dyspnea: Secondary | ICD-10-CM | POA: Diagnosis not present

## 2017-08-19 DIAGNOSIS — M545 Low back pain: Secondary | ICD-10-CM

## 2017-08-19 DIAGNOSIS — R05 Cough: Secondary | ICD-10-CM | POA: Diagnosis not present

## 2017-08-19 DIAGNOSIS — Z79899 Other long term (current) drug therapy: Secondary | ICD-10-CM

## 2017-08-19 DIAGNOSIS — Z5112 Encounter for antineoplastic immunotherapy: Secondary | ICD-10-CM

## 2017-08-19 DIAGNOSIS — C3411 Malignant neoplasm of upper lobe, right bronchus or lung: Secondary | ICD-10-CM

## 2017-08-19 DIAGNOSIS — R062 Wheezing: Secondary | ICD-10-CM

## 2017-08-19 DIAGNOSIS — C3491 Malignant neoplasm of unspecified part of right bronchus or lung: Secondary | ICD-10-CM

## 2017-08-19 DIAGNOSIS — Z720 Tobacco use: Secondary | ICD-10-CM

## 2017-08-19 DIAGNOSIS — Z23 Encounter for immunization: Secondary | ICD-10-CM

## 2017-08-19 LAB — CBC WITH DIFFERENTIAL/PLATELET
BASO%: 0.8 % (ref 0.0–2.0)
Basophils Absolute: 0.1 10*3/uL (ref 0.0–0.1)
EOS%: 4.3 % (ref 0.0–7.0)
Eosinophils Absolute: 0.4 10*3/uL (ref 0.0–0.5)
HCT: 41.9 % (ref 38.4–49.9)
HGB: 14 g/dL (ref 13.0–17.1)
LYMPH%: 11.5 % — AB (ref 14.0–49.0)
MCH: 29.8 pg (ref 27.2–33.4)
MCHC: 33.3 g/dL (ref 32.0–36.0)
MCV: 89.5 fL (ref 79.3–98.0)
MONO#: 0.5 10*3/uL (ref 0.1–0.9)
MONO%: 6.3 % (ref 0.0–14.0)
NEUT%: 77.1 % — AB (ref 39.0–75.0)
NEUTROS ABS: 6.3 10*3/uL (ref 1.5–6.5)
PLATELETS: 214 10*3/uL (ref 140–400)
RBC: 4.68 10*6/uL (ref 4.20–5.82)
RDW: 14.4 % (ref 11.0–14.6)
WBC: 8.2 10*3/uL (ref 4.0–10.3)
lymph#: 0.9 10*3/uL (ref 0.9–3.3)

## 2017-08-19 LAB — COMPREHENSIVE METABOLIC PANEL
ALBUMIN: 3.9 g/dL (ref 3.5–5.0)
ALK PHOS: 99 U/L (ref 40–150)
ALT: 19 U/L (ref 0–55)
AST: 19 U/L (ref 5–34)
Anion Gap: 11 mEq/L (ref 3–11)
BUN: 22.9 mg/dL (ref 7.0–26.0)
CALCIUM: 9.7 mg/dL (ref 8.4–10.4)
CHLORIDE: 105 meq/L (ref 98–109)
CO2: 23 mEq/L (ref 22–29)
CREATININE: 0.9 mg/dL (ref 0.7–1.3)
EGFR: >60 mL/min/{1.73_m2} (ref >60)
GLUCOSE: 97 mg/dL (ref 70–140)
Potassium: 4.1 mEq/L (ref 3.5–5.1)
SODIUM: 138 meq/L (ref 136–145)
Total Bilirubin: 0.29 mg/dL (ref 0.20–1.20)
Total Protein: 7.6 g/dL (ref 6.4–8.3)

## 2017-08-19 LAB — TSH: TSH: 0.794 m[IU]/L (ref 0.320–4.118)

## 2017-08-19 MED ORDER — SODIUM CHLORIDE 0.9 % IV SOLN
480.0000 mg | Freq: Once | INTRAVENOUS | Status: AC
  Administered 2017-08-19: 480 mg via INTRAVENOUS
  Filled 2017-08-19: qty 48

## 2017-08-19 MED ORDER — SODIUM CHLORIDE 0.9 % IV SOLN
Freq: Once | INTRAVENOUS | Status: AC
  Administered 2017-08-19: 16:00:00 via INTRAVENOUS

## 2017-08-19 MED ORDER — INFLUENZA VAC SPLIT HIGH-DOSE 0.5 ML IM SUSY
0.5000 mL | PREFILLED_SYRINGE | INTRAMUSCULAR | Status: AC
  Administered 2017-08-19: 0.5 mL via INTRAMUSCULAR

## 2017-08-19 NOTE — Progress Notes (Signed)
[facility] up:    Gabriel Dials, MD [place] [address]   DIAGNOSIS: Metastatic non-small cell lung cancer, adenocarcinoma initially diagnosed as unresectable stage IIIa (T2a, N2, M0) in July 2016.  Foundation One molecular studies reveal that he was positive for [place], [place], and STAG2 S1064f*20. He was negative for: EGFR, ALK, BRAF, MET, RET, ERBB2 and ROS1  SUMMARY OF ONCOLOGIC HISTORY: Oncology History   Patient presented to ED with SOB and right sided CP.  Work up showed hydropneumothorax.   Non-small cell carcinoma of right lung, stage 3   Staging form: Lung, AJCC 7th Edition     Clinical stage from 04/21/2015: Stage IIIA (T2a, N2, M0) - Signed by MCurt Bears MD on 04/21/2015       Non-small cell carcinoma of right lung, stage 3 ([place]   04/06/2015 Imaging    CT Chest IMPRESSION: Tiny less than 5% right pneumothorax with right chest tube in place. 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  4.7 cm spiculated masslike opacity in central right low      04/12/2015 Imaging    PET scan  4.0 x 3.0 cm hypermetabolic right perihilar mass-like opacity, suspicious for primary bronchogenic neoplasm, as described above. Consider bronchoscopy for further evaluation.  Multiple nodular/cavitary opacities in the left lung      04/18/2015 Pathology Results    1. Lung, biopsy, right middle lobe - NON-SMALL CELL CARCINOMA, CONSISTENT WITH ADENOCARCINOMA. - SEE COMMENT. 2. Lung, biopsy, right lower lobe - NON-SMALL CELL CARCINOMA, CONSISTENT WITH ADENOCARCINOMA      04/18/2015 Surgery    PROCEDURES PERFORMED: Bronchoscopy with biopsy of right lower and right middle lobe bronchus and EBUS with transbronchial biopsy of 10R lymph nodes.      04/21/2015 Initial Diagnosis    Non-small cell carcinoma of right lung, stage 3      PRIOR THERAPY: 1) Concurrent  chemoradiation with chemotherapy in the form of weekly carboplatin for AUC 2 paclitaxel 45 mg/m given concurrent with radiation therapy. First cycle started 05/09/2015. Status post 7 cycles. 2) Consolidation chemotherapy with carboplatin for AUC of 5 on day 1 and gemcitabine 1000 MG/M2 on days 1 and 8 every 3 weeks. First dose 08/15/2015. Discontinued secondary to intolerance.  CURRENT THERAPY: Immunotherapy with Nivolumab 240 mg IV every 2 weeks, status post 35cycles. Nivolumab was switched to 480 mg IV every 4 weeks on 06/24/2017. Status post two cycles of every 4 week dosing.  INTERVAL HISTORY: Gabriel [id number].[id number]. male returns for routine follow-up with his daughter. Continues to tolerate every four-week Nivolumab without any changes side effect profile. The patient is feeling well today with no specific complaints. He denies having any chest pain, shortness of breath at rest. He does report dyspnea on exertion as well as a cough and wheezing. Denies hemoptysis. He denies any fevers and chills. No significant weight loss or night sweats. Denies nausea, vomiting, diarrhea, constipation. His right mid back pain has improved. He has not really noticed a difference with Flexeril. The patient is here for evaluation prior to Nivolumab.   Patient Active Problem List   Diagnosis Date Noted  . Goals of care, counseling/discussion 06/24/2017  . Malignant neoplasm of upper lobe of right lung ([place] 05/27/2017  . H/O vitamin D deficiency 05/22/2017  . Weight loss 04/01/2017  . Protein-calorie malnutrition ([place] 11/26/2016  . Acute bronchitis 11/12/2016  .  Hypertension 10/30/2016  . Smoking 1/2 pack a day or less 09/17/2016  . Neoplastic malignant related fatigue 05/15/2016  . Stage 2 moderate COPD by GOLD classification ([place]) 02/14/2016  . Hoarseness of voice 02/14/2016  . Encounter for antineoplastic immunotherapy 02/01/2016  . Pressure ulcer 09/14/2015  . Palliative care encounter 09/07/2015   . Other fatigue   . Right-sided chest pain 08/31/2015  . COPD exacerbation ([place]) 08/04/2015  . Pulmonary emphysema ([place]) 07/28/2015  . Malignant neoplasm of lung ([place]) 07/28/2015  . Encounter for antineoplastic chemotherapy 05/09/2015  . Non-small cell carcinoma of right lung, stage 3 ([place]) 04/21/2015  . Hemoptysis, unspecified 10/07/2013    is allergic to aleve [naproxen].  MEDICAL HISTORY: Past Medical History:  Diagnosis Date  . Cancer ([place])    skin cancer  . Constipation    AFTER ANESTHESIA  . Difficulty sleeping   . History of nonmelanoma skin cancer   . Hypertension 10/30/2016  . Inguinal hernia   . Lung mass    right / HAS HAD RADIATION AND CHEMO FOR LUNG CANCER  . Pneumothorax on right 08/24/2015  . Productive cough    "DUE TO RADIATION"  . Radiation 05/11/15-06/21/15   NSCLCA/right lung  . Smoking 1/2 pack a day or less 09/17/2016  . Spontaneous pneumothorax MANY YRS AGO AND AGAIN 04/04/15   HISTORY OF (ONLY)  . Weight loss 04/01/2017    SURGICAL HISTORY: Past Surgical History:  Procedure Laterality Date  . CHEST TUBE INSERTION Right 08/02/2015   Procedure: INSERTION OF SECOND RIGHT CHEST TUBE WITH FLUROSCOPY;  Surgeon: Grace Isaac, MD;  Location: [place] [place];  Service: Thoracic;  Laterality: Right;  . CHEST TUBE INSERTION  07/28/2015  . COLONOSCOPY W/ BIOPSIES AND POLYPECTOMY    . HERNIA REPAIR  2011  . INGUINAL HERNIA REPAIR Left 07/15/2015   Procedure: OPEN LEFT INGUINAL HERNIA REPAIR;  Surgeon: Johnathan Hausen, MD;  Location: [facility];  Service: General;  Laterality: Left;  With MESH  . TONSILLECTOMY    . VIDEO BRONCHOSCOPY WITH ENDOBRONCHIAL ULTRASOUND N/A 04/18/2015   Procedure: VIDEO BRONCHOSCOPY WITH ENDOBRONCHIAL ULTRASOUND;  Surgeon: Grace Isaac, MD;  Location: [facility];  Service: Thoracic;  Laterality: N/A;  . VIDEO BRONCHOSCOPY WITH INSERTION OF INTERBRONCHIAL VALVE (IBV) N/A 08/02/2015   Procedure: VIDEO BRONCHOSCOPY WITH ATTEMPTED INSERTION OF  INTERBRONCHIAL VALVE (IBV) WITH FLUROSCOPY;  Surgeon: Grace Isaac, MD;  Location: [facility];  Service: Thoracic;  Laterality: N/A;  . WISDOM TOOTH EXTRACTION      SOCIAL HISTORY: Social History   Social History  . Marital status: Divorced    Spouse name: N/A  . Number of children: N/A  . Years of education: N/A   Occupational History  . engineer [employer]   Social History Main Topics  . Smoking status: Current Every Day Smoker    Packs/day: 0.25    Years: 55.00    Types: Cigarettes  . Smokeless tobacco: Never Used     Comment: 1/2 ppd 7.25.18 ee  . Alcohol use 0.0 oz/week     Comment: every other night -social  . Drug use: No  . Sexual activity: Not Currently   Other Topics Concern  . Not on file   Social History Narrative  . No narrative on file    FAMILY HISTORY: Family History  Problem Relation Age of Onset  . Heart attack Father   . Heart disease Father   . Hypertension Mother     Review of Systems  Constitutional: Negative.  HENT:  Negative.   Eyes: Negative.   Respiratory: Positive for cough and wheezing. Negative for hemoptysis.        Dyspnea on exertion  Cardiovascular: Negative.   Gastrointestinal: Negative.   Genitourinary: Negative.    Musculoskeletal: Negative.   Skin: Negative.   Neurological: Negative.   Hematological: Negative.   Psychiatric/Behavioral: Negative.       PHYSICAL EXAMINATION  ECOG PERFORMANCE STATUS: 1 - Symptomatic but completely ambulatory  Vitals:   08/19/17 1506  BP: (!) 160/74  Pulse: 94  Resp: 18  Temp: 97.9 F (36.6 C)  SpO2: 97%    Physical Exam  Constitutional: He is oriented to person, place, and time and well-developed, well-nourished, and in no distress. No distress.  HENT:  Head: Normocephalic and atraumatic.  Mouth/Throat: No oropharyngeal exudate.  Eyes: Conjunctivae are normal. Right eye exhibits no discharge. Left eye exhibits no discharge. No scleral icterus.  Neck: Normal  range of motion. Neck supple.  Cardiovascular: Normal rate, regular rhythm, normal heart sounds and intact distal pulses.   Pulmonary/Chest: Effort normal. No respiratory distress. He has wheezes. He has no rales.  Abdominal: Soft. Bowel sounds are normal. He exhibits no distension and no mass. There is no tenderness.  Musculoskeletal: Normal range of motion. He exhibits no edema.  Lymphadenopathy:    He has no cervical adenopathy.  Neurological: He is alert and oriented to person, place, and time. He exhibits normal muscle tone. Gait normal. Coordination normal.  Skin: Skin is warm and dry. No rash noted. He is not diaphoretic. No erythema. No pallor.  Psychiatric: Mood, memory, affect and judgment normal.  Vitals reviewed.   LABORATORY DATA:  CBC    Component Value Date/Time   WBC 8.2 08/19/2017 1446   WBC 11.3 (H) 09/12/2015 0410   RBC 4.68 08/19/2017 1446   RBC 3.09 (L) 09/12/2015 0410   HGB 14.0 08/19/2017 1446   HCT 41.9 08/19/2017 1446   PLT 214 08/19/2017 1446   MCV 89.5 08/19/2017 1446   MCH 29.8 08/19/2017 1446   MCH 28.8 09/12/2015 0410   MCHC 33.3 08/19/2017 1446   MCHC 32.0 09/12/2015 0410   RDW 14.4 08/19/2017 1446   LYMPHSABS 0.9 08/19/2017 1446   MONOABS 0.5 08/19/2017 1446   EOSABS 0.4 08/19/2017 1446   BASOSABS 0.1 08/19/2017 1446    CMP     Component Value Date/Time   NA 138 08/19/2017 1446   K 4.1 08/19/2017 1446   CL 102 09/12/2015 0410   CO2 23 08/19/2017 1446   GLUCOSE 97 08/19/2017 1446   BUN 22.9 08/19/2017 1446   CREATININE 0.9 08/19/2017 1446   CALCIUM 9.7 08/19/2017 1446   PROT 7.6 08/19/2017 1446   ALBUMIN 3.9 08/19/2017 1446   AST 19 08/19/2017 1446   ALT 19 08/19/2017 1446   ALKPHOS 99 08/19/2017 1446   BILITOT 0.29 08/19/2017 1446   GFRNONAA >60 09/13/2015 0457   GFRAA >60 09/13/2015 0457   RADIOGRAPHIC STUDIES:  No results found.  ASSESSMENT and THERAPY PLAN:   Malignant neoplasm of lung [facility]) This is a very pleasant 76  year old white male with metastatic non-small cell lung cancer, adenocarcinoma status post short course of concurrent chemoradiation with weekly carboplatin and paclitaxel followed by 1 cycle of consolidation chemotherapy discontinued secondary to intolerance. He is currently undergoing treatment with immunotherapy with Nivolumab status post 36cycles. He was then switched every 4 week dosing of Nivolumab. He is status post 2 cycles of the every four-week Nivolumab. He  is tolerating the treatment well.  Recommend that he proceed with his next dose Nivolumab today as scheduled.  Patient will return in 4 weeks for evaluation prior to his next dose of Nivolumab.    He will receive a flu vaccine today and infusion per his request.   No orders of the defined types were placed in this encounter.   All questions were answered. The patient knows to call the clinic with any problems, questions or concerns. We can certainly see the patient much sooner if necessary.  Mikey Bussing, NP 08/19/2017

## 2017-08-19 NOTE — Patient Instructions (Signed)
[facility] Discharge Instructions for Patients Receiving Chemotherapy  Today you received the following chemotherapy agents:  Nivolumab.  To help prevent nausea and vomiting after your treatment, we encourage you to take your nausea medication as directed.   If you develop nausea and vomiting that is not controlled by your nausea medication, call the clinic.   BELOW ARE SYMPTOMS THAT SHOULD BE REPORTED IMMEDIATELY:  *FEVER GREATER THAN 100.5 F  *CHILLS WITH OR WITHOUT FEVER  NAUSEA AND VOMITING THAT IS NOT CONTROLLED WITH YOUR NAUSEA MEDICATION  *UNUSUAL SHORTNESS OF BREATH  *UNUSUAL BRUISING OR BLEEDING  TENDERNESS IN MOUTH AND THROAT WITH OR WITHOUT PRESENCE OF ULCERS  *URINARY PROBLEMS  *BOWEL PROBLEMS  UNUSUAL RASH Items with * indicate a potential emergency and should be followed up as soon as possible.  Feel free to call the clinic you have any questions or concerns. The clinic phone number is [phone].  Please show the CHEMO ALERT CARD at check-in to the Emergency Department and triage nurse.   

## 2017-08-19 NOTE — Assessment & Plan Note (Signed)
This is a very pleasant 76 year old white male with metastatic non-small cell lung cancer, adenocarcinoma status post short course of concurrent chemoradiation with weekly carboplatin and paclitaxel followed by 1 cycle of consolidation chemotherapy discontinued secondary to intolerance. He is currently undergoing treatment with immunotherapy with Nivolumab status post 36cycles. He was then switched every 4 week dosing of Nivolumab. He is status post 2 cycles of the every four-week Nivolumab. He is tolerating the treatment well.  Recommend that he proceed with his next dose Nivolumab today as scheduled.  Patient will return in 4 weeks for evaluation prior to his next dose of Nivolumab.   He will receive a flu vaccine today and infusion per his request.

## 2017-08-19 NOTE — Telephone Encounter (Signed)
Per 10/22 los.

## 2017-09-16 ENCOUNTER — Ambulatory Visit (HOSPITAL_BASED_OUTPATIENT_CLINIC_OR_DEPARTMENT_OTHER): Payer: 59 | Admitting: Oncology

## 2017-09-16 ENCOUNTER — Ambulatory Visit (HOSPITAL_BASED_OUTPATIENT_CLINIC_OR_DEPARTMENT_OTHER): Payer: 59

## 2017-09-16 ENCOUNTER — Encounter: Payer: Self-pay | Admitting: *Deleted

## 2017-09-16 ENCOUNTER — Other Ambulatory Visit (HOSPITAL_BASED_OUTPATIENT_CLINIC_OR_DEPARTMENT_OTHER): Payer: 59

## 2017-09-16 VITALS — BP 131/75 | HR 86 | Temp 98.0°F | Resp 18

## 2017-09-16 VITALS — BP 162/78 | HR 87 | Temp 97.8°F | Resp 18 | Ht 70.0 in | Wt 116.5 lb

## 2017-09-16 DIAGNOSIS — C3411 Malignant neoplasm of upper lobe, right bronchus or lung: Secondary | ICD-10-CM

## 2017-09-16 DIAGNOSIS — Z79899 Other long term (current) drug therapy: Secondary | ICD-10-CM | POA: Diagnosis not present

## 2017-09-16 DIAGNOSIS — R062 Wheezing: Secondary | ICD-10-CM | POA: Diagnosis not present

## 2017-09-16 DIAGNOSIS — R0602 Shortness of breath: Secondary | ICD-10-CM

## 2017-09-16 DIAGNOSIS — C3491 Malignant neoplasm of unspecified part of right bronchus or lung: Secondary | ICD-10-CM

## 2017-09-16 DIAGNOSIS — Z5112 Encounter for antineoplastic immunotherapy: Secondary | ICD-10-CM

## 2017-09-16 DIAGNOSIS — R5383 Other fatigue: Secondary | ICD-10-CM

## 2017-09-16 DIAGNOSIS — R05 Cough: Secondary | ICD-10-CM

## 2017-09-16 LAB — COMPREHENSIVE METABOLIC PANEL
ALBUMIN: 3.8 g/dL (ref 3.5–5.0)
ALK PHOS: 99 U/L (ref 40–150)
ALT: 19 U/L (ref 0–55)
ANION GAP: 10 meq/L (ref 3–11)
AST: 18 U/L (ref 5–34)
BILIRUBIN TOTAL: 0.31 mg/dL (ref 0.20–1.20)
BUN: 27.7 mg/dL — ABNORMAL HIGH (ref 7.0–26.0)
CALCIUM: 9.9 mg/dL (ref 8.4–10.4)
CO2: 26 mEq/L (ref 22–29)
Chloride: 106 mEq/L (ref 98–109)
Creatinine: 0.9 mg/dL (ref 0.7–1.3)
Glucose: 85 mg/dl (ref 70–140)
Potassium: 3.9 mEq/L (ref 3.5–5.1)
Sodium: 142 mEq/L (ref 136–145)
TOTAL PROTEIN: 7.6 g/dL (ref 6.4–8.3)

## 2017-09-16 LAB — CBC WITH DIFFERENTIAL/PLATELET
BASO%: 0.8 % (ref 0.0–2.0)
Basophils Absolute: 0.1 10*3/uL (ref 0.0–0.1)
EOS%: 3.3 % (ref 0.0–7.0)
Eosinophils Absolute: 0.3 10*3/uL (ref 0.0–0.5)
HEMATOCRIT: 40.3 % (ref 38.4–49.9)
HGB: 13.2 g/dL (ref 13.0–17.1)
LYMPH#: 0.9 10*3/uL (ref 0.9–3.3)
LYMPH%: 11.9 % — ABNORMAL LOW (ref 14.0–49.0)
MCH: 29.8 pg (ref 27.2–33.4)
MCHC: 32.8 g/dL (ref 32.0–36.0)
MCV: 90.8 fL (ref 79.3–98.0)
MONO#: 0.5 10*3/uL (ref 0.1–0.9)
MONO%: 6.5 % (ref 0.0–14.0)
NEUT#: 5.9 10*3/uL (ref 1.5–6.5)
NEUT%: 77.5 % — AB (ref 39.0–75.0)
Platelets: 243 10*3/uL (ref 140–400)
RBC: 4.44 10*6/uL (ref 4.20–5.82)
RDW: 14.9 % — AB (ref 11.0–14.6)
WBC: 7.6 10*3/uL (ref 4.0–10.3)

## 2017-09-16 LAB — TSH: TSH: 0.962 m(IU)/L (ref 0.320–4.118)

## 2017-09-16 MED ORDER — SODIUM CHLORIDE 0.9 % IV SOLN
Freq: Once | INTRAVENOUS | Status: AC
  Administered 2017-09-16: 16:00:00 via INTRAVENOUS

## 2017-09-16 MED ORDER — SODIUM CHLORIDE 0.9 % IV SOLN
480.0000 mg | Freq: Once | INTRAVENOUS | Status: AC
  Administered 2017-09-16: 480 mg via INTRAVENOUS
  Filled 2017-09-16: qty 48

## 2017-09-16 NOTE — Progress Notes (Signed)
[facility] OFFICE PROGRESS NOTE  Aura Dials, MD [place] [address]  DIAGNOSIS: Metastatic non-small cell lung cancer, adenocarcinoma initially diagnosed as unresectable stage IIIa (T2a, N2, M0) in July 2016.  Foundation One molecular studies reveal that he was positive for [place], [place], and STAG2 S1042f*20. He was negative for: EGFR, ALK, BRAF, MET, RET, ERBB2 and ROS1  PRIOR THERAPY: 1) Concurrent chemoradiation with chemotherapy in the form of weekly carboplatin for AUC 2 paclitaxel 45 mg/m given concurrent with radiation therapy. First cycle started 05/09/2015. Status post 7 cycles. 2) Consolidation chemotherapy with carboplatin for AUC of 5 on day 1 and gemcitabine 1000 MG/M2 on days 1 and 8 every 3 weeks. First dose 08/15/2015. Discontinued secondary to intolerance.  CURRENT THERAPY: Immunotherapy with Nivolumab 240 mg IV every 2 weeks, status post 35cycles. Nivolumab was switched to 480 mg IV every 4 weeks on 06/24/2017. Status post 3 cycles of every 4 week dosing.  INTERVAL HISTORY: Gabriel [id number].o. male returns for [place] follow-up visit by himself. Continues to tolerate every four-week Nivolumab without any changes side effect profile. The patient is feeling well today with no specific complaints except for mild fatigue.  He still continues to work full-time.  He denies having any chest pain, shortness of breath at rest. He does report dyspnea on exertion as well as a cough and wheezing. Denies hemoptysis. He denies any fevers and chills. No significant weight loss or night sweats. Denies nausea, vomiting, diarrhea, constipation. The patient is here for evaluation prior to Nivolumab.  MEDICAL HISTORY: Past Medical History:  Diagnosis Date  . Cancer ([place]    skin cancer  . Constipation    AFTER ANESTHESIA  . Difficulty sleeping   . History of nonmelanoma skin cancer   . Hypertension 10/30/2016  . Inguinal hernia    . Lung mass    right / HAS HAD RADIATION AND CHEMO FOR LUNG CANCER  . Pneumothorax on right 08/24/2015  . Productive cough    "DUE TO RADIATION"  . Radiation 05/11/15-06/21/15   NSCLCA/right lung  . Smoking 1/2 pack a day or less 09/17/2016  . Spontaneous pneumothorax MANY YRS AGO AND AGAIN 04/04/15   HISTORY OF (ONLY)  . Weight loss 04/01/2017    ALLERGIES:  is allergic to aleve [naproxen].  MEDICATIONS:  Current Outpatient Medications  Medication Sig Dispense Refill  . acetaminophen (TYLENOL) 325 MG tablet 2 tablets    . albuterol (PROVENTIL HFA) 108 (90 Base) MCG/ACT inhaler 1-2 puffs as needed    . Arginine (RA L-ARGININE) 1000 MG TABS Take 500 mg by mouth daily.     . Ascorbic Acid (VITAMIN C) 1000 MG tablet Take 1,000 mg by mouth daily. Pt takes 2 5055mtabs    . B Complex Vitamins (B COMPLEX PO) Take 1 capsule by mouth daily.     . Marland KitchenALCIUM CITRATE PO Take 1 tablet by mouth daily.    . Cyanocobalamin (VITAMIN B 12 PO) Take 1 tablet by mouth daily.    . cyclobenzaprine (FLEXERIL) 5 MG tablet Take 1 tablet (5 mg total) by mouth at bedtime as needed for muscle spasms. 15 tablet 0  . ferrous sulfate 325 (65 FE) MG tablet Take 325 mg by mouth daily with breakfast.    . guaiFENesin (MUCINEX) 600 MG 12 hr tablet Take 1 tablet (600 mg total) by mouth 2 (two) times daily as needed.    . magic mouthwash SOLN Take  5 mLs by mouth 4 (four) times daily as needed.    . magnesium 30 MG tablet Take 30 mg by mouth daily.    . methylphenidate (RITALIN) 5 MG tablet Take 1 tablet (5 mg total) by mouth 2 (two) times daily with breakfast and lunch. (Patient not taking: Reported on 05/27/2017) 60 tablet 0  . Multiple Vitamin (MULTI VITAMIN DAILY PO) Take 1 tablet by mouth daily.    . Omega-3 Fatty Acids (FISH OIL) 1000 MG CAPS Take 1,000 mg by mouth daily.    . ondansetron (ZOFRAN) 8 MG tablet Take 1 tablet (8 mg total) by mouth every 8 (eight) hours as needed for nausea or vomiting. (Patient not taking:  Reported on 05/27/2017) 20 tablet 0  . prochlorperazine (COMPAZINE) 10 MG tablet Take 1 tablet (10 mg total) by mouth every 6 (six) hours as needed for nausea or vomiting. (Patient not taking: Reported on 05/27/2017) 30 tablet 0  . saccharomyces boulardii (FLORASTOR) 250 MG capsule Take 250 mg by mouth 2 (two) times daily.    . SYMBICORT 160-4.5 MCG/ACT inhaler INHALE 2 PUFFS TWICE DAILY 10.2 Inhaler 5  . TUDORZA PRESSAIR 400 MCG/ACT AEPB INHALE 1 PUFF INTO THE LUNGS TWICE DAILY 1 each 3  . vitamin A 10000 UNIT capsule 1 capsule with food or milk     No current facility-administered medications for this visit.     SURGICAL HISTORY:  Past Surgical History:  Procedure Laterality Date  . CHEST TUBE INSERTION  07/28/2015  . COLONOSCOPY W/ BIOPSIES AND POLYPECTOMY    . HERNIA REPAIR  2011  . INSERTION OF SECOND RIGHT CHEST TUBE WITH FLUROSCOPY Right 08/02/2015   Performed by Grace Isaac, MD at [place]  . OPEN LEFT INGUINAL HERNIA REPAIR Left 07/15/2015   Performed by Johnathan Hausen, MD at [facility]  . TONSILLECTOMY    . VIDEO BRONCHOSCOPY WITH ATTEMPTED INSERTION OF INTERBRONCHIAL VALVE (IBV) WITH FLUROSCOPY N/A 08/02/2015   Performed by Grace Isaac, MD at [place]  . VIDEO BRONCHOSCOPY WITH ENDOBRONCHIAL ULTRASOUND N/A 04/18/2015   Performed by Grace Isaac, MD at [place] EXTRACTION      REVIEW OF SYSTEMS:   Review of Systems  Constitutional: Negative for appetite change, chills, fever and unexpected weight change. Positive for mild fatigue. HENT:   Negative for mouth sores, nosebleeds, sore throat and trouble swallowing.   Eyes: Negative for eye problems and icterus.  Respiratory: Negative for cough, hemoptysis, shortness of breath and wheezing.   Cardiovascular: Negative for chest pain and leg swelling.  Gastrointestinal: Negative for abdominal pain, constipation, diarrhea, nausea and vomiting.  Genitourinary: Negative for bladder incontinence, difficulty  urinating, dysuria, frequency and hematuria.   Musculoskeletal: Negative for back pain, gait problem, neck pain and neck stiffness.  Skin: Negative for itching and rash.  Neurological: Negative for dizziness, extremity weakness, gait problem, headaches, light-headedness and seizures.  Hematological: Negative for adenopathy. Does not bruise/bleed easily.  Psychiatric/Behavioral: Negative for confusion, depression and sleep disturbance. The patient is not nervous/anxious.     PHYSICAL EXAMINATION:  Blood pressure (!) 162/78, pulse 87, temperature 97.8 F (36.6 C), temperature source Oral, resp. rate 18, height 5' 10"  (1.778 m), weight 116 lb 8 oz (52.8 kg), SpO2 98 %.  ECOG PERFORMANCE STATUS: 1 - Symptomatic but completely ambulatory  Physical Exam  Constitutional: Oriented to person, place, and time and well-developed, well-nourished, and in no distress. No distress.  HENT:  Head: Normocephalic and atraumatic.  Mouth/Throat: Oropharynx is clear and moist. No oropharyngeal exudate.  Eyes: Conjunctivae are normal. Right eye exhibits no discharge. Left eye exhibits no discharge. No scleral icterus.  Neck: Normal range of motion. Neck supple.  Cardiovascular: Normal rate, regular rhythm, normal heart sounds and intact distal pulses.   Pulmonary/Chest: Effort normal and breath sounds normal. No respiratory distress. No wheezes. No rales.  Abdominal: Soft. Bowel sounds are normal. Exhibits no distension and no mass. There is no tenderness.  Musculoskeletal: Normal range of motion. Exhibits no edema.  Lymphadenopathy:    No cervical adenopathy.  Neurological: Alert and oriented to person, place, and time. Exhibits normal muscle tone. Gait normal. Coordination normal.  Skin: Skin is warm and dry. No rash noted. Not diaphoretic. No erythema. No pallor.  Psychiatric: Mood, memory and judgment normal.  Vitals reviewed.  LABORATORY DATA: Lab Results  Component Value Date   WBC 7.6 09/16/2017    HGB 13.2 09/16/2017   HCT 40.3 09/16/2017   MCV 90.8 09/16/2017   PLT 243 09/16/2017      Chemistry      Component Value Date/Time   NA 142 09/16/2017 1423   K 3.9 09/16/2017 1423   CL 102 09/12/2015 0410   CO2 26 09/16/2017 1423   BUN 27.7 (H) 09/16/2017 1423   CREATININE 0.9 09/16/2017 1423      Component Value Date/Time   CALCIUM 9.9 09/16/2017 1423   ALKPHOS 99 09/16/2017 1423   AST 18 09/16/2017 1423   ALT 19 09/16/2017 1423   BILITOT 0.31 09/16/2017 1423       RADIOGRAPHIC STUDIES:  No results found.   ASSESSMENT/PLAN:  Malignant neoplasm of upper lobe of right lung [facility]) This is a very pleasant 76 year old white male with metastatic non-small cell lung cancer, adenocarcinoma status post short course of concurrent chemoradiation with weekly carboplatin and paclitaxel followed by 1 cycle of consolidation chemotherapy discontinued secondary to intolerance. He is currently undergoing treatment with immunotherapy with Nivolumab status post 36cycles. He was then switched every 4 week dosing of Nivolumab. He is status post 3 cycles of the every four-week Nivolumab. He is tolerating the treatment well.  Recommend that he proceed with his next dose Nivolumab today as scheduled. The patient will have a restaging CT scan of the chest, abdomen, pelvis prior to his next visit.  Patient will return in 4 weeks for evaluation prior to his next dose ofNivolumab and to review his restaging CT scan results.    All questions were answered. The patient knows to call the clinic with any problems, questions or concerns. We can certainly see the patient much sooner if necessary.   Orders Placed This Encounter  Procedures  . CT ABDOMEN PELVIS W CONTRAST    Standing Status:   Future    Standing Expiration Date:   09/16/2018    Order Specific Question:   If indicated for the ordered procedure, I authorize the administration of contrast media per Radiology protocol    Answer:    Yes    Order Specific Question:   Preferred imaging location?    Answer:   [facility]    Order Specific Question:   Radiology Contrast Protocol - do NOT remove file path    Answer:   [email or web address]    Order Specific Question:   Reason for Exam additional comments    Answer:   lung cancer. restaging.  . CT CHEST W CONTRAST    Standing Status:   Future  Standing Expiration Date:   09/16/2018    Order Specific Question:   If indicated for the ordered procedure, I authorize the administration of contrast media per Radiology protocol    Answer:   Yes    Order Specific Question:   Preferred imaging location?    Answer:   [facility]    Order Specific Question:   Radiology Contrast Protocol - do NOT remove file path    Answer:   [email or web address]    Mikey Bussing, DNP, AGPCNP-BC, AOCNP 09/17/17

## 2017-09-16 NOTE — Patient Instructions (Signed)
[facility] Discharge Instructions for Patients Receiving Chemotherapy  Today you received the following chemotherapy agents:  Nivolumab.  To help prevent nausea and vomiting after your treatment, we encourage you to take your nausea medication as directed.   If you develop nausea and vomiting that is not controlled by your nausea medication, call the clinic.   BELOW ARE SYMPTOMS THAT SHOULD BE REPORTED IMMEDIATELY:  *FEVER GREATER THAN 100.5 F  *CHILLS WITH OR WITHOUT FEVER  NAUSEA AND VOMITING THAT IS NOT CONTROLLED WITH YOUR NAUSEA MEDICATION  *UNUSUAL SHORTNESS OF BREATH  *UNUSUAL BRUISING OR BLEEDING  TENDERNESS IN MOUTH AND THROAT WITH OR WITHOUT PRESENCE OF ULCERS  *URINARY PROBLEMS  *BOWEL PROBLEMS  UNUSUAL RASH Items with * indicate a potential emergency and should be followed up as soon as possible.  Feel free to call the clinic you have any questions or concerns. The clinic phone number is [phone].  Please show the CHEMO ALERT CARD at check-in to the Emergency Department and triage nurse.   

## 2017-09-17 ENCOUNTER — Encounter: Payer: Self-pay | Admitting: Oncology

## 2017-09-17 NOTE — Assessment & Plan Note (Signed)
This is a very pleasant 76 year old white male with metastatic non-small cell lung cancer, adenocarcinoma status post short course of concurrent chemoradiation with weekly carboplatin and paclitaxel followed by 1 cycle of consolidation chemotherapy discontinued secondary to intolerance. He is currently undergoing treatment with immunotherapy with Nivolumab status post 36cycles. He was then switched every 4 week dosing of Nivolumab. He is status post 3 cycles of the every four-week Nivolumab. He is tolerating the treatment well.  Recommend that he proceed with his next dose Nivolumab today as scheduled. The patient will have a restaging CT scan of the chest, abdomen, pelvis prior to his next visit.  Patient will return in 4 weeks for evaluation prior to his next dose ofNivolumab and to review his restaging CT scan results.    All questions were answered. The patient knows to call the clinic with any problems, questions or concerns. We can certainly see the patient much sooner if necessary.

## 2017-10-11 ENCOUNTER — Ambulatory Visit (HOSPITAL_COMMUNITY)
Admission: RE | Admit: 2017-10-11 | Discharge: 2017-10-11 | Disposition: A | Discharge status: Home / Self Care | Payer: 59 | Source: Ambulatory Visit | Attending: Oncology | Admitting: Oncology

## 2017-10-11 ENCOUNTER — Encounter (HOSPITAL_COMMUNITY): Payer: Self-pay | Admitting: Radiology

## 2017-10-11 DIAGNOSIS — M4184 Other forms of scoliosis, thoracic region: Secondary | ICD-10-CM | POA: Insufficient documentation

## 2017-10-11 DIAGNOSIS — I700 Atherosclerosis of aorta: Secondary | ICD-10-CM | POA: Diagnosis not present

## 2017-10-11 DIAGNOSIS — C3411 Malignant neoplasm of upper lobe, right bronchus or lung: Secondary | ICD-10-CM | POA: Insufficient documentation

## 2017-10-11 DIAGNOSIS — C7951 Secondary malignant neoplasm of bone: Secondary | ICD-10-CM | POA: Insufficient documentation

## 2017-10-11 DIAGNOSIS — I77811 Abdominal aortic ectasia: Secondary | ICD-10-CM | POA: Diagnosis not present

## 2017-10-11 DIAGNOSIS — I2510 Atherosclerotic heart disease of native coronary artery without angina pectoris: Secondary | ICD-10-CM | POA: Diagnosis not present

## 2017-10-11 DIAGNOSIS — M4186 Other forms of scoliosis, lumbar region: Secondary | ICD-10-CM | POA: Diagnosis not present

## 2017-10-11 MED ORDER — IOPAMIDOL (ISOVUE-300) INJECTION 61%
INTRAVENOUS | Status: AC
  Administered 2017-10-11: 100 mL via INTRAVENOUS
  Filled 2017-10-11: qty 100

## 2017-10-14 ENCOUNTER — Other Ambulatory Visit (HOSPITAL_BASED_OUTPATIENT_CLINIC_OR_DEPARTMENT_OTHER): Payer: 59

## 2017-10-14 ENCOUNTER — Encounter: Payer: Self-pay | Admitting: Internal Medicine

## 2017-10-14 ENCOUNTER — Ambulatory Visit (HOSPITAL_BASED_OUTPATIENT_CLINIC_OR_DEPARTMENT_OTHER): Payer: 59

## 2017-10-14 ENCOUNTER — Ambulatory Visit (HOSPITAL_BASED_OUTPATIENT_CLINIC_OR_DEPARTMENT_OTHER): Payer: 59 | Admitting: Internal Medicine

## 2017-10-14 ENCOUNTER — Telehealth: Payer: Self-pay | Admitting: Internal Medicine

## 2017-10-14 VITALS — BP 173/84 | HR 93 | Temp 97.7°F | Resp 18 | Wt 114.9 lb

## 2017-10-14 DIAGNOSIS — Z5112 Encounter for antineoplastic immunotherapy: Secondary | ICD-10-CM

## 2017-10-14 DIAGNOSIS — Z79899 Other long term (current) drug therapy: Secondary | ICD-10-CM

## 2017-10-14 DIAGNOSIS — R0609 Other forms of dyspnea: Secondary | ICD-10-CM

## 2017-10-14 DIAGNOSIS — I10 Essential (primary) hypertension: Secondary | ICD-10-CM

## 2017-10-14 DIAGNOSIS — C3491 Malignant neoplasm of unspecified part of right bronchus or lung: Secondary | ICD-10-CM

## 2017-10-14 DIAGNOSIS — R05 Cough: Secondary | ICD-10-CM

## 2017-10-14 DIAGNOSIS — R5383 Other fatigue: Secondary | ICD-10-CM

## 2017-10-14 DIAGNOSIS — C3411 Malignant neoplasm of upper lobe, right bronchus or lung: Secondary | ICD-10-CM

## 2017-10-14 LAB — COMPREHENSIVE METABOLIC PANEL
ALT: 20 U/L (ref 0–55)
ANION GAP: 11 meq/L (ref 3–11)
AST: 21 U/L (ref 5–34)
Albumin: 3.9 g/dL (ref 3.5–5.0)
Alkaline Phosphatase: 111 U/L (ref 40–150)
BILIRUBIN TOTAL: 0.26 mg/dL (ref 0.20–1.20)
BUN: 27.2 mg/dL — ABNORMAL HIGH (ref 7.0–26.0)
CHLORIDE: 104 meq/L (ref 98–109)
CO2: 24 meq/L (ref 22–29)
Calcium: 9.5 mg/dL (ref 8.4–10.4)
Creatinine: 0.9 mg/dL (ref 0.7–1.3)
GLUCOSE: 95 mg/dL (ref 70–140)
Potassium: 3.9 mEq/L (ref 3.5–5.1)
SODIUM: 139 meq/L (ref 136–145)
TOTAL PROTEIN: 7.8 g/dL (ref 6.4–8.3)

## 2017-10-14 LAB — CBC WITH DIFFERENTIAL/PLATELET
BASO%: 0.5 % (ref 0.0–2.0)
BASOS ABS: 0 10*3/uL (ref 0.0–0.1)
EOS ABS: 0.2 10*3/uL (ref 0.0–0.5)
EOS%: 2.4 % (ref 0.0–7.0)
HEMATOCRIT: 39.8 % (ref 38.4–49.9)
HEMOGLOBIN: 13.1 g/dL (ref 13.0–17.1)
LYMPH#: 0.8 10*3/uL — AB (ref 0.9–3.3)
LYMPH%: 10.3 % — ABNORMAL LOW (ref 14.0–49.0)
MCH: 29.9 pg (ref 27.2–33.4)
MCHC: 33 g/dL (ref 32.0–36.0)
MCV: 90.6 fL (ref 79.3–98.0)
MONO#: 0.5 10*3/uL (ref 0.1–0.9)
MONO%: 6.3 % (ref 0.0–14.0)
NEUT#: 5.9 10*3/uL (ref 1.5–6.5)
NEUT%: 80.5 % — AB (ref 39.0–75.0)
PLATELETS: 263 10*3/uL (ref 140–400)
RBC: 4.39 10*6/uL (ref 4.20–5.82)
RDW: 14.3 % (ref 11.0–14.6)
WBC: 7.4 10*3/uL (ref 4.0–10.3)

## 2017-10-14 MED ORDER — SODIUM CHLORIDE 0.9 % IV SOLN
Freq: Once | INTRAVENOUS | Status: AC
  Administered 2017-10-14: 17:00:00 via INTRAVENOUS

## 2017-10-14 MED ORDER — SODIUM CHLORIDE 0.9 % IV SOLN
480.0000 mg | Freq: Once | INTRAVENOUS | Status: AC
  Administered 2017-10-14: 480 mg via INTRAVENOUS
  Filled 2017-10-14: qty 48

## 2017-10-14 NOTE — Telephone Encounter (Signed)
Scheduled appt per 12/17 los - Gave patient AVS and calender per los.

## 2017-10-14 NOTE — Patient Instructions (Signed)
[facility] Discharge Instructions for Patients Receiving Chemotherapy  Today you received the following chemotherapy agents Opdivo  To help prevent nausea and vomiting after your treatment, we encourage you to take your nausea medication as directed   If you develop nausea and vomiting that is not controlled by your nausea medication, call the clinic.   BELOW ARE SYMPTOMS THAT SHOULD BE REPORTED IMMEDIATELY:  *FEVER GREATER THAN 100.5 F  *CHILLS WITH OR WITHOUT FEVER  NAUSEA AND VOMITING THAT IS NOT CONTROLLED WITH YOUR NAUSEA MEDICATION  *UNUSUAL SHORTNESS OF BREATH  *UNUSUAL BRUISING OR BLEEDING  TENDERNESS IN MOUTH AND THROAT WITH OR WITHOUT PRESENCE OF ULCERS  *URINARY PROBLEMS  *BOWEL PROBLEMS  UNUSUAL RASH Items with * indicate a potential emergency and should be followed up as soon as possible.  Feel free to call the clinic should you have any questions or concerns. The clinic phone number is [phone].  Please show the CHEMO ALERT CARD at check-in to the Emergency Department and triage nurse.   

## 2017-10-14 NOTE — Progress Notes (Signed)
[facility] [phone]   [phone]  OFFICE PROGRESS NOTE  Aura Dials, MD [place] [address]  DIAGNOSIS: Metastatic non-small cell lung cancer, adenocarcinoma initially diagnosed as unresectable stage IIIa (T2a, N2, M0) in July 2016.  Foundation One molecular studies reveal that he was positive for [place], [place], and STAG2 [id number]. He was negative for: EGFR, ALK, BRAF, MET, RET, ERBB2 and ROS1  PRIOR THERAPY: 1) Concurrent chemoradiation with chemotherapy in the form of weekly carboplatin for AUC 2 paclitaxel 45 mg/m given concurrent with radiation therapy. First cycle started 05/09/2015. Status post 7 cycles. 2) Consolidation chemotherapy with carboplatin for AUC of 5 on day 1 and gemcitabine 1000 MG/M2 on days 1 and 8 every 3 weeks. First dose 08/15/2015. Discontinued secondary to intolerance. 3)  Immunotherapy with Nivolumab 240 mg IV every 2 weeks, status post 35 cycles.  CURRENT THERAPY: Immunotherapy with Nivolumab 240 mg IV every 4 weeks status post 4 cycles.  INTERVAL HISTORY: Gabriel [id number].[id number]. male returns to the clinic today for follow-up visit accompanied by his son and daughter.  The patient has no complaints today.  He continues to tolerate his current immunotherapy fairly well with no significant adverse effects.  He denied having any skin rash, itching or diarrhea.  He denied having any chest pain but continues to have shortness of breath with exertion with mild cough and no hemoptysis.  He has no significant weight loss or night sweats.  He has no nausea, vomiting, diarrhea or constipation.  The patient had a repeat CT scan of the chest, abdomen and pelvis performed recently and he is here for evaluation and discussion of his discuss results.  MEDICAL HISTORY: Past Medical History:  Diagnosis Date  . [facility] [place]    [place]  . Constipation    AFTER ANESTHESIA  . Difficulty  sleeping   . History of nonmelanoma skin cancer   . Hypertension 10/30/2016  . Inguinal hernia   . Lung mass    right / HAS HAD RADIATION AND CHEMO FOR LUNG CANCER  . Pneumothorax on right 08/24/2015  . Productive cough    "DUE TO RADIATION"  . Radiation 05/11/15-06/21/15   NSCLCA/right lung  . Smoking 1/2 pack a day or less 09/17/2016  . Spontaneous pneumothorax MANY YRS AGO AND AGAIN 04/04/15   HISTORY OF (ONLY)  . Weight loss 04/01/2017    ALLERGIES:  is allergic to aleve [naproxen].  MEDICATIONS:  Current Outpatient Medications  Medication Sig Dispense Refill  . acetaminophen (TYLENOL) 325 MG tablet 2 tablets    . albuterol (PROVENTIL HFA) 108 (90 Base) MCG/ACT inhaler 1-2 puffs as needed    . Arginine (RA L-ARGININE) 1000 MG TABS Take 500 mg by mouth daily.     . Ascorbic Acid (VITAMIN C) 1000 MG tablet Take 1,000 mg by mouth daily. Pt takes 2 5017mtabs    . B Complex Vitamins (B COMPLEX PO) Take 1 capsule by mouth daily.     . Marland KitchenALCIUM CITRATE PO Take 1 tablet by mouth daily.    . Cyanocobalamin (VITAMIN B 12 PO) Take 1 tablet by mouth daily.    . cyclobenzaprine (FLEXERIL) 5 MG tablet Take 1 tablet (5 mg total) by mouth at bedtime as needed for muscle spasms. 15 tablet 0  . ferrous sulfate 325 (65 FE) MG tablet Take 325 mg by mouth daily with breakfast.    . guaiFENesin (MULeon  600 MG 12 hr tablet Take 1 tablet (600 mg total) by mouth 2 (two) times daily as needed.    . magic mouthwash SOLN Take 5 mLs by mouth 4 (four) times daily as needed.    . magnesium 30 MG tablet Take 30 mg by mouth daily.    . methylphenidate (RITALIN) 5 MG tablet Take 1 tablet (5 mg total) by mouth 2 (two) times daily with breakfast and lunch. (Patient not taking: Reported on 05/27/2017) 60 tablet 0  . Multiple Vitamin (MULTI VITAMIN DAILY PO) Take 1 tablet by mouth daily.    . Omega-3 Fatty Acids (FISH OIL) 1000 MG CAPS Take 1,000 mg by mouth daily.    . ondansetron (ZOFRAN) 8 MG tablet Take 1 tablet (8  mg total) by mouth every 8 (eight) hours as needed for nausea or vomiting. (Patient not taking: Reported on 05/27/2017) 20 tablet 0  . prochlorperazine (COMPAZINE) 10 MG tablet Take 1 tablet (10 mg total) by mouth every 6 (six) hours as needed for nausea or vomiting. (Patient not taking: Reported on 05/27/2017) 30 tablet 0  . saccharomyces boulardii (FLORASTOR) 250 MG capsule Take 250 mg by mouth 2 (two) times daily.    . SYMBICORT 160-4.5 MCG/ACT inhaler INHALE 2 PUFFS TWICE DAILY 10.2 Inhaler 5  . TUDORZA PRESSAIR 400 MCG/ACT AEPB INHALE 1 PUFF INTO THE LUNGS TWICE DAILY 1 each 3  . vitamin A 10000 UNIT capsule 1 capsule with food or milk     No current facility-administered medications for this visit.     SURGICAL HISTORY:  Past Surgical History:  Procedure Laterality Date  . CHEST TUBE INSERTION Right 08/02/2015   Procedure: INSERTION OF SECOND RIGHT CHEST TUBE WITH FLUROSCOPY;  Surgeon: Grace Isaac, MD;  Location: [facility];  Service: Thoracic;  Laterality: Right;  . CHEST TUBE INSERTION  07/28/2015  . COLONOSCOPY W/ BIOPSIES AND POLYPECTOMY    . HERNIA REPAIR  2011  . INGUINAL HERNIA REPAIR Left 07/15/2015   Procedure: OPEN LEFT INGUINAL HERNIA REPAIR;  Surgeon: Johnathan Hausen, MD;  Location: [facility];  Service: General;  Laterality: Left;  With MESH  . TONSILLECTOMY    . VIDEO BRONCHOSCOPY WITH ENDOBRONCHIAL ULTRASOUND N/A 04/18/2015   Procedure: VIDEO BRONCHOSCOPY WITH ENDOBRONCHIAL ULTRASOUND;  Surgeon: Grace Isaac, MD;  Location: [facility];  Service: Thoracic;  Laterality: N/A;  . VIDEO BRONCHOSCOPY WITH INSERTION OF INTERBRONCHIAL VALVE (IBV) N/A 08/02/2015   Procedure: VIDEO BRONCHOSCOPY WITH ATTEMPTED INSERTION OF INTERBRONCHIAL VALVE (IBV) WITH FLUROSCOPY;  Surgeon: Grace Isaac, MD;  Location: [facility];  Service: Thoracic;  Laterality: N/A;  . WISDOM TOOTH EXTRACTION      REVIEW OF SYSTEMS:  Constitutional: negative Eyes: negative Ears, nose, mouth, throat, and face:  negative Respiratory: positive for cough and dyspnea on exertion Cardiovascular: negative Gastrointestinal: negative Genitourinary:negative Integument/breast: negative Hematologic/lymphatic: negative Musculoskeletal:negative Neurological: negative Behavioral/Psych: negative Endocrine: negative Allergic/Immunologic: negative   PHYSICAL EXAMINATION: General appearance: alert, cooperative and no distress Head: Normocephalic, without obvious abnormality, atraumatic Neck: no adenopathy, no JVD, supple, symmetrical, trachea midline and thyroid not enlarged, symmetric, no tenderness/mass/nodules Lymph nodes: Cervical, supraclavicular, and axillary nodes normal. Resp: clear to auscultation bilaterally Back: symmetric, no curvature. ROM normal. No CVA tenderness. Cardio: regular rate and rhythm, S1, S2 normal, no murmur, click, rub or gallop GI: soft, non-tender; bowel sounds normal; no masses,  no organomegaly Extremities: extremities normal, atraumatic, no cyanosis or edema Neurologic: Alert and oriented X 3, normal strength and tone. Normal symmetric reflexes. Normal  coordination and gait  ECOG PERFORMANCE STATUS: 1 - Symptomatic but completely ambulatory  Blood pressure (!) 173/84, pulse 93, temperature 97.7 F (36.5 C), temperature source Oral, resp. rate 18, weight 114 lb 14.4 oz (52.1 kg), SpO2 99 %.  LABORATORY DATA: Lab Results  Component Value Date   WBC 7.4 10/14/2017   HGB 13.1 10/14/2017   HCT 39.8 10/14/2017   MCV 90.6 10/14/2017   PLT 263 10/14/2017      Chemistry      Component Value Date/Time   NA 142 09/16/2017 1423   K 3.9 09/16/2017 1423   CL 102 09/12/2015 0410   CO2 26 09/16/2017 1423   BUN 27.7 (H) 09/16/2017 1423   CREATININE 0.9 09/16/2017 1423      Component Value Date/Time   CALCIUM 9.9 09/16/2017 1423   ALKPHOS 99 09/16/2017 1423   AST 18 09/16/2017 1423   ALT 19 09/16/2017 1423   BILITOT 0.31 09/16/2017 1423       RADIOGRAPHIC  STUDIES: Ct Chest W Contrast  Result Date: 10/12/2017 CLINICAL DATA:  Followup lung cancer. Status post chemotherapy and XRT. EXAM: CT CHEST, ABDOMEN, AND PELVIS WITH CONTRAST TECHNIQUE: Multidetector CT imaging of the chest, abdomen and pelvis was performed following the standard protocol during bolus administration of intravenous contrast. CONTRAST:  120m ISOVUE-300 IOPAMIDOL (ISOVUE-300) INJECTION 61% COMPARISON:  07/19/2017 FINDINGS: CT CHEST FINDINGS Cardiovascular: Normal heart size. No pericardial effusion. Aortic atherosclerosis. Calcification in the RCA and LAD and left circumflex coronary artery is noted. No pericardial effusion. Mediastinum/Nodes: The trachea appears patent. Normal appearance of the esophagus. Prominent mediastinal lymph nodes are again noted. Index right paratracheal node measures 1.1 cm, image 22 of series 2. Unchanged from previous exam. Index right posterior hilar soft tissue measures 2.2 by 2.7 cm, image 30 of series 2. Previously 1.9 x 2.7 cm. No axillary or supraclavicular adenopathy. Lungs/Pleura: Mass-like architectural distortion, fibrosis and bronchiectasis within the right upper lung zone is again noted and appears similar to the previous exam. No pleural effusion identified. Bilateral pulmonary nodules are again noted compatible with metastatic disease. Index nodule within the superior segment of right lower lobe m measures 8 mm, image 103 of series 4. Unchanged from previous exam. Anterior right upper lobe pulmonary nodule measures 1.2 cm, image 108 of series 4. Previously 1.3 cm. Index part solid nodule in the left lower lobe measures 1.9 by 1.8 cm, image 134 of series 4. Previously 1.7 x 1.7 cm. Solid nodule within the superior segment of left lower lobe is unchanged measuring 8 mm, image 60 of series 4. Nodule within the posterolateral left upper lobe is unchanged measuring 1 cm. Musculoskeletal: Postoperative changes involving the right anterolateral ribs  identified. Scoliosis deformity involves the thoracic and lumbar spine. T8 compression fracture is stable from previous study. Unchanged mixed lytic and sclerotic T11 lesion. The associated soft tissue component involving the right paraspinal soft tissues measures 2.2 x 3.4 x 2.4 cm, image 47 of series 2. Unchanged from previous exam. No new or progressive disease identified. CT ABDOMEN PELVIS FINDINGS Hepatobiliary: Calcified granuloma identified in the right lobe. No suspicious liver abnormality. Gallbladder normal. No biliary dilatation. Pancreas: Unremarkable. No pancreatic ductal dilatation or surrounding inflammatory changes. Spleen: Normal in size without focal abnormality. Adrenals/Urinary Tract: Right adrenal adenoma unchanged measuring 1.8 cm. The kidneys are unremarkable. No mass or hydronephrosis. The urinary bladder is normal. Stomach/Bowel: Stomach is within normal limits. Appendix appears normal. No evidence of bowel wall thickening, distention, or inflammatory changes. Vascular/Lymphatic:  Aortic atherosclerosis. Infrarenal abdominal aortic ectasia measures 2.5 cm, image 72 of series 2. No abdominal or pelvic adenopathy. Reproductive: Prostate gland measures 5.5 by 4.6 by 4.3 cm (volume = 57 cm^3). Other: No abdominal wall hernia or abnormality. No abdominopelvic ascites. Musculoskeletal: Sclerotic metastasis involving the right iliac bone is unchanged, image 87 of series 2. Stable left sacral wing sclerotic metastasis measuring 1.9 cm, image 91 of series 2. No progressive bone metastases identified. IMPRESSION: 1. Overall no significant interval change compared with 07/19/2017. 2. Similar appearance of bilateral pulmonary nodularity compatible with metastatic disease. 3. Stable paraspinal soft tissue at the T11 level with associated mixed lytic and sclerotic changes involving the T11 vertebral body. 4. No significant change and sclerotic metastasis involving the right iliac bone and left sacral wing.  5.  Aortic Atherosclerosis (ICD10-I70.0). Electronically Signed   By: Kerby Moors M.D.   On: 10/12/2017 15:44   Ct Abdomen Pelvis W Contrast  Result Date: 10/12/2017 CLINICAL DATA:  Followup lung cancer. Status post chemotherapy and XRT. EXAM: CT CHEST, ABDOMEN, AND PELVIS WITH CONTRAST TECHNIQUE: Multidetector CT imaging of the chest, abdomen and pelvis was performed following the standard protocol during bolus administration of intravenous contrast. CONTRAST:  138m ISOVUE-300 IOPAMIDOL (ISOVUE-300) INJECTION 61% COMPARISON:  07/19/2017 FINDINGS: CT CHEST FINDINGS Cardiovascular: Normal heart size. No pericardial effusion. Aortic atherosclerosis. Calcification in the RCA and LAD and left circumflex coronary artery is noted. No pericardial effusion. Mediastinum/Nodes: The trachea appears patent. Normal appearance of the esophagus. Prominent mediastinal lymph nodes are again noted. Index right paratracheal node measures 1.1 cm, image 22 of series 2. Unchanged from previous exam. Index right posterior hilar soft tissue measures 2.2 by 2.7 cm, image 30 of series 2. Previously 1.9 x 2.7 cm. No axillary or supraclavicular adenopathy. Lungs/Pleura: Mass-like architectural distortion, fibrosis and bronchiectasis within the right upper lung zone is again noted and appears similar to the previous exam. No pleural effusion identified. Bilateral pulmonary nodules are again noted compatible with metastatic disease. Index nodule within the superior segment of right lower lobe m measures 8 mm, image 103 of series 4. Unchanged from previous exam. Anterior right upper lobe pulmonary nodule measures 1.2 cm, image 108 of series 4. Previously 1.3 cm. Index part solid nodule in the left lower lobe measures 1.9 by 1.8 cm, image 134 of series 4. Previously 1.7 x 1.7 cm. Solid nodule within the superior segment of left lower lobe is unchanged measuring 8 mm, image 60 of series 4. Nodule within the posterolateral left upper lobe  is unchanged measuring 1 cm. Musculoskeletal: Postoperative changes involving the right anterolateral ribs identified. Scoliosis deformity involves the thoracic and lumbar spine. T8 compression fracture is stable from previous study. Unchanged mixed lytic and sclerotic T11 lesion. The associated soft tissue component involving the right paraspinal soft tissues measures 2.2 x 3.4 x 2.4 cm, image 47 of series 2. Unchanged from previous exam. No new or progressive disease identified. CT ABDOMEN PELVIS FINDINGS Hepatobiliary: Calcified granuloma identified in the right lobe. No suspicious liver abnormality. Gallbladder normal. No biliary dilatation. Pancreas: Unremarkable. No pancreatic ductal dilatation or surrounding inflammatory changes. Spleen: Normal in size without focal abnormality. Adrenals/Urinary Tract: Right adrenal adenoma unchanged measuring 1.8 cm. The kidneys are unremarkable. No mass or hydronephrosis. The urinary bladder is normal. Stomach/Bowel: Stomach is within normal limits. Appendix appears normal. No evidence of bowel wall thickening, distention, or inflammatory changes. Vascular/Lymphatic: Aortic atherosclerosis. Infrarenal abdominal aortic ectasia measures 2.5 cm, image 72 of series 2. No  abdominal or pelvic adenopathy. Reproductive: Prostate gland measures 5.5 by 4.6 by 4.3 cm (volume = 57 cm^3). Other: No abdominal wall hernia or abnormality. No abdominopelvic ascites. Musculoskeletal: Sclerotic metastasis involving the right iliac bone is unchanged, image 87 of series 2. Stable left sacral wing sclerotic metastasis measuring 1.9 cm, image 91 of series 2. No progressive bone metastases identified. IMPRESSION: 1. Overall no significant interval change compared with 07/19/2017. 2. Similar appearance of bilateral pulmonary nodularity compatible with metastatic disease. 3. Stable paraspinal soft tissue at the T11 level with associated mixed lytic and sclerotic changes involving the T11 vertebral  body. 4. No significant change and sclerotic metastasis involving the right iliac bone and left sacral wing. 5.  Aortic Atherosclerosis (ICD10-I70.0). Electronically Signed   By: Kerby Moors M.D.   On: 10/12/2017 15:44    ASSESSMENT AND PLAN: This is a very pleasant 76 years old white male with metastatic non-small cell lung cancer, adenocarcinoma status post short course of concurrent chemoradiation with weekly carboplatin and paclitaxel followed by 1 cycle of consolidation chemotherapy discontinued secondary to intolerance. He was a started on treatment with immunotherapy with Nivolumab 240 mg IV every 2 weeks status post 35 cycles.  He tolerated the previous treatment well with no significant adverse effects. His treatment was switched to a Nivolumab 480 mg IV every 4 weeks status post 4 cycles.  He is also tolerating this treatment fairly well with no significant adverse effects. The patient had repeat CT scan of the chest, abdomen and pelvis performed recently.  I personally and independently reviewed the scan images and discussed the results with the patient and his family. Has a scan showed a stable disease with mild increase in some of the pulmonary nodules and decreased in others. I recommended for the patient to continue his current treatment with immunotherapy and he will proceed with cycle #5 today. For hypertension, I strongly encouraged the patient to keep monitoring his blood pressure closely and to take his blood pressure medication as prescribed. The patient will come back for follow-up visit in 4 weeks for evaluation before starting cycle #6. He was advised to call immediately if he has any concerning symptoms in the interval. The patient voices understanding of current disease status and treatment options and is in agreement with the current care plan. All questions were answered. The patient knows to call the clinic with any problems, questions or concerns. We can certainly see  the patient much sooner if necessary.  Disclaimer: This note was dictated with voice recognition software. Similar sounding words can inadvertently be transcribed and may not be corrected upon review.

## 2017-10-15 LAB — TSH: TSH: 1.056 m(IU)/L (ref 0.320–4.118)

## 2017-11-11 ENCOUNTER — Inpatient Hospital Stay: Payer: 59

## 2017-11-11 ENCOUNTER — Telehealth: Payer: Self-pay | Admitting: Oncology

## 2017-11-11 ENCOUNTER — Other Ambulatory Visit: Payer: Self-pay

## 2017-11-11 ENCOUNTER — Inpatient Hospital Stay: Payer: 59 | Attending: Oncology | Admitting: Oncology

## 2017-11-11 ENCOUNTER — Encounter: Payer: Self-pay | Admitting: Oncology

## 2017-11-11 VITALS — BP 165/87 | HR 92 | Temp 97.7°F | Resp 19 | Ht 70.0 in | Wt 114.4 lb

## 2017-11-11 DIAGNOSIS — C3411 Malignant neoplasm of upper lobe, right bronchus or lung: Secondary | ICD-10-CM

## 2017-11-11 DIAGNOSIS — R0602 Shortness of breath: Secondary | ICD-10-CM | POA: Diagnosis not present

## 2017-11-11 DIAGNOSIS — Z5112 Encounter for antineoplastic immunotherapy: Secondary | ICD-10-CM

## 2017-11-11 DIAGNOSIS — C3491 Malignant neoplasm of unspecified part of right bronchus or lung: Secondary | ICD-10-CM

## 2017-11-11 DIAGNOSIS — I10 Essential (primary) hypertension: Secondary | ICD-10-CM | POA: Insufficient documentation

## 2017-11-11 DIAGNOSIS — K5900 Constipation, unspecified: Secondary | ICD-10-CM | POA: Insufficient documentation

## 2017-11-11 DIAGNOSIS — R05 Cough: Secondary | ICD-10-CM | POA: Insufficient documentation

## 2017-11-11 DIAGNOSIS — Z85828 Personal history of other malignant neoplasm of skin: Secondary | ICD-10-CM | POA: Diagnosis not present

## 2017-11-11 DIAGNOSIS — Z79899 Other long term (current) drug therapy: Secondary | ICD-10-CM | POA: Insufficient documentation

## 2017-11-11 DIAGNOSIS — F17210 Nicotine dependence, cigarettes, uncomplicated: Secondary | ICD-10-CM | POA: Insufficient documentation

## 2017-11-11 DIAGNOSIS — K449 Diaphragmatic hernia without obstruction or gangrene: Secondary | ICD-10-CM | POA: Diagnosis not present

## 2017-11-11 DIAGNOSIS — Z9223 Personal history of estrogen therapy: Secondary | ICD-10-CM | POA: Diagnosis not present

## 2017-11-11 LAB — CBC WITH DIFFERENTIAL/PLATELET
BASOS PCT: 1 %
Basophils Absolute: 0.1 10*3/uL (ref 0.0–0.1)
EOS ABS: 0.2 10*3/uL (ref 0.0–0.5)
EOS PCT: 2 %
HEMATOCRIT: 40 % (ref 38.4–49.9)
Hemoglobin: 13.3 g/dL (ref 13.0–17.1)
Lymphocytes Relative: 12 %
Lymphs Abs: 0.9 10*3/uL (ref 0.9–3.3)
MCH: 30.2 pg (ref 27.2–33.4)
MCHC: 33.3 g/dL (ref 32.0–36.0)
MCV: 90.6 fL (ref 79.3–98.0)
MONO ABS: 0.6 10*3/uL (ref 0.1–0.9)
MONOS PCT: 7 %
Neutro Abs: 6.1 10*3/uL (ref 1.5–6.5)
Neutrophils Relative %: 78 %
PLATELETS: 262 10*3/uL (ref 140–400)
RBC: 4.42 MIL/uL (ref 4.20–5.82)
RDW: 14.4 % (ref 11.0–15.6)
WBC: 7.7 10*3/uL (ref 4.0–10.3)

## 2017-11-11 LAB — COMPREHENSIVE METABOLIC PANEL
ALK PHOS: 114 U/L (ref 40–150)
ALT: 22 U/L (ref 0–55)
AST: 22 U/L (ref 5–34)
Albumin: 3.7 g/dL (ref 3.5–5.0)
Anion gap: 10 (ref 3–11)
BILIRUBIN TOTAL: 0.4 mg/dL (ref 0.2–1.2)
BUN: 25 mg/dL (ref 7–26)
CALCIUM: 9.7 mg/dL (ref 8.4–10.4)
CO2: 27 mmol/L (ref 22–29)
CREATININE: 0.88 mg/dL (ref 0.70–1.30)
Chloride: 102 mmol/L (ref 98–109)
GFR calc Af Amer: >60 mL/min (ref >60)
GFR calc non Af Amer: >60 mL/min (ref >60)
GLUCOSE: 82 mg/dL (ref 70–140)
Potassium: 3.6 mmol/L (ref 3.5–5.1)
SODIUM: 139 mmol/L (ref 136–145)
TOTAL PROTEIN: 7.7 g/dL (ref 6.4–8.3)

## 2017-11-11 LAB — TSH: TSH: 0.777 u[IU]/mL (ref 0.320–4.118)

## 2017-11-11 MED ORDER — SODIUM CHLORIDE 0.9 % IV SOLN
Freq: Once | INTRAVENOUS | Status: AC
  Administered 2017-11-11: 17:00:00 via INTRAVENOUS

## 2017-11-11 MED ORDER — SODIUM CHLORIDE 0.9 % IV SOLN
480.0000 mg | Freq: Once | INTRAVENOUS | Status: AC
  Administered 2017-11-11: 480 mg via INTRAVENOUS
  Filled 2017-11-11: qty 48

## 2017-11-11 NOTE — Telephone Encounter (Signed)
Scheduled appt per 1/14 los - Patient to get an updated schedule next visit.

## 2017-11-11 NOTE — Patient Instructions (Signed)
[facility] Discharge Instructions for Patients Receiving Chemotherapy  Today you received the following chemotherapy agents:  Nivolumab  To help prevent nausea and vomiting after your treatment, we encourage you to take your nausea medication as prescribed.   If you develop nausea and vomiting that is not controlled by your nausea medication, call the clinic.   BELOW ARE SYMPTOMS THAT SHOULD BE REPORTED IMMEDIATELY:  *FEVER GREATER THAN 100.5 F  *CHILLS WITH OR WITHOUT FEVER  NAUSEA AND VOMITING THAT IS NOT CONTROLLED WITH YOUR NAUSEA MEDICATION  *UNUSUAL SHORTNESS OF BREATH  *UNUSUAL BRUISING OR BLEEDING  TENDERNESS IN MOUTH AND THROAT WITH OR WITHOUT PRESENCE OF ULCERS  *URINARY PROBLEMS  *BOWEL PROBLEMS  UNUSUAL RASH Items with * indicate a potential emergency and should be followed up as soon as possible.  Feel free to call the clinic should you have any questions or concerns. The clinic phone number is [phone].  Please show the CHEMO ALERT CARD at check-in to the Emergency Department and triage nurse.   

## 2017-11-11 NOTE — Progress Notes (Signed)
[facility] OFFICE PROGRESS NOTE  Aura Dials, MD [place] [address]  DIAGNOSIS: Metastatic non-small cell lung cancer, adenocarcinoma initially diagnosed as unresectable stage IIIa (T2a, N2, M0) in July 2016.  Foundation One molecular studies reveal that he was positive for [place], [place], and STAG2 [id number]. He was negative for: EGFR, ALK, BRAF, MET, RET, ERBB2 and ROS1  PRIOR THERAPY: 1) Concurrent chemoradiation with chemotherapy in the form of weekly carboplatin for AUC 2 paclitaxel 45 mg/m given concurrent with radiation therapy. First cycle started 05/09/2015. Status post 7 cycles. 2) Consolidation chemotherapy with carboplatin for AUC of 5 on day 1 and gemcitabine 1000 MG/M2 on days 1 and 8 every 3 weeks. First dose 08/15/2015. Discontinued secondary to intolerance. 3)  Immunotherapy with Nivolumab 240 mg IV every 2 weeks, status post 35 cycles.  CURRENT THERAPY: Immunotherapy with Nivolumab 240 mg IV every 4 weeks status post 5 cycles.  INTERVAL HISTORY: Gabriel [id number].[id number]. male returns for routine follow-up visit coming by his daughter.  The patient has no complaints today he continues to tolerate treatment with immunotherapy fairly well with no significant adverse effects.  He denies fevers and chills.  Denies chest pain but continues to have shortness of breath with exertion mild cough without hemoptysis.  He denies any significant weight loss or night sweats.  Denies nausea, vomiting, constipation, diarrhea.  He denies any skin rash or itching.  The patient is here for evaluation prior to cycle 6 of his immunotherapy.  MEDICAL HISTORY: Past Medical History:  Diagnosis Date  . Cancer ([place]    skin cancer  . Constipation    AFTER ANESTHESIA  . Difficulty sleeping   . History of nonmelanoma skin cancer   . Hypertension 10/30/2016  . Inguinal hernia   . Lung mass    right / HAS HAD RADIATION AND CHEMO FOR LUNG CANCER   . Pneumothorax on right 08/24/2015  . Productive cough    "DUE TO RADIATION"  . Radiation 05/11/15-06/21/15   NSCLCA/right lung  . Smoking 1/2 pack a day or less 09/17/2016  . Spontaneous pneumothorax MANY YRS AGO AND AGAIN 04/04/15   HISTORY OF (ONLY)  . Weight loss 04/01/2017    ALLERGIES:  is allergic to aleve [naproxen].  MEDICATIONS:  Current Outpatient Medications  Medication Sig Dispense Refill  . acetaminophen (TYLENOL) 325 MG tablet 2 tablets    . albuterol (PROVENTIL HFA) 108 (90 Base) MCG/ACT inhaler 1-2 puffs as needed    . Arginine (RA L-ARGININE) 1000 MG TABS Take 500 mg by mouth daily.     . Ascorbic Acid (VITAMIN C) 1000 MG tablet Take 1,000 mg by mouth daily. Pt takes 2 5080mtabs    . B Complex Vitamins (B COMPLEX PO) Take 1 capsule by mouth daily.     . Marland KitchenALCIUM CITRATE PO Take 1 tablet by mouth daily.    . Cyanocobalamin (VITAMIN B 12 PO) Take 1 tablet by mouth daily.    . ferrous sulfate 325 (65 FE) MG tablet Take 325 mg by mouth daily with breakfast.    . guaiFENesin (MUCINEX) 600 MG 12 hr tablet Take 1 tablet (600 mg total) by mouth 2 (two) times daily as needed.    . magic mouthwash SOLN Take 5 mLs by mouth 4 (four) times daily as needed.    . magnesium 30 MG tablet Take 30 mg by mouth daily.    . Multiple Vitamin (MULTI VITAMIN DAILY  PO) Take 1 tablet by mouth daily.    . Omega-3 Fatty Acids (FISH OIL) 1000 MG CAPS Take 1,000 mg by mouth daily.    Marland Kitchen saccharomyces boulardii (FLORASTOR) 250 MG capsule Take 250 mg by mouth 2 (two) times daily.    . SYMBICORT 160-4.5 MCG/ACT inhaler INHALE 2 PUFFS TWICE DAILY 10.2 Inhaler 5  . TUDORZA PRESSAIR 400 MCG/ACT AEPB INHALE 1 PUFF INTO THE LUNGS TWICE DAILY 1 each 3  . vitamin A 10000 UNIT capsule 1 capsule with food or milk    . cyclobenzaprine (FLEXERIL) 5 MG tablet Take 1 tablet (5 mg total) by mouth at bedtime as needed for muscle spasms. (Patient not taking: Reported on 11/11/2017) 15 tablet 0  . methylphenidate  (RITALIN) 5 MG tablet Take 1 tablet (5 mg total) by mouth 2 (two) times daily with breakfast and lunch. (Patient not taking: Reported on 05/27/2017) 60 tablet 0  . ondansetron (ZOFRAN) 8 MG tablet Take 1 tablet (8 mg total) by mouth every 8 (eight) hours as needed for nausea or vomiting. (Patient not taking: Reported on 05/27/2017) 20 tablet 0  . prochlorperazine (COMPAZINE) 10 MG tablet Take 1 tablet (10 mg total) by mouth every 6 (six) hours as needed for nausea or vomiting. (Patient not taking: Reported on 05/27/2017) 30 tablet 0   No current facility-administered medications for this visit.    Facility-Administered Medications Ordered in Other Visits  Medication Dose Route Frequency Provider Last Rate Last Dose  . 0.9 %  sodium chloride infusion   Intravenous Once Curt Bears, MD        SURGICAL HISTORY:  Past Surgical History:  Procedure Laterality Date  . CHEST TUBE INSERTION Right 08/02/2015   Procedure: INSERTION OF SECOND RIGHT CHEST TUBE WITH FLUROSCOPY;  Surgeon: Grace Isaac, MD;  Location: [place];  Service: Thoracic;  Laterality: Right;  . CHEST TUBE INSERTION  07/28/2015  . COLONOSCOPY W/ BIOPSIES AND POLYPECTOMY    . HERNIA REPAIR  2011  . INGUINAL HERNIA REPAIR Left 07/15/2015   Procedure: OPEN LEFT INGUINAL HERNIA REPAIR;  Surgeon: Johnathan Hausen, MD;  [facility];  Service: General;  Laterality: Left;  With MESH  . TONSILLECTOMY    . VIDEO BRONCHOSCOPY WITH ENDOBRONCHIAL ULTRASOUND N/A 04/18/2015   Procedure: VIDEO BRONCHOSCOPY WITH ENDOBRONCHIAL ULTRASOUND;  Surgeon: Grace Isaac, MD;  Location: [place];  Service: Thoracic;  Laterality: N/A;  . VIDEO BRONCHOSCOPY WITH INSERTION OF INTERBRONCHIAL VALVE (IBV) N/A 08/02/2015   Procedure: VIDEO BRONCHOSCOPY WITH ATTEMPTED INSERTION OF INTERBRONCHIAL VALVE (IBV) WITH FLUROSCOPY;  Surgeon: Grace Isaac, MD;  [facility];  Service: Thoracic;  Laterality: N/A;  . WISDOM TOOTH EXTRACTION      REVIEW OF  SYSTEMS:   Review of Systems  Constitutional: Negative for appetite change, chills, fatigue, fever and unexpected weight change.  HENT:   Negative for mouth sores, nosebleeds, sore throat and trouble swallowing.   Eyes: Negative for eye problems and icterus.  Respiratory: Negative for hemoptysis, shortness of breath at rest and wheezing.  Positive for cough and shortness of breath with exertion. Cardiovascular: Negative for chest pain and leg swelling.  Gastrointestinal: Negative for abdominal pain, constipation, diarrhea, nausea and vomiting.  Genitourinary: Negative for bladder incontinence, difficulty urinating, dysuria, frequency and hematuria.   Musculoskeletal: Negative for back pain, gait problem, neck pain and neck stiffness.  Skin: Negative for itching and rash.  Neurological: Negative for dizziness, extremity weakness, gait problem, headaches, light-headedness and seizures.  Hematological: Negative for  adenopathy. Does not bruise/bleed easily.  Psychiatric/Behavioral: Negative for confusion, depression and sleep disturbance. The patient is not nervous/anxious.     PHYSICAL EXAMINATION:  Blood pressure (!) 165/87, pulse 92, temperature 97.7 F (36.5 C), temperature source Oral, resp. rate 19, height 5' 10"  (1.778 m), weight 114 lb 6.4 oz (51.9 kg), SpO2 98 %.  ECOG PERFORMANCE STATUS: 1 - Symptomatic but completely ambulatory  Physical Exam  Constitutional: Oriented to person, place, and time and well-developed, well-nourished, and in no distress. No distress.  HENT:  Head: Normocephalic and atraumatic.  Mouth/Throat: Oropharynx is clear and moist. No oropharyngeal exudate.  Eyes: Conjunctivae are normal. Right eye exhibits no discharge. Left eye exhibits no discharge. No scleral icterus.  Neck: Normal range of motion. Neck supple.  Cardiovascular: Normal rate, regular rhythm, normal heart sounds and intact distal pulses.   Pulmonary/Chest: Effort normal and breath sounds  normal. No respiratory distress. No wheezes. No rales.  Abdominal: Soft. Bowel sounds are normal. Exhibits no distension and no mass. There is no tenderness.  Musculoskeletal: Normal range of motion. Exhibits no edema.  Lymphadenopathy:    No cervical adenopathy.  Neurological: Alert and oriented to person, place, and time. Exhibits normal muscle tone. Gait normal. Coordination normal.  Skin: Skin is warm and dry. No rash noted. Not diaphoretic. No erythema. No pallor.  Psychiatric: Mood, memory and judgment normal.  Vitals reviewed.  LABORATORY DATA: Lab Results  Component Value Date   WBC 7.7 11/11/2017   HGB 13.3 11/11/2017   HCT 40.0 11/11/2017   MCV 90.6 11/11/2017   PLT 262 11/11/2017      Chemistry      Component Value Date/Time   NA 139 11/11/2017 1422   NA 139 10/14/2017 1523   K 3.6 11/11/2017 1422   K 3.9 10/14/2017 1523   CL 102 11/11/2017 1422   CO2 27 11/11/2017 1422   CO2 24 10/14/2017 1523   BUN 25 11/11/2017 1422   BUN 27.2 (H) 10/14/2017 1523   CREATININE 0.88 11/11/2017 1422   CREATININE 0.9 10/14/2017 1523      Component Value Date/Time   CALCIUM 9.7 11/11/2017 1422   CALCIUM 9.5 10/14/2017 1523   ALKPHOS 114 11/11/2017 1422   ALKPHOS 111 10/14/2017 1523   AST 22 11/11/2017 1422   AST 21 10/14/2017 1523   ALT 22 11/11/2017 1422   ALT 20 10/14/2017 1523   BILITOT 0.4 11/11/2017 1422   BILITOT 0.26 10/14/2017 1523       RADIOGRAPHIC STUDIES:  No results found.   ASSESSMENT/PLAN:  Malignant neoplasm of lung [facility]) This is a very pleasant 77 year old white male with metastatic non-small cell lung cancer, adenocarcinoma status post short course of concurrent chemoradiation with weekly carboplatin and paclitaxel followed by 1 cycle of consolidation chemotherapy discontinued secondary to intolerance. He was a started on treatment with immunotherapy with Nivolumab 240 mg IV every 2 weeks status post 35 cycles.  He tolerated the previous treatment  well with no significant adverse effects. His treatment was switched to a Nivolumab 480 mg IV every 4 weeks status post 5 cycles.  He is also tolerating this treatment fairly well with no significant adverse effects. Recommend for the patient to proceed with cycle 6 of immunotherapy today as scheduled.  For hypertension, I strongly encouraged the patient to keep monitoring his blood pressure closely and to take his blood pressure medication as prescribed.  The patient will come back for follow-up visit in 4 weeks for evaluation before  starting cycle #7.  He was advised to call immediately if he has any concerning symptoms in the interval. The patient voices understanding of current disease status and treatment options and is in agreement with the current care plan. All questions were answered. The patient knows to call the clinic with any problems, questions or concerns. We can certainly see the patient much sooner if necessary.   Mikey Bussing, DNP, AGPCNP-BC, AOCNP 11/11/17

## 2017-11-11 NOTE — Assessment & Plan Note (Signed)
This is a very pleasant 77 year old white male with metastatic non-small cell lung cancer, adenocarcinoma status post short course of concurrent chemoradiation with weekly carboplatin and paclitaxel followed by 1 cycle of consolidation chemotherapy discontinued secondary to intolerance. He was a started on treatment with immunotherapy with Nivolumab 240 mg IV every 2 weeks status post 35 cycles.  He tolerated the previous treatment well with no significant adverse effects. His treatment was switched to a Nivolumab 480 mg IV every 4 weeks status post 5 cycles.  He is also tolerating this treatment fairly well with no significant adverse effects. Recommend for the patient to proceed with cycle 6 of immunotherapy today as scheduled.  For hypertension, I strongly encouraged the patient to keep monitoring his blood pressure closely and to take his blood pressure medication as prescribed.  The patient will come back for follow-up visit in 4 weeks for evaluation before starting cycle #7.  He was advised to call immediately if he has any concerning symptoms in the interval. The patient voices understanding of current disease status and treatment options and is in agreement with the current care plan. All questions were answered. The patient knows to call the clinic with any problems, questions or concerns. We can certainly see the patient much sooner if necessary.

## 2017-12-04 ENCOUNTER — Ambulatory Visit: Payer: 59 | Admitting: Internal Medicine

## 2017-12-04 ENCOUNTER — Encounter: Payer: Self-pay | Admitting: Internal Medicine

## 2017-12-04 VITALS — BP 132/80 | HR 99 | Ht 70.0 in | Wt 112.6 lb

## 2017-12-04 DIAGNOSIS — J441 Chronic obstructive pulmonary disease with (acute) exacerbation: Secondary | ICD-10-CM | POA: Diagnosis not present

## 2017-12-04 MED ORDER — DOXYCYCLINE HYCLATE 100 MG PO TABS: 100.0000 mg | ORAL_TABLET | Freq: Two times a day (BID) | ORAL | 0 refills | Status: DC

## 2017-12-04 MED ORDER — PREDNISONE 10 MG PO TABS: ORAL_TABLET | ORAL | 0 refills | Status: DC

## 2017-12-04 NOTE — Patient Instructions (Addendum)
ICD-10-CM   1. COPD exacerbation (HCC) J44.1     Take doxycycline 100mg  po twice daily x 5 days; take after meals and avoid sunlight  Take prednisone 40 mg daily x 2 days, then 20mg  daily x 2 days, then 10mg  daily x 2 days, then 5mg  daily x 2 days and stop   Continue tudorza and symbicort as before  Quit smoking  Do incentive sprimetry and flutter valve hourly during day time  Try aerobic exercise and light weights - for fatigue; if this does not work we can refer you to pulm rehab  Please talk to PCP Gabriel Dials, MD -  and ensure you get  shingarix vaccine  Pneumovax vaccine at next visit or with PCP Gabriel Dials, MD (our records indicate you only have had Prevnar)  followup 3-6 months or sooner if needeed

## 2017-12-04 NOTE — Progress Notes (Signed)
Subjective:     Patient ID: Gabriel [name], male   DOB: [birth year], 77 y.o.   MRN: [id number]  HPI OV 02/13/2017  Chief Complaint  Patient presents with  . Follow-up    Pt saw SG in Feb 2018 for an acute visit. Pt states he is improved and states his breathing is back to baseline. Pt c/o prod cough with white mucus - pt states this is baseline. Pt denies CP/tightness and f/c/s.    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  He is on immune therapy for his advanced lung cancer. He had CT scan of the chest 02/01/2017 the visualized overall he will fibrosis is significantly improved in the last 2 years. There is some nodules which she believes do not represent cancer and is on continued immunotherapy. At this point in time he feels stable but with good functional status. He is fatigued though he did try pulmonary rehabilitation it did not help. He also has some amount of cough with mild white mucus. Prednisone a few months ago helped him a lot he took that for 4 weeks. He is on Symbicort. He does feel he can be better with the symptoms. He is not interested in chronic daily prednisone      OV 05/22/2017  Chief Complaint  Patient presents with  . Follow-up    Pt states since starting the tudorza his breathing has improved. Pt c/o prod cough with white mucus. Pt denies CP/tightness and f/c/s.     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  He is on immune therapy for his advanced lung cancer. This visit is to follow-up addition of anticholinergic agent Tudorza to his Symbicort to see if it improves symptoms. He says he's had a modest improvement and shortness of breath because of this addition. Overall good functional status. He continues to work. He continues to complain of significant fatigue at  the end of the day when he returns from work. Previously pulmonary rehabilitation would help. He says that he's had previous thyroid function test checked and it was normal. He takes vitamin D supplement but does not know if he's had a level checked Walking desaturation test 185 feet 3 laps on room air the forehead probe:    OV 12/04/2017  Chief Complaint  Patient presents with  . 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 STage 4 NSCLC   STill on nivolumab Rx. In dec 2018 had CT and show stable metastatic lung cancer. Last nivolumab recnetly. Reports 1 month ago had cold and since recovering by self having harder time coughing and more chest tightnes. Smoking now. COPD cat score 19. Also reports chronic stable fatigue; workds full time [employer] 0 so unable to do rehab unless I insist     CAT COPD Symptom & Quality of Life Score (GSK trademark) 0 is no burden. 5 is highest burden 12/04/17   Never Cough -> Cough all the time 4  No phlegm in chest -> Chest is full of phlegm 4  No chest tightness -> Chest feels very tight 0  No  dyspnea for 1 flight stairs/hill -> Very dyspneic for 1 flight of stairs 3  No limitations for ADL at home -> Very limited with ADL at home 2  Confident leaving home -> Not at all confident leaving home 0  Sleep soundly -> Do not sleep soundly because of lung condition 1  Lots of Energy -> No energy at all 5  TOTAL Score (max 40)  19    IMPRESSION: CT chest 1. Overall no significant interval change compared with 07/19/2017. 2. Similar appearance of bilateral pulmonary nodularity compatible with metastatic disease. 3. Stable paraspinal soft tissue at the T11 level with associated mixed lytic and sclerotic changes  involving the T11 vertebral body. 4. No significant change and sclerotic metastasis involving the right iliac bone and left sacral wing. 5.  Aortic Atherosclerosis (ICD10-I70.0).   Electronically Signed   By: Kerby Moors M.D.   On: 10/12/2017 15:44   has a past medical history of Cancer ([place]), Constipation, Difficulty sleeping, History of nonmelanoma skin cancer, Hypertension (10/30/2016), Inguinal hernia, Lung mass, Pneumothorax on right (08/24/2015), Productive cough, Radiation (05/11/15-06/21/15), Smoking 1/2 pack a day or less (09/17/2016), Spontaneous pneumothorax (MANY YRS AGO AND AGAIN 04/04/15), and Weight loss (04/01/2017).   reports that he has been smoking cigarettes.  He has a 13.75 pack-year smoking history. he has never used smokeless tobacco.  Past Surgical History:  Procedure Laterality Date  . CHEST TUBE INSERTION Right 08/02/2015   Procedure: INSERTION OF SECOND RIGHT CHEST TUBE WITH FLUROSCOPY;  Surgeon: Grace Isaac, MD;  Location: [place];  Service: Thoracic;  Laterality: Right;  . CHEST TUBE INSERTION  07/28/2015  . COLONOSCOPY W/ BIOPSIES AND POLYPECTOMY    . HERNIA REPAIR  2011  . INGUINAL HERNIA REPAIR Left 07/15/2015   Procedure: OPEN LEFT INGUINAL HERNIA REPAIR;  Surgeon: Johnathan Hausen, MD;  [facility];  Service: General;  Laterality: Left;  With MESH  . TONSILLECTOMY    . VIDEO BRONCHOSCOPY WITH ENDOBRONCHIAL ULTRASOUND N/A 04/18/2015   Procedure: VIDEO BRONCHOSCOPY WITH ENDOBRONCHIAL ULTRASOUND;  Surgeon: Grace Isaac, MD;  Location: [place];  Service: Thoracic;  Laterality: N/A;  . VIDEO BRONCHOSCOPY WITH INSERTION OF INTERBRONCHIAL VALVE (IBV) N/A 08/02/2015   Procedure: VIDEO BRONCHOSCOPY WITH ATTEMPTED INSERTION OF INTERBRONCHIAL VALVE (IBV) WITH FLUROSCOPY;  Surgeon: Grace Isaac, MD;  [facility];  Service: Thoracic;  Laterality: N/A;  . WISDOM TOOTH EXTRACTION      Allergies  Allergen Reactions  . Aleve [Naproxen] Rash     Immunization History  Administered Date(s) Administered  . Influenza, High Dose Seasonal PF 08/19/2017  . Influenza,inj,Quad PF,6+ Mos 09/17/2016  . Pneumococcal Conjugate-13 02/14/2016    Family History  Problem Relation Age of Onset  . Heart attack Father   . Heart disease Father   . Hypertension Mother      Current Outpatient Medications:  .  acetaminophen (TYLENOL) 325 MG tablet, 2 tablets, Disp: , Rfl:  .  Arginine (RA L-ARGININE) 1000 MG TABS, Take 500 mg by mouth daily. , Disp: , Rfl:  .  Ascorbic Acid (VITAMIN C) 1000 MG tablet, Take 1,000 mg by mouth daily. Pt takes 2 500mg  tabs, Disp: , Rfl:  .  B Complex Vitamins (B COMPLEX PO), Take 1 capsule by mouth daily. , Disp: , Rfl:  .  CALCIUM CITRATE PO, Take 1 tablet by mouth daily., Disp: , Rfl:  .  Cyanocobalamin (VITAMIN B 12 PO), Take 1 tablet by mouth daily., Disp: , Rfl:  .  ferrous sulfate 325 (65 FE) MG tablet, Take 325 mg by mouth daily with breakfast., Disp: , Rfl:  .  guaiFENesin (MUCINEX) 600 MG 12 hr tablet, Take 1 tablet (600 mg total) by mouth 2 (two) times daily as needed., Disp: , Rfl:  .  magic mouthwash SOLN, Take 5 mLs by mouth 4 (four) times daily as needed., Disp: , Rfl:  .  magnesium 30 MG tablet, Take 30 mg by mouth daily., Disp: , Rfl:  .  Multiple Vitamin (MULTI VITAMIN DAILY PO), Take 1 tablet by mouth daily., Disp: , Rfl:  .  Omega-3 Fatty Acids (FISH OIL) 1000 MG CAPS, Take 1,000 mg by mouth daily., Disp: , Rfl:  .  saccharomyces boulardii (FLORASTOR) 250 MG capsule, Take 250 mg by mouth 2 (two) times daily., Disp: , Rfl:  .  SYMBICORT 160-4.5 MCG/ACT inhaler, INHALE 2 PUFFS TWICE DAILY, Disp: 10.2 Inhaler, Rfl: 5 .  TUDORZA PRESSAIR 400 MCG/ACT AEPB, INHALE 1 PUFF INTO THE LUNGS TWICE DAILY, Disp: 1 each, Rfl: 3 .  vitamin A 10000 UNIT capsule, 1 capsule with food or milk, Disp: , Rfl:  .  albuterol (PROVENTIL HFA) 108 (90 Base) MCG/ACT inhaler, Inhale 2 puffs into the lungs every 6 (six) hours  as needed for wheezing or shortness of breath., Disp: , Rfl:    Review of Systems     Objective:   Physical Exam Vitals:   12/04/17 0945  BP: 132/80  Pulse: 99  SpO2: 98%  Weight: 112 lb 9.6 oz (51.1 kg)  Height: 5\' 10"  (1.778 m)   Estimated body mass index is 16.16 kg/m as calculated from the following:   Height as of this encounter: 5\' 10"  (1.778 m).   Weight as of this encounter: 112 lb 9.6 oz (51.1 kg).   General Appearance:    Looks well but low BMI wit  Head:    Normocephalic, without obvious abnormality, atraumatic  Eyes:    PERRL - yes, conjunctiva/corneas - clear      Ears:    Normal external ear canals, both ears  Nose:   NG tube - no  Throat:  ETT TUBE - no , OG tube - no  Neck:   Supple,  No enlargement/tenderness/nodules     Lungs:     Diffuse wheez4  Chest wall:    SCOLIOTIC  Heart:    S1 and S2 normal, no murmur, CVP - no.  Pressors - no  Abdomen:     Soft, no masses, no organomegaly  Genitalia:    Not done  Rectal:   not done  Extremities:   Extremities- intact     Skin:   Intact in exposed areas . S     Neurologic:   Sedation - none -> RASS - +! Marland Kitchen Moves all 4s - yes. CAM-ICU - neg . Orientation - x3+         Assessment:       ICD-10-CM   1. COPD exacerbation (HCC) J44.1        Plan:      Take doxycycline 100mg  po twice daily x 5 days; take after meals and avoid sunlight  Take prednisone 40 mg daily x 2 days, then 20mg  daily x 2 days, then 10mg  daily x 2 days, then 5mg  daily x 2 days and stop   Continue tudorza and symbicort as before  Quit smoking  Do incentive sprimetry and flutter valve hourly during day time  Try aerobic exercise and light weights -  for fatigue; if this does not work we can refer you to pulm rehab  Please talk to PCP Aura Dials, MD -  and ensure you get  shingarix vaccine  Pneumovax vaccine at next visit or with PCP Aura Dials, MD (our records indicate you only have had Prevnar)  followup 3-6  months or sooner if needeed    Dr. Brand Males, M.D., [facility].C.P Pulmonary and Critical Care Medicine Staff Physician, [place] Director - Interstitial Lung Disease  Program  Pulmonary [place] [facility] [place], [place], [address]  Pager: [phone], If no answer or between  15:00h - 7:00h: call [phone] Telephone: [phone]

## 2017-12-09 ENCOUNTER — Inpatient Hospital Stay (HOSPITAL_BASED_OUTPATIENT_CLINIC_OR_DEPARTMENT_OTHER): Payer: 59 | Admitting: Internal Medicine

## 2017-12-09 ENCOUNTER — Inpatient Hospital Stay: Payer: 59

## 2017-12-09 ENCOUNTER — Inpatient Hospital Stay: Payer: 59 | Attending: Oncology

## 2017-12-09 ENCOUNTER — Encounter: Payer: Self-pay | Admitting: Internal Medicine

## 2017-12-09 VITALS — BP 161/79 | HR 94 | Temp 97.8°F | Resp 18 | Ht 70.0 in | Wt 114.8 lb

## 2017-12-09 DIAGNOSIS — Z923 Personal history of irradiation: Secondary | ICD-10-CM | POA: Diagnosis not present

## 2017-12-09 DIAGNOSIS — Z5112 Encounter for antineoplastic immunotherapy: Secondary | ICD-10-CM | POA: Diagnosis not present

## 2017-12-09 DIAGNOSIS — I10 Essential (primary) hypertension: Secondary | ICD-10-CM | POA: Diagnosis not present

## 2017-12-09 DIAGNOSIS — C3411 Malignant neoplasm of upper lobe, right bronchus or lung: Secondary | ICD-10-CM | POA: Diagnosis not present

## 2017-12-09 DIAGNOSIS — R634 Abnormal weight loss: Secondary | ICD-10-CM

## 2017-12-09 DIAGNOSIS — K5900 Constipation, unspecified: Secondary | ICD-10-CM | POA: Insufficient documentation

## 2017-12-09 DIAGNOSIS — Z9221 Personal history of antineoplastic chemotherapy: Secondary | ICD-10-CM | POA: Insufficient documentation

## 2017-12-09 DIAGNOSIS — J40 Bronchitis, not specified as acute or chronic: Secondary | ICD-10-CM | POA: Insufficient documentation

## 2017-12-09 DIAGNOSIS — F17210 Nicotine dependence, cigarettes, uncomplicated: Secondary | ICD-10-CM

## 2017-12-09 DIAGNOSIS — Z85828 Personal history of other malignant neoplasm of skin: Secondary | ICD-10-CM | POA: Diagnosis not present

## 2017-12-09 DIAGNOSIS — C3491 Malignant neoplasm of unspecified part of right bronchus or lung: Secondary | ICD-10-CM

## 2017-12-09 DIAGNOSIS — K4090 Unilateral inguinal hernia, without obstruction or gangrene, not specified as recurrent: Secondary | ICD-10-CM | POA: Diagnosis not present

## 2017-12-09 DIAGNOSIS — Z79899 Other long term (current) drug therapy: Secondary | ICD-10-CM

## 2017-12-09 DIAGNOSIS — R5383 Other fatigue: Secondary | ICD-10-CM

## 2017-12-09 DIAGNOSIS — C3490 Malignant neoplasm of unspecified part of unspecified bronchus or lung: Secondary | ICD-10-CM

## 2017-12-09 LAB — COMPREHENSIVE METABOLIC PANEL
ALBUMIN: 3.6 g/dL (ref 3.5–5.0)
ALT: 23 U/L (ref 0–55)
ANION GAP: 11 (ref 3–11)
AST: 18 U/L (ref 5–34)
Alkaline Phosphatase: 120 U/L (ref 40–150)
BILIRUBIN TOTAL: 0.4 mg/dL (ref 0.2–1.2)
BUN: 29 mg/dL — ABNORMAL HIGH (ref 7–26)
CO2: 25 mmol/L (ref 22–29)
Calcium: 9.6 mg/dL (ref 8.4–10.4)
Chloride: 102 mmol/L (ref 98–109)
Creatinine, Ser: 0.85 mg/dL (ref 0.70–1.30)
GFR calc Af Amer: >60 mL/min (ref >60)
GFR calc non Af Amer: >60 mL/min (ref >60)
GLUCOSE: 114 mg/dL (ref 70–140)
POTASSIUM: 3.9 mmol/L (ref 3.5–5.1)
SODIUM: 138 mmol/L (ref 136–145)
Total Protein: 7.6 g/dL (ref 6.4–8.3)

## 2017-12-09 LAB — CBC WITH DIFFERENTIAL/PLATELET
BASOS ABS: 0.1 10*3/uL (ref 0.0–0.1)
Basophils Relative: 1 %
Eosinophils Absolute: 0 10*3/uL (ref 0.0–0.5)
Eosinophils Relative: 0 %
HEMATOCRIT: 40.2 % (ref 38.4–49.9)
HEMOGLOBIN: 13.1 g/dL (ref 13.0–17.1)
LYMPHS PCT: 7 %
Lymphs Abs: 0.8 10*3/uL — ABNORMAL LOW (ref 0.9–3.3)
MCH: 29.1 pg (ref 27.2–33.4)
MCHC: 32.7 g/dL (ref 32.0–36.0)
MCV: 89.1 fL (ref 79.3–98.0)
MONO ABS: 0.6 10*3/uL (ref 0.1–0.9)
Monocytes Relative: 6 %
NEUTROS ABS: 10.1 10*3/uL — AB (ref 1.5–6.5)
NEUTROS PCT: 86 %
Platelets: 350 10*3/uL (ref 140–400)
RBC: 4.51 MIL/uL (ref 4.20–5.82)
RDW: 14.3 % (ref 11.0–14.6)
WBC: 11.6 10*3/uL — AB (ref 4.0–10.3)

## 2017-12-09 MED ORDER — SODIUM CHLORIDE 0.9 % IV SOLN
480.0000 mg | Freq: Once | INTRAVENOUS | Status: AC
  Administered 2017-12-09: 480 mg via INTRAVENOUS
  Filled 2017-12-09: qty 48

## 2017-12-09 MED ORDER — SODIUM CHLORIDE 0.9 % IV SOLN
Freq: Once | INTRAVENOUS | Status: AC
  Administered 2017-12-09: 16:00:00 via INTRAVENOUS

## 2017-12-09 NOTE — Progress Notes (Signed)
**Note Gabriel-Identified via Obfuscation** Gabriel Leon [phone]   [phone]  OFFICE PROGRESS NOTE  Aura Dials, MD [place] [address]  DIAGNOSIS: Metastatic non-small cell lung cancer, adenocarcinoma initially diagnosed as unresectable stage IIIa (T2a, N2, M0) in July 2016.  Foundation One molecular studies reveal that he was positive for [place], [place], and STAG2 [id number]. He was negative for: EGFR, ALK, BRAF, MET, RET, ERBB2 and ROS1  PRIOR THERAPY: 1) Concurrent chemoradiation with chemotherapy in the form of weekly carboplatin for AUC 2 paclitaxel 45 mg/m given concurrent with radiation therapy. First cycle started 05/09/2015. Status post 7 cycles. 2) Consolidation chemotherapy with carboplatin for AUC of 5 on day 1 and gemcitabine 1000 MG/M2 on days 1 and 8 every 3 weeks. First dose 08/15/2015. Discontinued secondary to intolerance. 3)  Immunotherapy with Nivolumab 240 mg IV every 2 weeks, status post 35 cycles.  CURRENT THERAPY: Immunotherapy with Nivolumab 480 mg IV every 4 weeks status post 6 cycles.  INTERVAL HISTORY: BLINDEL MARCELL77y.o. male returns to the clinic today for follow-up visit accompanied by his daughter [name]  The patient is feeling fine today with no specific complaints.  He was recently treated for bronchitis under the care of Dr. RLarwance Sachswith doxycycline and a taper dose of prednisone.  He is finishing this course today.  He denied having any chest pain but continues to have mild shortness of breath and cough with wheezing.  He has no hemoptysis.  He denied having any recent weight loss or night sweats.  He has no nausea, vomiting, diarrhea or constipation.  He continues to tolerate his treatment with Nivolumab fairly well.  He is here today for evaluation before starting cycle #7.  MEDICAL HISTORY: Past Medical History:  Diagnosis Date  . [place]    skin cancer  . Constipation    AFTER ANESTHESIA  .  Difficulty sleeping   . History of nonmelanoma skin cancer   . Hypertension 10/30/2016  . Inguinal hernia   . Lung mass    right / HAS HAD RADIATION AND CHEMO FOR LUNG CANCER  . Pneumothorax on right 08/24/2015  . Productive cough    "DUE TO RADIATION"  . Radiation 05/11/15-06/21/15   NSCLCA/right lung  . Smoking 1/2 pack a day or less 09/17/2016  . Spontaneous pneumothorax MANY YRS AGO AND AGAIN 04/04/15   HISTORY OF (ONLY)  . Weight loss 04/01/2017    ALLERGIES:  is allergic to aleve [naproxen].  MEDICATIONS:  Current Outpatient Medications  Medication Sig Dispense Refill  . acetaminophen (TYLENOL) 325 MG tablet 2 tablets    . albuterol (PROVENTIL HFA) 108 (90 Base) MCG/ACT inhaler Inhale 2 puffs into the lungs every 6 (six) hours as needed for wheezing or shortness of breath.    . Arginine (RA L-ARGININE) 1000 MG TABS Take 500 mg by mouth daily.     . Ascorbic Acid (VITAMIN C) 1000 MG tablet Take 1,000 mg by mouth daily. Pt takes 2 5042mtabs    . B Complex Vitamins (B COMPLEX PO) Take 1 capsule by mouth daily.     . Marland KitchenALCIUM CITRATE PO Take 1 tablet by mouth daily.    . Cyanocobalamin (VITAMIN B 12 PO) Take 1 tablet by mouth daily.    . Marland Kitchenoxycycline (VIBRA-TABS) 100 MG tablet Take 1 tablet (100 mg total) by mouth 2 (two) times daily. 10 tablet 0  . ferrous sulfate 325 (65 FE) MG tablet  Take 325 mg by mouth daily with breakfast.    . guaiFENesin (MUCINEX) 600 MG 12 hr tablet Take 1 tablet (600 mg total) by mouth 2 (two) times daily as needed.    . magic mouthwash SOLN Take 5 mLs by mouth 4 (four) times daily as needed.    . magnesium 30 MG tablet Take 30 mg by mouth daily.    . Multiple Vitamin (MULTI VITAMIN DAILY PO) Take 1 tablet by mouth daily.    . Omega-3 Fatty Acids (FISH OIL) 1000 MG CAPS Take 1,000 mg by mouth daily.    . predniSONE (DELTASONE) 10 MG tablet Take 47mx2days,20mgx2days,10mgx2days,5mgx2days,then stop 13 tablet 0  . saccharomyces boulardii (FLORASTOR) 250 MG  capsule Take 250 mg by mouth 2 (two) times daily.    . SYMBICORT 160-4.5 MCG/ACT inhaler INHALE 2 PUFFS TWICE DAILY 10.2 Inhaler 5  . TUDORZA PRESSAIR 400 MCG/ACT AEPB INHALE 1 PUFF INTO THE LUNGS TWICE DAILY 1 each 3  . vitamin A 10000 UNIT capsule 1 capsule with food or milk     No current facility-administered medications for this visit.     SURGICAL HISTORY:  Past Surgical History:  Procedure Laterality Date  . CHEST TUBE INSERTION Right 08/02/2015   Procedure: INSERTION OF SECOND RIGHT CHEST TUBE WITH FLUROSCOPY;  Surgeon: EGrace Isaac MD;  [facility]  Service: Thoracic;  Laterality: Right;  . CHEST TUBE INSERTION  07/28/2015  . COLONOSCOPY W/ BIOPSIES AND POLYPECTOMY    . HERNIA REPAIR  2011  . INGUINAL HERNIA REPAIR Left 07/15/2015   Procedure: OPEN LEFT INGUINAL HERNIA REPAIR;  Surgeon: MJohnathan Hausen MD;  Location: [facility];  Service: General;  Laterality: Left;  With MESH  . TONSILLECTOMY    . VIDEO BRONCHOSCOPY WITH ENDOBRONCHIAL ULTRASOUND N/A 04/18/2015   Procedure: VIDEO BRONCHOSCOPY WITH ENDOBRONCHIAL ULTRASOUND;  Surgeon: EGrace Isaac MD;  [facility]  Service: Thoracic;  Laterality: N/A;  . VIDEO BRONCHOSCOPY WITH INSERTION OF INTERBRONCHIAL VALVE (IBV) N/A 08/02/2015   Procedure: VIDEO BRONCHOSCOPY WITH ATTEMPTED INSERTION OF INTERBRONCHIAL VALVE (IBV) WITH FLUROSCOPY;  Surgeon: EGrace Isaac MD;  Location: [facility];  Service: Thoracic;  Laterality: N/A;  . WISDOM TOOTH EXTRACTION      REVIEW OF SYSTEMS:  A comprehensive review of systems was negative except for: Respiratory: positive for cough and dyspnea on exertion   PHYSICAL EXAMINATION: General appearance: alert, cooperative and no distress Head: Normocephalic, without obvious abnormality, atraumatic Neck: no adenopathy, no JVD, supple, symmetrical, trachea midline and thyroid not enlarged, symmetric, no tenderness/mass/nodules Lymph nodes: Cervical, supraclavicular, and axillary nodes  normal. Resp: wheezes bilaterally Back: symmetric, no curvature. ROM normal. No CVA tenderness. Cardio: regular rate and rhythm, S1, S2 normal, no murmur, click, rub or gallop GI: soft, non-tender; bowel sounds normal; no masses,  no organomegaly Extremities: extremities normal, atraumatic, no cyanosis or edema  ECOG PERFORMANCE STATUS: 1 - Symptomatic but completely ambulatory  Blood pressure (!) 161/79, pulse 94, temperature 97.8 F (36.6 C), temperature source Oral, resp. rate 18, height 5' 10"  (1.778 m), weight 114 lb 12.8 oz (52.1 kg), SpO2 100 %.  LABORATORY DATA: Lab Results  Component Value Date   WBC 11.6 (H) 12/09/2017   HGB 13.1 12/09/2017   HCT 40.2 12/09/2017   MCV 89.1 12/09/2017   PLT 350 12/09/2017      Chemistry      Component Value Date/Time   NA 139 11/11/2017 1422   NA 139 10/14/2017 1523   K 3.6 11/11/2017  1422   K 3.9 10/14/2017 1523   CL 102 11/11/2017 1422   CO2 27 11/11/2017 1422   CO2 24 10/14/2017 1523   BUN 25 11/11/2017 1422   BUN 27.2 (H) 10/14/2017 1523   CREATININE 0.88 11/11/2017 1422   CREATININE 0.9 10/14/2017 1523      Component Value Date/Time   CALCIUM 9.7 11/11/2017 1422   CALCIUM 9.5 10/14/2017 1523   ALKPHOS 114 11/11/2017 1422   ALKPHOS 111 10/14/2017 1523   AST 22 11/11/2017 1422   AST 21 10/14/2017 1523   ALT 22 11/11/2017 1422   ALT 20 10/14/2017 1523   BILITOT 0.4 11/11/2017 1422   BILITOT 0.26 10/14/2017 1523       RADIOGRAPHIC STUDIES: No results found.  ASSESSMENT AND PLAN: This is a very pleasant 77 years old white male with metastatic non-small cell lung cancer, adenocarcinoma status post short course of concurrent chemoradiation with weekly carboplatin and paclitaxel followed by 1 cycle of consolidation chemotherapy discontinued secondary to intolerance. He was a started on treatment with immunotherapy with Nivolumab 240 mg IV every 2 weeks status post 35 cycles.  He tolerated the previous treatment well with  no significant adverse effects. His treatment was switched to a Nivolumab 480 mg IV every 4 weeks status post 6 cycles.   He continues to tolerate this treatment fairly well with no concerning complaints. I recommended for the patient to proceed with cycle #7 today as a scheduled. I will see him back for follow-up visit in 4 weeks for evaluation after repeating CT scan of the chest, abdomen and pelvis for restaging of his disease. The patient was advised to call immediately if he has any concerning symptoms in the interval. The patient voices understanding of current disease status and treatment options and is in agreement with the current care plan. All questions were answered. The patient knows to call the clinic with any problems, questions or concerns. We can certainly see the patient much sooner if necessary.  Disclaimer: This note was dictated with voice recognition software. Similar sounding words can inadvertently be transcribed and may not be corrected upon review.

## 2017-12-09 NOTE — Patient Instructions (Signed)
[facility] Discharge Instructions for Patients Receiving Chemotherapy  Today you received the following chemotherapy agents:  Nivolumab.  To help prevent nausea and vomiting after your treatment, we encourage you to take your nausea medication as directed.   If you develop nausea and vomiting that is not controlled by your nausea medication, call the clinic.   BELOW ARE SYMPTOMS THAT SHOULD BE REPORTED IMMEDIATELY:  *FEVER GREATER THAN 100.5 F  *CHILLS WITH OR WITHOUT FEVER  NAUSEA AND VOMITING THAT IS NOT CONTROLLED WITH YOUR NAUSEA MEDICATION  *UNUSUAL SHORTNESS OF BREATH  *UNUSUAL BRUISING OR BLEEDING  TENDERNESS IN MOUTH AND THROAT WITH OR WITHOUT PRESENCE OF ULCERS  *URINARY PROBLEMS  *BOWEL PROBLEMS  UNUSUAL RASH Items with * indicate a potential emergency and should be followed up as soon as possible.  Feel free to call the clinic you have any questions or concerns. The clinic phone number is [phone].  Please show the CHEMO ALERT CARD at check-in to the Emergency Department and triage nurse.   

## 2017-12-10 ENCOUNTER — Telehealth: Payer: Self-pay | Admitting: Internal Medicine

## 2017-12-10 LAB — TSH: TSH: 0.715 u[IU]/mL (ref 0.320–4.118)

## 2017-12-10 NOTE — Telephone Encounter (Signed)
Scheduled appt per 2/11 los - pt to get an updated schedule next visit.

## 2018-01-03 ENCOUNTER — Ambulatory Visit (HOSPITAL_COMMUNITY)
Admission: RE | Admit: 2018-01-03 | Discharge: 2018-01-03 | Disposition: A | Discharge status: Home / Self Care | Payer: 59 | Source: Ambulatory Visit | Attending: Internal Medicine | Admitting: Internal Medicine

## 2018-01-03 DIAGNOSIS — C7951 Secondary malignant neoplasm of bone: Secondary | ICD-10-CM | POA: Insufficient documentation

## 2018-01-03 DIAGNOSIS — C3490 Malignant neoplasm of unspecified part of unspecified bronchus or lung: Secondary | ICD-10-CM | POA: Diagnosis not present

## 2018-01-03 DIAGNOSIS — R590 Localized enlarged lymph nodes: Secondary | ICD-10-CM | POA: Diagnosis not present

## 2018-01-03 DIAGNOSIS — D3501 Benign neoplasm of right adrenal gland: Secondary | ICD-10-CM | POA: Insufficient documentation

## 2018-01-03 DIAGNOSIS — I2510 Atherosclerotic heart disease of native coronary artery without angina pectoris: Secondary | ICD-10-CM | POA: Insufficient documentation

## 2018-01-03 DIAGNOSIS — I700 Atherosclerosis of aorta: Secondary | ICD-10-CM | POA: Diagnosis not present

## 2018-01-03 MED ORDER — SODIUM CHLORIDE 0.9 % IJ SOLN
INTRAMUSCULAR | Status: AC
  Filled 2018-01-03: qty 50

## 2018-01-03 MED ORDER — IOPAMIDOL (ISOVUE-300) INJECTION 61%
INTRAVENOUS | Status: AC
  Administered 2018-01-03: 100 mL
  Filled 2018-01-03: qty 100

## 2018-01-04 ENCOUNTER — Other Ambulatory Visit: Payer: Self-pay | Admitting: Internal Medicine

## 2018-01-04 DIAGNOSIS — C3491 Malignant neoplasm of unspecified part of right bronchus or lung: Secondary | ICD-10-CM

## 2018-01-06 ENCOUNTER — Inpatient Hospital Stay (HOSPITAL_BASED_OUTPATIENT_CLINIC_OR_DEPARTMENT_OTHER): Payer: 59 | Admitting: Oncology

## 2018-01-06 ENCOUNTER — Inpatient Hospital Stay: Payer: 59

## 2018-01-06 ENCOUNTER — Inpatient Hospital Stay: Payer: 59 | Attending: Oncology

## 2018-01-06 ENCOUNTER — Encounter: Payer: Self-pay | Admitting: Oncology

## 2018-01-06 VITALS — BP 167/89 | HR 93 | Temp 97.5°F | Resp 18 | Ht 70.0 in | Wt 113.8 lb

## 2018-01-06 DIAGNOSIS — Z923 Personal history of irradiation: Secondary | ICD-10-CM | POA: Diagnosis not present

## 2018-01-06 DIAGNOSIS — Z9221 Personal history of antineoplastic chemotherapy: Secondary | ICD-10-CM | POA: Insufficient documentation

## 2018-01-06 DIAGNOSIS — C3411 Malignant neoplasm of upper lobe, right bronchus or lung: Secondary | ICD-10-CM | POA: Diagnosis not present

## 2018-01-06 DIAGNOSIS — K449 Diaphragmatic hernia without obstruction or gangrene: Secondary | ICD-10-CM | POA: Diagnosis not present

## 2018-01-06 DIAGNOSIS — I10 Essential (primary) hypertension: Secondary | ICD-10-CM | POA: Diagnosis not present

## 2018-01-06 DIAGNOSIS — D3501 Benign neoplasm of right adrenal gland: Secondary | ICD-10-CM | POA: Diagnosis not present

## 2018-01-06 DIAGNOSIS — Z5112 Encounter for antineoplastic immunotherapy: Secondary | ICD-10-CM

## 2018-01-06 DIAGNOSIS — C3491 Malignant neoplasm of unspecified part of right bronchus or lung: Secondary | ICD-10-CM

## 2018-01-06 DIAGNOSIS — R599 Enlarged lymph nodes, unspecified: Secondary | ICD-10-CM | POA: Diagnosis not present

## 2018-01-06 DIAGNOSIS — N400 Enlarged prostate without lower urinary tract symptoms: Secondary | ICD-10-CM | POA: Diagnosis not present

## 2018-01-06 DIAGNOSIS — Z85828 Personal history of other malignant neoplasm of skin: Secondary | ICD-10-CM | POA: Insufficient documentation

## 2018-01-06 DIAGNOSIS — F17210 Nicotine dependence, cigarettes, uncomplicated: Secondary | ICD-10-CM | POA: Diagnosis not present

## 2018-01-06 DIAGNOSIS — I700 Atherosclerosis of aorta: Secondary | ICD-10-CM | POA: Insufficient documentation

## 2018-01-06 DIAGNOSIS — Z79899 Other long term (current) drug therapy: Secondary | ICD-10-CM | POA: Diagnosis not present

## 2018-01-06 DIAGNOSIS — I2510 Atherosclerotic heart disease of native coronary artery without angina pectoris: Secondary | ICD-10-CM | POA: Diagnosis not present

## 2018-01-06 DIAGNOSIS — K5900 Constipation, unspecified: Secondary | ICD-10-CM | POA: Diagnosis not present

## 2018-01-06 DIAGNOSIS — M8580 Other specified disorders of bone density and structure, unspecified site: Secondary | ICD-10-CM | POA: Diagnosis not present

## 2018-01-06 DIAGNOSIS — C786 Secondary malignant neoplasm of retroperitoneum and peritoneum: Secondary | ICD-10-CM

## 2018-01-06 DIAGNOSIS — M899 Disorder of bone, unspecified: Secondary | ICD-10-CM | POA: Insufficient documentation

## 2018-01-06 LAB — CBC WITH DIFFERENTIAL/PLATELET
BASOS ABS: 0.1 10*3/uL (ref 0.0–0.1)
BASOS PCT: 1 %
EOS ABS: 0.2 10*3/uL (ref 0.0–0.5)
EOS PCT: 2 %
HCT: 39.4 % (ref 38.4–49.9)
Hemoglobin: 13 g/dL (ref 13.0–17.1)
Lymphocytes Relative: 9 %
Lymphs Abs: 0.8 10*3/uL — ABNORMAL LOW (ref 0.9–3.3)
MCH: 29.4 pg (ref 27.2–33.4)
MCHC: 33 g/dL (ref 32.0–36.0)
MCV: 89.1 fL (ref 79.3–98.0)
MONO ABS: 0.5 10*3/uL (ref 0.1–0.9)
MONOS PCT: 5 %
Neutro Abs: 7.5 10*3/uL — ABNORMAL HIGH (ref 1.5–6.5)
Neutrophils Relative %: 83 %
PLATELETS: 278 10*3/uL (ref 140–400)
RBC: 4.43 MIL/uL (ref 4.20–5.82)
RDW: 14.4 % (ref 11.0–14.6)
WBC: 8.9 10*3/uL (ref 4.0–10.3)

## 2018-01-06 LAB — COMPREHENSIVE METABOLIC PANEL
ALBUMIN: 3.5 g/dL (ref 3.5–5.0)
ALT: 18 U/L (ref 0–55)
ANION GAP: 10 (ref 3–11)
AST: 20 U/L (ref 5–34)
Alkaline Phosphatase: 119 U/L (ref 40–150)
BUN: 20 mg/dL (ref 7–26)
CHLORIDE: 104 mmol/L (ref 98–109)
CO2: 24 mmol/L (ref 22–29)
Calcium: 9.8 mg/dL (ref 8.4–10.4)
Creatinine, Ser: 0.82 mg/dL (ref 0.70–1.30)
GFR calc Af Amer: >60 mL/min (ref >60)
GFR calc non Af Amer: >60 mL/min (ref >60)
GLUCOSE: 99 mg/dL (ref 70–140)
POTASSIUM: 4 mmol/L (ref 3.5–5.1)
SODIUM: 138 mmol/L (ref 136–145)
TOTAL PROTEIN: 7.7 g/dL (ref 6.4–8.3)
Total Bilirubin: 0.3 mg/dL (ref 0.2–1.2)

## 2018-01-06 MED ORDER — SODIUM CHLORIDE 0.9 % IV SOLN
Freq: Once | INTRAVENOUS | Status: AC
  Administered 2018-01-06: 16:00:00 via INTRAVENOUS

## 2018-01-06 MED ORDER — NIVOLUMAB CHEMO INJECTION 100 MG/10ML
480.0000 mg | Freq: Once | INTRAVENOUS | Status: AC
  Administered 2018-01-06: 480 mg via INTRAVENOUS
  Filled 2018-01-06: qty 48

## 2018-01-06 NOTE — Patient Instructions (Signed)
[facility] Discharge Instructions for Patients Receiving Chemotherapy  Today you received the following chemotherapy agents: Nivolumab  To help prevent nausea and vomiting after your treatment, we encourage you to take your nausea medication as directed.    If you develop nausea and vomiting that is not controlled by your nausea medication, call the clinic.   BELOW ARE SYMPTOMS THAT SHOULD BE REPORTED IMMEDIATELY:  *FEVER GREATER THAN 100.5 F  *CHILLS WITH OR WITHOUT FEVER  NAUSEA AND VOMITING THAT IS NOT CONTROLLED WITH YOUR NAUSEA MEDICATION  *UNUSUAL SHORTNESS OF BREATH  *UNUSUAL BRUISING OR BLEEDING  TENDERNESS IN MOUTH AND THROAT WITH OR WITHOUT PRESENCE OF ULCERS  *URINARY PROBLEMS  *BOWEL PROBLEMS  UNUSUAL RASH Items with * indicate a potential emergency and should be followed up as soon as possible.  Feel free to call the clinic should you have any questions or concerns. The clinic phone number is [phone].  Please show the CHEMO ALERT CARD at check-in to the Emergency Department and triage nurse.   

## 2018-01-06 NOTE — Assessment & Plan Note (Signed)
This is a very pleasant 77 year old white male with metastatic non-small cell lung cancer, adenocarcinoma status post short course of concurrent chemoradiation with weekly carboplatin and paclitaxel followed by 1 cycle of consolidation chemotherapy discontinued secondary to intolerance. He was a started on treatment with immunotherapy with Nivolumab 240 mg IV every 2 weeks status post 35 cycles.  He tolerated the previous treatment well with no significant adverse effects. His treatment was switched to a Nivolumab 480 mg IV every 4 weeks status post 7 cycles.   He continues to tolerate this treatment fairly well with no concerning complaints. He had recent restaging CT scans and is here to discuss the results.  The patient was seen with Dr. Julien Nordmann.  CT scan results and images were shown to the patient and his family members.  CT scan showed a very slight increase in some of his lung nodules of about 1-2 mm.  Some of this increase could be related to recent infection.  Discussed with the family that this is not show significant progression of his disease.  Treatment options were discussed with the patient and his family including continuing on Nivolumab 480 mg IV every 4 weeks versus changing to chemotherapy versus palliative care/hospice.  The patient would like to continue on the current course of treatment with Nivolumab every 4 weeks.  The patient will proceed with cycle #8 as scheduled today.  He will have restaging CT scans in approximately 3 months. Follow-up visit will be in 4 weeks for evaluation prior to cycle #9.  The patient was advised to call immediately if he has any concerning symptoms in the interval. The patient voices understanding of current disease status and treatment options and is in agreement with the current care plan. All questions were answered. The patient knows to call the clinic with any problems, questions or concerns. We can certainly see the patient much sooner if  necessary.

## 2018-01-06 NOTE — Progress Notes (Signed)
[facility] OFFICE PROGRESS NOTE  Aura Dials, MD [place] [address]  DIAGNOSIS: Metastatic non-small cell lung cancer, adenocarcinoma initially diagnosed as unresectable stage IIIa (T2a, N2, M0) in July 2016.  Foundation One molecular studies reveal that he was positive for [place], [place], and STAG2 [id number]. He was negative for: EGFR, ALK, BRAF, MET, RET, ERBB2 and ROS1  PRIOR THERAPY: 1) Concurrent chemoradiation with chemotherapy in the form of weekly carboplatin for AUC 2 paclitaxel 45 mg/m given concurrent with radiation therapy. First cycle started 05/09/2015. Status post 7 cycles. 2) Consolidation chemotherapy with carboplatin for AUC of 5 on day 1 and gemcitabine 1000 MG/M2 on days 1 and 8 every 3 weeks. First dose 08/15/2015. Discontinued secondary to intolerance. 3)  Immunotherapy with Nivolumab 240 mg IV every 2 weeks, status post 35 cycles.  CURRENT THERAPY: Immunotherapy with Nivolumab 480 mg IV every 4 weeks status post 7 cycles.  INTERVAL HISTORY: Gabriel [id number].[id number]. male returns for routine follow-up visit accompanied by his son and daughter.  The patient is feeling fine today and has no specific complaints except for a congested cough.  He feels as though this is improving.  Patient denies fevers and chills.  Denies chest pain, shortness of breath, hemoptysis.  Denies nausea, vomiting, constipation, diarrhea.  He denies having any recent weight loss or night sweats.  He continues to tolerate his treatment with Nivolumab fairly well.  The patient is here for evaluation prior to starting cycle #8 and to review his recent restaging CT scan results.  MEDICAL HISTORY: Past Medical History:  Diagnosis Date  . Cancer (HImpact    skin cancer  . Constipation    AFTER ANESTHESIA  . Difficulty sleeping   . History of nonmelanoma skin cancer   . Hypertension 10/30/2016  . Inguinal hernia   . Lung mass    right / HAS HAD  RADIATION AND CHEMO FOR LUNG CANCER  . Pneumothorax on right 08/24/2015  . Productive cough    "DUE TO RADIATION"  . Radiation 05/11/15-06/21/15   NSCLCA/right lung  . Smoking 1/2 pack a day or less 09/17/2016  . Spontaneous pneumothorax MANY YRS AGO AND AGAIN 04/04/15   HISTORY OF (ONLY)  . Weight loss 04/01/2017    ALLERGIES:  is allergic to aleve [naproxen].  MEDICATIONS:  Current Outpatient Medications  Medication Sig Dispense Refill  . Arginine (RA L-ARGININE) 1000 MG TABS Take 500 mg by mouth daily.     . Ascorbic Acid (VITAMIN C) 1000 MG tablet Take 1,000 mg by mouth daily. Pt takes 2 5082mtabs    . B Complex Vitamins (B COMPLEX PO) Take 1 capsule by mouth daily.     . Marland KitchenALCIUM CITRATE PO Take 1 tablet by mouth daily.    . Cyanocobalamin (VITAMIN B 12 PO) Take 1 tablet by mouth daily.    . Diphenhyd-Hydrocort-Nystatin (FIRST-DUKES MOUTHWASH) SUSP USE 5 ML TO SWISH AND SWALLOW 3 TIMES A DAY  8  . ferrous sulfate 325 (65 FE) MG tablet Take 325 mg by mouth daily with breakfast.    . guaiFENesin (MUCINEX) 600 MG 12 hr tablet Take 1 tablet (600 mg total) by mouth 2 (two) times daily as needed.    . Marland Kitchenbuprofen (ADVIL,MOTRIN) 200 MG tablet Take 200 mg by mouth 3 (three) times daily.    . magnesium 30 MG tablet Take 30 mg by mouth daily.    . Multiple Vitamin (MULTI VITAMIN DAILY  PO) Take 1 tablet by mouth daily.    . Omega-3 Fatty Acids (FISH OIL) 1000 MG CAPS Take 1,000 mg by mouth daily.    Marland Kitchen saccharomyces boulardii (FLORASTOR) 250 MG capsule Take 250 mg by mouth 2 (two) times daily.    . SYMBICORT 160-4.5 MCG/ACT inhaler INHALE 2 PUFFS TWICE DAILY 10.2 Inhaler 2  . TUDORZA PRESSAIR 400 MCG/ACT AEPB INHALE 1 PUFF INTO THE LUNGS TWICE DAILY 1 each 3  . vitamin A 10000 UNIT capsule 1 capsule with food or milk    . albuterol (PROVENTIL HFA) 108 (90 Base) MCG/ACT inhaler Inhale 2 puffs into the lungs every 6 (six) hours as needed for wheezing or shortness of breath.    . doxycycline  (VIBRA-TABS) 100 MG tablet Take 1 tablet (100 mg total) by mouth 2 (two) times daily. (Patient not taking: Reported on 01/06/2018) 10 tablet 0  . predniSONE (DELTASONE) 10 MG tablet Take 90mx2days,20mgx2days,10mgx2days,5mgx2days,then stop (Patient not taking: Reported on 01/06/2018) 13 tablet 0   No current facility-administered medications for this visit.    Facility-Administered Medications Ordered in Other Visits  Medication Dose Route Frequency Provider Last Rate Last Dose  . nivolumab (OPDIVO) 480 mg in sodium chloride 0.9 % 100 mL chemo infusion  480 mg Intravenous Once MCurt Bears MD   480 mg at 01/06/18 1638    SURGICAL HISTORY:  Past Surgical History:  Procedure Laterality Date  . CHEST TUBE INSERTION Right 08/02/2015   Procedure: INSERTION OF SECOND RIGHT CHEST TUBE WITH FLUROSCOPY;  Surgeon: EGrace Isaac MD;  Location: [facility]  Service: Thoracic;  Laterality: Right;  . CHEST TUBE INSERTION  07/28/2015  . COLONOSCOPY W/ BIOPSIES AND POLYPECTOMY    . HERNIA REPAIR  2011  . INGUINAL HERNIA REPAIR Left 07/15/2015   Procedure: OPEN LEFT INGUINAL HERNIA REPAIR;  Surgeon: MJohnathan Hausen MD;  Location: [facility];  Service: General;  Laterality: Left;  With MESH  . TONSILLECTOMY    . VIDEO BRONCHOSCOPY WITH ENDOBRONCHIAL ULTRASOUND N/A 04/18/2015   Procedure: VIDEO BRONCHOSCOPY WITH ENDOBRONCHIAL ULTRASOUND;  Surgeon: EGrace Isaac MD;  [facility]  Service: Thoracic;  Laterality: N/A;  . VIDEO BRONCHOSCOPY WITH INSERTION OF INTERBRONCHIAL VALVE (IBV) N/A 08/02/2015   Procedure: VIDEO BRONCHOSCOPY WITH ATTEMPTED INSERTION OF INTERBRONCHIAL VALVE (IBV) WITH FLUROSCOPY;  Surgeon: EGrace Isaac MD;  Location: [facility];  Service: Thoracic;  Laterality: N/A;  . WISDOM TOOTH EXTRACTION      REVIEW OF SYSTEMS:   Review of Systems  Constitutional: Negative for appetite change, chills, fatigue, fever and unexpected weight change.  HENT:   Negative for mouth sores, nosebleeds,  sore throat and trouble swallowing.   Eyes: Negative for eye problems and icterus.  Respiratory: Negative for hemoptysis, shortness of breath and wheezing.  Positive for cough. Cardiovascular: Negative for chest pain and leg swelling.  Gastrointestinal: Negative for abdominal pain, constipation, diarrhea, nausea and vomiting.  Genitourinary: Negative for bladder incontinence, difficulty urinating, dysuria, frequency and hematuria.   Musculoskeletal: Negative for back pain, gait problem, neck pain and neck stiffness.  Skin: Negative for itching and rash.  Neurological: Negative for dizziness, extremity weakness, gait problem, headaches, light-headedness and seizures.  Hematological: Negative for adenopathy. Does not bruise/bleed easily.  Psychiatric/Behavioral: Negative for confusion, depression and sleep disturbance. The patient is not nervous/anxious.     PHYSICAL EXAMINATION:  Blood pressure (!) 167/89, pulse 93, temperature (!) 97.5 F (36.4 C), temperature source Oral, resp. rate 18, height _0  (1.778 m), weight 113  lb 12.8 oz (51.6 kg), SpO2 97 %.  ECOG PERFORMANCE STATUS: 1 - Symptomatic but completely ambulatory  Physical Exam  Constitutional: Oriented to person, place, and time and well-developed, well-nourished, and in no distress. No distress.  HENT:  Head: Normocephalic and atraumatic.  Mouth/Throat: Oropharynx is clear and moist. No oropharyngeal exudate.  Eyes: Conjunctivae are normal. Right eye exhibits no discharge. Left eye exhibits no discharge. No scleral icterus.  Neck: Normal range of motion. Neck supple.  Cardiovascular: Normal rate, regular rhythm, normal heart sounds and intact distal pulses.   Pulmonary/Chest: Effort normal and breath sounds normal. No respiratory distress. No wheezes. No rales.  Abdominal: Soft. Bowel sounds are normal. Exhibits no distension and no mass. There is no tenderness.  Musculoskeletal: Normal range of motion. Exhibits no edema.   Lymphadenopathy:    No cervical adenopathy.  Neurological: Alert and oriented to person, place, and time. Exhibits normal muscle tone. Gait normal. Coordination normal.  Skin: Skin is warm and dry. No rash noted. Not diaphoretic. No erythema. No pallor.  Psychiatric: Mood, memory and judgment normal.  Vitals reviewed.  LABORATORY DATA: Lab Results  Component Value Date   WBC 8.9 01/06/2018   HGB 13.0 01/06/2018   HCT 39.4 01/06/2018   MCV 89.1 01/06/2018   PLT 278 01/06/2018      Chemistry      Component Value Date/Time   NA 138 01/06/2018 1436   NA 139 10/14/2017 1523   K 4.0 01/06/2018 1436   K 3.9 10/14/2017 1523   CL 104 01/06/2018 1436   CO2 24 01/06/2018 1436   CO2 24 10/14/2017 1523   BUN 20 01/06/2018 1436   BUN 27.2 (H) 10/14/2017 1523   CREATININE 0.82 01/06/2018 1436   CREATININE 0.9 10/14/2017 1523      Component Value Date/Time   CALCIUM 9.8 01/06/2018 1436   CALCIUM 9.5 10/14/2017 1523   ALKPHOS 119 01/06/2018 1436   ALKPHOS 111 10/14/2017 1523   AST 20 01/06/2018 1436   AST 21 10/14/2017 1523   ALT 18 01/06/2018 1436   ALT 20 10/14/2017 1523   BILITOT 0.3 01/06/2018 1436   BILITOT 0.26 10/14/2017 1523       RADIOGRAPHIC STUDIES:  Ct Chest W Contrast  Result Date: 01/04/2018 CLINICAL DATA:  Metastatic non-small-cell lung cancer. Adenocarcinoma. Diagnosed in July 2016, stage IIIA. Currently on immunotherapy. EXAM: CT CHEST, ABDOMEN, AND PELVIS WITH CONTRAST TECHNIQUE: Multidetector CT imaging of the chest, abdomen and pelvis was performed following the standard protocol during bolus administration of intravenous contrast. CONTRAST:  168m ISOVUE-300 IOPAMIDOL (ISOVUE-300) INJECTION 61% COMPARISON:  10/11/2017 FINDINGS: CT CHEST FINDINGS Cardiovascular: Aortic and branch vessel atherosclerosis. Tortuous thoracic aorta. Normal heart size, without pericardial effusion. Lipomatous hypertrophy of the interatrial septum. Multivessel coronary artery  atherosclerosis. No central pulmonary embolism, on this non-dedicated study. Mediastinum/Nodes: No supraclavicular adenopathy. right paratracheal node of 10 mm on image 23/2, similar. No left hilar adenopathy. Lungs/Pleura: Mild right-sided pleural thickening and irregularity are similar. Moderate centrilobular emphysema. Right upper lobe consolidation with a more masslike morphology today. Decreased air bronchograms, especially posteriorly including on image 24/2. No gross chest wall invasion. right lower lobe clustered nodules. Index 10 mm nodule on image 105/4 is enlarged from 8 mm on the prior. A more medial right lower lobe nodule measures 8 mm on image 114/4 versus 6 mm on the prior. Spiculated mixed attenuation left lower lobe nodule measures 1.9 x 1.9 cm on image 135/4. Compare 1.8 x 1.9 cm on the  prior. Spiculated superior segment left lower lobe nodule measures 8 mm on image 61/4 versus 7 mm on the prior. Pleural-based left apical nodule measures 1.5 cm on image 23/4 versus 1.2 cm on the prior. Musculoskeletal: Right paravertebral mass measures 2.4 x 3.0 cm on image 50/2 versus 2.8 x 2.5 cm on the prior exam (when remeasured). Felt to be similar. Mild superior endplate compression deformity at T8 is not significantly changed. A right-sided T11 mixed lucent and sclerotic lesion is not significantly changed. S-shaped thoracolumbar spine curvature. CT ABDOMEN PELVIS FINDINGS Hepatobiliary: Normal liver. Normal gallbladder, without biliary ductal dilatation. Pancreas: Normal, without mass or ductal dilatation. Spleen: Normal in size, without focal abnormality. Adrenals/Urinary Tract: Low-density right adrenal nodule of 2.0 cm is similar. Normal left adrenal gland. Normal kidneys, without hydronephrosis. Normal urinary bladder. Stomach/Bowel: Normal stomach, without wall thickening. Large amount of stool in the rectum and throughout the remainder of the colon. Normal small bowel. Vascular/Lymphatic: Advanced  aortic and branch vessel atherosclerosis. Nonaneurysmal dilatation of the infrarenal aorta at 2.3 cm. Left periaortic node of 11 mm on image 66/2 versus 9 mm on the prior. Suspect tumor along the right hemidiaphragm medially, including on image 57/2. This is unchanged. No pelvic sidewall adenopathy. Reproductive: Prostatomegaly with median lobe impression into the urinary bladder. Other: No significant free fluid. Musculoskeletal: Scattered sclerotic lesions, superimposed upon osteopenia. Example sclerotic lesion in the left sacrum at 2.0 cm, similar. IMPRESSION: CT CHEST IMPRESSION 1. Increased disease within the lungs. The right upper lobe consolidation is more masslike today, with decreased air bronchograms. In addition, many of the bilateral pulmonary nodules have undergone mild interval enlargement. 2. Similar right paravertebral metastasis. 3. Similar thoracic adenopathy. 4. Coronary artery atherosclerosis. Aortic Atherosclerosis (ICD10-I70.0). CT ABDOMEN AND PELVIS IMPRESSION 1. Suspect developing retroperitoneal adenopathy indicative of nodal metastasis. 2. Similar osseous metastasis. 3.  Possible constipation. 4. Right adrenal adenoma. Electronically Signed   By: Abigail Miyamoto M.D.   On: 01/04/2018 11:49   Ct Abdomen Pelvis W Contrast  Result Date: 01/04/2018 CLINICAL DATA:  Metastatic non-small-cell lung cancer. Adenocarcinoma. Diagnosed in July 2016, stage IIIA. Currently on immunotherapy. EXAM: CT CHEST, ABDOMEN, AND PELVIS WITH CONTRAST TECHNIQUE: Multidetector CT imaging of the chest, abdomen and pelvis was performed following the standard protocol during bolus administration of intravenous contrast. CONTRAST:  122m ISOVUE-300 IOPAMIDOL (ISOVUE-300) INJECTION 61% COMPARISON:  10/11/2017 FINDINGS: CT CHEST FINDINGS Cardiovascular: Aortic and branch vessel atherosclerosis. Tortuous thoracic aorta. Normal heart size, without pericardial effusion. Lipomatous hypertrophy of the interatrial septum.  Multivessel coronary artery atherosclerosis. No central pulmonary embolism, on this non-dedicated study. Mediastinum/Nodes: No supraclavicular adenopathy. right paratracheal node of 10 mm on image 23/2, similar. No left hilar adenopathy. Lungs/Pleura: Mild right-sided pleural thickening and irregularity are similar. Moderate centrilobular emphysema. Right upper lobe consolidation with a more masslike morphology today. Decreased air bronchograms, especially posteriorly including on image 24/2. No gross chest wall invasion. right lower lobe clustered nodules. Index 10 mm nodule on image 105/4 is enlarged from 8 mm on the prior. A more medial right lower lobe nodule measures 8 mm on image 114/4 versus 6 mm on the prior. Spiculated mixed attenuation left lower lobe nodule measures 1.9 x 1.9 cm on image 135/4. Compare 1.8 x 1.9 cm on the prior. Spiculated superior segment left lower lobe nodule measures 8 mm on image 61/4 versus 7 mm on the prior. Pleural-based left apical nodule measures 1.5 cm on image 23/4 versus 1.2 cm on the prior. Musculoskeletal: Right paravertebral mass measures  2.4 x 3.0 cm on image 50/2 versus 2.8 x 2.5 cm on the prior exam (when remeasured). Felt to be similar. Mild superior endplate compression deformity at T8 is not significantly changed. A right-sided T11 mixed lucent and sclerotic lesion is not significantly changed. S-shaped thoracolumbar spine curvature. CT ABDOMEN PELVIS FINDINGS Hepatobiliary: Normal liver. Normal gallbladder, without biliary ductal dilatation. Pancreas: Normal, without mass or ductal dilatation. Spleen: Normal in size, without focal abnormality. Adrenals/Urinary Tract: Low-density right adrenal nodule of 2.0 cm is similar. Normal left adrenal gland. Normal kidneys, without hydronephrosis. Normal urinary bladder. Stomach/Bowel: Normal stomach, without wall thickening. Large amount of stool in the rectum and throughout the remainder of the colon. Normal small bowel.  Vascular/Lymphatic: Advanced aortic and branch vessel atherosclerosis. Nonaneurysmal dilatation of the infrarenal aorta at 2.3 cm. Left periaortic node of 11 mm on image 66/2 versus 9 mm on the prior. Suspect tumor along the right hemidiaphragm medially, including on image 57/2. This is unchanged. No pelvic sidewall adenopathy. Reproductive: Prostatomegaly with median lobe impression into the urinary bladder. Other: No significant free fluid. Musculoskeletal: Scattered sclerotic lesions, superimposed upon osteopenia. Example sclerotic lesion in the left sacrum at 2.0 cm, similar. IMPRESSION: CT CHEST IMPRESSION 1. Increased disease within the lungs. The right upper lobe consolidation is more masslike today, with decreased air bronchograms. In addition, many of the bilateral pulmonary nodules have undergone mild interval enlargement. 2. Similar right paravertebral metastasis. 3. Similar thoracic adenopathy. 4. Coronary artery atherosclerosis. Aortic Atherosclerosis (ICD10-I70.0). CT ABDOMEN AND PELVIS IMPRESSION 1. Suspect developing retroperitoneal adenopathy indicative of nodal metastasis. 2. Similar osseous metastasis. 3.  Possible constipation. 4. Right adrenal adenoma. Electronically Signed   By: Abigail Miyamoto M.D.   On: 01/04/2018 11:49     ASSESSMENT/PLAN:  Malignant neoplasm of upper lobe of right lung [facility]) This is a very pleasant 77 year old white male with metastatic non-small cell lung cancer, adenocarcinoma status post short course of concurrent chemoradiation with weekly carboplatin and paclitaxel followed by 1 cycle of consolidation chemotherapy discontinued secondary to intolerance. He was a started on treatment with immunotherapy with Nivolumab 240 mg IV every 2 weeks status post 35 cycles.  He tolerated the previous treatment well with no significant adverse effects. His treatment was switched to a Nivolumab 480 mg IV every 4 weeks status post 7 cycles.   He continues to tolerate this  treatment fairly well with no concerning complaints. He had recent restaging CT scans and is here to discuss the results.  The patient was seen with Dr. Julien Nordmann.  CT scan results and images were shown to the patient and his family members.  CT scan showed a very slight increase in some of his lung nodules of about 1-2 mm.  Some of this increase could be related to recent infection.  Discussed with the family that this is not show significant progression of his disease.  Treatment options were discussed with the patient and his family including continuing on Nivolumab 480 mg IV every 4 weeks versus changing to chemotherapy versus palliative care/hospice.  The patient would like to continue on the current course of treatment with Nivolumab every 4 weeks.  The patient will proceed with cycle #8 as scheduled today.  He will have restaging CT scans in approximately 3 months. Follow-up visit will be in 4 weeks for evaluation prior to cycle #9.  The patient was advised to call immediately if he has any concerning symptoms in the interval. The patient voices understanding of current disease  status and treatment options and is in agreement with the current care plan. All questions were answered. The patient knows to call the clinic with any problems, questions or concerns. We can certainly see the patient much sooner if necessary.  No orders of the defined types were placed in this encounter.  Gabriel Bussing, DNP, AGPCNP-BC, AOCNP 01/06/18  ADDENDUM: Hematology/Oncology Attending: I had a face-to-face encounter with the patient.  I recommended his care plan.  This is a very pleasant 77 years old white male with metastatic non-small cell lung cancer, adenocarcinoma.  He has been on treatment with Nivolumab for several months initially every 2 weeks for a total of 35 cycles and the patient has been on the Nivolumab 480 mg IV every 4 weeks status post 7 cycles and has been tolerating the treatment well with no  concerning complaints.  There was treated a few weeks ago for questionable bronchitis with a course of antibiotics. He had repeat CT scan of the chest, abdomen and pelvis performed recently.  I personally and independently reviewed the scan images and discussed the results with the patient and his family.  Alough the impression of the scan described disease progression, reviewing the scan and images with the patient and his family showed very mild increase in the size of some of the pulmonary nodules and retroperitoneal lymph node. We discussed with the patient several options for management of his condition including continuation of his treatment with Nivolumab with the same dose and schedule versus consideration of his switching treatment to systemic chemotherapy versus palliative care and hospice referral. After discussion of these options, the patient and his family would like to continue his current treatment with Nivolumab for now. They have the understanding that his disease may continue to increase in size with this treatment but as long as the progression is mild they would consider continuing with this treatment. He will proceed with cycle #8 today.  I will see him back for follow-up visit in 4 weeks for evaluation with the start of cycle #9. The patient was advised to call immediately if he has any concerning symptoms in the interval.  Disclaimer: This note was dictated with voice recognition software. Similar sounding words can inadvertently be transcribed and may be missed upon review. Eilleen Kempf, MD 01/07/18

## 2018-01-07 ENCOUNTER — Telehealth: Payer: Self-pay | Admitting: Oncology

## 2018-01-07 LAB — TSH: TSH: 0.794 u[IU]/mL (ref 0.320–4.118)

## 2018-01-07 NOTE — Telephone Encounter (Signed)
Scheduled appt per 3/11 los - patient to get an updated schedule next visit.

## 2018-02-03 ENCOUNTER — Inpatient Hospital Stay: Payer: 59 | Attending: Oncology

## 2018-02-03 ENCOUNTER — Encounter: Payer: Self-pay | Admitting: Internal Medicine

## 2018-02-03 ENCOUNTER — Other Ambulatory Visit (HOSPITAL_COMMUNITY): Payer: Self-pay | Admitting: Pharmacist

## 2018-02-03 ENCOUNTER — Inpatient Hospital Stay (HOSPITAL_BASED_OUTPATIENT_CLINIC_OR_DEPARTMENT_OTHER): Payer: 59 | Admitting: Internal Medicine

## 2018-02-03 ENCOUNTER — Inpatient Hospital Stay: Payer: 59

## 2018-02-03 VITALS — BP 168/77 | HR 88 | Temp 97.7°F | Resp 20 | Ht 70.0 in | Wt 113.8 lb

## 2018-02-03 DIAGNOSIS — Z79899 Other long term (current) drug therapy: Secondary | ICD-10-CM | POA: Diagnosis not present

## 2018-02-03 DIAGNOSIS — C3411 Malignant neoplasm of upper lobe, right bronchus or lung: Secondary | ICD-10-CM | POA: Diagnosis not present

## 2018-02-03 DIAGNOSIS — M545 Low back pain: Secondary | ICD-10-CM | POA: Diagnosis not present

## 2018-02-03 DIAGNOSIS — Z923 Personal history of irradiation: Secondary | ICD-10-CM | POA: Insufficient documentation

## 2018-02-03 DIAGNOSIS — F17210 Nicotine dependence, cigarettes, uncomplicated: Secondary | ICD-10-CM | POA: Diagnosis not present

## 2018-02-03 DIAGNOSIS — I10 Essential (primary) hypertension: Secondary | ICD-10-CM | POA: Diagnosis not present

## 2018-02-03 DIAGNOSIS — Z5112 Encounter for antineoplastic immunotherapy: Secondary | ICD-10-CM

## 2018-02-03 DIAGNOSIS — Z9221 Personal history of antineoplastic chemotherapy: Secondary | ICD-10-CM | POA: Insufficient documentation

## 2018-02-03 DIAGNOSIS — Z85828 Personal history of other malignant neoplasm of skin: Secondary | ICD-10-CM | POA: Diagnosis not present

## 2018-02-03 DIAGNOSIS — C3491 Malignant neoplasm of unspecified part of right bronchus or lung: Secondary | ICD-10-CM

## 2018-02-03 LAB — CBC WITH DIFFERENTIAL/PLATELET
Basophils Absolute: 0 10*3/uL (ref 0.0–0.1)
Basophils Relative: 1 %
EOS ABS: 0.2 10*3/uL (ref 0.0–0.5)
EOS PCT: 2 %
HCT: 39.4 % (ref 38.4–49.9)
Hemoglobin: 13.1 g/dL (ref 13.0–17.1)
LYMPHS ABS: 0.7 10*3/uL — AB (ref 0.9–3.3)
LYMPHS PCT: 9 %
MCH: 29.2 pg (ref 27.2–33.4)
MCHC: 33.2 g/dL (ref 32.0–36.0)
MCV: 88.2 fL (ref 79.3–98.0)
MONO ABS: 0.5 10*3/uL (ref 0.1–0.9)
Monocytes Relative: 7 %
Neutro Abs: 6.2 10*3/uL (ref 1.5–6.5)
Neutrophils Relative %: 81 %
PLATELETS: 255 10*3/uL (ref 140–400)
RBC: 4.46 MIL/uL (ref 4.20–5.82)
RDW: 14.7 % — AB (ref 11.0–14.6)
WBC: 7.6 10*3/uL (ref 4.0–10.3)

## 2018-02-03 LAB — COMPREHENSIVE METABOLIC PANEL
ALBUMIN: 3.5 g/dL (ref 3.5–5.0)
ALT: 16 U/L (ref 0–55)
AST: 17 U/L (ref 5–34)
Alkaline Phosphatase: 125 U/L (ref 40–150)
Anion gap: 8 (ref 3–11)
BILIRUBIN TOTAL: 0.3 mg/dL (ref 0.2–1.2)
BUN: 21 mg/dL (ref 7–26)
CHLORIDE: 105 mmol/L (ref 98–109)
CO2: 26 mmol/L (ref 22–29)
Calcium: 9.8 mg/dL (ref 8.4–10.4)
Creatinine, Ser: 0.84 mg/dL (ref 0.70–1.30)
GFR calc Af Amer: >60 mL/min (ref >60)
GLUCOSE: 97 mg/dL (ref 70–140)
POTASSIUM: 4 mmol/L (ref 3.5–5.1)
Sodium: 139 mmol/L (ref 136–145)
TOTAL PROTEIN: 7.4 g/dL (ref 6.4–8.3)

## 2018-02-03 MED ORDER — SODIUM CHLORIDE 0.9 % IV SOLN
480.0000 mg | Freq: Once | INTRAVENOUS | Status: AC
  Administered 2018-02-03: 480 mg via INTRAVENOUS
  Filled 2018-02-03: qty 48

## 2018-02-03 MED ORDER — SODIUM CHLORIDE 0.9 % IV SOLN
Freq: Once | INTRAVENOUS | Status: AC
  Administered 2018-02-03: 17:00:00 via INTRAVENOUS

## 2018-02-03 NOTE — Patient Instructions (Signed)
Steps to Quit Smoking Smoking tobacco can be bad for your health. It can also affect almost every organ in your body. Smoking puts you and people around you at risk for many serious long-lasting (chronic) diseases. Quitting smoking is hard, but it is one of the best things that you can do for your health. It is never too late to quit. What are the benefits of quitting smoking? When you quit smoking, you lower your risk for getting serious diseases and conditions. They can include:  Lung cancer or lung disease.  Heart disease.  Stroke.  Heart attack.  Not being able to have children (infertility).  Weak bones (osteoporosis) and broken bones (fractures).  If you have coughing, wheezing, and shortness of breath, those symptoms may get better when you quit. You may also get sick less often. If you are pregnant, quitting smoking can help to lower your chances of having a baby of low birth weight. What can I do to help me quit smoking? Talk with your doctor about what can help you quit smoking. Some things you can do (strategies) include:  Quitting smoking totally, instead of slowly cutting back how much you smoke over a period of time.  Going to in-person counseling. You are more likely to quit if you go to many counseling sessions.  Using resources and support systems, such as: ? Online chats with a counselor. ? Phone quitlines. ? Printed self-help materials. ? Support groups or group counseling. ? Text messaging programs. ? Mobile phone apps or applications.  Taking medicines. Some of these medicines may have nicotine in them. If you are pregnant or breastfeeding, do not take any medicines to quit smoking unless your doctor says it is okay. Talk with your doctor about counseling or other things that can help you.  Talk with your doctor about using more than one strategy at the same time, such as taking medicines while you are also going to in-person counseling. This can help make  quitting easier. What things can I do to make it easier to quit? Quitting smoking might feel very hard at first, but there is a lot that you can do to make it easier. Take these steps:  Talk to your family and friends. Ask them to support and encourage you.  Call phone quitlines, reach out to support groups, or work with a counselor.  Ask people who smoke to not smoke around you.  Avoid places that make you want (trigger) to smoke, such as: ? Bars. ? Parties. ? Smoke-break areas at work.  Spend time with people who do not smoke.  Lower the stress in your life. Stress can make you want to smoke. Try these things to help your stress: ? Getting regular exercise. ? Deep-breathing exercises. ? Yoga. ? Meditating. ? Doing a body scan. To do this, close your eyes, focus on one area of your body at a time from head to toe, and notice which parts of your body are tense. Try to relax the muscles in those areas.  Download or buy apps on your mobile phone or tablet that can help you stick to your quit plan. There are many free apps, such as QuitGuide from the CDC (Centers for Disease Control and Prevention). You can find more support from smokefree.gov and other websites.  This information is not intended to replace advice given to you by your health care provider. Make sure you discuss any questions you have with your health care provider. Document Released: 08/11/2009 Document   Revised: 06/12/2016 Document Reviewed: 03/01/2015 Elsevier Interactive Patient Education  2018 Elsevier Inc.  

## 2018-02-03 NOTE — Progress Notes (Signed)
[facility] [phone]   [phone]) [phone]  OFFICE PROGRESS NOTE  Gabriel Dials, MD [place] [address]  DIAGNOSIS: Metastatic non-small cell lung cancer, adenocarcinoma initially diagnosed as unresectable stage IIIa (T2a, N2, M0) in July 2016.  Foundation One molecular studies reveal that he was positive for [place], [place], and STAG2 S1075f*20. He was negative for: EGFR, ALK, BRAF, MET, RET, ERBB2 and ROS1  PRIOR THERAPY: 1) Concurrent chemoradiation with chemotherapy in the form of weekly carboplatin for AUC 2 paclitaxel 45 mg/m given concurrent with radiation therapy. First cycle started 05/09/2015. Status post 7 cycles. 2) Consolidation chemotherapy with carboplatin for AUC of 5 on day 1 and gemcitabine 1000 MG/M2 on days 1 and 8 every 3 weeks. First dose 08/15/2015. Discontinued secondary to intolerance. 3)  Immunotherapy with Nivolumab 240 mg IV every 2 weeks, status post 35 cycles.  CURRENT THERAPY: Immunotherapy with Nivolumab 480 mg IV every 4 weeks status post 8 cycles.  INTERVAL HISTORY: Gabriel MEUNIER89y.o. male returns to the clinic today for follow-up visit accompanied by his daughter [name]  The patient is feeling fine today with no specific complaints except for the intermittent cough.  He continues to have mild wheezes.  He denied having any current chest pain, shortness of breath or hemoptysis.  He has low back pain that had increased recently.  He takes ibuprofen with improvement of his pain.  He denied having any nausea, vomiting, diarrhea or constipation.  He denied having any skin rash.  He denied having any weight loss or night sweats.  He is here today for evaluation before starting cycle #9 of his treatment.  MEDICAL HISTORY: Past Medical History:  Diagnosis Date  . [facility]    skin cancer  . Constipation    AFTER ANESTHESIA  . Difficulty sleeping   . History of nonmelanoma skin  cancer   . Hypertension 10/30/2016  . Inguinal hernia   . Lung mass    right / HAS HAD RADIATION AND CHEMO FOR LUNG CANCER  . Pneumothorax on right 08/24/2015  . Productive cough    "DUE TO RADIATION"  . Radiation 05/11/15-06/21/15   NSCLCA/right lung  . Smoking 1/2 pack a day or less 09/17/2016  . Spontaneous pneumothorax MANY YRS AGO AND AGAIN 04/04/15   HISTORY OF (ONLY)  . Weight loss 04/01/2017    ALLERGIES:  is allergic to aleve [naproxen].  MEDICATIONS:  Current Outpatient Medications  Medication Sig Dispense Refill  . Arginine (RA L-ARGININE) 1000 MG TABS Take 500 mg by mouth daily.     . Ascorbic Acid (VITAMIN C) 1000 MG tablet Take 1,000 mg by mouth daily. Pt takes 2 5050mtabs    . B Complex Vitamins (B COMPLEX PO) Take 1 capsule by mouth daily.     . Marland KitchenALCIUM CITRATE PO Take 1 tablet by mouth daily.    . Cyanocobalamin (VITAMIN B 12 PO) Take 1 tablet by mouth daily.    . ferrous sulfate 325 (65 FE) MG tablet Take 325 mg by mouth daily with breakfast.    . guaiFENesin (MUCINEX) 600 MG 12 hr tablet Take 1 tablet (600 mg total) by mouth 2 (two) times daily as needed.    . Marland Kitchenbuprofen (ADVIL,MOTRIN) 200 MG tablet Take 200 mg by mouth 3 (three) times daily.    . magnesium 30 MG tablet Take 30 mg by mouth daily.    . Multiple Vitamin (MULTI  VITAMIN DAILY PO) Take 1 tablet by mouth daily.    . Omega-3 Fatty Acids (FISH OIL) 1000 MG CAPS Take 1,000 mg by mouth daily.    Marland Kitchen saccharomyces boulardii (FLORASTOR) 250 MG capsule Take 250 mg by mouth 2 (two) times daily.    . SYMBICORT 160-4.5 MCG/ACT inhaler INHALE 2 PUFFS TWICE DAILY 10.2 Inhaler 2  . vitamin A 10000 UNIT capsule 1 capsule with food or milk    . albuterol (PROVENTIL HFA) 108 (90 Base) MCG/ACT inhaler Inhale 2 puffs into the lungs every 6 (six) hours as needed for wheezing or shortness of breath.    . Diphenhyd-Hydrocort-Nystatin (FIRST-DUKES MOUTHWASH) SUSP USE 5 ML TO SWISH AND SWALLOW 3 TIMES A DAY  8   No current  facility-administered medications for this visit.     SURGICAL HISTORY:  Past Surgical History:  Procedure Laterality Date  . CHEST TUBE INSERTION Right 08/02/2015   Procedure: INSERTION OF SECOND RIGHT CHEST TUBE WITH FLUROSCOPY;  Surgeon: Grace Isaac, MD;  Location: [facility];  Service: Thoracic;  Laterality: Right;  . CHEST TUBE INSERTION  07/28/2015  . COLONOSCOPY W/ BIOPSIES AND POLYPECTOMY    . HERNIA REPAIR  2011  . INGUINAL HERNIA REPAIR Left 07/15/2015   Procedure: OPEN LEFT INGUINAL HERNIA REPAIR;  Surgeon: Johnathan Hausen, MD;  Location[facility];  Service: General;  Laterality: Left;  With MESH  . TONSILLECTOMY    . VIDEO BRONCHOSCOPY WITH ENDOBRONCHIAL ULTRASOUND N/A 04/18/2015   Procedure: VIDEO BRONCHOSCOPY WITH ENDOBRONCHIAL ULTRASOUND;  Surgeon: Grace Isaac, MD;  Location: [facility];  Service: Thoracic;  Laterality: N/A;  . VIDEO BRONCHOSCOPY WITH INSERTION OF INTERBRONCHIAL VALVE (IBV) N/A 08/02/2015   Procedure: VIDEO BRONCHOSCOPY WITH ATTEMPTED INSERTION OF INTERBRONCHIAL VALVE (IBV) WITH FLUROSCOPY;  Surgeon: Grace Isaac, MD;  [facility];  Service: Thoracic;  Laterality: N/A;  . WISDOM TOOTH EXTRACTION      REVIEW OF SYSTEMS:  A comprehensive review of systems was negative except for: Respiratory: positive for cough Musculoskeletal: positive for back pain   PHYSICAL EXAMINATION: General appearance: alert, cooperative and no distress Head: Normocephalic, without obvious abnormality, atraumatic Neck: no adenopathy, no JVD, supple, symmetrical, trachea midline and thyroid not enlarged, symmetric, no tenderness/mass/nodules Lymph nodes: Cervical, supraclavicular, and axillary nodes normal. Resp: wheezes bilaterally Back: symmetric, no curvature. ROM normal. No CVA tenderness. Cardio: regular rate and rhythm, S1, S2 normal, no murmur, click, rub or gallop GI: soft, non-tender; bowel sounds normal; no masses,  no organomegaly Extremities: extremities normal,  atraumatic, no cyanosis or edema  ECOG PERFORMANCE STATUS: 1 - Symptomatic but completely ambulatory  Blood pressure (!) 168/77, pulse 88, temperature 97.7 F (36.5 C), temperature source Oral, resp. rate 20, height 5' 10"  (1.778 m), weight 113 lb 12.8 oz (51.6 kg), SpO2 97 %.  LABORATORY DATA: Lab Results  Component Value Date   WBC 7.6 02/03/2018   HGB 13.1 02/03/2018   HCT 39.4 02/03/2018   MCV 88.2 02/03/2018   PLT 255 02/03/2018      Chemistry      Component Value Date/Time   NA 138 01/06/2018 1436   NA 139 10/14/2017 1523   K 4.0 01/06/2018 1436   K 3.9 10/14/2017 1523   CL 104 01/06/2018 1436   CO2 24 01/06/2018 1436   CO2 24 10/14/2017 1523   BUN 20 01/06/2018 1436   BUN 27.2 (H) 10/14/2017 1523   CREATININE 0.82 01/06/2018 1436   CREATININE 0.9 10/14/2017 1523  Component Value Date/Time   CALCIUM 9.8 01/06/2018 1436   CALCIUM 9.5 10/14/2017 1523   ALKPHOS 119 01/06/2018 1436   ALKPHOS 111 10/14/2017 1523   AST 20 01/06/2018 1436   AST 21 10/14/2017 1523   ALT 18 01/06/2018 1436   ALT 20 10/14/2017 1523   BILITOT 0.3 01/06/2018 1436   BILITOT 0.26 10/14/2017 1523       RADIOGRAPHIC STUDIES: No results found.  ASSESSMENT AND PLAN: This is a very pleasant 77 years old white male with metastatic non-small cell lung cancer, adenocarcinoma status post short course of concurrent chemoradiation with weekly carboplatin and paclitaxel followed by 1 cycle of consolidation chemotherapy discontinued secondary to intolerance. He was a started on treatment with immunotherapy with Nivolumab 240 mg IV every 2 weeks status post 35 cycles.  He tolerated the previous treatment well with no significant adverse effects. His treatment was switched to a Nivolumab 480 mg IV every 4 weeks status post 8 cycles.   The patient tolerated the last cycle of his treatment well. I recommended for him to proceed with cycle #9 today as a scheduled.  I will see him back for follow-up  visit in 4 weeks for evaluation before starting cycle #10. For hypertension, he will continue with the current blood pressure medication and monitor his blood pressure closely at home. For the back pain, he will continue on ibuprofen for now. The patient was advised to call immediately if he has any concerning symptoms in the interval. The patient voices understanding of current disease status and treatment options and is in agreement with the current care plan. All questions were answered. The patient knows to call the clinic with any problems, questions or concerns. We can certainly see the patient much sooner if necessary.  Disclaimer: This note was dictated with voice recognition software. Similar sounding words can inadvertently be transcribed and may not be corrected upon review.

## 2018-02-03 NOTE — Patient Instructions (Signed)
[facility] Discharge Instructions for Patients Receiving Chemotherapy  Today you received the following chemotherapy agents: Nivolumab  To help prevent nausea and vomiting after your treatment, we encourage you to take your nausea medication as directed.    If you develop nausea and vomiting that is not controlled by your nausea medication, call the clinic.   BELOW ARE SYMPTOMS THAT SHOULD BE REPORTED IMMEDIATELY:  *FEVER GREATER THAN 100.5 F  *CHILLS WITH OR WITHOUT FEVER  NAUSEA AND VOMITING THAT IS NOT CONTROLLED WITH YOUR NAUSEA MEDICATION  *UNUSUAL SHORTNESS OF BREATH  *UNUSUAL BRUISING OR BLEEDING  TENDERNESS IN MOUTH AND THROAT WITH OR WITHOUT PRESENCE OF ULCERS  *URINARY PROBLEMS  *BOWEL PROBLEMS  UNUSUAL RASH Items with * indicate a potential emergency and should be followed up as soon as possible.  Feel free to call the clinic should you have any questions or concerns. The clinic phone number is [phone].  Please show the CHEMO ALERT CARD at check-in to the Emergency Department and triage nurse.   

## 2018-02-04 ENCOUNTER — Telehealth: Payer: Self-pay | Admitting: Internal Medicine

## 2018-02-04 LAB — TSH: TSH: 0.963 u[IU]/mL (ref 0.320–4.118)

## 2018-02-04 NOTE — Telephone Encounter (Signed)
Scheduled appt per 4/8 los - patient to get an updated schedule next visit.

## 2018-02-13 ENCOUNTER — Telehealth: Payer: Self-pay | Admitting: Internal Medicine

## 2018-02-13 NOTE — Telephone Encounter (Signed)
I was working recall list & had call pt to make follow up appt.  Called & spoke to pt.  Appt made.  Nothing further needed.

## 2018-03-03 ENCOUNTER — Inpatient Hospital Stay: Payer: 59

## 2018-03-03 ENCOUNTER — Encounter: Payer: Self-pay | Admitting: Internal Medicine

## 2018-03-03 ENCOUNTER — Ambulatory Visit (HOSPITAL_COMMUNITY)
Admission: RE | Admit: 2018-03-03 | Discharge: 2018-03-03 | Disposition: A | Discharge status: Home / Self Care | Payer: 59 | Source: Ambulatory Visit | Attending: Internal Medicine | Admitting: Internal Medicine

## 2018-03-03 ENCOUNTER — Inpatient Hospital Stay (HOSPITAL_BASED_OUTPATIENT_CLINIC_OR_DEPARTMENT_OTHER): Payer: 59 | Admitting: Internal Medicine

## 2018-03-03 ENCOUNTER — Telehealth: Payer: Self-pay | Admitting: Internal Medicine

## 2018-03-03 ENCOUNTER — Inpatient Hospital Stay: Payer: 59 | Attending: Oncology

## 2018-03-03 VITALS — BP 157/92 | HR 87 | Temp 97.5°F | Resp 18 | Ht 70.0 in | Wt 110.5 lb

## 2018-03-03 DIAGNOSIS — R918 Other nonspecific abnormal finding of lung field: Secondary | ICD-10-CM | POA: Insufficient documentation

## 2018-03-03 DIAGNOSIS — Z8781 Personal history of (healed) traumatic fracture: Secondary | ICD-10-CM

## 2018-03-03 DIAGNOSIS — Z79899 Other long term (current) drug therapy: Secondary | ICD-10-CM | POA: Insufficient documentation

## 2018-03-03 DIAGNOSIS — R0789 Other chest pain: Secondary | ICD-10-CM | POA: Insufficient documentation

## 2018-03-03 DIAGNOSIS — R197 Diarrhea, unspecified: Secondary | ICD-10-CM | POA: Insufficient documentation

## 2018-03-03 DIAGNOSIS — C3411 Malignant neoplasm of upper lobe, right bronchus or lung: Secondary | ICD-10-CM | POA: Diagnosis not present

## 2018-03-03 DIAGNOSIS — Z85828 Personal history of other malignant neoplasm of skin: Secondary | ICD-10-CM

## 2018-03-03 DIAGNOSIS — Z923 Personal history of irradiation: Secondary | ICD-10-CM

## 2018-03-03 DIAGNOSIS — F17210 Nicotine dependence, cigarettes, uncomplicated: Secondary | ICD-10-CM | POA: Diagnosis not present

## 2018-03-03 DIAGNOSIS — C3491 Malignant neoplasm of unspecified part of right bronchus or lung: Secondary | ICD-10-CM

## 2018-03-03 DIAGNOSIS — R634 Abnormal weight loss: Secondary | ICD-10-CM

## 2018-03-03 DIAGNOSIS — Z5112 Encounter for antineoplastic immunotherapy: Secondary | ICD-10-CM

## 2018-03-03 DIAGNOSIS — Z9221 Personal history of antineoplastic chemotherapy: Secondary | ICD-10-CM | POA: Diagnosis not present

## 2018-03-03 DIAGNOSIS — I10 Essential (primary) hypertension: Secondary | ICD-10-CM | POA: Insufficient documentation

## 2018-03-03 DIAGNOSIS — K5900 Constipation, unspecified: Secondary | ICD-10-CM | POA: Insufficient documentation

## 2018-03-03 DIAGNOSIS — R5383 Other fatigue: Secondary | ICD-10-CM

## 2018-03-03 LAB — CBC WITH DIFFERENTIAL/PLATELET
BASOS PCT: 1 %
Basophils Absolute: 0 10*3/uL (ref 0.0–0.1)
Eosinophils Absolute: 0.1 10*3/uL (ref 0.0–0.5)
Eosinophils Relative: 2 %
HEMATOCRIT: 43.4 % (ref 38.4–49.9)
Hemoglobin: 14.2 g/dL (ref 13.0–17.1)
LYMPHS PCT: 9 %
Lymphs Abs: 0.8 10*3/uL — ABNORMAL LOW (ref 0.9–3.3)
MCH: 29.1 pg (ref 27.2–33.4)
MCHC: 32.6 g/dL (ref 32.0–36.0)
MCV: 89.1 fL (ref 79.3–98.0)
MONO ABS: 0.6 10*3/uL (ref 0.1–0.9)
MONOS PCT: 7 %
NEUTROS ABS: 7.1 10*3/uL — AB (ref 1.5–6.5)
Neutrophils Relative %: 81 %
Platelets: 275 10*3/uL (ref 140–400)
RBC: 4.87 MIL/uL (ref 4.20–5.82)
RDW: 14.8 % — AB (ref 11.0–14.6)
WBC: 8.6 10*3/uL (ref 4.0–10.3)

## 2018-03-03 LAB — COMPREHENSIVE METABOLIC PANEL
ALBUMIN: 3.8 g/dL (ref 3.5–5.0)
ALT: 18 U/L (ref 0–55)
ANION GAP: 8 (ref 3–11)
AST: 21 U/L (ref 5–34)
Alkaline Phosphatase: 122 U/L (ref 40–150)
BUN: 29 mg/dL — ABNORMAL HIGH (ref 7–26)
CALCIUM: 10 mg/dL (ref 8.4–10.4)
CO2: 26 mmol/L (ref 22–29)
Chloride: 104 mmol/L (ref 98–109)
Creatinine, Ser: 1.01 mg/dL (ref 0.70–1.30)
GFR calc Af Amer: >60 mL/min (ref >60)
GFR calc non Af Amer: >60 mL/min (ref >60)
GLUCOSE: 100 mg/dL (ref 70–140)
Potassium: 3.9 mmol/L (ref 3.5–5.1)
SODIUM: 138 mmol/L (ref 136–145)
Total Bilirubin: 0.2 mg/dL (ref 0.2–1.2)
Total Protein: 8.1 g/dL (ref 6.4–8.3)

## 2018-03-03 MED ORDER — SODIUM CHLORIDE 0.9 % IV SOLN
480.0000 mg | Freq: Once | INTRAVENOUS | Status: AC
  Administered 2018-03-03: 480 mg via INTRAVENOUS
  Filled 2018-03-03: qty 48

## 2018-03-03 MED ORDER — SODIUM CHLORIDE 0.9 % IV SOLN
Freq: Once | INTRAVENOUS | Status: AC
  Administered 2018-03-03: 17:00:00 via INTRAVENOUS

## 2018-03-03 MED ORDER — DRONABINOL 2.5 MG PO CAPS: 2.5000 mg | ORAL_CAPSULE | Freq: Two times a day (BID) | ORAL | 0 refills | Status: AC

## 2018-03-03 NOTE — Telephone Encounter (Signed)
Scheduled appt per 5/6 los - pt to get an updated schedule in the treatment area.

## 2018-03-03 NOTE — Patient Instructions (Signed)
[facility] Discharge Instructions for Patients Receiving Chemotherapy  Today you received the following chemotherapy agents: Nivolumab  To help prevent nausea and vomiting after your treatment, we encourage you to take your nausea medication as directed.    If you develop nausea and vomiting that is not controlled by your nausea medication, call the clinic.   BELOW ARE SYMPTOMS THAT SHOULD BE REPORTED IMMEDIATELY:  *FEVER GREATER THAN 100.5 F  *CHILLS WITH OR WITHOUT FEVER  NAUSEA AND VOMITING THAT IS NOT CONTROLLED WITH YOUR NAUSEA MEDICATION  *UNUSUAL SHORTNESS OF BREATH  *UNUSUAL BRUISING OR BLEEDING  TENDERNESS IN MOUTH AND THROAT WITH OR WITHOUT PRESENCE OF ULCERS  *URINARY PROBLEMS  *BOWEL PROBLEMS  UNUSUAL RASH Items with * indicate a potential emergency and should be followed up as soon as possible.  Feel free to call the clinic should you have any questions or concerns. The clinic phone number is [phone].  Please show the CHEMO ALERT CARD at check-in to the Emergency Department and triage nurse.   

## 2018-03-03 NOTE — Progress Notes (Signed)
[facility] [phone]   Fax:([phone]  OFFICE PROGRESS NOTE  Gabriel Dials, MD [place] [address]  DIAGNOSIS: Metastatic non-small cell lung cancer, adenocarcinoma initially diagnosed as unresectable stage IIIa (T2a, N2, M0) in July 2016.  Foundation One molecular studies reveal that he was positive for [place], [place], and STAG2 S1087f*20. He was negative for: EGFR, ALK, BRAF, MET, RET, ERBB2 and ROS1  PRIOR THERAPY: 1) Concurrent chemoradiation with chemotherapy in the form of weekly carboplatin for AUC 2 paclitaxel 45 mg/m given concurrent with radiation therapy. First cycle started 05/09/2015. Status post 7 cycles. 2) Consolidation chemotherapy with carboplatin for AUC of 5 on day 1 and gemcitabine 1000 MG/M2 on days 1 and 8 every 3 weeks. First dose 08/15/2015. Discontinued secondary to intolerance. 3)  Immunotherapy with Nivolumab 240 mg IV every 2 weeks, status post 35 cycles.  CURRENT THERAPY: Immunotherapy with Nivolumab 480 mg IV every 4 weeks status post 9 cycles.  INTERVAL HISTORY: Gabriel SKORUPSKI77y.o. male returns to the clinic today for follow-up visit accompanied by [name]  The patient is feeling fine today with no specific complaints except for pain on the left side of the chest.  He is concerned about rib fracture after an episode of cough.  He denied having any shortness of breath except with exertion but he continues to have wheezing.  He denied having any fever or chills.  He has no nausea, vomiting, diarrhea or constipation.  He has lack of appetite and he lost few pounds recently.  He is here today for evaluation before starting cycle #10 of his immunotherapy with Nivolumab.  MEDICAL HISTORY: Past Medical History:  Diagnosis Date  . [place] [employer]    [place] cancer  . Constipation    AFTER ANESTHESIA  . Difficulty sleeping   . History of nonmelanoma skin cancer   . Hypertension  10/30/2016  . Inguinal hernia   . Lung mass    right / HAS HAD RADIATION AND CHEMO FOR LUNG CANCER  . Pneumothorax on right 08/24/2015  . Productive cough    "DUE TO RADIATION"  . Radiation 05/11/15-06/21/15   NSCLCA/right lung  . Smoking 1/2 pack a day or less 09/17/2016  . Spontaneous pneumothorax MANY YRS AGO AND AGAIN 04/04/15   HISTORY OF (ONLY)  . Weight loss 04/01/2017    ALLERGIES:  is allergic to aleve [naproxen].  MEDICATIONS:  Current Outpatient Medications  Medication Sig Dispense Refill  . albuterol (PROVENTIL HFA) 108 (90 Base) MCG/ACT inhaler Inhale 2 puffs into the lungs every 6 (six) hours as needed for wheezing or shortness of breath.    . Arginine (RA L-ARGININE) 1000 MG TABS Take 500 mg by mouth daily.     . Ascorbic Acid (VITAMIN C) 1000 MG tablet Take 1,000 mg by mouth daily. Pt takes 2 5033mtabs    . B Complex Vitamins (B COMPLEX PO) Take 1 capsule by mouth daily.     . Marland KitchenALCIUM CITRATE PO Take 1 tablet by mouth daily.    . Cyanocobalamin (VITAMIN B 12 PO) Take 1 tablet by mouth daily.    . Diphenhyd-Hydrocort-Nystatin (FIRST-DUKES MOUTHWASH) SUSP USE 5 ML TO SWISH AND SWALLOW 3 TIMES A DAY  8  . ferrous sulfate 325 (65 FE) MG tablet Take 325 mg by mouth daily with breakfast.    . guaiFENesin (MUCINEX) 600 MG 12 hr tablet Take 1 tablet (600 mg  total) by mouth 2 (two) times daily as needed.    Marland Kitchen ibuprofen (ADVIL,MOTRIN) 200 MG tablet Take 200 mg by mouth 3 (three) times daily.    . magnesium 30 MG tablet Take 30 mg by mouth daily.    . Multiple Vitamin (MULTI VITAMIN DAILY PO) Take 1 tablet by mouth daily.    . Omega-3 Fatty Acids (FISH OIL) 1000 MG CAPS Take 1,000 mg by mouth daily.    Marland Kitchen saccharomyces boulardii (FLORASTOR) 250 MG capsule Take 250 mg by mouth 2 (two) times daily.    . SYMBICORT 160-4.5 MCG/ACT inhaler INHALE 2 PUFFS TWICE DAILY 10.2 Inhaler 2  . vitamin A 10000 UNIT capsule 1 capsule with food or milk     No current facility-administered medications  for this visit.     SURGICAL HISTORY:  Past Surgical History:  Procedure Laterality Date  . CHEST TUBE INSERTION Right 08/02/2015   Procedure: INSERTION OF SECOND RIGHT CHEST TUBE WITH FLUROSCOPY;  Surgeon: Grace Isaac, MD;  Location: [facility];  Service: Thoracic;  Laterality: Right;  . CHEST TUBE INSERTION  07/28/2015  . COLONOSCOPY W/ BIOPSIES AND POLYPECTOMY    . HERNIA REPAIR  2011  . INGUINAL HERNIA REPAIR Left 07/15/2015   Procedure: OPEN LEFT INGUINAL HERNIA REPAIR;  Surgeon: Johnathan Hausen, MD;  [facility];  Service: General;  Laterality: Left;  With MESH  . TONSILLECTOMY    . VIDEO BRONCHOSCOPY WITH ENDOBRONCHIAL ULTRASOUND N/A 04/18/2015   Procedure: VIDEO BRONCHOSCOPY WITH ENDOBRONCHIAL ULTRASOUND;  Surgeon: Grace Isaac, MD;  Location: [facility];  Service: Thoracic;  Laterality: N/A;  . VIDEO BRONCHOSCOPY WITH INSERTION OF INTERBRONCHIAL VALVE (IBV) N/A 08/02/2015   Procedure: VIDEO BRONCHOSCOPY WITH ATTEMPTED INSERTION OF INTERBRONCHIAL VALVE (IBV) WITH FLUROSCOPY;  Surgeon: Grace Isaac, MD;  Location[facility];  Service: Thoracic;  Laterality: N/A;  . WISDOM TOOTH EXTRACTION      REVIEW OF SYSTEMS:  Constitutional: positive for anorexia, fatigue and weight loss Eyes: negative Ears, nose, mouth, throat, and face: negative Respiratory: positive for pleurisy/chest pain Cardiovascular: negative Gastrointestinal: negative Genitourinary:negative Integument/breast: negative Hematologic/lymphatic: negative Musculoskeletal:negative Neurological: negative Behavioral/Psych: negative Endocrine: negative Allergic/Immunologic: negative   PHYSICAL EXAMINATION: General appearance: alert, cooperative, fatigued and no distress Head: Normocephalic, without obvious abnormality, atraumatic Neck: no adenopathy, no JVD, supple, symmetrical, trachea midline and thyroid not enlarged, symmetric, no tenderness/mass/nodules Lymph nodes: Cervical, supraclavicular, and axillary nodes  normal. Resp: wheezes bilaterally Back: symmetric, no curvature. ROM normal. No CVA tenderness. Cardio: regular rate and rhythm, S1, S2 normal, no murmur, click, rub or gallop GI: soft, non-tender; bowel sounds normal; no masses,  no organomegaly Extremities: extremities normal, atraumatic, no cyanosis or edema Neurologic: Alert and oriented X 3, normal strength and tone. Normal symmetric reflexes. Normal coordination and gait  ECOG PERFORMANCE STATUS: 1 - Symptomatic but completely ambulatory  Blood pressure (!) 157/92, pulse 87, temperature (!) 97.5 F (36.4 C), temperature source Oral, resp. rate 18, height _0  (1.778 m), weight 110 lb 8 oz (50.1 kg), SpO2 99 %.  LABORATORY DATA: Lab Results  Component Value Date   WBC 8.6 03/03/2018   HGB 14.2 03/03/2018   HCT 43.4 03/03/2018   MCV 89.1 03/03/2018   PLT 275 03/03/2018      Chemistry      Component Value Date/Time   NA 139 02/03/2018 1508   NA 139 10/14/2017 1523   K 4.0 02/03/2018 1508   K 3.9 10/14/2017 1523   CL 105 02/03/2018 1508  CO2 26 02/03/2018 1508   CO2 24 10/14/2017 1523   BUN 21 02/03/2018 1508   BUN 27.2 (H) 10/14/2017 1523   CREATININE 0.84 02/03/2018 1508   CREATININE 0.9 10/14/2017 1523      Component Value Date/Time   CALCIUM 9.8 02/03/2018 1508   CALCIUM 9.5 10/14/2017 1523   ALKPHOS 125 02/03/2018 1508   ALKPHOS 111 10/14/2017 1523   AST 17 02/03/2018 1508   AST 21 10/14/2017 1523   ALT 16 02/03/2018 1508   ALT 20 10/14/2017 1523   BILITOT 0.3 02/03/2018 1508   BILITOT 0.26 10/14/2017 1523       RADIOGRAPHIC STUDIES: No results found.  ASSESSMENT AND PLAN: This is a very pleasant 77 years old white male with metastatic non-small cell lung cancer, adenocarcinoma status post short course of concurrent chemoradiation with weekly carboplatin and paclitaxel followed by 1 cycle of consolidation chemotherapy discontinued secondary to intolerance. He was a started on treatment with  immunotherapy with Nivolumab 240 mg IV every 2 weeks status post 35 cycles.  He tolerated the previous treatment well with no significant adverse effects. His treatment was switched to a Nivolumab 480 mg IV every 4 weeks status post 9 cycles.   The patient has been tolerating this treatment well with no concerning complaints.  I recommended for him to proceed with cycle #10 today as a scheduled. For the lack of appetite and weight loss, I will start the patient on Marinol 2.5 mg p.o. twice daily. For the left-sided chest pain, I will order a chest x-ray to rule out any rib fracture or other abnormality explaining his pain. He will come back for follow-up visit in 4 weeks for evaluation before starting the next cycle of his treatment. He was advised to call immediately if he has any concerning symptoms in the interval. The patient voices understanding of current disease status and treatment options and is in agreement with the current care plan. All questions were answered. The patient knows to call the clinic with any problems, questions or concerns. We can certainly see the patient much sooner if necessary.  Disclaimer: This note was dictated with voice recognition software. Similar sounding words can inadvertently be transcribed and may not be corrected upon review.

## 2018-03-04 LAB — TSH: TSH: 0.911 u[IU]/mL (ref 0.320–4.118)

## 2018-03-18 ENCOUNTER — Telehealth: Payer: Self-pay | Admitting: Medical Oncology

## 2018-03-18 NOTE — Telephone Encounter (Signed)
Thick watery diarrhea 2 x day for 2 weeksT. Pt is replenishing fluids . Denies cramping. Taking imodium 1 tablet after each diarrheal stool. Per [facility] monitor for now and take imodium 2 tabs after first diarrhea stool of day then 1 tab after each subsequent stool.  Call for increasing uncontrolled diarrhea or weight loss. Pt voices understanding.

## 2018-03-19 ENCOUNTER — Ambulatory Visit (INDEPENDENT_AMBULATORY_CARE_PROVIDER_SITE_OTHER): Payer: 59 | Admitting: Internal Medicine

## 2018-03-19 ENCOUNTER — Encounter: Payer: Self-pay | Admitting: Internal Medicine

## 2018-03-19 VITALS — BP 120/76 | HR 84 | Ht 70.0 in | Wt 108.6 lb

## 2018-03-19 DIAGNOSIS — R05 Cough: Secondary | ICD-10-CM | POA: Insufficient documentation

## 2018-03-19 DIAGNOSIS — R053 Chronic cough: Secondary | ICD-10-CM

## 2018-03-19 DIAGNOSIS — J449 Chronic obstructive pulmonary disease, unspecified: Secondary | ICD-10-CM

## 2018-03-19 NOTE — Progress Notes (Signed)
Subjective:     Patient ID: Gabriel [name], male   DOB: 05/26/1941, 77 y.o.   MRN: [id number]  HPI OV 02/13/2017  Chief Complaint  Patient presents with  . Follow-up    Pt saw SG in Feb 2018 for an acute visit. Pt states he is improved and states his breathing is back to baseline. Pt c/o prod cough with white mucus - pt states this is baseline. Pt denies CP/tightness and f/c/s.    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  He is on immune therapy for his advanced lung cancer. He had CT scan of the chest 02/01/2017 the visualized overall he will fibrosis is significantly improved in the last 2 years. There is some nodules which she believes do not represent cancer and is on continued immunotherapy. At this point in time he feels stable but with good functional status. He is fatigued though he did try pulmonary rehabilitation it did not help. He also has some amount of cough with mild white mucus. Prednisone a few months ago helped him a lot he took that for 4 weeks. He is on Symbicort. He does feel he can be better with the symptoms. He is not interested in chronic daily prednisone      OV 05/22/2017  Chief Complaint  Patient presents with  . Follow-up    Pt states since starting the tudorza his breathing has improved. Pt c/o prod cough with white mucus. Pt denies CP/tightness and f/c/s.     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  He is on immune therapy for his advanced lung cancer. This visit is to follow-up addition of anticholinergic agent Tudorza to his Symbicort to see if it improves symptoms. He says he's had a modest improvement and shortness of breath because of this addition. Overall good functional status. He continues to work. He continues to complain of significant fatigue  at the end of the day when he returns from work. Previously pulmonary rehabilitation would help. He says that he's had previous thyroid function test checked and it was normal. He takes vitamin D supplement but does not know if he's had a level checked Walking desaturation test 185 feet 3 laps on room air the forehead probe:    OV 12/04/2017  Chief Complaint  Patient presents with  . 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 STage 4 NSCLC   STill on nivolumab Rx. In dec 2018 had CT and show stable metastatic lung cancer. Last nivolumab recnetly. Reports 1 month ago had cold and since recovering by self having harder time coughing and more chest tightnes. Smoking now. COPD cat score 19. Also reports chronic stable fatigue; workds full time [employer] 0 so unable to do rehab unless I insist    OV 03/19/2018  Chief Complaint  Patient presents with  . Follow-up    f/u COPD, breathing is doing ok, has been trying to exercise, still smoking 0.5 pack a day, diarrhea lately but has doubled his imodium instructed by Dr. Julien Nordmann   77 year old male with stage II COPD with mixed restriction  because of right upper lobe fibrosis following significant pneumonia and lung infection in 2016.  Also stage IV non-small cell lung cancer  Still on nivolumab treatment.  In March 2019 he had CT scan of the chest that is reported as progressive disease but he tells me that Dr. Julien Nordmann viewed this is minimal change.  He continues on nivolumab infusions.  He tells me that since his last visit he completely quit taking anticholinergic inhaler and this actually improved his cough.  Overall his symptom score COPD CAT score is 18.  In the interim he has had diarrhea and weight  loss which apparently is related to nivolumab.  He is increased his Imodium.  He is not exercising.  He continues to smoke.  He plans to restart exercise.  But from a COPD standpoint he feels good and is wondering about de-escalating given Symbicort.    CAT COPD Symptom & Quality of Life Score (GSK trademark) 0 is no burden. 5 is highest burden 12/04/17  03/19/2018   Never Cough -> Cough all the time 4 3  No phlegm in chest -> Chest is full of phlegm 4 3  No chest tightness -> Chest feels very tight 0 1  No dyspnea for 1 flight stairs/hill -> Very dyspneic for 1 flight of stairs 3 2  No limitations for ADL at home -> Very limited with ADL at home 2 3  Confident leaving home -> Not at all confident leaving home 0 0  Sleep soundly -> Do not sleep soundly because of lung condition 1 1  Lots of Energy -> No energy at all 5 5  TOTAL Score (max 40)  19 18     IMPRESSION: CT CHEST IMPRESSION  1. Increased disease within the lungs. The right upper lobe consolidation is more masslike today, with decreased air bronchograms. In addition, many of the bilateral pulmonary nodules have undergone mild interval enlargement. 2. Similar right paravertebral metastasis. 3. Similar thoracic adenopathy. 4. Coronary artery atherosclerosis. Aortic Atherosclerosis (ICD10-I70.0).   Review of Systems     Objective:   Physical Exam Vitals:   03/19/18 0959  BP: 120/76  Pulse: 84  SpO2: 98%  Weight: 108 lb 9.6 oz (49.3 kg)  Height: 5\' 10"  (1.778 m)     General Appearance:    Lean BMI. Looks like lost some weight since last visti  Head:    Normocephalic, without obvious abnormality, atraumatic  Eyes:    PERRL - yes, conjunctiva/corneas - clear      Ears:    Normal external ear canals, both ears  Nose:   NG tube - no  Throat:  ETT TUBE - no , OG tube - no  Neck:   Supple,  No enlargement/tenderness/nodules     Lungs:      Largely clear with bronchial breath sound righg  Chest wall:     Scoliotic with right chest droop  Heart:    S1 and S2 normal, no murmur, CVP - no.  Pressors - no  Abdomen:     Soft, no masses, no organomegaly  Genitalia:    Not done  Rectal:   not done  Extremities:   Extremities- intact     Skin:   Intact in exposed areas .      Neurologic:   Sedation - none -> RASS - na . Moves all 4s - yes. CAM-ICU - neg . Orientation - x 3+        Assessment:  ICD-10-CM   1. Stage 2 moderate COPD by GOLD classification ([place]) J44.9   2. Chronic cough R05        Plan:       Glad you are better esp after you stopped tudorza which was making your cough worse  Plan Continue symbicort daily but you can experiment backing off on it and seeing how your symptoms do Albuterol as needed Mucinex as needed v scheduled to clear mucus Exercise therapy Cancer support via Dr Julien Nordmann  Followup 6 months or sooner if needed; CAT score at followup   Dr. Brand Males, M.D., [facility].C.P Pulmonary and Critical Care Medicine Staff Physician, [place] Director - Interstitial Lung Disease  Program  Pulmonary [place] [facility] [place], [place], [address]  Pager: [phone], If no answer or between  15:00h - 7:00h: call [phone] Telephone: [phone]

## 2018-03-19 NOTE — Patient Instructions (Addendum)
ICD-10-CM   1. Stage 2 moderate COPD by GOLD classification ([place]) J44.9   2. Chronic cough R05     Glad you are better esp after you stopped tudorza which was making your cough worse  Plan Continue symbicort daily but you can experiment backing off on it and seeing how your symptoms do Albuterol as needed Mucinex as needed v scheduled to clear mucus Exercise therapy Cancer support via Dr Julien Nordmann  Followup 6 months or sooner if needed; CAT score at followup

## 2018-03-25 ENCOUNTER — Other Ambulatory Visit: Payer: Self-pay | Admitting: Medical Oncology

## 2018-03-25 ENCOUNTER — Inpatient Hospital Stay: Payer: 59

## 2018-03-25 ENCOUNTER — Telehealth: Payer: Self-pay | Admitting: Medical Oncology

## 2018-03-25 ENCOUNTER — Inpatient Hospital Stay (HOSPITAL_BASED_OUTPATIENT_CLINIC_OR_DEPARTMENT_OTHER): Payer: 59 | Admitting: Medical

## 2018-03-25 VITALS — BP 133/81 | HR 92 | Temp 98.0°F | Resp 18 | Ht 70.0 in | Wt 107.4 lb

## 2018-03-25 DIAGNOSIS — Z79899 Other long term (current) drug therapy: Secondary | ICD-10-CM | POA: Diagnosis not present

## 2018-03-25 DIAGNOSIS — F17210 Nicotine dependence, cigarettes, uncomplicated: Secondary | ICD-10-CM

## 2018-03-25 DIAGNOSIS — Z923 Personal history of irradiation: Secondary | ICD-10-CM

## 2018-03-25 DIAGNOSIS — Z85828 Personal history of other malignant neoplasm of skin: Secondary | ICD-10-CM

## 2018-03-25 DIAGNOSIS — I10 Essential (primary) hypertension: Secondary | ICD-10-CM | POA: Diagnosis not present

## 2018-03-25 DIAGNOSIS — Z8781 Personal history of (healed) traumatic fracture: Secondary | ICD-10-CM | POA: Diagnosis not present

## 2018-03-25 DIAGNOSIS — C3411 Malignant neoplasm of upper lobe, right bronchus or lung: Secondary | ICD-10-CM | POA: Diagnosis not present

## 2018-03-25 DIAGNOSIS — K529 Noninfective gastroenteritis and colitis, unspecified: Secondary | ICD-10-CM

## 2018-03-25 DIAGNOSIS — Z9221 Personal history of antineoplastic chemotherapy: Secondary | ICD-10-CM

## 2018-03-25 DIAGNOSIS — C3491 Malignant neoplasm of unspecified part of right bronchus or lung: Secondary | ICD-10-CM

## 2018-03-25 DIAGNOSIS — R197 Diarrhea, unspecified: Secondary | ICD-10-CM

## 2018-03-25 DIAGNOSIS — Z5112 Encounter for antineoplastic immunotherapy: Secondary | ICD-10-CM | POA: Diagnosis not present

## 2018-03-25 DIAGNOSIS — R0789 Other chest pain: Secondary | ICD-10-CM | POA: Diagnosis not present

## 2018-03-25 LAB — CMP (CANCER CENTER ONLY)
ALK PHOS: 99 U/L (ref 40–150)
ALT: 18 U/L (ref 0–55)
ANION GAP: 9 (ref 3–11)
AST: 19 U/L (ref 5–34)
Albumin: 3.3 g/dL — ABNORMAL LOW (ref 3.5–5.0)
BILIRUBIN TOTAL: 0.3 mg/dL (ref 0.2–1.2)
BUN: 28 mg/dL — ABNORMAL HIGH (ref 7–26)
CALCIUM: 9.4 mg/dL (ref 8.4–10.4)
CO2: 25 mmol/L (ref 22–29)
CREATININE: 0.99 mg/dL (ref 0.70–1.30)
Chloride: 107 mmol/L (ref 98–109)
GFR, Estimated: >60 mL/min (ref >60)
Glucose, Bld: 98 mg/dL (ref 70–140)
Potassium: 4.2 mmol/L (ref 3.5–5.1)
SODIUM: 141 mmol/L (ref 136–145)
TOTAL PROTEIN: 7.1 g/dL (ref 6.4–8.3)

## 2018-03-25 LAB — CBC WITH DIFFERENTIAL (CANCER CENTER ONLY)
BASOS ABS: 0 10*3/uL (ref 0.0–0.1)
BASOS PCT: 1 %
EOS ABS: 0.1 10*3/uL (ref 0.0–0.5)
Eosinophils Relative: 1 %
HCT: 39.9 % (ref 38.4–49.9)
HEMOGLOBIN: 13.1 g/dL (ref 13.0–17.1)
Lymphocytes Relative: 14 %
Lymphs Abs: 0.7 10*3/uL — ABNORMAL LOW (ref 0.9–3.3)
MCH: 28.7 pg (ref 27.2–33.4)
MCHC: 32.9 g/dL (ref 32.0–36.0)
MCV: 87 fL (ref 79.3–98.0)
MONOS PCT: 13 %
Monocytes Absolute: 0.7 10*3/uL (ref 0.1–0.9)
NEUTROS PCT: 71 %
Neutro Abs: 3.8 10*3/uL (ref 1.5–6.5)
Platelet Count: 249 10*3/uL (ref 140–400)
RBC: 4.59 MIL/uL (ref 4.20–5.82)
RDW: 14.8 % — AB (ref 11.0–14.6)
WBC Count: 5.3 10*3/uL (ref 4.0–10.3)

## 2018-03-25 MED ORDER — PREDNISONE 10 MG PO TABS: ORAL_TABLET | ORAL | 0 refills | Status: DC

## 2018-03-25 NOTE — Telephone Encounter (Signed)
Pt reports uncontrolled diarrhea 3-4 times a day with imodium 2 in am and 1 after each stool . He is not eating . Per [place] labs and [facility].

## 2018-03-26 NOTE — Progress Notes (Signed)
[facility] Progress Note   [name] [id number] 1941-08-06 77 y.o.  Gabriel [name] is managed by Dr. Fanny Bien. Mohamed  Actively treated with chemotherapy/immunotherapy: yes  Current Therapy: Nivolumab  Last Treated:  03/03/2018 (cycle 10, day 1)  Assessment: Plan:    Diarrhea, unspecified type - Plan: predniSONE (DELTASONE) 10 MG tablet  Colitis - Plan: predniSONE (DELTASONE) 10 MG tablet  Non-small cell carcinoma of right lung, stage 3 ([place])   Diarrhea and suspected colitis: After a literature review was completed, the patient was begun on an extended steroid taper in case the etiology of the patient's symptoms was related to his ongoing use of nivolumab.  This will be communicated to Dr. Julien Nordmann so that he can adjust the patient's prednisone taper as he deems appropriate.  Metastatic non-small cell carcinoma of the right lung: The patient is status post cycle 10 of nivolumab which was dosed on 03/03/2018.   Please see After Visit Summary for patient specific instructions.  Future Appointments  Date Time Provider [place]  03/31/2018  3:00 PM [facility] LAB 1 [facility] None  03/31/2018  3:30 PM Curt Bears, MD [facility] None  03/31/2018  4:00 PM [facility] I25 DNS [facility] None  04/28/2018  3:00 PM [facility] LAB 2 [facility] None  04/28/2018  3:30 PM Curt Bears, MD [facility] None  04/28/2018  4:00 PM [facility] I25 DNS [facility] None  05/26/2018  2:45 PM [facility] LAB 1 [facility] None  05/26/2018  3:15 PM Curt Bears, MD [facility] None  05/26/2018  4:00 PM [facility] I27 DNS [facility] None    No orders of the defined types were placed in this encounter.      Subjective:   Patient ID:  Gabriel [name] is a 77 y.o. (DOB 11/20/1940) male.  Chief Complaint:  Chief Complaint  Patient presents with  . Diarrhea    HPI Gabriel [name] is a 77 year old male with a metastatic non-small cell lung cancer,  adenocarcinoma which was initially diagnosed as an unresectable stage IIIa (T2a, N2, M0) in July 2016.  Mr. Moehring is managed by Dr. Parthenia Ames. Mohammed and is currently treated on immunotherapy with nivolumab at 240 mg IV every 2 weeks.  He was most recently treated with cycle 10, day 1 on 03/03/2018.  He reports having diarrhea for the last 4 weeks.  He is having fecal urgency.  He is having around 4 watery bowel movements every day.  He has been using Imodium.  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  He has been eating less due to his diarrhea.  He has had a greater than 3 pound weight loss since 03/03/2018.  He is having mild diffuse abdominal discomfort.  He denies fevers, chills, sweats, nausea, or vomiting.   Medications: I have reviewed the patient's current medications.  Allergies:  Allergies  Allergen Reactions  . Aleve [Naproxen] Rash    Past Medical History:  Diagnosis Date  . Cancer ([place])    skin cancer  . Constipation    AFTER ANESTHESIA  . Difficulty sleeping   . History of nonmelanoma skin cancer   . Hypertension 10/30/2016  . Inguinal hernia   . Lung mass    right / HAS HAD RADIATION AND CHEMO FOR LUNG CANCER  . Pneumothorax on right 08/24/2015  . Productive cough    "DUE TO RADIATION"  . Radiation 05/11/15-06/21/15   NSCLCA/right lung  .  Smoking 1/2 pack a day or less 09/17/2016  . Spontaneous pneumothorax MANY YRS AGO AND AGAIN 04/04/15   HISTORY OF (ONLY)  . Weight loss 04/01/2017    Past Surgical History:  Procedure Laterality Date  . CHEST TUBE INSERTION Right 08/02/2015   Procedure: INSERTION OF SECOND RIGHT CHEST TUBE WITH FLUROSCOPY;  Surgeon: Grace Isaac, MD;  Location: [facility];  Service: Thoracic;  Laterality: Right;  . CHEST TUBE INSERTION  07/28/2015  . COLONOSCOPY W/ BIOPSIES AND POLYPECTOMY    . HERNIA REPAIR  2011  . INGUINAL HERNIA REPAIR Left 07/15/2015    Procedure: OPEN LEFT INGUINAL HERNIA REPAIR;  Surgeon: Johnathan Hausen, MD;  Location: [facility];  Service: General;  Laterality: Left;  With MESH  . TONSILLECTOMY    . VIDEO BRONCHOSCOPY WITH ENDOBRONCHIAL ULTRASOUND N/A 04/18/2015   Procedure: VIDEO BRONCHOSCOPY WITH ENDOBRONCHIAL ULTRASOUND;  Surgeon: Grace Isaac, MD;  Location: [facility];  Service: Thoracic;  Laterality: N/A;  . VIDEO BRONCHOSCOPY WITH INSERTION OF INTERBRONCHIAL VALVE (IBV) N/A 08/02/2015   Procedure: VIDEO BRONCHOSCOPY WITH ATTEMPTED INSERTION OF INTERBRONCHIAL VALVE (IBV) WITH FLUROSCOPY;  Surgeon: Grace Isaac, MD;  Location: [facility];  Service: Thoracic;  Laterality: N/A;  . WISDOM TOOTH EXTRACTION      Family History  Problem Relation Age of Onset  . Heart attack Father   . Heart disease Father   . Hypertension Mother     Social History   Socioeconomic History  . Marital status: Divorced    Spouse name: Not on file  . Number of children: Not on file  . Years of education: Not on file  . Highest education level: Not on file  Occupational History  . Occupation: Lobbyist: [employer]  . Financial resource strain: Not on file  . Food insecurity:    Worry: Not on file    Inability: Not on file  . Transportation needs:    Medical: Not on file    Non-medical: Not on file  Tobacco Use  . Smoking status: Current Every Day Smoker    Packs/day: 0.25    Years: 55.00    Pack years: 13.75    Types: Cigarettes  . Smokeless tobacco: Never Used  . Tobacco comment: 1/2 ppd 7.25.18 ee  Substance and Sexual Activity  . Alcohol use: Yes    Alcohol/week: 0.0 oz    Comment: every other night -social  . Drug use: No  . Sexual activity: Not Currently  Lifestyle  . Physical activity:    Days per week: Not on file    Minutes per session: Not on file  . Stress: Not on file  Relationships  . Social connections:    Talks on phone: Not on file    Gets together: Not on file     Attends religious service: Not on file    Active member of club or organization: Not on file    Attends meetings of clubs or organizations: Not on file    Relationship status: Not on file  . Intimate partner violence:    Fear of current or ex partner: Not on file    Emotionally abused: Not on file    Physically abused: Not on file    Forced sexual activity: Not on file  Other Topics Concern  . Not on file  Social History Narrative  . Not on file    Past Medical History, Surgical history, Social history, and Family history were  reviewed and updated as appropriate.   Please see review of systems for further details on the patient's review from today.   Review of Systems:  Review of Systems  Constitutional: Positive for unexpected weight change. Negative for appetite change, chills, diaphoresis and fever.  Respiratory: Negative for cough, chest tightness and shortness of breath.   Cardiovascular: Negative for chest pain, palpitations and leg swelling.  Gastrointestinal: Positive for diarrhea. Negative for abdominal distention, abdominal pain, blood in stool, constipation, nausea and vomiting.  Genitourinary: Negative for decreased urine volume and difficulty urinating.  Neurological: Negative for weakness.    Objective:   Physical Exam:  BP 133/81 (BP Location: Right Arm, Patient Position: Sitting)   Pulse 92   Temp 98 F (36.7 C) (Oral)   Resp 18   Ht 5\' 10"  (1.778 m)   Wt 107 lb 6.4 oz (48.7 kg)   SpO2 100%   BMI 15.41 kg/m  ECOG: 0  Physical Exam  Constitutional: No distress.  The patient is thin elderly male who appears to be in no acute distress.  HENT:  Head: Normocephalic and atraumatic.  Mouth/Throat: Oropharynx is clear and moist.  Cardiovascular: Normal rate, regular rhythm and normal heart sounds. Exam reveals no gallop and no friction rub.  No murmur heard. Pulmonary/Chest: Effort normal and breath sounds normal. No respiratory distress. He has no wheezes.  He has no rales.  Abdominal: Soft. Bowel sounds are normal. He exhibits no distension and no mass. There is no tenderness. There is no rebound and no guarding.  The patient's abdomen is scaphoid.  Musculoskeletal: He exhibits no edema.  Neurological: He is alert.  Skin: Skin is warm and dry. He is not diaphoretic.  Psychiatric: He has a normal mood and affect. His behavior is normal. Judgment and thought content normal.    Lab Review:     Component Value Date/Time   NA 141 03/25/2018 1434   NA 139 10/14/2017 1523   K 4.2 03/25/2018 1434   K 3.9 10/14/2017 1523   CL 107 03/25/2018 1434   CO2 25 03/25/2018 1434   CO2 24 10/14/2017 1523   GLUCOSE 98 03/25/2018 1434   GLUCOSE 95 10/14/2017 1523   BUN 28 (H) 03/25/2018 1434   BUN 27.2 (H) 10/14/2017 1523   CREATININE 0.99 03/25/2018 1434   CREATININE 0.9 10/14/2017 1523   CALCIUM 9.4 03/25/2018 1434   CALCIUM 9.5 10/14/2017 1523   PROT 7.1 03/25/2018 1434   PROT 7.8 10/14/2017 1523   ALBUMIN 3.3 (L) 03/25/2018 1434   ALBUMIN 3.9 10/14/2017 1523   AST 19 03/25/2018 1434   AST 21 10/14/2017 1523   ALT 18 03/25/2018 1434   ALT 20 10/14/2017 1523   ALKPHOS 99 03/25/2018 1434   ALKPHOS 111 10/14/2017 1523   BILITOT 0.3 03/25/2018 1434   BILITOT 0.26 10/14/2017 1523   GFRNONAA >60 03/25/2018 1434   GFRAA >60 03/25/2018 1434       Component Value Date/Time   WBC 5.3 03/25/2018 1434   WBC 8.6 03/03/2018 1506   RBC 4.59 03/25/2018 1434   HGB 13.1 03/25/2018 1434   HGB 13.1 10/14/2017 1522   HCT 39.9 03/25/2018 1434   HCT 39.8 10/14/2017 1522   PLT 249 03/25/2018 1434   PLT 263 10/14/2017 1522   MCV 87.0 03/25/2018 1434   MCV 90.6 10/14/2017 1522   MCH 28.7 03/25/2018 1434   MCHC 32.9 03/25/2018 1434   RDW 14.8 (H) 03/25/2018 1434   RDW 14.3 10/14/2017 1522  LYMPHSABS 0.7 (L) 03/25/2018 1434   LYMPHSABS 0.8 (L) 10/14/2017 1522   MONOABS 0.7 03/25/2018 1434   MONOABS 0.5 10/14/2017 1522   EOSABS 0.1 03/25/2018 1434    EOSABS 0.2 10/14/2017 1522   BASOSABS 0.0 03/25/2018 1434   BASOSABS 0.0 10/14/2017 1522   -------------------------------  Imaging from last 24 hours (if applicable):  Radiology interpretation: Dg Chest 1 View  Result Date: 03/04/2018 CLINICAL DATA:  History of lung surgery 2 and half years ago, short of breath, chest pain, smoking history EXAM: CHEST  1 VIEW COMPARISON:  CT chest of 01/03/2017 and chest x-ray of 11/26/2016 FINDINGS: There is opacity within the right upper hemithorax consistent with scarring with retraction of trachea toward the right upper hemithorax. Within the aerated right lung at the lung base there is vague opacity present probably representing scarring but attention to this area on follow-up chest x-ray is recommended. Some blunting of the right costophrenic angle appears chronic. The left lung is somewhat hyperaerated. There are 2 vague nodular opacities in the left upper lung which may have been present previously and, compared to the prior CT chest, most likely represent scarring. Heart is within normal limits in size. No bony abnormality is noted. IMPRESSION: 1. Postoperative changes on the right with opacity of the right upper and mid hemithorax with retraction of the trachea toward the right apex. 2. Small opacity in the aerated right lung base possible scarring when compared to prior CT chest, but recommend attention to this area on follow-up chest x-ray. 3. Probable scarring in the left upper lung when compared to the prior CT chest. Electronically Signed   By: Ivar Drape M.D.   On: 03/04/2018 09:15

## 2018-03-30 ENCOUNTER — Other Ambulatory Visit: Payer: Self-pay | Admitting: Internal Medicine

## 2018-03-30 DIAGNOSIS — C3491 Malignant neoplasm of unspecified part of right bronchus or lung: Secondary | ICD-10-CM

## 2018-03-31 ENCOUNTER — Telehealth: Payer: Self-pay | Admitting: Internal Medicine

## 2018-03-31 ENCOUNTER — Inpatient Hospital Stay: Payer: 59

## 2018-03-31 ENCOUNTER — Encounter: Payer: Self-pay | Admitting: Internal Medicine

## 2018-03-31 ENCOUNTER — Inpatient Hospital Stay: Payer: 59 | Attending: Oncology | Admitting: Internal Medicine

## 2018-03-31 VITALS — BP 157/81 | HR 84 | Temp 98.2°F | Resp 18 | Ht 70.0 in | Wt 106.5 lb

## 2018-03-31 DIAGNOSIS — R634 Abnormal weight loss: Secondary | ICD-10-CM | POA: Insufficient documentation

## 2018-03-31 DIAGNOSIS — R5383 Other fatigue: Secondary | ICD-10-CM

## 2018-03-31 DIAGNOSIS — K529 Noninfective gastroenteritis and colitis, unspecified: Secondary | ICD-10-CM

## 2018-03-31 DIAGNOSIS — Z79899 Other long term (current) drug therapy: Secondary | ICD-10-CM | POA: Diagnosis not present

## 2018-03-31 DIAGNOSIS — R05 Cough: Secondary | ICD-10-CM | POA: Diagnosis not present

## 2018-03-31 DIAGNOSIS — F17210 Nicotine dependence, cigarettes, uncomplicated: Secondary | ICD-10-CM | POA: Diagnosis not present

## 2018-03-31 DIAGNOSIS — K4090 Unilateral inguinal hernia, without obstruction or gangrene, not specified as recurrent: Secondary | ICD-10-CM | POA: Insufficient documentation

## 2018-03-31 DIAGNOSIS — C3411 Malignant neoplasm of upper lobe, right bronchus or lung: Secondary | ICD-10-CM | POA: Insufficient documentation

## 2018-03-31 DIAGNOSIS — Z923 Personal history of irradiation: Secondary | ICD-10-CM | POA: Insufficient documentation

## 2018-03-31 DIAGNOSIS — Z5112 Encounter for antineoplastic immunotherapy: Secondary | ICD-10-CM

## 2018-03-31 DIAGNOSIS — C3491 Malignant neoplasm of unspecified part of right bronchus or lung: Secondary | ICD-10-CM

## 2018-03-31 DIAGNOSIS — I10 Essential (primary) hypertension: Secondary | ICD-10-CM | POA: Insufficient documentation

## 2018-03-31 DIAGNOSIS — Z85828 Personal history of other malignant neoplasm of skin: Secondary | ICD-10-CM | POA: Insufficient documentation

## 2018-03-31 DIAGNOSIS — R630 Anorexia: Secondary | ICD-10-CM | POA: Insufficient documentation

## 2018-03-31 DIAGNOSIS — Z9221 Personal history of antineoplastic chemotherapy: Secondary | ICD-10-CM | POA: Diagnosis not present

## 2018-03-31 DIAGNOSIS — J449 Chronic obstructive pulmonary disease, unspecified: Secondary | ICD-10-CM

## 2018-03-31 DIAGNOSIS — R053 Chronic cough: Secondary | ICD-10-CM

## 2018-03-31 DIAGNOSIS — R197 Diarrhea, unspecified: Secondary | ICD-10-CM | POA: Insufficient documentation

## 2018-03-31 DIAGNOSIS — C3490 Malignant neoplasm of unspecified part of unspecified bronchus or lung: Secondary | ICD-10-CM

## 2018-03-31 LAB — COMPREHENSIVE METABOLIC PANEL
ALBUMIN: 3.5 g/dL (ref 3.5–5.0)
ALK PHOS: 111 U/L (ref 40–150)
ALT: 15 U/L (ref 0–55)
ANION GAP: 11 (ref 3–11)
AST: 14 U/L (ref 5–34)
BILIRUBIN TOTAL: 0.3 mg/dL (ref 0.2–1.2)
BUN: 24 mg/dL (ref 7–26)
CALCIUM: 9.2 mg/dL (ref 8.4–10.4)
CO2: 25 mmol/L (ref 22–29)
CREATININE: 0.93 mg/dL (ref 0.70–1.30)
Chloride: 103 mmol/L (ref 98–109)
GFR calc Af Amer: >60 mL/min (ref >60)
GFR calc non Af Amer: >60 mL/min (ref >60)
GLUCOSE: 102 mg/dL (ref 70–140)
Potassium: 3.6 mmol/L (ref 3.5–5.1)
Sodium: 139 mmol/L (ref 136–145)
TOTAL PROTEIN: 7.3 g/dL (ref 6.4–8.3)

## 2018-03-31 LAB — CBC WITH DIFFERENTIAL/PLATELET
BASOS ABS: 0 10*3/uL (ref 0.0–0.1)
BASOS PCT: 0 %
EOS ABS: 0 10*3/uL (ref 0.0–0.5)
EOS PCT: 0 %
HCT: 40 % (ref 38.4–49.9)
Hemoglobin: 13.5 g/dL (ref 13.0–17.1)
Lymphocytes Relative: 3 %
Lymphs Abs: 0.3 10*3/uL — ABNORMAL LOW (ref 0.9–3.3)
MCH: 29.3 pg (ref 27.2–33.4)
MCHC: 33.7 g/dL (ref 32.0–36.0)
MCV: 86.7 fL (ref 79.3–98.0)
MONO ABS: 0.1 10*3/uL (ref 0.1–0.9)
Monocytes Relative: 1 %
Neutro Abs: 8.9 10*3/uL — ABNORMAL HIGH (ref 1.5–6.5)
Neutrophils Relative %: 96 %
PLATELETS: 257 10*3/uL (ref 140–400)
RBC: 4.62 MIL/uL (ref 4.20–5.82)
RDW: 15.1 % — AB (ref 11.0–14.6)
WBC: 9.4 10*3/uL (ref 4.0–10.3)

## 2018-03-31 MED ORDER — PREDNISONE 10 MG PO TABS: ORAL_TABLET | ORAL | 0 refills | Status: DC

## 2018-03-31 NOTE — Progress Notes (Signed)
[place] [phone]   [phone]  OFFICE PROGRESS NOTE  Aura Dials, MD [place] [address]  DIAGNOSIS: Metastatic non-small cell lung cancer, adenocarcinoma initially diagnosed as unresectable stage IIIa (T2a, N2, M0) in July 2016.  Foundation One molecular studies reveal that he was positive for [place], [place], and STAG2 [id number]. He was negative for: EGFR, ALK, BRAF, MET, RET, ERBB2 and ROS1  PRIOR THERAPY: 1) Concurrent chemoradiation with chemotherapy in the form of weekly carboplatin for AUC 2 paclitaxel 45 mg/m given concurrent with radiation therapy. First cycle started 05/09/2015. Status post 7 cycles. 2) Consolidation chemotherapy with carboplatin for AUC of 5 on day 1 and gemcitabine 1000 MG/M2 on days 1 and 8 every 3 weeks. First dose 08/15/2015. Discontinued secondary to intolerance. 3)  Immunotherapy with Nivolumab 240 mg IV every 2 weeks, status post 35 cycles.  CURRENT THERAPY: Immunotherapy with Nivolumab 480 mg IV every 4 weeks status post 10 cycles.  INTERVAL HISTORY: Gabriel WARRELL77y.o. male returns to the clinic today for follow-up visit accompanied by his son and daughter.  The patient has been complaining of daily diarrhea for the last 7-10 days.  He was seen at the symptom management clinic last week and started on a taper dose of prednisone.  He continues to have diarrhea but today down to 2 times daily.  He denied having any chest pain, shortness of breath but continues to have cough with no hemoptysis.  He has no recent weight loss or night sweats.  He has no nausea or vomiting.  He was supposed to start cycle #11 of his treatment today.  He is here for evaluation and management of his condition.  MEDICAL HISTORY: Past Medical History:  Diagnosis Date  . [place]    skin cancer  . Constipation    AFTER ANESTHESIA  . Difficulty sleeping   . History of nonmelanoma skin  cancer   . Hypertension 10/30/2016  . Inguinal hernia   . Lung mass    right / HAS HAD RADIATION AND CHEMO FOR LUNG CANCER  . Pneumothorax on right 08/24/2015  . Productive cough    "DUE TO RADIATION"  . Radiation 05/11/15-06/21/15   NSCLCA/right lung  . Smoking 1/2 pack a day or less 09/17/2016  . Spontaneous pneumothorax MANY YRS AGO AND AGAIN 04/04/15   HISTORY OF (ONLY)  . Weight loss 04/01/2017    ALLERGIES:  is allergic to aleve [naproxen].  MEDICATIONS:  Current Outpatient Medications  Medication Sig Dispense Refill  . albuterol (PROVENTIL HFA) 108 (90 Base) MCG/ACT inhaler Inhale 2 puffs into the lungs every 6 (six) hours as needed for wheezing or shortness of breath.    . Arginine (RA L-ARGININE) 1000 MG TABS Take 500 mg by mouth daily.     . Ascorbic Acid (VITAMIN C) 1000 MG tablet Take 1,000 mg by mouth daily. Pt takes 2 5028mtabs    . B Complex Vitamins (B COMPLEX PO) Take 1 capsule by mouth daily.     . Marland KitchenALCIUM CITRATE PO Take 1 tablet by mouth daily.    . Cyanocobalamin (VITAMIN B 12 PO) Take 1 tablet by mouth daily.    . Diphenhyd-Hydrocort-Nystatin (FIRST-DUKES MOUTHWASH) SUSP USE 5 ML TO SWISH AND SWALLOW 3 TIMES A DAY  8  . dronabinol (MARINOL) 2.5 MG capsule Take 1 capsule (2.5 mg total) by mouth 2 (two) times daily before a meal. 60  capsule 0  . ferrous sulfate 325 (65 FE) MG tablet Take 325 mg by mouth daily with breakfast.    . guaiFENesin (MUCINEX) 600 MG 12 hr tablet Take 1 tablet (600 mg total) by mouth 2 (two) times daily as needed. (Patient taking differently: Take 1,200 mg by mouth 2 (two) times daily as needed. )    . ibuprofen (ADVIL,MOTRIN) 200 MG tablet Take 200 mg by mouth 3 (three) times daily.    . magnesium 30 MG tablet Take 30 mg by mouth daily.    . Multiple Vitamin (MULTI VITAMIN DAILY PO) Take 1 tablet by mouth daily.    . Omega-3 Fatty Acids (FISH OIL) 1000 MG CAPS Take 1,000 mg by mouth daily.    . predniSONE (DELTASONE) 10 MG tablet 4 tablets x 4  days, 3 tablet x 4 days, 2 tablets x 4 days, 1 tab x 4 days, 1/2 tab x 4 days 42 tablet 0  . saccharomyces boulardii (FLORASTOR) 250 MG capsule Take 250 mg by mouth 2 (two) times daily.    . SYMBICORT 160-4.5 MCG/ACT inhaler INHALE 2 PUFFS TWICE DAILY 10.2 Inhaler 5  . vitamin A 10000 UNIT capsule 1 capsule with food or milk     No current facility-administered medications for this visit.     SURGICAL HISTORY:  Past Surgical History:  Procedure Laterality Date  . CHEST TUBE INSERTION Right 08/02/2015   Procedure: INSERTION OF SECOND RIGHT CHEST TUBE WITH FLUROSCOPY;  Surgeon: Grace Isaac, MD;  Location: [facility];  Service: Thoracic;  Laterality: Right;  . CHEST TUBE INSERTION  07/28/2015  . COLONOSCOPY W/ BIOPSIES AND POLYPECTOMY    . HERNIA REPAIR  2011  . INGUINAL HERNIA REPAIR Left 07/15/2015   Procedure: OPEN LEFT INGUINAL HERNIA REPAIR;  Surgeon: Johnathan Hausen, MD;  Location: [facility];  Service: General;  Laterality: Left;  With MESH  . TONSILLECTOMY    . VIDEO BRONCHOSCOPY WITH ENDOBRONCHIAL ULTRASOUND N/A 04/18/2015   Procedure: VIDEO BRONCHOSCOPY WITH ENDOBRONCHIAL ULTRASOUND;  Surgeon: Grace Isaac, MD;  Location: [facility];  Service: Thoracic;  Laterality: N/A;  . VIDEO BRONCHOSCOPY WITH INSERTION OF INTERBRONCHIAL VALVE (IBV) N/A 08/02/2015   Procedure: VIDEO BRONCHOSCOPY WITH ATTEMPTED INSERTION OF INTERBRONCHIAL VALVE (IBV) WITH FLUROSCOPY;  Surgeon: Grace Isaac, MD;  Location: [facility];  Service: Thoracic;  Laterality: N/A;  . WISDOM TOOTH EXTRACTION      REVIEW OF SYSTEMS:  Constitutional: positive for fatigue Eyes: negative Ears, nose, mouth, throat, and face: negative Respiratory: positive for cough Cardiovascular: negative Gastrointestinal: positive for diarrhea Genitourinary:negative Integument/breast: negative Hematologic/lymphatic: negative Musculoskeletal:negative Neurological: negative Behavioral/Psych: negative Endocrine: negative Allergic/Immunologic:  negative   PHYSICAL EXAMINATION: General appearance: alert, cooperative, fatigued and no distress Head: Normocephalic, without obvious abnormality, atraumatic Neck: no adenopathy, no JVD, supple, symmetrical, trachea midline and thyroid not enlarged, symmetric, no tenderness/mass/nodules Lymph nodes: Cervical, supraclavicular, and axillary nodes normal. Resp: clear to auscultation bilaterally Back: symmetric, no curvature. ROM normal. No CVA tenderness. Cardio: regular rate and rhythm, S1, S2 normal, no murmur, click, rub or gallop GI: soft, non-tender; bowel sounds normal; no masses,  no organomegaly Extremities: extremities normal, atraumatic, no cyanosis or edema Neurologic: Alert and oriented X 3, normal strength and tone. Normal symmetric reflexes. Normal coordination and gait  ECOG PERFORMANCE STATUS: 1 - Symptomatic but completely ambulatory  Blood pressure (!) 157/81, pulse 84, temperature 98.2 F (36.8 C), temperature source Oral, resp. rate 18, height _0  (1.778 m), weight 106 lb 8 oz (48.3  kg), SpO2 97 %.  LABORATORY DATA: Lab Results  Component Value Date   WBC 9.4 03/31/2018   HGB 13.5 03/31/2018   HCT 40.0 03/31/2018   MCV 86.7 03/31/2018   PLT 257 03/31/2018      Chemistry      Component Value Date/Time   NA 141 03/25/2018 1434   NA 139 10/14/2017 1523   K 4.2 03/25/2018 1434   K 3.9 10/14/2017 1523   CL 107 03/25/2018 1434   CO2 25 03/25/2018 1434   CO2 24 10/14/2017 1523   BUN 28 (H) 03/25/2018 1434   BUN 27.2 (H) 10/14/2017 1523   CREATININE 0.99 03/25/2018 1434   CREATININE 0.9 10/14/2017 1523      Component Value Date/Time   CALCIUM 9.4 03/25/2018 1434   CALCIUM 9.5 10/14/2017 1523   ALKPHOS 99 03/25/2018 1434   ALKPHOS 111 10/14/2017 1523   AST 19 03/25/2018 1434   AST 21 10/14/2017 1523   ALT 18 03/25/2018 1434   ALT 20 10/14/2017 1523   BILITOT 0.3 03/25/2018 1434   BILITOT 0.26 10/14/2017 1523       RADIOGRAPHIC STUDIES: Dg Chest 1  View  Result Date: 03/04/2018 CLINICAL DATA:  History of lung surgery 2 and half years ago, short of breath, chest pain, smoking history EXAM: CHEST  1 VIEW COMPARISON:  CT chest of 01/03/2017 and chest x-ray of 11/26/2016 FINDINGS: There is opacity within the right upper hemithorax consistent with scarring with retraction of trachea toward the right upper hemithorax. Within the aerated right lung at the lung base there is vague opacity present probably representing scarring but attention to this area on follow-up chest x-ray is recommended. Some blunting of the right costophrenic angle appears chronic. The left lung is somewhat hyperaerated. There are 2 vague nodular opacities in the left upper lung which may have been present previously and, compared to the prior CT chest, most likely represent scarring. Heart is within normal limits in size. No bony abnormality is noted. IMPRESSION: 1. Postoperative changes on the right with opacity of the right upper and mid hemithorax with retraction of the trachea toward the right apex. 2. Small opacity in the aerated right lung base possible scarring when compared to prior CT chest, but recommend attention to this area on follow-up chest x-ray. 3. Probable scarring in the left upper lung when compared to the prior CT chest. Electronically Signed   By: Ivar Drape M.D.   On: 03/04/2018 09:15    ASSESSMENT AND PLAN: This is a very pleasant 77 years old white male with metastatic non-small cell lung cancer, adenocarcinoma status post short course of concurrent chemoradiation with weekly carboplatin and paclitaxel followed by 1 cycle of consolidation chemotherapy discontinued secondary to intolerance. He was a started on treatment with immunotherapy with Nivolumab 240 mg IV every 2 weeks status post 35 cycles.  He tolerated the previous treatment well with no significant adverse effects. His treatment was switched to a Nivolumab 480 mg IV every 4 weeks status post 10  cycles.  He has been tolerating this treatment fairly well except for recent several episodes of diarrhea on daily basis. I recommended for the patient to hold his treatment for now. For the diarrhea, I will extend his taper dose of prednisone for the next 4 weeks.  He will be on 40 mg p.o. daily for 7 days followed by 30 mg p.o. daily for 7 days followed by 20 mg p.o. daily for 7 days followed by 10  mg p.o. daily for 7 days then the patient will have a follow-up appointment with me in 4 weeks for evaluation before resuming his treatment. For the lack of appetite and weight loss, I will start the patient on Marinol 2.5 mg p.o. twice daily. The patient was advised to call immediately if he has any concerning symptoms in the interval. The patient voices understanding of current disease status and treatment options and is in agreement with the current care plan. All questions were answered. The patient knows to call the clinic with any problems, questions or concerns. We can certainly see the patient much sooner if necessary.  Disclaimer: This note was dictated with voice recognition software. Similar sounding words can inadvertently be transcribed and may not be corrected upon review.

## 2018-03-31 NOTE — Telephone Encounter (Signed)
Scheduled appt per 6/3 los - gave patient aVS and calender per los.

## 2018-04-01 LAB — TSH: TSH: 0.622 u[IU]/mL (ref 0.320–4.118)

## 2018-04-03 ENCOUNTER — Telehealth: Payer: Self-pay | Admitting: Internal Medicine

## 2018-04-03 MED ORDER — FIRST-DUKES MOUTHWASH MT SUSP: 5.0000 mL | Freq: Four times a day (QID) | OROMUCOSAL | 6 refills | Status: DC

## 2018-04-03 NOTE — Telephone Encounter (Signed)
Spoke with patient. He is aware of MR's recs. Will go ahead and call in refill for him to [facility]. Nothing else needed at time of call.

## 2018-04-03 NOTE — Telephone Encounter (Signed)
Called and spoke to pt. Pt is requesting refill on magic mouthwash, as he still has some thrush present.  Pt states he is rinsing his mouth after each use with symbicort.  MR please advise if okay to refill.

## 2018-04-03 NOTE — Telephone Encounter (Signed)
Yes ok to refill magic mouth washh. Or you can do below. In fact do standing orders for 6 refills  # Oral thrush - For Oral thrush: Take Suspension (swish and swallow): 500,000 units 4 times/day for 5 days; swish in the mouth and retain for as long as possible (several minutes) before swallowing. IF nystatin is in back order: alternatives are  A) clotrimazole troche (throt lozenge) 10mg  dissolved and swish and swallow 5 times a day for 14 days. Or B  B) Miconazole 2% oral gel

## 2018-04-22 ENCOUNTER — Telehealth: Payer: Self-pay | Admitting: Medical Oncology

## 2018-04-22 ENCOUNTER — Other Ambulatory Visit: Payer: Self-pay | Admitting: Medical Oncology

## 2018-04-22 DIAGNOSIS — C3411 Malignant neoplasm of upper lobe, right bronchus or lung: Secondary | ICD-10-CM

## 2018-04-22 DIAGNOSIS — C3491 Malignant neoplasm of unspecified part of right bronchus or lung: Secondary | ICD-10-CM

## 2018-04-22 DIAGNOSIS — C3490 Malignant neoplasm of unspecified part of unspecified bronchus or lung: Secondary | ICD-10-CM

## 2018-04-22 NOTE — Telephone Encounter (Signed)
Pt called and stated he cannot tolerate oral barium for upcoming scans . Ok per [place].

## 2018-04-25 ENCOUNTER — Ambulatory Visit (HOSPITAL_COMMUNITY)
Admission: RE | Admit: 2018-04-25 | Discharge: 2018-04-25 | Disposition: A | Discharge status: Home / Self Care | Payer: 59 | Source: Ambulatory Visit | Attending: Internal Medicine | Admitting: Internal Medicine

## 2018-04-25 ENCOUNTER — Encounter (HOSPITAL_COMMUNITY): Payer: Self-pay | Admitting: Radiology

## 2018-04-25 DIAGNOSIS — I700 Atherosclerosis of aorta: Secondary | ICD-10-CM | POA: Diagnosis not present

## 2018-04-25 DIAGNOSIS — C7951 Secondary malignant neoplasm of bone: Secondary | ICD-10-CM | POA: Insufficient documentation

## 2018-04-25 DIAGNOSIS — C3490 Malignant neoplasm of unspecified part of unspecified bronchus or lung: Secondary | ICD-10-CM | POA: Diagnosis not present

## 2018-04-25 DIAGNOSIS — D491 Neoplasm of unspecified behavior of respiratory system: Secondary | ICD-10-CM | POA: Diagnosis not present

## 2018-04-25 DIAGNOSIS — J9819 Other pulmonary collapse: Secondary | ICD-10-CM | POA: Insufficient documentation

## 2018-04-25 DIAGNOSIS — R918 Other nonspecific abnormal finding of lung field: Secondary | ICD-10-CM | POA: Insufficient documentation

## 2018-04-25 MED ORDER — IOPAMIDOL (ISOVUE-300) INJECTION 61%
100.0000 mL | Freq: Once | INTRAVENOUS | Status: AC | PRN
  Administered 2018-04-25: 100 mL via INTRAVENOUS

## 2018-04-28 ENCOUNTER — Inpatient Hospital Stay: Payer: 59

## 2018-04-28 ENCOUNTER — Inpatient Hospital Stay: Payer: 59 | Attending: Oncology | Admitting: Internal Medicine

## 2018-04-28 ENCOUNTER — Telehealth: Payer: Self-pay | Admitting: Internal Medicine

## 2018-04-28 ENCOUNTER — Encounter: Payer: Self-pay | Admitting: Internal Medicine

## 2018-04-28 VITALS — BP 148/84 | HR 95 | Temp 97.5°F | Resp 18 | Ht 70.0 in | Wt 102.1 lb

## 2018-04-28 DIAGNOSIS — Z85028 Personal history of other malignant neoplasm of stomach: Secondary | ICD-10-CM | POA: Insufficient documentation

## 2018-04-28 DIAGNOSIS — Z5112 Encounter for antineoplastic immunotherapy: Secondary | ICD-10-CM

## 2018-04-28 DIAGNOSIS — Z79899 Other long term (current) drug therapy: Secondary | ICD-10-CM | POA: Insufficient documentation

## 2018-04-28 DIAGNOSIS — J9811 Atelectasis: Secondary | ICD-10-CM | POA: Insufficient documentation

## 2018-04-28 DIAGNOSIS — C7951 Secondary malignant neoplasm of bone: Secondary | ICD-10-CM

## 2018-04-28 DIAGNOSIS — K529 Noninfective gastroenteritis and colitis, unspecified: Secondary | ICD-10-CM

## 2018-04-28 DIAGNOSIS — R197 Diarrhea, unspecified: Secondary | ICD-10-CM | POA: Insufficient documentation

## 2018-04-28 DIAGNOSIS — M545 Low back pain: Secondary | ICD-10-CM | POA: Insufficient documentation

## 2018-04-28 DIAGNOSIS — K4090 Unilateral inguinal hernia, without obstruction or gangrene, not specified as recurrent: Secondary | ICD-10-CM | POA: Insufficient documentation

## 2018-04-28 DIAGNOSIS — Z5111 Encounter for antineoplastic chemotherapy: Secondary | ICD-10-CM

## 2018-04-28 DIAGNOSIS — Z923 Personal history of irradiation: Secondary | ICD-10-CM | POA: Diagnosis not present

## 2018-04-28 DIAGNOSIS — K5900 Constipation, unspecified: Secondary | ICD-10-CM | POA: Diagnosis not present

## 2018-04-28 DIAGNOSIS — F17210 Nicotine dependence, cigarettes, uncomplicated: Secondary | ICD-10-CM | POA: Diagnosis not present

## 2018-04-28 DIAGNOSIS — C3491 Malignant neoplasm of unspecified part of right bronchus or lung: Secondary | ICD-10-CM

## 2018-04-28 DIAGNOSIS — I700 Atherosclerosis of aorta: Secondary | ICD-10-CM | POA: Diagnosis not present

## 2018-04-28 DIAGNOSIS — I10 Essential (primary) hypertension: Secondary | ICD-10-CM | POA: Insufficient documentation

## 2018-04-28 DIAGNOSIS — R634 Abnormal weight loss: Secondary | ICD-10-CM | POA: Insufficient documentation

## 2018-04-28 DIAGNOSIS — Z9221 Personal history of antineoplastic chemotherapy: Secondary | ICD-10-CM | POA: Diagnosis not present

## 2018-04-28 DIAGNOSIS — C3411 Malignant neoplasm of upper lobe, right bronchus or lung: Secondary | ICD-10-CM | POA: Diagnosis present

## 2018-04-28 DIAGNOSIS — Z7189 Other specified counseling: Secondary | ICD-10-CM

## 2018-04-28 LAB — CBC WITH DIFFERENTIAL/PLATELET
BASOS ABS: 0.1 10*3/uL (ref 0.0–0.1)
BASOS PCT: 1 %
EOS ABS: 0 10*3/uL (ref 0.0–0.5)
EOS PCT: 0 %
HCT: 41.2 % (ref 38.4–49.9)
Hemoglobin: 13.5 g/dL (ref 13.0–17.1)
LYMPHS PCT: 5 %
Lymphs Abs: 0.4 10*3/uL — ABNORMAL LOW (ref 0.9–3.3)
MCH: 29.1 pg (ref 27.2–33.4)
MCHC: 32.8 g/dL (ref 32.0–36.0)
MCV: 88.9 fL (ref 79.3–98.0)
Monocytes Absolute: 0.3 10*3/uL (ref 0.1–0.9)
Monocytes Relative: 3 %
Neutro Abs: 8 10*3/uL — ABNORMAL HIGH (ref 1.5–6.5)
Neutrophils Relative %: 91 %
PLATELETS: 217 10*3/uL (ref 140–400)
RBC: 4.64 MIL/uL (ref 4.20–5.82)
RDW: 17 % — AB (ref 11.0–14.6)
WBC: 8.8 10*3/uL (ref 4.0–10.3)

## 2018-04-28 LAB — COMPREHENSIVE METABOLIC PANEL
ALBUMIN: 3.9 g/dL (ref 3.5–5.0)
ALK PHOS: 118 U/L (ref 38–126)
ALT: 15 U/L (ref 0–44)
AST: 13 U/L — ABNORMAL LOW (ref 15–41)
Anion gap: 9 (ref 5–15)
BILIRUBIN TOTAL: 0.4 mg/dL (ref 0.3–1.2)
BUN: 22 mg/dL (ref 8–23)
CALCIUM: 9.8 mg/dL (ref 8.9–10.3)
CO2: 29 mmol/L (ref 22–32)
Chloride: 107 mmol/L (ref 98–111)
Creatinine, Ser: 0.95 mg/dL (ref 0.61–1.24)
GFR calc Af Amer: >60 mL/min (ref >60)
GLUCOSE: 117 mg/dL — AB (ref 70–99)
POTASSIUM: 3.3 mmol/L — AB (ref 3.5–5.1)
SODIUM: 145 mmol/L (ref 135–145)
TOTAL PROTEIN: 7.2 g/dL (ref 6.5–8.1)

## 2018-04-28 MED ORDER — CYANOCOBALAMIN 1000 MCG/ML IJ SOLN
1000.0000 ug | Freq: Once | INTRAMUSCULAR | Status: AC
  Administered 2018-04-28: 1000 ug via INTRAMUSCULAR

## 2018-04-28 MED ORDER — TEMAZEPAM 15 MG PO CAPS: 15.0000 mg | ORAL_CAPSULE | Freq: Every evening | ORAL | 0 refills | Status: DC | PRN

## 2018-04-28 MED ORDER — FOLIC ACID 1 MG PO TABS: 1.0000 mg | ORAL_TABLET | Freq: Every day | ORAL | 4 refills | Status: AC

## 2018-04-28 MED ORDER — OXYCODONE-ACETAMINOPHEN 5-325 MG PO TABS: 1.0000 | ORAL_TABLET | Freq: Three times a day (TID) | ORAL | 0 refills | Status: DC | PRN

## 2018-04-28 MED ORDER — PROCHLORPERAZINE MALEATE 10 MG PO TABS: 10.0000 mg | ORAL_TABLET | Freq: Four times a day (QID) | ORAL | 0 refills | Status: AC | PRN

## 2018-04-28 MED ORDER — PREDNISONE 10 MG PO TABS: ORAL_TABLET | ORAL | 0 refills | Status: DC

## 2018-04-28 NOTE — Progress Notes (Signed)
START ON PATHWAY REGIMEN - Non-Small Cell Lung     A cycle is every 21 days:     Pemetrexed   **Always confirm dose/schedule in your pharmacy ordering system**  Patient Characteristics: Stage IV Metastatic, Nonsquamous, Second Line - Chemotherapy/Immunotherapy, PS = 0, 1, No Prior PD-1/PD-L1  Inhibitor or Prior PD-1/PD-L1 Inhibitor + Chemotherapy, and Not a Candidate for Immunotherapy AJCC T Category: T2a Current Disease Status: Distant Metastases AJCC N Category: N2 AJCC M Category: M1c AJCC 8 Stage Grouping: IVB Histology: Nonsquamous Cell ROS1 Rearrangement Status: Negative T790M Mutation Status: Not Applicable - EGFR Mutation Negative/Unknown Other Mutations/Biomarkers: No Other Actionable Mutations NTRK Gene Fusion Status: Negative PD-L1 Expression Status: Quantity Not Sufficient Chemotherapy/Immunotherapy LOT: Second Line Chemotherapy/Immunotherapy Molecular Targeted Therapy: Not Appropriate ALK Translocation Status: Negative EGFR Mutation Status: Negative/Wild Type BRAF V600E Mutation Status: Negative Performance Status: PS = 0, 1 Immunotherapy Candidate Status: Not a Candidate for Immunotherapy Prior Immunotherapy Status: Prior PD-1/PD-L1 Inhibitor + Chemotherapy Intent of Therapy: Non-Curative / Palliative Intent, Discussed with Patient

## 2018-04-28 NOTE — Telephone Encounter (Signed)
Appointments scheduled AVS/Calendar printed per 7/1 los °

## 2018-04-28 NOTE — Progress Notes (Signed)
[facility] [phone]   [phone]  OFFICE PROGRESS NOTE  Aura Dials, MD [place] [address]  DIAGNOSIS: Metastatic non-small cell lung cancer, adenocarcinoma initially diagnosed as unresectable stage IIIa (T2a, N2, M0) in July 2016.  Foundation One molecular studies reveal that he was positive for [place], [place], and STAG2 [id number]. He was negative for: EGFR, ALK, BRAF, MET, RET, ERBB2 and ROS1  PRIOR THERAPY: 1) Concurrent chemoradiation with chemotherapy in the form of weekly carboplatin for AUC 2 paclitaxel 45 mg/m given concurrent with radiation therapy. First cycle started 05/09/2015. Status post 7 cycles. 2) Consolidation chemotherapy with carboplatin for AUC of 5 on day 1 and gemcitabine 1000 MG/M2 on days 1 and 8 every 3 weeks. First dose 08/15/2015. Discontinued secondary to intolerance. 3)  Immunotherapy with Nivolumab 240 mg IV every 2 weeks, status post 35 cycles. 4) Immunotherapy with Nivolumab 480 mg IV every 4 weeks status post 10 cycles.  CURRENT THERAPY: Systemic chemotherapy with Alimta 500 mg/M2 every 3 weeks.  First dose May 14, 2018.  INTERVAL HISTORY: BZURIEL ROSKOS77y.o. male returns to the clinic today for follow-up visit accompanied by his daughter and son.  The patient continues to have several episodes of diarrhea.  His diarrhea has significantly improved when he was on a higher dose of prednisone but with the prednisone taper it started coming back and gets up to 5 times a day.  He is currently on 10 mg p.o. daily.  He denied having any current chest pain but has shortness of breath at baseline increased with exertion with mild cough and wheezing.  He denied having any hemoptysis.  He lost around 7 pounds since his last visit because of the persistent diarrhea.  He denied having any fever or chills.  He has no nausea, vomiting or constipation.  He is also complaining of insomnia  because of the prednisone and low back pain.  The patient had repeat CT scan of the chest, abdomen and pelvis performed recently and he is here for evaluation and discussion of his discuss results.  MEDICAL HISTORY: Past Medical History:  Diagnosis Date  . Cancer ([facility]    skin cancer  . Constipation    AFTER ANESTHESIA  . Difficulty sleeping   . History of nonmelanoma skin cancer   . Hypertension 10/30/2016  . Inguinal hernia   . Lung mass    right / HAS HAD RADIATION AND CHEMO FOR LUNG CANCER  . Pneumothorax on right 08/24/2015  . Productive cough    "DUE TO RADIATION"  . Radiation 05/11/15-06/21/15   NSCLCA/right lung  . Smoking 1/2 pack a day or less 09/17/2016  . Spontaneous pneumothorax MANY YRS AGO AND AGAIN 04/04/15   HISTORY OF (ONLY)  . Weight loss 04/01/2017    ALLERGIES:  is allergic to aleve [naproxen].  MEDICATIONS:  Current Outpatient Medications  Medication Sig Dispense Refill  . albuterol (PROVENTIL HFA) 108 (90 Base) MCG/ACT inhaler Inhale 2 puffs into the lungs every 6 (six) hours as needed for wheezing or shortness of breath.    . Arginine (RA L-ARGININE) 1000 MG TABS Take 500 mg by mouth daily.     . Ascorbic Acid (VITAMIN C) 1000 MG tablet Take 1,000 mg by mouth daily. Pt takes 2 508mtabs    . B Complex Vitamins (B COMPLEX PO) Take 1 capsule by mouth daily.     . Marland KitchenALCIUM CITRATE PO  Take 1 tablet by mouth daily.    . Cyanocobalamin (VITAMIN B 12 PO) Take 1 tablet by mouth daily.    . Diphenhyd-Hydrocort-Nystatin (FIRST-DUKES MOUTHWASH) SUSP Take 5 mLs by mouth QID. 110 mL 6  . dronabinol (MARINOL) 2.5 MG capsule Take 1 capsule (2.5 mg total) by mouth 2 (two) times daily before a meal. 60 capsule 0  . ferrous sulfate 325 (65 FE) MG tablet Take 325 mg by mouth daily with breakfast.    . guaiFENesin (MUCINEX) 600 MG 12 hr tablet Take 1 tablet (600 mg total) by mouth 2 (two) times daily as needed. (Patient taking differently: Take 1,200 mg by mouth 2 (two) times  daily as needed. )    . ibuprofen (ADVIL,MOTRIN) 200 MG tablet Take 200 mg by mouth 3 (three) times daily.    . magnesium 30 MG tablet Take 30 mg by mouth daily.    . Multiple Vitamin (MULTI VITAMIN DAILY PO) Take 1 tablet by mouth daily.    . Omega-3 Fatty Acids (FISH OIL) 1000 MG CAPS Take 1,000 mg by mouth daily.    . predniSONE (DELTASONE) 10 MG tablet 4 tablets p.o. daily for 7 days followed by 3 tablets p.o. daily for 7 days followed by 2 tablets p.o. daily for 7 days followed by 1 tablet p.o. daily for 7 days. 70 tablet 0  . saccharomyces boulardii (FLORASTOR) 250 MG capsule Take 250 mg by mouth 2 (two) times daily.    . SYMBICORT 160-4.5 MCG/ACT inhaler INHALE 2 PUFFS TWICE DAILY 10.2 Inhaler 5  . vitamin A 10000 UNIT capsule 1 capsule with food or milk     No current facility-administered medications for this visit.     SURGICAL HISTORY:  Past Surgical History:  Procedure Laterality Date  . CHEST TUBE INSERTION Right 08/02/2015   Procedure: INSERTION OF SECOND RIGHT CHEST TUBE WITH FLUROSCOPY;  Surgeon: Grace Isaac, MD;  Location: [facility];  Service: Thoracic;  Laterality: Right;  . CHEST TUBE INSERTION  07/28/2015  . COLONOSCOPY W/ BIOPSIES AND POLYPECTOMY    . HERNIA REPAIR  2011  . INGUINAL HERNIA REPAIR Left 07/15/2015   Procedure: OPEN LEFT INGUINAL HERNIA REPAIR;  Surgeon: Johnathan Hausen, MD;  Location: [facility];  Service: General;  Laterality: Left;  With MESH  . TONSILLECTOMY    . VIDEO BRONCHOSCOPY WITH ENDOBRONCHIAL ULTRASOUND N/A 04/18/2015   Procedure: VIDEO BRONCHOSCOPY WITH ENDOBRONCHIAL ULTRASOUND;  Surgeon: Grace Isaac, MD;  Location: [facility];  Service: Thoracic;  Laterality: N/A;  . VIDEO BRONCHOSCOPY WITH INSERTION OF INTERBRONCHIAL VALVE (IBV) N/A 08/02/2015   Procedure: VIDEO BRONCHOSCOPY WITH ATTEMPTED INSERTION OF INTERBRONCHIAL VALVE (IBV) WITH FLUROSCOPY;  Surgeon: Grace Isaac, MD;  Location: [facility];  Service: Thoracic;  Laterality: N/A;  . WISDOM  TOOTH EXTRACTION      REVIEW OF SYSTEMS:  Constitutional: positive for fatigue and weight loss Eyes: negative Ears, nose, mouth, throat, and face: negative Respiratory: positive for cough, dyspnea on exertion and wheezing Cardiovascular: negative Gastrointestinal: positive for diarrhea Genitourinary:negative Integument/breast: negative Hematologic/lymphatic: negative Musculoskeletal:positive for back pain Neurological: negative Behavioral/Psych: positive for sleep disturbance Endocrine: negative Allergic/Immunologic: negative   PHYSICAL EXAMINATION: General appearance: alert, cooperative, fatigued and no distress Head: Normocephalic, without obvious abnormality, atraumatic Neck: no adenopathy, no JVD, supple, symmetrical, trachea midline and thyroid not enlarged, symmetric, no tenderness/mass/nodules Lymph nodes: Cervical, supraclavicular, and axillary nodes normal. Resp: clear to auscultation bilaterally Back: symmetric, no curvature. ROM normal. No CVA tenderness. Cardio: regular rate and rhythm,  S1, S2 normal, no murmur, click, rub or gallop GI: soft, non-tender; bowel sounds normal; no masses,  no organomegaly Extremities: extremities normal, atraumatic, no cyanosis or edema Neurologic: Alert and oriented X 3, normal strength and tone. Normal symmetric reflexes. Normal coordination and gait  ECOG PERFORMANCE STATUS: 1 - Symptomatic but completely ambulatory  Blood pressure (!) 148/84, pulse 95, temperature (!) 97.5 F (36.4 C), temperature source Oral, resp. rate 18, height _0  (1.778 m), weight 102 lb 1.6 oz (46.3 kg), SpO2 100 %.  LABORATORY DATA: Lab Results  Component Value Date   WBC 8.8 04/28/2018   HGB 13.5 04/28/2018   HCT 41.2 04/28/2018   MCV 88.9 04/28/2018   PLT 217 04/28/2018      Chemistry      Component Value Date/Time   NA 139 03/31/2018 1521   NA 139 10/14/2017 1523   K 3.6 03/31/2018 1521   K 3.9 10/14/2017 1523   CL 103 03/31/2018 1521    CO2 25 03/31/2018 1521   CO2 24 10/14/2017 1523   BUN 24 03/31/2018 1521   BUN 27.2 (H) 10/14/2017 1523   CREATININE 0.93 03/31/2018 1521   CREATININE 0.99 03/25/2018 1434   CREATININE 0.9 10/14/2017 1523      Component Value Date/Time   CALCIUM 9.2 03/31/2018 1521   CALCIUM 9.5 10/14/2017 1523   ALKPHOS 111 03/31/2018 1521   ALKPHOS 111 10/14/2017 1523   AST 14 03/31/2018 1521   AST 19 03/25/2018 1434   AST 21 10/14/2017 1523   ALT 15 03/31/2018 1521   ALT 18 03/25/2018 1434   ALT 20 10/14/2017 1523   BILITOT 0.3 03/31/2018 1521   BILITOT 0.3 03/25/2018 1434   BILITOT 0.26 10/14/2017 1523       RADIOGRAPHIC STUDIES: Ct Chest W Contrast  Result Date: 04/28/2018 CLINICAL DATA:  Lung cancer diagnosed in 2016. Chemotherapy and radiation therapy completed. Immunotherapy ongoing. EXAM: CT CHEST, ABDOMEN, AND PELVIS WITH CONTRAST TECHNIQUE: Multidetector CT imaging of the chest, abdomen and pelvis was performed following the standard protocol during bolus administration of intravenous contrast. CONTRAST:  142m ISOVUE-300 IOPAMIDOL (ISOVUE-300) INJECTION 61% COMPARISON:  CT 12/2017 and 10/11/2017. FINDINGS: CT CHEST FINDINGS Cardiovascular: Again demonstrated is atherosclerosis of the aorta, great vessels and coronary arteries. No acute vascular findings are seen. There are aortic valvular calcifications. The heart size is normal. There is no pericardial effusion. Mediastinum/Nodes: There are no enlarged mediastinal, hilar or axillary lymph nodes. Previously noted right paratracheal node is smaller, measuring 8 mm on image 23/2 (previously 10 mm). Lungs/Pleura: There is no significant pleural effusion. There is chronic volume loss in the right upper lobe with associated bronchiectasis and air bronchograms. There is increasing nodular enhancement throughout the right pleural space superiorly consistent with progressive tumor. There are areas of probable chest wall extension (axial image 17/2 and  coronal image 65/5). There is new complete collapse of the right lower lobe. Part solid left lower lobe nodule is slightly smaller, measuring 18 x 13 mm on image 136/4 (previously 19 x 19 mm). The other left lung nodules have not significantly changed, including a 7 mm superior segment left lower lobe nodule (image 60/4) and a 14 mm left upper lobe nodule (image 23/4). Moderate underlying centrilobular and paraseptal emphysema again noted. Musculoskeletal/Chest wall: As above, there is increasing tumor within the right pleural space superiorly. The right paraspinal mass at T11-12 is similar, measuring 2.8 x 2.5 cm on image 47/2. This demonstrates right neural foraminal extension, most obvious  on the coronal images. Osseous metastasis within the right aspect of the T11 vertebral body has not significantly changed. No evidence of pathologic fracture. CT ABDOMEN AND PELVIS FINDINGS Hepatobiliary: The liver is normal in density without focal abnormality. The gallbladder appears normal. There is stable mild extrahepatic biliary dilatation. Pancreas: Stable atrophy and mild ductal dilatation. No mass lesion or surrounding inflammation. Spleen: Normal in size without focal abnormality. Adrenals/Urinary Tract: Stable low-density 2.0 cm right adrenal adenoma (image 56/2). The left adrenal gland appears normal. There is a small nonobstructing calculus in the upper pole of the right kidney. No evidence of ureteral calculus or hydronephrosis. There is mildly heterogeneous enhancement in the upper pole of the right kidney which may be inflammatory. The left kidney and bladder appear unremarkable. Stomach/Bowel: No evidence of bowel wall thickening, distention or surrounding inflammatory change. Vascular/Lymphatic: There are no enlarged abdominal or pelvic lymph nodes. Diffuse aortic and branch vessel atherosclerosis again noted. No acute vascular findings. Reproductive: Similar appearance of the prostate gland with central  heterogeneity and enlargement. Other: No evidence of abdominal wall mass or hernia. No ascites. Musculoskeletal: Enlarging lytic metastasis within the right aspect of the L1 vertebral body has an extra osseous component and measures up to 2.2 x 1.5 cm on image 56/2. Scattered sclerotic metastases are similar, most notable in the right iliac bone and left sacrum. Thoracolumbar scoliosis again noted. IMPRESSION: 1. 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 The right lower paraspinal metastasis has not significantly changed although demonstrates persistent neural foraminal extension. 2. New complete collapse of the right lower lobe, obscuring the previously demonstrated enlarging right lung nodules. Most of the left lung nodules have not significantly changed, although a part solid left lower lobe nodule is slightly smaller. 3. Slight progression of osseous metastatic disease, especially at L1. No evidence of pathologic fracture or significant intraspinal tumor. 4. No evidence of extra osseous metastatic disease in the abdomen or pelvis. 5. Possible inflammation in the upper pole of the right kidney without evidence of collecting system obstruction or abscess. 6.  Aortic Atherosclerosis (ICD10-I70.0). Electronically Signed   By: Richardean Sale M.D.   On: 04/28/2018 09:57   Ct Abdomen Pelvis W Contrast  Result Date: 04/28/2018 CLINICAL DATA:  Lung cancer diagnosed in 2016. Chemotherapy and radiation therapy completed. Immunotherapy ongoing. EXAM: CT CHEST, ABDOMEN, AND PELVIS WITH CONTRAST TECHNIQUE: Multidetector CT imaging of the chest, abdomen and pelvis was performed following the standard protocol during bolus administration of intravenous contrast. CONTRAST:  120m ISOVUE-300 IOPAMIDOL (ISOVUE-300) INJECTION 61% COMPARISON:  CT 12/2017 and 10/11/2017. FINDINGS: CT CHEST FINDINGS Cardiovascular: Again demonstrated is atherosclerosis of the aorta, great vessels and  coronary arteries. No acute vascular findings are seen. There are aortic valvular calcifications. The heart size is normal. There is no pericardial effusion. Mediastinum/Nodes: There are no enlarged mediastinal, hilar or axillary lymph nodes. Previously noted right paratracheal node is smaller, measuring 8 mm on image 23/2 (previously 10 mm). Lungs/Pleura: There is no significant pleural effusion. There is chronic volume loss in the right upper lobe with associated bronchiectasis and air bronchograms. There is increasing nodular enhancement throughout the right pleural space superiorly consistent with progressive tumor. There are areas of probable chest wall extension (axial image 17/2 and coronal image 65/5). There is new complete collapse of the right lower lobe. Part solid left lower lobe nodule is slightly smaller, measuring 18 x 13 mm on image 136/4 (previously 19 x 19 mm). The other left lung  nodules have not significantly changed, including a 7 mm superior segment left lower lobe nodule (image 60/4) and a 14 mm left upper lobe nodule (image 23/4). Moderate underlying centrilobular and paraseptal emphysema again noted. Musculoskeletal/Chest wall: As above, there is increasing tumor within the right pleural space superiorly. The right paraspinal mass at T11-12 is similar, measuring 2.8 x 2.5 cm on image 47/2. This demonstrates right neural foraminal extension, most obvious on the coronal images. Osseous metastasis within the right aspect of the T11 vertebral body has not significantly changed. No evidence of pathologic fracture. CT ABDOMEN AND PELVIS FINDINGS Hepatobiliary: The liver is normal in density without focal abnormality. The gallbladder appears normal. There is stable mild extrahepatic biliary dilatation. Pancreas: Stable atrophy and mild ductal dilatation. No mass lesion or surrounding inflammation. Spleen: Normal in size without focal abnormality. Adrenals/Urinary Tract: Stable low-density 2.0 cm  right adrenal adenoma (image 56/2). The left adrenal gland appears normal. There is a small nonobstructing calculus in the upper pole of the right kidney. No evidence of ureteral calculus or hydronephrosis. There is mildly heterogeneous enhancement in the upper pole of the right kidney which may be inflammatory. The left kidney and bladder appear unremarkable. Stomach/Bowel: No evidence of bowel wall thickening, distention or surrounding inflammatory change. Vascular/Lymphatic: There are no enlarged abdominal or pelvic lymph nodes. Diffuse aortic and branch vessel atherosclerosis again noted. No acute vascular findings. Reproductive: Similar appearance of the prostate gland with central heterogeneity and enlargement. Other: No evidence of abdominal wall mass or hernia. No ascites. Musculoskeletal: Enlarging lytic metastasis within the right aspect of the L1 vertebral body has an extra osseous component and measures up to 2.2 x 1.5 cm on image 56/2. Scattered sclerotic metastases are similar, most notable in the right iliac bone and left sacrum. Thoracolumbar scoliosis again noted. IMPRESSION: 1. 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 The right lower paraspinal metastasis has not significantly changed although demonstrates persistent neural foraminal extension. 2. New complete collapse of the right lower lobe, obscuring the previously demonstrated enlarging right lung nodules. Most of the left lung nodules have not significantly changed, although a part solid left lower lobe nodule is slightly smaller. 3. Slight progression of osseous metastatic disease, especially at L1. No evidence of pathologic fracture or significant intraspinal tumor. 4. No evidence of extra osseous metastatic disease in the abdomen or pelvis. 5. Possible inflammation in the upper pole of the right kidney without evidence of collecting system obstruction or abscess. 6.  Aortic Atherosclerosis  (ICD10-I70.0). Electronically Signed   By: Richardean Sale M.D.   On: 04/28/2018 09:57    ASSESSMENT AND PLAN: This is a very pleasant 77 years old white male with metastatic non-small cell lung cancer, adenocarcinoma status post short course of concurrent chemoradiation with weekly carboplatin and paclitaxel followed by 1 cycle of consolidation chemotherapy discontinued secondary to intolerance. He was a started on treatment with immunotherapy with Nivolumab 240 mg IV every 2 weeks status post 35 cycles.  He tolerated the previous treatment well with no significant adverse effects. His treatment was switched to a Nivolumab 480 mg IV every 4 weeks status post 10 cycles.   He has been tolerating this treatment well except for recent episodes of diarrhea the last few weeks.  He was a started on high-dose prednisone that was tapered over the last few weeks.  He has initial improvement in the diarrhea then he has relapse. I recommended for the patient to start high-dose prednisone  again for his persistent diarrhea.  I will also check stool for C. difficile today.  I will refer the patient to gastroenterology for evaluation. The patient had repeat CT scan of the chest, abdomen and pelvis performed recently.  His a scan showed further evidence for disease progression of the pleural tumor superiorly in the right hemithorax with chest wall invasion as well as slight progression of the osseous metastatic disease. I personally and independently reviewed the scan images and discussed the result and showed the images to the patient and his family today. I recommended for the patient to discontinue his current treatment with immunotherapy because of the disease progression as well as the persistent diarrhea. I discussed with the patient other treatment options including second line treatment with single agent Alimta 500 mg/M2 every 3 weeks.  I discussed with the patient the adverse effect of this treatment including  but not limited to alopecia, myelosuppression, nausea and vomiting, peripheral neuropathy, liver or renal dysfunction.  I also provided the patient with handout about Alimta.  He would like to proceed with the treatment with Alimta.  He is expected to start the first cycle of this treatment on 05/14/2018. For the low back pain, I started the patient on Percocet 5/325 mg p.o. every 8 hours as needed for pain. For the insomnia is started him on Restoril 15 mg p.o. nightly as needed. The patient will come back for follow-up visit in 2 weeks for evaluation before starting the first cycle of his treatment. He received a vitamin B12 injection today and I will send prescription for folic acid and Compazine to his pharmacy. He was advised to call immediately if he has any concerning symptoms in the interval. The patient voices understanding of current disease status and treatment options and is in agreement with the current care plan. All questions were answered. The patient knows to call the clinic with any problems, questions or concerns. We can certainly see the patient much sooner if necessary.  Disclaimer: This note was dictated with voice recognition software. Similar sounding words can inadvertently be transcribed and may not be corrected upon review.

## 2018-04-28 NOTE — Patient Instructions (Signed)
Cyanocobalamin, Vitamin B12 injection What is this medicine? CYANOCOBALAMIN (sye an oh koe BAL a min) is a man made form of vitamin B12. Vitamin B12 is used in the growth of healthy blood cells, nerve cells, and proteins in the body. It also helps with the metabolism of fats and carbohydrates. This medicine is used to treat people who can not absorb vitamin B12. This medicine may be used for other purposes; ask your health care provider or pharmacist if you have questions. COMMON BRAND NAME(S): B-12 Compliance Kit, B-12 Injection Kit, Cyomin, LA-12, Nutri-Twelve, Physicians EZ Use B-12, Primabalt What should I tell my health care provider before I take this medicine? They need to know if you have any of these conditions: -kidney disease -Leber's disease -megaloblastic anemia -an unusual or allergic reaction to cyanocobalamin, cobalt, other medicines, foods, dyes, or preservatives -pregnant or trying to get pregnant -breast-feeding How should I use this medicine? This medicine is injected into a muscle or deeply under the skin. It is usually given by a health care professional in a clinic or doctor's office. However, your doctor may teach you how to inject yourself. Follow all instructions. Talk to your pediatrician regarding the use of this medicine in children. Special care may be needed. Overdosage: If you think you have taken too much of this medicine contact a poison control center or emergency room at once. NOTE: This medicine is only for you. Do not share this medicine with others. What if I miss a dose? If you are given your dose at a clinic or doctor's office, call to reschedule your appointment. If you give your own injections and you miss a dose, take it as soon as you can. If it is almost time for your next dose, take only that dose. Do not take double or extra doses. What may interact with this medicine? -colchicine -heavy alcohol intake This list may not describe all possible  interactions. Give your health care provider a list of all the medicines, herbs, non-prescription drugs, or dietary supplements you use. Also tell them if you smoke, drink alcohol, or use illegal drugs. Some items may interact with your medicine. What should I watch for while using this medicine? Visit your doctor or health care professional regularly. You may need blood work done while you are taking this medicine. You may need to follow a special diet. Talk to your doctor. Limit your alcohol intake and avoid smoking to get the best benefit. What side effects may I notice from receiving this medicine? Side effects that you should report to your doctor or health care professional as soon as possible: -allergic reactions like skin rash, itching or hives, swelling of the face, lips, or tongue -blue tint to skin -chest tightness, pain -difficulty breathing, wheezing -dizziness -red, swollen painful area on the leg Side effects that usually do not require medical attention (report to your doctor or health care professional if they continue or are bothersome): -diarrhea -headache This list may not describe all possible side effects. Call your doctor for medical advice about side effects. You may report side effects to FDA at 1-800-FDA-1088. Where should I keep my medicine? Keep out of the reach of children. Store at room temperature between 15 and 30 degrees C (59 and 85 degrees F). Protect from light. Throw away any unused medicine after the expiration date. NOTE: This sheet is a summary. It may not cover all possible information. If you have questions about this medicine, talk to your doctor, pharmacist, or   health care provider.  2018 Elsevier/Gold Standard (2008-01-26 22:10:20)  

## 2018-04-29 ENCOUNTER — Telehealth: Payer: Self-pay | Admitting: *Deleted

## 2018-04-29 LAB — TSH: TSH: 0.584 u[IU]/mL (ref 0.320–4.118)

## 2018-04-29 NOTE — Telephone Encounter (Signed)
Received voice mail message from patient stating,"Dr. Julien Nordmann started me on Folic acid yesterday. He told me to take one pill before the day before treatment, one day of treatment, and one the day after. The instructions on the bottle say take one daily. Can you please verify the directions. Return number is [phone].

## 2018-04-29 NOTE — Telephone Encounter (Signed)
Folic acid 1 tablet p.o. daily every day.  Decadron will be prescribed in the future 1 tablet p.o. twice daily the day before, day of and day after chemotherapy.  I did not prescribe it at this time because he will be on prednisone.  Thank you

## 2018-04-29 NOTE — Telephone Encounter (Signed)
As noted below by Dr. Julien Nordmann, I informed the patient that Folic acid should be one tablet daily. He verbalized understanding.

## 2018-04-30 ENCOUNTER — Telehealth: Payer: Self-pay | Admitting: *Deleted

## 2018-04-30 ENCOUNTER — Inpatient Hospital Stay: Payer: 59

## 2018-04-30 DIAGNOSIS — C3491 Malignant neoplasm of unspecified part of right bronchus or lung: Secondary | ICD-10-CM

## 2018-04-30 DIAGNOSIS — R197 Diarrhea, unspecified: Secondary | ICD-10-CM

## 2018-04-30 DIAGNOSIS — C3411 Malignant neoplasm of upper lobe, right bronchus or lung: Secondary | ICD-10-CM | POA: Diagnosis not present

## 2018-04-30 LAB — C DIFFICILE QUICK SCREEN W PCR REFLEX
C DIFFICILE (CDIFF) TOXIN: NEGATIVE
C Diff antigen: NEGATIVE
C Diff interpretation: NOT DETECTED

## 2018-04-30 NOTE — Telephone Encounter (Signed)
Faxed ROI to [facility], attnYork Grice; release [phone]

## 2018-05-02 ENCOUNTER — Telehealth: Payer: Self-pay | Admitting: Medical Oncology

## 2018-05-02 NOTE — Telephone Encounter (Signed)
Records requested. Call transferred to HIM.

## 2018-05-14 ENCOUNTER — Encounter: Payer: Self-pay | Admitting: Nurse Practitioner

## 2018-05-14 ENCOUNTER — Inpatient Hospital Stay: Payer: 59

## 2018-05-14 ENCOUNTER — Inpatient Hospital Stay (HOSPITAL_BASED_OUTPATIENT_CLINIC_OR_DEPARTMENT_OTHER): Payer: 59 | Admitting: Nurse Practitioner

## 2018-05-14 VITALS — BP 147/84 | HR 96 | Temp 97.8°F | Resp 18 | Ht 70.0 in | Wt 100.2 lb

## 2018-05-14 DIAGNOSIS — F17210 Nicotine dependence, cigarettes, uncomplicated: Secondary | ICD-10-CM

## 2018-05-14 DIAGNOSIS — Z85028 Personal history of other malignant neoplasm of stomach: Secondary | ICD-10-CM

## 2018-05-14 DIAGNOSIS — R197 Diarrhea, unspecified: Secondary | ICD-10-CM | POA: Diagnosis not present

## 2018-05-14 DIAGNOSIS — Z79899 Other long term (current) drug therapy: Secondary | ICD-10-CM

## 2018-05-14 DIAGNOSIS — C7951 Secondary malignant neoplasm of bone: Secondary | ICD-10-CM | POA: Diagnosis not present

## 2018-05-14 DIAGNOSIS — M545 Low back pain: Secondary | ICD-10-CM

## 2018-05-14 DIAGNOSIS — R5383 Other fatigue: Secondary | ICD-10-CM

## 2018-05-14 DIAGNOSIS — C3491 Malignant neoplasm of unspecified part of right bronchus or lung: Secondary | ICD-10-CM

## 2018-05-14 DIAGNOSIS — Z5112 Encounter for antineoplastic immunotherapy: Secondary | ICD-10-CM

## 2018-05-14 DIAGNOSIS — C3411 Malignant neoplasm of upper lobe, right bronchus or lung: Secondary | ICD-10-CM

## 2018-05-14 DIAGNOSIS — Z5111 Encounter for antineoplastic chemotherapy: Secondary | ICD-10-CM

## 2018-05-14 DIAGNOSIS — I10 Essential (primary) hypertension: Secondary | ICD-10-CM

## 2018-05-14 DIAGNOSIS — R634 Abnormal weight loss: Secondary | ICD-10-CM

## 2018-05-14 DIAGNOSIS — K5900 Constipation, unspecified: Secondary | ICD-10-CM

## 2018-05-14 DIAGNOSIS — K4090 Unilateral inguinal hernia, without obstruction or gangrene, not specified as recurrent: Secondary | ICD-10-CM

## 2018-05-14 DIAGNOSIS — J9811 Atelectasis: Secondary | ICD-10-CM

## 2018-05-14 DIAGNOSIS — Z9221 Personal history of antineoplastic chemotherapy: Secondary | ICD-10-CM

## 2018-05-14 DIAGNOSIS — I700 Atherosclerosis of aorta: Secondary | ICD-10-CM

## 2018-05-14 DIAGNOSIS — Z923 Personal history of irradiation: Secondary | ICD-10-CM

## 2018-05-14 LAB — COMPREHENSIVE METABOLIC PANEL
ALT: 17 U/L (ref 0–44)
AST: 12 U/L — ABNORMAL LOW (ref 15–41)
Albumin: 3.8 g/dL (ref 3.5–5.0)
Alkaline Phosphatase: 101 U/L (ref 38–126)
Anion gap: 11 (ref 5–15)
BUN: 25 mg/dL — ABNORMAL HIGH (ref 8–23)
CHLORIDE: 102 mmol/L (ref 98–111)
CO2: 26 mmol/L (ref 22–32)
Calcium: 9.4 mg/dL (ref 8.9–10.3)
Creatinine, Ser: 0.98 mg/dL (ref 0.61–1.24)
Glucose, Bld: 147 mg/dL — ABNORMAL HIGH (ref 70–99)
POTASSIUM: 3.8 mmol/L (ref 3.5–5.1)
Sodium: 139 mmol/L (ref 135–145)
Total Bilirubin: 0.4 mg/dL (ref 0.3–1.2)
Total Protein: 6.8 g/dL (ref 6.5–8.1)

## 2018-05-14 LAB — CBC WITH DIFFERENTIAL/PLATELET
Basophils Absolute: 0 10*3/uL (ref 0.0–0.1)
Basophils Relative: 0 %
Eosinophils Absolute: 0 10*3/uL (ref 0.0–0.5)
Eosinophils Relative: 0 %
HCT: 43.7 % (ref 38.4–49.9)
HEMOGLOBIN: 14.3 g/dL (ref 13.0–17.1)
LYMPHS ABS: 0.3 10*3/uL — AB (ref 0.9–3.3)
LYMPHS PCT: 2 %
MCH: 29.9 pg (ref 27.2–33.4)
MCHC: 32.7 g/dL (ref 32.0–36.0)
MCV: 91.2 fL (ref 79.3–98.0)
Monocytes Absolute: 0.2 10*3/uL (ref 0.1–0.9)
Monocytes Relative: 1 %
NEUTROS PCT: 97 %
Neutro Abs: 12.2 10*3/uL — ABNORMAL HIGH (ref 1.5–6.5)
Platelets: 193 10*3/uL (ref 140–400)
RBC: 4.79 MIL/uL (ref 4.20–5.82)
RDW: 16.7 % — ABNORMAL HIGH (ref 11.0–14.6)
WBC: 12.7 10*3/uL — AB (ref 4.0–10.3)

## 2018-05-14 MED ORDER — SODIUM CHLORIDE 0.9 % IV SOLN
Freq: Once | INTRAVENOUS | Status: AC
  Administered 2018-05-14: 16:00:00 via INTRAVENOUS

## 2018-05-14 MED ORDER — DEXAMETHASONE 4 MG PO TABS: ORAL_TABLET | ORAL | 0 refills | Status: DC

## 2018-05-14 MED ORDER — OXYCODONE-ACETAMINOPHEN 5-325 MG PO TABS: 1.0000 | ORAL_TABLET | Freq: Three times a day (TID) | ORAL | 0 refills | Status: DC | PRN

## 2018-05-14 MED ORDER — PROCHLORPERAZINE MALEATE 10 MG PO TABS
10.0000 mg | ORAL_TABLET | Freq: Once | ORAL | Status: AC
  Administered 2018-05-14: 10 mg via ORAL

## 2018-05-14 MED ORDER — SODIUM CHLORIDE 0.9 % IV SOLN
530.0000 mg/m2 | Freq: Once | INTRAVENOUS | Status: AC
  Administered 2018-05-14: 800 mg via INTRAVENOUS
  Filled 2018-05-14: qty 12

## 2018-05-14 MED ORDER — PROCHLORPERAZINE MALEATE 10 MG PO TABS
ORAL_TABLET | ORAL | Status: AC
  Filled 2018-05-14: qty 1

## 2018-05-14 NOTE — Progress Notes (Addendum)
[place] OFFICE PROGRESS NOTE   DIAGNOSIS: Metastatic non-small cell lung cancer, adenocarcinoma initially diagnosed as unresectable stage IIIa (T2a, N2, M0) in July 2016.  Foundation One molecular studies reveal that he was positive for [place], [place], and STAG2 [id number]. He was negative for: EGFR, ALK, BRAF, MET, RET, ERBB2 and ROS1  PRIOR THERAPY: 1) Concurrent chemoradiation with chemotherapy in the form of weekly carboplatin for AUC 2 paclitaxel 45 mg/m given concurrent with radiation therapy. First cycle started 05/09/2015. Status post 7 cycles. 2) Consolidation chemotherapy with carboplatin for AUC of 5 on day 1 and gemcitabine 1000 MG/M2 on days 1 and 8 every 3 weeks. First dose 08/15/2015. Discontinued secondary to intolerance. 3)  Immunotherapy with Nivolumab 240 mg IV every 2 weeks, status post 35 cycles. 4) Immunotherapy with Nivolumab 480 mg IV every 4 weeks status post 10 cycles.  CURRENT THERAPY: Systemic chemotherapy with Alimta 500 mg/M2 every 3 weeks.  First dose May 14, 2018.   INTERVAL HISTORY:   Mr. OWaagereturns as scheduled.  He noted a poor energy level yesterday.  Diarrhea is better.  He estimates 2 bowel movements a day.  He has tapered the prednisone to 20 mg daily.  He is having difficulty sleeping.  He thinks this is in part due to pain at the right posterior chest/scapular region.  He takes an oxycodone tablet at bedtime and is able to sleep for about 4 hours before the pain wakes him up.  No change in baseline dyspnea on exertion.  No cough.  No fever.  Objective:  Vital signs in last 24 hours:  Blood pressure (!) 147/84, pulse 96, temperature 97.8 F (36.6 C), temperature source Oral, resp. rate 18, height 5' 10"  (1.778 m), weight 100 lb 3.2 oz (45.5 kg), SpO2 100 %.    HEENT: No thrush or ulcers. Resp: Distant breath sounds. Cardio: Regular rate and rhythm. GI: Abdomen soft and nontender.  No  hepatomegaly. Vascular: No leg edema. Neuro: Alert and oriented. Skin: Ecchymoses over the lower forearms.   Lab Results:  Lab Results  Component Value Date   WBC 12.7 (H) 05/14/2018   HGB 14.3 05/14/2018   HCT 43.7 05/14/2018   MCV 91.2 05/14/2018   PLT 193 05/14/2018   NEUTROABS 12.2 (H) 05/14/2018    Imaging:  No results found.  Medications: I have reviewed the patient's current medications.  Assessment/Plan: 1. Metastatic non-small cell lung cancer, adenocarcinoma, status post short course of concurrent chemoradiation with weekly carboplatin and Taxol followed by 1 cycle of consolidation chemotherapy discontinued secondary to intolerance.  Treated with immunotherapy with nivolumab every 2 weeks x 35 cycles.  Switched to nivolumab every 4 weeks status post 10 cycles.  Restaging CT evaluation 04/28/2018 with evidence of progression.  Scheduled to begin Alimta today. 2. Diarrhea currently on a prednisone taper.  Improved.  Disposition: Mr. OSermenowill proceed with cycle 1 Alimta today as scheduled.  He is taking folic acid 1 mg daily.  He is currently completing a prednisone taper for the diarrhea.  He will complete the prednisone taper over the next 2 weeks.  He understands to begin Decadron 4 mg twice daily the day before, day of and day after chemotherapy beginning with cycle 2.  He and his family understand the pain at the right posterior upper chest may be related to cancer.  He will continue Percocet as needed.  A new prescription was sent to his pharmacy today.  He will return for lab,  follow-up and cycle 2 Alimta in 3 weeks.  He will contact the office in the interim with any problems.  Patient seen with Dr. Julien Nordmann.  CT images reviewed on the computer with Mr. Demetro and his family.    Ned Card ANP/GNP-BC   05/14/2018  4:05 PM   ADDENDUM:  Hematology/Oncology Attending: I had a face-to-face encounter with the patient today.  I recommended his care plan.  This  is a very pleasant 77 years old white male with metastatic non-small cell lung cancer, adenocarcinoma.  He status post several regimens in the past including a course of concurrent chemoradiation followed by 1 cycle of consolidation treatment discontinued secondary to intolerance.  The patient was treated with second line treatment with immunotherapy with nivolumab discontinued recently secondary to intolerance with immunotherapy mediated diarrhea as well as disease progression.  The patient is currently on a taper dose of prednisone and he has improvement of his diarrhea. He is here today to start the first cycle of his systemic chemotherapy with single agent Alimta 500 mg/m2 IV every 3 weeks.  The patient and his family had several questions today about his current condition and the previous scan results as well as treatment options.  After a lengthy discussion of his options he decided to proceed with the first cycle of his treatment with Alimta today as scheduled. For the pain management in the right posterior upper chest pain, the patient will continue on Percocet as needed.  He was given refill of his pain medication. I will see him back for follow-up visit in 3 weeks for evaluation before starting cycle #2.  He was strongly advised to call immediately if he has any concerning symptoms in the interval especially any fever or chills.  Disclaimer: This note was dictated with voice recognition software. Similar sounding words can inadvertently be transcribed and may be missed upon review. Eilleen Kempf, MD 05/14/18

## 2018-05-14 NOTE — Patient Instructions (Signed)
[facility] Discharge Instructions for Patients Receiving Chemotherapy  Today you received the following chemotherapy agents Alimta.  To help prevent nausea and vomiting after your treatment, we encourage you to take your nausea medication as prescribed.   If you develop nausea and vomiting that is not controlled by your nausea medication, call the clinic.   BELOW ARE SYMPTOMS THAT SHOULD BE REPORTED IMMEDIATELY:  *FEVER GREATER THAN 100.5 F  *CHILLS WITH OR WITHOUT FEVER  NAUSEA AND VOMITING THAT IS NOT CONTROLLED WITH YOUR NAUSEA MEDICATION  *UNUSUAL SHORTNESS OF BREATH  *UNUSUAL BRUISING OR BLEEDING  TENDERNESS IN MOUTH AND THROAT WITH OR WITHOUT PRESENCE OF ULCERS  *URINARY PROBLEMS  *BOWEL PROBLEMS  UNUSUAL RASH Items with * indicate a potential emergency and should be followed up as soon as possible.  Feel free to call the clinic should you have any questions or concerns. The clinic phone number is [phone].  Please show the [facility] at check-in to the Emergency Department and triage nurse.

## 2018-05-19 ENCOUNTER — Telehealth: Payer: Self-pay | Admitting: Medical Oncology

## 2018-05-19 NOTE — Telephone Encounter (Signed)
Diarrhea- increase in past 2 days -No energy to walk across room. He said he was drinking plenty of fluids.He is taking Prednisone taper and now down to 20 mg /day . Diarrhea was less when he was on 30 mg/day. Per Julien Nordmann may go back up to Prednisone 30 mg/day . GI appt wed. Pt stated he has enough prednisone.

## 2018-05-21 ENCOUNTER — Other Ambulatory Visit: Payer: Self-pay | Admitting: Medical Oncology

## 2018-05-21 ENCOUNTER — Ambulatory Visit: Payer: 59

## 2018-05-21 ENCOUNTER — Telehealth: Payer: Self-pay | Admitting: Medical Oncology

## 2018-05-21 DIAGNOSIS — R197 Diarrhea, unspecified: Secondary | ICD-10-CM

## 2018-05-21 DIAGNOSIS — K529 Noninfective gastroenteritis and colitis, unspecified: Secondary | ICD-10-CM

## 2018-05-21 MED ORDER — PREDNISONE 10 MG PO TABS: ORAL_TABLET | ORAL | 0 refills | Status: DC

## 2018-05-21 NOTE — Telephone Encounter (Signed)
Prednisone reorderd

## 2018-05-21 NOTE — Progress Notes (Signed)
Called in rx prednisone

## 2018-05-22 ENCOUNTER — Ambulatory Visit: Payer: 59

## 2018-05-22 NOTE — Progress Notes (Signed)
FMLA successfully faxed to Domenic Polite, RN at [phone]. Mailed copy to patient address on file.

## 2018-05-26 ENCOUNTER — Other Ambulatory Visit: Payer: 59

## 2018-05-26 ENCOUNTER — Ambulatory Visit: Payer: 59

## 2018-05-26 ENCOUNTER — Ambulatory Visit: Payer: 59 | Admitting: Internal Medicine

## 2018-06-03 ENCOUNTER — Other Ambulatory Visit: Payer: Self-pay | Admitting: Medical Oncology

## 2018-06-03 DIAGNOSIS — C3491 Malignant neoplasm of unspecified part of right bronchus or lung: Secondary | ICD-10-CM

## 2018-06-03 NOTE — Progress Notes (Signed)
[facility] OFFICE PROGRESS NOTE  Aura Dials, MD [place] [address]  DIAGNOSIS:Metastatic non-small cell lung cancer, adenocarcinoma initially diagnosed as unresectable stage IIIa (T2a, N2, M0) in July 2016.  Foundation One molecular studies reveal that he was positive for [place], [place], and STAG2 [id number]. He was negative for: EGFR, ALK, BRAF, MET, RET, ERBB2 and ROS1  PRIOR THERAPY: 1) Concurrent chemoradiation with chemotherapy in the form of weekly carboplatin for AUC 2 paclitaxel 45 mg/m given concurrent with radiation therapy. First cycle started 05/09/2015. Status post 7 cycles. 2) Consolidation chemotherapy with carboplatin for AUC of 5 on day 1 and gemcitabine 1000 MG/M2 on days 1 and 8 every 3 weeks. First dose 08/15/2015. Discontinued secondary to intolerance. 3) Immunotherapy with Nivolumab 240 mg IV every 2 weeks, status post 35 cycles. 4)Immunotherapy with Nivolumab 480 mg IV every 4 weeks status post 10 cycles.  CURRENT THERAPY:Systemic chemotherapy with Alimta 500 mg/M2 every 3 weeks. First dose May 14, 2018.  Status post 1 cycle.  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  The patient is having a fair amount of fatigue and shortness of breath with exertion.  He continues to work full-time but reports that he is short of breath when he ambulates short distances at work.  He denies fevers and chills.  Denies chest pain, cough, hemoptysis.  Denies nausea, vomiting, constipation.  His diarrhea has resolved.  He remains on a prednisone taper currently at 10 mg a day.  He is scheduled to reduce to 5 mg a day on 06/09/2018.  He reports that his appetite is fair and he has lost 1 pound since his last visit.  Pain control with oxycodone as needed.  The patient is here for evaluation prior to cycle #2 of his treatment.  MEDICAL HISTORY: Past Medical History:   Diagnosis Date  . Cancer ([place]    skin cancer  . Constipation    AFTER ANESTHESIA  . Difficulty sleeping   . History of nonmelanoma skin cancer   . Hypertension 10/30/2016  . Inguinal hernia   . Lung mass    right / HAS HAD RADIATION AND CHEMO FOR LUNG CANCER  . Pneumothorax on right 08/24/2015  . Productive cough    "DUE TO RADIATION"  . Radiation 05/11/15-06/21/15   NSCLCA/right lung  . Smoking 1/2 pack a day or less 09/17/2016  . Spontaneous pneumothorax MANY YRS AGO AND AGAIN 04/04/15   HISTORY OF (ONLY)  . Weight loss 04/01/2017    ALLERGIES:  is allergic to aleve [naproxen].  MEDICATIONS:  Current Outpatient Medications  Medication Sig Dispense Refill  . albuterol (PROVENTIL HFA) 108 (90 Base) MCG/ACT inhaler Inhale 2 puffs into the lungs every 6 (six) hours as needed for wheezing or shortness of breath.    . Arginine (RA L-ARGININE) 1000 MG TABS Take 500 mg by mouth daily.     . Ascorbic Acid (VITAMIN C) 1000 MG tablet Take 1,000 mg by mouth daily. Pt takes 2 5082mtabs    . B Complex Vitamins (B COMPLEX PO) Take 1 capsule by mouth daily.     . Marland KitchenALCIUM CITRATE PO Take 1 tablet by mouth daily.    . Cyanocobalamin (VITAMIN B 12 PO) Take 1 tablet by mouth daily.    . Marland Kitchenexamethasone (DECADRON) 4 MG tablet Take 4 mg twice a day the day before, day of and day after chemo  6 tablet 0  . Diphenhyd-Hydrocort-Nystatin (FIRST-DUKES MOUTHWASH) SUSP Take 5 mLs by mouth QID. 110 mL 6  . dronabinol (MARINOL) 2.5 MG capsule Take 1 capsule (2.5 mg total) by mouth 2 (two) times daily before a meal. 60 capsule 0  . ferrous sulfate 325 (65 FE) MG tablet Take 325 mg by mouth daily with breakfast.    . folic acid (FOLVITE) 1 MG tablet Take 1 tablet (1 mg total) by mouth daily. 30 tablet 4  . guaiFENesin (MUCINEX) 600 MG 12 hr tablet Take 1 tablet (600 mg total) by mouth 2 (two) times daily as needed. (Patient taking differently: Take 1,200 mg by mouth 2 (two) times daily as needed. )    . ibuprofen  (ADVIL,MOTRIN) 200 MG tablet Take 200 mg by mouth 3 (three) times daily.    . magnesium 30 MG tablet Take 30 mg by mouth daily.    . Multiple Vitamin (MULTI VITAMIN DAILY PO) Take 1 tablet by mouth daily.    . Omega-3 Fatty Acids (FISH OIL) 1000 MG CAPS Take 1,000 mg by mouth daily.    Marland Kitchen oxyCODONE-acetaminophen (PERCOCET/ROXICET) 5-325 MG tablet Take 1 tablet by mouth every 8 (eight) hours as needed for severe pain. 45 tablet 0  . predniSONE (DELTASONE) 10 MG tablet 4 tablets p.o. daily for 7 days followed by 3 tablets p.o. daily for 7 days followed by 2 tablets p.o. daily for 7 days followed by 1 tablet p.o. daily for 7 days. 36 tablet 0  . prochlorperazine (COMPAZINE) 10 MG tablet Take 1 tablet (10 mg total) by mouth every 6 (six) hours as needed for nausea or vomiting. 30 tablet 0  . saccharomyces boulardii (FLORASTOR) 250 MG capsule Take 250 mg by mouth 2 (two) times daily.    . SYMBICORT 160-4.5 MCG/ACT inhaler INHALE 2 PUFFS TWICE DAILY 10.2 Inhaler 5  . temazepam (RESTORIL) 15 MG capsule Take 1 capsule (15 mg total) by mouth at bedtime as needed for sleep. 30 capsule 0  . vitamin A 10000 UNIT capsule 1 capsule with food or milk    . methylphenidate (RITALIN) 5 MG tablet Take 1 tablet a day. 30 tablet 0   No current facility-administered medications for this visit.     SURGICAL HISTORY:  Past Surgical History:  Procedure Laterality Date  . CHEST TUBE INSERTION Right 08/02/2015   Procedure: INSERTION OF SECOND RIGHT CHEST TUBE WITH FLUROSCOPY;  Surgeon: Grace Isaac, MD;  Location: [facility];  Service: Thoracic;  Laterality: Right;  . CHEST TUBE INSERTION  07/28/2015  . COLONOSCOPY W/ BIOPSIES AND POLYPECTOMY    . HERNIA REPAIR  2011  . INGUINAL HERNIA REPAIR Left 07/15/2015   Procedure: OPEN LEFT INGUINAL HERNIA REPAIR;  Surgeon: Johnathan Hausen, MD;  Location: [facility];  Service: General;  Laterality: Left;  With MESH  . TONSILLECTOMY    . VIDEO BRONCHOSCOPY WITH ENDOBRONCHIAL  ULTRASOUND N/A 04/18/2015   Procedure: VIDEO BRONCHOSCOPY WITH ENDOBRONCHIAL ULTRASOUND;  Surgeon: Grace Isaac, MD;  Location: [facility];  Service: Thoracic;  Laterality: N/A;  . VIDEO BRONCHOSCOPY WITH INSERTION OF INTERBRONCHIAL VALVE (IBV) N/A 08/02/2015   Procedure: VIDEO BRONCHOSCOPY WITH ATTEMPTED INSERTION OF INTERBRONCHIAL VALVE (IBV) WITH FLUROSCOPY;  Surgeon: Grace Isaac, MD;  Location: [facility];  Service: Thoracic;  Laterality: N/A;  . WISDOM TOOTH EXTRACTION      REVIEW OF SYSTEMS:   Review of Systems  Constitutional: Negative for appetite change, chills, fever.  Positive for fatigue. HENT:   Negative for  mouth sores, nosebleeds, sore throat and trouble swallowing.   Eyes: Negative for eye problems and icterus.  Respiratory: Negative for cough, hemoptysis, and wheezing.  Positive for shortness of breath with exertion. Cardiovascular: Negative for chest pain and leg swelling.  Gastrointestinal: Negative for abdominal pain, constipation, diarrhea, nausea and vomiting.  Genitourinary: Negative for bladder incontinence, difficulty urinating, dysuria, frequency and hematuria.   Musculoskeletal: Negative for back pain, gait problem, neck pain and neck stiffness.  Skin: Negative for itching and rash.  Neurological: Negative for dizziness, extremity weakness, gait problem, headaches, light-headedness and seizures.  Hematological: Negative for adenopathy. Does not bruise/bleed easily.  Psychiatric/Behavioral: Negative for confusion, depression and sleep disturbance. The patient is not nervous/anxious.     PHYSICAL EXAMINATION:  Blood pressure (!) 155/70, pulse 91, temperature 97.9 F (36.6 C), temperature source Oral, resp. rate 18, height 5' 10"  (1.778 m), weight 99 lb 14.4 oz (45.3 kg), SpO2 100 %.  ECOG PERFORMANCE STATUS: 1 - Symptomatic but completely ambulatory  Physical Exam  Constitutional: Oriented to person, place, and time. No distress.  HENT:  Head: Normocephalic and  atraumatic.  Mouth/Throat: Oropharynx is clear and moist. No oropharyngeal exudate.  Eyes: Conjunctivae are normal. Right eye exhibits no discharge. Left eye exhibits no discharge. No scleral icterus.  Neck: Normal range of motion. Neck supple.  Cardiovascular: Normal rate, regular rhythm, normal heart sounds and intact distal pulses.   Pulmonary/Chest: Effort normal and breath sounds normal. No respiratory distress. No wheezes. No rales.  Abdominal: Soft. Bowel sounds are normal. Exhibits no distension and no mass. There is no tenderness.  Musculoskeletal: Normal range of motion. Exhibits no edema.  Lymphadenopathy:    No cervical adenopathy.  Neurological: Alert and oriented to person, place, and time. Exhibits normal muscle tone. Gait normal. Coordination normal.  Skin: Skin is warm and dry. No rash noted. Not diaphoretic. No erythema. No pallor.  Psychiatric: Mood, memory and judgment normal.  Vitals reviewed.  LABORATORY DATA: Lab Results  Component Value Date   WBC 9.9 06/04/2018   HGB 12.5 (L) 06/04/2018   HCT 38.5 06/04/2018   MCV 91.2 06/04/2018   PLT 233 06/04/2018      Chemistry      Component Value Date/Time   NA 138 06/04/2018 1400   NA 139 10/14/2017 1523   K 4.1 06/04/2018 1400   K 3.9 10/14/2017 1523   CL 105 06/04/2018 1400   CO2 21 (L) 06/04/2018 1400   CO2 24 10/14/2017 1523   BUN 20 06/04/2018 1400   BUN 27.2 (H) 10/14/2017 1523   CREATININE 0.88 06/04/2018 1400   CREATININE 0.9 10/14/2017 1523      Component Value Date/Time   CALCIUM 8.8 (L) 06/04/2018 1400   CALCIUM 9.5 10/14/2017 1523   ALKPHOS 111 06/04/2018 1400   ALKPHOS 111 10/14/2017 1523   AST 12 (L) 06/04/2018 1400   AST 21 10/14/2017 1523   ALT 15 06/04/2018 1400   ALT 20 10/14/2017 1523   BILITOT 0.3 06/04/2018 1400   BILITOT 0.26 10/14/2017 1523       RADIOGRAPHIC STUDIES:  No results found.   ASSESSMENT/PLAN:  Adenocarcinoma of right lung, stage 4 (HCC) This is a very  pleasant 77 year old white male with metastatic non-small cell lung cancer, adenocarcinoma status post short course of concurrent chemoradiation with weekly carboplatin and paclitaxel followed by 1 cycle of consolidation chemotherapy discontinued secondary to intolerance. He was a started on treatment with immunotherapy with Nivolumab 240 mg IV every 2  weeks status post 35 cycles.  He tolerated the previous treatment well with no significant adverse effects. His treatment was switched to a Nivolumab 480 mg IV every 4 weeks status post 10 cycles.   He tolerated this treatment well except for recent episodes of diarrhea. He was started on high-dose prednisone that was tapered over the last few weeks.  He has initial improvement in the diarrhea then he has relapse.  He was again started on high-dose prednisone for his persistent diarrhea. 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 He is currently on second line treatment with single agent Alimta 500 mg/M2 every 3 weeks.  Status post 1 cycle which he tolerated fairly well with the exception of fatigue and worsening dyspnea on exertion.  His diarrhea has completely resolved.  Is not entirely clear if his fatigue and shortness of breath with exertion is complete related to chemotherapy as he has been tapering down off of prednisone.   The patient was seen with Dr. Julien Nordmann.  Recommend he proceed with cycle 2 of his treatment today as scheduled.  For fatigue, he was previously prescribed Ritalin and never started taking this.  He is asked if he can try this medication.  He was given a new prescription for Ritalin 5 mg daily.  The patient will follow-up in 3 weeks for evaluation prior to cycle #3 of his treatment.  For the low back pain, he will continue on Percocet 5/325 mg p.o. every 8 hours as needed for  pain.  He was advised to call immediately if he has any concerning symptoms in the interval. The patient voices understanding of current disease status and treatment options and is in agreement with the current care plan. All questions were answered. The patient knows to call the clinic with any problems, questions or concerns. We can certainly see the patient much sooner if necessary.    No orders of the defined types were placed in this encounter.    Mikey Bussing, DNP, AGPCNP-BC, AOCNP 06/04/18   ADDENDUM: Hematology/Oncology Attending: I had a face-to-face encounter with the patient today.  I recommended his care plan.  This is a very pleasant 77 years old white male with metastatic non-small cell lung cancer that was initially diagnosed as a stage IIIa adenocarcinoma status post induction concurrent chemoradiation.  The patient had evidence for disease progression and he was started on treatment with immunotherapy with Nivolumab for close to 3 years. This was discontinued recently secondary to disease progression as well as intolerability with persistent diarrhea. The patient was a started on third line treatment with systemic chemotherapy with single agent Alimta status post 1 cycle.  He tolerated the first cycle of this treatment well except for increasing fatigue which could be secondary to his chemotherapy but also secondary to tapering of his prednisone.  His diarrhea has significantly improved and we will continue with the prednisone taper. I recommended for the patient to proceed with cycle #2 today as scheduled. We will see him back for follow-up visit in 3 weeks for evaluation before cycle #3. He was advised to call immediately if he has any concerning symptoms in the interval.  Disclaimer: This note was dictated with voice recognition software. Similar sounding words can inadvertently be transcribed and may be missed upon review. Eilleen Kempf, MD 06/04/18

## 2018-06-04 ENCOUNTER — Inpatient Hospital Stay: Payer: 59

## 2018-06-04 ENCOUNTER — Inpatient Hospital Stay: Payer: 59 | Attending: Oncology

## 2018-06-04 ENCOUNTER — Telehealth: Payer: Self-pay | Admitting: Oncology

## 2018-06-04 ENCOUNTER — Inpatient Hospital Stay (HOSPITAL_BASED_OUTPATIENT_CLINIC_OR_DEPARTMENT_OTHER): Payer: 59 | Admitting: Oncology

## 2018-06-04 ENCOUNTER — Encounter: Payer: Self-pay | Admitting: Oncology

## 2018-06-04 VITALS — BP 155/70 | HR 91 | Temp 97.9°F | Resp 18 | Ht 70.0 in | Wt 99.9 lb

## 2018-06-04 DIAGNOSIS — Z85828 Personal history of other malignant neoplasm of skin: Secondary | ICD-10-CM

## 2018-06-04 DIAGNOSIS — I10 Essential (primary) hypertension: Secondary | ICD-10-CM | POA: Insufficient documentation

## 2018-06-04 DIAGNOSIS — M545 Low back pain: Secondary | ICD-10-CM | POA: Insufficient documentation

## 2018-06-04 DIAGNOSIS — Z79899 Other long term (current) drug therapy: Secondary | ICD-10-CM | POA: Insufficient documentation

## 2018-06-04 DIAGNOSIS — C3411 Malignant neoplasm of upper lobe, right bronchus or lung: Secondary | ICD-10-CM

## 2018-06-04 DIAGNOSIS — Z5111 Encounter for antineoplastic chemotherapy: Secondary | ICD-10-CM | POA: Diagnosis not present

## 2018-06-04 DIAGNOSIS — R634 Abnormal weight loss: Secondary | ICD-10-CM

## 2018-06-04 DIAGNOSIS — R531 Weakness: Secondary | ICD-10-CM

## 2018-06-04 DIAGNOSIS — F17210 Nicotine dependence, cigarettes, uncomplicated: Secondary | ICD-10-CM

## 2018-06-04 DIAGNOSIS — Z923 Personal history of irradiation: Secondary | ICD-10-CM

## 2018-06-04 DIAGNOSIS — R197 Diarrhea, unspecified: Secondary | ICD-10-CM

## 2018-06-04 DIAGNOSIS — M7989 Other specified soft tissue disorders: Secondary | ICD-10-CM | POA: Insufficient documentation

## 2018-06-04 DIAGNOSIS — J441 Chronic obstructive pulmonary disease with (acute) exacerbation: Secondary | ICD-10-CM | POA: Insufficient documentation

## 2018-06-04 DIAGNOSIS — K5900 Constipation, unspecified: Secondary | ICD-10-CM | POA: Insufficient documentation

## 2018-06-04 DIAGNOSIS — R5383 Other fatigue: Secondary | ICD-10-CM

## 2018-06-04 DIAGNOSIS — C3491 Malignant neoplasm of unspecified part of right bronchus or lung: Secondary | ICD-10-CM

## 2018-06-04 LAB — CMP (CANCER CENTER ONLY)
ALT: 15 U/L (ref 0–44)
AST: 12 U/L — AB (ref 15–41)
Albumin: 3.4 g/dL — ABNORMAL LOW (ref 3.5–5.0)
Alkaline Phosphatase: 111 U/L (ref 38–126)
Anion gap: 12 (ref 5–15)
BILIRUBIN TOTAL: 0.3 mg/dL (ref 0.3–1.2)
BUN: 20 mg/dL (ref 8–23)
CHLORIDE: 105 mmol/L (ref 98–111)
CO2: 21 mmol/L — ABNORMAL LOW (ref 22–32)
CREATININE: 0.88 mg/dL (ref 0.61–1.24)
Calcium: 8.8 mg/dL — ABNORMAL LOW (ref 8.9–10.3)
Glucose, Bld: 137 mg/dL — ABNORMAL HIGH (ref 70–99)
POTASSIUM: 4.1 mmol/L (ref 3.5–5.1)
Sodium: 138 mmol/L (ref 135–145)
TOTAL PROTEIN: 6.6 g/dL (ref 6.5–8.1)

## 2018-06-04 LAB — CBC WITH DIFFERENTIAL (CANCER CENTER ONLY)
BASOS ABS: 0 10*3/uL (ref 0.0–0.1)
Basophils Relative: 0 %
EOS PCT: 0 %
Eosinophils Absolute: 0 10*3/uL (ref 0.0–0.5)
HEMATOCRIT: 38.5 % (ref 38.4–49.9)
Hemoglobin: 12.5 g/dL — ABNORMAL LOW (ref 13.0–17.1)
LYMPHS ABS: 0.2 10*3/uL — AB (ref 0.9–3.3)
LYMPHS PCT: 2 %
MCH: 29.6 pg (ref 27.2–33.4)
MCHC: 32.5 g/dL (ref 32.0–36.0)
MCV: 91.2 fL (ref 79.3–98.0)
MONO ABS: 0.2 10*3/uL (ref 0.1–0.9)
Monocytes Relative: 2 %
NEUTROS ABS: 9.5 10*3/uL — AB (ref 1.5–6.5)
Neutrophils Relative %: 96 %
PLATELETS: 233 10*3/uL (ref 140–400)
RBC: 4.22 MIL/uL (ref 4.20–5.82)
RDW: 17 % — AB (ref 11.0–14.6)
WBC Count: 9.9 10*3/uL (ref 4.0–10.3)

## 2018-06-04 MED ORDER — PROCHLORPERAZINE MALEATE 10 MG PO TABS
ORAL_TABLET | ORAL | Status: AC
  Filled 2018-06-04: qty 1

## 2018-06-04 MED ORDER — METHYLPHENIDATE HCL 5 MG PO TABS: ORAL_TABLET | ORAL | 0 refills | Status: DC

## 2018-06-04 MED ORDER — SODIUM CHLORIDE 0.9 % IV SOLN
Freq: Once | INTRAVENOUS | Status: AC
  Administered 2018-06-04: 16:00:00 via INTRAVENOUS
  Filled 2018-06-04: qty 250

## 2018-06-04 MED ORDER — PROCHLORPERAZINE MALEATE 10 MG PO TABS
10.0000 mg | ORAL_TABLET | Freq: Once | ORAL | Status: AC
  Administered 2018-06-04: 10 mg via ORAL

## 2018-06-04 MED ORDER — SODIUM CHLORIDE 0.9 % IV SOLN
530.0000 mg/m2 | Freq: Once | INTRAVENOUS | Status: AC
  Administered 2018-06-04: 800 mg via INTRAVENOUS
  Filled 2018-06-04: qty 20

## 2018-06-04 NOTE — Patient Instructions (Signed)
[facility] Discharge Instructions for Patients Receiving Chemotherapy  Today you received the following chemotherapy agents pemetrexed (Alimta)  To help prevent nausea and vomiting after your treatment, we encourage you to take your nausea medication as directed by your doctor.    If you develop nausea and vomiting that is not controlled by your nausea medication, call the clinic.   BELOW ARE SYMPTOMS THAT SHOULD BE REPORTED IMMEDIATELY:  *FEVER GREATER THAN 100.5 F  *CHILLS WITH OR WITHOUT FEVER  NAUSEA AND VOMITING THAT IS NOT CONTROLLED WITH YOUR NAUSEA MEDICATION  *UNUSUAL SHORTNESS OF BREATH  *UNUSUAL BRUISING OR BLEEDING  TENDERNESS IN MOUTH AND THROAT WITH OR WITHOUT PRESENCE OF ULCERS  *URINARY PROBLEMS  *BOWEL PROBLEMS  UNUSUAL RASH Items with * indicate a potential emergency and should be followed up as soon as possible.  Feel free to call the clinic should you have any questions or concerns. The clinic phone number is [phone].  Please show the [place] at check-in to the Emergency Department and triage nurse.

## 2018-06-04 NOTE — Telephone Encounter (Signed)
Scheduled appt per 8/7 los - gave patient AVS and calender per los. Patient  need latest appt in the in the afternoon - per MM okay schedule on Mondays so that pt can be in MM schedule.

## 2018-06-04 NOTE — Assessment & Plan Note (Signed)
This is a very pleasant 77 year old white male with metastatic non-small cell lung cancer, adenocarcinoma status post short course of concurrent chemoradiation with weekly carboplatin and paclitaxel followed by 1 cycle of consolidation chemotherapy discontinued secondary to intolerance. He was a started on treatment with immunotherapy with Nivolumab 240 mg IV every 2 weeks status post 35 cycles.  He tolerated the previous treatment well with no significant adverse effects. His treatment was switched to a Nivolumab 480 mg IV every 4 weeks status post 10 cycles.   He tolerated this treatment well except for recent episodes of diarrhea. He was started on high-dose prednisone that was tapered over the last few weeks.  He has initial improvement in the diarrhea then he has relapse.  He was again started on high-dose prednisone for his persistent diarrhea. 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 He is currently on second line treatment with single agent Alimta 500 mg/M2 every 3 weeks.  Status post 1 cycle which he tolerated fairly well with the exception of fatigue and worsening dyspnea on exertion.  His diarrhea has completely resolved.  Is not entirely clear if his fatigue and shortness of breath with exertion is complete related to chemotherapy as he has been tapering down off of prednisone.   The patient was seen with Dr. Julien Nordmann.  Recommend he proceed with cycle 2 of his treatment today as scheduled.  For fatigue, he was previously prescribed Ritalin and never started taking this.  He is asked if he can try this medication.  He was given a new prescription for Ritalin 5 mg daily.  The patient will follow-up in 3 weeks for evaluation prior to cycle #3 of his treatment.  For the low back pain, he will continue on Percocet 5/325 mg p.o. every 8 hours as  needed for pain.  He was advised to call immediately if he has any concerning symptoms in the interval. The patient voices understanding of current disease status and treatment options and is in agreement with the current care plan. All questions were answered. The patient knows to call the clinic with any problems, questions or concerns. We can certainly see the patient much sooner if necessary.

## 2018-06-15 ENCOUNTER — Other Ambulatory Visit: Payer: Self-pay | Admitting: Internal Medicine

## 2018-06-15 DIAGNOSIS — K529 Noninfective gastroenteritis and colitis, unspecified: Secondary | ICD-10-CM

## 2018-06-15 DIAGNOSIS — R197 Diarrhea, unspecified: Secondary | ICD-10-CM

## 2018-06-18 ENCOUNTER — Inpatient Hospital Stay (HOSPITAL_BASED_OUTPATIENT_CLINIC_OR_DEPARTMENT_OTHER): Payer: 59 | Admitting: Medical

## 2018-06-18 ENCOUNTER — Ambulatory Visit (HOSPITAL_COMMUNITY)
Admission: RE | Admit: 2018-06-18 | Discharge: 2018-06-18 | Disposition: A | Discharge status: Home / Self Care | Payer: 59 | Source: Ambulatory Visit | Attending: Medical | Admitting: Medical

## 2018-06-18 ENCOUNTER — Telehealth: Payer: Self-pay | Admitting: Medical Oncology

## 2018-06-18 ENCOUNTER — Inpatient Hospital Stay: Payer: 59

## 2018-06-18 ENCOUNTER — Telehealth: Payer: Self-pay | Admitting: Internal Medicine

## 2018-06-18 ENCOUNTER — Other Ambulatory Visit: Payer: Self-pay | Admitting: Medical Oncology

## 2018-06-18 VITALS — BP 122/72 | HR 102 | Temp 98.7°F | Resp 18 | Ht 70.0 in | Wt 101.9 lb

## 2018-06-18 DIAGNOSIS — K5900 Constipation, unspecified: Secondary | ICD-10-CM

## 2018-06-18 DIAGNOSIS — R5383 Other fatigue: Secondary | ICD-10-CM

## 2018-06-18 DIAGNOSIS — R531 Weakness: Secondary | ICD-10-CM

## 2018-06-18 DIAGNOSIS — R0902 Hypoxemia: Secondary | ICD-10-CM

## 2018-06-18 DIAGNOSIS — M545 Low back pain: Secondary | ICD-10-CM

## 2018-06-18 DIAGNOSIS — R197 Diarrhea, unspecified: Secondary | ICD-10-CM | POA: Diagnosis not present

## 2018-06-18 DIAGNOSIS — R634 Abnormal weight loss: Secondary | ICD-10-CM

## 2018-06-18 DIAGNOSIS — C3411 Malignant neoplasm of upper lobe, right bronchus or lung: Secondary | ICD-10-CM

## 2018-06-18 DIAGNOSIS — C3491 Malignant neoplasm of unspecified part of right bronchus or lung: Secondary | ICD-10-CM

## 2018-06-18 DIAGNOSIS — J441 Chronic obstructive pulmonary disease with (acute) exacerbation: Secondary | ICD-10-CM | POA: Diagnosis not present

## 2018-06-18 DIAGNOSIS — I10 Essential (primary) hypertension: Secondary | ICD-10-CM

## 2018-06-18 DIAGNOSIS — Z85828 Personal history of other malignant neoplasm of skin: Secondary | ICD-10-CM

## 2018-06-18 DIAGNOSIS — Z79899 Other long term (current) drug therapy: Secondary | ICD-10-CM

## 2018-06-18 DIAGNOSIS — F17210 Nicotine dependence, cigarettes, uncomplicated: Secondary | ICD-10-CM

## 2018-06-18 DIAGNOSIS — Z923 Personal history of irradiation: Secondary | ICD-10-CM

## 2018-06-18 LAB — CMP (CANCER CENTER ONLY)
ALT: 17 U/L (ref 0–44)
ANION GAP: 11 (ref 5–15)
AST: 19 U/L (ref 15–41)
Albumin: 2.9 g/dL — ABNORMAL LOW (ref 3.5–5.0)
Alkaline Phosphatase: 114 U/L (ref 38–126)
BUN: 22 mg/dL (ref 8–23)
CO2: 25 mmol/L (ref 22–32)
Calcium: 9.3 mg/dL (ref 8.9–10.3)
Chloride: 105 mmol/L (ref 98–111)
Creatinine: 1.05 mg/dL (ref 0.61–1.24)
GFR, Est AFR Am: >60 mL/min (ref >60)
GFR, Estimated: >60 mL/min (ref >60)
GLUCOSE: 132 mg/dL — AB (ref 70–99)
POTASSIUM: 4.2 mmol/L (ref 3.5–5.1)
SODIUM: 141 mmol/L (ref 135–145)
Total Bilirubin: 0.3 mg/dL (ref 0.3–1.2)
Total Protein: 6.4 g/dL — ABNORMAL LOW (ref 6.5–8.1)

## 2018-06-18 LAB — CBC WITH DIFFERENTIAL (CANCER CENTER ONLY)
Basophils Absolute: 0 10*3/uL (ref 0.0–0.1)
Basophils Relative: 0 %
Eosinophils Absolute: 0 10*3/uL (ref 0.0–0.5)
Eosinophils Relative: 0 %
HEMATOCRIT: 35.7 % — AB (ref 38.4–49.9)
Hemoglobin: 11.7 g/dL — ABNORMAL LOW (ref 13.0–17.1)
LYMPHS ABS: 0.4 10*3/uL — AB (ref 0.9–3.3)
LYMPHS PCT: 5 %
MCH: 30.1 pg (ref 27.2–33.4)
MCHC: 32.8 g/dL (ref 32.0–36.0)
MCV: 91.8 fL (ref 79.3–98.0)
MONOS PCT: 9 %
Monocytes Absolute: 0.7 10*3/uL (ref 0.1–0.9)
NEUTROS ABS: 6.4 10*3/uL (ref 1.5–6.5)
Neutrophils Relative %: 86 %
Platelet Count: 194 10*3/uL (ref 140–400)
RBC: 3.89 MIL/uL — ABNORMAL LOW (ref 4.20–5.82)
RDW: 16.9 % — AB (ref 11.0–14.6)
WBC Count: 7.6 10*3/uL (ref 4.0–10.3)

## 2018-06-18 MED ORDER — PREDNISONE 10 MG PO TABS: ORAL_TABLET | ORAL | 2 refills | Status: DC

## 2018-06-18 NOTE — Telephone Encounter (Addendum)
Increased SOB, can't walk from room to room due to breathing. Ankles swollen. Per [facility] CXR,  labs and [facility] today -

## 2018-06-18 NOTE — Patient Instructions (Signed)
Shortness of Breath, Adult  Shortness of breath means you have trouble breathing. Your lungs are organs for breathing.  Follow these instructions at home:  Pay attention to any changes in your symptoms. Take these actions to help with your condition:  ? Do not smoke. Smoking can cause shortness of breath. If you need help to quit smoking, ask your doctor.  ? Avoid things that can make it harder to breathe, such as:  ? Mold.  ? Dust.  ? Air pollution.  ? Chemical smells.  ? Things that can cause allergy symptoms (allergens), if you have allergies.  ? Keep your living space clean and free of mold and dust.  ? Rest as needed. Slowly return to your usual activities.  ? Take over-the-counter and prescription medicines, including oxygen and inhaled medicines, only as told by your doctor.  ? Keep all follow-up visits as told by your doctor. This is important.  Contact a doctor if:  ? Your condition does not get better as soon as expected.  ? You have a hard time doing your normal activities, even after you rest.  ? You have new symptoms.  Get help right away if:  ? You have trouble breathing when you are resting.  ? You feel light-headed or you faint.  ? You have a cough that is not helped by medicines.  ? You cough up blood.  ? You have pain with breathing.  ? You have pain in your chest, arms, shoulders, or belly (abdomen).  ? You have a fever.  ? You cannot walk up stairs.  ? You cannot exercise the way you normally do.  This information is not intended to replace advice given to you by your health care provider. Make sure you discuss any questions you have with your health care provider.  Document Released: 04/02/2008 Document Revised: 11/01/2016 Document Reviewed: 11/01/2016  Elsevier Interactive Patient Education ? 2017 [employer]

## 2018-06-18 NOTE — Progress Notes (Signed)
Pt reports gen SOB over last 3 weeks.  DOE evident during ambulation.  Pt's sats dropped into low 90s & upper 80s.  Improved with 2L [place] oxygen.  PA Lucianne Lei aware.

## 2018-06-18 NOTE — Telephone Encounter (Signed)
Spoke to pt regarding upcoming aug appts per 8/21 sch message.

## 2018-06-19 NOTE — Progress Notes (Signed)
[facility] Progress Note   Gabriel [name] [id number] 1941/02/11 77 y.o.  [name] is managed by Dr. Fanny Bien. Mohamed  Actively treated with chemotherapy/immunotherapy: yes  Current Therapy: Alimta  Last Treated: 06/04/2018 (cycle 2, day 1)  Assessment: Plan:    COPD exacerbation ([place]) - Plan: DISCONTINUED: predniSONE (DELTASONE) 10 MG tablet  Malignant neoplasm of upper lobe of right lung ([place]) - Plan: For home use only DME oxygen  Hypoxia - Plan: For home use only DME oxygen   COPD exacerbation: The patient was given a prescription for prednisone 10 mg once daily.  Malignant neoplasm of the upper right lobe of the right lung: The patient continues to be treated with Alimta under the care of Dr. Fanny Bien. Mohamed and is status post cycle 2, day 1 of therapy which was dosed on 06/04/2018.  Hypoxia: In order was placed for supplemental oxygen with a request evaluation for a portable oxygen concentrator.  A chest x-ray was completed today and reviewed with the patient.  Results show no acute findings.  Chronic stable changes of the right lung and hemothorax were noted.  A CBC and chemistry panel returned stable.  O2 sat @ rest (room air): 90%  O2 sat walking (room air): 85%  O2 sat walking (with oxygen at 2 LPM via [place]): 97%  Oxygen flow rate: 2 LPM  Please see After Visit Summary for patient specific instructions.  Future Appointments  Date Time Provider [place]  06/25/2018  1:30 PM [facility] LAB 6 [facility] None  06/25/2018  2:00 PM Curcio, Erasmo Downer R, NP [facility] None  06/25/2018  3:15 PM [facility] INFUSION [facility] None  07/16/2018  2:30 PM [facility] LAB 5 [facility] None  07/16/2018  3:00 PM Curcio, Roselie Awkward, NP [facility] None  07/16/2018  3:30 PM [facility] INFUSION [facility] None  08/04/2018  1:45 PM [facility] LAB 5 [facility] None  08/04/2018  2:00 PM Curt Bears, MD [facility] None  08/04/2018  3:15 PM  [facility] INFUSION [facility] None  08/25/2018  1:45 PM [facility] LAB 2 [facility] None  08/25/2018  2:00 PM Curt Bears, MD [facility] None  08/25/2018  3:15 PM [facility] INFUSION [facility] None    Orders Placed This Encounter  Procedures  . For home use only DME oxygen       Subjective:   Patient ID:  Gabriel [name] is a 77 y.o. (DOB 01-May-1941) male.  Chief Complaint:  Chief Complaint  Patient presents with  . Shortness of Breath    HPI Gabriel [name] is a 77 year old male with a diagnosis of a metastatic non-small cell lung cancer, adenocarcinoma which was initially diagnosed as an unresectable stage IIIa (T2a, N2, M0) in July 2016.  He continues under the care of Dr. Parthenia Ames. Mohammed and is currently treated with Alimta.  He is status post cycle 2, day 1 which was dosed on 06/04/2018.  He is seen today for progressive shortness of breath over the last 2 weeks.  He reports a cough with minimal yellowish sputum.  His energy level is low.  He is noted progressive ankle edema over the last 7 days.  He was on a recent prednisone taper which she completed on Sunday.  A chest x-ray was completed earlier today which showed no acute findings with chronic stable changes in the right lung and pneumothorax.  A CBC and chemistry panel returned stable from his most recent studies.  There was no evidence of progressive anemia.  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  He is not interested in a home oxygen concentrator.  He would like to be evaluated for a portable oxygen concentrator that he can use when he is active.  He reports that this is when he notes most of his shortness of breath.  He denies fevers, chills, or sweats.  Medications: I have reviewed the patient's current medications.  Allergies:  Allergies  Allergen Reactions  . Aleve [Naproxen] Rash    Past Medical History:  Diagnosis Date  . Cancer ([place])    skin cancer  . Constipation    AFTER ANESTHESIA   . Difficulty sleeping   . History of nonmelanoma skin cancer   . Hypertension 10/30/2016  . Inguinal hernia   . Lung mass    right / HAS HAD RADIATION AND CHEMO FOR LUNG CANCER  . Pneumothorax on right 08/24/2015  . Productive cough    "DUE TO RADIATION"  . Radiation 05/11/15-06/21/15   NSCLCA/right lung  . Smoking 1/2 pack a day or less 09/17/2016  . Spontaneous pneumothorax MANY YRS AGO AND AGAIN 04/04/15   HISTORY OF (ONLY)  . Weight loss 04/01/2017    Past Surgical History:  Procedure Laterality Date  . CHEST TUBE INSERTION Right 08/02/2015   Procedure: INSERTION OF SECOND RIGHT CHEST TUBE WITH FLUROSCOPY;  Surgeon: Grace Isaac, MD;  Location: [facility];  Service: Thoracic;  Laterality: Right;  . CHEST TUBE INSERTION  07/28/2015  . COLONOSCOPY W/ BIOPSIES AND POLYPECTOMY    . HERNIA REPAIR  2011  . INGUINAL HERNIA REPAIR Left 07/15/2015   Procedure: OPEN LEFT INGUINAL HERNIA REPAIR;  Surgeon: Johnathan Hausen, MD;  Location: [facility];  Service: General;  Laterality: Left;  With MESH  . TONSILLECTOMY    . VIDEO BRONCHOSCOPY WITH ENDOBRONCHIAL ULTRASOUND N/A 04/18/2015   Procedure: VIDEO BRONCHOSCOPY WITH ENDOBRONCHIAL ULTRASOUND;  Surgeon: Grace Isaac, MD;  Location: [facility];  Service: Thoracic;  Laterality: N/A;  . VIDEO BRONCHOSCOPY WITH INSERTION OF INTERBRONCHIAL VALVE (IBV) N/A 08/02/2015   Procedure: VIDEO BRONCHOSCOPY WITH ATTEMPTED INSERTION OF INTERBRONCHIAL VALVE (IBV) WITH FLUROSCOPY;  Surgeon: Grace Isaac, MD;  Location: [facility];  Service: Thoracic;  Laterality: N/A;  . WISDOM TOOTH EXTRACTION      Family History  Problem Relation Age of Onset  . Heart attack Father   . Heart disease Father   . Hypertension Mother     Social History   Socioeconomic History  . Marital status: Divorced    Spouse name: Not on file  . Number of children: Not on file  . Years of education: Not on file  . Highest education level: Not on file  Occupational History  . Occupation:  Lobbyist: [employer]  . Financial resource strain: Not on file  . Food insecurity:    Worry: Not on file    Inability: Not on file  . Transportation needs:    Medical: Not on file    Non-medical: Not on file  Tobacco Use  . Smoking status: Current Every Day Smoker    Packs/day: 0.25    Years: 55.00    Pack years: 13.75    Types: Cigarettes  . Smokeless tobacco: Never Used  . Tobacco comment: 1/2 ppd 7.25.18 ee  Substance and Sexual Activity  . Alcohol use: Yes    Alcohol/week: 0.0 standard drinks    Comment: every other night -social  . Drug use: No  . Sexual activity:  Not Currently  Lifestyle  . Physical activity:    Days per week: Not on file    Minutes per session: Not on file  . Stress: Not on file  Relationships  . Social connections:    Talks on phone: Not on file    Gets together: Not on file    Attends religious service: Not on file    Active member of club or organization: Not on file    Attends meetings of clubs or organizations: Not on file    Relationship status: Not on file  . Intimate partner violence:    Fear of current or ex partner: Not on file    Emotionally abused: Not on file    Physically abused: Not on file    Forced sexual activity: Not on file  Other Topics Concern  . Not on file  Social History Narrative  . Not on file    Past Medical History, Surgical history, Social history, and Family history were reviewed and updated as appropriate.   Please see review of systems for further details on the patient's review from today.   Review of Systems:  Review of Systems  Constitutional: Negative for chills, diaphoresis and fever.  HENT: Negative for trouble swallowing and voice change.   Respiratory: Positive for cough and shortness of breath. Negative for chest tightness and wheezing.   Cardiovascular: Positive for leg swelling. Negative for chest pain and palpitations.  Gastrointestinal: Negative for  abdominal pain, constipation, diarrhea, nausea and vomiting.  Musculoskeletal: Negative for back pain and myalgias.  Neurological: Negative for dizziness, light-headedness and headaches.    Objective:   Physical Exam:  BP 122/72 (BP Location: Left Arm, Patient Position: Sitting)   Pulse (!) 102   Temp 98.7 F (37.1 C) (Oral)   Resp 18   Ht 5\' 10"  (1.778 m)   Wt 101 lb 14.4 oz (46.2 kg)   SpO2 90%   BMI 14.62 kg/m  ECOG: 1  O2 sat @ rest (room air): 90%  O2 sat walking (room air): 85%  O2 sat walking (with oxygen at 2 LPM via [place]): 97%  Oxygen flow rate: 2 LPM  Physical Exam  Constitutional:  Mr. Clos was significantly short of breath while ambulating on room air only.  He had to stop twice to catch his breath while his oxygen saturation levels were being checked.  HENT:  Head: Normocephalic and atraumatic.  Cardiovascular: Normal rate, regular rhythm and normal heart sounds. Exam reveals no gallop and no friction rub.  No murmur heard. Pulmonary/Chest: Effort normal. No respiratory distress. He has decreased breath sounds in the right lower field. He has no wheezes. He has no rales.  Musculoskeletal:       Right lower leg: He exhibits edema (Trace pitting edema to the knees with 2+ pitting edema of the ankles.  Calf measures 24 cm at 14 cm distal to the inferior pole of the patella.).       Left lower leg: He exhibits edema (Trace pitting edema to the knees with 2+ pitting edema of the ankles. Calf measures 24 cm at 14 cm distal to the inferior pole of the patella.).  Neurological: He is alert.  Skin: Skin is warm and dry. No rash noted. He is not diaphoretic. No erythema.    Lab Review:     Component Value Date/Time   NA 141 06/18/2018 1537   NA 139 10/14/2017 1523   K 4.2 06/18/2018 1537   K 3.9 10/14/2017 1523  CL 105 06/18/2018 1537   CO2 25 06/18/2018 1537   CO2 24 10/14/2017 1523   GLUCOSE 132 (H) 06/18/2018 1537   GLUCOSE 95 10/14/2017 1523   BUN 22  06/18/2018 1537   BUN 27.2 (H) 10/14/2017 1523   CREATININE 1.05 06/18/2018 1537   CREATININE 0.9 10/14/2017 1523   CALCIUM 9.3 06/18/2018 1537   CALCIUM 9.5 10/14/2017 1523   PROT 6.4 (L) 06/18/2018 1537   PROT 7.8 10/14/2017 1523   ALBUMIN 2.9 (L) 06/18/2018 1537   ALBUMIN 3.9 10/14/2017 1523   AST 19 06/18/2018 1537   AST 21 10/14/2017 1523   ALT 17 06/18/2018 1537   ALT 20 10/14/2017 1523   ALKPHOS 114 06/18/2018 1537   ALKPHOS 111 10/14/2017 1523   BILITOT 0.3 06/18/2018 1537   BILITOT 0.26 10/14/2017 1523   GFRNONAA >60 06/18/2018 1537   GFRAA >60 06/18/2018 1537       Component Value Date/Time   WBC 7.6 06/18/2018 1537   WBC 12.7 (H) 05/14/2018 1434   RBC 3.89 (L) 06/18/2018 1537   HGB 11.7 (L) 06/18/2018 1537   HGB 13.1 10/14/2017 1522   HCT 35.7 (L) 06/18/2018 1537   HCT 39.8 10/14/2017 1522   PLT 194 06/18/2018 1537   PLT 263 10/14/2017 1522   MCV 91.8 06/18/2018 1537   MCV 90.6 10/14/2017 1522   MCH 30.1 06/18/2018 1537   MCHC 32.8 06/18/2018 1537   RDW 16.9 (H) 06/18/2018 1537   RDW 14.3 10/14/2017 1522   LYMPHSABS 0.4 (L) 06/18/2018 1537   LYMPHSABS 0.8 (L) 10/14/2017 1522   MONOABS 0.7 06/18/2018 1537   MONOABS 0.5 10/14/2017 1522   EOSABS 0.0 06/18/2018 1537   EOSABS 0.2 10/14/2017 1522   BASOSABS 0.0 06/18/2018 1537   BASOSABS 0.0 10/14/2017 1522   -------------------------------  Imaging from last 24 hours (if applicable):  Radiology interpretation: Dg Chest 1 View  Result Date: 06/18/2018 CLINICAL DATA:  Currently being treated for non-small lung cancer right lung. Shortness of breath 2 weeks. EXAM: CHEST  1 VIEW COMPARISON:  03/03/2018 and 11/26/2016 FINDINGS: Patient is rotated to the right. Stable chronic changes over the right lung/thorax. Mild prominence of the left perihilar markings. Cardiac silhouette is within normal. Remainder the exam is unchanged. IMPRESSION: No acute findings. Chronic stable changes of the right lung/hemithorax.  Electronically Signed   By: Marin Olp M.D.   On: 06/18/2018 15:49        This case was discussed with Dr. Julien Nordmann. He expressed agreement with my management of this patient.

## 2018-06-19 NOTE — Addendum Note (Signed)
Addended by: Harle Stanford on: 06/19/2018 03:03 PM   Modules accepted: Orders

## 2018-06-23 ENCOUNTER — Ambulatory Visit: Payer: 59

## 2018-06-23 ENCOUNTER — Other Ambulatory Visit: Payer: 59

## 2018-06-23 ENCOUNTER — Ambulatory Visit: Payer: 59 | Admitting: Oncology

## 2018-06-24 ENCOUNTER — Other Ambulatory Visit: Payer: Self-pay | Admitting: Medical Oncology

## 2018-06-24 ENCOUNTER — Telehealth: Payer: Self-pay | Admitting: Medical Oncology

## 2018-06-24 DIAGNOSIS — C3491 Malignant neoplasm of unspecified part of right bronchus or lung: Secondary | ICD-10-CM

## 2018-06-24 MED ORDER — DEXAMETHASONE 4 MG PO TABS: ORAL_TABLET | ORAL | 0 refills | Status: DC

## 2018-06-24 NOTE — Telephone Encounter (Signed)
Take dex premed with prednisone 10 mg q day? Per Gabriel [name] I told pt to take dex premed and I sent refill rx to his pharmacy.

## 2018-06-25 ENCOUNTER — Inpatient Hospital Stay: Payer: 59

## 2018-06-25 ENCOUNTER — Encounter: Payer: Self-pay | Admitting: Oncology

## 2018-06-25 ENCOUNTER — Inpatient Hospital Stay (HOSPITAL_BASED_OUTPATIENT_CLINIC_OR_DEPARTMENT_OTHER): Payer: 59 | Admitting: Oncology

## 2018-06-25 VITALS — BP 129/79 | HR 99 | Temp 97.5°F | Resp 18 | Ht 70.0 in | Wt 99.9 lb

## 2018-06-25 DIAGNOSIS — R634 Abnormal weight loss: Secondary | ICD-10-CM | POA: Diagnosis not present

## 2018-06-25 DIAGNOSIS — Z79899 Other long term (current) drug therapy: Secondary | ICD-10-CM

## 2018-06-25 DIAGNOSIS — C3491 Malignant neoplasm of unspecified part of right bronchus or lung: Secondary | ICD-10-CM

## 2018-06-25 DIAGNOSIS — Z923 Personal history of irradiation: Secondary | ICD-10-CM

## 2018-06-25 DIAGNOSIS — Z85828 Personal history of other malignant neoplasm of skin: Secondary | ICD-10-CM

## 2018-06-25 DIAGNOSIS — M545 Low back pain: Secondary | ICD-10-CM

## 2018-06-25 DIAGNOSIS — R5383 Other fatigue: Secondary | ICD-10-CM

## 2018-06-25 DIAGNOSIS — F17210 Nicotine dependence, cigarettes, uncomplicated: Secondary | ICD-10-CM

## 2018-06-25 DIAGNOSIS — K5900 Constipation, unspecified: Secondary | ICD-10-CM

## 2018-06-25 DIAGNOSIS — J449 Chronic obstructive pulmonary disease, unspecified: Secondary | ICD-10-CM

## 2018-06-25 DIAGNOSIS — M7989 Other specified soft tissue disorders: Secondary | ICD-10-CM | POA: Diagnosis not present

## 2018-06-25 DIAGNOSIS — C3411 Malignant neoplasm of upper lobe, right bronchus or lung: Secondary | ICD-10-CM

## 2018-06-25 DIAGNOSIS — R531 Weakness: Secondary | ICD-10-CM

## 2018-06-25 DIAGNOSIS — Z5111 Encounter for antineoplastic chemotherapy: Secondary | ICD-10-CM | POA: Diagnosis not present

## 2018-06-25 DIAGNOSIS — I10 Essential (primary) hypertension: Secondary | ICD-10-CM

## 2018-06-25 DIAGNOSIS — R197 Diarrhea, unspecified: Secondary | ICD-10-CM

## 2018-06-25 LAB — CBC WITH DIFFERENTIAL (CANCER CENTER ONLY)
BASOS PCT: 0 %
Basophils Absolute: 0 10*3/uL (ref 0.0–0.1)
EOS ABS: 0 10*3/uL (ref 0.0–0.5)
EOS PCT: 0 %
HCT: 35.5 % — ABNORMAL LOW (ref 38.4–49.9)
Hemoglobin: 11.6 g/dL — ABNORMAL LOW (ref 13.0–17.1)
Lymphocytes Relative: 2 %
Lymphs Abs: 0.3 10*3/uL — ABNORMAL LOW (ref 0.9–3.3)
MCH: 29.9 pg (ref 27.2–33.4)
MCHC: 32.6 g/dL (ref 32.0–36.0)
MCV: 91.6 fL (ref 79.3–98.0)
MONOS PCT: 1 %
Monocytes Absolute: 0.2 10*3/uL (ref 0.1–0.9)
Neutro Abs: 11.6 10*3/uL — ABNORMAL HIGH (ref 1.5–6.5)
Neutrophils Relative %: 97 %
PLATELETS: 362 10*3/uL (ref 140–400)
RBC: 3.87 MIL/uL — ABNORMAL LOW (ref 4.20–5.82)
RDW: 16.9 % — AB (ref 11.0–14.6)
WBC Count: 12 10*3/uL — ABNORMAL HIGH (ref 4.0–10.3)

## 2018-06-25 LAB — CMP (CANCER CENTER ONLY)
ALK PHOS: 126 U/L (ref 38–126)
ALT: 16 U/L (ref 0–44)
AST: 18 U/L (ref 15–41)
Albumin: 3.1 g/dL — ABNORMAL LOW (ref 3.5–5.0)
Anion gap: 11 (ref 5–15)
BUN: 30 mg/dL — AB (ref 8–23)
CALCIUM: 9.9 mg/dL (ref 8.9–10.3)
CHLORIDE: 104 mmol/L (ref 98–111)
CO2: 27 mmol/L (ref 22–32)
CREATININE: 0.96 mg/dL (ref 0.61–1.24)
GFR, Est AFR Am: >60 mL/min (ref >60)
Glucose, Bld: 125 mg/dL — ABNORMAL HIGH (ref 70–99)
Potassium: 4 mmol/L (ref 3.5–5.1)
Sodium: 142 mmol/L (ref 135–145)
Total Bilirubin: 0.3 mg/dL (ref 0.3–1.2)
Total Protein: 7.1 g/dL (ref 6.5–8.1)

## 2018-06-25 MED ORDER — SODIUM CHLORIDE 0.9 % IV SOLN
800.0000 mg | Freq: Once | INTRAVENOUS | Status: AC
  Administered 2018-06-25: 800 mg via INTRAVENOUS
  Filled 2018-06-25: qty 20

## 2018-06-25 MED ORDER — PREDNISONE 10 MG PO TABS: ORAL_TABLET | ORAL | 0 refills | Status: DC

## 2018-06-25 MED ORDER — OXYCODONE-ACETAMINOPHEN 5-325 MG PO TABS: 1.0000 | ORAL_TABLET | Freq: Three times a day (TID) | ORAL | 0 refills | Status: DC | PRN

## 2018-06-25 MED ORDER — PROCHLORPERAZINE MALEATE 10 MG PO TABS
ORAL_TABLET | ORAL | Status: AC
  Filled 2018-06-25: qty 1

## 2018-06-25 MED ORDER — CYANOCOBALAMIN 1000 MCG/ML IJ SOLN
INTRAMUSCULAR | Status: AC
  Filled 2018-06-25: qty 1

## 2018-06-25 MED ORDER — SODIUM CHLORIDE 0.9 % IV SOLN
Freq: Once | INTRAVENOUS | Status: AC
  Administered 2018-06-25: 15:00:00 via INTRAVENOUS
  Filled 2018-06-25: qty 250

## 2018-06-25 MED ORDER — PROCHLORPERAZINE MALEATE 10 MG PO TABS
10.0000 mg | ORAL_TABLET | Freq: Once | ORAL | Status: AC
  Administered 2018-06-25: 10 mg via ORAL

## 2018-06-25 MED ORDER — CYANOCOBALAMIN 1000 MCG/ML IJ SOLN
1000.0000 ug | Freq: Once | INTRAMUSCULAR | Status: AC
  Administered 2018-06-25: 1000 ug via INTRAMUSCULAR

## 2018-06-25 NOTE — Assessment & Plan Note (Addendum)
This is a very pleasant 77 year old white male with metastatic non-small cell lung cancer, adenocarcinoma status post short course of concurrent chemoradiation with weekly carboplatin and paclitaxel followed by 1 cycle of consolidation chemotherapy discontinued secondary to intolerance. He was a started on treatment with immunotherapy with Nivolumab 240 mg IV every 2 weeks status post 35 cycles. He tolerated the previous treatment well with no significant adverse effects. His treatment was switched to a Nivolumab 480 mg IV every 4 weeks status post 10 cycles. He tolerated this treatment well except for recent episodes of diarrhea. He was started on high-dose prednisone that was tapered over the last few weeks. He has initial improvement in the diarrhea then he has relapse.  He was again started on high-dose prednisone for his persistent diarrhea. 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 He is currently on second line treatment with single agent Alimta 500 mg/M2 every 3 weeks.  Status post 2 cycles which he is tolerating fairly well with the exception of edema to his feet and ankles.  The patient has worsening dyspnea.  The patient was seen with Dr. Earlie Server.  We discussed that his dyspnea could be related to his COPD.  The patient is currently at 10 mg of prednisone daily and we advised him to increase to 10 mg twice a day.  If his breathing does not improve, recommend that he follow-up with pulmonology.  We discussed that his edema to his feet and ankles could be related to the Alimta versus prednisone use.  He was advised to elevate his legs much as possible and to decrease his sodium intake.  Commend for the patient to continue on his current treatment.  He will proceed with cycle #3 of Alimta today as scheduled.  He will have a restaging  CT scan of the chest, abdomen, pelvis prior to his next visit.  He will follow-up in 3 weeks for evaluation prior to cycle #4 and to review his restaging CT scan results.  For the low back pain, he will continue on Percocet 5/325 mg p.o. every 8 hours as needed for pain.  He was provided a refill today.  He was advised to call immediately if he has any concerning symptoms in the interval. The patient voices understanding of current disease status and treatment options and is in agreement with the current care plan. All questions were answered. The patient knows to call the clinic with any problems, questions or concerns. We can certainly see the patient much sooner if necessary.

## 2018-06-25 NOTE — Patient Instructions (Signed)
[facility] Discharge Instructions for Patients Receiving Chemotherapy  Today you received the following chemotherapy agents pemetrexed (Alimta)  To help prevent nausea and vomiting after your treatment, we encourage you to take your nausea medication as directed by your doctor.    If you develop nausea and vomiting that is not controlled by your nausea medication, call the clinic.   BELOW ARE SYMPTOMS THAT SHOULD BE REPORTED IMMEDIATELY:  *FEVER GREATER THAN 100.5 F  *CHILLS WITH OR WITHOUT FEVER  NAUSEA AND VOMITING THAT IS NOT CONTROLLED WITH YOUR NAUSEA MEDICATION  *UNUSUAL SHORTNESS OF BREATH  *UNUSUAL BRUISING OR BLEEDING  TENDERNESS IN MOUTH AND THROAT WITH OR WITHOUT PRESENCE OF ULCERS  *URINARY PROBLEMS  *BOWEL PROBLEMS  UNUSUAL RASH Items with * indicate a potential emergency and should be followed up as soon as possible.  Feel free to call the clinic should you have any questions or concerns. The clinic phone number is [phone].  Please show the [place] at check-in to the Emergency Department and triage nurse.

## 2018-06-25 NOTE — Progress Notes (Signed)
[facility] OFFICE PROGRESS NOTE  Aura Dials, MD [place] [address]  DIAGNOSIS:Metastatic non-small cell lung cancer, adenocarcinoma initially diagnosed as unresectable stage IIIa (T2a, N2, M0) in July 2016.  Foundation One molecular studies reveal that he was positive for [place], [place], and STAG2 [id number]. He was negative for: EGFR, ALK, BRAF, MET, RET, ERBB2 and ROS1  PRIOR THERAPY: 1) Concurrent chemoradiation with chemotherapy in the form of weekly carboplatin for AUC 2 paclitaxel 45 mg/m given concurrent with radiation therapy. First cycle started 05/09/2015. Status post 7 cycles. 2) Consolidation chemotherapy with carboplatin for AUC of 5 on day 1 and gemcitabine 1000 MG/M2 on days 1 and 8 every 3 weeks. First dose 08/15/2015. Discontinued secondary to intolerance. 3) Immunotherapy with Nivolumab 240 mg IV every 2 weeks, status post 35 cycles. 4)Immunotherapy with Nivolumab 480 mg IV every 4 weeks status post 10 cycles.  CURRENT THERAPY:Systemic chemotherapy with Alimta 500 mg/M2 every 3 weeks. First dose May 14, 2018.  Status post 2 cycles.  INTERVAL HISTORY: Gabriel [id number]. male returns for routine follow-up visit accompanied by his son and daughter.  The patient reports that he is having increased dyspnea on exertion.  His prednisone was recently restarted at 10 mg a day.  He has not noticed much improvement.  He only takes Symbicort but no other inhalers.  He also reports swelling to his feet and ankles.  He denies fevers and chills.  Denies chest pain, cough, hemoptysis.  Denies nausea, vomiting, constipation, diarrhea.  His weight has remained stable and he denies night sweats.  The patient is here for evaluation prior to cycle #3 of his treatment.  MEDICAL HISTORY: Past Medical History:  Diagnosis Date  . Cancer ([place]    skin cancer  . Constipation    AFTER ANESTHESIA  . Difficulty sleeping   .  History of nonmelanoma skin cancer   . Hypertension 10/30/2016  . Inguinal hernia   . Lung mass    right / HAS HAD RADIATION AND CHEMO FOR LUNG CANCER  . Pneumothorax on right 08/24/2015  . Productive cough    "DUE TO RADIATION"  . Radiation 05/11/15-06/21/15   NSCLCA/right lung  . Smoking 1/2 pack a day or less 09/17/2016  . Spontaneous pneumothorax MANY YRS AGO AND AGAIN 04/04/15   HISTORY OF (ONLY)  . Weight loss 04/01/2017    ALLERGIES:  is allergic to aleve [naproxen].  MEDICATIONS:  Current Outpatient Medications  Medication Sig Dispense Refill  . albuterol (PROVENTIL HFA) 108 (90 Base) MCG/ACT inhaler Inhale 2 puffs into the lungs every 6 (six) hours as needed for wheezing or shortness of breath.    . Arginine (RA L-ARGININE) 1000 MG TABS Take 500 mg by mouth daily.     . Ascorbic Acid (VITAMIN C) 1000 MG tablet Take 1,000 mg by mouth daily. Pt takes 2 5036mtabs    . B Complex Vitamins (B COMPLEX PO) Take 1 capsule by mouth daily.     . Marland KitchenALCIUM CITRATE PO Take 1 tablet by mouth daily.    . Cyanocobalamin (VITAMIN B 12 PO) Take 1 tablet by mouth daily.    . Marland Kitchenexamethasone (DECADRON) 4 MG tablet Take 4 mg twice a day the day before, day of and day after chemo 20 tablet 0  . Diphenhyd-Hydrocort-Nystatin (FIRST-DUKES MOUTHWASH) SUSP Take 5 mLs by mouth QID. 110 mL 6  . dronabinol (MARINOL) 2.5 MG capsule Take 1 capsule (2.5 mg  total) by mouth 2 (two) times daily before a meal. 60 capsule 0  . ferrous sulfate 325 (65 FE) MG tablet Take 325 mg by mouth daily with breakfast.    . folic acid (FOLVITE) 1 MG tablet Take 1 tablet (1 mg total) by mouth daily. 30 tablet 4  . guaiFENesin (MUCINEX) 600 MG 12 hr tablet Take 1 tablet (600 mg total) by mouth 2 (two) times daily as needed. (Patient taking differently: Take 1,200 mg by mouth 2 (two) times daily as needed. )    . ibuprofen (ADVIL,MOTRIN) 200 MG tablet Take 200 mg by mouth 3 (three) times daily.    . magnesium 30 MG tablet Take 30 mg by  mouth daily.    . methylphenidate (RITALIN) 5 MG tablet Take 1 tablet a day. 30 tablet 0  . Multiple Vitamin (MULTI VITAMIN DAILY PO) Take 1 tablet by mouth daily.    . Omega-3 Fatty Acids (FISH OIL) 1000 MG CAPS Take 1,000 mg by mouth daily.    Marland Kitchen oxyCODONE-acetaminophen (PERCOCET/ROXICET) 5-325 MG tablet Take 1 tablet by mouth every 8 (eight) hours as needed for severe pain. 45 tablet 0  . predniSONE (DELTASONE) 10 MG tablet 1 tablet twice a day 60 tablet 0  . prochlorperazine (COMPAZINE) 10 MG tablet Take 1 tablet (10 mg total) by mouth every 6 (six) hours as needed for nausea or vomiting. 30 tablet 0  . saccharomyces boulardii (FLORASTOR) 250 MG capsule Take 250 mg by mouth 2 (two) times daily.    . SYMBICORT 160-4.5 MCG/ACT inhaler INHALE 2 PUFFS TWICE DAILY 10.2 Inhaler 5  . temazepam (RESTORIL) 15 MG capsule Take 1 capsule (15 mg total) by mouth at bedtime as needed for sleep. 30 capsule 0  . vitamin A 10000 UNIT capsule 1 capsule with food or milk     No current facility-administered medications for this visit.     SURGICAL HISTORY:  Past Surgical History:  Procedure Laterality Date  . CHEST TUBE INSERTION Right 08/02/2015   Procedure: INSERTION OF SECOND RIGHT CHEST TUBE WITH FLUROSCOPY;  Surgeon: Grace Isaac, MD;  Location: [facility];  Service: Thoracic;  Laterality: Right;  . CHEST TUBE INSERTION  07/28/2015  . COLONOSCOPY W/ BIOPSIES AND POLYPECTOMY    . HERNIA REPAIR  2011  . INGUINAL HERNIA REPAIR Left 07/15/2015   Procedure: OPEN LEFT INGUINAL HERNIA REPAIR;  Surgeon: Johnathan Hausen, MD;  Location: [facility];  Service: General;  Laterality: Left;  With MESH  . TONSILLECTOMY    . VIDEO BRONCHOSCOPY WITH ENDOBRONCHIAL ULTRASOUND N/A 04/18/2015   Procedure: VIDEO BRONCHOSCOPY WITH ENDOBRONCHIAL ULTRASOUND;  Surgeon: Grace Isaac, MD;  Location: [facility];  Service: Thoracic;  Laterality: N/A;  . VIDEO BRONCHOSCOPY WITH INSERTION OF INTERBRONCHIAL VALVE (IBV) N/A 08/02/2015    Procedure: VIDEO BRONCHOSCOPY WITH ATTEMPTED INSERTION OF INTERBRONCHIAL VALVE (IBV) WITH FLUROSCOPY;  Surgeon: Grace Isaac, MD;  Location: [facility];  Service: Thoracic;  Laterality: N/A;  . WISDOM TOOTH EXTRACTION      REVIEW OF SYSTEMS:   Review of Systems  Constitutional: Negative for appetite change, chills, fever and unexpected weight change.  Positive for fatigue. HENT:   Negative for mouth sores, nosebleeds, sore throat and trouble swallowing.   Eyes: Negative for eye problems and icterus.  Respiratory: Negative for cough, hemoptysis, and wheezing.  Positive for shortness of breath with minimal exertion. Cardiovascular: Negative for chest pain and leg swelling.  Gastrointestinal: Negative for abdominal pain, constipation, diarrhea, nausea and vomiting.  Genitourinary:  Negative for bladder incontinence, difficulty urinating, dysuria, frequency and hematuria.   Musculoskeletal: Negative for gait problem, neck pain and neck stiffness.  Positive for low back pain. Skin: Negative for itching and rash.  Neurological: Negative for dizziness, extremity weakness, gait problem, headaches, light-headedness and seizures.  Hematological: Negative for adenopathy. Does not bruise/bleed easily.  Psychiatric/Behavioral: Negative for confusion, depression and sleep disturbance. The patient is not nervous/anxious.     PHYSICAL EXAMINATION:  Blood pressure 129/79, pulse 99, temperature (!) 97.5 F (36.4 C), temperature source Oral, resp. rate 18, height _0  (1.778 m), weight 99 lb 14.4 oz (45.3 kg), SpO2 97 %.  ECOG PERFORMANCE STATUS: 1 - Symptomatic but completely ambulatory  Physical Exam  Constitutional: Oriented to person, place, and time. No distress.  HENT:  Head: Normocephalic and atraumatic.  Mouth/Throat: Oropharynx is clear and moist. No oropharyngeal exudate.  Eyes: Conjunctivae are normal. Right eye exhibits no discharge. Left eye exhibits no discharge. No scleral icterus.   Neck: Normal range of motion. Neck supple.  Cardiovascular: Normal rate, regular rhythm, normal heart sounds and intact distal pulses.   Pulmonary/Chest: Effort normal and breath sounds normal. No respiratory distress. No wheezes. No rales.  Abdominal: Soft. Bowel sounds are normal. Exhibits no distension and no mass. There is no tenderness.  Musculoskeletal: Normal range of motion. Exhibits no edema.  Lymphadenopathy:    No cervical adenopathy.  Neurological: Alert and oriented to person, place, and time. Exhibits normal muscle tone. Gait normal. Coordination normal.  Skin: Skin is warm and dry. No rash noted. Not diaphoretic. No erythema. No pallor.  Psychiatric: Mood, memory and judgment normal.  Vitals reviewed.  LABORATORY DATA: Lab Results  Component Value Date   WBC 12.0 (H) 06/25/2018   HGB 11.6 (L) 06/25/2018   HCT 35.5 (L) 06/25/2018   MCV 91.6 06/25/2018   PLT 362 06/25/2018      Chemistry      Component Value Date/Time   NA 142 06/25/2018 1338   NA 139 10/14/2017 1523   K 4.0 06/25/2018 1338   K 3.9 10/14/2017 1523   CL 104 06/25/2018 1338   CO2 27 06/25/2018 1338   CO2 24 10/14/2017 1523   BUN 30 (H) 06/25/2018 1338   BUN 27.2 (H) 10/14/2017 1523   CREATININE 0.96 06/25/2018 1338   CREATININE 0.9 10/14/2017 1523      Component Value Date/Time   CALCIUM 9.9 06/25/2018 1338   CALCIUM 9.5 10/14/2017 1523   ALKPHOS 126 06/25/2018 1338   ALKPHOS 111 10/14/2017 1523   AST 18 06/25/2018 1338   AST 21 10/14/2017 1523   ALT 16 06/25/2018 1338   ALT 20 10/14/2017 1523   BILITOT 0.3 06/25/2018 1338   BILITOT 0.26 10/14/2017 1523       RADIOGRAPHIC STUDIES:  Dg Chest 1 View  Result Date: 06/18/2018 CLINICAL DATA:  Currently being treated for non-small lung cancer right lung. Shortness of breath 2 weeks. EXAM: CHEST  1 VIEW COMPARISON:  03/03/2018 and 11/26/2016 FINDINGS: Patient is rotated to the right. Stable chronic changes over the right lung/thorax. Mild  prominence of the left perihilar markings. Cardiac silhouette is within normal. Remainder the exam is unchanged. IMPRESSION: No acute findings. Chronic stable changes of the right lung/hemithorax. Electronically Signed   By: Marin Olp M.D.   On: 06/18/2018 15:49     ASSESSMENT/PLAN:  Adenocarcinoma of right lung, stage 4 (HCC) This is a very pleasant 77 year old white male with metastatic non-small cell lung cancer,  adenocarcinoma status post short course of concurrent chemoradiation with weekly carboplatin and paclitaxel followed by 1 cycle of consolidation chemotherapy discontinued secondary to intolerance. He was a started on treatment with immunotherapy with Nivolumab 240 mg IV every 2 weeks status post 35 cycles. He tolerated the previous treatment well with no significant adverse effects. His treatment was switched to a Nivolumab 480 mg IV every 4 weeks status post 10 cycles. He tolerated this treatment well except for recent episodes of diarrhea. He was started on high-dose prednisone that was tapered over the last few weeks. He has initial improvement in the diarrhea then he has relapse.  He was again started on high-dose prednisone for his persistent diarrhea. 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 He is currently on second line treatment with single agent Alimta 500 mg/M2 every 3 weeks.  Status post 2 cycles which he is tolerating fairly well with the exception of edema to his feet and ankles.  The patient has worsening dyspnea.  The patient was seen with Dr. Earlie Server.  We discussed that his dyspnea could be related to his COPD.  The patient is currently at 10 mg of prednisone daily and we advised him to increase to 10 mg twice a day.  If his breathing does not improve, recommend that he follow-up with pulmonology.  We  discussed that his edema to his feet and ankles could be related to the Alimta versus prednisone use.  He was advised to elevate his legs much as possible and to decrease his sodium intake.  Commend for the patient to continue on his current treatment.  He will proceed with cycle #3 of Alimta today as scheduled.  He will have a restaging CT scan of the chest, abdomen, pelvis prior to his next visit.  He will follow-up in 3 weeks for evaluation prior to cycle #4 and to review his restaging CT scan results.  For the low back pain, he will continue on Percocet 5/325 mg p.o. every 8 hours as needed for pain.  He was provided a refill today.  He was advised to call immediately if he has any concerning symptoms in the interval. The patient voices understanding of current disease status and treatment options and is in agreement with the current care plan. All questions were answered. The patient knows to call the clinic with any problems, questions or concerns. We can certainly see the patient much sooner if necessary.   Orders Placed This Encounter  Procedures  . CT ABDOMEN PELVIS W CONTRAST    Standing Status:   Future    Standing Expiration Date:   06/26/2019    Order Specific Question:   If indicated for the ordered procedure, I authorize the administration of contrast media per Radiology protocol    Answer:   Yes    Order Specific Question:   Preferred imaging location?    Answer:   [facility]    Order Specific Question:   Radiology Contrast Protocol - do NOT remove file path    Answer:   \\[email or web address]    Order Specific Question:   ** REASON FOR EXAM (FREE TEXT)    Answer:   Lung cancer. Restaging.  . CT CHEST W CONTRAST    Standing Status:   Future    Standing Expiration Date:   06/26/2019    Order Specific Question:  If indicated for the ordered procedure, I authorize the administration of contrast media per Radiology protocol    Answer:   Yes     Order Specific Question:   Preferred imaging location?    Answer:   [facility]    Order Specific Question:   Radiology Contrast Protocol - do NOT remove file path    Answer:   [email or web address]    Order Specific Question:   ** REASON FOR EXAM (FREE TEXT)    Answer:   Lung cancer. Restaging.     Mikey Bussing, DNP, AGPCNP-BC, AOCNP 06/25/18   Addendum: Hematology/Oncology Attending: I had a face-to-face encounter with the patient.  I recommended his care plan.  This is a very pleasant 77 years old white male with metastatic non-small cell lung cancer, adenocarcinoma status post several chemotherapy regimens as well as immunotherapy was Nivolumab discontinued secondary to intolerance and disease progression.  He is currently undergoing treatment with single agent Alimta status post 2 cycles.  He has been tolerating this treatment well except for swelling of the lower extremities as well as increasing shortness of breath recently.  He has been on treatment with prednisone 10 mg p.o. daily for the last 7 days with some improvement of his breathing but not complete improvement.  He still have shortness of breath with mild exertion. I had a lengthy discussion with the patient and his son and daughter today about his condition and treatment options. I recommended for the patient to proceed with cycle #3 of his treatment today as a scheduled. I will increase his dose of prednisone to 20 mg p.o. daily. I will see the patient back for follow-up visit in 3 weeks for evaluation after repeating CT scan of the chest, abdomen and pelvis for restaging of his disease. If no improvement in his breathing and no evidence for disease progression on the upcoming scans, I will refer him to a pulmonologist Dr. Chase Caller for evaluation and recommendation regarding his COPD. The patient was advised to call immediately if he has any concerning symptoms in the interval.  Disclaimer:  This note was dictated with voice recognition software. Similar sounding words can inadvertently be transcribed and may be missed upon review. Eilleen Kempf, MD 06/26/18

## 2018-06-26 ENCOUNTER — Telehealth: Payer: Self-pay | Admitting: Oncology

## 2018-06-26 NOTE — Telephone Encounter (Signed)
appts already scheduled per 8/28 los.

## 2018-06-27 ENCOUNTER — Telehealth: Payer: Self-pay | Admitting: *Deleted

## 2018-06-27 ENCOUNTER — Encounter: Payer: Self-pay | Admitting: *Deleted

## 2018-06-27 NOTE — Telephone Encounter (Signed)
Pt called states " I need a letter faxed to the [facility] department [phone]. This letter should state that I am able to return to work 07/01/18." Reviewed with MD. Letter signed and faxed

## 2018-06-30 ENCOUNTER — Encounter (HOSPITAL_COMMUNITY): Payer: Self-pay

## 2018-06-30 ENCOUNTER — Other Ambulatory Visit: Payer: Self-pay

## 2018-06-30 ENCOUNTER — Emergency Department (HOSPITAL_COMMUNITY)
Admission: EM | Admit: 2018-06-30 | Discharge: 2018-06-30 | Disposition: A | Discharge status: Home / Self Care | Payer: 59 | Attending: Emergency Medicine | Admitting: Emergency Medicine

## 2018-06-30 DIAGNOSIS — S31000A Unspecified open wound of lower back and pelvis without penetration into retroperitoneum, initial encounter: Secondary | ICD-10-CM

## 2018-06-30 DIAGNOSIS — Z85118 Personal history of other malignant neoplasm of bronchus and lung: Secondary | ICD-10-CM | POA: Insufficient documentation

## 2018-06-30 DIAGNOSIS — L89159 Pressure ulcer of sacral region, unspecified stage: Secondary | ICD-10-CM | POA: Insufficient documentation

## 2018-06-30 DIAGNOSIS — J449 Chronic obstructive pulmonary disease, unspecified: Secondary | ICD-10-CM | POA: Insufficient documentation

## 2018-06-30 DIAGNOSIS — F17210 Nicotine dependence, cigarettes, uncomplicated: Secondary | ICD-10-CM | POA: Diagnosis not present

## 2018-06-30 DIAGNOSIS — R339 Retention of urine, unspecified: Secondary | ICD-10-CM | POA: Diagnosis present

## 2018-06-30 DIAGNOSIS — Z79899 Other long term (current) drug therapy: Secondary | ICD-10-CM | POA: Insufficient documentation

## 2018-06-30 DIAGNOSIS — R1030 Lower abdominal pain, unspecified: Secondary | ICD-10-CM | POA: Diagnosis not present

## 2018-06-30 DIAGNOSIS — I10 Essential (primary) hypertension: Secondary | ICD-10-CM | POA: Insufficient documentation

## 2018-06-30 HISTORY — DX: Malignant neoplasm of unspecified part of unspecified bronchus or lung: C34.90

## 2018-06-30 LAB — URINALYSIS, ROUTINE W REFLEX MICROSCOPIC
BILIRUBIN URINE: NEGATIVE
Glucose, UA: NEGATIVE mg/dL
KETONES UR: NEGATIVE mg/dL
Leukocytes, UA: NEGATIVE
Nitrite: NEGATIVE
PH: 5 (ref 5.0–8.0)
PROTEIN: NEGATIVE mg/dL
Specific Gravity, Urine: 1.018 (ref 1.005–1.030)

## 2018-06-30 LAB — COMPREHENSIVE METABOLIC PANEL
ALBUMIN: 3.4 g/dL — AB (ref 3.5–5.0)
ALK PHOS: 103 U/L (ref 38–126)
ALT: 24 U/L (ref 0–44)
AST: 24 U/L (ref 15–41)
Anion gap: 12 (ref 5–15)
BILIRUBIN TOTAL: 0.9 mg/dL (ref 0.3–1.2)
BUN: 40 mg/dL — AB (ref 8–23)
CO2: 28 mmol/L (ref 22–32)
Calcium: 9.2 mg/dL (ref 8.9–10.3)
Chloride: 99 mmol/L (ref 98–111)
Creatinine, Ser: 1.09 mg/dL (ref 0.61–1.24)
GFR calc Af Amer: >60 mL/min (ref >60)
GFR calc non Af Amer: >60 mL/min (ref >60)
GLUCOSE: 95 mg/dL (ref 70–99)
POTASSIUM: 4 mmol/L (ref 3.5–5.1)
SODIUM: 139 mmol/L (ref 135–145)
TOTAL PROTEIN: 7 g/dL (ref 6.5–8.1)

## 2018-06-30 LAB — CBC WITH DIFFERENTIAL/PLATELET
BASOS ABS: 0 10*3/uL (ref 0.0–0.1)
BASOS PCT: 0 %
Eosinophils Absolute: 0 10*3/uL (ref 0.0–0.7)
Eosinophils Relative: 0 %
HEMATOCRIT: 38.1 % — AB (ref 39.0–52.0)
HEMOGLOBIN: 12.7 g/dL — AB (ref 13.0–17.0)
Lymphocytes Relative: 2 %
Lymphs Abs: 0.3 10*3/uL — ABNORMAL LOW (ref 0.7–4.0)
MCH: 30 pg (ref 26.0–34.0)
MCHC: 33.3 g/dL (ref 30.0–36.0)
MCV: 90.1 fL (ref 78.0–100.0)
Monocytes Absolute: 0 10*3/uL — ABNORMAL LOW (ref 0.1–1.0)
Monocytes Relative: 0 %
NEUTROS PCT: 98 %
Neutro Abs: 14.2 10*3/uL — ABNORMAL HIGH (ref 1.7–7.7)
Platelets: 315 10*3/uL (ref 150–400)
RBC: 4.23 MIL/uL (ref 4.22–5.81)
RDW: 16 % — AB (ref 11.5–15.5)
WBC: 14.6 10*3/uL — AB (ref 4.0–10.5)

## 2018-06-30 LAB — LIPASE, BLOOD: Lipase: 30 U/L (ref 11–51)

## 2018-06-30 NOTE — ED Provider Notes (Signed)
[facility] DEPT Provider Note   CSN: [id number] Arrival date & time: 06/30/18  1440     History   Chief Complaint Chief Complaint  Patient presents with  . Urinary Retention    HPI Gabriel [name] is a 77 y.o. male.  The history is provided by the patient, medical records and a relative. No language interpreter was used.  Abdominal Pain   This is a new problem. The current episode started yesterday. The problem occurs constantly. The problem has not changed since onset.The pain is associated with an unknown factor. The quality of the pain is aching. The pain is moderate. Associated symptoms include diarrhea (resolved) and frequency (before retension). Pertinent negatives include anorexia, fever, nausea, vomiting, constipation, hematuria and headaches. The symptoms are aggravated by palpation. Nothing relieves the symptoms.    Past Medical History:  Diagnosis Date  . Cancer ([place])    skin cancer  . Constipation    AFTER ANESTHESIA  . Difficulty sleeping   . History of nonmelanoma skin cancer   . Hypertension 10/30/2016  . Inguinal hernia   . Lung cancer ([place])   . Lung mass    right / HAS HAD RADIATION AND CHEMO FOR LUNG CANCER  . Pneumothorax on right 08/24/2015  . Productive cough    "DUE TO RADIATION"  . Radiation 05/11/15-06/21/15   NSCLCA/right lung  . Smoking 1/2 pack a day or less 09/17/2016  . Spontaneous pneumothorax MANY YRS AGO AND AGAIN 04/04/15   HISTORY OF (ONLY)  . Weight loss 04/01/2017    Patient Active Problem List   Diagnosis Date Noted  . Adenocarcinoma of right lung, stage 4 ([place]) 04/28/2018  . Diarrhea 03/31/2018  . Chronic cough 03/19/2018  . Goals of care, counseling/discussion 06/24/2017  . Malignant neoplasm of upper lobe of right lung ([place]) 05/27/2017  . H/O vitamin D deficiency 05/22/2017  . Weight loss 04/01/2017  . Protein-calorie malnutrition ([place]) 11/26/2016  . Acute bronchitis 11/12/2016  . Hypertension  10/30/2016  . Smoking 1/2 pack a day or less 09/17/2016  . Neoplastic malignant related fatigue 05/15/2016  . Stage 2 moderate COPD by GOLD classification ([place]) 02/14/2016  . Hoarseness of voice 02/14/2016  . Encounter for antineoplastic immunotherapy 02/01/2016  . Pressure ulcer 09/14/2015  . Palliative care encounter 09/07/2015  . Other fatigue   . Right-sided chest pain 08/31/2015  . COPD exacerbation ([place]) 08/04/2015  . Pulmonary emphysema ([place]) 07/28/2015  . Malignant neoplasm of lung ([place]) 07/28/2015  . Encounter for antineoplastic chemotherapy 05/09/2015  . Non-small cell carcinoma of right lung, stage 3 ([place]) 04/21/2015  . Hemoptysis, unspecified 10/07/2013    Past Surgical History:  Procedure Laterality Date  . CHEST TUBE INSERTION Right 08/02/2015   Procedure: INSERTION OF SECOND RIGHT CHEST TUBE WITH FLUROSCOPY;  Surgeon: Grace Isaac, MD;  Location: [facility];  Service: Thoracic;  Laterality: Right;  . CHEST TUBE INSERTION  07/28/2015  . COLONOSCOPY W/ BIOPSIES AND POLYPECTOMY    . HERNIA REPAIR  2011  . INGUINAL HERNIA REPAIR Left 07/15/2015   Procedure: OPEN LEFT INGUINAL HERNIA REPAIR;  Surgeon: Johnathan Hausen, MD;  Location: [facility];  Service: General;  Laterality: Left;  With MESH  . TONSILLECTOMY    . VIDEO BRONCHOSCOPY WITH ENDOBRONCHIAL ULTRASOUND N/A 04/18/2015   Procedure: VIDEO BRONCHOSCOPY WITH ENDOBRONCHIAL ULTRASOUND;  Surgeon: Grace Isaac, MD;  Location: [facility];  Service: Thoracic;  Laterality: N/A;  . VIDEO BRONCHOSCOPY WITH INSERTION OF INTERBRONCHIAL VALVE (IBV) N/A 08/02/2015  Procedure: VIDEO BRONCHOSCOPY WITH ATTEMPTED INSERTION OF INTERBRONCHIAL VALVE (IBV) WITH FLUROSCOPY;  Surgeon: Grace Isaac, MD;  Location: [facility];  Service: Thoracic;  Laterality: N/A;  . WISDOM TOOTH EXTRACTION          Home Medications    Prior to Admission medications   Medication Sig Start Date End Date Taking? Authorizing Provider  albuterol (PROVENTIL HFA)  108 (90 Base) MCG/ACT inhaler Inhale 2 puffs into the lungs every 6 (six) hours as needed for wheezing or shortness of breath.    [provider]  Arginine (RA L-ARGININE) 1000 MG TABS Take 500 mg by mouth daily.     [provider]  Ascorbic Acid (VITAMIN C) 1000 MG tablet Take 1,000 mg by mouth daily. Pt takes 2 500mg  tabs    [provider]  B Complex Vitamins (B COMPLEX PO) Take 1 capsule by mouth daily.     [provider]  CALCIUM CITRATE PO Take 1 tablet by mouth daily.    [provider]  Cyanocobalamin (VITAMIN B 12 PO) Take 1 tablet by mouth daily.    [provider]  dexamethasone (DECADRON) 4 MG tablet Take 4 mg twice a day the day before, day of and day after chemo 06/24/18   Curt Bears, MD  Diphenhyd-Hydrocort-Nystatin (FIRST-DUKES MOUTHWASH) SUSP Take 5 mLs by mouth QID. 04/03/18   Brand Males, MD  dronabinol (MARINOL) 2.5 MG capsule Take 1 capsule (2.5 mg total) by mouth 2 (two) times daily before a meal. 03/03/18   Curt Bears, MD  ferrous sulfate 325 (65 FE) MG tablet Take 325 mg by mouth daily with breakfast.    [provider]  folic acid (FOLVITE) 1 MG tablet Take 1 tablet (1 mg total) by mouth daily. 04/28/18   Curt Bears, MD  guaiFENesin (MUCINEX) 600 MG 12 hr tablet Take 1 tablet (600 mg total) by mouth 2 (two) times daily as needed. Patient taking differently: Take 1,200 mg by mouth 2 (two) times daily as needed.  09/14/15   Nani Skillern, PA-C  ibuprofen (ADVIL,MOTRIN) 200 MG tablet Take 200 mg by mouth 3 (three) times daily.    [provider]  magnesium 30 MG tablet Take 30 mg by mouth daily.    [provider]  methylphenidate (RITALIN) 5 MG tablet Take 1 tablet a day. 06/04/18   Maryanna Shape, NP  Multiple Vitamin (MULTI VITAMIN DAILY PO) Take 1 tablet by mouth daily.    [provider]  Omega-3 Fatty Acids (FISH OIL) 1000 MG CAPS Take 1,000 mg by mouth  daily.    [provider]  oxyCODONE-acetaminophen (PERCOCET/ROXICET) 5-325 MG tablet Take 1 tablet by mouth every 8 (eight) hours as needed for severe pain. 06/25/18   Maryanna Shape, NP  predniSONE (DELTASONE) 10 MG tablet 1 tablet twice a day 06/25/18   Maryanna Shape, NP  prochlorperazine (COMPAZINE) 10 MG tablet Take 1 tablet (10 mg total) by mouth every 6 (six) hours as needed for nausea or vomiting. 04/28/18   Curt Bears, MD  saccharomyces boulardii (FLORASTOR) 250 MG capsule Take 250 mg by mouth 2 (two) times daily.    [provider]  SYMBICORT 160-4.5 MCG/ACT inhaler INHALE 2 PUFFS TWICE DAILY 03/31/18   Brand Males, MD  temazepam (RESTORIL) 15 MG capsule Take 1 capsule (15 mg total) by mouth at bedtime as needed for sleep. 04/28/18   Curt Bears, MD  vitamin A 10000 UNIT capsule 1 capsule with food  or milk    [provider]    Family History Family History  Problem Relation Age of Onset  . Heart attack Father   . Heart disease Father   . Hypertension Mother     Social History Social History   Tobacco Use  . Smoking status: Current Every Day Smoker    Packs/day: 0.25    Years: 55.00    Pack years: 13.75    Types: Cigarettes  . Smokeless tobacco: Never Used  . Tobacco comment: 1/2 ppd 7.25.18 ee  Substance Use Topics  . Alcohol use: Yes    Alcohol/week: 0.0 standard drinks    Comment: every other night -social  . Drug use: No     Allergies   Aleve [naproxen]   Review of Systems Review of Systems  Constitutional: Negative for chills, diaphoresis, fatigue and fever.  Respiratory: Negative for cough, chest tightness and shortness of breath.   Cardiovascular: Negative for chest pain.  Gastrointestinal: Positive for abdominal distention, abdominal pain and diarrhea (resolved). Negative for anorexia, constipation, nausea and vomiting.  Genitourinary: Positive for decreased urine volume, difficulty urinating and frequency  (before retension). Negative for flank pain and hematuria.  Musculoskeletal: Negative for back pain, neck pain and neck stiffness.  Skin: Negative for rash and wound.  Neurological: Negative for headaches.  Psychiatric/Behavioral: Negative for agitation.  All other systems reviewed and are negative.    Physical Exam Updated Vital Signs BP 114/80 (BP Location: Right Arm)   Pulse (!) 103   Temp (!) 97.3 F (36.3 C) (Oral)   Resp 16   SpO2 100%   Physical Exam  Constitutional: He appears well-developed and well-nourished. No distress.  HENT:  Head: Normocephalic and atraumatic.  Mouth/Throat: Oropharynx is clear and moist. No oropharyngeal exudate.  Eyes: Pupils are equal, round, and reactive to light. Conjunctivae and EOM are normal.  Neck: Neck supple.  Cardiovascular: Normal rate and regular rhythm.  No murmur heard. Pulmonary/Chest: Effort normal. No respiratory distress. He has wheezes (raint).  Abdominal: Soft. He exhibits distension. There is tenderness.  Musculoskeletal: He exhibits no edema.  Neurological: He is alert. No sensory deficit. He exhibits normal muscle tone.  Skin: Skin is warm and dry. Capillary refill takes less than 2 seconds. Rash noted. He is not diaphoretic. No erythema. No pallor.     Psychiatric: He has a normal mood and affect.  Nursing note and vitals reviewed.    ED Treatments / Results  Labs (all labs ordered are listed, but only abnormal results are displayed) Labs Reviewed  CBC WITH DIFFERENTIAL/PLATELET - Abnormal; Notable for the following components:      Result Value   WBC 14.6 (*)    Hemoglobin 12.7 (*)    HCT 38.1 (*)    RDW 16.0 (*)    Neutro Abs 14.2 (*)    Lymphs Abs 0.3 (*)    Monocytes Absolute 0.0 (*)    All other components within normal limits  COMPREHENSIVE METABOLIC PANEL - Abnormal; Notable for the following components:   BUN 40 (*)    Albumin 3.4 (*)    All other components within normal limits  URINALYSIS,  ROUTINE W REFLEX MICROSCOPIC - Abnormal; Notable for the following components:   Hgb urine dipstick MODERATE (*)    Bacteria, UA RARE (*)    All other components within normal limits  URINE CULTURE  LIPASE, BLOOD    EKG None  Radiology No results found.  Procedures Procedures (including critical care time)  Medications Ordered in ED Medications - No data to display   Initial Impression / Assessment and Plan / ED Course  I have reviewed the triage vital signs and the nursing notes.  Pertinent labs & imaging results that were available during my care of the patient were reviewed by me and considered in my medical decision making (see chart for details).     TAYLAN MAREZ is a 77 y.o. male with a past medical history significant for lung cancer status post radiation and chemotherapy, COPD, and hypertension who presents for decreased urination.  Patient says that he had diarrhea several days ago and was started on Robinul and Imodium.  He says that his last urination was yesterday and he feels that his urine is stopped up.  He says his abdomen is more swollen and tender and he feels that he cannot pee.  He denies any flank pain or flank tenderness.  He denies nausea or vomiting.  He denies any fevers, chills.  He does report that before he stopped urinating yesterday he was having urinary frequency.  He denies other complaints on arrival.  On exam, patient's abdomen is tender in the lower abdomen.  A swollen bladder was palpated.  Lungs were coarse however he had no respiratory complaints.  CVA areas nontender.  Flanks nontender.  Patient appears comfortable despite abdominal discomfort.  Clinically I suspect patient's medications for the diarrhea caused acute urinary retention.  Also considering UTI with the previous frequency.  Patient will screen laboratory testing to look for acute kidney injury or other elect light abnormality as well.  Bladder scan will be performed.  If there is  retention, Foley catheter will be placed.  Anticipate discharge if retention is resolved and work-up is reassuring.  3:53 PM  Nursing reports the patient's bladder scan was over 300 mL.  Patient will have Foley placed and urine will be collected.  5:11 PM Patient's discomfort was relieved after the catheter was placed.  No further abdominal pain.  Urine showed no nitrites or leukocytes, doubt infection.  Culture was sent.  Blood testing showed mild leukocytosis and improved anemia.  CMP showed normal kidney function and electrolytes.  Given patient's resolved symptoms, we feel he is safe for discharge home.  Patient will follow with urology for catheter removal in the next week.  I suspect his retention is due to the Robinul which he took for the diarrhea.    Patient will follow-up with his PCP for further management of his diarrhea and a sacral wound which we discovered.  He reports that he has had a discomfort in his low back and sacrum for the last few weeks and over the last few days it had worsened.  On my inspection he has a 3 mm likely stage II wound that just barely broke the skin.  No evidence of infection currently.  Wound will be dressed with a Mepilex and padding.  Patient will follow-up with PCP for this.    Patient and family understood plan of care and had no other worsens or concerns.  Patient was discharged in good condition with resolution of his retention.   Final Clinical Impressions(s) / ED Diagnoses   Final diagnoses:  Urinary retention  Wound of sacral region, initial encounter  Lower abdominal pain    ED Discharge Orders    None      Clinical Impression: 1. Urinary retention   2. Wound of sacral region, initial encounter   3. Lower abdominal pain  Disposition: Discharge  Condition: Good  I have discussed the results, Dx and Tx plan with the pt(& family if present). He/she/they expressed understanding and agree(s) with the plan. Discharge instructions  discussed at great length. Strict return precautions discussed and pt &/or family have verbalized understanding of the instructions. No further questions at time of discharge.    New Prescriptions   No medications on file    Follow Up: [facility] [place]    [address], [place] [address] [place] DEPT [place] [id number] [place] [address] [place]       Cashay Manganelli, Gwenyth Allegra, MD 06/30/18 1714

## 2018-06-30 NOTE — ED Provider Notes (Signed)
Signed up for patient and not in room   Gabriel Boss, MD 06/30/18 1455

## 2018-06-30 NOTE — Discharge Instructions (Signed)
Your work-up today was consistent with acute urinary retention likely due to the medications we discussed.  Please hold that medication until you see your PCP.  Please leave the catheter in place until you follow-up with urology this week.  Please follow-up with your primary doctor for the sacral wound we discovered and keep it dressed.  Please watch for signs and symptoms of infection.  If any symptoms change or worsen, please return to the nearest emergency department.

## 2018-06-30 NOTE — ED Triage Notes (Signed)
He c/o unable to urinate since yesterday midday. He tells me he has lung cancer; and has been recently taking Robinul for diarrhea. He is in no distress, but tells me he is very uncomfortable.

## 2018-07-01 ENCOUNTER — Telehealth: Payer: Self-pay | Admitting: Medical Oncology

## 2018-07-01 LAB — URINE CULTURE: Culture: NO GROWTH

## 2018-07-01 NOTE — Telephone Encounter (Signed)
Followup  ED visit and urinary retention- unable to get in touch with pt or children.

## 2018-07-03 ENCOUNTER — Telehealth: Payer: Self-pay | Admitting: Medical Oncology

## 2018-07-03 NOTE — Telephone Encounter (Signed)
Pt said he started the Robinul per Dr Oletta Lamas . He stopped it because it may have contributed to his  urinary retention ( see ED note) I told him to contact Dr Oletta Lamas and let him know he stopped taking it.Marland Kitchen

## 2018-07-03 NOTE — Telephone Encounter (Signed)
LVM that there is no nonsystemic steroid that Gabriel [name] is aware of and that Dr Oletta Lamas only recommendation was to add Miralax .

## 2018-07-03 NOTE — Telephone Encounter (Signed)
Diarrhea-saw Dr Oletta Lamas who recommend a non systemic steroid for pt asking if Julien Nordmann will prescribe. I called Anderson Malta ,at [facility] and she will fax note .SHe said the only recommendation Dr Oletta Lamas made was to take Miralax.

## 2018-07-09 ENCOUNTER — Encounter: Payer: Self-pay | Admitting: *Deleted

## 2018-07-14 ENCOUNTER — Other Ambulatory Visit: Payer: Self-pay | Admitting: Internal Medicine

## 2018-07-14 ENCOUNTER — Telehealth: Payer: Self-pay | Admitting: Medical Oncology

## 2018-07-14 DIAGNOSIS — C3491 Malignant neoplasm of unspecified part of right bronchus or lung: Secondary | ICD-10-CM

## 2018-07-14 MED ORDER — OXYCODONE-ACETAMINOPHEN 5-325 MG PO TABS: 1.0000 | ORAL_TABLET | Freq: Three times a day (TID) | ORAL | 0 refills | Status: DC | PRN

## 2018-07-14 NOTE — Telephone Encounter (Signed)
Requests oxycodone refill and to be escribed. Pt only received 21 tablets on 8//28 due to insurance limitations . He can now receive #45 tablets,

## 2018-07-15 ENCOUNTER — Ambulatory Visit (HOSPITAL_COMMUNITY)
Admission: RE | Admit: 2018-07-15 | Discharge: 2018-07-15 | Disposition: A | Discharge status: Home / Self Care | Payer: 59 | Source: Ambulatory Visit | Attending: Oncology | Admitting: Oncology

## 2018-07-15 DIAGNOSIS — I700 Atherosclerosis of aorta: Secondary | ICD-10-CM | POA: Insufficient documentation

## 2018-07-15 DIAGNOSIS — C3491 Malignant neoplasm of unspecified part of right bronchus or lung: Secondary | ICD-10-CM | POA: Diagnosis not present

## 2018-07-15 DIAGNOSIS — C7951 Secondary malignant neoplasm of bone: Secondary | ICD-10-CM | POA: Insufficient documentation

## 2018-07-15 MED ORDER — IOHEXOL 300 MG/ML  SOLN
100.0000 mL | Freq: Once | INTRAMUSCULAR | Status: AC | PRN
  Administered 2018-07-15: 80 mL via INTRAVENOUS

## 2018-07-16 ENCOUNTER — Inpatient Hospital Stay (HOSPITAL_BASED_OUTPATIENT_CLINIC_OR_DEPARTMENT_OTHER): Payer: 59 | Admitting: Oncology

## 2018-07-16 ENCOUNTER — Inpatient Hospital Stay: Payer: 59 | Attending: Oncology

## 2018-07-16 ENCOUNTER — Encounter: Payer: Self-pay | Admitting: Oncology

## 2018-07-16 ENCOUNTER — Inpatient Hospital Stay: Payer: 59

## 2018-07-16 VITALS — BP 156/86 | HR 101 | Temp 97.9°F | Resp 18 | Ht 70.0 in | Wt 100.7 lb

## 2018-07-16 VITALS — HR 81

## 2018-07-16 DIAGNOSIS — M545 Low back pain: Secondary | ICD-10-CM | POA: Insufficient documentation

## 2018-07-16 DIAGNOSIS — N400 Enlarged prostate without lower urinary tract symptoms: Secondary | ICD-10-CM | POA: Diagnosis not present

## 2018-07-16 DIAGNOSIS — Z923 Personal history of irradiation: Secondary | ICD-10-CM

## 2018-07-16 DIAGNOSIS — I10 Essential (primary) hypertension: Secondary | ICD-10-CM

## 2018-07-16 DIAGNOSIS — C7951 Secondary malignant neoplasm of bone: Secondary | ICD-10-CM | POA: Diagnosis not present

## 2018-07-16 DIAGNOSIS — Z5111 Encounter for antineoplastic chemotherapy: Secondary | ICD-10-CM

## 2018-07-16 DIAGNOSIS — R590 Localized enlarged lymph nodes: Secondary | ICD-10-CM | POA: Insufficient documentation

## 2018-07-16 DIAGNOSIS — C3491 Malignant neoplasm of unspecified part of right bronchus or lung: Secondary | ICD-10-CM

## 2018-07-16 DIAGNOSIS — Z5112 Encounter for antineoplastic immunotherapy: Secondary | ICD-10-CM | POA: Diagnosis not present

## 2018-07-16 DIAGNOSIS — Z9221 Personal history of antineoplastic chemotherapy: Secondary | ICD-10-CM | POA: Insufficient documentation

## 2018-07-16 DIAGNOSIS — C3411 Malignant neoplasm of upper lobe, right bronchus or lung: Secondary | ICD-10-CM

## 2018-07-16 DIAGNOSIS — Z79899 Other long term (current) drug therapy: Secondary | ICD-10-CM | POA: Diagnosis not present

## 2018-07-16 DIAGNOSIS — F17210 Nicotine dependence, cigarettes, uncomplicated: Secondary | ICD-10-CM | POA: Diagnosis not present

## 2018-07-16 DIAGNOSIS — Z7189 Other specified counseling: Secondary | ICD-10-CM

## 2018-07-16 DIAGNOSIS — R197 Diarrhea, unspecified: Secondary | ICD-10-CM | POA: Insufficient documentation

## 2018-07-16 DIAGNOSIS — I700 Atherosclerosis of aorta: Secondary | ICD-10-CM

## 2018-07-16 DIAGNOSIS — Z85828 Personal history of other malignant neoplasm of skin: Secondary | ICD-10-CM | POA: Insufficient documentation

## 2018-07-16 LAB — CBC WITH DIFFERENTIAL (CANCER CENTER ONLY)
Basophils Absolute: 0 10*3/uL (ref 0.0–0.1)
Basophils Relative: 0 %
EOS PCT: 0 %
Eosinophils Absolute: 0 10*3/uL (ref 0.0–0.5)
HCT: 32.2 % — ABNORMAL LOW (ref 38.4–49.9)
Hemoglobin: 10.6 g/dL — ABNORMAL LOW (ref 13.0–17.1)
LYMPHS ABS: 0.2 10*3/uL — AB (ref 0.9–3.3)
LYMPHS PCT: 2 %
MCH: 30.7 pg (ref 27.2–33.4)
MCHC: 33 g/dL (ref 32.0–36.0)
MCV: 93.1 fL (ref 79.3–98.0)
MONO ABS: 0.2 10*3/uL (ref 0.1–0.9)
Monocytes Relative: 2 %
Neutro Abs: 10.8 10*3/uL — ABNORMAL HIGH (ref 1.5–6.5)
Neutrophils Relative %: 96 %
PLATELETS: 387 10*3/uL (ref 140–400)
RBC: 3.46 MIL/uL — AB (ref 4.20–5.82)
RDW: 17.5 % — ABNORMAL HIGH (ref 11.0–14.6)
WBC: 11.2 10*3/uL — AB (ref 4.0–10.3)

## 2018-07-16 LAB — CMP (CANCER CENTER ONLY)
ALK PHOS: 95 U/L (ref 38–126)
ALT: 12 U/L (ref 0–44)
AST: 13 U/L — AB (ref 15–41)
Albumin: 3.3 g/dL — ABNORMAL LOW (ref 3.5–5.0)
Anion gap: 11 (ref 5–15)
BUN: 23 mg/dL (ref 8–23)
CALCIUM: 9.2 mg/dL (ref 8.9–10.3)
CHLORIDE: 106 mmol/L (ref 98–111)
CO2: 28 mmol/L (ref 22–32)
Creatinine: 0.93 mg/dL (ref 0.61–1.24)
GFR, Estimated: >60 mL/min (ref >60)
GLUCOSE: 126 mg/dL — AB (ref 70–99)
Potassium: 3.7 mmol/L (ref 3.5–5.1)
SODIUM: 145 mmol/L (ref 135–145)
Total Bilirubin: 0.3 mg/dL (ref 0.3–1.2)
Total Protein: 6.5 g/dL (ref 6.5–8.1)

## 2018-07-16 MED ORDER — PROCHLORPERAZINE MALEATE 10 MG PO TABS
10.0000 mg | ORAL_TABLET | Freq: Once | ORAL | Status: AC
  Administered 2018-07-16: 10 mg via ORAL

## 2018-07-16 MED ORDER — ONDANSETRON HCL 8 MG PO TABS: 8.0000 mg | ORAL_TABLET | Freq: Three times a day (TID) | ORAL | 1 refills | Status: DC | PRN

## 2018-07-16 MED ORDER — PROCHLORPERAZINE MALEATE 10 MG PO TABS
ORAL_TABLET | ORAL | Status: AC
  Filled 2018-07-16: qty 1

## 2018-07-16 MED ORDER — SODIUM CHLORIDE 0.9 % IV SOLN
800.0000 mg | Freq: Once | INTRAVENOUS | Status: AC
  Administered 2018-07-16: 800 mg via INTRAVENOUS
  Filled 2018-07-16: qty 20

## 2018-07-16 MED ORDER — SODIUM CHLORIDE 0.9 % IV SOLN
Freq: Once | INTRAVENOUS | Status: AC
  Administered 2018-07-16: 16:00:00 via INTRAVENOUS
  Filled 2018-07-16: qty 250

## 2018-07-16 MED ORDER — OXYCODONE-ACETAMINOPHEN 5-325 MG PO TABS: 1.0000 | ORAL_TABLET | Freq: Three times a day (TID) | ORAL | 0 refills | Status: DC | PRN

## 2018-07-16 MED ORDER — OXYCODONE-ACETAMINOPHEN 5-325 MG PO TABS: 1.0000 | ORAL_TABLET | Freq: Three times a day (TID) | ORAL | 0 refills | Status: AC | PRN

## 2018-07-16 NOTE — Patient Instructions (Signed)
Docetaxel injection What is this medicine? DOCETAXEL (doe se TAX el) is a chemotherapy drug. It targets fast dividing cells, like cancer cells, and causes these cells to die. This medicine is used to treat many types of cancers like breast cancer, certain stomach cancers, head and neck cancer, lung cancer, and prostate cancer. This medicine may be used for other purposes; ask your health care provider or pharmacist if you have questions. COMMON BRAND NAME(S): Docefrez, Taxotere What should I tell my health care provider before I take this medicine? They need to know if you have any of these conditions: -infection (especially a virus infection such as chickenpox, cold sores, or herpes) -liver disease -low blood counts, like low white cell, platelet, or red cell counts -an unusual or allergic reaction to docetaxel, polysorbate 80, other chemotherapy agents, other medicines, foods, dyes, or preservatives -pregnant or trying to get pregnant -breast-feeding How should I use this medicine? This drug is given as an infusion into a vein. It is administered in a hospital or clinic by a specially trained health care professional. Talk to your pediatrician regarding the use of this medicine in children. Special care may be needed. Overdosage: If you think you have taken too much of this medicine contact a poison control center or emergency room at once. NOTE: This medicine is only for you. Do not share this medicine with others. What if I miss a dose? It is important not to miss your dose. Call your doctor or health care professional if you are unable to keep an appointment. What may interact with this medicine? -cyclosporine -erythromycin -ketoconazole -medicines to increase blood counts like filgrastim, pegfilgrastim, sargramostim -vaccines Talk to your doctor or health care professional before taking any of these medicines: -acetaminophen -aspirin -ibuprofen -ketoprofen -naproxen This list  may not describe all possible interactions. Give your health care provider a list of all the medicines, herbs, non-prescription drugs, or dietary supplements you use. Also tell them if you smoke, drink alcohol, or use illegal drugs. Some items may interact with your medicine. What should I watch for while using this medicine? Your condition will be monitored carefully while you are receiving this medicine. You will need important blood work done while you are taking this medicine. This drug may make you feel generally unwell. This is not uncommon, as chemotherapy can affect healthy cells as well as cancer cells. Report any side effects. Continue your course of treatment even though you feel ill unless your doctor tells you to stop. In some cases, you may be given additional medicines to help with side effects. Follow all directions for their use. Call your doctor or health care professional for advice if you get a fever, chills or sore throat, or other symptoms of a cold or flu. Do not treat yourself. This drug decreases your body's ability to fight infections. Try to avoid being around people who are sick. This medicine may increase your risk to bruise or bleed. Call your doctor or health care professional if you notice any unusual bleeding. This medicine may contain alcohol in the product. You may get drowsy or dizzy. Do not drive, use machinery, or do anything that needs mental alertness until you know how this medicine affects you. Do not stand or sit up quickly, especially if you are an older patient. This reduces the risk of dizzy or fainting spells. Avoid alcoholic drinks. Do not become pregnant while taking this medicine. Women should inform their doctor if they wish to become pregnant or   think they might be pregnant. There is a potential for serious side effects to an unborn child. Talk to your health care professional or pharmacist for more information. Do not breast-feed an infant while taking  this medicine. What side effects may I notice from receiving this medicine? Side effects that you should report to your doctor or health care professional as soon as possible: -allergic reactions like skin rash, itching or hives, swelling of the face, lips, or tongue -low blood counts - This drug may decrease the number of white blood cells, red blood cells and platelets. You may be at increased risk for infections and bleeding. -signs of infection - fever or chills, cough, sore throat, pain or difficulty passing urine -signs of decreased platelets or bleeding - bruising, pinpoint red spots on the skin, black, tarry stools, nosebleeds -signs of decreased red blood cells - unusually weak or tired, fainting spells, lightheadedness -breathing problems -fast or irregular heartbeat -low blood pressure -mouth sores -nausea and vomiting -pain, swelling, redness or irritation at the injection site -pain, tingling, numbness in the hands or feet -swelling of the ankle, feet, hands -weight gain Side effects that usually do not require medical attention (report to your doctor or health care professional if they continue or are bothersome): -bone pain -complete hair loss including hair on your head, underarms, pubic hair, eyebrows, and eyelashes -diarrhea -excessive tearing -changes in the color of fingernails -loosening of the fingernails -nausea -muscle pain -red flush to skin -sweating -weak or tired This list may not describe all possible side effects. Call your doctor for medical advice about side effects. You may report side effects to FDA at 1-800-FDA-1088. Where should I keep my medicine? This drug is given in a hospital or clinic and will not be stored at home. NOTE: This sheet is a summary. It may not cover all possible information. If you have questions about this medicine, talk to your doctor, pharmacist, or health care provider.  2018 [employer] Standard (2015-11-17  12:32:56)\   Ramucirumab injection What is this medicine? RAMUCIRUMAB (ra mue SIR ue mab) is a monoclonal antibody. It is used to treat stomach cancer, colorectal cancer, or lung cancer. This medicine may be used for other purposes; ask your health care provider or pharmacist if you have questions. COMMON BRAND NAME(S): Cyramza What should I tell my health care provider before I take this medicine? They need to know if you have any of these conditions: -bleeding disorders -blood clots -heart disease, including heart failure, heart attack, or chest pain (angina) -high blood pressure -infection (especially a virus infection such as chickenpox, cold sores, or herpes) -protein in your urine -recent surgery -stroke -an unusual or allergic reaction to ramucirumab, other medicines, foods, dyes, or preservatives -pregnant or trying to get pregnant -breast-feeding How should I use this medicine? This medicine is for infusion into a vein. It is given by a health care professional in a hospital or clinic setting. Talk to your pediatrician regarding the use of this medicine in children. Special care may be needed. Overdosage: If you think you have taken too much of this medicine contact a poison control center or emergency room at once. NOTE: This medicine is only for you. Do not share this medicine with others. What if I miss a dose? It is important not to miss your dose. Call your doctor or health care professional if you are unable to keep an appointment. What may interact with this medicine? Interactions have not been studied. This list  may not describe all possible interactions. Give your health care provider a list of all the medicines, herbs, non-prescription drugs, or dietary supplements you use. Also tell them if you smoke, drink alcohol, or use illegal drugs. Some items may interact with your medicine. What should I watch for while using this medicine? Your condition will be monitored  carefully while you are receiving this medicine. You will need to to check your blood pressure and have your blood and urine tested while you are taking this medicine. Your condition will be monitored carefully while you are receiving this medicine. This medicine may increase your risk to bruise or bleed. Call your doctor or health care professional if you notice any unusual bleeding. This medicine may rarely cause 'gastrointestinal perforation' (holes in the stomach, intestines or colon), a serious side effect requiring surgery to repair. This medicine should be started at least 28 days following major surgery and the site of the surgery should be totally healed. Check with your doctor before scheduling dental work or surgery while you are receiving this treatment. Talk to your doctor if you have recently had surgery or if you have a wound that has not healed. Do not become pregnant while taking this medicine or for 3 months after stopping it. Women should inform their doctor if they wish to become pregnant or think they might be pregnant. There is a potential for serious side effects to an unborn child. Talk to your health care professional or pharmacist for more information. What side effects may I notice from receiving this medicine? Side effects that you should report to your doctor or health care professional as soon as possible: -allergic reactions like skin rash, itching or hives, breathing problems, swelling of the face, lips, or tongue -signs of infection - fever or chills, cough, sore throat -chest pain or chest tightness -confusion -dizziness -feeling faint or lightheaded, falls -severe abdominal pain -severe nausea, vomiting -signs and symptoms of bleeding such as bloody or black, tarry stools; red or dark-brown urine; spitting up blood or brown material that looks like coffee grounds; red spots on the skin; unusual bruising or bleeding from the eye, gums, or nose -signs and symptoms of  a blood clot such as breathing problems; changes in vision; chest pain; severe, sudden headache; pain, swelling, warmth in the leg; trouble speaking; sudden numbness or weakness of the face, arm or leg -symptoms of a stroke: change in mental awareness, inability to talk or move one side of the body -trouble walking, dizziness, loss of balance or coordination Side effects that usually do not require medical attention (report to your doctor or health care professional if they continue or are bothersome): -cold, clammy skin -constipation -diarrhea -headache -nausea, vomiting -stomach pain -unusually slow heartbeat -unusually weak or tired This list may not describe all possible side effects. Call your doctor for medical advice about side effects. You may report side effects to FDA at 1-800-FDA-1088. Where should I keep my medicine? This drug is given in a hospital or clinic and will not be stored at home. NOTE: This sheet is a summary. It may not cover all possible information. If you have questions about this medicine, talk to your doctor, pharmacist, or health care provider.  2018 [employer] Standard (2015-11-17 08:20:29)

## 2018-07-16 NOTE — Progress Notes (Signed)
[facility] OFFICE PROGRESS NOTE  Aura Dials, MD [place] [address]  DIAGNOSIS:Metastatic non-small cell lung cancer, adenocarcinoma initially diagnosed as unresectable stage IIIa (T2a, N2, M0) in July 2016.  Foundation One molecular studies reveal that he was positive for [place], Greasy, and STAG2 [id number]. He was negative for: EGFR, ALK, BRAF, MET, RET, ERBB2 and ROS1  PRIOR THERAPY: 1) Concurrent chemoradiation with chemotherapy in the form of weekly carboplatin for AUC 2 paclitaxel 45 mg/m given concurrent with radiation therapy. First cycle started 05/09/2015. Status post 7 cycles. 2) Consolidation chemotherapy with carboplatin for AUC of 5 on day 1 and gemcitabine 1000 MG/M2 on days 1 and 8 every 3 weeks. First dose 08/15/2015. Discontinued secondary to intolerance. 3) Immunotherapy with Nivolumab 240 mg IV every 2 weeks, status post 35 cycles. 4)Immunotherapy with Nivolumab 480 mg IV every 4 weeks status post 10 cycles.  CURRENT THERAPY:Systemic chemotherapy with Alimta 500 mg/M2 every 3 weeks. First dose May 14, 2018.Status post 3 cycles.  INTERVAL HISTORY: Gabriel ROKER77y.o. male returns for routine follow-up visit accompanied by his daughter and son.  The patient reports that his breathing is better since he has been taking prednisone 20 mg daily.  He has a little bit more energy and is eating better.  He has gained back some weight.  He denies fevers and chills.  Denies chest pain and hemoptysis.  He has an ongoing cough which is unchanged.  Denies nausea, vomiting, constipation, diarrhea.  The patient had a restaging CT scan and is here to discuss the results.  MEDICAL HISTORY: Past Medical History:  Diagnosis Date  . Cancer ([facility]    skin cancer  . Constipation    AFTER ANESTHESIA  . Difficulty sleeping   . History of nonmelanoma skin cancer   . Hypertension 10/30/2016  . Inguinal hernia   . Lung  cancer ([facility]   . Lung mass    right / HAS HAD RADIATION AND CHEMO FOR LUNG CANCER  . Pneumothorax on right 08/24/2015  . Productive cough    "DUE TO RADIATION"  . Radiation 05/11/15-06/21/15   NSCLCA/right lung  . Smoking 1/2 pack a day or less 09/17/2016  . Spontaneous pneumothorax MANY YRS AGO AND AGAIN 04/04/15   HISTORY OF (ONLY)  . Weight loss 04/01/2017    ALLERGIES:  is allergic to aleve [naproxen].  MEDICATIONS:  Current Outpatient Medications  Medication Sig Dispense Refill  . albuterol (PROVENTIL HFA) 108 (90 Base) MCG/ACT inhaler Inhale 2 puffs into the lungs every 6 (six) hours as needed for wheezing or shortness of breath.    . Arginine (RA L-ARGININE) 1000 MG TABS Take 500 mg by mouth daily.     . Ascorbic Acid (VITAMIN C) 1000 MG tablet Take 1,000 mg by mouth daily. Pt takes 2 5062mtabs    . B Complex Vitamins (B COMPLEX PO) Take 1 capsule by mouth daily.     . Marland KitchenALCIUM CITRATE PO Take 1 tablet by mouth daily.    . Cyanocobalamin (VITAMIN B 12 PO) Take 1 tablet by mouth daily.    . Marland Kitchenexamethasone (DECADRON) 4 MG tablet Take 4 mg twice a day the day before, day of and day after chemo 20 tablet 0  . Diphenhyd-Hydrocort-Nystatin (FIRST-DUKES MOUTHWASH) SUSP Take 5 mLs by mouth QID. 110 mL 6  . dronabinol (MARINOL) 2.5 MG capsule Take 1 capsule (2.5 mg total) by mouth 2 (two) times daily before a  meal. 60 capsule 0  . ferrous sulfate 325 (65 FE) MG tablet Take 325 mg by mouth daily with breakfast.    . folic acid (FOLVITE) 1 MG tablet Take 1 tablet (1 mg total) by mouth daily. 30 tablet 4  . glycopyrrolate (ROBINUL) 2 MG tablet Take 2 mg by mouth 3 (three) times daily as needed.  6  . guaiFENesin (MUCINEX) 600 MG 12 hr tablet Take 1 tablet (600 mg total) by mouth 2 (two) times daily as needed. (Patient taking differently: Take 1,200 mg by mouth 2 (two) times daily as needed. )    . ibuprofen (ADVIL,MOTRIN) 200 MG tablet Take 200 mg by mouth 3 (three) times daily.    . magnesium  30 MG tablet Take 30 mg by mouth daily.    . methylphenidate (RITALIN) 5 MG tablet Take 1 tablet a day. 30 tablet 0  . Multiple Vitamin (MULTI VITAMIN DAILY PO) Take 1 tablet by mouth daily.    . Omega-3 Fatty Acids (FISH OIL) 1000 MG CAPS Take 1,000 mg by mouth daily.    Marland Kitchen oxyCODONE-acetaminophen (PERCOCET/ROXICET) 5-325 MG tablet Take 1 tablet by mouth every 8 (eight) hours as needed for severe pain. 45 tablet 0  . predniSONE (DELTASONE) 10 MG tablet 1 tablet twice a day 60 tablet 0  . prochlorperazine (COMPAZINE) 10 MG tablet Take 1 tablet (10 mg total) by mouth every 6 (six) hours as needed for nausea or vomiting. 30 tablet 0  . saccharomyces boulardii (FLORASTOR) 250 MG capsule Take 250 mg by mouth 2 (two) times daily.    . SYMBICORT 160-4.5 MCG/ACT inhaler INHALE 2 PUFFS TWICE DAILY 10.2 Inhaler 5  . temazepam (RESTORIL) 15 MG capsule Take 1 capsule (15 mg total) by mouth at bedtime as needed for sleep. 30 capsule 0  . vitamin A 10000 UNIT capsule 1 capsule with food or milk     No current facility-administered medications for this visit.     SURGICAL HISTORY:  Past Surgical History:  Procedure Laterality Date  . CHEST TUBE INSERTION Right 08/02/2015   Procedure: INSERTION OF SECOND RIGHT CHEST TUBE WITH FLUROSCOPY;  Surgeon: Grace Isaac, MD;  Location: [facility];  Service: Thoracic;  Laterality: Right;  . CHEST TUBE INSERTION  07/28/2015  . COLONOSCOPY W/ BIOPSIES AND POLYPECTOMY    . HERNIA REPAIR  2011  . INGUINAL HERNIA REPAIR Left 07/15/2015   Procedure: OPEN LEFT INGUINAL HERNIA REPAIR;  Surgeon: Johnathan Hausen, MD;  Location: [facility];  Service: General;  Laterality: Left;  With MESH  . TONSILLECTOMY    . VIDEO BRONCHOSCOPY WITH ENDOBRONCHIAL ULTRASOUND N/A 04/18/2015   Procedure: VIDEO BRONCHOSCOPY WITH ENDOBRONCHIAL ULTRASOUND;  Surgeon: Grace Isaac, MD;  Location: [facility];  Service: Thoracic;  Laterality: N/A;  . VIDEO BRONCHOSCOPY WITH INSERTION OF INTERBRONCHIAL VALVE  (IBV) N/A 08/02/2015   Procedure: VIDEO BRONCHOSCOPY WITH ATTEMPTED INSERTION OF INTERBRONCHIAL VALVE (IBV) WITH FLUROSCOPY;  Surgeon: Grace Isaac, MD;  Location: [facility];  Service: Thoracic;  Laterality: N/A;  . WISDOM TOOTH EXTRACTION      REVIEW OF SYSTEMS:   Review of Systems  Constitutional: Negative for appetite change, chills, fatigue, fever and unexpected weight change.  HENT:   Negative for mouth sores, nosebleeds, sore throat and trouble swallowing.   Eyes: Negative for eye problems and icterus.  Respiratory: Negative for hemoptysis and wheezing.  Positive for cough.  He reports that his breathing is improved with prednisone. Cardiovascular: Negative for chest pain and leg swelling.  Gastrointestinal: Negative for abdominal pain, constipation, diarrhea, nausea and vomiting.  Genitourinary: Negative for bladder incontinence, difficulty urinating, dysuria, frequency and hematuria.   Musculoskeletal: Negative for gait problem, neck pain and neck stiffness.  Intermittent back pain which is controlled with Percocet. Skin: Negative for itching and rash.  Neurological: Negative for dizziness, extremity weakness, gait problem, headaches, light-headedness and seizures.  Hematological: Negative for adenopathy. Does not bruise/bleed easily.  Psychiatric/Behavioral: Negative for confusion, depression and sleep disturbance. The patient is not nervous/anxious.     PHYSICAL EXAMINATION:  Blood pressure (!) 156/86, pulse (!) 101, temperature 97.9 F (36.6 C), temperature source Oral, resp. rate 18, height 5' 10"  (1.778 m), weight 100 lb 11.2 oz (45.7 kg), SpO2 100 %.  ECOG PERFORMANCE STATUS: 1 - Symptomatic but completely ambulatory  Physical Exam  Constitutional: Oriented to person, place, and time. No distress.  HENT:  Head: Normocephalic and atraumatic.  Mouth/Throat: Oropharynx is clear and moist. No oropharyngeal exudate.  Eyes: Conjunctivae are normal. Right eye exhibits no  discharge. Left eye exhibits no discharge. No scleral icterus.  Neck: Normal range of motion. Neck supple.  Cardiovascular: Normal rate, regular rhythm, normal heart sounds and intact distal pulses.   Pulmonary/Chest: Effort normal. No respiratory distress. No rales.  Scattered rhonchi which clear with cough. Abdominal: Soft. Bowel sounds are normal. Exhibits no distension and no mass. There is no tenderness.  Musculoskeletal: Normal range of motion. Exhibits no edema.  Lymphadenopathy:    No cervical adenopathy.  Neurological: Alert and oriented to person, place, and time. Exhibits normal muscle tone. Gait normal. Coordination normal.  Skin: Skin is warm and dry. No rash noted. Not diaphoretic. No erythema. No pallor.  Psychiatric: Mood, memory and judgment normal.  Vitals reviewed.  LABORATORY DATA: Lab Results  Component Value Date   WBC 11.2 (H) 07/16/2018   HGB 10.6 (L) 07/16/2018   HCT 32.2 (L) 07/16/2018   MCV 93.1 07/16/2018   PLT 387 07/16/2018      Chemistry      Component Value Date/Time   NA 139 06/30/2018 1516   NA 139 10/14/2017 1523   K 4.0 06/30/2018 1516   K 3.9 10/14/2017 1523   CL 99 06/30/2018 1516   CO2 28 06/30/2018 1516   CO2 24 10/14/2017 1523   BUN 40 (H) 06/30/2018 1516   BUN 27.2 (H) 10/14/2017 1523   CREATININE 1.09 06/30/2018 1516   CREATININE 0.96 06/25/2018 1338   CREATININE 0.9 10/14/2017 1523      Component Value Date/Time   CALCIUM 9.2 06/30/2018 1516   CALCIUM 9.5 10/14/2017 1523   ALKPHOS 103 06/30/2018 1516   ALKPHOS 111 10/14/2017 1523   AST 24 06/30/2018 1516   AST 18 06/25/2018 1338   AST 21 10/14/2017 1523   ALT 24 06/30/2018 1516   ALT 16 06/25/2018 1338   ALT 20 10/14/2017 1523   BILITOT 0.9 06/30/2018 1516   BILITOT 0.3 06/25/2018 1338   BILITOT 0.26 10/14/2017 1523       RADIOGRAPHIC STUDIES:  Dg Chest 1 View  Result Date: 06/18/2018 CLINICAL DATA:  Currently being treated for non-small lung cancer right lung.  Shortness of breath 2 weeks. EXAM: CHEST  1 VIEW COMPARISON:  03/03/2018 and 11/26/2016 FINDINGS: Patient is rotated to the right. Stable chronic changes over the right lung/thorax. Mild prominence of the left perihilar markings. Cardiac silhouette is within normal. Remainder the exam is unchanged. IMPRESSION: No acute findings. Chronic stable changes of the right lung/hemithorax. Electronically Signed  By: Marin Olp M.D.   On: 06/18/2018 15:49   Ct Chest W Contrast  Result Date: 07/16/2018 CLINICAL DATA:  Lung cancer EXAM: CT CHEST, ABDOMEN, AND PELVIS WITH CONTRAST TECHNIQUE: Multidetector CT imaging of the chest, abdomen and pelvis was performed following the standard protocol during bolus administration of intravenous contrast. CONTRAST:  37m OMNIPAQUE IOHEXOL 300 MG/ML  SOLN COMPARISON:  04/25/2018 FINDINGS: CT CHEST FINDINGS Cardiovascular: The heart size is normal. No substantial pericardial effusion. Coronary artery calcification is evident. Atherosclerotic calcification is noted in the wall of the thoracic aorta. Mediastinum/Nodes: Similar appearance of mild mediastinal lymphadenopathy. No left hilar lymphadenopathy. Right hilum diffusely encased by abnormal soft tissue as before. The esophagus has normal imaging features. There is no axillary lymphadenopathy. Lungs/Pleura: The central tracheobronchial airways are patent. Similar appearance of mass-effect on the right mainstem bronchus and bronchus intermedius. Continued further increase in enhancing pleural nodularity at the right apex. 1.9 x 2.9 cm lesion (24/2) on today's study appears to be in the chest wall and was 1.2 x 2.4 cm previously. Marked volume loss in the right hemithorax evident with consolidation of the right upper lobe and collapse of the right lower lobe, similar to prior. Left lung is hyperexpanded and markedly emphysematous. 14 mm subpleural lesion in the left upper lobe is stable at 14 mm. Area of probable scarring in the  medial left upper lobe is stable. Architectural distortion with subtle ill-defined nodularity posterior left upper lobe (47/4) shows no substantial change. Spiculated left lower lobe nodule is stable to minimally decreased measuring 6 x 6 mm today compared to 6 x 7 mm previously. Areas of architectural distortion/nodularity at the left base are similar although a medial nodule (138/4) measures 8 mm today is new in the interval. This probably represents an area of atypical infection. 4 mm cavitary nodule posterior left lower lobe (136/4) is new in the interval. Musculoskeletal: Lesion in the right T11 vertebral body appears progressive in the interval and abnormal soft tissue in the right paraspinal space at T11-12 is similar with neural foraminal involvement. CT ABDOMEN PELVIS FINDINGS Hepatobiliary: No focal abnormality within the liver parenchyma. There is no evidence for gallstones, gallbladder wall thickening, or pericholecystic fluid. Mild biliary prominence without overt biliary duct dilatation. Pancreas: No focal mass lesion. No dilatation of the main duct. No intraparenchymal cyst. No peripancreatic edema. Spleen: No splenomegaly. No focal mass lesion. Adrenals/Urinary Tract: No adrenal nodule or mass. Similar appearance of differential perfusion right upper renal pole, nonspecific given persistence. Nonobstructing stones are seen in the upper pole the right kidney. Left kidney unremarkable. No evidence for hydroureter. The urinary bladder appears normal for the degree of distention. Stomach/Bowel: Stomach is markedly distended no obstructing mass evident. Duodenum is normally positioned as is the ligament of Treitz. No small bowel wall thickening. No small bowel dilatation. Terminal ileum not well seen. The appendix is not visualized, but there is no edema or inflammation in the region of the cecum. Colon is decompressed. Vascular/Lymphatic: There is abdominal aortic atherosclerosis without aneurysm. There  is no gastrohepatic or hepatoduodenal ligament lymphadenopathy. No intraperitoneal or retroperitoneal lymphadenopathy. No pelvic sidewall lymphadenopathy. Reproductive: Prostate gland is markedly enlarged. Other: Small volume intraperitoneal free fluid wall edema. There is diffuse mesenteric and body Musculoskeletal: Right groin hernia contains bowel loops without complicating features. Scattered bony metastases again noted. Left sacral lesion is more conspicuous today. Similar appearance of the L1 lesion. IMPRESSION: 1. Progression of disease in the right upper hemithorax with more prominent pleural  nodularity and chest wall disease. 2. Stable collapse right lower lobe. 3. Similar appearance multiple left lung nodules with new clustered nodularity posterior left lower lobe, likely related to infection/inflammation although aspiration could have this appearance. 4. Progression of metastatic involvement at T11 with similar appearance of the extraosseous paraspinal disease at T11-12. Remaining bony metastatic involvement is similar. 5. Stomach is markedly distended and fluid-filled. No obstructing lesion evident. 6.  Aortic Atherosclerois (ICD10-170.0) Electronically Signed   By: Misty Stanley M.D.   On: 07/16/2018 09:54   Ct Abdomen Pelvis W Contrast  Result Date: 07/16/2018 CLINICAL DATA:  Lung cancer EXAM: CT CHEST, ABDOMEN, AND PELVIS WITH CONTRAST TECHNIQUE: Multidetector CT imaging of the chest, abdomen and pelvis was performed following the standard protocol during bolus administration of intravenous contrast. CONTRAST:  18m OMNIPAQUE IOHEXOL 300 MG/ML  SOLN COMPARISON:  04/25/2018 FINDINGS: CT CHEST FINDINGS Cardiovascular: The heart size is normal. No substantial pericardial effusion. Coronary artery calcification is evident. Atherosclerotic calcification is noted in the wall of the thoracic aorta. Mediastinum/Nodes: Similar appearance of mild mediastinal lymphadenopathy. No left hilar lymphadenopathy.  Right hilum diffusely encased by abnormal soft tissue as before. The esophagus has normal imaging features. There is no axillary lymphadenopathy. Lungs/Pleura: The central tracheobronchial airways are patent. Similar appearance of mass-effect on the right mainstem bronchus and bronchus intermedius. Continued further increase in enhancing pleural nodularity at the right apex. 1.9 x 2.9 cm lesion (24/2) on today's study appears to be in the chest wall and was 1.2 x 2.4 cm previously. Marked volume loss in the right hemithorax evident with consolidation of the right upper lobe and collapse of the right lower lobe, similar to prior. Left lung is hyperexpanded and markedly emphysematous. 14 mm subpleural lesion in the left upper lobe is stable at 14 mm. Area of probable scarring in the medial left upper lobe is stable. Architectural distortion with subtle ill-defined nodularity posterior left upper lobe (47/4) shows no substantial change. Spiculated left lower lobe nodule is stable to minimally decreased measuring 6 x 6 mm today compared to 6 x 7 mm previously. Areas of architectural distortion/nodularity at the left base are similar although a medial nodule (138/4) measures 8 mm today is new in the interval. This probably represents an area of atypical infection. 4 mm cavitary nodule posterior left lower lobe (136/4) is new in the interval. Musculoskeletal: Lesion in the right T11 vertebral body appears progressive in the interval and abnormal soft tissue in the right paraspinal space at T11-12 is similar with neural foraminal involvement. CT ABDOMEN PELVIS FINDINGS Hepatobiliary: No focal abnormality within the liver parenchyma. There is no evidence for gallstones, gallbladder wall thickening, or pericholecystic fluid. Mild biliary prominence without overt biliary duct dilatation. Pancreas: No focal mass lesion. No dilatation of the main duct. No intraparenchymal cyst. No peripancreatic edema. Spleen: No splenomegaly.  No focal mass lesion. Adrenals/Urinary Tract: No adrenal nodule or mass. Similar appearance of differential perfusion right upper renal pole, nonspecific given persistence. Nonobstructing stones are seen in the upper pole the right kidney. Left kidney unremarkable. No evidence for hydroureter. The urinary bladder appears normal for the degree of distention. Stomach/Bowel: Stomach is markedly distended no obstructing mass evident. Duodenum is normally positioned as is the ligament of Treitz. No small bowel wall thickening. No small bowel dilatation. Terminal ileum not well seen. The appendix is not visualized, but there is no edema or inflammation in the region of the cecum. Colon is decompressed. Vascular/Lymphatic: There is abdominal aortic atherosclerosis without  aneurysm. There is no gastrohepatic or hepatoduodenal ligament lymphadenopathy. No intraperitoneal or retroperitoneal lymphadenopathy. No pelvic sidewall lymphadenopathy. Reproductive: Prostate gland is markedly enlarged. Other: Small volume intraperitoneal free fluid wall edema. There is diffuse mesenteric and body Musculoskeletal: Right groin hernia contains bowel loops without complicating features. Scattered bony metastases again noted. Left sacral lesion is more conspicuous today. Similar appearance of the L1 lesion. IMPRESSION: 1. Progression of disease in the right upper hemithorax with more prominent pleural nodularity and chest wall disease. 2. Stable collapse right lower lobe. 3. Similar appearance multiple left lung nodules with new clustered nodularity posterior left lower lobe, likely related to infection/inflammation although aspiration could have this appearance. 4. Progression of metastatic involvement at T11 with similar appearance of the extraosseous paraspinal disease at T11-12. Remaining bony metastatic involvement is similar. 5. Stomach is markedly distended and fluid-filled. No obstructing lesion evident. 6.  Aortic Atherosclerois  (ICD10-170.0) Electronically Signed   By: Misty Stanley M.D.   On: 07/16/2018 09:54     ASSESSMENT/PLAN:  Adenocarcinoma of right lung, stage 4 (HCC) This is a very pleasant 77year old white male with metastatic non-small cell lung cancer, adenocarcinoma status post short course of concurrent chemoradiation with weekly carboplatin and paclitaxel followed by 1 cycle of consolidation chemotherapy discontinued secondary to intolerance. He was a started on treatment with immunotherapy with Nivolumab 240 mg IV every 2 weeks status post 35 cycles. He tolerated the previous treatment well with no significant adverse effects. His treatment was switched to a Nivolumab 480 mg IV every 4 weeks status post 10 cycles. Hetoleratedthis treatment well except for recent episodes of diarrhea. He was started on high-dose prednisone that was tapered over the last few weeks. He has initial improvement in the diarrhea then he has relapse.He was again started on high-dose prednisone for his persistent diarrhea. 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 He is currently on second line treatment withsingle agent Alimta 500 mg/M2 every 3 weeks.Status post 3 cycles which he is tolerating fairly well. He had a restaging CT scan of the chest, abdomen, pelvis and is here to discuss the results.  The patient was seen with Dr. Julien Nordmann.  Dr. Earlie Server reviewed the radiology report and images with the patient and his family.  We discussed that the radiologist read this as disease progression but in review of the images there does not appear to be a major difference compared to his last scan.  When options were discussed including continuing on Alimta every 3 weeks for 3 more cycles with a restaging CT scan versus obtaining a PET scan and considering a change treatment  to docetaxel and Cyramza. Discussed with the patient he is tolerating his treatment well overall and that we do not see obvious progression of disease on his CT scans.  Recommend for him to continue on Alimta.  The patient is in agreement to this plan.  He will proceed with cycle #4 today as scheduled. He will follow-up in 3 weeks for evaluation prior to cycle #5.  For his dyspnea, he will continue on prednisone 20 mg daily.  For the low back pain,he will continue onPercocet 5/325 mg p.o. every 8 hours as needed for pain.  He was provided a refill today.  He was advised to call immediately if he has any concerning symptoms in the interval. The patient voices understanding of current  disease status and treatment options and is in agreement with the current care plan. All questions were answered. The patient knows to call the clinic with any problems, questions or concerns. We can certainly see the patient much sooner if necessary.   No orders of the defined types were placed in this encounter.    Gabriel Bussing, DNP, AGPCNP-BC, AOCNP 07/16/18   ADDENDUM: Hematology/Oncology Attending: I had a face-to-face encounter with the patient.  I recommended his care plan.  This is a very pleasant 77 years old white male with metastatic non-small cell lung cancer, adenocarcinoma status post several chemotherapy as well as immunotherapy regimen as well as palliative radiation.  The patient is currently undergoing treatment with systemic chemotherapy with single agent Alimta every 3 weeks status post 3 cycles.  He has been tolerating the treatment well and he felt much better after being on continuous dose of prednisone for his COPD and fatigue. He had repeat CT scan of the chest, abdomen and pelvis performed recently.  I personally and independently reviewed the scan images and discussed the results and showed the images to the patient and his family.  His a scan showed some evidence for mild disease  progression especially on the pleural-based nodules but not significant enough to change his treatment at this point. I had a lengthy discussion with the patient and his family about his current condition and treatment options.  I gave him the option of continuous treatment with single agent Alimta and close monitoring of these lesions on the upcoming scans versus switching treatment to a different chemotherapy regiment with docetaxel and Cyramza which may have more adverse effects and his current regimen. The patient and his family would like to continue with few more rounds of Alimta before consideration of changing treatment to a different regimen. He will proceed with cycle #4 today as a schedule. He will come back for follow-up visit in 3 weeks for evaluation before the next cycle of his treatment. I strongly advised the patient to quit smoking again. He was advised to call immediately if he has any concerning symptoms in the interval.  Disclaimer: This note was dictated with voice recognition software. Similar sounding words can inadvertently be transcribed and may be missed upon review. Eilleen Kempf, MD 07/20/18

## 2018-07-16 NOTE — Patient Instructions (Signed)
[facility] Discharge Instructions for Patients Receiving Chemotherapy  Today you received the following chemotherapy agents pemetrexed (Alimta)  To help prevent nausea and vomiting after your treatment, we encourage you to take your nausea medication as directed by your doctor.    If you develop nausea and vomiting that is not controlled by your nausea medication, call the clinic.   BELOW ARE SYMPTOMS THAT SHOULD BE REPORTED IMMEDIATELY:  *FEVER GREATER THAN 100.5 F  *CHILLS WITH OR WITHOUT FEVER  NAUSEA AND VOMITING THAT IS NOT CONTROLLED WITH YOUR NAUSEA MEDICATION  *UNUSUAL SHORTNESS OF BREATH  *UNUSUAL BRUISING OR BLEEDING  TENDERNESS IN MOUTH AND THROAT WITH OR WITHOUT PRESENCE OF ULCERS  *URINARY PROBLEMS  *BOWEL PROBLEMS  UNUSUAL RASH Items with * indicate a potential emergency and should be followed up as soon as possible.  Feel free to call the clinic should you have any questions or concerns. The clinic phone number is [phone].  Please show the [facility] at check-in to the Emergency Department and triage nurse.

## 2018-07-17 ENCOUNTER — Other Ambulatory Visit: Payer: Self-pay | Admitting: Medical Oncology

## 2018-07-17 NOTE — Assessment & Plan Note (Signed)
This is a very pleasant Gabriel old white [name] with metastatic non-small cell lung cancer, adenocarcinoma status post short course of concurrent chemoradiation with weekly carboplatin and paclitaxel followed by 1 cycle of consolidation chemotherapy discontinued secondary to intolerance. He was a started on treatment with immunotherapy with Nivolumab 240 mg IV every 2 weeks status post 35 cycles. He tolerated the previous treatment well with no significant adverse effects. His treatment was switched to a Nivolumab 480 mg IV every 4 weeks status post 10 cycles. Hetoleratedthis treatment well except for recent episodes of diarrhea. He was started on high-dose prednisone that was tapered over the last few weeks. He has initial improvement in the diarrhea then he has relapse.He was again started on high-dose prednisone for his persistent diarrhea. 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 He is currently on second line treatment withsingle agent Alimta 500 mg/M2 every 3 weeks.Status post 3 cycles which he is tolerating fairly well. He had a restaging CT scan of the chest, abdomen, pelvis and is here to discuss the results.  The patient was seen with Dr. Julien Nordmann.  Dr. Earlie Server reviewed the radiology report and images with the patient and his family.  We discussed that the radiologist read this as disease progression but in review of the images there does not appear to be a major difference compared to his last scan.  When options were discussed including continuing on Alimta every 3 weeks for 3 more cycles with a restaging CT scan versus obtaining a PET scan and considering a change treatment to docetaxel and Cyramza. Discussed with the patient he is tolerating his treatment well overall and that we do not see obvious progression of disease on  his CT scans.  Recommend for him to continue on Alimta.  The patient is in agreement to this plan.  He will proceed with cycle #4 today as scheduled. He will follow-up in 3 weeks for evaluation prior to cycle #5.  For his dyspnea, he will continue on prednisone 20 mg daily.  For the low back pain,he will continue onPercocet 5/325 mg p.o. every 8 hours as needed for pain.  He was provided a refill today.  He was advised to call immediately if he has any concerning symptoms in the interval. The patient voices understanding of current disease status and treatment options and is in agreement with the current care plan. All questions were answered. The patient knows to call the clinic with any problems, questions or concerns. We can certainly see the patient much sooner if necessary.

## 2018-07-25 ENCOUNTER — Other Ambulatory Visit: Payer: Self-pay | Admitting: Internal Medicine

## 2018-08-04 ENCOUNTER — Encounter: Payer: Self-pay | Admitting: Internal Medicine

## 2018-08-04 ENCOUNTER — Telehealth: Payer: Self-pay | Admitting: Internal Medicine

## 2018-08-04 ENCOUNTER — Inpatient Hospital Stay: Payer: 59

## 2018-08-04 ENCOUNTER — Inpatient Hospital Stay (HOSPITAL_BASED_OUTPATIENT_CLINIC_OR_DEPARTMENT_OTHER): Payer: 59 | Admitting: Internal Medicine

## 2018-08-04 ENCOUNTER — Inpatient Hospital Stay: Payer: 59 | Attending: Oncology

## 2018-08-04 VITALS — BP 145/77 | HR 102 | Temp 97.6°F | Resp 24 | Ht 70.0 in | Wt 96.8 lb

## 2018-08-04 VITALS — HR 85 | Resp 20

## 2018-08-04 DIAGNOSIS — Z85828 Personal history of other malignant neoplasm of skin: Secondary | ICD-10-CM | POA: Insufficient documentation

## 2018-08-04 DIAGNOSIS — I10 Essential (primary) hypertension: Secondary | ICD-10-CM

## 2018-08-04 DIAGNOSIS — Z79899 Other long term (current) drug therapy: Secondary | ICD-10-CM

## 2018-08-04 DIAGNOSIS — Z923 Personal history of irradiation: Secondary | ICD-10-CM | POA: Insufficient documentation

## 2018-08-04 DIAGNOSIS — F419 Anxiety disorder, unspecified: Secondary | ICD-10-CM

## 2018-08-04 DIAGNOSIS — E46 Unspecified protein-calorie malnutrition: Secondary | ICD-10-CM

## 2018-08-04 DIAGNOSIS — N400 Enlarged prostate without lower urinary tract symptoms: Secondary | ICD-10-CM | POA: Diagnosis not present

## 2018-08-04 DIAGNOSIS — C3411 Malignant neoplasm of upper lobe, right bronchus or lung: Secondary | ICD-10-CM | POA: Diagnosis present

## 2018-08-04 DIAGNOSIS — R590 Localized enlarged lymph nodes: Secondary | ICD-10-CM | POA: Insufficient documentation

## 2018-08-04 DIAGNOSIS — F17210 Nicotine dependence, cigarettes, uncomplicated: Secondary | ICD-10-CM | POA: Diagnosis not present

## 2018-08-04 DIAGNOSIS — R634 Abnormal weight loss: Secondary | ICD-10-CM | POA: Diagnosis not present

## 2018-08-04 DIAGNOSIS — C3491 Malignant neoplasm of unspecified part of right bronchus or lung: Secondary | ICD-10-CM

## 2018-08-04 DIAGNOSIS — I700 Atherosclerosis of aorta: Secondary | ICD-10-CM | POA: Diagnosis not present

## 2018-08-04 DIAGNOSIS — C7951 Secondary malignant neoplasm of bone: Secondary | ICD-10-CM | POA: Insufficient documentation

## 2018-08-04 DIAGNOSIS — Z5111 Encounter for antineoplastic chemotherapy: Secondary | ICD-10-CM | POA: Diagnosis not present

## 2018-08-04 DIAGNOSIS — K4090 Unilateral inguinal hernia, without obstruction or gangrene, not specified as recurrent: Secondary | ICD-10-CM

## 2018-08-04 DIAGNOSIS — E440 Moderate protein-calorie malnutrition: Secondary | ICD-10-CM

## 2018-08-04 DIAGNOSIS — R5383 Other fatigue: Secondary | ICD-10-CM

## 2018-08-04 LAB — CBC WITH DIFFERENTIAL (CANCER CENTER ONLY)
Basophils Absolute: 0 10*3/uL (ref 0.0–0.1)
Basophils Relative: 0 %
Eosinophils Absolute: 0 10*3/uL (ref 0.0–0.5)
Eosinophils Relative: 0 %
HEMATOCRIT: 32.7 % — AB (ref 38.4–49.9)
HEMOGLOBIN: 10.6 g/dL — AB (ref 13.0–17.1)
LYMPHS PCT: 2 %
Lymphs Abs: 0.3 10*3/uL — ABNORMAL LOW (ref 0.9–3.3)
MCH: 31.9 pg (ref 27.2–33.4)
MCHC: 32.4 g/dL (ref 32.0–36.0)
MCV: 98.5 fL — ABNORMAL HIGH (ref 79.3–98.0)
Monocytes Absolute: 0.3 10*3/uL (ref 0.1–0.9)
Monocytes Relative: 2 %
NEUTROS ABS: 13.7 10*3/uL — AB (ref 1.5–6.5)
NEUTROS PCT: 96 %
Platelet Count: 311 10*3/uL (ref 140–400)
RBC: 3.32 MIL/uL — AB (ref 4.20–5.82)
RDW: 21.5 % — ABNORMAL HIGH (ref 11.0–14.6)
WBC: 14.2 10*3/uL — AB (ref 4.0–10.3)

## 2018-08-04 LAB — CMP (CANCER CENTER ONLY)
ALK PHOS: 93 U/L (ref 38–126)
ALT: 11 U/L (ref 0–44)
AST: 14 U/L — ABNORMAL LOW (ref 15–41)
Albumin: 3.4 g/dL — ABNORMAL LOW (ref 3.5–5.0)
Anion gap: 10 (ref 5–15)
BILIRUBIN TOTAL: 0.4 mg/dL (ref 0.3–1.2)
BUN: 23 mg/dL (ref 8–23)
CO2: 26 mmol/L (ref 22–32)
CREATININE: 0.95 mg/dL (ref 0.61–1.24)
Calcium: 9.4 mg/dL (ref 8.9–10.3)
Chloride: 101 mmol/L (ref 98–111)
GFR, Estimated: >60 mL/min (ref >60)
GLUCOSE: 138 mg/dL — AB (ref 70–99)
Potassium: 3.8 mmol/L (ref 3.5–5.1)
Sodium: 137 mmol/L (ref 135–145)
TOTAL PROTEIN: 6.8 g/dL (ref 6.5–8.1)

## 2018-08-04 MED ORDER — SODIUM CHLORIDE 0.9 % IV SOLN
Freq: Once | INTRAVENOUS | Status: AC
  Administered 2018-08-04: 16:00:00 via INTRAVENOUS
  Filled 2018-08-04: qty 250

## 2018-08-04 MED ORDER — SODIUM CHLORIDE 0.9 % IV SOLN
525.0000 mg/m2 | Freq: Once | INTRAVENOUS | Status: AC
  Administered 2018-08-04: 800 mg via INTRAVENOUS
  Filled 2018-08-04: qty 20

## 2018-08-04 MED ORDER — ALPRAZOLAM 0.25 MG PO TABS: 0.2500 mg | ORAL_TABLET | Freq: Every day | ORAL | 0 refills | Status: DC

## 2018-08-04 MED ORDER — DEXAMETHASONE 4 MG PO TABS: ORAL_TABLET | ORAL | 0 refills | Status: AC

## 2018-08-04 MED ORDER — PROCHLORPERAZINE MALEATE 10 MG PO TABS
10.0000 mg | ORAL_TABLET | Freq: Once | ORAL | Status: AC
  Administered 2018-08-04: 10 mg via ORAL

## 2018-08-04 MED ORDER — PROCHLORPERAZINE MALEATE 10 MG PO TABS
ORAL_TABLET | ORAL | Status: AC
  Filled 2018-08-04: qty 1

## 2018-08-04 NOTE — Telephone Encounter (Signed)
Appts scheduled avs/calendar printed per 10/7 los

## 2018-08-04 NOTE — Patient Instructions (Signed)
[facility] Discharge Instructions for Patients Receiving Chemotherapy  Today you received the following chemotherapy agents: pemetrexed (Alimta).  To help prevent nausea and vomiting after your treatment, we encourage you to take your nausea medication as directed by your doctor.    If you develop nausea and vomiting that is not controlled by your nausea medication, call the clinic.   BELOW ARE SYMPTOMS THAT SHOULD BE REPORTED IMMEDIATELY:  *FEVER GREATER THAN 100.5 F  *CHILLS WITH OR WITHOUT FEVER  NAUSEA AND VOMITING THAT IS NOT CONTROLLED WITH YOUR NAUSEA MEDICATION  *UNUSUAL SHORTNESS OF BREATH  *UNUSUAL BRUISING OR BLEEDING  TENDERNESS IN MOUTH AND THROAT WITH OR WITHOUT PRESENCE OF ULCERS  *URINARY PROBLEMS  *BOWEL PROBLEMS  UNUSUAL RASH Items with * indicate a potential emergency and should be followed up as soon as possible.  Feel free to call the clinic should you have any questions or concerns. The clinic phone number is [phone].  Please show the [facility] at check-in to the Emergency Department and triage nurse.

## 2018-08-04 NOTE — Progress Notes (Signed)
[facility] [phone]   [phone]  OFFICE PROGRESS NOTE  Gabriel Dials, MD [place] [address]  DIAGNOSIS: Metastatic non-small cell lung cancer, adenocarcinoma initially diagnosed as unresectable stage IIIa (T2a, N2, M0) in July 2016.  Foundation One molecular studies reveal that he was positive for [place], [place], and STAG2 [id number]. He was negative for: EGFR, ALK, BRAF, MET, RET, ERBB2 and ROS1  PRIOR THERAPY: 1) Concurrent chemoradiation with chemotherapy in the form of weekly carboplatin for AUC 2 paclitaxel 45 mg/m given concurrent with radiation therapy. First cycle started 05/09/2015. Status post 7 cycles. 2) Consolidation chemotherapy with carboplatin for AUC of 5 on day 1 and gemcitabine 1000 MG/M2 on days 1 and 8 every 3 weeks. First dose 08/15/2015. Discontinued secondary to intolerance. 3)  Immunotherapy with Nivolumab 240 mg IV every 2 weeks, status post 35 cycles. 4) Immunotherapy with Nivolumab 480 mg IV every 4 weeks status post 10 cycles.  CURRENT THERAPY: Systemic chemotherapy with Alimta 500 mg/M2 every 3 weeks.  First dose May 14, 2018.  Status post 4 cycles.  INTERVAL HISTORY: Gabriel HOFFERBER77y.o. male returns to the clinic today for follow-up visit.  The patient is feeling fine today with no concerning complaints except for the persistent weight loss and fatigue with minimal exertion.  He denied having any chest pain but has shortness of breath with exertion with mild cough and no hemoptysis.  He denied having any fever or chills.  He has no nausea, vomiting, diarrhea or constipation.  He continues to tolerate his treatment with Alimta fairly well.  MEDICAL HISTORY: Past Medical History:  Diagnosis Date  . Cancer ([facility]    skin cancer  . Constipation    AFTER ANESTHESIA  . Difficulty sleeping   . History of nonmelanoma skin cancer   . Hypertension 10/30/2016  . Inguinal hernia    . Lung cancer ([facility]   . Lung mass    right / HAS HAD RADIATION AND CHEMO FOR LUNG CANCER  . Pneumothorax on right 08/24/2015  . Productive cough    "DUE TO RADIATION"  . Radiation 05/11/15-06/21/15   NSCLCA/right lung  . Smoking 1/2 pack a day or less 09/17/2016  . Spontaneous pneumothorax MANY YRS AGO AND AGAIN 04/04/15   HISTORY OF (ONLY)  . Weight loss 04/01/2017    ALLERGIES:  is allergic to glycopyrrolate and aleve [naproxen].  MEDICATIONS:  Current Outpatient Medications  Medication Sig Dispense Refill  . albuterol (PROVENTIL HFA) 108 (90 Base) MCG/ACT inhaler Inhale 2 puffs into the lungs every 6 (six) hours as needed for wheezing or shortness of breath.    . Arginine (RA L-ARGININE) 1000 MG TABS Take 500 mg by mouth daily.     . Ascorbic Acid (VITAMIN C) 1000 MG tablet Take 1,000 mg by mouth daily. Pt takes 2 5077mtabs    . B Complex Vitamins (B COMPLEX PO) Take 1 capsule by mouth daily.     . Marland KitchenALCIUM CITRATE PO Take 1 tablet by mouth daily.    . Cyanocobalamin (VITAMIN B 12 PO) Take 1 tablet by mouth daily.    . Marland Kitchenexamethasone (DECADRON) 4 MG tablet Take 4 mg twice a day the day before, day of and day after chemo (Patient not taking: Reported on 07/16/2018) 20 tablet 0  . Diphenhyd-Hydrocort-Nystatin (FIRST-DUKES MOUTHWASH) SUSP Take 5 mLs by mouth QID. (Patient not taking: Reported on 07/16/2018) 110 mL 6  .  dronabinol (MARINOL) 2.5 MG capsule Take 1 capsule (2.5 mg total) by mouth 2 (two) times daily before a meal. (Patient not taking: Reported on 07/16/2018) 60 capsule 0  . ferrous sulfate 325 (65 FE) MG tablet Take 325 mg by mouth daily with breakfast.    . folic acid (FOLVITE) 1 MG tablet Take 1 tablet (1 mg total) by mouth daily. (Patient not taking: Reported on 07/16/2018) 30 tablet 4  . guaiFENesin (MUCINEX) 600 MG 12 hr tablet Take 1 tablet (600 mg total) by mouth 2 (two) times daily as needed. (Patient taking differently: Take 1,200 mg by mouth 2 (two) times daily as needed.  )    . ibuprofen (ADVIL,MOTRIN) 200 MG tablet Take 200 mg by mouth 3 (three) times daily.    . magnesium 30 MG tablet Take 30 mg by mouth daily.    . methylphenidate (RITALIN) 5 MG tablet Take 1 tablet a day. (Patient not taking: Reported on 07/16/2018) 30 tablet 0  . Multiple Vitamin (MULTI VITAMIN DAILY PO) Take 1 tablet by mouth daily.    . Omega-3 Fatty Acids (FISH OIL) 1000 MG CAPS Take 1,000 mg by mouth daily.    . ondansetron (ZOFRAN) 8 MG tablet Take 1 tablet (8 mg total) by mouth every 8 (eight) hours as needed for nausea or vomiting. 20 tablet 1  . oxyCODONE-acetaminophen (PERCOCET/ROXICET) 5-325 MG tablet Take 1 tablet by mouth every 8 (eight) hours as needed for severe pain. 45 tablet 0  . predniSONE (DELTASONE) 10 MG tablet TAKE 1 TABLET BY MOUTH TWICE A DAY 60 tablet 0  . prochlorperazine (COMPAZINE) 10 MG tablet Take 1 tablet (10 mg total) by mouth every 6 (six) hours as needed for nausea or vomiting. (Patient not taking: Reported on 07/16/2018) 30 tablet 0  . saccharomyces boulardii (FLORASTOR) 250 MG capsule Take 250 mg by mouth 2 (two) times daily.    . SYMBICORT 160-4.5 MCG/ACT inhaler INHALE 2 PUFFS TWICE DAILY (Patient not taking: Reported on 07/16/2018) 10.2 Inhaler 5  . temazepam (RESTORIL) 15 MG capsule Take 1 capsule (15 mg total) by mouth at bedtime as needed for sleep. (Patient not taking: Reported on 07/16/2018) 30 capsule 0  . vitamin A 10000 UNIT capsule 1 capsule with food or milk     No current facility-administered medications for this visit.     SURGICAL HISTORY:  Past Surgical History:  Procedure Laterality Date  . CHEST TUBE INSERTION Right 08/02/2015   Procedure: INSERTION OF SECOND RIGHT CHEST TUBE WITH FLUROSCOPY;  Surgeon: Grace Isaac, MD;  Location: [facility];  Service: Thoracic;  Laterality: Right;  . CHEST TUBE INSERTION  07/28/2015  . COLONOSCOPY W/ BIOPSIES AND POLYPECTOMY    . HERNIA REPAIR  2011  . INGUINAL HERNIA REPAIR Left 07/15/2015    Procedure: OPEN LEFT INGUINAL HERNIA REPAIR;  Surgeon: Johnathan Hausen, MD;  Location: [facility];  Service: General;  Laterality: Left;  With MESH  . TONSILLECTOMY    . VIDEO BRONCHOSCOPY WITH ENDOBRONCHIAL ULTRASOUND N/A 04/18/2015   Procedure: VIDEO BRONCHOSCOPY WITH ENDOBRONCHIAL ULTRASOUND;  Surgeon: Grace Isaac, MD;  Location: [facility];  Service: Thoracic;  Laterality: N/A;  . VIDEO BRONCHOSCOPY WITH INSERTION OF INTERBRONCHIAL VALVE (IBV) N/A 08/02/2015   Procedure: VIDEO BRONCHOSCOPY WITH ATTEMPTED INSERTION OF INTERBRONCHIAL VALVE (IBV) WITH FLUROSCOPY;  Surgeon: Grace Isaac, MD;  Location: [facility];  Service: Thoracic;  Laterality: N/A;  . WISDOM TOOTH EXTRACTION      REVIEW OF SYSTEMS:  A comprehensive review  of systems was negative except for: Constitutional: positive for fatigue and weight loss Respiratory: positive for cough and dyspnea on exertion   PHYSICAL EXAMINATION: General appearance: alert, cooperative, fatigued and no distress Head: Normocephalic, without obvious abnormality, atraumatic Neck: no adenopathy, no JVD, supple, symmetrical, trachea midline and thyroid not enlarged, symmetric, no tenderness/mass/nodules Lymph nodes: Cervical, supraclavicular, and axillary nodes normal. Resp: clear to auscultation bilaterally Back: symmetric, no curvature. ROM normal. No CVA tenderness. Cardio: regular rate and rhythm, S1, S2 normal, no murmur, click, rub or gallop GI: soft, non-tender; bowel sounds normal; no masses,  no organomegaly Extremities: extremities normal, atraumatic, no cyanosis or edema  ECOG PERFORMANCE STATUS: 1 - Symptomatic but completely ambulatory  Blood pressure (!) 145/77, pulse (!) 102, temperature 97.6 F (36.4 C), temperature source Oral, resp. rate (!) 24, height 5' 10"  (1.778 m), weight 96 lb 12.8 oz (43.9 kg), SpO2 99 %.  LABORATORY DATA: Lab Results  Component Value Date   WBC 11.2 (H) 07/16/2018   HGB 10.6 (L) 07/16/2018   HCT 32.2 (L)  07/16/2018   MCV 93.1 07/16/2018   PLT 387 07/16/2018      Chemistry      Component Value Date/Time   NA 145 07/16/2018 1438   NA 139 10/14/2017 1523   K 3.7 07/16/2018 1438   K 3.9 10/14/2017 1523   CL 106 07/16/2018 1438   CO2 28 07/16/2018 1438   CO2 24 10/14/2017 1523   BUN 23 07/16/2018 1438   BUN 27.2 (H) 10/14/2017 1523   CREATININE 0.93 07/16/2018 1438   CREATININE 0.9 10/14/2017 1523      Component Value Date/Time   CALCIUM 9.2 07/16/2018 1438   CALCIUM 9.5 10/14/2017 1523   ALKPHOS 95 07/16/2018 1438   ALKPHOS 111 10/14/2017 1523   AST 13 (L) 07/16/2018 1438   AST 21 10/14/2017 1523   ALT 12 07/16/2018 1438   ALT 20 10/14/2017 1523   BILITOT 0.3 07/16/2018 1438   BILITOT 0.26 10/14/2017 1523       RADIOGRAPHIC STUDIES: Ct Chest W Contrast  Result Date: 07/16/2018 CLINICAL DATA:  Lung cancer EXAM: CT CHEST, ABDOMEN, AND PELVIS WITH CONTRAST TECHNIQUE: Multidetector CT imaging of the chest, abdomen and pelvis was performed following the standard protocol during bolus administration of intravenous contrast. CONTRAST:  41m OMNIPAQUE IOHEXOL 300 MG/ML  SOLN COMPARISON:  04/25/2018 FINDINGS: CT CHEST FINDINGS Cardiovascular: The heart size is normal. No substantial pericardial effusion. Coronary artery calcification is evident. Atherosclerotic calcification is noted in the wall of the thoracic aorta. Mediastinum/Nodes: Similar appearance of mild mediastinal lymphadenopathy. No left hilar lymphadenopathy. Right hilum diffusely encased by abnormal soft tissue as before. The esophagus has normal imaging features. There is no axillary lymphadenopathy. Lungs/Pleura: The central tracheobronchial airways are patent. Similar appearance of mass-effect on the right mainstem bronchus and bronchus intermedius. Continued further increase in enhancing pleural nodularity at the right apex. 1.9 x 2.9 cm lesion (24/2) on today's study appears to be in the chest wall and was 1.2 x 2.4 cm  previously. Marked volume loss in the right hemithorax evident with consolidation of the right upper lobe and collapse of the right lower lobe, similar to prior. Left lung is hyperexpanded and markedly emphysematous. 14 mm subpleural lesion in the left upper lobe is stable at 14 mm. Area of probable scarring in the medial left upper lobe is stable. Architectural distortion with subtle ill-defined nodularity posterior left upper lobe (47/4) shows no substantial change. Spiculated left lower lobe nodule  is stable to minimally decreased measuring 6 x 6 mm today compared to 6 x 7 mm previously. Areas of architectural distortion/nodularity at the left base are similar although a medial nodule (138/4) measures 8 mm today is new in the interval. This probably represents an area of atypical infection. 4 mm cavitary nodule posterior left lower lobe (136/4) is new in the interval. Musculoskeletal: Lesion in the right T11 vertebral body appears progressive in the interval and abnormal soft tissue in the right paraspinal space at T11-12 is similar with neural foraminal involvement. CT ABDOMEN PELVIS FINDINGS Hepatobiliary: No focal abnormality within the liver parenchyma. There is no evidence for gallstones, gallbladder wall thickening, or pericholecystic fluid. Mild biliary prominence without overt biliary duct dilatation. Pancreas: No focal mass lesion. No dilatation of the main duct. No intraparenchymal cyst. No peripancreatic edema. Spleen: No splenomegaly. No focal mass lesion. Adrenals/Urinary Tract: No adrenal nodule or mass. Similar appearance of differential perfusion right upper renal pole, nonspecific given persistence. Nonobstructing stones are seen in the upper pole the right kidney. Left kidney unremarkable. No evidence for hydroureter. The urinary bladder appears normal for the degree of distention. Stomach/Bowel: Stomach is markedly distended no obstructing mass evident. Duodenum is normally positioned as is the  ligament of Treitz. No small bowel wall thickening. No small bowel dilatation. Terminal ileum not well seen. The appendix is not visualized, but there is no edema or inflammation in the region of the cecum. Colon is decompressed. Vascular/Lymphatic: There is abdominal aortic atherosclerosis without aneurysm. There is no gastrohepatic or hepatoduodenal ligament lymphadenopathy. No intraperitoneal or retroperitoneal lymphadenopathy. No pelvic sidewall lymphadenopathy. Reproductive: Prostate gland is markedly enlarged. Other: Small volume intraperitoneal free fluid wall edema. There is diffuse mesenteric and body Musculoskeletal: Right groin hernia contains bowel loops without complicating features. Scattered bony metastases again noted. Left sacral lesion is more conspicuous today. Similar appearance of the L1 lesion. IMPRESSION: 1. Progression of disease in the right upper hemithorax with more prominent pleural nodularity and chest wall disease. 2. Stable collapse right lower lobe. 3. Similar appearance multiple left lung nodules with new clustered nodularity posterior left lower lobe, likely related to infection/inflammation although aspiration could have this appearance. 4. Progression of metastatic involvement at T11 with similar appearance of the extraosseous paraspinal disease at T11-12. Remaining bony metastatic involvement is similar. 5. Stomach is markedly distended and fluid-filled. No obstructing lesion evident. 6.  Aortic Atherosclerois (ICD10-170.0) Electronically Signed   By: Misty Stanley M.D.   On: 07/16/2018 09:54   Ct Abdomen Pelvis W Contrast  Result Date: 07/16/2018 CLINICAL DATA:  Lung cancer EXAM: CT CHEST, ABDOMEN, AND PELVIS WITH CONTRAST TECHNIQUE: Multidetector CT imaging of the chest, abdomen and pelvis was performed following the standard protocol during bolus administration of intravenous contrast. CONTRAST:  58m OMNIPAQUE IOHEXOL 300 MG/ML  SOLN COMPARISON:  04/25/2018 FINDINGS: CT  CHEST FINDINGS Cardiovascular: The heart size is normal. No substantial pericardial effusion. Coronary artery calcification is evident. Atherosclerotic calcification is noted in the wall of the thoracic aorta. Mediastinum/Nodes: Similar appearance of mild mediastinal lymphadenopathy. No left hilar lymphadenopathy. Right hilum diffusely encased by abnormal soft tissue as before. The esophagus has normal imaging features. There is no axillary lymphadenopathy. Lungs/Pleura: The central tracheobronchial airways are patent. Similar appearance of mass-effect on the right mainstem bronchus and bronchus intermedius. Continued further increase in enhancing pleural nodularity at the right apex. 1.9 x 2.9 cm lesion (24/2) on today's study appears to be in the chest wall and was 1.2 x 2.4  cm previously. Marked volume loss in the right hemithorax evident with consolidation of the right upper lobe and collapse of the right lower lobe, similar to prior. Left lung is hyperexpanded and markedly emphysematous. 14 mm subpleural lesion in the left upper lobe is stable at 14 mm. Area of probable scarring in the medial left upper lobe is stable. Architectural distortion with subtle ill-defined nodularity posterior left upper lobe (47/4) shows no substantial change. Spiculated left lower lobe nodule is stable to minimally decreased measuring 6 x 6 mm today compared to 6 x 7 mm previously. Areas of architectural distortion/nodularity at the left base are similar although a medial nodule (138/4) measures 8 mm today is new in the interval. This probably represents an area of atypical infection. 4 mm cavitary nodule posterior left lower lobe (136/4) is new in the interval. Musculoskeletal: Lesion in the right T11 vertebral body appears progressive in the interval and abnormal soft tissue in the right paraspinal space at T11-12 is similar with neural foraminal involvement. CT ABDOMEN PELVIS FINDINGS Hepatobiliary: No focal abnormality within  the liver parenchyma. There is no evidence for gallstones, gallbladder wall thickening, or pericholecystic fluid. Mild biliary prominence without overt biliary duct dilatation. Pancreas: No focal mass lesion. No dilatation of the main duct. No intraparenchymal cyst. No peripancreatic edema. Spleen: No splenomegaly. No focal mass lesion. Adrenals/Urinary Tract: No adrenal nodule or mass. Similar appearance of differential perfusion right upper renal pole, nonspecific given persistence. Nonobstructing stones are seen in the upper pole the right kidney. Left kidney unremarkable. No evidence for hydroureter. The urinary bladder appears normal for the degree of distention. Stomach/Bowel: Stomach is markedly distended no obstructing mass evident. Duodenum is normally positioned as is the ligament of Treitz. No small bowel wall thickening. No small bowel dilatation. Terminal ileum not well seen. The appendix is not visualized, but there is no edema or inflammation in the region of the cecum. Colon is decompressed. Vascular/Lymphatic: There is abdominal aortic atherosclerosis without aneurysm. There is no gastrohepatic or hepatoduodenal ligament lymphadenopathy. No intraperitoneal or retroperitoneal lymphadenopathy. No pelvic sidewall lymphadenopathy. Reproductive: Prostate gland is markedly enlarged. Other: Small volume intraperitoneal free fluid wall edema. There is diffuse mesenteric and body Musculoskeletal: Right groin hernia contains bowel loops without complicating features. Scattered bony metastases again noted. Left sacral lesion is more conspicuous today. Similar appearance of the L1 lesion. IMPRESSION: 1. Progression of disease in the right upper hemithorax with more prominent pleural nodularity and chest wall disease. 2. Stable collapse right lower lobe. 3. Similar appearance multiple left lung nodules with new clustered nodularity posterior left lower lobe, likely related to infection/inflammation although  aspiration could have this appearance. 4. Progression of metastatic involvement at T11 with similar appearance of the extraosseous paraspinal disease at T11-12. Remaining bony metastatic involvement is similar. 5. Stomach is markedly distended and fluid-filled. No obstructing lesion evident. 6.  Aortic Atherosclerois (ICD10-170.0) Electronically Signed   By: Misty Stanley M.D.   On: 07/16/2018 09:54    ASSESSMENT AND PLAN: This is a very pleasant 77 years old white male with metastatic non-small cell lung cancer, adenocarcinoma status post short course of concurrent chemoradiation with weekly carboplatin and paclitaxel followed by 1 cycle of consolidation chemotherapy discontinued secondary to intolerance. He was a started on treatment with immunotherapy with Nivolumab 240 mg IV every 2 weeks status post 35 cycles.  He tolerated the previous treatment well with no significant adverse effects. His treatment was switched to a Nivolumab 480 mg IV every 4 weeks  status post 10 cycles.   This treatment was discontinued secondary to disease progression. The patient was a started on systemic chemotherapy with single agent Alimta 500 mg/M2 every 3 weeks status post 4 cycles.  He has been tolerating this treatment well except for fatigue. I recommended for him to proceed with cycle #5 today as scheduled. For the weight loss and malnutrition, the patient was advised to continue on Marinol and to increase his p.o. intake.  He is also currently on prednisone 20 mg p.o. daily. For anxiety I give him a refill of Xanax 0.25 mg p.o. Nightly. He will come back for follow-up visit in 3 weeks for evaluation before the next cycle of his treatment. He was advised to call immediately if he has any concerning symptoms in the interval. The patient voices understanding of current disease status and treatment options and is in agreement with the current care plan. All questions were answered. The patient knows to call the  clinic with any problems, questions or concerns. We can certainly see the patient much sooner if necessary.  Disclaimer: This note was dictated with voice recognition software. Similar sounding words can inadvertently be transcribed and may not be corrected upon review.

## 2018-08-08 ENCOUNTER — Telehealth: Payer: Self-pay | Admitting: Internal Medicine

## 2018-08-08 MED ORDER — FIRST-DUKES MOUTHWASH MT SUSP: 5.0000 mL | Freq: Four times a day (QID) | OROMUCOSAL | 6 refills | Status: AC

## 2018-08-08 NOTE — Telephone Encounter (Signed)
Sure 6 refills of what he has already If not,  # Oral thrush - For Oral thrush: Take Suspension (swish and swallow): 500,000 units 4 times/day for 5 days; swish in the mouth and retain for as long as possible (several minutes) before swallowing. IF nystatin is in back order: alternatives are  A) clotrimazole troche (throt lozenge) 10mg  dissolved and swish and swallow 5 times a day for 14 days. Or B

## 2018-08-08 NOTE — Telephone Encounter (Signed)
Called and spoke with pt letting him know we were going to refill his magic mouthwash Rx.  Pt expressed understanding. Verified pt's preferred pharmacy and sent Rx in. Nothing further needed.

## 2018-08-08 NOTE — Telephone Encounter (Signed)
Called and spoke with Patient.  Patient is requesting a refill of magic mouthwash.  He stated that he is still experiencing thrush off and on.    Dr. Chase Caller, please advise

## 2018-08-12 ENCOUNTER — Other Ambulatory Visit: Payer: Self-pay | Admitting: Medical Oncology

## 2018-08-12 ENCOUNTER — Encounter (HOSPITAL_COMMUNITY): Payer: Self-pay

## 2018-08-12 ENCOUNTER — Telehealth: Payer: Self-pay | Admitting: Medical Oncology

## 2018-08-12 ENCOUNTER — Inpatient Hospital Stay (HOSPITAL_COMMUNITY)
Admission: EM | Admit: 2018-08-12 | Discharge: 2018-08-16 | DRG: 377 | Disposition: A | Discharge status: Home Health | Payer: 59 | Attending: Internal Medicine | Admitting: Internal Medicine

## 2018-08-12 ENCOUNTER — Emergency Department (HOSPITAL_COMMUNITY): Payer: 59

## 2018-08-12 ENCOUNTER — Other Ambulatory Visit: Payer: Self-pay

## 2018-08-12 DIAGNOSIS — K209 Esophagitis, unspecified: Secondary | ICD-10-CM | POA: Diagnosis present

## 2018-08-12 DIAGNOSIS — D61810 Antineoplastic chemotherapy induced pancytopenia: Secondary | ICD-10-CM | POA: Diagnosis present

## 2018-08-12 DIAGNOSIS — L89153 Pressure ulcer of sacral region, stage 3: Secondary | ICD-10-CM | POA: Diagnosis present

## 2018-08-12 DIAGNOSIS — I959 Hypotension, unspecified: Secondary | ICD-10-CM | POA: Diagnosis present

## 2018-08-12 DIAGNOSIS — I10 Essential (primary) hypertension: Secondary | ICD-10-CM | POA: Diagnosis present

## 2018-08-12 DIAGNOSIS — C3491 Malignant neoplasm of unspecified part of right bronchus or lung: Secondary | ICD-10-CM

## 2018-08-12 DIAGNOSIS — T39395A Adverse effect of other nonsteroidal anti-inflammatory drugs [NSAID], initial encounter: Secondary | ICD-10-CM | POA: Diagnosis present

## 2018-08-12 DIAGNOSIS — E43 Unspecified severe protein-calorie malnutrition: Secondary | ICD-10-CM | POA: Diagnosis not present

## 2018-08-12 DIAGNOSIS — Z923 Personal history of irradiation: Secondary | ICD-10-CM | POA: Diagnosis not present

## 2018-08-12 DIAGNOSIS — Z85828 Personal history of other malignant neoplasm of skin: Secondary | ICD-10-CM | POA: Diagnosis not present

## 2018-08-12 DIAGNOSIS — T451X5A Adverse effect of antineoplastic and immunosuppressive drugs, initial encounter: Secondary | ICD-10-CM | POA: Diagnosis present

## 2018-08-12 DIAGNOSIS — D62 Acute posthemorrhagic anemia: Secondary | ICD-10-CM | POA: Diagnosis present

## 2018-08-12 DIAGNOSIS — Z7951 Long term (current) use of inhaled steroids: Secondary | ICD-10-CM | POA: Diagnosis not present

## 2018-08-12 DIAGNOSIS — Z7952 Long term (current) use of systemic steroids: Secondary | ICD-10-CM

## 2018-08-12 DIAGNOSIS — Z79899 Other long term (current) drug therapy: Secondary | ICD-10-CM

## 2018-08-12 DIAGNOSIS — K2971 Gastritis, unspecified, with bleeding: Secondary | ICD-10-CM | POA: Diagnosis present

## 2018-08-12 DIAGNOSIS — K264 Chronic or unspecified duodenal ulcer with hemorrhage: Principal | ICD-10-CM | POA: Diagnosis present

## 2018-08-12 DIAGNOSIS — K449 Diaphragmatic hernia without obstruction or gangrene: Secondary | ICD-10-CM | POA: Diagnosis present

## 2018-08-12 DIAGNOSIS — Z8249 Family history of ischemic heart disease and other diseases of the circulatory system: Secondary | ICD-10-CM | POA: Diagnosis not present

## 2018-08-12 DIAGNOSIS — K222 Esophageal obstruction: Secondary | ICD-10-CM | POA: Diagnosis present

## 2018-08-12 DIAGNOSIS — L98429 Non-pressure chronic ulcer of back with unspecified severity: Secondary | ICD-10-CM | POA: Diagnosis not present

## 2018-08-12 DIAGNOSIS — E46 Unspecified protein-calorie malnutrition: Secondary | ICD-10-CM | POA: Diagnosis present

## 2018-08-12 DIAGNOSIS — J9621 Acute and chronic respiratory failure with hypoxia: Secondary | ICD-10-CM | POA: Diagnosis present

## 2018-08-12 DIAGNOSIS — R5383 Other fatigue: Secondary | ICD-10-CM | POA: Diagnosis present

## 2018-08-12 DIAGNOSIS — R6510 Systemic inflammatory response syndrome (SIRS) of non-infectious origin without acute organ dysfunction: Secondary | ICD-10-CM | POA: Diagnosis present

## 2018-08-12 DIAGNOSIS — J449 Chronic obstructive pulmonary disease, unspecified: Secondary | ICD-10-CM | POA: Diagnosis present

## 2018-08-12 DIAGNOSIS — L8990 Pressure ulcer of unspecified site, unspecified stage: Secondary | ICD-10-CM | POA: Diagnosis present

## 2018-08-12 DIAGNOSIS — K269 Duodenal ulcer, unspecified as acute or chronic, without hemorrhage or perforation: Secondary | ICD-10-CM | POA: Diagnosis present

## 2018-08-12 DIAGNOSIS — D649 Anemia, unspecified: Secondary | ICD-10-CM | POA: Diagnosis not present

## 2018-08-12 DIAGNOSIS — Z87891 Personal history of nicotine dependence: Secondary | ICD-10-CM

## 2018-08-12 DIAGNOSIS — K921 Melena: Secondary | ICD-10-CM | POA: Diagnosis present

## 2018-08-12 LAB — CBC
HCT: 20.1 % — ABNORMAL LOW (ref 39.0–52.0)
HEMATOCRIT: 17.4 % — AB (ref 39.0–52.0)
Hemoglobin: 6.1 g/dL — CL (ref 13.0–17.0)
Hemoglobin: 5.1 g/dL — CL (ref 13.0–17.0)
MCH: 31.4 pg (ref 26.0–34.0)
MCH: 31.1 pg (ref 26.0–34.0)
MCHC: 30.3 g/dL (ref 30.0–36.0)
MCHC: 29.3 g/dL — AB (ref 30.0–36.0)
MCV: 103.6 fL — ABNORMAL HIGH (ref 80.0–100.0)
MCV: 106.1 fL — ABNORMAL HIGH (ref 80.0–100.0)
NRBC: 0 % (ref 0.0–0.2)
PLATELETS: 113 10*3/uL — AB (ref 150–400)
Platelets: 131 K/uL — ABNORMAL LOW (ref 150–400)
RBC: 1.94 MIL/uL — ABNORMAL LOW (ref 4.22–5.81)
RBC: 1.64 MIL/uL — ABNORMAL LOW (ref 4.22–5.81)
RDW: 20.7 % — ABNORMAL HIGH (ref 11.5–15.5)
RDW: 20.6 % — AB (ref 11.5–15.5)
WBC: 2.1 K/uL — ABNORMAL LOW (ref 4.0–10.5)
WBC: 2.4 10*3/uL — AB (ref 4.0–10.5)
nRBC: 0 % (ref 0.0–0.2)

## 2018-08-12 LAB — I-STAT CHEM 8, ED
BUN: 68 mg/dL — ABNORMAL HIGH (ref 8–23)
CHLORIDE: 111 mmol/L (ref 98–111)
Calcium, Ion: 1.13 mmol/L — ABNORMAL LOW (ref 1.15–1.40)
Creatinine, Ser: 0.9 mg/dL (ref 0.61–1.24)
Glucose, Bld: 108 mg/dL — ABNORMAL HIGH (ref 70–99)
HEMATOCRIT: 15 % — AB (ref 39.0–52.0)
HEMOGLOBIN: 5.1 g/dL — AB (ref 13.0–17.0)
Potassium: 3.6 mmol/L (ref 3.5–5.1)
SODIUM: 144 mmol/L (ref 135–145)
TCO2: 24 mmol/L (ref 22–32)

## 2018-08-12 LAB — BASIC METABOLIC PANEL WITH GFR
Anion gap: 9 (ref 5–15)
BUN: 69 mg/dL — ABNORMAL HIGH (ref 8–23)
CO2: 27 mmol/L (ref 22–32)
Calcium: 8.6 mg/dL — ABNORMAL LOW (ref 8.9–10.3)
Chloride: 112 mmol/L — ABNORMAL HIGH (ref 98–111)
Creatinine, Ser: 1.03 mg/dL (ref 0.61–1.24)
GFR calc Af Amer: >60 mL/min
GFR calc non Af Amer: >60 mL/min
Glucose, Bld: 120 mg/dL — ABNORMAL HIGH (ref 70–99)
Potassium: 4 mmol/L (ref 3.5–5.1)
Sodium: 148 mmol/L — ABNORMAL HIGH (ref 135–145)

## 2018-08-12 LAB — URINALYSIS, ROUTINE W REFLEX MICROSCOPIC
BILIRUBIN URINE: NEGATIVE
Glucose, UA: NEGATIVE mg/dL
Ketones, ur: NEGATIVE mg/dL
NITRITE: NEGATIVE
PH: 5 (ref 5.0–8.0)
Protein, ur: NEGATIVE mg/dL
Specific Gravity, Urine: 1.015 (ref 1.005–1.030)
WBC, UA: >50 WBC/hpf — ABNORMAL HIGH (ref 0–5)

## 2018-08-12 LAB — CBG MONITORING, ED: GLUCOSE-CAPILLARY: 101 mg/dL — AB (ref 70–99)

## 2018-08-12 LAB — I-STAT TROPONIN, ED: Troponin i, poc: 0.01 ng/mL (ref 0.00–0.08)

## 2018-08-12 LAB — PREPARE RBC (CROSSMATCH)

## 2018-08-12 LAB — POC OCCULT BLOOD, ED: Fecal Occult Bld: POSITIVE — AB

## 2018-08-12 LAB — ABO/RH: ABO/RH(D): O POS

## 2018-08-12 MED ORDER — FIRST-DUKES MOUTHWASH MT SUSP: 5.0000 mL | Freq: Four times a day (QID) | OROMUCOSAL | Status: DC

## 2018-08-12 MED ORDER — SODIUM CHLORIDE 0.9% IV SOLUTION
Freq: Once | INTRAVENOUS | Status: AC
  Administered 2018-08-12: 22:00:00 via INTRAVENOUS

## 2018-08-12 MED ORDER — GUAIFENESIN ER 600 MG PO TB12: 1200.0000 mg | ORAL_TABLET | Freq: Two times a day (BID) | ORAL | Status: DC | PRN

## 2018-08-12 MED ORDER — PROCHLORPERAZINE MALEATE 10 MG PO TABS: 10.0000 mg | ORAL_TABLET | Freq: Four times a day (QID) | ORAL | Status: DC | PRN

## 2018-08-12 MED ORDER — FOLIC ACID 1 MG PO TABS
1.0000 mg | ORAL_TABLET | Freq: Every day | ORAL | Status: DC
  Administered 2018-08-15 – 2018-08-16 (×2): 1 mg via ORAL
  Filled 2018-08-12 (×2): qty 1

## 2018-08-12 MED ORDER — ONDANSETRON HCL 4 MG/2ML IJ SOLN: 4.0000 mg | Freq: Four times a day (QID) | INTRAMUSCULAR | Status: DC | PRN

## 2018-08-12 MED ORDER — ALPRAZOLAM 0.25 MG PO TABS
0.2500 mg | ORAL_TABLET | Freq: Every day | ORAL | Status: DC
  Administered 2018-08-12 – 2018-08-14 (×2): 0.25 mg via ORAL
  Filled 2018-08-12 (×2): qty 1

## 2018-08-12 MED ORDER — MAGIC MOUTHWASH
5.0000 mL | Freq: Four times a day (QID) | ORAL | Status: DC
  Administered 2018-08-12 – 2018-08-16 (×6): 5 mL via ORAL
  Filled 2018-08-12 (×15): qty 5

## 2018-08-12 MED ORDER — MOMETASONE FURO-FORMOTEROL FUM 200-5 MCG/ACT IN AERO
2.0000 | INHALATION_SPRAY | Freq: Two times a day (BID) | RESPIRATORY_TRACT | Status: DC
  Administered 2018-08-12 – 2018-08-16 (×8): 2 via RESPIRATORY_TRACT
  Filled 2018-08-12 (×2): qty 8.8

## 2018-08-12 MED ORDER — PANTOPRAZOLE SODIUM 40 MG IV SOLR
40.0000 mg | Freq: Two times a day (BID) | INTRAVENOUS | Status: DC
  Administered 2018-08-16: 40 mg via INTRAVENOUS
  Filled 2018-08-12: qty 40

## 2018-08-12 MED ORDER — ONDANSETRON HCL 4 MG PO TABS: 4.0000 mg | ORAL_TABLET | Freq: Four times a day (QID) | ORAL | Status: DC | PRN

## 2018-08-12 MED ORDER — SODIUM CHLORIDE 0.9 % IV SOLN
INTRAVENOUS | Status: DC
  Administered 2018-08-12 – 2018-08-13 (×4): via INTRAVENOUS

## 2018-08-12 MED ORDER — PANTOPRAZOLE SODIUM 40 MG IV SOLR
40.0000 mg | Freq: Two times a day (BID) | INTRAVENOUS | Status: DC
  Filled 2018-08-12 (×2): qty 40

## 2018-08-12 MED ORDER — PREDNISONE 20 MG PO TABS
20.0000 mg | ORAL_TABLET | Freq: Two times a day (BID) | ORAL | Status: DC
  Administered 2018-08-14 – 2018-08-16 (×4): 20 mg via ORAL
  Filled 2018-08-12 (×4): qty 1

## 2018-08-12 MED ORDER — SODIUM CHLORIDE 0.9% IV SOLUTION
Freq: Once | INTRAVENOUS | Status: AC
  Administered 2018-08-12: 10 mL/h via INTRAVENOUS

## 2018-08-12 MED ORDER — SODIUM CHLORIDE 0.9 % IV BOLUS
1000.0000 mL | Freq: Once | INTRAVENOUS | Status: AC
  Administered 2018-08-12: 1000 mL via INTRAVENOUS

## 2018-08-12 MED ORDER — SODIUM CHLORIDE 0.9 % IV SOLN
80.0000 mg | Freq: Once | INTRAVENOUS | Status: AC
  Administered 2018-08-12: 80 mg via INTRAVENOUS
  Filled 2018-08-12: qty 80

## 2018-08-12 MED ORDER — SODIUM CHLORIDE 0.9 % IV SOLN
8.0000 mg/h | INTRAVENOUS | Status: AC
  Administered 2018-08-12 – 2018-08-15 (×6): 8 mg/h via INTRAVENOUS
  Filled 2018-08-12 (×10): qty 80

## 2018-08-12 NOTE — ED Notes (Signed)
ED TO INPATIENT HANDOFF REPORT  Name/Age/Gender Gabriel [name] 77 y.o. male  Code Status Code Status History    Date Active Date Inactive Code Status Order ID Comments User Context   08/23/2015 1807 09/14/2015 1507 Full Code [id number]  Grace Isaac, MD Inpatient   07/28/2015 1044 08/18/2015 1731 Full Code [id number]  [name], MD Inpatient   04/04/2015 2258 04/08/2015 1300 Full Code [id number]  Grace Isaac, MD ED      Home/SNF/Other Home  Chief Complaint syncope  Level of Care/Admitting Diagnosis ED Disposition    ED Disposition Condition [facility] Area: [facility] [[id number]]  Level of Care: Telemetry [5]  Admit to tele based on following criteria: Eval of Syncope  Diagnosis: Symptomatic anemia [[id number]]  Admitting Physician: Elwyn Reach [[id number]]  Attending Physician: Elwyn Reach [[id number]]  Estimated length of stay: past midnight tomorrow  Certification:: I certify this patient will need inpatient services for at least 2 midnights  PT Class (Do Not Modify): Inpatient [101]  PT Acc Code (Do Not Modify): Private [1]       Medical History Past Medical History:  Diagnosis Date  . Cancer ([place])    skin cancer  . Constipation    AFTER ANESTHESIA  . Difficulty sleeping   . History of nonmelanoma skin cancer   . Hypertension 10/30/2016  . Inguinal hernia   . Lung cancer ([place])   . Lung mass    right / HAS HAD RADIATION AND CHEMO FOR LUNG CANCER  . Pneumothorax on right 08/24/2015  . Productive cough    "DUE TO RADIATION"  . Radiation 05/11/15-06/21/15   NSCLCA/right lung  . Smoking 1/2 pack a day or less 09/17/2016  . Spontaneous pneumothorax MANY YRS AGO AND AGAIN 04/04/15   HISTORY OF (ONLY)  . Weight loss 04/01/2017    Allergies Allergies  Allergen Reactions  . Glycopyrrolate Other (See Comments)    Urinary retention  . Aleve [Naproxen] Rash    IV Location/Drains/Wounds Patient Lines/Drains/Airways  Status   Active Line/Drains/Airways    Name:   Placement date:   Placement time:   Site:   Days:   Peripheral IV 11/20/16 Left Antecubital   11/20/16    1050    Antecubital   630   Peripheral IV 02/01/17 Right Antecubital   02/01/17    1714    Antecubital   557   Peripheral IV 07/19/17 Right Antecubital   07/19/17    1758    Antecubital   389   Peripheral IV 04/25/18 Right Antecubital   04/25/18    1602    Antecubital   109   Peripheral IV 08/12/18 Left Forearm   08/12/18    1624    Forearm   less than 1   Peripheral IV 08/12/18 Right Forearm   08/12/18    1813    Forearm   less than 1   Peripheral IV 08/12/18 Left Antecubital   08/12/18    1928    Antecubital   less than 1   Chest Tube Right Pleural   08/24/15    -    Pleural   1084   Urethral Catheter Jessica 16 Fr.   06/30/18    1608    -   43   Incision (Closed) 08/02/15 Chest Right   08/02/15    0906     1106   Incision (Closed) 08/24/15 Chest Right   08/24/15    -  1084   Pressure Ulcer 09/13/15 Unstageable - Full thickness tissue loss in which the base of the ulcer is covered by slough (yellow, tan, gray, green or brown) and/or eschar (tan, brown or black) in the wound bed. Small circular area located lower sacrum:  this   09/13/15    0940     1064   Pressure Injury 06/30/18 Stage II -  Partial thickness loss of dermis presenting as a shallow open ulcer with a red, pink wound bed without slough.   06/30/18    1712     43          Labs/Imaging Results for orders placed or performed during the hospital encounter of 08/12/18 (from the past 48 hour(s))  Basic metabolic panel     Status: Abnormal   Collection Time: 08/12/18  4:43 PM  Result Value Ref Range   Sodium 148 (H) 135 - 145 mmol/L   Potassium 4.0 3.5 - 5.1 mmol/L   Chloride 112 (H) 98 - 111 mmol/L   CO2 27 22 - 32 mmol/L   Glucose, Bld 120 (H) 70 - 99 mg/dL   BUN 69 (H) 8 - 23 mg/dL   Creatinine, Ser 1.03 0.61 - 1.24 mg/dL   Calcium 8.6 (L) 8.9 - 10.3 mg/dL   GFR calc  non Af Amer >60 >60 mL/min   GFR calc Af Amer >60 >60 mL/min    Comment: (NOTE) The eGFR has been calculated using the CKD EPI equation. This calculation has not been validated in all clinical situations. eGFR's persistently <60 mL/min signify possible Chronic Kidney Disease.    Anion gap 9 5 - 15    Comment: Performed at [facility], [place] [address], [place], [place] [address]  CBC     Status: Abnormal   Collection Time: 08/12/18  4:43 PM  Result Value Ref Range   WBC 2.1 (L) 4.0 - 10.5 K/uL   RBC 1.94 (L) 4.22 - 5.81 MIL/uL   Hemoglobin 6.1 (LL) 13.0 - 17.0 g/dL    Comment: REPEATED TO VERIFY THIS RESULT HAS BEEN CALLED TO J.INMAN BY NATHAN THOMPSON ON 10 15 2019 AT 1732, AND HAS BEEN READ BACK. CRITICAL RESULT VERIFIED    HCT 20.1 (L) 39.0 - 52.0 %   MCV 103.6 (H) 80.0 - 100.0 fL   MCH 31.4 26.0 - 34.0 pg   MCHC 30.3 30.0 - 36.0 g/dL   RDW 20.7 (H) 11.5 - 15.5 %   Platelets 131 (L) 150 - 400 K/uL   nRBC 0.0 0.0 - 0.2 %    Comment: Performed at [facility], [place] [address], [place], [place] [address]  Urinalysis, Routine w reflex microscopic     Status: Abnormal   Collection Time: 08/12/18  4:43 PM  Result Value Ref Range   Color, Urine YELLOW YELLOW   APPearance HAZY (A) CLEAR   Specific Gravity, Urine 1.015 1.005 - 1.030   pH 5.0 5.0 - 8.0   Glucose, UA NEGATIVE NEGATIVE mg/dL   Hgb urine dipstick SMALL (A) NEGATIVE   Bilirubin Urine NEGATIVE NEGATIVE   Ketones, ur NEGATIVE NEGATIVE mg/dL   Protein, ur NEGATIVE NEGATIVE mg/dL   Nitrite NEGATIVE NEGATIVE   Leukocytes, UA MODERATE (A) NEGATIVE   RBC / HPF 0-5 0 - 5 RBC/hpf   WBC, UA >50 (H) 0 - 5 WBC/hpf   Bacteria, UA MANY (A) NONE SEEN   Mucus PRESENT     Comment: Performed at [facility],  Center [address] [place], [place] [address]  I-stat troponin, ED     Status: None   Collection Time: 08/12/18  4:55 PM  Result Value Ref Range   Troponin i, poc 0.01 0.00  - 0.08 ng/mL   Comment 3            Comment: Due to the release kinetics of cTnI, a negative result within the first hours of the onset of symptoms does not rule out myocardial infarction with certainty. If myocardial infarction is still suspected, repeat the test at appropriate intervals.   I-Stat Chem 8, ED     Status: Abnormal   Collection Time: 08/12/18  4:58 PM  Result Value Ref Range   Sodium 144 135 - 145 mmol/L   Potassium 3.6 3.5 - 5.1 mmol/L   Chloride 111 98 - 111 mmol/L   BUN 68 (H) 8 - 23 mg/dL   Creatinine, Ser 0.90 0.61 - 1.24 mg/dL   Glucose, Bld 108 (H) 70 - 99 mg/dL   Calcium, Ion 1.13 (L) 1.15 - 1.40 mmol/L   TCO2 24 22 - 32 mmol/L   Hemoglobin 5.1 (LL) 13.0 - 17.0 g/dL   HCT 15.0 (L) 39.0 - 52.0 %   Comment NOTIFIED PHYSICIAN   CBG monitoring, ED     Status: Abnormal   Collection Time: 08/12/18  5:25 PM  Result Value Ref Range   Glucose-Capillary 101 (H) 70 - 99 mg/dL  Type and screen [place]     Status: None (Preliminary result)   Collection Time: 08/12/18  5:35 PM  Result Value Ref Range   ABO/RH(D) O POS    Antibody Screen NEG    Sample Expiration 08/15/2018    Unit Number [id number]    Blood Component Type RED CELLS,LR    Unit division 00    Status of Unit ALLOCATED    Transfusion Status OK TO TRANSFUSE    Crossmatch Result      Compatible Performed at [facility], [place] [address] [place], [place] [address]   Prepare RBC     Status: None   Collection Time: 08/12/18  5:35 PM  Result Value Ref Range   Order Confirmation      ORDER PROCESSED BY BLOOD BANK Performed at [facility] [address], [place], [place] [address]   ABO/Rh     Status: None (Preliminary result)   Collection Time: 08/12/18  5:35 PM  Result Value Ref Range   ABO/RH(D)      O POS Performed at [facility], [place] [address] [place], [place] [address]   POC occult blood, ED     Status:  Abnormal   Collection Time: 08/12/18  6:08 PM  Result Value Ref Range   Fecal Occult Bld POSITIVE (A) NEGATIVE   Dg Chest 2 View  Result Date: 08/12/2018 CLINICAL DATA:  Increasing weakness and fatigue. EXAM: CHEST - 2 VIEW COMPARISON:  06/18/2018 CXR, chest CT 07/15/2018 FINDINGS: Confluent opacity with volume loss involving the right upper lung. Stable aerated appearance of the right lung base with compensatory hyperinflation of the left lung. Uncoiled appearance of the thoracic aorta. Top-normal heart size. Chronic atelectasis/consolidation along the posteromedial aspect of the right lung base. IMPRESSION: Chronic stable pulmonary consolidations in the right upper and lower lobes. Compensatory hyperinflation of the left lung unchanged. Electronically Signed   By: Ashley Royalty M.D.   On: 08/12/2018 17:25    Pending Labs Unresulted Labs (From admission,  onward)    Start     Ordered   Signed and Held  Comprehensive metabolic panel  Tomorrow morning,   R     Signed and Held   Signed and Held  CBC  Now then every 6 hours,   R     Signed and Held   Visual merchandiser and Held  Type and screen [place]  Once,   R    Comments:  [place]    Signed and Held   Signed and Held  Prepare RBC  (Adult Blood Administration - Red Blood Cells)  Once,   R    Question Answer Comment  # of Units 2 units   Transfusion Indications Symptomatic Anemia   If emergent release call blood bank [facility] [phone]      Signed and Held          Vitals/Pain Today's Vitals   08/12/18 1651 08/12/18 1756 08/12/18 1834 08/12/18 1845  BP:  136/67 (!) 87/75 106/68  Pulse: (!) 109 (!) 115  (!) 103  Resp: (!) 25 (!) 36 (!) 34 (!) 24  Temp:      TempSrc:      SpO2: 100% 100%  95%    Isolation Precautions No active isolations  Medications Medications  pantoprazole (PROTONIX) injection 40 mg (has no administration in time range)  sodium chloride 0.9 % bolus 1,000 mL  (1,000 mLs Intravenous New Bag/Given 08/12/18 1928)  0.9 %  sodium chloride infusion (Manually program via Guardrails IV Fluids) (10 mL/hr Intravenous New Bag/Given 08/12/18 1831)    Mobility walks with person assist (dizzy and short of breath)

## 2018-08-12 NOTE — ED Provider Notes (Signed)
[facility] DEPT Provider Note   CSN: [id number] Arrival date & time: 08/12/18  1613     History   Chief Complaint Chief Complaint  Patient presents with  . Shortness of Breath    HPI Gabriel [name] is a 77 y.o. male.  77 yo M with a chief complaint of shortness of breath.  Going on for the past couple days.  Patient has a history of lung cancer and is currently undergoing chemotherapy.  Was last dosed on Monday.  He denies fevers or chills denies cough or congestion.  Denies chest pain.  He denies abdominal pain vomiting or diarrhea.  For the last couple days he is only been able to walk about 10 steps and becomes very short of breath and has to stop and take a break.  He denies diaphoresis with that.  He had one episode today as it sounded like he syncopized.  Denies history of PE.    The history is provided by the patient.  Shortness of Breath  This is a new problem. The average episode lasts 2 days. The problem occurs continuously.The current episode started 2 days ago. The problem has been rapidly worsening. Pertinent negatives include no fever, no headaches, no chest pain, no vomiting, no abdominal pain and no rash. He has tried nothing for the symptoms. The treatment provided no relief. He has had no prior hospitalizations. He has had no prior ED visits. Associated medical issues include COPD and chronic lung disease.    Past Medical History:  Diagnosis Date  . Cancer ([place])    skin cancer  . Constipation    AFTER ANESTHESIA  . Difficulty sleeping   . History of nonmelanoma skin cancer   . Hypertension 10/30/2016  . Inguinal hernia   . Lung cancer ([place])   . Lung mass    right / HAS HAD RADIATION AND CHEMO FOR LUNG CANCER  . Pneumothorax on right 08/24/2015  . Productive cough    "DUE TO RADIATION"  . Radiation 05/11/15-06/21/15   NSCLCA/right lung  . Smoking 1/2 pack a day or less 09/17/2016  . Spontaneous pneumothorax MANY YRS AGO AND  AGAIN 04/04/15   HISTORY OF (ONLY)  . Weight loss 04/01/2017    Patient Active Problem List   Diagnosis Date Noted  . Symptomatic anemia 08/12/2018  . 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 Diarrhea 03/31/2018  . Chronic cough 03/19/2018  . Goals of care, counseling/discussion 06/24/2017  . Malignant neoplasm of upper lobe of right lung ([place]) 05/27/2017  . H/O vitamin D deficiency 05/22/2017  . Weight loss 04/01/2017  . Protein-calorie malnutrition ([place]) 11/26/2016  . Acute bronchitis 11/12/2016  . Hypertension 10/30/2016  . Smoking 1/2 pack a day or less 09/17/2016  . Neoplastic malignant related fatigue 05/15/2016  . Stage 2 moderate COPD by GOLD classification ([place]) 02/14/2016  . Hoarseness of voice 02/14/2016  . Encounter for antineoplastic immunotherapy 02/01/2016  . Pressure ulcer 09/14/2015  . Palliative care encounter 09/07/2015  . Other fatigue   . Right-sided chest pain 08/31/2015  . COPD exacerbation ([place]) 08/04/2015  . Pulmonary emphysema ([place]) 07/28/2015  . Malignant neoplasm of lung ([place]) 07/28/2015  . Encounter for antineoplastic chemotherapy 05/09/2015  . Non-small cell carcinoma of right lung, stage 3 ([place]) 04/21/2015  . Hemoptysis, unspecified 10/07/2013    Past Surgical History:  Procedure Laterality Date  . CHEST TUBE INSERTION Right 08/02/2015   Procedure: INSERTION OF SECOND RIGHT CHEST TUBE WITH  FLUROSCOPY;  Surgeon: Grace Isaac, MD;  Location: [facility];  Service: Thoracic;  Laterality: Right;  . CHEST TUBE INSERTION  07/28/2015  . COLONOSCOPY W/ BIOPSIES AND POLYPECTOMY    . HERNIA REPAIR  2011  . INGUINAL HERNIA REPAIR Left 07/15/2015   Procedure: OPEN LEFT INGUINAL HERNIA REPAIR;  Surgeon: Johnathan Hausen, MD;  Location: [facility];  Service: General;  Laterality: Left;  With MESH  . TONSILLECTOMY    . VIDEO BRONCHOSCOPY WITH ENDOBRONCHIAL ULTRASOUND N/A 04/18/2015   Procedure: VIDEO BRONCHOSCOPY WITH ENDOBRONCHIAL ULTRASOUND;   Surgeon: Grace Isaac, MD;  Location: [facility];  Service: Thoracic;  Laterality: N/A;  . VIDEO BRONCHOSCOPY WITH INSERTION OF INTERBRONCHIAL VALVE (IBV) N/A 08/02/2015   Procedure: VIDEO BRONCHOSCOPY WITH ATTEMPTED INSERTION OF INTERBRONCHIAL VALVE (IBV) WITH FLUROSCOPY;  Surgeon: Grace Isaac, MD;  Location: [facility];  Service: Thoracic;  Laterality: N/A;  . WISDOM TOOTH EXTRACTION          Home Medications    Prior to Admission medications   Medication Sig Start Date End Date Taking? Authorizing Provider  ALPRAZolam (XANAX) 0.25 MG tablet Take 1 tablet (0.25 mg total) by mouth at bedtime. 08/04/18  Yes Curt Bears, MD  dexamethasone (DECADRON) 4 MG tablet Take 4 mg twice a day the day before, day of and day after chemo 08/04/18  Yes Curt Bears, MD  Diphenhyd-Hydrocort-Nystatin (FIRST-DUKES MOUTHWASH) SUSP Take 5 mLs by mouth QID. 08/08/18  Yes Brand Males, MD  folic acid (FOLVITE) 1 MG tablet Take 1 tablet (1 mg total) by mouth daily. 04/28/18  Yes Curt Bears, MD  guaiFENesin (MUCINEX) 600 MG 12 hr tablet Take 1 tablet (600 mg total) by mouth 2 (two) times daily as needed. Patient taking differently: Take 1,200 mg by mouth 2 (two) times daily as needed for cough or to loosen phlegm.  09/14/15  Yes Lars Pinks M, PA-C  ibuprofen (ADVIL,MOTRIN) 200 MG tablet Take 400 mg by mouth every 4 (four) hours as needed for moderate pain.    Yes [provider]  oxyCODONE-acetaminophen (PERCOCET/ROXICET) 5-325 MG tablet Take 1 tablet by mouth every 8 (eight) hours as needed for severe pain. 07/16/18  Yes Curcio, Roselie Awkward, NP  predniSONE (DELTASONE) 10 MG tablet TAKE 1 TABLET BY MOUTH TWICE A DAY 07/25/18  Yes Curt Bears, MD  prochlorperazine (COMPAZINE) 10 MG tablet Take 1 tablet (10 mg total) by mouth every 6 (six) hours as needed for nausea or vomiting. 04/28/18  Yes Curt Bears, MD  SYMBICORT 160-4.5 MCG/ACT inhaler INHALE 2 PUFFS TWICE DAILY 03/31/18  Yes  Brand Males, MD  B Complex Vitamins (B COMPLEX PO) Take 1 capsule by mouth daily.     [provider]  dronabinol (MARINOL) 2.5 MG capsule Take 1 capsule (2.5 mg total) by mouth 2 (two) times daily before a meal. Patient not taking: Reported on 07/16/2018 03/03/18   Curt Bears, MD  methylphenidate (RITALIN) 5 MG tablet Take 1 tablet a day. Patient not taking: Reported on 07/16/2018 06/04/18   Maryanna Shape, NP  ondansetron (ZOFRAN) 8 MG tablet Take 1 tablet (8 mg total) by mouth every 8 (eight) hours as needed for nausea or vomiting. Patient not taking: Reported on 08/12/2018 07/16/18   Maryanna Shape, NP  temazepam (RESTORIL) 15 MG capsule Take 1 capsule (15 mg total) by mouth at bedtime as needed for sleep. Patient not taking: Reported on 07/16/2018 04/28/18   Curt Bears, MD    Family History Family History  Problem Relation Age of Onset  . Heart attack Father   . Heart disease Father   . Hypertension Mother     Social History Social History   Tobacco Use  . Smoking status: Current Every Day Smoker    Packs/day: 0.25    Years: 55.00    Pack years: 13.75    Types: Cigarettes  . Smokeless tobacco: Never Used  . Tobacco comment: 1/2 ppd 7.25.18 ee  Substance Use Topics  . Alcohol use: Yes    Alcohol/week: 0.0 standard drinks    Comment: every other night -social  . Drug use: No     Allergies   Glycopyrrolate and Aleve [naproxen]   Review of Systems Review of Systems  Constitutional: Negative for chills and fever.  HENT: Negative for congestion and facial swelling.   Eyes: Negative for discharge and visual disturbance.  Respiratory: Positive for shortness of breath.   Cardiovascular: Negative for chest pain and palpitations.  Gastrointestinal: Negative for abdominal pain, diarrhea and vomiting.  Musculoskeletal: Negative for arthralgias and myalgias.  Skin: Negative for color change and rash.  Neurological: Negative for tremors, syncope and  headaches.  Psychiatric/Behavioral: Negative for confusion and dysphoric mood.     Physical Exam Updated Vital Signs BP (!) 82/52   Pulse 73   Temp 98.2 F (36.8 C) (Oral)   Resp 20   SpO2 100%   Physical Exam  Constitutional: He is oriented to person, place, and time. He appears well-developed and well-nourished.  Cachectic  HENT:  Head: Normocephalic and atraumatic.  Eyes: Pupils are equal, round, and reactive to light. EOM are normal.  Neck: Normal range of motion. Neck supple. No JVD present.  Cardiovascular: Normal rate and regular rhythm. Exam reveals no gallop and no friction rub.  No murmur heard. Pulmonary/Chest: No stridor. No respiratory distress. He has no wheezes. He has no rales.  Abdominal: He exhibits no distension and no mass. There is no tenderness. There is no rebound and no guarding.  Genitourinary:  Genitourinary Comments: Stage I ulcer to the sacrum easily blanching erythematous.  He does have a small open lesion about eraser size.  Musculoskeletal: Normal range of motion.  Neurological: He is alert and oriented to person, place, and time.  Skin: No rash noted. No pallor.  Psychiatric: He has a normal mood and affect. His behavior is normal.  Nursing note and vitals reviewed.    ED Treatments / Results  Labs (all labs ordered are listed, but only abnormal results are displayed) Labs Reviewed  BASIC METABOLIC PANEL - Abnormal; Notable for the following components:      Result Value   Sodium 148 (*)    Chloride 112 (*)    Glucose, Bld 120 (*)    BUN 69 (*)    Calcium 8.6 (*)    All other components within normal limits  CBC - Abnormal; Notable for the following components:   WBC 2.1 (*)    RBC 1.94 (*)    Hemoglobin 6.1 (*)    HCT 20.1 (*)    MCV 103.6 (*)    RDW 20.7 (*)    Platelets 131 (*)    All other components within normal limits  URINALYSIS, ROUTINE W REFLEX MICROSCOPIC - Abnormal; Notable for the following components:   APPearance  HAZY (*)    Hgb urine dipstick SMALL (*)    Leukocytes, UA MODERATE (*)    WBC, UA >50 (*)    Bacteria, UA MANY (*)  All other components within normal limits  CBC - Abnormal; Notable for the following components:   WBC 2.4 (*)    RBC 1.64 (*)    Hemoglobin 5.1 (*)    HCT 17.4 (*)    MCV 106.1 (*)    MCHC 29.3 (*)    RDW 20.6 (*)    Platelets 113 (*)    All other components within normal limits  CBG MONITORING, ED - Abnormal; Notable for the following components:   Glucose-Capillary 101 (*)    All other components within normal limits  I-STAT CHEM 8, ED - Abnormal; Notable for the following components:   BUN 68 (*)    Glucose, Bld 108 (*)    Calcium, Ion 1.13 (*)    Hemoglobin 5.1 (*)    HCT 15.0 (*)    All other components within normal limits  POC OCCULT BLOOD, ED - Abnormal; Notable for the following components:   Fecal Occult Bld POSITIVE (*)    All other components within normal limits  COMPREHENSIVE METABOLIC PANEL  CBC  CBC  I-STAT TROPONIN, ED  TYPE AND SCREEN  PREPARE RBC (CROSSMATCH)  ABO/RH  PREPARE RBC (CROSSMATCH)    EKG None  Radiology Dg Chest 2 View  Result Date: 08/12/2018 CLINICAL DATA:  Increasing weakness and fatigue. EXAM: CHEST - 2 VIEW COMPARISON:  06/18/2018 CXR, chest CT 07/15/2018 FINDINGS: Confluent opacity with volume loss involving the right upper lung. Stable aerated appearance of the right lung base with compensatory hyperinflation of the left lung. Uncoiled appearance of the thoracic aorta. Top-normal heart size. Chronic atelectasis/consolidation along the posteromedial aspect of the right lung base. IMPRESSION: Chronic stable pulmonary consolidations in the right upper and lower lobes. Compensatory hyperinflation of the left lung unchanged. Electronically Signed   By: Ashley Royalty M.D.   On: 08/12/2018 17:25    Procedures Procedures (including critical care time)  Medications Ordered in ED Medications  guaiFENesin (MUCINEX) 12 hr  tablet 1,200 mg (has no administration in time range)  mometasone-formoterol (DULERA) 200-5 MCG/ACT inhaler 2 puff (2 puffs Inhalation Given 41/93/79 0240)  folic acid (FOLVITE) tablet 1 mg (has no administration in time range)  prochlorperazine (COMPAZINE) tablet 10 mg (has no administration in time range)  predniSONE (DELTASONE) tablet 20 mg (has no administration in time range)  ALPRAZolam (XANAX) tablet 0.25 mg (0.25 mg Oral Given 08/12/18 2146)  0.9 %  sodium chloride infusion ( Intravenous New Bag/Given 08/12/18 2042)  ondansetron (ZOFRAN) tablet 4 mg (has no administration in time range)    Or  ondansetron (ZOFRAN) injection 4 mg (has no administration in time range)  pantoprazole (PROTONIX) 80 mg in sodium chloride 0.9 % 250 mL (0.32 mg/mL) infusion (8 mg/hr Intravenous New Bag/Given 08/12/18 2201)  pantoprazole (PROTONIX) injection 40 mg (has no administration in time range)  magic mouthwash (5 mLs Oral Given 08/12/18 2145)  0.9 %  sodium chloride infusion (Manually program via Guardrails IV Fluids) (10 mL/hr Intravenous New Bag/Given 08/12/18 1831)  sodium chloride 0.9 % bolus 1,000 mL (1,000 mLs Intravenous New Bag/Given 08/12/18 1928)  pantoprazole (PROTONIX) 80 mg in sodium chloride 0.9 % 100 mL IVPB (80 mg Intravenous New Bag/Given 08/12/18 2140)  0.9 %  sodium chloride infusion (Manually program via Guardrails IV Fluids) ( Intravenous New Bag/Given 08/12/18 2200)     Initial Impression / Assessment and Plan / ED Course  I have reviewed the triage vital signs and the nursing notes.  Pertinent labs & imaging results that were available  during my care of the patient were reviewed by me and considered in my medical decision making (see chart for details).     77 yo M with a chief complaint of shortness of breath on exertion.  This is gotten significantly worse over the past couple days.  He can only make it now about 10 steps without having to stop and take a break.  This is  concerning for multiple possible etiologies.  He does not normally use oxygen but is put himself on 2 L at home.  Will obtain a chest x-ray lab work reevaluate.  The patient's hemoglobin is 6.1.  This is 4 g drop from his most recent.  Patient has had some dark tarry stools going on for the past couple days.  He did just start iron supplementation.  My rectal exam with dark and tarry stools.  No gross blood.  This is somewhat complicated as the patient has had a drop of all of his cell lines likely from chemotherapy.  He does have a increased of his BUN.  I discussed the case with Dr. Alessandra Bevels, he recommended twice daily Protonix and he would see the patient in the morning and evaluation. Will transfuse blood.  CRITICAL CARE Performed by: Cecilio Asper   Total critical care time: 80 minutes  Critical care time was exclusive of separately billable procedures and treating other patients.  Critical care was necessary to treat or prevent imminent or life-threatening deterioration.  Critical care was time spent personally by me on the following activities: development of treatment plan with patient and/or surrogate as well as nursing, discussions with consultants, evaluation of patient's response to treatment, examination of patient, obtaining history from patient or surrogate, ordering and performing treatments and interventions, ordering and review of laboratory studies, ordering and review of radiographic studies, pulse oximetry and re-evaluation of patient's condition.   The patients results and plan were reviewed and discussed.   Any x-rays performed were independently reviewed by myself.   Differential diagnosis were considered with the presenting HPI.  Medications  guaiFENesin (MUCINEX) 12 hr tablet 1,200 mg (has no administration in time range)  mometasone-formoterol (DULERA) 200-5 MCG/ACT inhaler 2 puff (2 puffs Inhalation Given 76/72/09 4709)  folic acid (FOLVITE) tablet 1 mg  (has no administration in time range)  prochlorperazine (COMPAZINE) tablet 10 mg (has no administration in time range)  predniSONE (DELTASONE) tablet 20 mg (has no administration in time range)  ALPRAZolam (XANAX) tablet 0.25 mg (0.25 mg Oral Given 08/12/18 2146)  0.9 %  sodium chloride infusion ( Intravenous New Bag/Given 08/12/18 2042)  ondansetron (ZOFRAN) tablet 4 mg (has no administration in time range)    Or  ondansetron (ZOFRAN) injection 4 mg (has no administration in time range)  pantoprazole (PROTONIX) 80 mg in sodium chloride 0.9 % 250 mL (0.32 mg/mL) infusion (8 mg/hr Intravenous New Bag/Given 08/12/18 2201)  pantoprazole (PROTONIX) injection 40 mg (has no administration in time range)  magic mouthwash (5 mLs Oral Given 08/12/18 2145)  0.9 %  sodium chloride infusion (Manually program via Guardrails IV Fluids) (10 mL/hr Intravenous New Bag/Given 08/12/18 1831)  sodium chloride 0.9 % bolus 1,000 mL (1,000 mLs Intravenous New Bag/Given 08/12/18 1928)  pantoprazole (PROTONIX) 80 mg in sodium chloride 0.9 % 100 mL IVPB (80 mg Intravenous New Bag/Given 08/12/18 2140)  0.9 %  sodium chloride infusion (Manually program via Guardrails IV Fluids) ( Intravenous New Bag/Given 08/12/18 2200)    Vitals:   08/12/18 2017 08/12/18 2214 08/12/18  2225 08/12/18 2253  BP: 97/65  (!) 86/51 (!) 82/52  Pulse: (!) 102  (!) 106 73  Resp: (!) 26  20 20   Temp: 98.2 F (36.8 C)  (!) 97.5 F (36.4 C) 98.2 F (36.8 C)  TempSrc: Oral  Oral Oral  SpO2:  100%      Final diagnoses:  Symptomatic anemia  Antineoplastic chemotherapy induced pancytopenia ([place])    Admission/ observation were discussed with the admitting physician, patient and/or family and they are comfortable with the plan.   Final Clinical Impressions(s) / ED Diagnoses   Final diagnoses:  Symptomatic anemia  Antineoplastic chemotherapy induced pancytopenia [facility])    ED Discharge Orders    None       Deno Etienne, DO 08/12/18  2313

## 2018-08-12 NOTE — Telephone Encounter (Signed)
Pt called today  - onset of severe shortness of breath yesterday . He cannot walk 15 ft before feeling like he needs to pass out. Marland Kitchen He came close to passing out with ambulation. He sounded very sort of breath and weak . I called back and left message to go to ED now. This same message was left on daughters phone.

## 2018-08-12 NOTE — H&P (Signed)
History and Physical   Gabriel [name] FKC:[id number] DOB: Mar 25, 1941 DOA: 08/12/2018  Referring MD/NP/PA: Dr. Tyrone Nine, emergency care  PCP: Aura Dials, MD   Outpatient Specialists: Dr. Lorna Few, oncology  Patient coming from: Home  Chief Complaint: Shortness of breath and weakness  HPI: Gabriel [name] is a 77 y.o. male with medical history significant of adenocarcinoma of the right lung, skin cancer, hypertension, COPD who presented with shortness of breath and cough.  He denied any fever or chills no nausea vomiting or diarrhea.  Patient is currently on chemotherapy with last dose only yesterday.  Patient denied any melena or hematemesis no bright red blood per rectum.  He was seen in the ER and evaluated.  He was found to have pancytopenia with hemoglobin of 5.1 but also positive occult blood test.  Patient is therefore being admitted to the hospital for evaluation and treatment..  ED Course: Temperature is 97.5 blood pressure is 86/51 pulse 150 respirate of 36 oxygen sat 95% room air.  White count is 2.4 with hemoglobin 5.1 and platelet count of 113.  Sodium 144 potassium 3.6 chloride 111 BUN 68 creatinine 0.90 and calcium 8.6.  Glucose 108.  Fecal occult blood testing is positive x2.  Patient is typed and crossmatch and is being admitted to the hospital for work-up.  Review of Systems: As per HPI otherwise 10 point review of systems negative.    Past Medical History:  Diagnosis Date  . Cancer ([place])    skin cancer  . Constipation    AFTER ANESTHESIA  . Difficulty sleeping   . History of nonmelanoma skin cancer   . Hypertension 10/30/2016  . Inguinal hernia   . Lung cancer ([place])   . Lung mass    right / HAS HAD RADIATION AND CHEMO FOR LUNG CANCER  . Pneumothorax on right 08/24/2015  . Productive cough    "DUE TO RADIATION"  . Radiation 05/11/15-06/21/15   NSCLCA/right lung  . Smoking 1/2 pack a day or less 09/17/2016  . Spontaneous pneumothorax MANY YRS AGO AND  AGAIN 04/04/15   HISTORY OF (ONLY)  . Weight loss 04/01/2017    Past Surgical History:  Procedure Laterality Date  . CHEST TUBE INSERTION Right 08/02/2015   Procedure: INSERTION OF SECOND RIGHT CHEST TUBE WITH FLUROSCOPY;  Surgeon: Grace Isaac, MD;  Location: [facility];  Service: Thoracic;  Laterality: Right;  . CHEST TUBE INSERTION  07/28/2015  . COLONOSCOPY W/ BIOPSIES AND POLYPECTOMY    . HERNIA REPAIR  2011  . INGUINAL HERNIA REPAIR Left 07/15/2015   Procedure: OPEN LEFT INGUINAL HERNIA REPAIR;  Surgeon: Johnathan Hausen, MD;  Location: [facility];  Service: General;  Laterality: Left;  With MESH  . TONSILLECTOMY    . VIDEO BRONCHOSCOPY WITH ENDOBRONCHIAL ULTRASOUND N/A 04/18/2015   Procedure: VIDEO BRONCHOSCOPY WITH ENDOBRONCHIAL ULTRASOUND;  Surgeon: Grace Isaac, MD;  Location: [facility];  Service: Thoracic;  Laterality: N/A;  . VIDEO BRONCHOSCOPY WITH INSERTION OF INTERBRONCHIAL VALVE (IBV) N/A 08/02/2015   Procedure: VIDEO BRONCHOSCOPY WITH ATTEMPTED INSERTION OF INTERBRONCHIAL VALVE (IBV) WITH FLUROSCOPY;  Surgeon: Grace Isaac, MD;  Location: [facility];  Service: Thoracic;  Laterality: N/A;  . WISDOM TOOTH EXTRACTION       reports that he has been smoking cigarettes. He has a 13.75 pack-year smoking history. He has never used smokeless tobacco. He reports that he drinks alcohol. He reports that he does not use drugs.  Allergies  Allergen Reactions  . Glycopyrrolate Other (See  Comments)    Urinary retention  . Aleve [Naproxen] Rash    Family History  Problem Relation Age of Onset  . Heart attack Father   . Heart disease Father   . Hypertension Mother      Prior to Admission medications   Medication Sig Start Date End Date Taking? Authorizing Provider  ALPRAZolam (XANAX) 0.25 MG tablet Take 1 tablet (0.25 mg total) by mouth at bedtime. 08/04/18  Yes Curt Bears, MD  dexamethasone (DECADRON) 4 MG tablet Take 4 mg twice a day the day before, day of and day after chemo  08/04/18  Yes Curt Bears, MD  Diphenhyd-Hydrocort-Nystatin (FIRST-DUKES MOUTHWASH) SUSP Take 5 mLs by mouth QID. 08/08/18  Yes Brand Males, MD  folic acid (FOLVITE) 1 MG tablet Take 1 tablet (1 mg total) by mouth daily. 04/28/18  Yes Curt Bears, MD  guaiFENesin (MUCINEX) 600 MG 12 hr tablet Take 1 tablet (600 mg total) by mouth 2 (two) times daily as needed. Patient taking differently: Take 1,200 mg by mouth 2 (two) times daily as needed for cough or to loosen phlegm.  09/14/15  Yes Lars Pinks M, PA-C  ibuprofen (ADVIL,MOTRIN) 200 MG tablet Take 400 mg by mouth every 4 (four) hours as needed for moderate pain.    Yes [provider]  oxyCODONE-acetaminophen (PERCOCET/ROXICET) 5-325 MG tablet Take 1 tablet by mouth every 8 (eight) hours as needed for severe pain. 07/16/18  Yes Curcio, Roselie Awkward, NP  predniSONE (DELTASONE) 10 MG tablet TAKE 1 TABLET BY MOUTH TWICE A DAY 07/25/18  Yes Curt Bears, MD  prochlorperazine (COMPAZINE) 10 MG tablet Take 1 tablet (10 mg total) by mouth every 6 (six) hours as needed for nausea or vomiting. 04/28/18  Yes Curt Bears, MD  SYMBICORT 160-4.5 MCG/ACT inhaler INHALE 2 PUFFS TWICE DAILY 03/31/18  Yes Brand Males, MD  B Complex Vitamins (B COMPLEX PO) Take 1 capsule by mouth daily.     [provider]  dronabinol (MARINOL) 2.5 MG capsule Take 1 capsule (2.5 mg total) by mouth 2 (two) times daily before a meal. Patient not taking: Reported on 07/16/2018 03/03/18   Curt Bears, MD  methylphenidate (RITALIN) 5 MG tablet Take 1 tablet a day. Patient not taking: Reported on 07/16/2018 06/04/18   Maryanna Shape, NP  ondansetron (ZOFRAN) 8 MG tablet Take 1 tablet (8 mg total) by mouth every 8 (eight) hours as needed for nausea or vomiting. Patient not taking: Reported on 08/12/2018 07/16/18   Maryanna Shape, NP  temazepam (RESTORIL) 15 MG capsule Take 1 capsule (15 mg total) by mouth at bedtime as needed for  sleep. Patient not taking: Reported on 07/16/2018 04/28/18   Curt Bears, MD    Physical Exam: Vitals:   08/12/18 1651 08/12/18 1756 08/12/18 1834 08/12/18 1845  BP:  136/67 (!) 87/75 106/68  Pulse: (!) 109 (!) 115  (!) 103  Resp: (!) 25 (!) 36 (!) 34 (!) 24  Temp:      TempSrc:      SpO2: 100% 100%  95%      Constitutional: NAD, calm, comfortable, Cachectic Vitals:   08/12/18 1651 08/12/18 1756 08/12/18 1834 08/12/18 1845  BP:  136/67 (!) 87/75 106/68  Pulse: (!) 109 (!) 115  (!) 103  Resp: (!) 25 (!) 36 (!) 34 (!) 24  Temp:      TempSrc:      SpO2: 100% 100%  95%   Eyes: PERRL, lids and conjunctivae normal ENMT: Mucous membranes  are moist. Posterior pharynx clear of any exudate or lesions.Normal dentition.  Neck: normal, supple, no masses, no thyromegaly Respiratory: Porta cath, clear to auscultation bilaterally, no wheezing, no crackles. Normal respiratory effort. No accessory muscle use.  Cardiovascular: Regular rate and rhythm, no murmurs / rubs / gallops. No extremity edema. 2+ pedal pulses. No carotid bruits.  Abdomen: no tenderness, no masses palpated. No hepatosplenomegaly. Bowel sounds positive.  Musculoskeletal: no clubbing / cyanosis. No joint deformity upper and lower extremities. Good ROM, no contractures. Normal muscle tone.  Skin: no rashes, lesions, ulcers. No induration Neurologic: CN 2-12 grossly intact. Sensation intact, DTR normal. Strength 5/5 in all 4.  Psychiatric: Normal judgment and insight. Alert and oriented x 3. Normal mood.     Labs on Admission: I have personally reviewed following labs and imaging studies  CBC: Recent Labs  Lab 08/12/18 1643 08/12/18 1658  WBC 2.1*  --   HGB 6.1* 5.1*  HCT 20.1* 15.0*  MCV 103.6*  --   PLT 131*  --    Basic Metabolic Panel: Recent Labs  Lab 08/12/18 1643 08/12/18 1658  NA 148* 144  K 4.0 3.6  CL 112* 111  CO2 27  --   GLUCOSE 120* 108*  BUN 69* 68*  CREATININE 1.03 0.90  CALCIUM 8.6*   --    GFR: Estimated Creatinine Clearance: 42 mL/min (by C-G formula based on SCr of 0.9 mg/dL). Liver Function Tests: No results for input(s): AST, ALT, ALKPHOS, BILITOT, PROT, ALBUMIN in the last 168 hours. No results for input(s): LIPASE, AMYLASE in the last 168 hours. No results for input(s): AMMONIA in the last 168 hours. Coagulation Profile: No results for input(s): INR, PROTIME in the last 168 hours. Cardiac Enzymes: No results for input(s): CKTOTAL, CKMB, CKMBINDEX, TROPONINI in the last 168 hours. BNP (last 3 results) No results for input(s): PROBNP in the last 8760 hours. HbA1C: No results for input(s): HGBA1C in the last 72 hours. CBG: Recent Labs  Lab 08/12/18 1725  GLUCAP 101*   Lipid Profile: No results for input(s): CHOL, HDL, LDLCALC, TRIG, CHOLHDL, LDLDIRECT in the last 72 hours. Thyroid Function Tests: No results for input(s): TSH, T4TOTAL, FREET4, T3FREE, THYROIDAB in the last 72 hours. Anemia Panel: No results for input(s): VITAMINB12, FOLATE, FERRITIN, TIBC, IRON, RETICCTPCT in the last 72 hours. Urine analysis:    Component Value Date/Time   COLORURINE YELLOW 08/12/2018 1643   APPEARANCEUR HAZY (A) 08/12/2018 1643   LABSPEC 1.015 08/12/2018 1643   PHURINE 5.0 08/12/2018 1643   GLUCOSEU NEGATIVE 08/12/2018 1643   HGBUR SMALL (A) 08/12/2018 1643   BILIRUBINUR NEGATIVE 08/12/2018 1643   KETONESUR NEGATIVE 08/12/2018 1643   PROTEINUR NEGATIVE 08/12/2018 1643   NITRITE NEGATIVE 08/12/2018 1643   LEUKOCYTESUR MODERATE (A) 08/12/2018 1643   Sepsis Labs: @LABRCNTIP (procalcitonin:4,lacticidven:4) )No results found for this or any previous visit (from the past 240 hour(s)).   Radiological Exams on Admission: Dg Chest 2 View  Result Date: 08/12/2018 CLINICAL DATA:  Increasing weakness and fatigue. EXAM: CHEST - 2 VIEW COMPARISON:  06/18/2018 CXR, chest CT 07/15/2018 FINDINGS: Confluent opacity with volume loss involving the right upper lung. Stable  aerated appearance of the right lung base with compensatory hyperinflation of the left lung. Uncoiled appearance of the thoracic aorta. Top-normal heart size. Chronic atelectasis/consolidation along the posteromedial aspect of the right lung base. IMPRESSION: Chronic stable pulmonary consolidations in the right upper and lower lobes. Compensatory hyperinflation of the left lung unchanged. Electronically Signed   By: Shanon Brow  Randel Pigg M.D.   On: 08/12/2018 17:25      Assessment/Plan Principal Problem:   Symptomatic anemia Active Problems:   Non-small cell carcinoma of right lung, stage 3 (HCC)   Other fatigue   Hypertension     #1 symptomatic anemia: Part of patient's pancytopenia probably related to his cancer.  He is guaiac positive which could be secondary to thrombocytopenia.  Patient will be admitted and transfused 2 units of packed red blood cells.  Monitor H&H serially.  IV Protonix.  Monitor response and consider oncology consult in the morning.  #2 pancytopenia: Secondary to chemotherapy.  Monitor closely.  Neutropenia monitoring as well.  #3 non-small cell lung cancer stage III: Continue treatment per oncology.  Patient has quit smoking  #4 hypertension: Continue home blood pressure medications.  #5 generalized malaise and fatigue: Most likely due to anemia and cancer.   DVT prophylaxis: SCD Code Status: Full code Family Communication: Son and daughter who are in the room Disposition Plan: Home Consults called: Consult Dr. Earlie Server in the morning Admission status: Observation  Severity of Illness: The appropriate patient status for this patient is OBSERVATION. Observation status is judged to be reasonable and necessary in order to provide the required intensity of service to ensure the patient's safety. The patient's presenting symptoms, physical exam findings, and initial radiographic and laboratory data in the context of their medical condition is felt to place them at  decreased risk for further clinical deterioration. Furthermore, it is anticipated that the patient will be medically stable for discharge from the hospital within 2 midnights of admission. The following factors support the patient status of observation.   " The patient's presenting symptoms include shortness of breath and weakness. " The physical exam findings include cachexia. " The initial radiographic and laboratory data are hemoglobin of 5.1.     Barbette Merino MD [facility] Pager [phone]  If 7PM-7AM, please contact night-coverage [email or web address] Password [email or web address]  08/12/2018, 7:08 PM

## 2018-08-12 NOTE — ED Notes (Signed)
Bed: WA06 Expected date:  Expected time:  Means of arrival:  Comments: EMS-SOB

## 2018-08-12 NOTE — ED Notes (Signed)
Date and time results received: 08/12/18 17:32 (use smartphrase ".now" to insert current time)  Test: Hgb Critical Value: 6.1  Name of Provider Notified: Dr. Deno Etienne   Orders Received? Or Actions Taken?: Will continue to monitor and await for new orders.

## 2018-08-12 NOTE — ED Notes (Signed)
Patient sitting on side of bed using urinal and started to shake. Held patient and patient lost conciousness. EKG captured and BP taken after patient placed in bed. ED provider notified. Waiting for blood from blood bank.

## 2018-08-12 NOTE — Progress Notes (Signed)
CRITICAL VALUE ALERT  Critical Value:  hgb 5.1  Date & Time Notied:  08/12/18 2137  Provider Notified: Hilbert Bible, NP  Orders Received/Actions taken: 2 units PRBC ordered by admitting provider. Transfuse units as ordered.

## 2018-08-12 NOTE — ED Triage Notes (Addendum)
Patient arrived via [facility] from home. Patient has felt weak and increased fatigued x2days. Family states patient had "convulsion" like activity and collapsed and family lowered him to the ground (Sounds like a syncopal episode). Patient was tachypneic. Lung sounds clear. Home 2L O2 PRN.  HX. Lung cancer.  500NS giver per EMS

## 2018-08-13 ENCOUNTER — Encounter (HOSPITAL_COMMUNITY): Payer: Self-pay

## 2018-08-13 ENCOUNTER — Other Ambulatory Visit: Payer: Self-pay

## 2018-08-13 DIAGNOSIS — L98429 Non-pressure chronic ulcer of back with unspecified severity: Secondary | ICD-10-CM

## 2018-08-13 DIAGNOSIS — D649 Anemia, unspecified: Secondary | ICD-10-CM

## 2018-08-13 DIAGNOSIS — D61810 Antineoplastic chemotherapy induced pancytopenia: Secondary | ICD-10-CM

## 2018-08-13 DIAGNOSIS — E43 Unspecified severe protein-calorie malnutrition: Secondary | ICD-10-CM | POA: Diagnosis present

## 2018-08-13 DIAGNOSIS — L8990 Pressure ulcer of unspecified site, unspecified stage: Secondary | ICD-10-CM | POA: Diagnosis present

## 2018-08-13 DIAGNOSIS — C3491 Malignant neoplasm of unspecified part of right bronchus or lung: Secondary | ICD-10-CM

## 2018-08-13 DIAGNOSIS — T451X5A Adverse effect of antineoplastic and immunosuppressive drugs, initial encounter: Secondary | ICD-10-CM

## 2018-08-13 DIAGNOSIS — D62 Acute posthemorrhagic anemia: Secondary | ICD-10-CM

## 2018-08-13 LAB — CBC
HCT: 18.6 % — ABNORMAL LOW (ref 39.0–52.0)
HEMATOCRIT: 18.3 % — AB (ref 39.0–52.0)
HEMATOCRIT: 20.8 % — AB (ref 39.0–52.0)
HEMOGLOBIN: 5.8 g/dL — AB (ref 13.0–17.0)
HEMOGLOBIN: 5.9 g/dL — AB (ref 13.0–17.0)
Hemoglobin: 6.4 g/dL — CL (ref 13.0–17.0)
MCH: 31 pg (ref 26.0–34.0)
MCH: 31.1 pg (ref 26.0–34.0)
MCH: 29.6 pg (ref 26.0–34.0)
MCHC: 31.7 g/dL (ref 30.0–36.0)
MCHC: 31.7 g/dL (ref 30.0–36.0)
MCHC: 30.8 g/dL (ref 30.0–36.0)
MCV: 97.9 fL (ref 80.0–100.0)
MCV: 97.9 fL (ref 80.0–100.0)
MCV: 96.3 fL (ref 80.0–100.0)
NRBC: 1 % — AB (ref 0.0–0.2)
PLATELETS: 85 10*3/uL — AB (ref 150–400)
Platelets: 85 10*3/uL — ABNORMAL LOW (ref 150–400)
Platelets: 57 10*3/uL — ABNORMAL LOW (ref 150–400)
RBC: 1.87 MIL/uL — AB (ref 4.22–5.81)
RBC: 1.9 MIL/uL — AB (ref 4.22–5.81)
RBC: 2.16 MIL/uL — ABNORMAL LOW (ref 4.22–5.81)
RDW: 17.2 % — ABNORMAL HIGH (ref 11.5–15.5)
RDW: 17.2 % — ABNORMAL HIGH (ref 11.5–15.5)
RDW: 16.3 % — AB (ref 11.5–15.5)
WBC: 3 10*3/uL — AB (ref 4.0–10.5)
WBC: 3 10*3/uL — ABNORMAL LOW (ref 4.0–10.5)
WBC: 2.9 10*3/uL — AB (ref 4.0–10.5)
nRBC: 0 % (ref 0.0–0.2)
nRBC: 0.7 % — ABNORMAL HIGH (ref 0.0–0.2)

## 2018-08-13 LAB — COMPREHENSIVE METABOLIC PANEL
ALBUMIN: 2 g/dL — AB (ref 3.5–5.0)
ALK PHOS: 36 U/L — AB (ref 38–126)
ALT: 10 U/L (ref 0–44)
AST: 13 U/L — AB (ref 15–41)
Anion gap: 5 (ref 5–15)
BILIRUBIN TOTAL: 0.5 mg/dL (ref 0.3–1.2)
BUN: 72 mg/dL — AB (ref 8–23)
CALCIUM: 7.3 mg/dL — AB (ref 8.9–10.3)
CO2: 22 mmol/L (ref 22–32)
Chloride: 118 mmol/L — ABNORMAL HIGH (ref 98–111)
Creatinine, Ser: 0.94 mg/dL (ref 0.61–1.24)
GFR calc Af Amer: >60 mL/min (ref >60)
GFR calc non Af Amer: >60 mL/min (ref >60)
GLUCOSE: 137 mg/dL — AB (ref 70–99)
Potassium: 3.9 mmol/L (ref 3.5–5.1)
SODIUM: 145 mmol/L (ref 135–145)
TOTAL PROTEIN: 3.8 g/dL — AB (ref 6.5–8.1)

## 2018-08-13 LAB — PREPARE RBC (CROSSMATCH)

## 2018-08-13 LAB — MRSA PCR SCREENING: MRSA BY PCR: NEGATIVE

## 2018-08-13 LAB — HEMOGLOBIN AND HEMATOCRIT, BLOOD
HCT: 20.7 % — ABNORMAL LOW (ref 39.0–52.0)
Hemoglobin: 6.6 g/dL — CL (ref 13.0–17.0)

## 2018-08-13 MED ORDER — SODIUM CHLORIDE 0.9 % IV BOLUS
1000.0000 mL | Freq: Once | INTRAVENOUS | Status: AC
  Administered 2018-08-13: 1000 mL via INTRAVENOUS

## 2018-08-13 MED ORDER — SODIUM CHLORIDE 0.9% IV SOLUTION
Freq: Once | INTRAVENOUS | Status: AC
  Administered 2018-08-13: 09:00:00 via INTRAVENOUS

## 2018-08-13 MED ORDER — ADULT MULTIVITAMIN W/MINERALS CH
1.0000 | ORAL_TABLET | Freq: Every day | ORAL | Status: DC
  Administered 2018-08-15 – 2018-08-16 (×2): 1 via ORAL
  Filled 2018-08-13 (×2): qty 1

## 2018-08-13 MED ORDER — ORAL CARE MOUTH RINSE
15.0000 mL | Freq: Two times a day (BID) | OROMUCOSAL | Status: DC
  Administered 2018-08-14 – 2018-08-16 (×3): 15 mL via OROMUCOSAL

## 2018-08-13 MED ORDER — SODIUM CHLORIDE 0.9 % IV BOLUS
500.0000 mL | Freq: Once | INTRAVENOUS | Status: AC | PRN
  Administered 2018-08-13: 500 mL via INTRAVENOUS

## 2018-08-13 MED ORDER — SODIUM CHLORIDE 0.9 % IV BOLUS: 1000.0000 mL | Freq: Once | INTRAVENOUS | Status: DC

## 2018-08-13 MED ORDER — SODIUM CHLORIDE 0.9% IV SOLUTION
Freq: Once | INTRAVENOUS | Status: AC
  Administered 2018-08-13: 21:00:00 via INTRAVENOUS

## 2018-08-13 NOTE — Progress Notes (Signed)
Initial Nutrition Assessment  DOCUMENTATION CODES:   Underweight, Severe malnutrition in context of chronic illness  INTERVENTION:    Carnation Instant Breakfast PO once daily, each supplement provides 220 kcal and 13 grams of protein.   Boost Plus chocolate BID- Each supplement provides 360kcal and 14g protein.   Provide MVI daily  NUTRITION DIAGNOSIS:   Severe Malnutrition related to chronic illness, cancer and cancer related treatments as evidenced by percent weight loss, energy intake < or equal to 75% for > or equal to 1 month, severe fat depletion, severe muscle depletion.  GOAL:   Patient will meet greater than or equal to 90% of their needs  MONITOR:   PO intake, Supplement acceptance, Labs, Weight trends, Skin, I & O's  REASON FOR ASSESSMENT:   Malnutrition Screening Tool    ASSESSMENT:   Patient with PMH significant for adenocarcinoma of the right lung stage III, skin cancer, HTN, and COPD. Presents this admission with increasing shortness of breath and productive cough. Admitted with symptomatic anemia and pancytopenia secondary to chemotherapy.    Pt reports having an off and on appetite for over two years due to taste changes from chemotherapy. States some days he can eat more than others, but he attempts to consume at least two meals daily. Meals consist of a cinnamon bun with coffee for breakfast, a carnation instant breakfast for snack, and chicken soup or Boost for dinner. Pt mostly drinks pepsi or water. Pt unable to describe taste changes. Denies swallowing issues or nausea/vomiting. Discussed the importance of protein intake for preservation of lean body mass and wound healing. Encouraged pt to increase supplements to 4-5 times daily if meal intake remains minimal. Pt does not think he can do this. If meal and supplement intake remains poor, pt may benefit from long term feeding tube (if within goals of care) as pt is severely malnourished.   Pt endorses a  UBW of 140 lb. Records indicate pt weighed 113 lb on 02/03/18 and 97 lb one week ago (14.2% wt loss in 6 months, significant for time frame). Would recommend obtaining new weigh this admission to determine if pt has lost any further weight. Nutrition-Focused physical exam completed. Pt reports he was walking up until a few days ago.   Medications reviewed and include: folic acid, prednisone, NS @ 100 ml/hr Labs reviewed: corrected calcium 8.9 (wdl) calcium ionized 1.13 (L)  NUTRITION - FOCUSED PHYSICAL EXAM:    Most Recent Value  Orbital Region  Severe depletion  Upper Arm Region  Severe depletion  Thoracic and Lumbar Region  Severe depletion  Buccal Region  Severe depletion  Temple Region  Severe depletion  Clavicle Bone Region  Severe depletion  Clavicle and Acromion Bone Region  Severe depletion  Scapular Bone Region  Severe depletion  Dorsal Hand  Severe depletion  Patellar Region  Severe depletion  Anterior Thigh Region  Severe depletion  Posterior Calf Region  Severe depletion  Edema (RD Assessment)  None     Diet Order:   Diet Order            Diet NPO time specified  Diet effective now              EDUCATION NEEDS:   Education needs have been addressed  Skin:  Skin Assessment: Skin Integrity Issues: Skin Integrity Issues:: Stage III, Unstageable Stage III: sacrum Unstageable: sacrum  Last BM:  08/13/18  Height:   Ht Readings from Last 1 Encounters:  08/04/18 5\' 10"  (1.778  m)    Weight:   Wt Readings from Last 1 Encounters:  08/04/18 43.9 kg    Ideal Body Weight:  75.5 kg  BMI:  There is no height or weight on file to calculate BMI.  Estimated Nutritional Needs:   Kcal:  1550-1750 kcal  Protein:  75-90 grams   Fluid:  >/= 1.5 L/day  Mariana Single RD, LDN Clinical Nutrition Pager # [phone]

## 2018-08-13 NOTE — Progress Notes (Signed)
PROGRESS NOTE                                                                                                                                                                                                             Patient Demographics:    Gabriel [name], is a 77 y.o. male, DOB - 1940/12/04, LXB:[id number]  Admit date - 08/12/2018   Admitting Physician Elwyn Reach, MD  Outpatient Primary MD for the patient is Aura Dials, MD  LOS - 1  Outpatient Specialists: Dr. Julien Nordmann  Chief Complaint  Patient presents with  . Shortness of Breath       Brief Narrative 77 year old male with history of adenocarcinoma of the right lung, skin cancer, hypertension, COPD presented with increasing shortness of breath and some productive cough.  He reports that he has noticed some dark stool thinks that it was due to the iron supplement he has been on recently.  Denies any hematemesis or bright red blood per rectum.  Reports he has been taking Advil 2 tablets every 4 hours for several months for some pain and discomfort.  Denies any headache, dizziness or syncope.  In the ED he was found to have hemoglobin of 5.1 (baseline around 11) and positive Hemoccult.  Patient was also hypertensive with systolic blood pressure of 86, tachycardic and tachypneic.  His WBC was 2.4 and platelets of 113, sodium of 144 and potassium of 3.6. Patient admitted to telemetry and with 2 unit PRBC given.    Subjective:   Patient still feels shortness of breath on trying to get up.  Maintaining O2 sat on 3 L via nasal cannula.  Heart rate in 110s.   Assessment  & Plan :    Principal Problem: Systemic inflammatory response syndrome (SIRS) Symptomatic anemia/acute blood loss anemia Hemoglobin still 5.8 despite 2 unit PRBC.  Ordered another 2 unit PRBC.  Suspect upper GI bleed.  Patient reports taking Advil 2 mg every 4 hours scheduled for several months.   Instructed on avoiding NSAIDs.  Continue PPI drip. We will monitor H&H every 6 hours and if hemodynamically unstable will need to be transferred to stepdown unit. GI consult pending.  Keep n.p.o.  Active Problems:   Non-small cell carcinoma of right lung, stage 3 (HCC) Currently getting systemic chemotherapy with Alimta every 3 weeks and has received 4  cycles of treatment.  Notified his oncologist via epic.  Stage III sacral ulcer Wound care consult appreciated.  Nutrition consult placed.   Hypotension Secondary to blood loss anemia.  Hold blood pressure medications.  Currently stable.  Pancytopenia Secondary to chemotherapy and acute blood loss anemia.  Monitor.   Code Status : Full code  Family Communication  : Son at bedside  Disposition Plan  : Home pending hospital course  Barriers For Discharge : Active symptoms  Consults  : [facility]  Procedures  : None DVT Prophylaxis  : SCDs  Lab Results  Component Value Date   PLT 85 (L) 08/13/2018   PLT 85 (L) 08/13/2018    Antibiotics  :   Anti-infectives (From admission, onward)   None        Objective:   Vitals:   08/13/18 0930 08/13/18 1101 08/13/18 1128 08/13/18 1150  BP: (!) 102/56  97/70 104/71  Pulse: (!) 106  (!) 105 (!) 107  Resp: (!) 30  (!) 30 (!) 30  Temp: 98.4 F (36.9 C)  98.4 F (36.9 C) 98.4 F (36.9 C)  TempSrc: Oral  Oral Oral  SpO2: 100% 99% 100% 100%    Wt Readings from Last 3 Encounters:  08/04/18 43.9 kg  07/16/18 45.7 kg  06/25/18 45.3 kg     Intake/Output Summary (Last 24 hours) at 08/13/2018 1202 Last data filed at 08/13/2018 1128 Gross per 24 hour  Intake 2125.96 ml  Output 800 ml  Net 1325.96 ml     Physical Exam  Gen: not in distress HEENT: Temporal wasting, pallor+, moist mucosa, supple neck Chest: clear b/l, no added sounds CVS: S1&S2 tachycardic, no murmurs, rubs or gallop GI: soft, NT, ND, BS+ Musculoskeletal: warm, no edema     Data Review:     CBC Recent Labs  Lab 08/12/18 1643 08/12/18 1658 08/12/18 2050 08/13/18 0818  WBC 2.1*  --  2.4* 3.0*  3.0*  HGB 6.1* 5.1* 5.1* 5.8*  5.9*  HCT 20.1* 15.0* 17.4* 18.3*  18.6*  PLT 131*  --  113* 85*  85*  MCV 103.6*  --  106.1* 97.9  97.9  MCH 31.4  --  31.1 31.0  31.1  MCHC 30.3  --  29.3* 31.7  31.7  RDW 20.7*  --  20.6* 17.2*  17.2*    Chemistries  Recent Labs  Lab 08/12/18 1643 08/12/18 1658 08/13/18 0818  NA 148* 144 145  K 4.0 3.6 3.9  CL 112* 111 118*  CO2 27  --  22  GLUCOSE 120* 108* 137*  BUN 69* 68* 72*  CREATININE 1.03 0.90 0.94  CALCIUM 8.6*  --  7.3*  AST  --   --  13*  ALT  --   --  10  ALKPHOS  --   --  36*  BILITOT  --   --  0.5   ------------------------------------------------------------------------------------------------------------------ No results for input(s): CHOL, HDL, LDLCALC, TRIG, CHOLHDL, LDLDIRECT in the last 72 hours.  No results found for: HGBA1C ------------------------------------------------------------------------------------------------------------------ No results for input(s): TSH, T4TOTAL, T3FREE, THYROIDAB in the last 72 hours.  Invalid input(s): FREET3 ------------------------------------------------------------------------------------------------------------------ No results for input(s): VITAMINB12, FOLATE, FERRITIN, TIBC, IRON, RETICCTPCT in the last 72 hours.  Coagulation profile No results for input(s): INR, PROTIME in the last 168 hours.  No results for input(s): DDIMER in the last 72 hours.  Cardiac Enzymes No results for input(s): CKMB, TROPONINI, MYOGLOBIN in the last 168 hours.  Invalid input(s): CK ------------------------------------------------------------------------------------------------------------------  Component Value Date/Time   BNP 42.7 07/28/2015 0720    Inpatient Medications  Scheduled Meds: . ALPRAZolam  0.25 mg Oral QHS  . folic acid  1 mg Oral Daily  . magic  mouthwash  5 mL Oral QID  . mometasone-formoterol  2 puff Inhalation BID  . [START ON 08/16/2018] pantoprazole  40 mg Intravenous Q12H  . predniSONE  20 mg Oral BID WC   Continuous Infusions: . sodium chloride 100 mL/hr at 08/13/18 0610  . pantoprozole (PROTONIX) infusion 8 mg/hr (08/13/18 0608)   PRN Meds:.guaiFENesin, ondansetron **OR** ondansetron (ZOFRAN) IV, prochlorperazine  Micro Results No results found for this or any previous visit (from the past 240 hour(s)).  Radiology Reports Dg Chest 2 View  Result Date: 08/12/2018 CLINICAL DATA:  Increasing weakness and fatigue. EXAM: CHEST - 2 VIEW COMPARISON:  06/18/2018 CXR, chest CT 07/15/2018 FINDINGS: Confluent opacity with volume loss involving the right upper lung. Stable aerated appearance of the right lung base with compensatory hyperinflation of the left lung. Uncoiled appearance of the thoracic aorta. Top-normal heart size. Chronic atelectasis/consolidation along the posteromedial aspect of the right lung base. IMPRESSION: Chronic stable pulmonary consolidations in the right upper and lower lobes. Compensatory hyperinflation of the left lung unchanged. Electronically Signed   By: Ashley Royalty M.D.   On: 08/12/2018 17:25   Ct Chest W Contrast  Result Date: 07/16/2018 CLINICAL DATA:  Lung cancer EXAM: CT CHEST, ABDOMEN, AND PELVIS WITH CONTRAST TECHNIQUE: Multidetector CT imaging of the chest, abdomen and pelvis was performed following the standard protocol during bolus administration of intravenous contrast. CONTRAST:  22mL OMNIPAQUE IOHEXOL 300 MG/ML  SOLN COMPARISON:  04/25/2018 FINDINGS: CT CHEST FINDINGS Cardiovascular: The heart size is normal. No substantial pericardial effusion. Coronary artery calcification is evident. Atherosclerotic calcification is noted in the wall of the thoracic aorta. Mediastinum/Nodes: Similar appearance of mild mediastinal lymphadenopathy. No left hilar lymphadenopathy. Right hilum diffusely encased by  abnormal soft tissue as before. The esophagus has normal imaging features. There is no axillary lymphadenopathy. Lungs/Pleura: The central tracheobronchial airways are patent. Similar appearance of mass-effect on the right mainstem bronchus and bronchus intermedius. Continued further increase in enhancing pleural nodularity at the right apex. 1.9 x 2.9 cm lesion (24/2) on today's study appears to be in the chest wall and was 1.2 x 2.4 cm previously. Marked volume loss in the right hemithorax evident with consolidation of the right upper lobe and collapse of the right lower lobe, similar to prior. Left lung is hyperexpanded and markedly emphysematous. 14 mm subpleural lesion in the left upper lobe is stable at 14 mm. Area of probable scarring in the medial left upper lobe is stable. Architectural distortion with subtle ill-defined nodularity posterior left upper lobe (47/4) shows no substantial change. Spiculated left lower lobe nodule is stable to minimally decreased measuring 6 x 6 mm today compared to 6 x 7 mm previously. Areas of architectural distortion/nodularity at the left base are similar although a medial nodule (138/4) measures 8 mm today is new in the interval. This probably represents an area of atypical infection. 4 mm cavitary nodule posterior left lower lobe (136/4) is new in the interval. Musculoskeletal: Lesion in the right T11 vertebral body appears progressive in the interval and abnormal soft tissue in the right paraspinal space at T11-12 is similar with neural foraminal involvement. CT ABDOMEN PELVIS FINDINGS Hepatobiliary: No focal abnormality within the liver parenchyma. There is no evidence for gallstones, gallbladder wall thickening, or pericholecystic fluid. Mild biliary prominence  without overt biliary duct dilatation. Pancreas: No focal mass lesion. No dilatation of the main duct. No intraparenchymal cyst. No peripancreatic edema. Spleen: No splenomegaly. No focal mass lesion.  Adrenals/Urinary Tract: No adrenal nodule or mass. Similar appearance of differential perfusion right upper renal pole, nonspecific given persistence. Nonobstructing stones are seen in the upper pole the right kidney. Left kidney unremarkable. No evidence for hydroureter. The urinary bladder appears normal for the degree of distention. Stomach/Bowel: Stomach is markedly distended no obstructing mass evident. Duodenum is normally positioned as is the ligament of Treitz. No small bowel wall thickening. No small bowel dilatation. Terminal ileum not well seen. The appendix is not visualized, but there is no edema or inflammation in the region of the cecum. Colon is decompressed. Vascular/Lymphatic: There is abdominal aortic atherosclerosis without aneurysm. There is no gastrohepatic or hepatoduodenal ligament lymphadenopathy. No intraperitoneal or retroperitoneal lymphadenopathy. No pelvic sidewall lymphadenopathy. Reproductive: Prostate gland is markedly enlarged. Other: Small volume intraperitoneal free fluid wall edema. There is diffuse mesenteric and body Musculoskeletal: Right groin hernia contains bowel loops without complicating features. Scattered bony metastases again noted. Left sacral lesion is more conspicuous today. Similar appearance of the L1 lesion. IMPRESSION: 1. Progression of disease in the right upper hemithorax with more prominent pleural nodularity and chest wall disease. 2. Stable collapse right lower lobe. 3. Similar appearance multiple left lung nodules with new clustered nodularity posterior left lower lobe, likely related to infection/inflammation although aspiration could have this appearance. 4. Progression of metastatic involvement at T11 with similar appearance of the extraosseous paraspinal disease at T11-12. Remaining bony metastatic involvement is similar. 5. Stomach is markedly distended and fluid-filled. No obstructing lesion evident. 6.  Aortic Atherosclerois (ICD10-170.0)  Electronically Signed   By: Misty Stanley M.D.   On: 07/16/2018 09:54   Ct Abdomen Pelvis W Contrast  Result Date: 07/16/2018 CLINICAL DATA:  Lung cancer EXAM: CT CHEST, ABDOMEN, AND PELVIS WITH CONTRAST TECHNIQUE: Multidetector CT imaging of the chest, abdomen and pelvis was performed following the standard protocol during bolus administration of intravenous contrast. CONTRAST:  70mL OMNIPAQUE IOHEXOL 300 MG/ML  SOLN COMPARISON:  04/25/2018 FINDINGS: CT CHEST FINDINGS Cardiovascular: The heart size is normal. No substantial pericardial effusion. Coronary artery calcification is evident. Atherosclerotic calcification is noted in the wall of the thoracic aorta. Mediastinum/Nodes: Similar appearance of mild mediastinal lymphadenopathy. No left hilar lymphadenopathy. Right hilum diffusely encased by abnormal soft tissue as before. The esophagus has normal imaging features. There is no axillary lymphadenopathy. Lungs/Pleura: The central tracheobronchial airways are patent. Similar appearance of mass-effect on the right mainstem bronchus and bronchus intermedius. Continued further increase in enhancing pleural nodularity at the right apex. 1.9 x 2.9 cm lesion (24/2) on today's study appears to be in the chest wall and was 1.2 x 2.4 cm previously. Marked volume loss in the right hemithorax evident with consolidation of the right upper lobe and collapse of the right lower lobe, similar to prior. Left lung is hyperexpanded and markedly emphysematous. 14 mm subpleural lesion in the left upper lobe is stable at 14 mm. Area of probable scarring in the medial left upper lobe is stable. Architectural distortion with subtle ill-defined nodularity posterior left upper lobe (47/4) shows no substantial change. Spiculated left lower lobe nodule is stable to minimally decreased measuring 6 x 6 mm today compared to 6 x 7 mm previously. Areas of architectural distortion/nodularity at the left base are similar although a medial  nodule (138/4) measures 8 mm today is new in  the interval. This probably represents an area of atypical infection. 4 mm cavitary nodule posterior left lower lobe (136/4) is new in the interval. Musculoskeletal: Lesion in the right T11 vertebral body appears progressive in the interval and abnormal soft tissue in the right paraspinal space at T11-12 is similar with neural foraminal involvement. CT ABDOMEN PELVIS FINDINGS Hepatobiliary: No focal abnormality within the liver parenchyma. There is no evidence for gallstones, gallbladder wall thickening, or pericholecystic fluid. Mild biliary prominence without overt biliary duct dilatation. Pancreas: No focal mass lesion. No dilatation of the main duct. No intraparenchymal cyst. No peripancreatic edema. Spleen: No splenomegaly. No focal mass lesion. Adrenals/Urinary Tract: No adrenal nodule or mass. Similar appearance of differential perfusion right upper renal pole, nonspecific given persistence. Nonobstructing stones are seen in the upper pole the right kidney. Left kidney unremarkable. No evidence for hydroureter. The urinary bladder appears normal for the degree of distention. Stomach/Bowel: Stomach is markedly distended no obstructing mass evident. Duodenum is normally positioned as is the ligament of Treitz. No small bowel wall thickening. No small bowel dilatation. Terminal ileum not well seen. The appendix is not visualized, but there is no edema or inflammation in the region of the cecum. Colon is decompressed. Vascular/Lymphatic: There is abdominal aortic atherosclerosis without aneurysm. There is no gastrohepatic or hepatoduodenal ligament lymphadenopathy. No intraperitoneal or retroperitoneal lymphadenopathy. No pelvic sidewall lymphadenopathy. Reproductive: Prostate gland is markedly enlarged. Other: Small volume intraperitoneal free fluid wall edema. There is diffuse mesenteric and body Musculoskeletal: Right groin hernia contains bowel loops without  complicating features. Scattered bony metastases again noted. Left sacral lesion is more conspicuous today. Similar appearance of the L1 lesion. IMPRESSION: 1. Progression of disease in the right upper hemithorax with more prominent pleural nodularity and chest wall disease. 2. Stable collapse right lower lobe. 3. Similar appearance multiple left lung nodules with new clustered nodularity posterior left lower lobe, likely related to infection/inflammation although aspiration could have this appearance. 4. Progression of metastatic involvement at T11 with similar appearance of the extraosseous paraspinal disease at T11-12. Remaining bony metastatic involvement is similar. 5. Stomach is markedly distended and fluid-filled. No obstructing lesion evident. 6.  Aortic Atherosclerois (ICD10-170.0) Electronically Signed   By: Misty Stanley M.D.   On: 07/16/2018 09:54    Time Spent in minutes  35   Clide Remmers M.D on 08/13/2018 at 12:02 PM  Between 7am to 7pm - Pager - [phone]  After 7pm go to [email or web address] - password [facility]  [facility] -  Office  [phone]

## 2018-08-13 NOTE — Progress Notes (Signed)
Patient very short of breath, RR 33-36, HR 110, 2 units of blood given and BP has dropped to 81/55. Patient is having frequent large stools with clots that are bright red/black in color. MD notified and new orders were given including transfer to stepdown unit for closer monitoring.

## 2018-08-13 NOTE — Progress Notes (Signed)
Patient remained tachycardic in 110s and tachypneic.  Blood pressure dropped to 80 systolic this afternoon and having melanotic stools.  Has already received 4 units PRBC.  IV fluid bolus ordered.  Will transfer to stepdown unit for close monitoring.

## 2018-08-13 NOTE — Progress Notes (Signed)
CRITICAL VALUE ALERT  Critical Value:  Hgb 5.8  Date & Time Notied:    Provider Notified: Dhungal @ [phone]  Orders Received/Actions taken: 2 unit PRBC due

## 2018-08-13 NOTE — H&P (View-Only) (Signed)
[facility] Gastroenterology Consultation Note  Referring Provider: Dr. Clementeen Graham [facility]) Primary Care Physician:  Aura Dials, MD  Reason for Consultation:  Melena, anemia  HPI: Gabriel [name] is a 77 y.o. male in midst of chemotherapy and radiation for lung cancer.  Has COPD and chronic shortness of breath, which became worse over the past several days.  Has had melena today; no prior bleeding.  Takes NSAIDs periodically.  No hematemesis.  No prior endoscopy.  Colonoscopy years ago, doesn't recall results.  Has recently started iron pills for anemia.  No prior GI bleeding to his recollection.   Past Medical History:  Diagnosis Date  . Cancer ([place])    skin cancer  . Constipation    AFTER ANESTHESIA  . Difficulty sleeping   . History of nonmelanoma skin cancer   . Hypertension 10/30/2016  . Inguinal hernia   . Lung cancer ([place])   . Lung mass    right / HAS HAD RADIATION AND CHEMO FOR LUNG CANCER  . Pneumothorax on right 08/24/2015  . Productive cough    "DUE TO RADIATION"  . Radiation 05/11/15-06/21/15   NSCLCA/right lung  . Smoking 1/2 pack a day or less 09/17/2016  . Spontaneous pneumothorax MANY YRS AGO AND AGAIN 04/04/15   HISTORY OF (ONLY)  . Weight loss 04/01/2017    Past Surgical History:  Procedure Laterality Date  . CHEST TUBE INSERTION Right 08/02/2015   Procedure: INSERTION OF SECOND RIGHT CHEST TUBE WITH FLUROSCOPY;  Surgeon: Grace Isaac, MD;  Location: [place];  Service: Thoracic;  Laterality: Right;  . CHEST TUBE INSERTION  07/28/2015  . COLONOSCOPY W/ BIOPSIES AND POLYPECTOMY    . HERNIA REPAIR  2011  . INGUINAL HERNIA REPAIR Left 07/15/2015   Procedure: OPEN LEFT INGUINAL HERNIA REPAIR;  Surgeon: Johnathan Hausen, MD;  Location: [facility];  Service: General;  Laterality: Left;  With MESH  . TONSILLECTOMY    . VIDEO BRONCHOSCOPY WITH ENDOBRONCHIAL ULTRASOUND N/A 04/18/2015   Procedure: VIDEO BRONCHOSCOPY WITH ENDOBRONCHIAL ULTRASOUND;  Surgeon: Grace Isaac, MD;   Location: [place];  Service: Thoracic;  Laterality: N/A;  . VIDEO BRONCHOSCOPY WITH INSERTION OF INTERBRONCHIAL VALVE (IBV) N/A 08/02/2015   Procedure: VIDEO BRONCHOSCOPY WITH ATTEMPTED INSERTION OF INTERBRONCHIAL VALVE (IBV) WITH FLUROSCOPY;  Surgeon: Grace Isaac, MD;  Location: [place];  Service: Thoracic;  Laterality: N/A;  . WISDOM TOOTH EXTRACTION      Prior to Admission medications   Medication Sig Start Date End Date Taking? Authorizing Provider  ALPRAZolam (XANAX) 0.25 MG tablet Take 1 tablet (0.25 mg total) by mouth at bedtime. 08/04/18  Yes Curt Bears, MD  dexamethasone (DECADRON) 4 MG tablet Take 4 mg twice a day the day before, day of and day after chemo 08/04/18  Yes Curt Bears, MD  Diphenhyd-Hydrocort-Nystatin (FIRST-DUKES MOUTHWASH) SUSP Take 5 mLs by mouth QID. 08/08/18  Yes Brand Males, MD  folic acid (FOLVITE) 1 MG tablet Take 1 tablet (1 mg total) by mouth daily. 04/28/18  Yes Curt Bears, MD  guaiFENesin (MUCINEX) 600 MG 12 hr tablet Take 1 tablet (600 mg total) by mouth 2 (two) times daily as needed. Patient taking differently: Take 1,200 mg by mouth 2 (two) times daily as needed for cough or to loosen phlegm.  09/14/15  Yes Lars Pinks M, PA-C  ibuprofen (ADVIL,MOTRIN) 200 MG tablet Take 400 mg by mouth every 4 (four) hours as needed for moderate pain.    Yes [provider]  oxyCODONE-acetaminophen (PERCOCET/ROXICET) 5-325 MG  tablet Take 1 tablet by mouth every 8 (eight) hours as needed for severe pain. 07/16/18  Yes Curcio, Roselie Awkward, NP  predniSONE (DELTASONE) 10 MG tablet TAKE 1 TABLET BY MOUTH TWICE A DAY 07/25/18  Yes Curt Bears, MD  prochlorperazine (COMPAZINE) 10 MG tablet Take 1 tablet (10 mg total) by mouth every 6 (six) hours as needed for nausea or vomiting. 04/28/18  Yes Curt Bears, MD  SYMBICORT 160-4.5 MCG/ACT inhaler INHALE 2 PUFFS TWICE DAILY 03/31/18  Yes Brand Males, MD  B Complex Vitamins (B COMPLEX PO) Take  1 capsule by mouth daily.     [provider]  dronabinol (MARINOL) 2.5 MG capsule Take 1 capsule (2.5 mg total) by mouth 2 (two) times daily before a meal. Patient not taking: Reported on 07/16/2018 03/03/18   Curt Bears, MD  methylphenidate (RITALIN) 5 MG tablet Take 1 tablet a day. Patient not taking: Reported on 07/16/2018 06/04/18   Maryanna Shape, NP  ondansetron (ZOFRAN) 8 MG tablet Take 1 tablet (8 mg total) by mouth every 8 (eight) hours as needed for nausea or vomiting. Patient not taking: Reported on 08/12/2018 07/16/18   Maryanna Shape, NP  temazepam (RESTORIL) 15 MG capsule Take 1 capsule (15 mg total) by mouth at bedtime as needed for sleep. Patient not taking: Reported on 07/16/2018 04/28/18   Curt Bears, MD    Current Facility-Administered Medications  Medication Dose Route Frequency Provider Last Rate Last Dose  . 0.9 %  sodium chloride infusion   Intravenous Continuous Elwyn Reach, MD 100 mL/hr at 08/13/18 0610    . ALPRAZolam Duanne Moron) tablet 0.25 mg  0.25 mg Oral QHS Gala Romney L, MD   0.25 mg at 08/12/18 2146  . folic acid (FOLVITE) tablet 1 mg  1 mg Oral Daily Elwyn Reach, MD   Stopped at 08/13/18 [phone]  . guaiFENesin (MUCINEX) 12 hr tablet 1,200 mg  1,200 mg Oral BID PRN Elwyn Reach, MD      . magic mouthwash  5 mL Oral QID Elwyn Reach, MD   5 mL at 08/12/18 2145  . mometasone-formoterol (DULERA) 200-5 MCG/ACT inhaler 2 puff  2 puff Inhalation BID Elwyn Reach, MD   2 puff at 08/13/18 1101  . ondansetron (ZOFRAN) tablet 4 mg  4 mg Oral Q6H PRN Elwyn Reach, MD       Or  . ondansetron (ZOFRAN) injection 4 mg  4 mg Intravenous Q6H PRN Gala Romney L, MD      . pantoprazole (PROTONIX) 80 mg in sodium chloride 0.9 % 250 mL (0.32 mg/mL) infusion  8 mg/hr Intravenous Continuous Elwyn Reach, MD 25 mL/hr at 08/13/18 0608 8 mg/hr at 08/13/18 0608  . [START ON 08/16/2018] pantoprazole (PROTONIX) injection 40 mg  40 mg  Intravenous Q12H Garba, Mohammad L, MD      . predniSONE (DELTASONE) tablet 20 mg  20 mg Oral BID WC Elwyn Reach, MD   Stopped at 08/13/18 [phone]  . prochlorperazine (COMPAZINE) tablet 10 mg  10 mg Oral Q6H PRN Elwyn Reach, MD        Allergies as of 08/12/2018 - Review Complete 08/12/2018  Allergen Reaction Noted  . Glycopyrrolate Other (See Comments) 07/16/2018  . Aleve [naproxen] Rash 10/05/2013    Family History  Problem Relation Age of Onset  . Heart attack Father   . Heart disease Father   . Hypertension Mother     Social History   Socioeconomic  History  . Marital status: Divorced    Spouse name: Not on file  . Number of children: Not on file  . Years of education: Not on file  . Highest education level: Not on file  Occupational History  . Occupation: Lobbyist: [employer]  . Financial resource strain: Not on file  . Food insecurity:    Worry: Not on file    Inability: Not on file  . Transportation needs:    Medical: Not on file    Non-medical: Not on file  Tobacco Use  . Smoking status: Current Every Day Smoker    Packs/day: 0.25    Years: 55.00    Pack years: 13.75    Types: Cigarettes  . Smokeless tobacco: Never Used  . Tobacco comment: 1/2 ppd 7.25.18 ee  Substance and Sexual Activity  . Alcohol use: Yes    Alcohol/week: 0.0 standard drinks    Comment: every other night -social  . Drug use: No  . Sexual activity: Not Currently  Lifestyle  . Physical activity:    Days per week: Not on file    Minutes per session: Not on file  . Stress: Not on file  Relationships  . Social connections:    Talks on phone: Not on file    Gets together: Not on file    Attends religious service: Not on file    Active member of club or organization: Not on file    Attends meetings of clubs or organizations: Not on file    Relationship status: Not on file  . Intimate partner violence:    Fear of current or ex partner: Not on  file    Emotionally abused: Not on file    Physically abused: Not on file    Forced sexual activity: Not on file  Other Topics Concern  . Not on file  Social History Narrative  . Not on file    Review of Systems: As per HPI, all others negative  Physical Exam: Vital signs in last 24 hours: Temp:  [97.5 F (36.4 C)-98.9 F (37.2 C)] 98.9 F (37.2 C) (10/16 1209) Pulse Rate:  [73-115] 105 (10/16 1209) Resp:  [19-36] 28 (10/16 1209) BP: (82-136)/(51-75) 116/69 (10/16 1209) SpO2:  [95 %-100 %] 100 % (10/16 1209) Last BM Date: 08/13/18 General:   Thin, frail, cachectic, tachypneic, chronically ill-appearing Head:  Normocephalic and atraumatic. Eyes:  Sclera clear, no icterus.   Conjunctiva pale Ears:  Normal auditory acuity. Nose:  No deformity, discharge,  or lesions. Mouth:  No deformity or lesions.  Oropharynx pink & moist. Neck:  Supple; no masses or thyromegaly. Lungs:  Poor aeration throughout with upper airway sounds Heart:  Regular rate and rhythm; no murmurs, clicks, rubs,  or gallops. Abdomen:  Thin, soft, nontender and nondistended. No masses, hepatosplenomegaly or hernias noted. Normal bowel sounds, without guarding, and without rebound.     Msk:  Diffuse atrophy, symmetrical without gross deformities. Normal posture. Pulses:  Normal pulses noted. Extremities:  Without clubbing or edema. Neurologic:  Alert and  oriented x4;  grossly normal neurologically. Skin:  Scattered ecchymoses and telangiectasias; has sacral decubitus; otherwise Intact without significant lesions or rashes. Psych:  Alert and cooperative. Normal mood and affect.   Lab Results: Recent Labs    08/12/18 1643 08/12/18 1658 08/12/18 2050 08/13/18 0818  WBC 2.1*  --  2.4* 3.0*  3.0*  HGB 6.1* 5.1* 5.1* 5.8*  5.9*  HCT 20.1*  15.0* 17.4* 18.3*  18.6*  PLT 131*  --  113* 85*  85*   BMET Recent Labs    08/12/18 1643 08/12/18 1658 08/13/18 0818  NA 148* 144 145  K 4.0 3.6 3.9  CL 112*  111 118*  CO2 27  --  22  GLUCOSE 120* 108* 137*  BUN 69* 68* 72*  CREATININE 1.03 0.90 0.94  CALCIUM 8.6*  --  7.3*   LFT Recent Labs    08/13/18 0818  PROT 3.8*  ALBUMIN 2.0*  AST 13*  ALT 10  ALKPHOS 36*  BILITOT 0.5   PT/INR No results for input(s): LABPROT, INR in the last 72 hours.  Studies/Results: Dg Chest 2 View  Result Date: 08/12/2018 CLINICAL DATA:  Increasing weakness and fatigue. EXAM: CHEST - 2 VIEW COMPARISON:  06/18/2018 CXR, chest CT 07/15/2018 FINDINGS: Confluent opacity with volume loss involving the right upper lung. Stable aerated appearance of the right lung base with compensatory hyperinflation of the left lung. Uncoiled appearance of the thoracic aorta. Top-normal heart size. Chronic atelectasis/consolidation along the posteromedial aspect of the right lung base. IMPRESSION: Chronic stable pulmonary consolidations in the right upper and lower lobes. Compensatory hyperinflation of the left lung unchanged. Electronically Signed   By: Ashley Royalty M.D.   On: 08/12/2018 17:25   Impression:  1.  Melena. 2.  Progressive blood loss anemia (on top of chemotherapy-induced pancytopenia). 3.  COPD. 4.  Lung cancer, last chemotherapy couple weeks ago.  Plan:  1.  Patient is in no shape for endoscopy right now, in setting of his significant acute worsening of already tenuous shortness of breath. 2.  Advise PPI, serial CBCs, blood transfusion as needed, needs Hgb at least to 8.  In plts in morning less than 50, will need platelet transfusion pre-EGD as well. 3.  Tentatively plan for endoscopy tomorrow, pending how his clinical status is on rounds in the morning. 4.  Case discussed with Dr. Clementeen Graham of [facility]. 5.  [facility] will follow.   LOS: 1 day   Tate Zagal M  08/13/2018, 1:24 PM  Cell [phone] If no answer or after 5 PM call [phone]

## 2018-08-13 NOTE — Consult Note (Signed)
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 Dressing procedure/placement/frequency: Pt is extremely SOB with any movement. Patient can only tolerate turning to left side. Large area of sacrum with stage 1, pt is very thin and spine and coccyx bones are very prominent. I educated pt and son on need to keep all pressure off this area. This area could easily advance to open wound. Sacral foam placed for protection and pt is to only lie supine for meals, otherwise, side to side lying. Pt states he has a good pad for the chair when he is up so I will not order one at this time, both ischial areas look unremarkable. Pt is obviously malnourished, recommend a nutritional consult for optimal wound healing and prevention, please order if you agree. We will not follow, but will remain available to this patient, to nursing, and the medical and/or surgical teams.  Please re-consult if we need to assist further.  Fara Olden, RN-C, WTA-C, [facility]

## 2018-08-13 NOTE — Progress Notes (Signed)
MD made aware that patient's follow up CBC results were back and that patient's hgb was at 6.4 now (after the 2 units PRBC). Patient was receiving a 1L fluid bolus at the time that labs were drawn. MD also made aware that patient's BP is slightly improving. Verbal orders received to recheck Hgb in 2 hours. If Hgb is still less than 7, contact on-call provider for an additional unit of blood.   Will continue to monitor.

## 2018-08-13 NOTE — Anesthesia Preprocedure Evaluation (Addendum)
Anesthesia Evaluation  Patient identified by MRN, date of birth, ID band Patient awake    Reviewed: Allergy & Precautions, NPO status , Patient's Chart, lab work & pertinent test results  Airway Mallampati: II  TM Distance: >3 FB Neck ROM: Full    Dental no notable dental hx. (+) Teeth Intact, Dental Advisory Given   Pulmonary COPD, Current Smoker,    Pulmonary exam normal breath sounds clear to auscultation       Cardiovascular hypertension, Pt. on medications Normal cardiovascular exam Rhythm:Regular Rate:Normal     Neuro/Psych negative neurological ROS     GI/Hepatic Neg liver ROS, GERD  Medicated,  Endo/Other  negative endocrine ROS  Renal/GU negative Renal ROS     Musculoskeletal negative musculoskeletal ROS (+)   Abdominal   Peds  Hematology negative hematology ROS (+) anemia ,   Anesthesia Other Findings   Reproductive/Obstetrics                            Anesthesia Physical Anesthesia Plan  ASA: IV  Anesthesia Plan: MAC   Post-op Pain Management:    Induction:   PONV Risk Score and Plan: Treatment may vary due to age or medical condition  Airway Management Planned: Natural Airway and Nasal Cannula  Additional Equipment:   Intra-op Plan:   Post-operative Plan:   Informed Consent: I have reviewed the patients History and Physical, chart, labs and discussed the procedure including the risks, benefits and alternatives for the proposed anesthesia with the patient or authorized representative who has indicated his/her understanding and acceptance.   Dental advisory given  Plan Discussed with:   Anesthesia Plan Comments:         Anesthesia Quick Evaluation

## 2018-08-13 NOTE — Consult Note (Signed)
[facility] Gastroenterology Consultation Note  Referring Provider: Dr. Clementeen Graham [facility]) Primary Care Physician:  Aura Dials, MD  Reason for Consultation:  Melena, anemia  HPI: Gabriel [name] is a 77 y.o. male in midst of chemotherapy and radiation for lung cancer.  Has COPD and chronic shortness of breath, which became worse over the past several days.  Has had melena today; no prior bleeding.  Takes NSAIDs periodically.  No hematemesis.  No prior endoscopy.  Colonoscopy years ago, doesn't recall results.  Has recently started iron pills for anemia.  No prior GI bleeding to his recollection.   Past Medical History:  Diagnosis Date  . Cancer ([place])    skin cancer  . Constipation    AFTER ANESTHESIA  . Difficulty sleeping   . History of nonmelanoma skin cancer   . Hypertension 10/30/2016  . Inguinal hernia   . Lung cancer ([place])   . Lung mass    right / HAS HAD RADIATION AND CHEMO FOR LUNG CANCER  . Pneumothorax on right 08/24/2015  . Productive cough    "DUE TO RADIATION"  . Radiation 05/11/15-06/21/15   NSCLCA/right lung  . Smoking 1/2 pack a day or less 09/17/2016  . Spontaneous pneumothorax MANY YRS AGO AND AGAIN 04/04/15   HISTORY OF (ONLY)  . Weight loss 04/01/2017    Past Surgical History:  Procedure Laterality Date  . CHEST TUBE INSERTION Right 08/02/2015   Procedure: INSERTION OF SECOND RIGHT CHEST TUBE WITH FLUROSCOPY;  Surgeon: Grace Isaac, MD;  Location: [place];  Service: Thoracic;  Laterality: Right;  . CHEST TUBE INSERTION  07/28/2015  . COLONOSCOPY W/ BIOPSIES AND POLYPECTOMY    . HERNIA REPAIR  2011  . INGUINAL HERNIA REPAIR Left 07/15/2015   Procedure: OPEN LEFT INGUINAL HERNIA REPAIR;  Surgeon: Johnathan Hausen, MD;  Location: [facility];  Service: General;  Laterality: Left;  With MESH  . TONSILLECTOMY    . VIDEO BRONCHOSCOPY WITH ENDOBRONCHIAL ULTRASOUND N/A 04/18/2015   Procedure: VIDEO BRONCHOSCOPY WITH ENDOBRONCHIAL ULTRASOUND;  Surgeon: Grace Isaac, MD;   Location: [place];  Service: Thoracic;  Laterality: N/A;  . VIDEO BRONCHOSCOPY WITH INSERTION OF INTERBRONCHIAL VALVE (IBV) N/A 08/02/2015   Procedure: VIDEO BRONCHOSCOPY WITH ATTEMPTED INSERTION OF INTERBRONCHIAL VALVE (IBV) WITH FLUROSCOPY;  Surgeon: Grace Isaac, MD;  Location: [place];  Service: Thoracic;  Laterality: N/A;  . WISDOM TOOTH EXTRACTION      Prior to Admission medications   Medication Sig Start Date End Date Taking? Authorizing Provider  ALPRAZolam (XANAX) 0.25 MG tablet Take 1 tablet (0.25 mg total) by mouth at bedtime. 08/04/18  Yes Curt Bears, MD  dexamethasone (DECADRON) 4 MG tablet Take 4 mg twice a day the day before, day of and day after chemo 08/04/18  Yes Curt Bears, MD  Diphenhyd-Hydrocort-Nystatin (FIRST-DUKES MOUTHWASH) SUSP Take 5 mLs by mouth QID. 08/08/18  Yes Brand Males, MD  folic acid (FOLVITE) 1 MG tablet Take 1 tablet (1 mg total) by mouth daily. 04/28/18  Yes Curt Bears, MD  guaiFENesin (MUCINEX) 600 MG 12 hr tablet Take 1 tablet (600 mg total) by mouth 2 (two) times daily as needed. Patient taking differently: Take 1,200 mg by mouth 2 (two) times daily as needed for cough or to loosen phlegm.  09/14/15  Yes Lars Pinks M, PA-C  ibuprofen (ADVIL,MOTRIN) 200 MG tablet Take 400 mg by mouth every 4 (four) hours as needed for moderate pain.    Yes [provider]  oxyCODONE-acetaminophen (PERCOCET/ROXICET) 5-325 MG  tablet Take 1 tablet by mouth every 8 (eight) hours as needed for severe pain. 07/16/18  Yes Curcio, Roselie Awkward, NP  predniSONE (DELTASONE) 10 MG tablet TAKE 1 TABLET BY MOUTH TWICE A DAY 07/25/18  Yes Curt Bears, MD  prochlorperazine (COMPAZINE) 10 MG tablet Take 1 tablet (10 mg total) by mouth every 6 (six) hours as needed for nausea or vomiting. 04/28/18  Yes Curt Bears, MD  SYMBICORT 160-4.5 MCG/ACT inhaler INHALE 2 PUFFS TWICE DAILY 03/31/18  Yes Brand Males, MD  B Complex Vitamins (B COMPLEX PO) Take  1 capsule by mouth daily.     [provider]  dronabinol (MARINOL) 2.5 MG capsule Take 1 capsule (2.5 mg total) by mouth 2 (two) times daily before a meal. Patient not taking: Reported on 07/16/2018 03/03/18   Curt Bears, MD  methylphenidate (RITALIN) 5 MG tablet Take 1 tablet a day. Patient not taking: Reported on 07/16/2018 06/04/18   Maryanna Shape, NP  ondansetron (ZOFRAN) 8 MG tablet Take 1 tablet (8 mg total) by mouth every 8 (eight) hours as needed for nausea or vomiting. Patient not taking: Reported on 08/12/2018 07/16/18   Maryanna Shape, NP  temazepam (RESTORIL) 15 MG capsule Take 1 capsule (15 mg total) by mouth at bedtime as needed for sleep. Patient not taking: Reported on 07/16/2018 04/28/18   Curt Bears, MD    Current Facility-Administered Medications  Medication Dose Route Frequency Provider Last Rate Last Dose  . 0.9 %  sodium chloride infusion   Intravenous Continuous Elwyn Reach, MD 100 mL/hr at 08/13/18 0610    . ALPRAZolam Duanne Moron) tablet 0.25 mg  0.25 mg Oral QHS Gala Romney L, MD   0.25 mg at 08/12/18 2146  . folic acid (FOLVITE) tablet 1 mg  1 mg Oral Daily Elwyn Reach, MD   Stopped at 08/13/18 [phone]  . guaiFENesin (MUCINEX) 12 hr tablet 1,200 mg  1,200 mg Oral BID PRN Elwyn Reach, MD      . magic mouthwash  5 mL Oral QID Elwyn Reach, MD   5 mL at 08/12/18 2145  . mometasone-formoterol (DULERA) 200-5 MCG/ACT inhaler 2 puff  2 puff Inhalation BID Elwyn Reach, MD   2 puff at 08/13/18 1101  . ondansetron (ZOFRAN) tablet 4 mg  4 mg Oral Q6H PRN Elwyn Reach, MD       Or  . ondansetron (ZOFRAN) injection 4 mg  4 mg Intravenous Q6H PRN Gala Romney L, MD      . pantoprazole (PROTONIX) 80 mg in sodium chloride 0.9 % 250 mL (0.32 mg/mL) infusion  8 mg/hr Intravenous Continuous Elwyn Reach, MD 25 mL/hr at 08/13/18 0608 8 mg/hr at 08/13/18 0608  . [START ON 08/16/2018] pantoprazole (PROTONIX) injection 40 mg  40 mg  Intravenous Q12H Garba, Mohammad L, MD      . predniSONE (DELTASONE) tablet 20 mg  20 mg Oral BID WC Elwyn Reach, MD   Stopped at 08/13/18 [phone]  . prochlorperazine (COMPAZINE) tablet 10 mg  10 mg Oral Q6H PRN Elwyn Reach, MD        Allergies as of 08/12/2018 - Review Complete 08/12/2018  Allergen Reaction Noted  . Glycopyrrolate Other (See Comments) 07/16/2018  . Aleve [naproxen] Rash 10/05/2013    Family History  Problem Relation Age of Onset  . Heart attack Father   . Heart disease Father   . Hypertension Mother     Social History   Socioeconomic  History  . Marital status: Divorced    Spouse name: Not on file  . Number of children: Not on file  . Years of education: Not on file  . Highest education level: Not on file  Occupational History  . Occupation: Lobbyist: [employer]  . Financial resource strain: Not on file  . Food insecurity:    Worry: Not on file    Inability: Not on file  . Transportation needs:    Medical: Not on file    Non-medical: Not on file  Tobacco Use  . Smoking status: Current Every Day Smoker    Packs/day: 0.25    Years: 55.00    Pack years: 13.75    Types: Cigarettes  . Smokeless tobacco: Never Used  . Tobacco comment: 1/2 ppd 7.25.18 ee  Substance and Sexual Activity  . Alcohol use: Yes    Alcohol/week: 0.0 standard drinks    Comment: every other night -social  . Drug use: No  . Sexual activity: Not Currently  Lifestyle  . Physical activity:    Days per week: Not on file    Minutes per session: Not on file  . Stress: Not on file  Relationships  . Social connections:    Talks on phone: Not on file    Gets together: Not on file    Attends religious service: Not on file    Active member of club or organization: Not on file    Attends meetings of clubs or organizations: Not on file    Relationship status: Not on file  . Intimate partner violence:    Fear of current or ex partner: Not on  file    Emotionally abused: Not on file    Physically abused: Not on file    Forced sexual activity: Not on file  Other Topics Concern  . Not on file  Social History Narrative  . Not on file    Review of Systems: As per HPI, all others negative  Physical Exam: Vital signs in last 24 hours: Temp:  [97.5 F (36.4 C)-98.9 F (37.2 C)] 98.9 F (37.2 C) (10/16 1209) Pulse Rate:  [73-115] 105 (10/16 1209) Resp:  [19-36] 28 (10/16 1209) BP: (82-136)/(51-75) 116/69 (10/16 1209) SpO2:  [95 %-100 %] 100 % (10/16 1209) Last BM Date: 08/13/18 General:   Thin, frail, cachectic, tachypneic, chronically ill-appearing Head:  Normocephalic and atraumatic. Eyes:  Sclera clear, no icterus.   Conjunctiva pale Ears:  Normal auditory acuity. Nose:  No deformity, discharge,  or lesions. Mouth:  No deformity or lesions.  Oropharynx pink & moist. Neck:  Supple; no masses or thyromegaly. Lungs:  Poor aeration throughout with upper airway sounds Heart:  Regular rate and rhythm; no murmurs, clicks, rubs,  or gallops. Abdomen:  Thin, soft, nontender and nondistended. No masses, hepatosplenomegaly or hernias noted. Normal bowel sounds, without guarding, and without rebound.     Msk:  Diffuse atrophy, symmetrical without gross deformities. Normal posture. Pulses:  Normal pulses noted. Extremities:  Without clubbing or edema. Neurologic:  Alert and  oriented x4;  grossly normal neurologically. Skin:  Scattered ecchymoses and telangiectasias; has sacral decubitus; otherwise Intact without significant lesions or rashes. Psych:  Alert and cooperative. Normal mood and affect.   Lab Results: Recent Labs    08/12/18 1643 08/12/18 1658 08/12/18 2050 08/13/18 0818  WBC 2.1*  --  2.4* 3.0*  3.0*  HGB 6.1* 5.1* 5.1* 5.8*  5.9*  HCT 20.1*  15.0* 17.4* 18.3*  18.6*  PLT 131*  --  113* 85*  85*   BMET Recent Labs    08/12/18 1643 08/12/18 1658 08/13/18 0818  NA 148* 144 145  K 4.0 3.6 3.9  CL 112*  111 118*  CO2 27  --  22  GLUCOSE 120* 108* 137*  BUN 69* 68* 72*  CREATININE 1.03 0.90 0.94  CALCIUM 8.6*  --  7.3*   LFT Recent Labs    08/13/18 0818  PROT 3.8*  ALBUMIN 2.0*  AST 13*  ALT 10  ALKPHOS 36*  BILITOT 0.5   PT/INR No results for input(s): LABPROT, INR in the last 72 hours.  Studies/Results: Dg Chest 2 View  Result Date: 08/12/2018 CLINICAL DATA:  Increasing weakness and fatigue. EXAM: CHEST - 2 VIEW COMPARISON:  06/18/2018 CXR, chest CT 07/15/2018 FINDINGS: Confluent opacity with volume loss involving the right upper lung. Stable aerated appearance of the right lung base with compensatory hyperinflation of the left lung. Uncoiled appearance of the thoracic aorta. Top-normal heart size. Chronic atelectasis/consolidation along the posteromedial aspect of the right lung base. IMPRESSION: Chronic stable pulmonary consolidations in the right upper and lower lobes. Compensatory hyperinflation of the left lung unchanged. Electronically Signed   By: Ashley Royalty M.D.   On: 08/12/2018 17:25   Impression:  1.  Melena. 2.  Progressive blood loss anemia (on top of chemotherapy-induced pancytopenia). 3.  COPD. 4.  Lung cancer, last chemotherapy couple weeks ago.  Plan:  1.  Patient is in no shape for endoscopy right now, in setting of his significant acute worsening of already tenuous shortness of breath. 2.  Advise PPI, serial CBCs, blood transfusion as needed, needs Hgb at least to 8.  In plts in morning less than 50, will need platelet transfusion pre-EGD as well. 3.  Tentatively plan for endoscopy tomorrow, pending how his clinical status is on rounds in the morning. 4.  Case discussed with Dr. Clementeen Graham of [facility]. 5.  [facility] will follow.   LOS: 1 day   Merrily Tegeler M  08/13/2018, 1:24 PM  Cell [phone] If no answer or after 5 PM call [phone]

## 2018-08-13 NOTE — Progress Notes (Signed)
Provider made aware of pt HR dropping from 110's to 30 when moving as well as desaturation. RT called to bedside and giving Dulera.  Critical Hgb of 6.6 also reported.

## 2018-08-14 ENCOUNTER — Encounter (HOSPITAL_COMMUNITY): Payer: Self-pay | Admitting: *Deleted

## 2018-08-14 ENCOUNTER — Encounter (HOSPITAL_COMMUNITY)
Admission: EM | Disposition: A | Discharge status: Home Health | Payer: Self-pay | Source: Home / Self Care | Attending: Internal Medicine

## 2018-08-14 ENCOUNTER — Inpatient Hospital Stay (HOSPITAL_COMMUNITY): Payer: 59 | Admitting: Anesthesiology

## 2018-08-14 DIAGNOSIS — J9621 Acute and chronic respiratory failure with hypoxia: Secondary | ICD-10-CM | POA: Diagnosis present

## 2018-08-14 DIAGNOSIS — D62 Acute posthemorrhagic anemia: Secondary | ICD-10-CM | POA: Diagnosis present

## 2018-08-14 DIAGNOSIS — R6510 Systemic inflammatory response syndrome (SIRS) of non-infectious origin without acute organ dysfunction: Secondary | ICD-10-CM | POA: Diagnosis present

## 2018-08-14 HISTORY — PX: ESOPHAGOGASTRODUODENOSCOPY (EGD) WITH PROPOFOL: SHX5813

## 2018-08-14 LAB — HEMOGLOBIN AND HEMATOCRIT, BLOOD
HCT: 29.7 % — ABNORMAL LOW (ref 39.0–52.0)
Hemoglobin: 9.5 g/dL — ABNORMAL LOW (ref 13.0–17.0)

## 2018-08-14 LAB — CBC
HEMATOCRIT: 26 % — AB (ref 39.0–52.0)
Hemoglobin: 8.4 g/dL — ABNORMAL LOW (ref 13.0–17.0)
MCH: 28.3 pg (ref 26.0–34.0)
MCHC: 32.3 g/dL (ref 30.0–36.0)
MCV: 87.5 fL (ref 80.0–100.0)
NRBC: 0 % (ref 0.0–0.2)
PLATELETS: 180 10*3/uL (ref 150–400)
RBC: 2.97 MIL/uL — ABNORMAL LOW (ref 4.22–5.81)
RDW: 20.1 % — AB (ref 11.5–15.5)
WBC: 3.5 10*3/uL — AB (ref 4.0–10.5)

## 2018-08-14 SURGERY — ESOPHAGOGASTRODUODENOSCOPY (EGD) WITH PROPOFOL
Anesthesia: Monitor Anesthesia Care | Laterality: Left

## 2018-08-14 MED ORDER — SODIUM CHLORIDE 0.9 % IV SOLN: INTRAVENOUS | Status: DC

## 2018-08-14 MED ORDER — LACTATED RINGERS IV SOLN
INTRAVENOUS | Status: DC
  Administered 2018-08-14: 1000 mL via INTRAVENOUS

## 2018-08-14 MED ORDER — LIDOCAINE 2% (20 MG/ML) 5 ML SYRINGE
INTRAMUSCULAR | Status: DC | PRN
  Administered 2018-08-14: 60 mg via INTRAVENOUS

## 2018-08-14 MED ORDER — FUROSEMIDE 10 MG/ML IJ SOLN
40.0000 mg | Freq: Once | INTRAMUSCULAR | Status: AC
  Administered 2018-08-14: 40 mg via INTRAVENOUS
  Filled 2018-08-14: qty 4

## 2018-08-14 MED ORDER — PROPOFOL 10 MG/ML IV BOLUS
INTRAVENOUS | Status: AC
  Filled 2018-08-14: qty 40

## 2018-08-14 MED ORDER — PROPOFOL 500 MG/50ML IV EMUL
INTRAVENOUS | Status: DC | PRN
  Administered 2018-08-14: 25 ug/kg/min via INTRAVENOUS

## 2018-08-14 MED ORDER — ALPRAZOLAM 0.25 MG PO TABS
0.2500 mg | ORAL_TABLET | Freq: Three times a day (TID) | ORAL | Status: DC | PRN
  Administered 2018-08-15 – 2018-08-16 (×2): 0.25 mg via ORAL
  Filled 2018-08-14 (×2): qty 1

## 2018-08-14 MED ORDER — PROPOFOL 10 MG/ML IV BOLUS
INTRAVENOUS | Status: DC | PRN
  Administered 2018-08-14: 10 mg via INTRAVENOUS

## 2018-08-14 MED ORDER — ONDANSETRON HCL 4 MG/2ML IJ SOLN
INTRAMUSCULAR | Status: DC | PRN
  Administered 2018-08-14: 4 mg via INTRAVENOUS

## 2018-08-14 MED ORDER — FUROSEMIDE 10 MG/ML IJ SOLN: 60.0000 mg | Freq: Once | INTRAMUSCULAR | Status: DC

## 2018-08-14 MED ORDER — SODIUM CHLORIDE 0.9% IV SOLUTION
Freq: Once | INTRAVENOUS | Status: AC
  Administered 2018-08-14: 10:00:00 via INTRAVENOUS

## 2018-08-14 MED ORDER — SODIUM CHLORIDE 0.9 % IV BOLUS
250.0000 mL | Freq: Once | INTRAVENOUS | Status: AC
  Administered 2018-08-14: 250 mL via INTRAVENOUS

## 2018-08-14 SURGICAL SUPPLY — 15 items

## 2018-08-14 NOTE — Progress Notes (Signed)
PROGRESS NOTE                                                                                                                                                                                                             Patient Demographics:    Gabriel [name], is a 77 y.o. male, DOB - [birth year], UDJ:[id number]  Admit date - 08/12/2018   Admitting Physician Elwyn Reach, MD  Outpatient Primary MD for the patient is Aura Dials, MD  LOS - 2  Outpatient Specialists: Dr. Julien Nordmann  Chief Complaint  Patient presents with  . Shortness of Breath       Brief Narrative 77 year old male with history of adenocarcinoma of the right lung, skin cancer, hypertension, COPD presented with increasing shortness of breath and some productive cough.  He reports that he has noticed some dark stool thinks that it was due to the iron supplement he has been on recently.  Denies any hematemesis or bright red blood per rectum.  Reports he has been taking Advil 2 tablets every 4 hours for several months for some pain and discomfort.  Denies any headache, dizziness or syncope.  In the ED he was found to have hemoglobin of 5.1 (baseline around 11) and positive Hemoccult.  Patient was also hypertensive with systolic blood pressure of 86, tachycardic and tachypneic.  His WBC was 2.4 and platelets of 113, sodium of 144 and potassium of 3.6. Patient admitted to telemetry and with 2 unit PRBC given.    Subjective:   Patient was tachypneic and tachycardic all day with a drop in blood pressure to the 80s during the afternoon.  He was then transferred to stepdown unit for close monitoring as he was also having melanotic stool.  Follow-up with hemoglobin was 6 in the afternoon.  This morning he still has shortness of breath and appears congested.  Heart rate reportedly was dropping down to the 30s last evening and desatted into the 70s per the nurse.   Assessment  & Plan :    Principal Problem: Systemic inflammatory response syndrome (SIRS) Symptomatic anemia/acute blood loss anemia Received total 4 unit PRBC and hemoglobin is 9.5 this morning.  Suspect upper GI bleed in the setting of heavy NSAID use (was taking 2 tablets of Advil every 4 hours for pain and fever like symptoms for several months). On  PPI drip. Seen by GI and plan on EGD this morning.   Active Problems: Acute on chronic respiratory failure with hypoxia (HCC) Suspect due to symptomatic anemia.  Patient uses oxygen as needed at home and currently requiring 3 L.  Patient appears congested on exam this morning.  Discontinued IV fluids and ordered 40 mg IV Lasix.  Wean oxygen as tolerated.    Non-small cell carcinoma of right lung, stage 3 (HCC) Currently getting systemic chemotherapy with Alimta every 3 weeks and has received 4 cycles of treatment.  Notified his oncologist via epic.  Stage III sacral ulcer Wound care consult appreciated.  Nutrition consult placed.  SIRS with hypotension and tachycardia Secondary to blood loss anemia.  Blood pressure medications on hold.  Required fluid bolus yesterday.  Stable this morning.  Pancytopenia Secondary to chemotherapy and acute blood loss anemia.  Noted for drop in platelets to the 50s yesterday.  Repeat lab today pending.  Ordered 2 units platelet by GI prior to procedure.  Avoiding heparin products.   Code Status : Full code  Family Communication  : Son and daughter at bedside  Disposition Plan  : Home pending hospital course.  Can transfer to telemetry this afternoon if stable after EGD.  Barriers For Discharge : Active symptoms  Consults  : [facility]  Procedures  : None DVT Prophylaxis  : SCDs  Lab Results  Component Value Date   PLT 57 (L) 08/13/2018    Antibiotics  :   Anti-infectives (From admission, onward)   None        Objective:   Vitals:   08/14/18 0852 08/14/18 0900 08/14/18 0923 08/14/18  0941  BP:  129/70 115/66 124/69  Pulse:  96 92 97  Resp:  (!) 31 (!) 30 (!) 30  Temp:   (!) 97.5 F (36.4 C) 98 F (36.7 C)  TempSrc:   Oral Oral  SpO2: 100% 100% 100% 100%    Wt Readings from Last 3 Encounters:  08/04/18 43.9 kg  07/16/18 45.7 kg  06/25/18 45.3 kg     Intake/Output Summary (Last 24 hours) at 08/14/2018 1025 Last data filed at 08/14/2018 0900 Gross per 24 hour  Intake 5360.63 ml  Output 1377 ml  Net 3983.63 ml    Physical exam Not in distress, fatigue HEENT: Pallor present, moist mucosa, supple neck Chest: Coarse breath sounds bilaterally CVS: S1 and S2 tachycardic, no murmurs rub or gallop GI: Soft, nondistended, nontender Musculoskeletal: Warm, no edema      Data Review:    CBC Recent Labs  Lab 08/12/18 1643  08/12/18 2050 08/13/18 0818 08/13/18 1618 08/13/18 1937 08/14/18 0511  WBC 2.1*  --  2.4* 3.0*  3.0* 2.9*  --   --   HGB 6.1*   < > 5.1* 5.8*  5.9* 6.4* 6.6* 9.5*  HCT 20.1*   < > 17.4* 18.3*  18.6* 20.8* 20.7* 29.7*  PLT 131*  --  113* 85*  85* 57*  --   --   MCV 103.6*  --  106.1* 97.9  97.9 96.3  --   --   MCH 31.4  --  31.1 31.0  31.1 29.6  --   --   MCHC 30.3  --  29.3* 31.7  31.7 30.8  --   --   RDW 20.7*  --  20.6* 17.2*  17.2* 16.3*  --   --    < > = values in this interval not displayed.    Chemistries  Recent Labs  Lab 08/12/18 1643 08/12/18 1658 08/13/18 0818  NA 148* 144 145  K 4.0 3.6 3.9  CL 112* 111 118*  CO2 27  --  22  GLUCOSE 120* 108* 137*  BUN 69* 68* 72*  CREATININE 1.03 0.90 0.94  CALCIUM 8.6*  --  7.3*  AST  --   --  13*  ALT  --   --  10  ALKPHOS  --   --  36*  BILITOT  --   --  0.5   ------------------------------------------------------------------------------------------------------------------ No results for input(s): CHOL, HDL, LDLCALC, TRIG, CHOLHDL, LDLDIRECT in the last 72 hours.  No results found for:  HGBA1C ------------------------------------------------------------------------------------------------------------------ No results for input(s): TSH, T4TOTAL, T3FREE, THYROIDAB in the last 72 hours.  Invalid input(s): FREET3 ------------------------------------------------------------------------------------------------------------------ No results for input(s): VITAMINB12, FOLATE, FERRITIN, TIBC, IRON, RETICCTPCT in the last 72 hours.  Coagulation profile No results for input(s): INR, PROTIME in the last 168 hours.  No results for input(s): DDIMER in the last 72 hours.  Cardiac Enzymes No results for input(s): CKMB, TROPONINI, MYOGLOBIN in the last 168 hours.  Invalid input(s): CK ------------------------------------------------------------------------------------------------------------------    Component Value Date/Time   BNP 42.7 07/28/2015 0720    Inpatient Medications  Scheduled Meds: . sodium chloride   Intravenous Once  . folic acid  1 mg Oral Daily  . magic mouthwash  5 mL Oral QID  . mouth rinse  15 mL Mouth Rinse BID  . mometasone-formoterol  2 puff Inhalation BID  . multivitamin with minerals  1 tablet Oral Daily  . [START ON 08/16/2018] pantoprazole  40 mg Intravenous Q12H  . predniSONE  20 mg Oral BID WC   Continuous Infusions: . pantoprozole (PROTONIX) infusion 8 mg/hr (08/14/18 0900)   PRN Meds:.ALPRAZolam, guaiFENesin, ondansetron **OR** ondansetron (ZOFRAN) IV, prochlorperazine  Micro Results Recent Results (from the past 240 hour(s))  MRSA PCR Screening     Status: None   Collection Time: 08/13/18  4:13 PM  Result Value Ref Range Status   MRSA by PCR NEGATIVE NEGATIVE Final    Comment:        The GeneXpert MRSA Assay (FDA approved for NASAL specimens only), is one component of a comprehensive MRSA colonization surveillance program. It is not intended to diagnose MRSA infection nor to guide or monitor treatment for MRSA infections. Performed  at [facility], [place] [address], [place], [place] [address]     Radiology Reports Dg Chest 2 View  Result Date: 08/12/2018 CLINICAL DATA:  Increasing weakness and fatigue. EXAM: CHEST - 2 VIEW COMPARISON:  06/18/2018 CXR, chest CT 07/15/2018 FINDINGS: Confluent opacity with volume loss involving the right upper lung. Stable aerated appearance of the right lung base with compensatory hyperinflation of the left lung. Uncoiled appearance of the thoracic aorta. Top-normal heart size. Chronic atelectasis/consolidation along the posteromedial aspect of the right lung base. IMPRESSION: Chronic stable pulmonary consolidations in the right upper and lower lobes. Compensatory hyperinflation of the left lung unchanged. Electronically Signed   By: Ashley Royalty M.D.   On: 08/12/2018 17:25   Ct Chest W Contrast  Result Date: 07/16/2018 CLINICAL DATA:  Lung cancer EXAM: CT CHEST, ABDOMEN, AND PELVIS WITH CONTRAST TECHNIQUE: Multidetector CT imaging of the chest, abdomen and pelvis was performed following the standard protocol during bolus administration of intravenous contrast. CONTRAST:  2mL OMNIPAQUE IOHEXOL 300 MG/ML  SOLN COMPARISON:  04/25/2018 FINDINGS: CT CHEST FINDINGS Cardiovascular: The heart size is normal. No substantial pericardial effusion. Coronary artery calcification  is evident. Atherosclerotic calcification is noted in the wall of the thoracic aorta. Mediastinum/Nodes: Similar appearance of mild mediastinal lymphadenopathy. No left hilar lymphadenopathy. Right hilum diffusely encased by abnormal soft tissue as before. The esophagus has normal imaging features. There is no axillary lymphadenopathy. Lungs/Pleura: The central tracheobronchial airways are patent. Similar appearance of mass-effect on the right mainstem bronchus and bronchus intermedius. Continued further increase in enhancing pleural nodularity at the right apex. 1.9 x 2.9 cm lesion (24/2) on today's study appears to be  in the chest wall and was 1.2 x 2.4 cm previously. Marked volume loss in the right hemithorax evident with consolidation of the right upper lobe and collapse of the right lower lobe, similar to prior. Left lung is hyperexpanded and markedly emphysematous. 14 mm subpleural lesion in the left upper lobe is stable at 14 mm. Area of probable scarring in the medial left upper lobe is stable. Architectural distortion with subtle ill-defined nodularity posterior left upper lobe (47/4) shows no substantial change. Spiculated left lower lobe nodule is stable to minimally decreased measuring 6 x 6 mm today compared to 6 x 7 mm previously. Areas of architectural distortion/nodularity at the left base are similar although a medial nodule (138/4) measures 8 mm today is new in the interval. This probably represents an area of atypical infection. 4 mm cavitary nodule posterior left lower lobe (136/4) is new in the interval. Musculoskeletal: Lesion in the right T11 vertebral body appears progressive in the interval and abnormal soft tissue in the right paraspinal space at T11-12 is similar with neural foraminal involvement. CT ABDOMEN PELVIS FINDINGS Hepatobiliary: No focal abnormality within the liver parenchyma. There is no evidence for gallstones, gallbladder wall thickening, or pericholecystic fluid. Mild biliary prominence without overt biliary duct dilatation. Pancreas: No focal mass lesion. No dilatation of the main duct. No intraparenchymal cyst. No peripancreatic edema. Spleen: No splenomegaly. No focal mass lesion. Adrenals/Urinary Tract: No adrenal nodule or mass. Similar appearance of differential perfusion right upper renal pole, nonspecific given persistence. Nonobstructing stones are seen in the upper pole the right kidney. Left kidney unremarkable. No evidence for hydroureter. The urinary bladder appears normal for the degree of distention. Stomach/Bowel: Stomach is markedly distended no obstructing mass evident.  Duodenum is normally positioned as is the ligament of Treitz. No small bowel wall thickening. No small bowel dilatation. Terminal ileum not well seen. The appendix is not visualized, but there is no edema or inflammation in the region of the cecum. Colon is decompressed. Vascular/Lymphatic: There is abdominal aortic atherosclerosis without aneurysm. There is no gastrohepatic or hepatoduodenal ligament lymphadenopathy. No intraperitoneal or retroperitoneal lymphadenopathy. No pelvic sidewall lymphadenopathy. Reproductive: Prostate gland is markedly enlarged. Other: Small volume intraperitoneal free fluid wall edema. There is diffuse mesenteric and body Musculoskeletal: Right groin hernia contains bowel loops without complicating features. Scattered bony metastases again noted. Left sacral lesion is more conspicuous today. Similar appearance of the L1 lesion. IMPRESSION: 1. Progression of disease in the right upper hemithorax with more prominent pleural nodularity and chest wall disease. 2. Stable collapse right lower lobe. 3. Similar appearance multiple left lung nodules with new clustered nodularity posterior left lower lobe, likely related to infection/inflammation although aspiration could have this appearance. 4. Progression of metastatic involvement at T11 with similar appearance of the extraosseous paraspinal disease at T11-12. Remaining bony metastatic involvement is similar. 5. Stomach is markedly distended and fluid-filled. No obstructing lesion evident. 6.  Aortic Atherosclerois (ICD10-170.0) Electronically Signed   By: Verda Cumins.D.  On: 07/16/2018 09:54   Ct Abdomen Pelvis W Contrast  Result Date: 07/16/2018 CLINICAL DATA:  Lung cancer EXAM: CT CHEST, ABDOMEN, AND PELVIS WITH CONTRAST TECHNIQUE: Multidetector CT imaging of the chest, abdomen and pelvis was performed following the standard protocol during bolus administration of intravenous contrast. CONTRAST:  81mL OMNIPAQUE IOHEXOL 300 MG/ML   SOLN COMPARISON:  04/25/2018 FINDINGS: CT CHEST FINDINGS Cardiovascular: The heart size is normal. No substantial pericardial effusion. Coronary artery calcification is evident. Atherosclerotic calcification is noted in the wall of the thoracic aorta. Mediastinum/Nodes: Similar appearance of mild mediastinal lymphadenopathy. No left hilar lymphadenopathy. Right hilum diffusely encased by abnormal soft tissue as before. The esophagus has normal imaging features. There is no axillary lymphadenopathy. Lungs/Pleura: The central tracheobronchial airways are patent. Similar appearance of mass-effect on the right mainstem bronchus and bronchus intermedius. Continued further increase in enhancing pleural nodularity at the right apex. 1.9 x 2.9 cm lesion (24/2) on today's study appears to be in the chest wall and was 1.2 x 2.4 cm previously. Marked volume loss in the right hemithorax evident with consolidation of the right upper lobe and collapse of the right lower lobe, similar to prior. Left lung is hyperexpanded and markedly emphysematous. 14 mm subpleural lesion in the left upper lobe is stable at 14 mm. Area of probable scarring in the medial left upper lobe is stable. Architectural distortion with subtle ill-defined nodularity posterior left upper lobe (47/4) shows no substantial change. Spiculated left lower lobe nodule is stable to minimally decreased measuring 6 x 6 mm today compared to 6 x 7 mm previously. Areas of architectural distortion/nodularity at the left base are similar although a medial nodule (138/4) measures 8 mm today is new in the interval. This probably represents an area of atypical infection. 4 mm cavitary nodule posterior left lower lobe (136/4) is new in the interval. Musculoskeletal: Lesion in the right T11 vertebral body appears progressive in the interval and abnormal soft tissue in the right paraspinal space at T11-12 is similar with neural foraminal involvement. CT ABDOMEN PELVIS FINDINGS  Hepatobiliary: No focal abnormality within the liver parenchyma. There is no evidence for gallstones, gallbladder wall thickening, or pericholecystic fluid. Mild biliary prominence without overt biliary duct dilatation. Pancreas: No focal mass lesion. No dilatation of the main duct. No intraparenchymal cyst. No peripancreatic edema. Spleen: No splenomegaly. No focal mass lesion. Adrenals/Urinary Tract: No adrenal nodule or mass. Similar appearance of differential perfusion right upper renal pole, nonspecific given persistence. Nonobstructing stones are seen in the upper pole the right kidney. Left kidney unremarkable. No evidence for hydroureter. The urinary bladder appears normal for the degree of distention. Stomach/Bowel: Stomach is markedly distended no obstructing mass evident. Duodenum is normally positioned as is the ligament of Treitz. No small bowel wall thickening. No small bowel dilatation. Terminal ileum not well seen. The appendix is not visualized, but there is no edema or inflammation in the region of the cecum. Colon is decompressed. Vascular/Lymphatic: There is abdominal aortic atherosclerosis without aneurysm. There is no gastrohepatic or hepatoduodenal ligament lymphadenopathy. No intraperitoneal or retroperitoneal lymphadenopathy. No pelvic sidewall lymphadenopathy. Reproductive: Prostate gland is markedly enlarged. Other: Small volume intraperitoneal free fluid wall edema. There is diffuse mesenteric and body Musculoskeletal: Right groin hernia contains bowel loops without complicating features. Scattered bony metastases again noted. Left sacral lesion is more conspicuous today. Similar appearance of the L1 lesion. IMPRESSION: 1. Progression of disease in the right upper hemithorax with more prominent pleural nodularity and chest wall disease. 2.  Stable collapse right lower lobe. 3. Similar appearance multiple left lung nodules with new clustered nodularity posterior left lower lobe, likely  related to infection/inflammation although aspiration could have this appearance. 4. Progression of metastatic involvement at T11 with similar appearance of the extraosseous paraspinal disease at T11-12. Remaining bony metastatic involvement is similar. 5. Stomach is markedly distended and fluid-filled. No obstructing lesion evident. 6.  Aortic Atherosclerois (ICD10-170.0) Electronically Signed   By: Misty Stanley M.D.   On: 07/16/2018 09:54    Time Spent in minutes  35   Yasaman Kolek M.D on 08/14/2018 at 10:25 AM  Between 7am to 7pm - Pager - [phone]  After 7pm go to [email or web address] - password [facility]  [facility] -  Office  [phone]

## 2018-08-14 NOTE — Plan of Care (Signed)
Patient significantly less tachypneic today.  Still some melena and maroon stools.  Platelets drifting down, most recently 57.  Hgb up to 9.5.  Would give 2 units platelets pre-procedure; appears acceptable for endoscopy today at 11:30.  Risks, benefits reviewed with patient, son, and daughter, and they elect to proceed.  Continue PPI and supportive care in the interim.

## 2018-08-14 NOTE — Transfer of Care (Signed)
Immediate Anesthesia Transfer of Care Note  Patient: Gabriel [name]  Procedure(s) Performed: ESOPHAGOGASTRODUODENOSCOPY (EGD) WITH PROPOFOL (Left )  Patient Location: PACU  Anesthesia Type:MAC  Level of Consciousness: awake, alert  and oriented  Airway & Oxygen Therapy: Patient Spontanous Breathing and Patient connected to nasal cannula oxygen  Post-op Assessment: Report given to RN and Post -op Vital signs reviewed and stable  Post vital signs: Reviewed and stable  Last Vitals:  Vitals Value Taken Time  BP    Temp    Pulse    Resp 33 08/14/2018  1:14 PM  SpO2    Vitals shown include unvalidated device data.  Last Pain:  Vitals:   08/14/18 1229  TempSrc: Oral  PainSc:       Patients Stated Pain Goal: 2 (71/06/26 9485)  Complications: No apparent anesthesia complications

## 2018-08-14 NOTE — Interval H&P Note (Signed)
History and Physical Interval Note:  08/14/2018 12:42 PM  Gabriel [name]  has presented today for surgery, with the diagnosis of melena, anemia  The various methods of treatment have been discussed with the patient and family. After consideration of risks, benefits and other options for treatment, the patient has consented to  Procedure(s): ESOPHAGOGASTRODUODENOSCOPY (EGD) WITH PROPOFOL (Left) as a surgical intervention .  The patient's history has been reviewed, patient examined, no change in status, stable for surgery.  I have reviewed the patient's chart and labs.  Questions were answered to the patient's satisfaction.     Landry Dyke

## 2018-08-14 NOTE — Anesthesia Procedure Notes (Signed)
Procedure Name: MAC Date/Time: 08/14/2018 12:45 PM Performed by: Maxwell Caul, CRNA Pre-anesthesia Checklist: Patient identified, Emergency Drugs available, Suction available and Patient being monitored Oxygen Delivery Method: Nasal cannula

## 2018-08-14 NOTE — Anesthesia Postprocedure Evaluation (Signed)
Anesthesia Post Note  Patient: Gabriel [name]  Procedure(s) Performed: ESOPHAGOGASTRODUODENOSCOPY (EGD) WITH PROPOFOL (Left )     Patient location during evaluation: Endoscopy Anesthesia Type: MAC Level of consciousness: awake and alert Pain management: pain level controlled Vital Signs Assessment: post-procedure vital signs reviewed and stable Respiratory status: spontaneous breathing, nonlabored ventilation, respiratory function stable and patient connected to nasal cannula oxygen Cardiovascular status: stable and blood pressure returned to baseline Postop Assessment: no apparent nausea or vomiting Anesthetic complications: no    Last Vitals:  Vitals:   08/14/18 1330 08/14/18 1340  BP: (!) 112/53 (!) 118/59  Pulse: 96   Resp: (!) 31 (!) 37  Temp:    SpO2: (!) (P) 83%     Last Pain:  Vitals:   08/14/18 1323  TempSrc:   PainSc: 0-No pain                 Barnet Glasgow

## 2018-08-14 NOTE — Op Note (Signed)
[facility] Patient Name: Gabriel [name] Procedure Date: 08/14/2018 MRN: [id number] Attending MD: Arta Silence , MD Date of Birth: 02/03/1941 CSN: [id number] Age: 77 Admit Type: Inpatient Procedure:                Upper GI endoscopy Indications:              Acute post hemorrhagic anemia, Melena Providers:                Arta Silence, MD, Baird Cancer, RN, Charolette Child, Technician, Virgia Land, CRNA Referring MD:              Medicines:                Monitored Anesthesia Care Complications:            No immediate complications. Estimated Blood Loss:     Estimated blood loss: none. Procedure:                Pre-Anesthesia Assessment:                           - Prior to the procedure, a History and Physical                            was performed, and patient medications and                            allergies were reviewed. The patient's tolerance of                            previous anesthesia was also reviewed. The risks                            and benefits of the procedure and the sedation                            options and risks were discussed with the patient.                            All questions were answered, and informed consent                            was obtained. Prior Anticoagulants: The patient has                            taken previous NSAID medication. ASA Grade                            Assessment: IV - A patient with severe systemic                            disease that is a constant threat to life. After  reviewing the risks and benefits, the patient was                            deemed in satisfactory condition to undergo the                            procedure.                           After obtaining informed consent, the endoscope was                            passed under direct vision. Throughout the                            procedure, the patient's blood  pressure, pulse, and                            oxygen saturations were monitored continuously. The                            [id number]-H190 ([id number]) Olympus adult endoscope was                            introduced through the mouth, and advanced to the                            second part of duodenum. The upper GI endoscopy was                            accomplished without difficulty. The patient                            tolerated the procedure well. Scope In: Scope Out: Findings:      A small hiatal hernia was present.      One benign-appearing, intrinsic mild stenosis was found.      Esophagitis was found.      The exam of the esophagus was otherwise normal.      Mild inflammation was found in the gastric body and in the gastric       antrum.      The exam of the stomach was otherwise normal.      One non-bleeding superficial duodenal ulcer with no stigmata of bleeding       was found in the second portion of the duodenum. The lesion was 4 mm in       largest dimension.      One non-bleeding cratered duodenal ulcer with pigmented material was       found in the duodenal bulb. The lesion was 15 mm in largest dimension.      The exam of the duodenum was otherwise normal.      No old or fresh blood was seen to the extent of our examination. Impression:               - Small hiatal hernia.                           -  Benign-appearing esophageal stenosis.                           - Esophagitis.                           - Gastritis.                           - One non-bleeding duodenal ulcer with no stigmata                            of bleeding.                           - One non-bleeding duodenal ulcer with pigmented                            material. Bleeding risk < 20% based on these                            stigmata.                           - Bleeding and acute blood loss anemia highly                            likely from large duodenal ulcer, which in turn is                             highly likely NSAID-mediated. Moderate Sedation:      None Recommendation:           - Return patient to hospital ward for ongoing care.                           - Clear liquid diet today.                           - Continue present medications.                           - Pantoprazole IV q12 hrs x another 24 hours, then                            40 mg po bid x 6 weeks, then 40 mg po qd                            indefinitely.                           - No NSAIDs indefinitely.                           [facility] will follow. Procedure Code(s):        --- Professional ---  43329, Esophagogastroduodenoscopy, flexible,                            transoral; diagnostic, including collection of                            specimen(s) by brushing or washing, when performed                            (separate procedure) Diagnosis Code(s):        --- Professional ---                           K44.9, Diaphragmatic hernia without obstruction or                            gangrene                           K22.2, Esophageal obstruction                           K20.9, Esophagitis, unspecified                           K29.70, Gastritis, unspecified, without bleeding                           K26.9, Duodenal ulcer, unspecified as acute or                            chronic, without hemorrhage or perforation                           D62, Acute posthemorrhagic anemia                           K92.1, Melena (includes Hematochezia) CPT copyright 2018 [employer] Association. All rights reserved. The codes documented in this report are preliminary and upon coder review may  be revised to meet current compliance requirements. Arta Silence, MD 08/14/2018 1:36:16 PM This report has been signed electronically. Number of Addenda: 0

## 2018-08-15 DIAGNOSIS — K269 Duodenal ulcer, unspecified as acute or chronic, without hemorrhage or perforation: Secondary | ICD-10-CM

## 2018-08-15 LAB — BPAM PLATELET PHERESIS
Blood Product Expiration Date: 201910182359
Blood Product Expiration Date: 201910182359
ISSUE DATE / TIME: 201910170919
ISSUE DATE / TIME: 201910171058
Unit Type and Rh: 5100
Unit Type and Rh: 5100

## 2018-08-15 LAB — CBC
HCT: 21.7 % — ABNORMAL LOW (ref 39.0–52.0)
HEMOGLOBIN: 7 g/dL — AB (ref 13.0–17.0)
MCH: 28.6 pg (ref 26.0–34.0)
MCHC: 32.3 g/dL (ref 30.0–36.0)
MCV: 88.6 fL (ref 80.0–100.0)
PLATELETS: 139 10*3/uL — AB (ref 150–400)
RBC: 2.45 MIL/uL — AB (ref 4.22–5.81)
RDW: 20.3 % — AB (ref 11.5–15.5)
WBC: 3.5 10*3/uL — ABNORMAL LOW (ref 4.0–10.5)
nRBC: 0 % (ref 0.0–0.2)

## 2018-08-15 LAB — PREPARE PLATELET PHERESIS
Unit division: 0
Unit division: 0

## 2018-08-15 LAB — HEMOGLOBIN AND HEMATOCRIT, BLOOD
HCT: 28.5 % — ABNORMAL LOW (ref 39.0–52.0)
Hemoglobin: 9.2 g/dL — ABNORMAL LOW (ref 13.0–17.0)

## 2018-08-15 LAB — PREPARE RBC (CROSSMATCH)

## 2018-08-15 MED ORDER — FUROSEMIDE 10 MG/ML IJ SOLN
40.0000 mg | Freq: Once | INTRAMUSCULAR | Status: AC
  Administered 2018-08-15: 40 mg via INTRAVENOUS
  Filled 2018-08-15: qty 4

## 2018-08-15 MED ORDER — GUAIFENESIN ER 600 MG PO TB12
1200.0000 mg | ORAL_TABLET | Freq: Two times a day (BID) | ORAL | Status: DC
  Administered 2018-08-15 – 2018-08-16 (×2): 1200 mg via ORAL
  Filled 2018-08-15 (×2): qty 2

## 2018-08-15 MED ORDER — GUAIFENESIN-DM 100-10 MG/5ML PO SYRP: 5.0000 mL | ORAL_SOLUTION | ORAL | Status: DC | PRN

## 2018-08-15 MED ORDER — SODIUM CHLORIDE 0.9% IV SOLUTION
Freq: Once | INTRAVENOUS | Status: AC
  Administered 2018-08-15: 09:00:00 via INTRAVENOUS

## 2018-08-15 NOTE — Progress Notes (Signed)
PROGRESS NOTE                                                                                                                                                                                                             Patient Demographics:    Gabriel [name], is a 77 y.o. male, DOB - [birth year], RPR:[id number]  Admit date - 08/12/2018   Admitting Physician Elwyn Reach, MD  Outpatient Primary MD for the patient is Aura Dials, MD  LOS - 3  Outpatient Specialists: Dr. Julien Nordmann  Chief Complaint  Patient presents with  . Shortness of Breath       Brief Narrative 77 year old male with history of adenocarcinoma of the right lung, skin cancer, hypertension, COPD presented with increasing shortness of breath and some productive cough.  He reports that he has noticed some dark stool thinks that it was due to the iron supplement he has been on recently.  Denies any hematemesis or bright red blood per rectum.  Reports he has been taking Advil 2 tablets every 4 hours for several months for some pain and discomfort.  Denies any headache, dizziness or syncope.  In the ED he was found to have hemoglobin of 5.1 (baseline around 11) and positive Hemoccult.  Patient was also hypertensive with systolic blood pressure of 86, tachycardic and tachypneic.  His WBC was 2.4 and platelets of 113, sodium of 144 and potassium of 3.6. Patient admitted to telemetry and with 2 unit PRBC given.    Subjective:   Had a dark stool this morning.  Desatted to the 80s when trying to get up yesterday.  Reports breathing somewhat better today but not at baseline..  Still having cough.   Assessment  & Plan :    Principal Problem: Systemic inflammatory response syndrome (SIRS) Symptomatic anemia/acute blood loss anemia Has received total 4 unit PRBC.  Hemoglobin again dropped to 7 this morning.  Had dark stool this morning which may be retained blood.   EGD showing esophagitis, gastritis and nonbleeding duodenal ulcer measuring 4 mm in largest diameter.  This is likely the source of acute blood loss anemia caused by heavy NSAID use l(was taking 2 tablets of Advil every 4 hours for pain and fever like symptoms for several months). Ordered 1 unit PRBC transfusion this morning. Stable to be transferred to medical floor.  Active Problems: Acute on chronic respiratory failure with hypoxia (HCC) Secondary to symptomatic anemia.  Currently on 3 L and maintaining sats.  Lung sounds clear.  IV Lasix posttransfusion.  Continue antitussives.    Non-small cell carcinoma of right lung, stage 3 (HCC) Currently getting systemic chemotherapy with Alimta every 3 weeks and has received 4 cycles of treatment.  Oncologist saw patient while in the hospital.  Stage III sacral ulcer Wound care consult appreciated.  Added supplement.  Severe protein calorie malnutrition (HCC) Added supplement.  SIRS with hypotension and tachycardia Secondary to blood loss anemia.  Blood pressure medications on hold.  Currently stable.   Pancytopenia Secondary to chemotherapy and acute blood loss anemia.  Noted for drop in platelets to the 50s.  Received 2 unit platelets and has improved.    Code Status : Full code  Family Communication  : Son at bedside  Disposition Plan  : Home possibly tomorrow if H&H stable.  Barriers For Discharge : Active symptoms  Consults  : [facility]  Procedures  : EGD DVT Prophylaxis  : SCDs  Lab Results  Component Value Date   PLT 139 (L) 08/15/2018    Antibiotics  :   Anti-infectives (From admission, onward)   None        Objective:   Vitals:   08/15/18 0845 08/15/18 0850 08/15/18 0925 08/15/18 1000  BP:  106/62 (!) 115/52 (!) 105/53  Pulse:  90 99 96  Resp:  (!) 23 (!) 24 (!) 32  Temp:  (!) 97.5 F (36.4 C) 97.6 F (36.4 C)   TempSrc:  Axillary Axillary   SpO2: 95% 93% 96% 96%    Wt Readings from Last 3  Encounters:  08/04/18 43.9 kg  07/16/18 45.7 kg  06/25/18 45.3 kg     Intake/Output Summary (Last 24 hours) at 08/15/2018 1022 Last data filed at 08/15/2018 1000 Gross per 24 hour  Intake 1928.54 ml  Output 2775 ml  Net -846.46 ml   Physical exam Elderly male, fatigued, not in distress HEENT: Pallor present, moist mucosa, supple neck Chest: Clear bilaterally CVS: Normal S1 and S2, no murmurs GI: Soft, nondistended, nontender Musculoskeletal: Warm, no edema       Data Review:    CBC Recent Labs  Lab 08/12/18 2050 08/13/18 0818 08/13/18 1618 08/13/18 1937 08/14/18 0511 08/14/18 1448 08/15/18 0317  WBC 2.4* 3.0*  3.0* 2.9*  --   --  3.5* 3.5*  HGB 5.1* 5.8*  5.9* 6.4* 6.6* 9.5* 8.4* 7.0*  HCT 17.4* 18.3*  18.6* 20.8* 20.7* 29.7* 26.0* 21.7*  PLT 113* 85*  85* 57*  --   --  180 139*  MCV 106.1* 97.9  97.9 96.3  --   --  87.5 88.6  MCH 31.1 31.0  31.1 29.6  --   --  28.3 28.6  MCHC 29.3* 31.7  31.7 30.8  --   --  32.3 32.3  RDW 20.6* 17.2*  17.2* 16.3*  --   --  20.1* 20.3*    Chemistries  Recent Labs  Lab 08/12/18 1643 08/12/18 1658 08/13/18 0818  NA 148* 144 145  K 4.0 3.6 3.9  CL 112* 111 118*  CO2 27  --  22  GLUCOSE 120* 108* 137*  BUN 69* 68* 72*  CREATININE 1.03 0.90 0.94  CALCIUM 8.6*  --  7.3*  AST  --   --  13*  ALT  --   --  10  ALKPHOS  --   --  36*  BILITOT  --   --  0.5   ------------------------------------------------------------------------------------------------------------------ No results for input(s): CHOL, HDL, LDLCALC, TRIG, CHOLHDL, LDLDIRECT in the last 72 hours.  No results found for: HGBA1C ------------------------------------------------------------------------------------------------------------------ No results for input(s): TSH, T4TOTAL, T3FREE, THYROIDAB in the last 72 hours.  Invalid input(s):  FREET3 ------------------------------------------------------------------------------------------------------------------ No results for input(s): VITAMINB12, FOLATE, FERRITIN, TIBC, IRON, RETICCTPCT in the last 72 hours.  Coagulation profile No results for input(s): INR, PROTIME in the last 168 hours.  No results for input(s): DDIMER in the last 72 hours.  Cardiac Enzymes No results for input(s): CKMB, TROPONINI, MYOGLOBIN in the last 168 hours.  Invalid input(s): CK ------------------------------------------------------------------------------------------------------------------    Component Value Date/Time   BNP 42.7 07/28/2015 0720    Inpatient Medications  Scheduled Meds: . folic acid  1 mg Oral Daily  . furosemide  40 mg Intravenous Once  . guaiFENesin  1,200 mg Oral BID  . magic mouthwash  5 mL Oral QID  . mouth rinse  15 mL Mouth Rinse BID  . mometasone-formoterol  2 puff Inhalation BID  . multivitamin with minerals  1 tablet Oral Daily  . [START ON 08/16/2018] pantoprazole  40 mg Intravenous Q12H  . predniSONE  20 mg Oral BID WC   Continuous Infusions: . pantoprozole (PROTONIX) infusion 8 mg/hr (08/15/18 1000)   PRN Meds:.ALPRAZolam, guaiFENesin-dextromethorphan, ondansetron **OR** ondansetron (ZOFRAN) IV, prochlorperazine  Micro Results Recent Results (from the past 240 hour(s))  MRSA PCR Screening     Status: None   Collection Time: 08/13/18  4:13 PM  Result Value Ref Range Status   MRSA by PCR NEGATIVE NEGATIVE Final    Comment:        The GeneXpert MRSA Assay (FDA approved for NASAL specimens only), is one component of a comprehensive MRSA colonization surveillance program. It is not intended to diagnose MRSA infection nor to guide or monitor treatment for MRSA infections. Performed at [facility], [place] [address] [place], [place] [address]     Radiology Reports Dg Chest 2 View  Result Date: 08/12/2018 CLINICAL DATA:   Increasing weakness and fatigue. EXAM: CHEST - 2 VIEW COMPARISON:  06/18/2018 CXR, chest CT 07/15/2018 FINDINGS: Confluent opacity with volume loss involving the right upper lung. Stable aerated appearance of the right lung base with compensatory hyperinflation of the left lung. Uncoiled appearance of the thoracic aorta. Top-normal heart size. Chronic atelectasis/consolidation along the posteromedial aspect of the right lung base. IMPRESSION: Chronic stable pulmonary consolidations in the right upper and lower lobes. Compensatory hyperinflation of the left lung unchanged. Electronically Signed   By: Ashley Royalty M.D.   On: 08/12/2018 17:25    Time Spent in minutes  35   Lawrie Tunks M.D on 08/15/2018 at 10:22 AM  Between 7am to 7pm - Pager - [phone]  After 7pm go to [email or web address] - password [email or web address]  [facility] -  Office  [phone]

## 2018-08-15 NOTE — Progress Notes (Signed)
Subjective: Some dark stool overnight. No abdominal pain.  Objective: Vital signs in last 24 hours: Temp:  [97.5 F (36.4 C)-98.6 F (37 C)] 97.7 F (36.5 C) (10/18 1149) Pulse Rate:  [90-103] 91 (10/18 1149) Resp:  [10-37] 32 (10/18 1149) BP: (84-118)/(42-94) 112/66 (10/18 1149) SpO2:  [83 %-98 %] 94 % (10/18 1149) Weight change:  Last BM Date: 08/15/18  PE: GEN:  Chronically ill-appearing but NAD  Lab Results: CBC    Component Value Date/Time   WBC 3.5 (L) 08/15/2018 0317   RBC 2.45 (L) 08/15/2018 0317   HGB 7.0 (L) 08/15/2018 0317   HGB 10.6 (L) 08/04/2018 1359   HGB 13.1 10/14/2017 1522   HCT 21.7 (L) 08/15/2018 0317   HCT 39.8 10/14/2017 1522   PLT 139 (L) 08/15/2018 0317   PLT 311 08/04/2018 1359   PLT 263 10/14/2017 1522   MCV 88.6 08/15/2018 0317   MCV 90.6 10/14/2017 1522   MCH 28.6 08/15/2018 0317   MCHC 32.3 08/15/2018 0317   RDW 20.3 (H) 08/15/2018 0317   RDW 14.3 10/14/2017 1522   LYMPHSABS 0.3 (L) 08/04/2018 1359   LYMPHSABS 0.8 (L) 10/14/2017 1522   MONOABS 0.3 08/04/2018 1359   MONOABS 0.5 10/14/2017 1522   EOSABS 0.0 08/04/2018 1359   EOSABS 0.2 10/14/2017 1522   BASOSABS 0.0 08/04/2018 1359   BASOSABS 0.0 10/14/2017 1522   Assessment:  1.  Melena, improving; suspect black stool last night is old blood. 2. Acute blood loss anemia, hgb worsening overnight. 3.  Duodenal ulcer, large cratered with pigmented flat spot.  Likely cause of #1 and #2 above. 4.  Metastatic lung cancer, receiving chemotherapy.  Plan:  1.  Advance diet. 2.  PPI gtt today, then transition tomorrow to pantoprazole 40 mg po bid x 6 weeks, then 40 mg po qd thereafter indefinitely. 3.  No NSAIDs, but acetaminophen is ok. 4.  Follow CBC and clinical course. 5.  [facility] will revisit tomorrow; if doing ok, could probably be discharged home tomorrow from GI perspective, and can follow-up with [place] as outpatient.   Landry Dyke 08/15/2018, 1:13 PM   Cell  [phone] If no answer or after 5 PM call [phone]

## 2018-08-16 DIAGNOSIS — E43 Unspecified severe protein-calorie malnutrition: Secondary | ICD-10-CM

## 2018-08-16 LAB — TYPE AND SCREEN
ABO/RH(D): O POS
Antibody Screen: NEGATIVE
UNIT DIVISION: 0
UNIT DIVISION: 0
Unit division: 0
Unit division: 0
Unit division: 0
Unit division: 0
Unit division: 0

## 2018-08-16 LAB — BPAM RBC
BLOOD PRODUCT EXPIRATION DATE: 201911122359
BLOOD PRODUCT EXPIRATION DATE: 201911122359
BLOOD PRODUCT EXPIRATION DATE: 201911122359
BLOOD PRODUCT EXPIRATION DATE: 201911122359
Blood Product Expiration Date: 201911132359
Blood Product Expiration Date: 201911132359
Blood Product Expiration Date: 201911182359
ISSUE DATE / TIME: 201910152232
ISSUE DATE / TIME: 201910160907
ISSUE DATE / TIME: 201910161145
ISSUE DATE / TIME: 201910162103
ISSUE DATE / TIME: 201910170004
ISSUE DATE / TIME: 201910180855
ISSUE DATE / TIME: 201910160223
UNIT TYPE AND RH: 5100
UNIT TYPE AND RH: 5100
UNIT TYPE AND RH: 5100
UNIT TYPE AND RH: 5100
UNIT TYPE AND RH: 5100
Unit Type and Rh: 5100
Unit Type and Rh: 5100

## 2018-08-16 MED ORDER — PANTOPRAZOLE SODIUM 40 MG PO TBEC: 40.0000 mg | DELAYED_RELEASE_TABLET | Freq: Two times a day (BID) | ORAL | 0 refills | Status: DC

## 2018-08-16 NOTE — Progress Notes (Signed)
Reviewed discharge information with patient and caregiver. Answered all questions. Patient/caregiver able to teach back medications and reasons to contact MD/911. Patient verbalizes importance of PCP follow up appointment. Oxygen and rollator delivered to room. HH PT ordered.  Barbee Shropshire. Brigitte Pulse, RN

## 2018-08-16 NOTE — Discharge Summary (Signed)
Physician Discharge Summary  HORACE LUKAS WER:[id number] DOB: [birth year] DOA: 08/12/2018  PCP: Aura Dials, MD  Admit date: 08/12/2018 Discharge date: 08/16/2018  Admitted From: Home Disposition: Home  Recommendations for Outpatient Follow-up:  1. Follow up with PCP in 1-2 weeks, check hemoglobin as outpatient. 2. Follow-up with [facility] in 4 weeks. 3. Follow-up with oncologist as scheduled.  Home Health: None Equipment/Devices: Has oxygen at home  Discharge Condition: Fair CODE STATUS: Full code Diet recommendation: Regular, add supplements if recommended by dietitian.    Discharge Diagnoses:  Principal Problem:   Duodenal ulcer without hemorrhage or perforation and without obstruction   Active Problems:   Acute blood loss anemia    Acute on chronic respiratory failure with hypoxia (HCC)   SIRS (systemic inflammatory response syndrome) (HCC)   Non-small cell carcinoma of right lung, stage 3 (HCC)   Other fatigue   Hypotension   Symptomatic anemia   Pressure injury of skin   Protein-calorie malnutrition, severe  Brief narrative/HPI Please refer to admission H&P for details, in brief,77 year old male with history of adenocarcinoma of the right lung, skin cancer, hypertension, COPD presented with increasing shortness of breath and some productive cough.  He reports that he has noticed some dark stool thinks that it was due to the iron supplement he has been on recently.  Denies any hematemesis or bright red blood per rectum.  Reports he has been taking Advil 2 tablets every 4 hours for several months for some pain and discomfort.  Denies any headache, dizziness or syncope.  In the ED he was found to have hemoglobin of 5.1 (baseline around 11) and positive Hemoccult.  Patient was also hypertensive with systolic blood pressure of 86, tachycardic and tachypneic.  His WBC was 2.4 and platelets of 113, sodium of 144 and potassium of 3.6. Patient admitted to telemetry and with  2 unit PRBC given.  Hospital course  Principal Problem:  Symptomatic anemia/acute blood loss anemia  Systemic inflammatory response syndrome (SIRS) Received a total of 5 units PRBC during hospital stay.  Was placed on PPI drip.  Hemoglobin improved to 9.5 this morning.  Has not had dark stools since yesterday.  [facility] consult appreciated. EGD showing esophagitis, gastritis and nonbleeding duodenal ulcer measuring 4 mm in largest diameter.  This is likely the source of acute blood loss anemia caused by heavy NSAID use (was taking 2 tablets of Advil every 4 hours for pain and fever like symptoms for several months).  Patient strongly advised to avoid any form of NSAIDs and can take some Tylenol for pain if needed.  Patient will be discharged on oral Protonix 40 mg twice daily for 6 weeks followed by once a day indefinitely per GI recommendations.  Patient hemodynamically stable and can be discharged home with outpatient follow-up.  Patient instructed to call his PCP or return to the hospital if he has any symptoms of dizziness, near syncope/syncope, chest pain, shortness of breath, abdominal pain, hematemesis, melena or bright red blood per rectum.    Active Problems: Acute on chronic respiratory failure with hypoxia (HCC) Secondary to symptomatic anemia.    Required continuous oxygen 2-3 L.  Received Lasix post transfusion.  Uses oxygen as needed at home and I have instructed him to use it continuously for the next few days.    Non-small cell carcinoma of right lung, stage 3 (HCC) Currently getting systemic chemotherapy with Alimta every 3 weeks and has received 4 cycles of treatment.  Oncologist saw patient  while in the hospital.  Stage III sacral ulcer Wound care consult appreciated.    Nutrition consult  Severe protein calorie malnutrition ([place]) Nutritionist consulted prior to discharge.  SIRS with hypotension and tachycardia Secondary to blood loss anemia.  Blood  pressure medications were held, now stable and can be resumed upon discharge.  COPD Stable.  On intermittent home O2.  Also on low-dose oral prednisone and Symbicort inhaler which is continued. Pancytopenia Secondary to chemotherapy and acute blood loss anemia.  Noted for drop in platelets to the 50s.  Received 2 unit platelets and has improved.    Code Status : Full code  Family Communication  :  Daughter at bedside  Disposition Plan  : Home   Consults  : [facility]   Discharge Instructions   Allergies as of 08/16/2018      Reactions   Glycopyrrolate Other (See Comments)   Urinary retention   Aleve [naproxen] Rash      Medication List    STOP taking these medications   ibuprofen 200 MG tablet Commonly known as:  ADVIL,MOTRIN   methylphenidate 5 MG tablet Commonly known as:  RITALIN   ondansetron 8 MG tablet Commonly known as:  ZOFRAN   temazepam 15 MG capsule Commonly known as:  RESTORIL     TAKE these medications   ALPRAZolam 0.25 MG tablet Commonly known as:  XANAX Take 1 tablet (0.25 mg total) by mouth at bedtime.   B COMPLEX PO Take 1 capsule by mouth daily.   dexamethasone 4 MG tablet Commonly known as:  DECADRON Take 4 mg twice a day the day before, day of and day after chemo   dronabinol 2.5 MG capsule Commonly known as:  MARINOL Take 1 capsule (2.5 mg total) by mouth 2 (two) times daily before a meal.   FIRST-DUKES MOUTHWASH Susp Take 5 mLs by mouth QID.   folic acid 1 MG tablet Commonly known as:  FOLVITE Take 1 tablet (1 mg total) by mouth daily.   guaiFENesin 600 MG 12 hr tablet Commonly known as:  MUCINEX Take 1 tablet (600 mg total) by mouth 2 (two) times daily as needed. What changed:    how much to take  reasons to take this   oxyCODONE-acetaminophen 5-325 MG tablet Commonly known as:  PERCOCET/ROXICET Take 1 tablet by mouth every 8 (eight) hours as needed for severe pain.   pantoprazole 40 MG tablet Commonly known  as:  PROTONIX Take 1 tablet (40 mg total) by mouth 2 (two) times daily. 40 mg bid for 6 weeks then 40 mg once daily indefinitely.   predniSONE 10 MG tablet Commonly known as:  DELTASONE TAKE 1 TABLET BY MOUTH TWICE A DAY   prochlorperazine 10 MG tablet Commonly known as:  COMPAZINE Take 1 tablet (10 mg total) by mouth every 6 (six) hours as needed for nausea or vomiting.   SYMBICORT 160-4.5 MCG/ACT inhaler Generic drug:  budesonide-formoterol INHALE 2 PUFFS TWICE DAILY      Follow-up Information    Aura Dials, MD. Schedule an appointment as soon as possible for a visit in 1 week(s).   Specialty:  Family Medicine Contact information: [address] [address] [phone]        Arta Silence, MD Follow up in 4 week(s).   Specialty:  Gastroenterology Contact information: [address] [address] [phone]          Allergies  Allergen Reactions  . Glycopyrrolate  Other (See Comments)    Urinary retention  . Aleve [Naproxen] Rash      Procedures/Studies: Dg Chest 2 View  Result Date: 08/12/2018 CLINICAL DATA:  Increasing weakness and fatigue. EXAM: CHEST - 2 VIEW COMPARISON:  06/18/2018 CXR, chest CT 07/15/2018 FINDINGS: Confluent opacity with volume loss involving the right upper lung. Stable aerated appearance of the right lung base with compensatory hyperinflation of the left lung. Uncoiled appearance of the thoracic aorta. Top-normal heart size. Chronic atelectasis/consolidation along the posteromedial aspect of the right lung base. IMPRESSION: Chronic stable pulmonary consolidations in the right upper and lower lobes. Compensatory hyperinflation of the left lung unchanged. Electronically Signed   By: Ashley Royalty M.D.   On: 08/12/2018 17:25       Subjective:   Discharge Exam: Vitals:   08/16/18 0438 08/16/18 0755  BP: 136/83   Pulse: 88   Resp: 18   Temp: 98.2 F (36.8 C)   SpO2: 96% 95%    Vitals:   08/15/18 1944 08/15/18 2049 08/16/18 0438 08/16/18 0755  BP:  127/77 136/83   Pulse:  97 88   Resp:  18 18   Temp:  98.2 F (36.8 C) 98.2 F (36.8 C)   TempSrc:  Oral Oral   SpO2: 92% 95% 96% 95%    General: Elderly male, fatigued, not in distress HEENT: Temporal wasting, pallor present, moist mucosa, supple Chest: Clear bilaterally CVs: Normal S1-S2, no murmurs GI: Soft, nondistended, nontender Musculoskeletal: Warm, no edema     The results of significant diagnostics from this hospitalization (including imaging, microbiology, ancillary and laboratory) are listed below for reference.     Microbiology: Recent Results (from the past 240 hour(s))  MRSA PCR Screening     Status: None   Collection Time: 08/13/18  4:13 PM  Result Value Ref Range Status   MRSA by PCR NEGATIVE NEGATIVE Final    Comment:        The GeneXpert MRSA Assay (FDA approved for NASAL specimens only), is one component of a comprehensive MRSA colonization surveillance program. It is not intended to diagnose MRSA infection nor to guide or monitor treatment for MRSA infections. Performed at [facility], [place] [address], [place], [place] [address]      Labs: BNP (last 3 results) No results for input(s): BNP in the last 8760 hours. Basic Metabolic Panel: Recent Labs  Lab 08/12/18 1643 08/12/18 1658 08/13/18 0818  NA 148* 144 145  K 4.0 3.6 3.9  CL 112* 111 118*  CO2 27  --  22  GLUCOSE 120* 108* 137*  BUN 69* 68* 72*  CREATININE 1.03 0.90 0.94  CALCIUM 8.6*  --  7.3*   Liver Function Tests: Recent Labs  Lab 08/13/18 0818  AST 13*  ALT 10  ALKPHOS 36*  BILITOT 0.5  PROT 3.8*  ALBUMIN 2.0*   No results for input(s): LIPASE, AMYLASE in the last 168 hours. No results for input(s): AMMONIA in the last 168 hours. CBC: Recent Labs  Lab 08/12/18 2050 08/13/18 0818 08/13/18 1618 08/13/18 1937 08/14/18 0511 08/14/18 1448 08/15/18 0317  08/15/18 1420  WBC 2.4* 3.0*  3.0* 2.9*  --   --  3.5* 3.5*  --   HGB 5.1* 5.8*  5.9* 6.4* 6.6* 9.5* 8.4* 7.0* 9.2*  HCT 17.4* 18.3*  18.6* 20.8* 20.7* 29.7* 26.0* 21.7* 28.5*  MCV 106.1* 97.9  97.9 96.3  --   --  87.5 88.6  --   PLT 113* 85*  85* 57*  --   --  180 139*  --    Cardiac Enzymes: No results for input(s): CKTOTAL, CKMB, CKMBINDEX, TROPONINI in the last 168 hours. BNP: Invalid input(s): POCBNP CBG: Recent Labs  Lab 08/12/18 1725  GLUCAP 101*   D-Dimer No results for input(s): DDIMER in the last 72 hours. Hgb A1c No results for input(s): HGBA1C in the last 72 hours. Lipid Profile No results for input(s): CHOL, HDL, LDLCALC, TRIG, CHOLHDL, LDLDIRECT in the last 72 hours. Thyroid function studies No results for input(s): TSH, T4TOTAL, T3FREE, THYROIDAB in the last 72 hours.  Invalid input(s): FREET3 Anemia work up No results for input(s): VITAMINB12, FOLATE, FERRITIN, TIBC, IRON, RETICCTPCT in the last 72 hours. Urinalysis    Component Value Date/Time   COLORURINE YELLOW 08/12/2018 1643   APPEARANCEUR HAZY (A) 08/12/2018 1643   LABSPEC 1.015 08/12/2018 1643   PHURINE 5.0 08/12/2018 1643   GLUCOSEU NEGATIVE 08/12/2018 1643   HGBUR SMALL (A) 08/12/2018 1643   BILIRUBINUR NEGATIVE 08/12/2018 1643   KETONESUR NEGATIVE 08/12/2018 1643   PROTEINUR NEGATIVE 08/12/2018 1643   NITRITE NEGATIVE 08/12/2018 1643   LEUKOCYTESUR MODERATE (A) 08/12/2018 1643   Sepsis Labs Invalid input(s): PROCALCITONIN,  WBC,  LACTICIDVEN Microbiology Recent Results (from the past 240 hour(s))  MRSA PCR Screening     Status: None   Collection Time: 08/13/18  4:13 PM  Result Value Ref Range Status   MRSA by PCR NEGATIVE NEGATIVE Final    Comment:        The GeneXpert MRSA Assay (FDA approved for NASAL specimens only), is one component of a comprehensive MRSA colonization surveillance program. It is not intended to diagnose MRSA infection nor to guide or monitor treatment  for MRSA infections. Performed at [facility], [place] [address], [place], [place] [address]      Time coordinating discharge: 35 minutes  SIGNED:   Louellen Molder, MD  [facility] 08/16/2018, 10:17 AM Pager   If 7PM-7AM, please contact night-coverage [email or web address] Password [email or web address]

## 2018-08-16 NOTE — Progress Notes (Signed)
SATURATION QUALIFICATIONS: (This note is used to comply with regulatory documentation for home oxygen)  Patient Saturations on Room Air at Rest = 84%  Patient Saturations on Room Air while Ambulating = 80%  Patient Saturations on 2 Liters of oxygen while Ambulating = 91%  Please briefly explain why patient needs home oxygen: Patient desaturates below 90 at rest and while ambulating   [employer]. Brigitte Pulse, RN

## 2018-08-16 NOTE — Evaluation (Signed)
Physical Therapy Evaluation Patient Details Name: Gabriel [name] MRN: [id number] DOB: 1941-06-10 Today's Date: 08/16/2018   History of Present Illness  77 year old male with history of adenocarcinoma of the right lung, skin cancer, hypertension, COPD presented with increasing shortness of breath and some productive cough.  He was found to have hemoglobin of 5.1 and positive hemoccult.  Underwent EGD showing esophagitis, gastritis and nonbleeding duodenal ulcer  Clinical Impression  Patient presents with decreased mobility due to weakness, decreased balance, immobility from hospitalization and increased O2 requirement.  He will benefit from HHPT at d/c and family assistance.      Follow Up Recommendations Home health PT;Supervision/Assistance - 24 hour    Equipment Recommendations  Other (comment)(rollator walker)    Recommendations for Other Services       Precautions / Restrictions Precautions Precautions: Fall Restrictions Weight Bearing Restrictions: No      Mobility  Bed Mobility               General bed mobility comments: sitting EOB  Transfers Overall transfer level: Needs assistance Equipment used: Rolling walker (2 wheeled);4-wheeled walker Transfers: Sit to/from Stand Sit to Stand: Supervision         General transfer comment: cues for hand placement  Ambulation/Gait Ambulation/Gait assistance: Min guard;Supervision Gait Distance (Feet): 80 Feet(x 2) Assistive device: Rolling walker (2 wheeled);4-wheeled walker Gait Pattern/deviations: Step-through pattern;Decreased stride length;Trunk flexed     General Gait Details: initially with RW and to practice steps on 2L O2 SpO2 97%, but 4/4 dyspnea on stairs; with rollator to practice and determine appropriate DME with cues for technique Seated rest second bout for education on utilizing seat on rollator when fatigued  Stairs Stairs: Yes Stairs assistance: Min assist Stair Management: No rails Number  of Stairs: 3 General stair comments: with HHA and cues forwards with son present to visualize how to assist  Wheelchair Mobility    Modified Rankin (Stroke Patients Only)       Balance Overall balance assessment: Needs assistance   Sitting balance-Leahy Scale: Good     Standing balance support: Bilateral upper extremity supported Standing balance-Leahy Scale: Poor Standing balance comment: weak and UE support needed at this time                             Pertinent Vitals/Pain Pain Assessment: No/denies pain    Home Living Family/patient expects to be discharged to:: Private residence   Available Help at Discharge: Family(daughter) Type of Home: House(townhouse) Home Access: Stairs to enter Entrance Stairs-Rails: None Entrance Stairs-Number of Steps: 3 Home Layout: One level        Prior Function Level of Independence: Independent;Needs assistance   Gait / Transfers Assistance Needed: walked without devices  ADL's / Homemaking Assistance Needed: help with IADL's indep with BADL's        Hand Dominance        Extremity/Trunk Assessment   Upper Extremity Assessment Upper Extremity Assessment: Generalized weakness    Lower Extremity Assessment Lower Extremity Assessment: Generalized weakness    Cervical / Trunk Assessment Cervical / Trunk Assessment: Kyphotic  Communication   Communication: No difficulties  Cognition Arousal/Alertness: Awake/alert Behavior During Therapy: WFL for tasks assessed/performed Overall Cognitive Status: Within Functional Limits for tasks assessed  General Comments General comments (skin integrity, edema, etc.): son in room and assisting as needed    Exercises     Assessment/Plan    PT Assessment All further PT needs can be met in the next venue of care  PT Problem List         PT Treatment Interventions      PT Goals (Current goals can be found  in the Care Plan section)  Acute Rehab PT Goals PT Goal Formulation: All assessment and education complete, DC therapy    Frequency     Barriers to discharge        Co-evaluation               AM-PAC PT "6 Clicks" Daily Activity  Outcome Measure Difficulty turning over in bed (including adjusting bedclothes, sheets and blankets)?: A Little Difficulty moving from lying on back to sitting on the side of the bed? : A Little Difficulty sitting down on and standing up from a chair with arms (e.g., wheelchair, bedside commode, etc,.)?: A Little Help needed moving to and from a bed to chair (including a wheelchair)?: A Little Help needed walking in hospital room?: A Little Help needed climbing 3-5 steps with a railing? : A Lot 6 Click Score: 17    End of Session Equipment Utilized During Treatment: Gait belt;Oxygen Activity Tolerance: Patient limited by fatigue Patient left: in bed;with call bell/phone within reach;with family/visitor present   PT Visit Diagnosis: Muscle weakness (generalized) (M62.81);Difficulty in walking, not elsewhere classified (R26.2)    Time: 2330-0762 PT Time Calculation (min) (ACUTE ONLY): 29 min   Charges:   PT Evaluation $PT Eval Moderate Complexity: 1 Mod PT Treatments $Gait Training: 8-22 mins        Magda Kiel, PT Acute Rehabilitation Services [phone] 08/16/2018   Reginia Naas 08/16/2018, 1:13 PM

## 2018-08-16 NOTE — Discharge Instructions (Signed)
Peptic Ulcer A peptic ulcer is a painful sore in the lining of your esophagus, stomach, or the first part of your small intestine. You may have pain in the area between your chest and your belly button. The most common causes of an ulcer are:  An infection.  Using certain pain medicines too often or too much.  Follow these instructions at home:  Avoid alcohol.  Avoid caffeine.  Do not use any tobacco products. These include cigarettes, chewing tobacco, and e-cigarettes. If you need help quitting, ask your doctor.  Take over-the-counter and prescription medicines only as told by your doctor. Do not stop or change your medicines unless you talk with your doctor about it first.  Keep all follow-up visits as told by your doctor. This is important. Contact a doctor if:  You do not get better in 7 days after you start treatment.  You keep having an upset stomach (indigestion) or heartburn. Get help right away if:  You have sudden, sharp pain in your belly (abdomen).  You have lasting belly pain.  You have bloody poop (stool) or black, tarry poop.  You throw up (vomit) blood. It may look like coffee grounds.  You feel light-headed or feel like you may pass out (faint).  You get weak.  You get sweaty or feel sticky and cold to the touch (clammy). This information is not intended to replace advice given to you by your health care provider. Make sure you discuss any questions you have with your health care provider. Document Released: 01/09/2010 Document Revised: 02/29/2016 Document Reviewed: 07/16/2015 Elsevier Interactive Patient Education  [employer].

## 2018-08-16 NOTE — Care Management Note (Addendum)
Case Management Note  Patient Details  Name: Gabriel [name] MRN: [id number] Date of Birth: 1941/01/11  Subjective/Objective:   Symptomatic anemia, Esophagitis, gastritis, non bleeding ulcer                 Action/Plan: NCM spoke to pt at bedside. Offered choice for [facility], pt states he had [facility] in the past. Pt states he has oxygen with [facility], will need portable for home. He has neb machine at home. Contacted [facility] for Rollator and [place] for home.   1355 Pt received Rollator and port oxygen to room.  Expected Discharge Date:  08/16/18               Expected Discharge Plan:  [place]  In-House Referral:  NA  Discharge planning Services  CM Consult  Post Acute Care Choice:  Home Health Choice offered to:  Patient  DME Arranged:  Walker rolling with seat DME Agency:  [place]:  PT [facility] Agency:  [place]  Status of Service:  Completed, signed off  If discussed at [place] of Stay Meetings, dates discussed:    Additional Comments:  Erenest Rasher, RN 08/16/2018, 1:22 PM

## 2018-08-18 ENCOUNTER — Encounter (HOSPITAL_COMMUNITY): Payer: Self-pay | Admitting: Gastroenterology

## 2018-08-22 ENCOUNTER — Telehealth: Payer: Self-pay | Admitting: *Deleted

## 2018-08-22 NOTE — Telephone Encounter (Signed)
Pt called  LMOVM states Im feeling very weak and may not make it to my appt on Monday. Pt requesting to r/s 10/28 appt for lab/MD/infusion. Returned call to pt unable to reach. LMOVM for pt that I will not be cancelling his appt Monday 10/28 incase he is feeling better by Monday morning. Requested pt call back to discuss his weakness further.

## 2018-08-25 ENCOUNTER — Inpatient Hospital Stay: Payer: 59 | Admitting: Internal Medicine

## 2018-08-25 ENCOUNTER — Inpatient Hospital Stay: Payer: 59

## 2018-08-28 ENCOUNTER — Other Ambulatory Visit: Payer: Self-pay | Admitting: Internal Medicine

## 2018-09-02 ENCOUNTER — Telehealth: Payer: Self-pay | Admitting: *Deleted

## 2018-09-02 NOTE — Telephone Encounter (Signed)
Pt son Yonael calling with following concerns: Pt is short of breath and unable to ambulate without assistance due to balance, fatigue and difficulty breathing.   Son denied pt having fever, chills, night sweats, confusion, n/v or hallucinations.  Son asking if MD thinks it is time for hospice.   Pt was unable to come to 10/28 appt due to weakness.   Son states he is not able to get him in the car and bring him in to be evaluated.  Informed Ammon I will review with MD and call back with MD recommendations.

## 2018-09-02 NOTE — Telephone Encounter (Signed)
Called [place] and advised MD is recommending Hospice. We can make that referral and have RN come out to house if needed.  Gabriel [name] advised he will speak with pt and sister and call back with pt's decision.

## 2018-09-15 ENCOUNTER — Other Ambulatory Visit: Payer: Self-pay | Admitting: *Deleted

## 2018-09-15 ENCOUNTER — Inpatient Hospital Stay: Payer: 59

## 2018-09-15 ENCOUNTER — Telehealth: Payer: Self-pay | Admitting: *Deleted

## 2018-09-15 ENCOUNTER — Inpatient Hospital Stay: Payer: 59 | Attending: Oncology

## 2018-09-15 ENCOUNTER — Inpatient Hospital Stay: Payer: 59 | Admitting: Oncology

## 2018-09-15 MED ORDER — PANTOPRAZOLE SODIUM 40 MG PO TBEC: 40.0000 mg | DELAYED_RELEASE_TABLET | Freq: Two times a day (BID) | ORAL | 0 refills | Status: DC

## 2018-09-15 MED ORDER — PANTOPRAZOLE SODIUM 40 MG PO TBEC: 40.0000 mg | DELAYED_RELEASE_TABLET | Freq: Two times a day (BID) | ORAL | 0 refills | Status: AC

## 2018-09-15 NOTE — Telephone Encounter (Signed)
Called pt to check on status. Spoke with pt and his son, pt is still thinking about Hospice as an option. At this time he has not made any decisions. He was too weak for his appt today, will call back in a few days to discuss r/s appt.  Son requested refill on Protonix. Refill sent to pharmacy. No further concerns.

## 2018-09-19 ENCOUNTER — Telehealth: Payer: Self-pay | Admitting: *Deleted

## 2018-09-19 NOTE — Telephone Encounter (Signed)
Cassey (son) called request for hospice. Call placed to [facility], referral made. Spoke with Maxime regarding hospice and the process, gave him # to Hospice if needs to call.

## 2018-09-24 ENCOUNTER — Other Ambulatory Visit: Payer: Self-pay | Admitting: Internal Medicine

## 2018-09-24 ENCOUNTER — Telehealth: Payer: Self-pay | Admitting: Medical Oncology

## 2018-09-24 NOTE — Telephone Encounter (Signed)
Son, Amaar  called to tell us pt died Sunday 1124/18. Farzad expressed his appreciation for the care his dad received.

## 2018-09-25 NOTE — Telephone Encounter (Signed)
So sorry to hear. Do you know mode of death? Great guy   Thanks    SIGNATURE    Dr. Brand Males, M.D., F.C.C.P,  Pulmonary and Critical Care Medicine Staff Physician, [place] Director - Interstitial Lung Disease  Program  Pulmonary [place] at [place], [place], [address]  Pager: [phone], If no answer or between  15:00h - 7:00h: call [phone] Telephone: [phone]  7:48 AM 09/25/2018

## 2018-09-28 DEATH — deceased

## 2018-09-29 NOTE — Telephone Encounter (Signed)
Gabriel [name]  Please get a condolence card. I did leave a condolence message with the son  Thanks    SIGNATURE    Dr. Brand Males, M.D., F.C.C.P,  Pulmonary and Critical Care Medicine Staff Physician, [place] Director - Interstitial Lung Disease  Program  Pulmonary [place] at [place], [place], [address]  Pager: [phone], If no answer or between  15:00h - 7:00h: call [phone] Telephone: [phone]  11:47 AM 09/29/2018

## 2018-10-07 ENCOUNTER — Ambulatory Visit: Payer: 59 | Admitting: Oncology

## 2018-10-07 ENCOUNTER — Ambulatory Visit: Payer: 59

## 2018-10-07 ENCOUNTER — Other Ambulatory Visit: Payer: 59

## 2018-10-09 ENCOUNTER — Ambulatory Visit: Payer: 59

## 2018-10-09 ENCOUNTER — Ambulatory Visit: Payer: 59 | Admitting: Oncology

## 2018-10-09 ENCOUNTER — Other Ambulatory Visit: Payer: 59

## 2018-10-13 ENCOUNTER — Other Ambulatory Visit: Payer: Self-pay | Admitting: Internal Medicine

## 2018-10-27 ENCOUNTER — Ambulatory Visit: Payer: 59 | Admitting: Internal Medicine

## 2018-10-27 ENCOUNTER — Ambulatory Visit: Payer: 59

## 2018-10-27 ENCOUNTER — Other Ambulatory Visit: Payer: 59
